# Patient Record
Sex: Female | Born: 1940 | Race: White | Hispanic: No | Smoking: Never smoker
Health system: Southern US, Community
[De-identification: ages and names within clinical notes are randomized; demographics above are authoritative.]

## PROBLEM LIST (undated history)

## (undated) DIAGNOSIS — F319 Bipolar disorder, unspecified: Secondary | ICD-10-CM

## (undated) DIAGNOSIS — K219 Gastro-esophageal reflux disease without esophagitis: Secondary | ICD-10-CM

## (undated) DIAGNOSIS — J40 Bronchitis, not specified as acute or chronic: Secondary | ICD-10-CM

## (undated) DIAGNOSIS — M549 Dorsalgia, unspecified: Secondary | ICD-10-CM

## (undated) DIAGNOSIS — M1990 Unspecified osteoarthritis, unspecified site: Secondary | ICD-10-CM

## (undated) DIAGNOSIS — J449 Chronic obstructive pulmonary disease, unspecified: Secondary | ICD-10-CM

## (undated) DIAGNOSIS — K589 Irritable bowel syndrome without diarrhea: Secondary | ICD-10-CM

## (undated) DIAGNOSIS — G8929 Other chronic pain: Secondary | ICD-10-CM

## (undated) DIAGNOSIS — Z9689 Presence of other specified functional implants: Secondary | ICD-10-CM

## (undated) DIAGNOSIS — I10 Essential (primary) hypertension: Secondary | ICD-10-CM

## (undated) DIAGNOSIS — K449 Diaphragmatic hernia without obstruction or gangrene: Secondary | ICD-10-CM

## (undated) DIAGNOSIS — F0390 Unspecified dementia without behavioral disturbance: Secondary | ICD-10-CM

## (undated) HISTORY — PX: OTHER SURGICAL HISTORY: SHX169

## (undated) HISTORY — PX: PARTIAL HYSTERECTOMY: SHX80

## (undated) HISTORY — PX: MANDIBLE SURGERY: SHX707

---

## 2009-05-13 ENCOUNTER — Emergency Department (HOSPITAL_BASED_OUTPATIENT_CLINIC_OR_DEPARTMENT_OTHER): Admission: EM | Admit: 2009-05-13 | Discharge: 2009-05-13 | Payer: Self-pay | Admitting: Emergency Medicine

## 2009-05-13 ENCOUNTER — Ambulatory Visit: Payer: Self-pay | Admitting: Diagnostic Radiology

## 2009-08-31 ENCOUNTER — Ambulatory Visit: Payer: Self-pay | Admitting: Diagnostic Radiology

## 2009-08-31 ENCOUNTER — Emergency Department (HOSPITAL_BASED_OUTPATIENT_CLINIC_OR_DEPARTMENT_OTHER): Admission: EM | Admit: 2009-08-31 | Discharge: 2009-08-31 | Payer: Self-pay | Admitting: Emergency Medicine

## 2009-10-19 ENCOUNTER — Emergency Department (HOSPITAL_BASED_OUTPATIENT_CLINIC_OR_DEPARTMENT_OTHER): Admission: EM | Admit: 2009-10-19 | Discharge: 2009-10-19 | Payer: Self-pay | Admitting: Emergency Medicine

## 2010-02-09 ENCOUNTER — Emergency Department (HOSPITAL_BASED_OUTPATIENT_CLINIC_OR_DEPARTMENT_OTHER): Admission: EM | Admit: 2010-02-09 | Discharge: 2010-02-10 | Payer: Self-pay | Admitting: Emergency Medicine

## 2010-02-09 ENCOUNTER — Ambulatory Visit: Payer: Self-pay | Admitting: Diagnostic Radiology

## 2010-07-21 ENCOUNTER — Emergency Department (HOSPITAL_BASED_OUTPATIENT_CLINIC_OR_DEPARTMENT_OTHER)
Admission: EM | Admit: 2010-07-21 | Discharge: 2010-07-21 | Disposition: A | Discharge status: Home / Self Care | Payer: Medicare Other | Attending: Emergency Medicine | Admitting: Emergency Medicine

## 2010-07-21 ENCOUNTER — Emergency Department (INDEPENDENT_AMBULATORY_CARE_PROVIDER_SITE_OTHER): Payer: Medicare Other

## 2010-07-21 DIAGNOSIS — R112 Nausea with vomiting, unspecified: Secondary | ICD-10-CM | POA: Insufficient documentation

## 2010-07-21 DIAGNOSIS — K589 Irritable bowel syndrome without diarrhea: Secondary | ICD-10-CM | POA: Insufficient documentation

## 2010-07-21 DIAGNOSIS — E860 Dehydration: Secondary | ICD-10-CM | POA: Insufficient documentation

## 2010-07-21 DIAGNOSIS — I10 Essential (primary) hypertension: Secondary | ICD-10-CM | POA: Insufficient documentation

## 2010-07-21 DIAGNOSIS — F319 Bipolar disorder, unspecified: Secondary | ICD-10-CM | POA: Insufficient documentation

## 2010-07-21 DIAGNOSIS — F0390 Unspecified dementia without behavioral disturbance: Secondary | ICD-10-CM | POA: Insufficient documentation

## 2010-07-21 DIAGNOSIS — K219 Gastro-esophageal reflux disease without esophagitis: Secondary | ICD-10-CM | POA: Insufficient documentation

## 2010-07-21 LAB — URINALYSIS, ROUTINE W REFLEX MICROSCOPIC
Ketones, ur: NEGATIVE mg/dL
Protein, ur: NEGATIVE mg/dL
Urine Glucose, Fasting: NEGATIVE mg/dL

## 2010-07-21 LAB — BASIC METABOLIC PANEL
CO2: 26 mEq/L (ref 19–32)
Chloride: 103 mEq/L (ref 96–112)
Creatinine, Ser: 0.7 mg/dL (ref 0.4–1.2)
Glucose, Bld: 155 mg/dL — ABNORMAL HIGH (ref 70–99)

## 2010-07-21 LAB — URINE MICROSCOPIC-ADD ON

## 2010-07-29 ENCOUNTER — Emergency Department (HOSPITAL_BASED_OUTPATIENT_CLINIC_OR_DEPARTMENT_OTHER)
Admission: EM | Admit: 2010-07-29 | Discharge: 2010-07-29 | Disposition: A | Discharge status: Home / Self Care | Payer: Medicare Other | Attending: Emergency Medicine | Admitting: Emergency Medicine

## 2010-07-29 DIAGNOSIS — Z79899 Other long term (current) drug therapy: Secondary | ICD-10-CM | POA: Insufficient documentation

## 2010-07-29 DIAGNOSIS — K219 Gastro-esophageal reflux disease without esophagitis: Secondary | ICD-10-CM | POA: Insufficient documentation

## 2010-07-29 DIAGNOSIS — F319 Bipolar disorder, unspecified: Secondary | ICD-10-CM | POA: Insufficient documentation

## 2010-07-29 DIAGNOSIS — I10 Essential (primary) hypertension: Secondary | ICD-10-CM | POA: Insufficient documentation

## 2010-07-29 DIAGNOSIS — K5900 Constipation, unspecified: Secondary | ICD-10-CM | POA: Insufficient documentation

## 2010-07-29 DIAGNOSIS — F0390 Unspecified dementia without behavioral disturbance: Secondary | ICD-10-CM | POA: Insufficient documentation

## 2010-07-30 ENCOUNTER — Emergency Department (HOSPITAL_BASED_OUTPATIENT_CLINIC_OR_DEPARTMENT_OTHER): Payer: Medicare Other

## 2010-07-30 ENCOUNTER — Emergency Department (INDEPENDENT_AMBULATORY_CARE_PROVIDER_SITE_OTHER): Payer: Medicare Other

## 2010-07-30 ENCOUNTER — Emergency Department (HOSPITAL_BASED_OUTPATIENT_CLINIC_OR_DEPARTMENT_OTHER)
Admission: EM | Admit: 2010-07-30 | Discharge: 2010-07-30 | Disposition: A | Discharge status: Home / Self Care | Payer: Medicare Other | Attending: Emergency Medicine | Admitting: Emergency Medicine

## 2010-07-30 DIAGNOSIS — R142 Eructation: Secondary | ICD-10-CM | POA: Insufficient documentation

## 2010-07-30 DIAGNOSIS — R911 Solitary pulmonary nodule: Secondary | ICD-10-CM | POA: Insufficient documentation

## 2010-07-30 DIAGNOSIS — R10819 Abdominal tenderness, unspecified site: Secondary | ICD-10-CM | POA: Insufficient documentation

## 2010-07-30 DIAGNOSIS — I498 Other specified cardiac arrhythmias: Secondary | ICD-10-CM | POA: Insufficient documentation

## 2010-07-30 DIAGNOSIS — Z79899 Other long term (current) drug therapy: Secondary | ICD-10-CM | POA: Insufficient documentation

## 2010-07-30 DIAGNOSIS — R109 Unspecified abdominal pain: Secondary | ICD-10-CM

## 2010-07-30 DIAGNOSIS — K5900 Constipation, unspecified: Secondary | ICD-10-CM | POA: Insufficient documentation

## 2010-07-30 DIAGNOSIS — K219 Gastro-esophageal reflux disease without esophagitis: Secondary | ICD-10-CM | POA: Insufficient documentation

## 2010-07-30 DIAGNOSIS — I10 Essential (primary) hypertension: Secondary | ICD-10-CM | POA: Insufficient documentation

## 2010-07-30 DIAGNOSIS — E785 Hyperlipidemia, unspecified: Secondary | ICD-10-CM | POA: Insufficient documentation

## 2010-07-30 DIAGNOSIS — E119 Type 2 diabetes mellitus without complications: Secondary | ICD-10-CM | POA: Insufficient documentation

## 2010-07-30 DIAGNOSIS — F0390 Unspecified dementia without behavioral disturbance: Secondary | ICD-10-CM | POA: Insufficient documentation

## 2010-07-30 DIAGNOSIS — M1990 Unspecified osteoarthritis, unspecified site: Secondary | ICD-10-CM | POA: Insufficient documentation

## 2010-07-30 DIAGNOSIS — F3130 Bipolar disorder, current episode depressed, mild or moderate severity, unspecified: Secondary | ICD-10-CM | POA: Insufficient documentation

## 2010-07-30 DIAGNOSIS — R141 Gas pain: Secondary | ICD-10-CM | POA: Insufficient documentation

## 2010-07-30 DIAGNOSIS — R110 Nausea: Secondary | ICD-10-CM | POA: Insufficient documentation

## 2010-07-30 LAB — DIFFERENTIAL
Basophils Absolute: 0 10*3/uL (ref 0.0–0.1)
Basophils Relative: 0 % (ref 0–1)
Eosinophils Relative: 1 % (ref 0–5)
Lymphocytes Relative: 21 % (ref 12–46)
Lymphs Abs: 2.2 10*3/uL (ref 0.7–4.0)

## 2010-07-30 LAB — BASIC METABOLIC PANEL
BUN: 15 mg/dL (ref 6–23)
CO2: 24 mEq/L (ref 19–32)
CO2: 28 mEq/L (ref 19–32)
Calcium: 9.2 mg/dL (ref 8.4–10.5)
Chloride: 102 mEq/L (ref 96–112)
Creatinine, Ser: 0.8 mg/dL (ref 0.4–1.2)
GFR calc Af Amer: >60 mL/min (ref >60)
GFR calc Af Amer: >60 mL/min (ref >60)
GFR calc non Af Amer: >60 mL/min (ref >60)
Glucose, Bld: 105 mg/dL — ABNORMAL HIGH (ref 70–99)
Potassium: 4.8 mEq/L (ref 3.5–5.1)
Sodium: 138 mEq/L (ref 135–145)

## 2010-07-30 LAB — CBC
HCT: 38.7 % (ref 36.0–46.0)
Platelets: 268 10*3/uL (ref 150–400)
WBC: 10.2 10*3/uL (ref 4.0–10.5)

## 2010-08-15 LAB — COMPREHENSIVE METABOLIC PANEL
AST: 34 U/L (ref 0–37)
Alkaline Phosphatase: 95 U/L (ref 39–117)
BUN: 12 mg/dL (ref 6–23)
CO2: 31 mEq/L (ref 19–32)
Calcium: 9.4 mg/dL (ref 8.4–10.5)
Creatinine, Ser: 0.7 mg/dL (ref 0.4–1.2)
GFR calc Af Amer: >60 mL/min (ref >60)
Glucose, Bld: 94 mg/dL (ref 70–99)
Total Protein: 7.6 g/dL (ref 6.0–8.3)

## 2010-08-15 LAB — DIFFERENTIAL
Basophils Absolute: 0 10*3/uL (ref 0.0–0.1)
Basophils Relative: 0 % (ref 0–1)
Monocytes Absolute: 0.6 10*3/uL (ref 0.1–1.0)
Monocytes Relative: 10 % (ref 3–12)
Neutro Abs: 3.8 10*3/uL (ref 1.7–7.7)

## 2010-08-15 LAB — URINALYSIS, ROUTINE W REFLEX MICROSCOPIC
Nitrite: NEGATIVE
Protein, ur: NEGATIVE mg/dL

## 2010-08-15 LAB — CBC
MCV: 90.3 fL (ref 78.0–100.0)
RDW: 13.4 % (ref 11.5–15.5)

## 2010-08-30 ENCOUNTER — Emergency Department (INDEPENDENT_AMBULATORY_CARE_PROVIDER_SITE_OTHER): Payer: Medicare Other

## 2010-08-30 ENCOUNTER — Emergency Department (HOSPITAL_BASED_OUTPATIENT_CLINIC_OR_DEPARTMENT_OTHER)
Admission: EM | Admit: 2010-08-30 | Discharge: 2010-08-30 | Disposition: A | Discharge status: Home / Self Care | Payer: Medicare Other | Attending: Emergency Medicine | Admitting: Emergency Medicine

## 2010-08-30 DIAGNOSIS — Z79899 Other long term (current) drug therapy: Secondary | ICD-10-CM | POA: Insufficient documentation

## 2010-08-30 DIAGNOSIS — I517 Cardiomegaly: Secondary | ICD-10-CM | POA: Insufficient documentation

## 2010-08-30 DIAGNOSIS — F411 Generalized anxiety disorder: Secondary | ICD-10-CM

## 2010-08-30 DIAGNOSIS — I10 Essential (primary) hypertension: Secondary | ICD-10-CM | POA: Insufficient documentation

## 2010-08-30 DIAGNOSIS — R0602 Shortness of breath: Secondary | ICD-10-CM | POA: Insufficient documentation

## 2010-08-30 DIAGNOSIS — K589 Irritable bowel syndrome without diarrhea: Secondary | ICD-10-CM | POA: Insufficient documentation

## 2010-08-30 DIAGNOSIS — E119 Type 2 diabetes mellitus without complications: Secondary | ICD-10-CM | POA: Insufficient documentation

## 2010-08-30 DIAGNOSIS — G8929 Other chronic pain: Secondary | ICD-10-CM | POA: Insufficient documentation

## 2010-08-30 DIAGNOSIS — F319 Bipolar disorder, unspecified: Secondary | ICD-10-CM | POA: Insufficient documentation

## 2010-08-30 LAB — DIFFERENTIAL
Eosinophils Absolute: 0.2 10*3/uL (ref 0.0–0.7)
Monocytes Absolute: 0.8 10*3/uL (ref 0.1–1.0)
Monocytes Relative: 11 % (ref 3–12)
Neutro Abs: 3 10*3/uL (ref 1.7–7.7)
Neutrophils Relative %: 43 % (ref 43–77)

## 2010-08-30 LAB — CBC
Hemoglobin: 12.4 g/dL (ref 12.0–15.0)
MCV: 88.1 fL (ref 78.0–100.0)
Platelets: 218 10*3/uL (ref 150–400)
RBC: 4.11 MIL/uL (ref 3.87–5.11)
RDW: 13.4 % (ref 11.5–15.5)
WBC: 6.8 10*3/uL (ref 4.0–10.5)

## 2010-08-30 LAB — BASIC METABOLIC PANEL
BUN: 12 mg/dL (ref 6–23)
Creatinine, Ser: 0.7 mg/dL (ref 0.4–1.2)
GFR calc Af Amer: >60 mL/min (ref >60)
Sodium: 142 mEq/L (ref 135–145)

## 2010-12-25 ENCOUNTER — Encounter: Payer: Self-pay | Admitting: *Deleted

## 2010-12-25 ENCOUNTER — Emergency Department (INDEPENDENT_AMBULATORY_CARE_PROVIDER_SITE_OTHER): Payer: Medicare Other

## 2010-12-25 ENCOUNTER — Emergency Department (HOSPITAL_BASED_OUTPATIENT_CLINIC_OR_DEPARTMENT_OTHER)
Admission: EM | Admit: 2010-12-25 | Discharge: 2010-12-25 | Disposition: A | Discharge status: Home / Self Care | Payer: Medicare Other | Attending: Emergency Medicine | Admitting: Emergency Medicine

## 2010-12-25 DIAGNOSIS — I10 Essential (primary) hypertension: Secondary | ICD-10-CM

## 2010-12-25 DIAGNOSIS — F0390 Unspecified dementia without behavioral disturbance: Secondary | ICD-10-CM | POA: Insufficient documentation

## 2010-12-25 DIAGNOSIS — K589 Irritable bowel syndrome without diarrhea: Secondary | ICD-10-CM | POA: Insufficient documentation

## 2010-12-25 DIAGNOSIS — F319 Bipolar disorder, unspecified: Secondary | ICD-10-CM | POA: Insufficient documentation

## 2010-12-25 DIAGNOSIS — J40 Bronchitis, not specified as acute or chronic: Secondary | ICD-10-CM | POA: Insufficient documentation

## 2010-12-25 DIAGNOSIS — R062 Wheezing: Secondary | ICD-10-CM

## 2010-12-25 DIAGNOSIS — E119 Type 2 diabetes mellitus without complications: Secondary | ICD-10-CM | POA: Insufficient documentation

## 2010-12-25 DIAGNOSIS — R05 Cough: Secondary | ICD-10-CM

## 2010-12-25 DIAGNOSIS — R0602 Shortness of breath: Secondary | ICD-10-CM

## 2010-12-25 DIAGNOSIS — K219 Gastro-esophageal reflux disease without esophagitis: Secondary | ICD-10-CM | POA: Insufficient documentation

## 2010-12-25 HISTORY — DX: Bipolar disorder, unspecified: F31.9

## 2010-12-25 HISTORY — DX: Irritable bowel syndrome, unspecified: K58.9

## 2010-12-25 HISTORY — DX: Essential (primary) hypertension: I10

## 2010-12-25 HISTORY — DX: Gastro-esophageal reflux disease without esophagitis: K21.9

## 2010-12-25 HISTORY — DX: Diaphragmatic hernia without obstruction or gangrene: K44.9

## 2010-12-25 HISTORY — DX: Unspecified osteoarthritis, unspecified site: M19.90

## 2010-12-25 HISTORY — DX: Unspecified dementia, unspecified severity, without behavioral disturbance, psychotic disturbance, mood disturbance, and anxiety: F03.90

## 2010-12-25 MED ORDER — METHYLPREDNISOLONE SODIUM SUCC 125 MG IJ SOLR: 125.0000 mg | Freq: Once | INTRAMUSCULAR | Status: DC

## 2010-12-25 MED ORDER — PREDNISONE 20 MG PO TABS: 60.0000 mg | ORAL_TABLET | Freq: Every day | ORAL | Status: AC

## 2010-12-25 MED ORDER — AZITHROMYCIN 250 MG PO TABS: 250.0000 mg | ORAL_TABLET | Freq: Every day | ORAL | Status: DC

## 2010-12-25 MED ORDER — IPRATROPIUM BROMIDE 0.02 % IN SOLN
0.5000 mg | RESPIRATORY_TRACT | Status: DC
  Administered 2010-12-25: 0.5 mg via RESPIRATORY_TRACT

## 2010-12-25 MED ORDER — AZITHROMYCIN 250 MG PO TABS
500.0000 mg | ORAL_TABLET | Freq: Once | ORAL | Status: AC
  Administered 2010-12-25: 500 mg via ORAL

## 2010-12-25 MED ORDER — ALBUTEROL SULFATE (5 MG/ML) 0.5% IN NEBU
INHALATION_SOLUTION | RESPIRATORY_TRACT | Status: AC
  Administered 2010-12-25: 5 mg via RESPIRATORY_TRACT
  Filled 2010-12-25: qty 1

## 2010-12-25 MED ORDER — HYDROCORTISONE 2.5 % RE CREA: TOPICAL_CREAM | RECTAL | Status: AC

## 2010-12-25 MED ORDER — PREDNISONE 20 MG PO TABS
60.0000 mg | ORAL_TABLET | Freq: Once | ORAL | Status: AC
  Administered 2010-12-25: 60 mg via ORAL

## 2010-12-25 MED ORDER — IPRATROPIUM BROMIDE 0.02 % IN SOLN
RESPIRATORY_TRACT | Status: AC
  Administered 2010-12-25: 0.5 mg via RESPIRATORY_TRACT
  Filled 2010-12-25: qty 2.5

## 2010-12-25 MED ORDER — ALBUTEROL SULFATE (5 MG/ML) 0.5% IN NEBU
5.0000 mg | INHALATION_SOLUTION | RESPIRATORY_TRACT | Status: DC
  Administered 2010-12-25: 5 mg via RESPIRATORY_TRACT

## 2010-12-25 NOTE — ED Provider Notes (Signed)
History     Chief Complaint  Patient presents with  . Shortness of Breath   The history is provided by the patient.   Pt with long history of breathing problems, reports 2-3 days of increasing SOB, cough and wheezing, not improved with inhalers at assisted living facility. Given albuterol neb enroute with some improvement. No fever, minimal sputum production.  Past Medical History  Diagnosis Date  . Hypertension   . GERD (gastroesophageal reflux disease)   . Dementia   . IBS (irritable bowel syndrome)   . Arthritis   . Constipation   . Hiatal hernia   . Bipolar 1 disorder   . Diabetes mellitus     History reviewed. No pertinent past surgical history.  No family history on file.  History  Substance Use Topics  . Smoking status: Never Smoker   . Smokeless tobacco: Not on file  . Alcohol Use: No    OB History    Grav Para Term Preterm Abortions TAB SAB Ect Mult Living                  Review of Systems  All other systems reviewed and are negative.    Physical Exam  BP 147/73  Pulse 60  Temp(Src) 98.6 F (37 C) (Oral)  SpO2 100%  Physical Exam  Nursing note and vitals reviewed. Constitutional: She is oriented to person, place, and time. She appears well-developed and well-nourished.  HENT:  Head: Normocephalic and atraumatic.  Eyes: EOM are normal. Pupils are equal, round, and reactive to light.  Neck: Normal range of motion. Neck supple.  Cardiovascular: Normal rate, normal heart sounds and intact distal pulses.   Pulmonary/Chest: Effort normal. No respiratory distress. She has wheezes. She has no rales.  Abdominal: Bowel sounds are normal. She exhibits no distension. There is no tenderness.  Musculoskeletal: Normal range of motion. She exhibits no edema and no tenderness.  Neurological: She is alert and oriented to person, place, and time. No cranial nerve deficit.  Skin: Skin is warm and dry. No rash noted.  Psychiatric: She has a normal mood and  affect.    ED Course  Procedures  MDM Pt feeling much better and ready to go home. Asking for refill on hemorrhoid cream.       Charles B. Bernette Mayers, MD 12/25/10 [id number]

## 2010-12-25 NOTE — ED Notes (Signed)
Per EMS pt from [facility]. Pt alert and oriented. Pt c/o shortness of breath x 3 days. Productive cough with yellow sputum. Expiratory wheezing on EMS arrival. Albuterol 5 given PTA. IV 20G to left arm infiltrated.

## 2010-12-25 NOTE — ED Notes (Signed)
Called ptar to transfer [facility]

## 2011-01-07 ENCOUNTER — Other Ambulatory Visit: Payer: Self-pay

## 2011-01-07 ENCOUNTER — Emergency Department (HOSPITAL_BASED_OUTPATIENT_CLINIC_OR_DEPARTMENT_OTHER)
Admission: EM | Admit: 2011-01-07 | Discharge: 2011-01-07 | Disposition: A | Discharge status: Home / Self Care | Payer: Medicare Other | Attending: Emergency Medicine | Admitting: Emergency Medicine

## 2011-01-07 ENCOUNTER — Emergency Department (INDEPENDENT_AMBULATORY_CARE_PROVIDER_SITE_OTHER): Payer: Medicare Other

## 2011-01-07 ENCOUNTER — Encounter (HOSPITAL_BASED_OUTPATIENT_CLINIC_OR_DEPARTMENT_OTHER): Payer: Self-pay | Admitting: Student

## 2011-01-07 DIAGNOSIS — R0602 Shortness of breath: Secondary | ICD-10-CM

## 2011-01-07 DIAGNOSIS — R609 Edema, unspecified: Secondary | ICD-10-CM

## 2011-01-07 DIAGNOSIS — R748 Abnormal levels of other serum enzymes: Secondary | ICD-10-CM

## 2011-01-07 DIAGNOSIS — J40 Bronchitis, not specified as acute or chronic: Secondary | ICD-10-CM

## 2011-01-07 DIAGNOSIS — IMO0001 Reserved for inherently not codable concepts without codable children: Secondary | ICD-10-CM

## 2011-01-07 DIAGNOSIS — R0989 Other specified symptoms and signs involving the circulatory and respiratory systems: Secondary | ICD-10-CM

## 2011-01-07 DIAGNOSIS — R05 Cough: Secondary | ICD-10-CM

## 2011-01-07 DIAGNOSIS — R079 Chest pain, unspecified: Secondary | ICD-10-CM

## 2011-01-07 LAB — BASIC METABOLIC PANEL
BUN: 12 mg/dL (ref 6–23)
CO2: 24 mEq/L (ref 19–32)
Chloride: 96 mEq/L (ref 96–112)
Glucose, Bld: 109 mg/dL — ABNORMAL HIGH (ref 70–99)
Potassium: 4.5 mEq/L (ref 3.5–5.1)

## 2011-01-07 LAB — D-DIMER, QUANTITATIVE: D-Dimer, Quant: 0.77 ug/mL-FEU — ABNORMAL HIGH (ref 0.00–0.48)

## 2011-01-07 LAB — CBC
MCH: 29.4 pg (ref 26.0–34.0)
Platelets: 239 10*3/uL (ref 150–400)
RBC: 3.98 MIL/uL (ref 3.87–5.11)
WBC: 7.7 10*3/uL (ref 4.0–10.5)

## 2011-01-07 LAB — CARDIAC PANEL(CRET KIN+CKTOT+MB+TROPI)
CK, MB: 3.7 ng/mL (ref 0.3–4.0)
Relative Index: INVALID (ref 0.0–2.5)
Troponin I: <0.3 ng/mL (ref <0.30)

## 2011-01-07 LAB — PRO B NATRIURETIC PEPTIDE: Pro B Natriuretic peptide (BNP): 59 pg/mL (ref 0–125)

## 2011-01-07 MED ORDER — METHYLPREDNISOLONE SODIUM SUCC 125 MG IJ SOLR
125.0000 mg | Freq: Once | INTRAMUSCULAR | Status: AC
  Administered 2011-01-07: 125 mg via INTRAVENOUS
  Filled 2011-01-07: qty 2

## 2011-01-07 MED ORDER — IPRATROPIUM BROMIDE 0.02 % IN SOLN
0.5000 mg | Freq: Once | RESPIRATORY_TRACT | Status: AC
  Administered 2011-01-07: 0.5 mg via RESPIRATORY_TRACT
  Filled 2011-01-07: qty 2.5

## 2011-01-07 MED ORDER — IOHEXOL 350 MG/ML SOLN
80.0000 mL | Freq: Once | INTRAVENOUS | Status: AC | PRN
  Administered 2011-01-07: 80 mL via INTRAVENOUS

## 2011-01-07 MED ORDER — FUROSEMIDE 40 MG PO TABS: 40.0000 mg | ORAL_TABLET | Freq: Every day | ORAL | Status: DC

## 2011-01-07 MED ORDER — ALBUTEROL SULFATE (5 MG/ML) 0.5% IN NEBU
2.5000 mg | INHALATION_SOLUTION | Freq: Once | RESPIRATORY_TRACT | Status: AC
  Administered 2011-01-07: 2.5 mg via RESPIRATORY_TRACT
  Filled 2011-01-07: qty 0.5

## 2011-01-07 MED ORDER — FUROSEMIDE 10 MG/ML IJ SOLN
40.0000 mg | Freq: Once | INTRAMUSCULAR | Status: AC
  Administered 2011-01-07: 40 mg via INTRAVENOUS
  Filled 2011-01-07: qty 4

## 2011-01-07 MED ORDER — DIPHENHYDRAMINE HCL 50 MG/ML IJ SOLN
50.0000 mg | Freq: Once | INTRAMUSCULAR | Status: AC
  Administered 2011-01-07: 50 mg via INTRAVENOUS
  Filled 2011-01-07: qty 1

## 2011-01-07 MED ORDER — ALBUTEROL SULFATE HFA 108 (90 BASE) MCG/ACT IN AERS: 2.0000 | INHALATION_SPRAY | RESPIRATORY_TRACT | Status: DC | PRN

## 2011-01-07 NOTE — ED Notes (Signed)
Pt sts she thinks she "may be allergic to iodine and contrast".

## 2011-01-07 NOTE — ED Notes (Signed)
CT called and pt will be taken for procedure at 1530.

## 2011-01-07 NOTE — ED Notes (Signed)
Pt speaking in full sentences, expiratory wheezing improved. nad noted. Pt on bedside monitor.

## 2011-01-07 NOTE — ED Notes (Signed)
PTAR called for transport to [facility] 

## 2011-01-07 NOTE — ED Notes (Signed)
Pt is from [facility], pt c/o "tightness in chest and difficulty breathing".  Pt has audible wheeze, able to speak in complete sentences. nad noted at this time.

## 2011-01-07 NOTE — ED Provider Notes (Signed)
History     CSN: [id number] Arrival date & time: 01/07/2011  9:21 AM  Chief Complaint  Patient presents with  . Shortness of Breath   Patient is a 70 y.o. female presenting with shortness of breath. The history is provided by the patient.  Shortness of Breath  Episode onset: Symptoms started three days ago. The onset was gradual. The problem occurs continuously. The problem has been gradually worsening. The problem is moderate. The symptoms are relieved by beta-agonist inhalers. The symptoms are aggravated by a supine position. Associated symptoms include chest pressure, orthopnea, rhinorrhea, cough and shortness of breath. Pertinent negatives include no fever. The cough is non-productive. The rhinorrhea has been occurring frequently. The nasal discharge has a clear appearance. Urine output has been normal.  She has had post-nasal drainage. This morning she noted increased dyspnea while trying to eat breakfast. She has gotten slight, transient relief from using inhalers.  Past Medical History  Diagnosis Date  . Hypertension   . GERD (gastroesophageal reflux disease)   . Dementia   . IBS (irritable bowel syndrome)   . Arthritis   . Constipation   . Hiatal hernia   . Bipolar 1 disorder   . Diabetes mellitus     History reviewed. No pertinent past surgical history.  History reviewed. No pertinent family history.  History  Substance Use Topics  . Smoking status: Never Smoker   . Smokeless tobacco: Not on file  . Alcohol Use: No    OB History    Grav Para Term Preterm Abortions TAB SAB Ect Mult Living                  Review of Systems  Constitutional: Negative for fever.  HENT: Positive for rhinorrhea.   Respiratory: Positive for cough and shortness of breath.   Cardiovascular: Positive for orthopnea.  All other systems reviewed and are negative.    Physical Exam  BP 147/78  Pulse 62  Temp(Src) 98 F (36.7 C) (Oral)  Resp 22  Ht 5' (1.524 m)  Wt 183 lb (83.008  kg)  BMI 35.74 kg/m2  SpO2 100%  Physical Exam  Nursing note and vitals reviewed. Constitutional: She is oriented to person, place, and time. She appears well-developed and well-nourished.       She is mildly dyspneic.  HENT:  Head: Normocephalic and atraumatic.  Right Ear: External ear normal.  Left Ear: External ear normal.  Nose: Nose normal.  Mouth/Throat: Oropharynx is clear and moist.  Eyes: Conjunctivae and EOM are normal. Pupils are equal, round, and reactive to light. No scleral icterus.  Neck: Normal range of motion. Neck supple. No JVD present.       Expiratory wheeze heard over the thyroid cartilage.  Cardiovascular: Normal rate, regular rhythm and normal heart sounds.   No murmur heard. Pulmonary/Chest: She has no rales.       Transmitted upper airway wheeze, no prolonged exhalation  Abdominal: Soft. Bowel sounds are normal. She exhibits no mass. There is no tenderness.  Musculoskeletal: Normal range of motion. She exhibits edema.       2 pretibial edema.  Lymphadenopathy:    She has no cervical adenopathy.  Neurological: She is alert and oriented to person, place, and time. No cranial nerve deficit. Coordination normal.  Skin: Skin is warm and dry. No rash noted.  Psychiatric: She has a normal mood and affect.    ED Course  Procedures ECG shows normal sinus bradycardia with a rate of 53, no  ectopy. Normal axis. Normal P wave. Normal QRS. Normal intervals. Normal ST and T waves. Impression: normal ECG except for sinus bradycardia. ECG is unchanged from tracing 452012.  MDM Dyspnea more likely from CHF than bronchospasm She feels a little better after Albuterol/Atrovent NMT. BNP is normal, but still might have a component of fluid overload making mild bronchospasm more symptomatic. Will send home on Lasix. She states she has psychiatric side effects to Prednisone, so will not give steroids.  Labs and x-rays reviewed, images viewed by me.  Results for orders placed  during the hospital encounter of 01/07/11  CBC      Component Value Range   WBC 7.7  4.0 - 10.5 (K/uL)   RBC 3.98  3.87 - 5.11 (MIL/uL)   Hemoglobin 11.7 (*) 12.0 - 15.0 (g/dL)   HCT 46.9 (*) 62.9 - 46.0 (%)   MCV 88.2  78.0 - 100.0 (fL)   MCH 29.4  26.0 - 34.0 (pg)   MCHC 33.3  30.0 - 36.0 (g/dL)   RDW 52.8  41.3 - 24.4 (%)   Platelets 239  150 - 400 (K/uL)  BASIC METABOLIC PANEL      Component Value Range   Sodium 132 (*) 135 - 145 (mEq/L)   Potassium 4.5  3.5 - 5.1 (mEq/L)   Chloride 96  96 - 112 (mEq/L)   CO2 24  19 - 32 (mEq/L)   Glucose, Bld 109 (*) 70 - 99 (mg/dL)   BUN 12  6 - 23 (mg/dL)   Creatinine, Ser 0.10  0.50 - 1.10 (mg/dL)   Calcium 9.5  8.4 - 27.2 (mg/dL)   GFR calc non Af Amer >60  >60 (mL/min)   GFR calc Af Amer >60  >60 (mL/min)  CARDIAC PANEL(CRET KIN+CKTOT+MB+TROPI)      Component Value Range   Total CK 81  7 - 177 (U/L)   CK, MB 3.7  0.3 - 4.0 (ng/mL)   Troponin I <0.30  <0.30 (ng/mL)   Relative Index RELATIVE INDEX IS INVALID  0.0 - 2.5   D-DIMER, QUANTITATIVE      Component Value Range   D-Dimer, Quant 0.77 (*) 0.00 - 0.48 (ug/mL-FEU)  PRO B NATRIURETIC PEPTIDE      Component Value Range   BNP, POC 59.0  0 - 125 (pg/mL)   Dg Chest 2 View  01/07/2011  *RADIOLOGY REPORT*  Clinical Data: Shortness of breath and cough.  Chest pain.  CHEST - 2 VIEW  Comparison: 12/25/2010 and 05/13/2009  Findings: The cardiomediastinal silhouette is unremarkable. Elevation of the right hemidiaphragm is unchanged. A new nodular opacity overlying the mid - lower right lung is noted. There is no evidence of airspace disease, pleural effusion or pneumothorax. A neurostimulator generator pack overlying the left chest is again noted.  IMPRESSION: Nodular opacity overlying the mid - lower right lung.  This may be bony, but as this is new, chest CT with contrast is recommended for further evaluation.  Original Report Authenticated By: Rosendo Gros, M.D.   Dg Chest 2  View  12/25/2010  *RADIOLOGY REPORT*  Clinical Data: Shortness of breath.  Productive coughing. Wheezing.  History of hypertension.  Hiatal hernia.  Diabetes.  CHEST - 2 VIEW  Comparison: 08/30/2010.  Findings: The left-sided spinal stimulator device is present anteriorly on the left. Its lead extends into the left lower neck. There is moderate enlargement of the cardiac silhouette. Ectasia and nonaneurysmal calcification of the thoracic aorta are seen. There is chronic elevation of  the anterior aspect of the right hemidiaphragm.  No pulmonary edema, pneumonia, or pleural effusion is seen.  On the lateral image there appears to be some central peribronchial thickening.  There is osteopenic appearance of the bones with degenerative spondylosis.  IMPRESSION: Moderate enlargement of the cardiac silhouette.  Chronic elevation of anterior aspect of right hemidiaphragm.  No peripheral pneumonia is evident.  Central peribronchial thickening.  This may be associated with bronchitis, asthma, and reactive airway disease.  Original Report Authenticated By: Crawford Givens, M.D.   Ct Angio Chest W/cm &/or Wo Cm  01/07/2011  *RADIOLOGY REPORT*  Clinical Data:  Dyspnea, chest pain, elevated D-dimer, evaluate for PE  CT ANGIOGRAPHY CHEST WITH CONTRAST  Technique:  Multidetector CT imaging of the chest was performed using the standard protocol during bolus administration of intravenous contrast.  Multiplanar CT image reconstructions including MIPs were obtained to evaluate the vascular anatomy.  Contrast:  80 ml Omnipaque-300 IV  Comparison:  Chest radiograph dated 01/07/2011  Findings:  No evidence of pulmonary embolism.  Mild dependent atelectasis in the lower lobes.  Opacity on the chest radiograph likely corresponds to subsegmental atelectasis in the right middle lobe (series 6/image 40).  No suspicious pulmonary nodules. No pleural effusion or pneumothorax.  Visualized thyroid is unremarkable.  Heart is within normal limits.   Mild atherosclerotic calcifications of the aortic arch.  No suspicious mediastinal, hilar, or axillary lymphadenopathy.  Visualized upper abdomen is notable for a small hiatal hernia, vascular calcifications, and multiple fluid density lesions in the liver which are likely benign cysts.  Mild degenerative changes of the visualized thoracolumbar spine. Battery pack in the left chest wall, possibly for a stimulator device.  Review of the MIP images confirms the above findings.  IMPRESSION: No evidence of pulmonary embolism.  Opacity on the chest radiograph likely corresponds to subsegmental atelectasis in the right middle lobe.  No suspicious pulmonary nodules.  Original Report Authenticated By: Charline Bills, M.D.      Dione Booze, MD 01/07/11 [phone]

## 2011-01-07 NOTE — ED Notes (Signed)
Pt in via [facility] from [facility] with c/o SOB x 3 days. Airway patent and intact, per EMS report pt sat at scene 99% on RA. No cyanosis noted. RN staff at SNF reports cough and congestion. Pt able to mobilize secretions and maintain patent airway.

## 2011-01-21 ENCOUNTER — Other Ambulatory Visit: Payer: Self-pay

## 2011-01-21 ENCOUNTER — Emergency Department (HOSPITAL_BASED_OUTPATIENT_CLINIC_OR_DEPARTMENT_OTHER)
Admission: EM | Admit: 2011-01-21 | Discharge: 2011-01-21 | Disposition: A | Discharge status: Home / Self Care | Payer: Medicare Other | Attending: Emergency Medicine | Admitting: Emergency Medicine

## 2011-01-21 ENCOUNTER — Encounter (HOSPITAL_BASED_OUTPATIENT_CLINIC_OR_DEPARTMENT_OTHER): Payer: Self-pay | Admitting: Emergency Medicine

## 2011-01-21 ENCOUNTER — Emergency Department (INDEPENDENT_AMBULATORY_CARE_PROVIDER_SITE_OTHER): Payer: Medicare Other

## 2011-01-21 DIAGNOSIS — R0989 Other specified symptoms and signs involving the circulatory and respiratory systems: Secondary | ICD-10-CM | POA: Insufficient documentation

## 2011-01-21 DIAGNOSIS — R0602 Shortness of breath: Secondary | ICD-10-CM

## 2011-01-21 DIAGNOSIS — IMO0001 Reserved for inherently not codable concepts without codable children: Secondary | ICD-10-CM

## 2011-01-21 DIAGNOSIS — R05 Cough: Secondary | ICD-10-CM

## 2011-01-21 DIAGNOSIS — R0609 Other forms of dyspnea: Secondary | ICD-10-CM | POA: Insufficient documentation

## 2011-01-21 MED ORDER — GI COCKTAIL ~~LOC~~
30.0000 mL | Freq: Once | ORAL | Status: AC
  Administered 2011-01-21: 30 mL via ORAL
  Filled 2011-01-21: qty 30

## 2011-01-21 MED ORDER — IPRATROPIUM BROMIDE 0.02 % IN SOLN
0.5000 mg | Freq: Once | RESPIRATORY_TRACT | Status: AC
  Administered 2011-01-21: 0.5 mg via RESPIRATORY_TRACT
  Filled 2011-01-21: qty 2.5

## 2011-01-21 MED ORDER — BENZONATATE 100 MG PO CAPS: 100.0000 mg | ORAL_CAPSULE | Freq: Three times a day (TID) | ORAL | Status: AC

## 2011-01-21 MED ORDER — GI COCKTAIL ~~LOC~~: 30.0000 mL | Freq: Once | ORAL | Status: DC

## 2011-01-21 MED ORDER — ALBUTEROL SULFATE (5 MG/ML) 0.5% IN NEBU
5.0000 mg | INHALATION_SOLUTION | Freq: Once | RESPIRATORY_TRACT | Status: AC
  Administered 2011-01-21: 5 mg via RESPIRATORY_TRACT
  Filled 2011-01-21: qty 1

## 2011-01-21 NOTE — ED Notes (Signed)
PTAR here for pt transport back to [facility].

## 2011-01-21 NOTE — ED Notes (Signed)
Pt returned from radiology, NAD noted. 

## 2011-01-21 NOTE — ED Notes (Signed)
Sent from [facility] for sob.   Cough ,wheezing

## 2011-01-21 NOTE — ED Notes (Signed)
Pt report called and given to Ms Manson Passey at [facility].

## 2011-01-21 NOTE — ED Provider Notes (Signed)
Scribed for [employer]. Bernette Mayers, MD, the patient was seen in room 8 This chart was scribed by Jannette Fogo and Hillery Hunter. This patient's care was started at 19:41.   CSN: [id number] Arrival date & time: 01/21/2011  7:09 PM  Chief Complaint  Patient presents with  . Shortness of Breath    1 albuterol given by EMS PTA   HPI Cindy Padilla is a 70 y.o. female brought in by ambulance from [facility], who presents to the Emergency Department complaining of ~3 days of cough and shortness of breath. Patient states she developed a cough with "pale yellow" sputum 2 days ago. She reports associated shortness of breath, chest congestion, and chest tightness. Symptoms are exacerbated by coughing and states she "feels a tearing sensation in the chest". Patient took Albuterol today at 16:30PM and reports mild improvement.  Additionally, patient was seen in the ED on 01/07/11 by Dr. Preston Fleeting for dyspnea more likely from CHF than bronchospasm. She was treated with a 15 day course of Lasix and has 3 days left. Patient reports persistent lower extremity swelling despite being on Lasix but she also reports drinking a lot of water. There are no other associated symptoms and no other alleviating or aggravating factors.    HPI ELEMENTS:  Location: chest  Onset: 2 days ago Duration: 3 days  Quality: "tearing" sensation   Modifying factors: exacerbated by coughing   Context: as above  Associated symptoms: as above    Past Medical History  Diagnosis Date  . Hypertension   . GERD (gastroesophageal reflux disease)   . Dementia   . IBS (irritable bowel syndrome)   . Arthritis   . Constipation   . Hiatal hernia   . Bipolar 1 disorder   . Diabetes mellitus      PAST SURGICAL HISTORY:  History reviewed. No pertinent past surgical history.   MEDICATIONS:  Previous Medications   ACETAMINOPHEN (TYLENOL) 325 MG TABLET    Take 650 mg by mouth every 6 (six) hours as needed. Pain or fever     ALBUTEROL (PROVENTIL HFA;VENTOLIN HFA) 108 (90 BASE) MCG/ACT INHALER    Inhale 2 puffs into the lungs every 4 (four) hours as needed for wheezing.   ALBUTEROL (PROVENTIL,VENTOLIN) 90 MCG/ACT INHALER    Inhale 2 puffs into the lungs every 4 (four) hours as needed. Wheezing and shortness of breath    AZITHROMYCIN (ZITHROMAX Z-PAK) 250 MG TABLET    Take 1 tablet (250 mg total) by mouth daily.   CARVEDILOL (COREG) 6.25 MG TABLET    Take 6.25 mg by mouth 2 (two) times daily with a meal.     CHOLECALCIFEROL (VITAMIN D) 1000 UNITS CAPSULE    Take 1,000 Units by mouth daily.     DICYCLOMINE (BENTYL) 10 MG CAPSULE    Take 10 mg by mouth 2 (two) times daily.     DOCUSATE SODIUM (COLACE) 100 MG CAPSULE    Take 100 mg by mouth daily.     DONEPEZIL (ARICEPT) 10 MG TABLET    Take 10 mg by mouth at bedtime.     FLUTICASONE (FLONASE) 50 MCG/ACT NASAL SPRAY    Place 2 sprays into the nose daily.     FLUTICASONE (FLOVENT HFA) 220 MCG/ACT INHALER    Inhale 2 puffs into the lungs at bedtime.     FUROSEMIDE (LASIX) 40 MG TABLET    Take 1 tablet (40 mg total) by mouth daily.   HYDROCORTISONE 2.5 % OINTMENT  Apply topically as needed. Affected areas     LAMOTRIGINE (LAMICTAL) 100 MG TABLET    Take 100 mg by mouth daily.     LAMOTRIGINE (LAMICTAL) 100 MG TABLET    Take 200 mg by mouth every evening.     LISINOPRIL (PRINIVIL,ZESTRIL) 40 MG TABLET    Take 40 mg by mouth daily.     LORATADINE (CLARITIN) 10 MG TABLET    Take 10 mg by mouth daily.     LORAZEPAM (ATIVAN) 0.5 MG TABLET    Take 1 mg by mouth 3 (three) times daily.     LORAZEPAM (ATIVAN) 1 MG TABLET    Take 1 mg by mouth every 6 (six) hours as needed. anxiety    PANTOPRAZOLE (PROTONIX) 40 MG TABLET    Take 40 mg by mouth 2 (two) times daily.     POLYETHYLENE GLYCOL (MIRALAX / GLYCOLAX) PACKET    Take 17 g by mouth daily.     PRAVASTATIN (PRAVACHOL) 40 MG TABLET    Take 40 mg by mouth daily.     QUETIAPINE (SEROQUEL XR) 50 MG TB24    Take 100 mg by mouth  daily.     QUETIAPINE (SEROQUEL) 50 MG TABLET    Take 50 mg by mouth every morning.     TRAMADOL (ULTRAM) 50 MG TABLET    Take 50 mg by mouth every 6 (six) hours as needed. Pain     VERAPAMIL (CALAN-SR) 180 MG CR TABLET    Take 180 mg by mouth daily.       ALLERGIES:  Allergies as of 01/21/2011 - Review Complete 01/21/2011  Allergen Reaction Noted  . Aspirin  01/07/2011  . Codeine  01/07/2011  . Ivp dye (iodinated diagnostic agents)  01/07/2011  . Morphine and related  12/25/2010  . Neomycin  01/07/2011  . Penicillins  12/25/2010  . Promethazine  12/25/2010  . Sulfa antibiotics  12/25/2010  . Tetracyclines & related  12/25/2010     FAMILY HISTORY:  No Pertinent Family History   SOCIAL HISTORY: Brought in by ambulance from [facility]  History  Substance Use Topics  . Smoking status: Never Smoker   . Smokeless tobacco: Not on file  . Alcohol Use: No     Review of Systems  Respiratory: Positive for cough, chest tightness and shortness of breath.   Cardiovascular: Positive for leg swelling.  All other systems reviewed and are negative.    Physical Exam  SpO2 96%  Physical Exam  Constitutional: She is oriented to person, place, and time. She appears well-developed and well-nourished.  HENT:  Head: Normocephalic and atraumatic.  Mouth/Throat: Oropharynx is clear and moist.  Eyes: Conjunctivae are normal. Pupils are equal, round, and reactive to light.  Neck: Normal range of motion. Neck supple. No thyromegaly present.  Cardiovascular: Normal rate, regular rhythm, normal heart sounds and intact distal pulses.   No murmur heard. Pulmonary/Chest: Effort normal and breath sounds normal. No stridor. She has no wheezes.       Upper airway noises   Abdominal: Soft. Bowel sounds are normal. She exhibits no distension. There is no tenderness.  Musculoskeletal: Normal range of motion. She exhibits edema (mild edema to bilateral shins ). She exhibits no tenderness.    Neurological: She is alert and oriented to person, place, and time. Coordination normal.  Skin: Skin is warm and dry. No rash noted.  Psychiatric: She has a normal mood and affect.    ED Course  Procedures  OTHER DATA REVIEWED: Nursing notes, vital signs, and past medical records reviewed.   DIAGNOSTIC STUDIES: Oxygen Saturation is 96% on room air, normal by my interpretation.    RADIOLOGY:  CXR: 2 View; Interpreted by Radiologist and reviewed by me:  Comparison: 01/07/2011 CT  IMPRESSION: No acute cardiopulmonary process.  Original Report Authenticated By: Waneta Martins, M.D.   ED COURSE / COORDINATION OF CARE: 19:45  - Patient evaluated by ED physician; CXR and Albuterol ordered.  21:50. Ordered GI cocktail  MDM: Pt with chronic SOB with multiple ED visits for same. Had extensive workup about 2 weeks ago including CTA chest. Given Lasix then, but no improvement. Her wheezing is volitional with normal breath sounds when distracted. She is complaining of a burning in her chest, likely GERD related. No concern for ACS. Given GI cocktail. Advised to continue inhalers at [facility] [place] for cough and PCP followup.    Date: 01/21/2011  Rate: 79  Rhythm: normal sinus rhythm  QRS Axis: normal  Intervals: normal  ST/T Wave abnormalities: normal  Conduction Disutrbances:none  Narrative Interpretation:   Old EKG Reviewed: no significant changes   SCRIBE ATTESTATION: I personally performed the services described in the documentation, which were scribed in my presence. The recorded information has been reviewed and considered.   SHELDON,CHARLES B.        Charles B. Bernette Mayers, MD 01/21/11 2200

## 2011-01-21 NOTE — ED Notes (Signed)
Pt ambulated to restroom with assistance. NAD noted.

## 2011-01-21 NOTE — ED Notes (Signed)
D/C IV per RN , Cath intact

## 2011-01-21 NOTE — ED Notes (Signed)
ECG done and given to EDP for confirmation  

## 2011-01-21 NOTE — ED Notes (Signed)
Call placed for pt transport back to [facility]. Pt made aware.

## 2011-01-21 NOTE — ED Notes (Signed)
Report rec'd from S. Elwyn Reach, RN, assumed pt care at this time. RT at bs, neb tx in progress.

## 2011-01-31 ENCOUNTER — Encounter (HOSPITAL_BASED_OUTPATIENT_CLINIC_OR_DEPARTMENT_OTHER): Payer: Self-pay | Admitting: *Deleted

## 2011-01-31 ENCOUNTER — Emergency Department (INDEPENDENT_AMBULATORY_CARE_PROVIDER_SITE_OTHER): Payer: Medicare Other

## 2011-01-31 ENCOUNTER — Emergency Department (HOSPITAL_BASED_OUTPATIENT_CLINIC_OR_DEPARTMENT_OTHER)
Admission: EM | Admit: 2011-01-31 | Discharge: 2011-01-31 | Disposition: A | Discharge status: Home / Self Care | Payer: Medicare Other | Attending: Emergency Medicine | Admitting: Emergency Medicine

## 2011-01-31 ENCOUNTER — Other Ambulatory Visit: Payer: Self-pay

## 2011-01-31 DIAGNOSIS — F319 Bipolar disorder, unspecified: Secondary | ICD-10-CM | POA: Insufficient documentation

## 2011-01-31 DIAGNOSIS — J4489 Other specified chronic obstructive pulmonary disease: Secondary | ICD-10-CM | POA: Insufficient documentation

## 2011-01-31 DIAGNOSIS — F0390 Unspecified dementia without behavioral disturbance: Secondary | ICD-10-CM | POA: Insufficient documentation

## 2011-01-31 DIAGNOSIS — K219 Gastro-esophageal reflux disease without esophagitis: Secondary | ICD-10-CM | POA: Insufficient documentation

## 2011-01-31 DIAGNOSIS — I509 Heart failure, unspecified: Secondary | ICD-10-CM

## 2011-01-31 DIAGNOSIS — K5900 Constipation, unspecified: Secondary | ICD-10-CM | POA: Insufficient documentation

## 2011-01-31 DIAGNOSIS — R0602 Shortness of breath: Secondary | ICD-10-CM | POA: Insufficient documentation

## 2011-01-31 DIAGNOSIS — E119 Type 2 diabetes mellitus without complications: Secondary | ICD-10-CM | POA: Insufficient documentation

## 2011-01-31 DIAGNOSIS — J449 Chronic obstructive pulmonary disease, unspecified: Secondary | ICD-10-CM | POA: Insufficient documentation

## 2011-01-31 DIAGNOSIS — I10 Essential (primary) hypertension: Secondary | ICD-10-CM | POA: Insufficient documentation

## 2011-01-31 DIAGNOSIS — I517 Cardiomegaly: Secondary | ICD-10-CM

## 2011-01-31 DIAGNOSIS — R05 Cough: Secondary | ICD-10-CM

## 2011-01-31 HISTORY — DX: Dorsalgia, unspecified: M54.9

## 2011-01-31 HISTORY — DX: Bronchitis, not specified as acute or chronic: J40

## 2011-01-31 HISTORY — DX: Other chronic pain: G89.29

## 2011-01-31 LAB — DIFFERENTIAL
Basophils Absolute: 0 10*3/uL (ref 0.0–0.1)
Basophils Relative: 0 % (ref 0–1)
Lymphocytes Relative: 38 % (ref 12–46)
Neutro Abs: 3.2 10*3/uL (ref 1.7–7.7)

## 2011-01-31 LAB — COMPREHENSIVE METABOLIC PANEL
ALT: 15 U/L (ref 0–35)
AST: 23 U/L (ref 0–37)
Albumin: 3.9 g/dL (ref 3.5–5.2)
CO2: 27 mEq/L (ref 19–32)
Calcium: 9.8 mg/dL (ref 8.4–10.5)
Chloride: 99 mEq/L (ref 96–112)
Creatinine, Ser: 0.9 mg/dL (ref 0.50–1.10)
GFR calc non Af Amer: >60 mL/min (ref >60)
Sodium: 137 mEq/L (ref 135–145)

## 2011-01-31 LAB — CBC
MCHC: 33.7 g/dL (ref 30.0–36.0)
Platelets: 254 10*3/uL (ref 150–400)
RDW: 13.8 % (ref 11.5–15.5)
WBC: 6.8 10*3/uL (ref 4.0–10.5)

## 2011-01-31 MED ORDER — FUROSEMIDE 40 MG PO TABS: 40.0000 mg | ORAL_TABLET | Freq: Every day | ORAL | Status: DC

## 2011-01-31 MED ORDER — ALBUTEROL SULFATE HFA 108 (90 BASE) MCG/ACT IN AERS: 2.0000 | INHALATION_SPRAY | RESPIRATORY_TRACT | Status: DC | PRN

## 2011-01-31 MED ORDER — IPRATROPIUM BROMIDE 0.02 % IN SOLN
0.5000 mg | Freq: Once | RESPIRATORY_TRACT | Status: AC
  Administered 2011-01-31: 0.5 mg via RESPIRATORY_TRACT
  Filled 2011-01-31: qty 2.5

## 2011-01-31 MED ORDER — IPRATROPIUM BROMIDE HFA 17 MCG/ACT IN AERS: 2.0000 | INHALATION_SPRAY | Freq: Four times a day (QID) | RESPIRATORY_TRACT | Status: DC

## 2011-01-31 MED ORDER — ALBUTEROL SULFATE (5 MG/ML) 0.5% IN NEBU
5.0000 mg | INHALATION_SOLUTION | Freq: Once | RESPIRATORY_TRACT | Status: AC
  Administered 2011-01-31: 5 mg via RESPIRATORY_TRACT
  Filled 2011-01-31: qty 1

## 2011-01-31 NOTE — ED Notes (Signed)
Attempted to call report to SNF x2 without answer.

## 2011-01-31 NOTE — ED Provider Notes (Addendum)
History     CSN: [id number] Arrival date & time: 01/31/2011  4:05 PM  Chief Complaint  Patient presents with  . Hypertension    non-productive cough and edema lower extremitie   HPI Comments: The patient has a persistent cough. She has been on Lasix, and recently was on Tessalon perlels. She complains of cough and wheezing. She has an albuterol inhaler. Is on a number of different medications for depression and anxiety and hypertension. She also saw a proton except for esophageal reflux and pravastatin for high cholesterol. She lives in a nursing home, [facility].   Patient is a 70 y.o. female presenting with wheezing.  Wheezing  The current episode started more than 2 weeks ago. The problem occurs frequently. The problem has been unchanged. The problem is moderate. The symptoms are relieved by beta-agonist inhalers. The symptoms are aggravated by allergens (She says that aerosols bother her breathing.). Associated symptoms include shortness of breath and wheezing. Steroid use: Steroids steroids cause abnormal behavior. She has been behaving normally. Urine output has been normal. The last void occurred less than 6 hours ago. There were no sick contacts. Recent Medical Care: She has had several visits [facility] her shortness of breath.    Past Medical History  Diagnosis Date  . Hypertension   . GERD (gastroesophageal reflux disease)   . Dementia   . IBS (irritable bowel syndrome)   . Arthritis   . Constipation   . Hiatal hernia   . Bipolar 1 disorder   . Diabetes mellitus   . Bronchitis   . Chronic back pain     History reviewed. No pertinent past surgical history.  No family history on file.  History  Substance Use Topics  . Smoking status: Never Smoker   . Smokeless tobacco: Not on file  . Alcohol Use: No    OB History    Grav Para Term Preterm Abortions TAB SAB Ect Mult Living                  Review of Systems  Constitutional: Negative.    HENT: Negative.   Eyes: Negative.   Respiratory: Positive for shortness of breath and wheezing.   Cardiovascular: Positive for leg swelling.  Gastrointestinal: Negative.   Genitourinary: Negative.   Musculoskeletal: Negative.   Neurological: Negative.   Psychiatric/Behavioral: Negative.     Physical Exam  BP 183/73  Pulse 57  Temp(Src) 99.2 F (37.3 C) (Oral)  Resp 20  SpO2 98%  Physical Exam  Nursing note and vitals reviewed. Constitutional: She is oriented to person, place, and time. She appears well-developed and well-nourished. No distress.  HENT:  Head: Normocephalic and atraumatic.  Right Ear: External ear normal.  Mouth/Throat: Oropharynx is clear and moist.  Eyes: Conjunctivae and EOM are normal. Pupils are equal, round, and reactive to light.  Neck: Normal range of motion.  Cardiovascular: Normal rate and regular rhythm.   Pulmonary/Chest: Effort normal. Stridor: she has wheezing heard over the larynx. She has wheezes.  Abdominal: Soft. Bowel sounds are normal.  Musculoskeletal: Normal range of motion.  Neurological: She is alert and oriented to person, place, and time.       No sensory or motor deficits.  Skin: Skin is warm.       She has 2+ ankle edema.  Psychiatric: She has a normal mood and affect. Her behavior is normal.    ED Course  Procedures    Date: 01/31/2011  Rate: 54 Rhythm: sinus bradycardia  QRS Axis: normal  Intervals: normal  ST/T Wave abnormalities: normal  Conduction Disutrbances:none  Narrative Interpretation:  Sinus bradycardia, otherwise normal  Old EKG Reviewed: unchanged      Carleene Cooper III, MD 01/31/11 1801  Course in the ED: Patient was seen and had physical exam, which showed wheezing. Has been evaluated for nebulizer treatment was ordered. Laboratory tests showed slightly elevated B. natruretic peptide. Chest x-ray was unchanged, and showed no pneumonia minimal signs of congestive. I advised her to continue Lasix 40  mg per day she should use the albuterol inhaler 2 puffs 4 times a day and Atrovent inhaler 4 times a day. She should followup with her doctor at [facility].  Carleene Cooper III, MD 02/01/11 1032

## 2011-01-31 NOTE — ED Notes (Signed)
Patient is an independent resident at [facility] and was assessed by nurse today and found to have B/P of 220/104.  Sent here for further evaluation.  Patient also have a cough and swelling in her lower extremities.

## 2011-01-31 NOTE — ED Notes (Signed)
Pt ambulated to bathroom with minimal assistance.

## 2011-06-01 ENCOUNTER — Encounter (HOSPITAL_BASED_OUTPATIENT_CLINIC_OR_DEPARTMENT_OTHER): Payer: Self-pay

## 2011-06-01 ENCOUNTER — Emergency Department (HOSPITAL_BASED_OUTPATIENT_CLINIC_OR_DEPARTMENT_OTHER)
Admission: EM | Admit: 2011-06-01 | Discharge: 2011-06-01 | Disposition: A | Discharge status: Home / Self Care | Payer: Medicare Other | Attending: Emergency Medicine | Admitting: Emergency Medicine

## 2011-06-01 DIAGNOSIS — F319 Bipolar disorder, unspecified: Secondary | ICD-10-CM | POA: Insufficient documentation

## 2011-06-01 DIAGNOSIS — F419 Anxiety disorder, unspecified: Secondary | ICD-10-CM

## 2011-06-01 DIAGNOSIS — F411 Generalized anxiety disorder: Secondary | ICD-10-CM | POA: Insufficient documentation

## 2011-06-01 LAB — BASIC METABOLIC PANEL
Calcium: 9.8 mg/dL (ref 8.4–10.5)
GFR calc Af Amer: 73 mL/min — ABNORMAL LOW (ref >90)
GFR calc non Af Amer: 63 mL/min — ABNORMAL LOW (ref >90)
Potassium: 3.4 mEq/L — ABNORMAL LOW (ref 3.5–5.1)
Sodium: 139 mEq/L (ref 135–145)

## 2011-06-01 LAB — CBC
Hemoglobin: 12.5 g/dL (ref 12.0–15.0)
MCH: 28.6 pg (ref 26.0–34.0)
MCHC: 33.2 g/dL (ref 30.0–36.0)
Platelets: 259 10*3/uL (ref 150–400)
RBC: 4.37 MIL/uL (ref 3.87–5.11)

## 2011-06-01 LAB — DIFFERENTIAL
Basophils Relative: 0 % (ref 0–1)
Eosinophils Absolute: 0.1 10*3/uL (ref 0.0–0.7)
Lymphs Abs: 2.3 10*3/uL (ref 0.7–4.0)
Monocytes Relative: 11 % (ref 3–12)
Neutro Abs: 4.1 10*3/uL (ref 1.7–7.7)
Neutrophils Relative %: 56 % (ref 43–77)

## 2011-06-01 MED ORDER — LORAZEPAM 1 MG PO TABS
2.0000 mg | ORAL_TABLET | Freq: Once | ORAL | Status: AC
  Administered 2011-06-01: 2 mg via ORAL
  Filled 2011-06-01: qty 2

## 2011-06-01 NOTE — ED Notes (Signed)
Pt has had a recent change in anxiety medication.  She reports initially she received ativan TID and for the past week she receives it qd. States she can't sleep.

## 2011-06-01 NOTE — ED Notes (Signed)
Pt informed of plan of care and communications called for transport.

## 2011-06-01 NOTE — ED Notes (Signed)
Pt assistec to [facility] and returned to ED stretcher.  Pt transferred to hallway.

## 2011-06-01 NOTE — ED Provider Notes (Signed)
History   This chart was scribed for Nelia Shi, MD by Sofie Rower. The patient was seen in room MH09/MH09 and the patient's care was started at 3:39PM.    CSN: [id number]  Arrival date & time 06/01/11  1531   First MD Initiated Contact with Patient 06/01/11 1531      Chief Complaint  Patient presents with  . Anxiety    (Consider location/radiation/quality/duration/timing/severity/associated sxs/prior treatment) HPI  Cindy Padilla is a 71 y.o. female who presents to the Emergency Department complaining of moderate, constant anxiety onset today with associated symptoms of loss of sleep, dizziness. Pt is currently taking ativan. Pt is currently living in an assisted living home. Pt also complains of a fungus between her toes claiming, "it itches". Pt has an appointment, Jan. 10th at [facility] with "Arline Asp".  Past Medical History  Diagnosis Date  . Hypertension   . GERD (gastroesophageal reflux disease)   . Dementia   . IBS (irritable bowel syndrome)   . Arthritis   . Constipation   . Hiatal hernia   . Bipolar 1 disorder   . Diabetes mellitus   . Bronchitis   . Chronic back pain     History reviewed. No pertinent past surgical history.  No family history on file.  History  Substance Use Topics  . Smoking status: Never Smoker   . Smokeless tobacco: Not on file  . Alcohol Use: No    OB History    Grav Para Term Preterm Abortions TAB SAB Ect Mult Living                  Review of Systems  10 Systems reviewed and are negative for acute change except as noted in the HPI.   Allergies  Aspirin; Codeine; Ivp dye; Morphine and related; Neomycin; Penicillins; Promethazine; Sulfa antibiotics; and Tetracyclines & related  Home Medications   Current Outpatient Rx  Name Route Sig Dispense Refill  . ACETAMINOPHEN 325 MG PO TABS Oral Take 650 mg by mouth every 6 (six) hours as needed. Pain or fever     . ALBUTEROL SULFATE HFA 108 (90 BASE) MCG/ACT IN AERS  Inhalation Inhale 2 puffs into the lungs every 4 (four) hours as needed for wheezing. 1 Inhaler 0  . ALBUTEROL SULFATE HFA 108 (90 BASE) MCG/ACT IN AERS Inhalation Inhale 2 puffs into the lungs every 4 (four) hours as needed for wheezing. 1 Inhaler 0  . ALBUTEROL 90 MCG/ACT IN AERS Inhalation Inhale 2 puffs into the lungs every 4 (four) hours as needed. Wheezing and shortness of breath     . AZITHROMYCIN 250 MG PO TABS Oral Take 1 tablet (250 mg total) by mouth daily. 4 tablet 0  . CARVEDILOL 6.25 MG PO TABS Oral Take 6.25 mg by mouth 2 (two) times daily with a meal.      . VITAMIN D 1000 UNITS PO CAPS Oral Take 1,000 Units by mouth daily.      Marland Kitchen DICYCLOMINE HCL 10 MG PO CAPS Oral Take 10 mg by mouth 2 (two) times daily.      Marland Kitchen DOCUSATE SODIUM 100 MG PO CAPS Oral Take 100 mg by mouth daily.      . DONEPEZIL HCL 10 MG PO TABS Oral Take 10 mg by mouth at bedtime.      Marland Kitchen FLUTICASONE PROPIONATE 50 MCG/ACT NA SUSP Nasal Place 2 sprays into the nose daily.     Marland Kitchen FLUTICASONE PROPIONATE  HFA 220 MCG/ACT IN AERO Inhalation  Inhale 2 puffs into the lungs at bedtime.      . FUROSEMIDE 40 MG PO TABS Oral Take 1 tablet (40 mg total) by mouth daily. 15 tablet 0  . FUROSEMIDE 40 MG PO TABS Oral Take 1 tablet (40 mg total) by mouth daily. 30 tablet 0  . HYDROCORTISONE 2.5 % EX OINT Topical Apply topically as needed. Affected areas      . IPRATROPIUM BROMIDE HFA 17 MCG/ACT IN AERS Inhalation Inhale 2 puffs into the lungs every 6 (six) hours. 1 Inhaler 12  . LAMOTRIGINE 100 MG PO TABS Oral Take 100 mg by mouth daily.      Marland Kitchen LAMOTRIGINE 100 MG PO TABS Oral Take 200 mg by mouth every evening.      Marland Kitchen LISINOPRIL 40 MG PO TABS Oral Take 40 mg by mouth daily.      Marland Kitchen LORATADINE 10 MG PO TABS Oral Take 10 mg by mouth daily.      Marland Kitchen LORAZEPAM 0.5 MG PO TABS Oral Take 1 mg by mouth 3 (three) times daily.      Marland Kitchen LORAZEPAM 1 MG PO TABS Oral Take 1 mg by mouth every 6 (six) hours as needed. anxiety     . PANTOPRAZOLE SODIUM 40  MG PO TBEC Oral Take 40 mg by mouth 2 (two) times daily.      Marland Kitchen POLYETHYLENE GLYCOL 3350 PO PACK Oral Take 17 g by mouth daily.      Marland Kitchen PRAVASTATIN SODIUM 40 MG PO TABS Oral Take 40 mg by mouth daily.      . QUETIAPINE FUMARATE ER 50 MG PO TB24 Oral Take 100 mg by mouth daily.      . QUETIAPINE FUMARATE 50 MG PO TABS Oral Take 50 mg by mouth every morning.      Marland Kitchen TRAMADOL HCL 50 MG PO TABS Oral Take 50 mg by mouth every 6 (six) hours as needed. Pain      . VERAPAMIL HCL ER 180 MG PO TBCR Oral Take 180 mg by mouth daily.        BP 181/80  Pulse 83  Temp(Src) 97.7 F (36.5 C) (Oral)  Resp 20  Ht 5' (1.524 m)  Wt 187 lb (84.823 kg)  BMI 36.52 kg/m2  SpO2 100%  Physical Exam  Nursing note and vitals reviewed. Constitutional: She is oriented to person, place, and time. She appears well-developed and well-nourished. No distress.  HENT:  Head: Normocephalic and atraumatic.  Right Ear: External ear normal.  Left Ear: External ear normal.  Nose: Nose normal.  Eyes: Conjunctivae and EOM are normal. Pupils are equal, round, and reactive to light. Right eye exhibits no discharge. Left eye exhibits no discharge.  Neck: Normal range of motion. Neck supple. No tracheal deviation present. No thyromegaly present.  Cardiovascular: Normal rate, regular rhythm and intact distal pulses.   Pulmonary/Chest: Effort normal and breath sounds normal. No respiratory distress.  Abdominal: Soft. Bowel sounds are normal. She exhibits no distension. There is no tenderness. There is no rebound.  Musculoskeletal: Normal range of motion. She exhibits no edema.  Neurological: She is alert and oriented to person, place, and time. No sensory deficit.  Skin: Skin is warm and dry.  Psychiatric: She has a normal mood and affect. Her behavior is normal.       Manic. Has flat ive ideas.    ED Course  Procedures (including critical care time)  DIAGNOSTIC STUDIES: Oxygen Saturation is 100% on room air, normal by my  interpretation.    COORDINATION OF CARE:    Results for orders placed during the hospital encounter of 06/01/11  BASIC METABOLIC PANEL      Component Value Range   Sodium 139  135 - 145 (mEq/L)   Potassium 3.4 (*) 3.5 - 5.1 (mEq/L)   Chloride 97  96 - 112 (mEq/L)   CO2 29  19 - 32 (mEq/L)   Glucose, Bld 112 (*) 70 - 99 (mg/dL)   BUN 16  6 - 23 (mg/dL)   Creatinine, Ser 4.09  0.50 - 1.10 (mg/dL)   Calcium 9.8  8.4 - 81.1 (mg/dL)   GFR calc non Af Amer 63 (*) >90 (mL/min)   GFR calc Af Amer 73 (*) >90 (mL/min)  CBC      Component Value Range   WBC 7.3  4.0 - 10.5 (K/uL)   RBC 4.37  3.87 - 5.11 (MIL/uL)   Hemoglobin 12.5  12.0 - 15.0 (g/dL)   HCT 91.4  78.2 - 95.6 (%)   MCV 86.0  78.0 - 100.0 (fL)   MCH 28.6  26.0 - 34.0 (pg)   MCHC 33.2  30.0 - 36.0 (g/dL)   RDW 21.3  08.6 - 57.8 (%)   Platelets 259  150 - 400 (K/uL)  DIFFERENTIAL      Component Value Range   Neutrophils Relative 56  43 - 77 (%)   Neutro Abs 4.1  1.7 - 7.7 (K/uL)   Lymphocytes Relative 31  12 - 46 (%)   Lymphs Abs 2.3  0.7 - 4.0 (K/uL)   Monocytes Relative 11  3 - 12 (%)   Monocytes Absolute 0.8  0.1 - 1.0 (K/uL)   Eosinophils Relative 1  0 - 5 (%)   Eosinophils Absolute 0.1  0.0 - 0.7 (K/uL)   Basophils Relative 0  0 - 1 (%)   Basophils Absolute 0.0  0.0 - 0.1 (K/uL)   No results found.  Diagnosis:  Anxiety   MDM  Medical screening examination/treatment/procedure(s) were performed by non-physician practitioner and as supervising physician I was immediately available for consultation/collaboration.        Nelia Shi, MD 06/02/11 1140

## 2011-06-18 ENCOUNTER — Emergency Department (HOSPITAL_BASED_OUTPATIENT_CLINIC_OR_DEPARTMENT_OTHER)
Admission: EM | Admit: 2011-06-18 | Discharge: 2011-06-19 | Disposition: A | Discharge status: Home / Self Care | Payer: Medicare Other | Attending: Emergency Medicine | Admitting: Emergency Medicine

## 2011-06-18 DIAGNOSIS — Z79899 Other long term (current) drug therapy: Secondary | ICD-10-CM | POA: Insufficient documentation

## 2011-06-18 DIAGNOSIS — G8929 Other chronic pain: Secondary | ICD-10-CM | POA: Insufficient documentation

## 2011-06-18 DIAGNOSIS — K219 Gastro-esophageal reflux disease without esophagitis: Secondary | ICD-10-CM | POA: Insufficient documentation

## 2011-06-18 DIAGNOSIS — K644 Residual hemorrhoidal skin tags: Secondary | ICD-10-CM

## 2011-06-18 DIAGNOSIS — K589 Irritable bowel syndrome without diarrhea: Secondary | ICD-10-CM | POA: Insufficient documentation

## 2011-06-18 DIAGNOSIS — F319 Bipolar disorder, unspecified: Secondary | ICD-10-CM | POA: Insufficient documentation

## 2011-06-18 DIAGNOSIS — I10 Essential (primary) hypertension: Secondary | ICD-10-CM | POA: Insufficient documentation

## 2011-06-19 ENCOUNTER — Encounter (HOSPITAL_BASED_OUTPATIENT_CLINIC_OR_DEPARTMENT_OTHER): Payer: Self-pay

## 2011-06-19 LAB — CBC
Hemoglobin: 11.2 g/dL — ABNORMAL LOW (ref 12.0–15.0)
MCH: 28.8 pg (ref 26.0–34.0)
Platelets: 241 10*3/uL (ref 150–400)
RBC: 3.89 MIL/uL (ref 3.87–5.11)
WBC: 8.1 10*3/uL (ref 4.0–10.5)

## 2011-06-19 NOTE — ED Notes (Signed)
Pt states having  Problems with bowel movement,having problems urinating

## 2011-06-19 NOTE — ED Provider Notes (Signed)
History     CSN: [id number]  Arrival date & time 06/18/11  2353   First MD Initiated Contact with Patient 06/19/11 0022      Chief Complaint  Patient presents with  . Constipation    (Consider location/radiation/quality/duration/timing/severity/associated sxs/prior treatment) HPI Comments: Patient presents today complaining of constipation.  She states her last bowel movement was yesterday.  She does note that MiraLAX works well for her and has been given to her at her nursing care facility.  Notably patient appears to have some psychiatric illness and has some flight of ideas during my discussion with her but she does remain calm.  Patient notes that her stools appear soft but complains that she does not really she's had interval dose of MiraLAX at the nursing care facility.  She has not noted any current bleeding but does note a history of hemorrhoids that have had some bleeding previously.  Patient denies any urinary symptoms to me.  She does note some small pustules on her right and left labia which intermittently appear to have drained.  She has no fevers or abdominal pain.  No nausea or vomiting.  Patient is a 71 y.o. female presenting with constipation. The history is provided by the patient. No language interpreter was used.  Constipation  The current episode started today. The problem occurs frequently. The patient is experiencing no pain. The stool is described as soft. Prior successful therapies include stool softeners. Associated symptoms include hemorrhoids. Pertinent negatives include no anorexia, no fever, no abdominal pain, no diarrhea, no hematemesis, no nausea, no rectal pain, no vomiting, no hematuria, no vaginal bleeding, no vaginal discharge, no chest pain, no headaches, no coughing, no difficulty breathing and no rash.    Past Medical History  Diagnosis Date  . Hypertension   . GERD (gastroesophageal reflux disease)   . Dementia   . IBS (irritable bowel syndrome)   .  Arthritis   . Constipation   . Hiatal hernia   . Bipolar 1 disorder   . Diabetes mellitus   . Bronchitis   . Chronic back pain     History reviewed. No pertinent past surgical history.  History reviewed. No pertinent family history.  History  Substance Use Topics  . Smoking status: Never Smoker   . Smokeless tobacco: Not on file  . Alcohol Use: No    OB History    Grav Para Term Preterm Abortions TAB SAB Ect Mult Living                  Review of Systems  Constitutional: Negative.  Negative for fever and chills.  HENT: Negative.   Eyes: Negative.  Negative for discharge and redness.  Respiratory: Negative.  Negative for cough and shortness of breath.   Cardiovascular: Negative.  Negative for chest pain.  Gastrointestinal: Positive for constipation and hemorrhoids. Negative for nausea, vomiting, abdominal pain, diarrhea, rectal pain, anorexia and hematemesis.  Genitourinary: Negative for dysuria, hematuria, vaginal bleeding and vaginal discharge.  Musculoskeletal: Negative.  Negative for back pain.  Skin: Negative.  Negative for color change and rash.  Neurological: Negative.  Negative for syncope and headaches.  Hematological: Negative.  Negative for adenopathy.  Psychiatric/Behavioral: Negative for confusion.  All other systems reviewed and are negative.    Allergies  Aspirin; Codeine; Ivp dye; Morphine and related; Neomycin; Penicillins; Promethazine; Sulfa antibiotics; and Tetracyclines & related  Home Medications   Current Outpatient Rx  Name Route Sig Dispense Refill  . ACETAMINOPHEN 325 MG PO  TABS Oral Take 650 mg by mouth every 6 (six) hours as needed. Pain or fever     . ALBUTEROL SULFATE HFA 108 (90 BASE) MCG/ACT IN AERS Inhalation Inhale 2 puffs into the lungs every 4 (four) hours as needed for wheezing. 1 Inhaler 0  . ALBUTEROL SULFATE HFA 108 (90 BASE) MCG/ACT IN AERS Inhalation Inhale 2 puffs into the lungs every 4 (four) hours as needed for wheezing.  1 Inhaler 0  . ALBUTEROL 90 MCG/ACT IN AERS Inhalation Inhale 2 puffs into the lungs every 4 (four) hours as needed. Wheezing and shortness of breath     . AZITHROMYCIN 250 MG PO TABS Oral Take 1 tablet (250 mg total) by mouth daily. 4 tablet 0  . CARVEDILOL 6.25 MG PO TABS Oral Take 6.25 mg by mouth 2 (two) times daily with a meal.      . VITAMIN D 1000 UNITS PO CAPS Oral Take 1,000 Units by mouth daily.      Marland Kitchen DICYCLOMINE HCL 10 MG PO CAPS Oral Take 10 mg by mouth 2 (two) times daily.      Marland Kitchen DOCUSATE SODIUM 100 MG PO CAPS Oral Take 100 mg by mouth daily.      . DONEPEZIL HCL 10 MG PO TABS Oral Take 10 mg by mouth at bedtime.      Marland Kitchen FLUTICASONE PROPIONATE 50 MCG/ACT NA SUSP Nasal Place 2 sprays into the nose daily.     Marland Kitchen FLUTICASONE PROPIONATE  HFA 220 MCG/ACT IN AERO Inhalation Inhale 2 puffs into the lungs at bedtime.      . FUROSEMIDE 40 MG PO TABS Oral Take 1 tablet (40 mg total) by mouth daily. 15 tablet 0  . FUROSEMIDE 40 MG PO TABS Oral Take 1 tablet (40 mg total) by mouth daily. 30 tablet 0  . HYDROCORTISONE 2.5 % EX OINT Topical Apply topically as needed. Affected areas      . IPRATROPIUM BROMIDE HFA 17 MCG/ACT IN AERS Inhalation Inhale 2 puffs into the lungs every 6 (six) hours. 1 Inhaler 12  . LAMOTRIGINE 100 MG PO TABS Oral Take 100 mg by mouth daily.      Marland Kitchen LAMOTRIGINE 100 MG PO TABS Oral Take 200 mg by mouth every evening.      Marland Kitchen LISINOPRIL 40 MG PO TABS Oral Take 40 mg by mouth daily.      Marland Kitchen LORATADINE 10 MG PO TABS Oral Take 10 mg by mouth daily.      Marland Kitchen LORAZEPAM 0.5 MG PO TABS Oral Take 1 mg by mouth 3 (three) times daily.      Marland Kitchen LORAZEPAM 1 MG PO TABS Oral Take 1 mg by mouth every 6 (six) hours as needed. anxiety     . PANTOPRAZOLE SODIUM 40 MG PO TBEC Oral Take 40 mg by mouth 2 (two) times daily.      Marland Kitchen POLYETHYLENE GLYCOL 3350 PO PACK Oral Take 17 g by mouth daily.      Marland Kitchen PRAVASTATIN SODIUM 40 MG PO TABS Oral Take 40 mg by mouth daily.      . QUETIAPINE FUMARATE ER 50 MG  PO TB24 Oral Take 100 mg by mouth daily.      . QUETIAPINE FUMARATE 50 MG PO TABS Oral Take 50 mg by mouth every morning.      Marland Kitchen TRAMADOL HCL 50 MG PO TABS Oral Take 50 mg by mouth every 6 (six) hours as needed. Pain      . VERAPAMIL HCL ER  180 MG PO TBCR Oral Take 180 mg by mouth daily.        BP 187/69  Pulse 69  Temp(Src) 98.4 F (36.9 C) (Oral)  Ht 5\' 1"  (1.549 m)  Wt 181 lb (82.101 kg)  BMI 34.20 kg/m2  SpO2 100%  Physical Exam  Nursing note and vitals reviewed. Constitutional: She is oriented to person, place, and time. She appears well-developed and well-nourished.  Non-toxic appearance. She does not have a sickly appearance.  HENT:  Head: Normocephalic and atraumatic.  Eyes: Conjunctivae, EOM and lids are normal. Pupils are equal, round, and reactive to light. No scleral icterus.  Neck: Trachea normal and normal range of motion. Neck supple.  Cardiovascular: Normal rate, regular rhythm and normal heart sounds.   Pulmonary/Chest: Effort normal and breath sounds normal.  Abdominal: Soft. Normal appearance. She exhibits no distension. There is no tenderness. There is no rebound, no guarding and no CVA tenderness.  Genitourinary: Guaiac negative stool.       Patient with no significant stool noted in the rectal vault.  No signs of bleeding or melena.  There is one external hemorrhoid present.  Examination of the patient's labia does demonstrate 3 very small pustules that are approximately 1-2 mm in size.  There is no palpable induration or fluid collection.    Musculoskeletal: Normal range of motion.  Neurological: She is alert and oriented to person, place, and time. She has normal strength.  Skin: Skin is warm, dry and intact. No rash noted.  Psychiatric:       Patient has remained calm here but does have flight of ideas during my conversation with her needs to be frequently redirected to attempt to answer the question I've asked her.    ED Course  Procedures (including  critical care time)  Results for orders placed during the hospital encounter of 06/18/11  CBC      Component Value Range   WBC 8.1  4.0 - 10.5 (K/uL)   RBC 3.89  3.87 - 5.11 (MIL/uL)   Hemoglobin 11.2 (*) 12.0 - 15.0 (g/dL)   HCT 09.8 (*) 11.9 - 46.0 (%)   MCV 86.6  78.0 - 100.0 (fL)   MCH 28.8  26.0 - 34.0 (pg)   MCHC 33.2  30.0 - 36.0 (g/dL)   RDW 14.7  82.9 - 56.2 (%)   Platelets 241  150 - 400 (K/uL)       MDM  Given the patient's difficulty in being able to give a history at the check hemoglobin to make sure the patient is not anemic.  Her hemoglobin is 11 and so does not require further intervention.  Patient is not acutely seem to have any signs of bleeding at this time.  She does not appear to have significant constipation based on her history or on my exam that she has any impaction.  I believe the patient is safe to go back to her nursing care facility where they can continue to manage her constipation there.        Nat Christen, MD 06/19/11 [phone]

## 2011-06-19 NOTE — ED Notes (Signed)
Pt lower ext swollen 3+ edema noted

## 2011-10-29 ENCOUNTER — Emergency Department (HOSPITAL_COMMUNITY)
Admission: EM | Admit: 2011-10-29 | Discharge: 2011-10-29 | Disposition: A | Discharge status: Home / Self Care | Payer: Medicare Other | Attending: Emergency Medicine | Admitting: Emergency Medicine

## 2011-10-29 ENCOUNTER — Encounter (HOSPITAL_COMMUNITY): Payer: Self-pay | Admitting: *Deleted

## 2011-10-29 DIAGNOSIS — K649 Unspecified hemorrhoids: Secondary | ICD-10-CM

## 2011-10-29 DIAGNOSIS — K644 Residual hemorrhoidal skin tags: Secondary | ICD-10-CM | POA: Insufficient documentation

## 2011-10-29 DIAGNOSIS — F0390 Unspecified dementia without behavioral disturbance: Secondary | ICD-10-CM | POA: Insufficient documentation

## 2011-10-29 DIAGNOSIS — K589 Irritable bowel syndrome without diarrhea: Secondary | ICD-10-CM | POA: Insufficient documentation

## 2011-10-29 DIAGNOSIS — K219 Gastro-esophageal reflux disease without esophagitis: Secondary | ICD-10-CM | POA: Insufficient documentation

## 2011-10-29 DIAGNOSIS — K6289 Other specified diseases of anus and rectum: Secondary | ICD-10-CM | POA: Insufficient documentation

## 2011-10-29 DIAGNOSIS — F319 Bipolar disorder, unspecified: Secondary | ICD-10-CM | POA: Insufficient documentation

## 2011-10-29 DIAGNOSIS — I10 Essential (primary) hypertension: Secondary | ICD-10-CM | POA: Insufficient documentation

## 2011-10-29 LAB — APTT: aPTT: 27 seconds (ref 24–37)

## 2011-10-29 LAB — CBC
HCT: 35.8 % — ABNORMAL LOW (ref 36.0–46.0)
Hemoglobin: 11.9 g/dL — ABNORMAL LOW (ref 12.0–15.0)
MCV: 88 fL (ref 78.0–100.0)
WBC: 6.9 10*3/uL (ref 4.0–10.5)

## 2011-10-29 LAB — BASIC METABOLIC PANEL
BUN: 25 mg/dL — ABNORMAL HIGH (ref 6–23)
CO2: 29 mEq/L (ref 19–32)
Chloride: 97 mEq/L (ref 96–112)
Creatinine, Ser: 1.13 mg/dL — ABNORMAL HIGH (ref 0.50–1.10)
Glucose, Bld: 102 mg/dL — ABNORMAL HIGH (ref 70–99)
Potassium: 4.2 mEq/L (ref 3.5–5.1)

## 2011-10-29 MED ORDER — PRAMOXINE HCL 1 % RE FOAM: RECTAL | Status: AC | PRN

## 2011-10-29 NOTE — ED Notes (Signed)
Pt. Up to bathroom.  No bleeding from rectum noted.

## 2011-10-29 NOTE — Discharge Instructions (Signed)

## 2011-10-29 NOTE — ED Notes (Signed)
Pt in by ems from [facility]. Bleeding since noon. Staff placed a pad, did not place any hemorrhoid cream due to it still bleeding. Pt c/o soreness.

## 2011-10-29 NOTE — ED Notes (Signed)
[facility] called to transport pt. To [facility].

## 2011-10-29 NOTE — ED Notes (Signed)
Patient attempted to void in cup and missed cup or forgot to go in cup.

## 2011-10-29 NOTE — ED Provider Notes (Signed)
History     CSN: [id number]  Arrival date & time 10/29/11  1726   First MD Initiated Contact with Patient 10/29/11 1927      Chief Complaint  Patient presents with  . Hemorrhoids    HPI Pt was straining to have a bowel movement this am.  After having her bowel movement she noticed some blood in the toilet.  Pt has history of hemorrhoids in the past. Patient has not had any abdominal pain or nausea or vomiting. She continued to have some blood in her stool today so they sent her in to the emergency room for evaluation. Patient does have some pain in her rectal area and palpation increases the discomfort Past Medical History  Diagnosis Date  . Hypertension   . GERD (gastroesophageal reflux disease)   . Dementia   . IBS (irritable bowel syndrome)   . Arthritis   . Constipation   . Hiatal hernia   . Bipolar 1 disorder   . Diabetes mellitus   . Bronchitis   . Chronic back pain     History reviewed. No pertinent past surgical history.  No family history on file.  History  Substance Use Topics  . Smoking status: Never Smoker   . Smokeless tobacco: Not on file  . Alcohol Use: No    OB History    Grav Para Term Preterm Abortions TAB SAB Ect Mult Living                  Review of Systems  All other systems reviewed and are negative.    Allergies  Aspirin; Codeine; Ivp dye; Morphine and related; Neomycin; Penicillins; Promethazine; Sulfa antibiotics; and Tetracyclines & related  Home Medications   Current Outpatient Rx  Name Route Sig Dispense Refill  . ACETAMINOPHEN 325 MG PO TABS Oral Take 650 mg by mouth every 6 (six) hours as needed. Pain or fever     . ALBUTEROL SULFATE HFA 108 (90 BASE) MCG/ACT IN AERS Inhalation Inhale 2 puffs into the lungs every 4 (four) hours as needed for wheezing. 1 Inhaler 0  . ALBUTEROL SULFATE HFA 108 (90 BASE) MCG/ACT IN AERS Inhalation Inhale 2 puffs into the lungs every 4 (four) hours as needed for wheezing. 1 Inhaler 0  . ALBUTEROL  90 MCG/ACT IN AERS Inhalation Inhale 2 puffs into the lungs every 4 (four) hours as needed. Wheezing and shortness of breath     . AZITHROMYCIN 250 MG PO TABS Oral Take 1 tablet (250 mg total) by mouth daily. 4 tablet 0  . CARVEDILOL 6.25 MG PO TABS Oral Take 6.25 mg by mouth 2 (two) times daily with a meal.      . VITAMIN D 1000 UNITS PO CAPS Oral Take 1,000 Units by mouth daily.      Marland Kitchen DICYCLOMINE HCL 10 MG PO CAPS Oral Take 10 mg by mouth 2 (two) times daily.      Marland Kitchen DOCUSATE SODIUM 100 MG PO CAPS Oral Take 100 mg by mouth daily.      . DONEPEZIL HCL 10 MG PO TABS Oral Take 10 mg by mouth at bedtime.      Marland Kitchen FLUTICASONE PROPIONATE 50 MCG/ACT NA SUSP Nasal Place 2 sprays into the nose daily.     Marland Kitchen FLUTICASONE PROPIONATE  HFA 220 MCG/ACT IN AERO Inhalation Inhale 2 puffs into the lungs at bedtime.      . FUROSEMIDE 40 MG PO TABS Oral Take 1 tablet (40 mg total) by mouth daily.  15 tablet 0  . FUROSEMIDE 40 MG PO TABS Oral Take 1 tablet (40 mg total) by mouth daily. 30 tablet 0  . HYDROCORTISONE 2.5 % EX OINT Topical Apply topically as needed. Affected areas      . IPRATROPIUM BROMIDE HFA 17 MCG/ACT IN AERS Inhalation Inhale 2 puffs into the lungs every 6 (six) hours. 1 Inhaler 12  . LAMOTRIGINE 100 MG PO TABS Oral Take 100 mg by mouth daily.      Marland Kitchen LAMOTRIGINE 100 MG PO TABS Oral Take 200 mg by mouth every evening.      Marland Kitchen LISINOPRIL 40 MG PO TABS Oral Take 40 mg by mouth daily.      Marland Kitchen LORATADINE 10 MG PO TABS Oral Take 10 mg by mouth daily.      Marland Kitchen LORAZEPAM 0.5 MG PO TABS Oral Take 1 mg by mouth 3 (three) times daily.      Marland Kitchen LORAZEPAM 1 MG PO TABS Oral Take 1 mg by mouth every 6 (six) hours as needed. anxiety     . PANTOPRAZOLE SODIUM 40 MG PO TBEC Oral Take 40 mg by mouth 2 (two) times daily.      Marland Kitchen POLYETHYLENE GLYCOL 3350 PO PACK Oral Take 17 g by mouth daily.      Marland Kitchen PRAVASTATIN SODIUM 40 MG PO TABS Oral Take 40 mg by mouth daily.      . QUETIAPINE FUMARATE ER 50 MG PO TB24 Oral Take 100 mg  by mouth daily.      . QUETIAPINE FUMARATE 50 MG PO TABS Oral Take 50 mg by mouth every morning.      Marland Kitchen TRAMADOL HCL 50 MG PO TABS Oral Take 50 mg by mouth every 6 (six) hours as needed. Pain      . VERAPAMIL HCL ER 180 MG PO TBCR Oral Take 180 mg by mouth daily.        BP 149/110  Pulse 68  Temp(Src) 99.4 F (37.4 C) (Oral)  Resp 20  SpO2 97%  Physical Exam  Nursing note and vitals reviewed. Constitutional: She appears well-developed and well-nourished. No distress.  HENT:  Head: Normocephalic and atraumatic.  Right Ear: External ear normal.  Left Ear: External ear normal.  Eyes: Conjunctivae are normal. Right eye exhibits no discharge. Left eye exhibits no discharge. No scleral icterus.  Neck: Neck supple. No tracheal deviation present.  Cardiovascular: Normal rate, regular rhythm and intact distal pulses.   Pulmonary/Chest: Effort normal and breath sounds normal. No stridor. No respiratory distress. She has no wheezes. She has no rales.  Abdominal: Soft. Bowel sounds are normal. She exhibits no distension. There is no tenderness. There is no rebound and no guarding.  Genitourinary: Rectal exam shows external hemorrhoid and tenderness.       Brown stool, is trace amount of blood noted on the hemorrhoid  Musculoskeletal: She exhibits no edema and no tenderness.  Neurological: She is alert. She has normal strength. No sensory deficit. Cranial nerve deficit:  no gross defecits noted. She exhibits normal muscle tone. She displays no seizure activity. Coordination normal.  Skin: Skin is warm and dry. No rash noted.  Psychiatric: She has a normal mood and affect.    ED Course  Procedures (including critical care time)  Labs Reviewed  CBC - Abnormal; Notable for the following:    Hemoglobin 11.9 (*)    HCT 35.8 (*)    All other components within normal limits  BASIC METABOLIC PANEL - Abnormal; Notable for  the following:    Glucose, Bld 102 (*)    BUN 25 (*)    Creatinine, Ser  1.13 (*)    GFR calc non Af Amer 48 (*)    GFR calc Af Amer 55 (*)    All other components within normal limits  PROTIME-INR  APTT   No results found.   1. Hemorrhoids       MDM  Patient's hemoglobin is stable. She has not had any further rectal bleeding here. I suspect that her bleeding was related to the hemorrhoid. Will treat that with Proctofoam cream and recommend she followup with her GI Dr. for continued outpatient evaluation.  At this time there does not appear to be any evidence of an acute emergency medical condition and the patient appears stable for discharge with appropriate outpatient follow up.         Celene Kras, MD 10/29/11 2137

## 2011-11-11 ENCOUNTER — Emergency Department (HOSPITAL_COMMUNITY)
Admission: EM | Admit: 2011-11-11 | Discharge: 2011-11-11 | Disposition: A | Discharge status: Skilled Nursing Facility | Payer: Medicare Other | Attending: Emergency Medicine | Admitting: Emergency Medicine

## 2011-11-11 ENCOUNTER — Emergency Department (HOSPITAL_COMMUNITY): Payer: Medicare Other

## 2011-11-11 ENCOUNTER — Encounter (HOSPITAL_COMMUNITY): Payer: Self-pay | Admitting: *Deleted

## 2011-11-11 DIAGNOSIS — F0390 Unspecified dementia without behavioral disturbance: Secondary | ICD-10-CM | POA: Insufficient documentation

## 2011-11-11 DIAGNOSIS — M545 Low back pain, unspecified: Secondary | ICD-10-CM | POA: Insufficient documentation

## 2011-11-11 DIAGNOSIS — K449 Diaphragmatic hernia without obstruction or gangrene: Secondary | ICD-10-CM | POA: Insufficient documentation

## 2011-11-11 DIAGNOSIS — F319 Bipolar disorder, unspecified: Secondary | ICD-10-CM | POA: Insufficient documentation

## 2011-11-11 DIAGNOSIS — Z885 Allergy status to narcotic agent status: Secondary | ICD-10-CM | POA: Insufficient documentation

## 2011-11-11 DIAGNOSIS — I10 Essential (primary) hypertension: Secondary | ICD-10-CM | POA: Insufficient documentation

## 2011-11-11 DIAGNOSIS — E119 Type 2 diabetes mellitus without complications: Secondary | ICD-10-CM | POA: Insufficient documentation

## 2011-11-11 DIAGNOSIS — Z881 Allergy status to other antibiotic agents status: Secondary | ICD-10-CM | POA: Insufficient documentation

## 2011-11-11 DIAGNOSIS — M549 Dorsalgia, unspecified: Secondary | ICD-10-CM

## 2011-11-11 DIAGNOSIS — M129 Arthropathy, unspecified: Secondary | ICD-10-CM | POA: Insufficient documentation

## 2011-11-11 DIAGNOSIS — Z886 Allergy status to analgesic agent status: Secondary | ICD-10-CM | POA: Insufficient documentation

## 2011-11-11 DIAGNOSIS — K589 Irritable bowel syndrome without diarrhea: Secondary | ICD-10-CM | POA: Insufficient documentation

## 2011-11-11 DIAGNOSIS — Z91041 Radiographic dye allergy status: Secondary | ICD-10-CM | POA: Insufficient documentation

## 2011-11-11 DIAGNOSIS — K219 Gastro-esophageal reflux disease without esophagitis: Secondary | ICD-10-CM | POA: Insufficient documentation

## 2011-11-11 DIAGNOSIS — Z880 Allergy status to penicillin: Secondary | ICD-10-CM | POA: Insufficient documentation

## 2011-11-11 MED ORDER — ACETAMINOPHEN 325 MG PO TABS
650.0000 mg | ORAL_TABLET | Freq: Once | ORAL | Status: AC
  Administered 2011-11-11: 650 mg via ORAL
  Filled 2011-11-11: qty 2

## 2011-11-11 MED ORDER — TRAMADOL HCL 50 MG PO TABS: 50.0000 mg | ORAL_TABLET | Freq: Four times a day (QID) | ORAL | Status: AC | PRN

## 2011-11-11 NOTE — ED Notes (Signed)
Per EMS pt from [facility], states she fell in the shower this morning when the aides were giving her a bath, the aides at the nursing home state she did not fall, pt complaining of lower back pain, pain in hips also. No deformity noted, no bruising noted on back.

## 2011-11-11 NOTE — ED Provider Notes (Signed)
History     CSN: [id number]  Arrival date & time 11/11/11  1051   First MD Initiated Contact with Patient 11/11/11 1134      Chief Complaint  Patient presents with  . Fall  . Back Pain    (Consider location/radiation/quality/duration/timing/severity/associated sxs/prior treatment) Patient is a 71 y.o. female presenting with fall and back pain. The history is provided by the patient and medical records.  Fall Pertinent negatives include no abdominal pain, no nausea, no vomiting, no hematuria and no headaches.  Back Pain  Pertinent negatives include no headaches, no abdominal pain, no dysuria and no weakness.   the patient is a 71 year old, female, who lives in an assisted living facility.  She was coming out of the shower, and slipped on something wet floor.  She landed on her bottom.  She did not strike her head.  She complains of buttock pain, and lower back pain.  She was able to get up and ambulate after her fall.  She is not taking any anticoagulants.  She denies weakness in her lower extremities.  Past Medical History  Diagnosis Date  . Hypertension   . GERD (gastroesophageal reflux disease)   . Dementia   . IBS (irritable bowel syndrome)   . Arthritis   . Constipation   . Hiatal hernia   . Bipolar 1 disorder   . Diabetes mellitus   . Bronchitis   . Chronic back pain     History reviewed. No pertinent past surgical history.  History reviewed. No pertinent family history.  History  Substance Use Topics  . Smoking status: Never Smoker   . Smokeless tobacco: Not on file  . Alcohol Use: No    OB History    Grav Para Term Preterm Abortions TAB SAB Ect Mult Living                  Review of Systems  Eyes: Negative for visual disturbance.  Cardiovascular: Positive for leg swelling.  Gastrointestinal: Negative for nausea, vomiting and abdominal pain.  Genitourinary: Negative for dysuria and hematuria.  Musculoskeletal: Positive for back pain.  Neurological:  Negative for weakness and headaches.  Hematological: Does not bruise/bleed easily.  Psychiatric/Behavioral: Negative for confusion.  All other systems reviewed and are negative.    Allergies  Aspirin; Codeine; Ivp dye; Morphine and related; Neomycin; Penicillins; Promethazine; Sulfa antibiotics; and Tetracyclines & related  Home Medications   Current Outpatient Rx  Name Route Sig Dispense Refill  . ACETAMINOPHEN 325 MG PO TABS Oral Take 325 mg by mouth every 6 (six) hours as needed. Pain or fever    . ALBUTEROL 90 MCG/ACT IN AERS Inhalation Inhale 2 puffs into the lungs every 4 (four) hours as needed. Wheezing and shortness of breath     . ATORVASTATIN CALCIUM 40 MG PO TABS Oral Take 40 mg by mouth daily.    . BUDESONIDE-FORMOTEROL FUMARATE 160-4.5 MCG/ACT IN AERO Inhalation Inhale 2 puffs into the lungs 2 (two) times daily.    Marland Kitchen CARVEDILOL 6.25 MG PO TABS Oral Take 6.25 mg by mouth 2 (two) times daily with a meal.      . VITAMIN D 1000 UNITS PO CAPS Oral Take 1,000 Units by mouth daily.      Marland Kitchen CLOPIDOGREL BISULFATE 75 MG PO TABS Oral Take 75 mg by mouth daily.    . DONEPEZIL HCL 10 MG PO TABS Oral Take 10 mg by mouth daily.     Marland Kitchen FLUTICASONE PROPIONATE 50 MCG/ACT NA SUSP  Nasal Place 2 sprays into the nose daily.     . FUROSEMIDE 40 MG PO TABS Oral Take 1 tablet (40 mg total) by mouth daily. 30 tablet 0  . LAMOTRIGINE 200 MG PO TABS Oral Take 200 mg by mouth 2 (two) times daily.    Marland Kitchen LISINOPRIL 20 MG PO TABS Oral Take 20 mg by mouth daily.    Marland Kitchen LORATADINE 10 MG PO TABS Oral Take 10 mg by mouth daily.      Marland Kitchen LORAZEPAM 2 MG PO TABS Oral Take 1 mg by mouth at bedtime.     Marland Kitchen PANTOPRAZOLE SODIUM 40 MG PO TBEC Oral Take 40 mg by mouth daily.     Marland Kitchen POLYETHYLENE GLYCOL 3350 PO PACK Oral Take 17 g by mouth daily.      . QUETIAPINE FUMARATE 100 MG PO TABS Oral Take 100 mg by mouth 2 (two) times daily.    . TRAMADOL HCL 50 MG PO TABS Oral Take 1 tablet (50 mg total) by mouth every 6 (six) hours  as needed for pain. 15 tablet 0    BP 137/50  Pulse 60  Temp 98.5 F (36.9 C) (Oral)  Resp 16  SpO2 94%  Physical Exam  Nursing note and vitals reviewed. Constitutional: She is oriented to person, place, and time. No distress.       Morbidly obese  HENT:  Head: Normocephalic and atraumatic.  Eyes: Conjunctivae are normal.  Neck: Normal range of motion.  Pulmonary/Chest: Effort normal.  Abdominal: She exhibits no distension. There is no tenderness.  Musculoskeletal: Normal range of motion.       Mild tenderness to the lumbar spine and sacrum.  Neurological: She is alert and oriented to person, place, and time.  Skin: Skin is warm and dry.  Psychiatric: She has a normal mood and affect. Thought content normal.    ED Course  Procedures (including critical care time)  Labs Reviewed - No data to display Dg Lumbar Spine Complete  11/11/2011  *RADIOLOGY REPORT*  Clinical Data: Fall.  Pain.  LUMBAR SPINE - COMPLETE 4+ VIEW  Comparison: CT abdomen pelvis 07/30/2010.  Findings: Vertebral body height and alignment are maintained. Facet degenerative disease lower lumbar spine is noted. Paraspinous structures demonstrate atherosclerosis of the abdominal aorta.  IMPRESSION: No acute finding.  Original Report Authenticated By: Bernadene Bell. D'ALESSIO, M.D.   Dg Pelvis 1-2 Views  11/11/2011  *RADIOLOGY REPORT*  Clinical Data: Fall, back pain  PELVIS - 1-2 VIEW  Comparison: None.  Findings: Single frontal view of the pelvis submitted.  No acute fracture or subluxation.  Mild degenerative changes bilateral hip joints.  IMPRESSION: No acute fracture or subluxation.  Mild degenerative changes bilateral hip joints.  Original Report Authenticated By: Natasha Mead, M.D.     1. Back pain       MDM  Fall with lower back pain.  No significant injuries.        Cheri Guppy, MD 11/11/11 [phone]

## 2011-11-11 NOTE — Discharge Instructions (Signed)
Your x-rays don't show any broken bones or dislocations.  Use Tylenol for pain.  Use tramadol for more severe pain.  Followup with your Dr. if your symptoms.  Last more than 3-4 days.  Return for worse or uncontrolled symptoms

## 2011-11-11 NOTE — ED Notes (Signed)
AVW:[id number]> Expected date:<BR> Expected time:10:50 AM<BR> Means of arrival:<BR> Comments:<BR> M61 - 70yoF ?Fall/psych

## 2011-12-23 ENCOUNTER — Emergency Department (HOSPITAL_COMMUNITY)
Admission: EM | Admit: 2011-12-23 | Discharge: 2011-12-24 | Disposition: A | Discharge status: Skilled Nursing Facility | Payer: Medicare Other | Attending: Emergency Medicine | Admitting: Emergency Medicine

## 2011-12-23 ENCOUNTER — Encounter (HOSPITAL_COMMUNITY): Payer: Self-pay

## 2011-12-23 DIAGNOSIS — F319 Bipolar disorder, unspecified: Secondary | ICD-10-CM | POA: Insufficient documentation

## 2011-12-23 DIAGNOSIS — G8929 Other chronic pain: Secondary | ICD-10-CM | POA: Insufficient documentation

## 2011-12-23 DIAGNOSIS — F0390 Unspecified dementia without behavioral disturbance: Secondary | ICD-10-CM | POA: Insufficient documentation

## 2011-12-23 DIAGNOSIS — M129 Arthropathy, unspecified: Secondary | ICD-10-CM | POA: Insufficient documentation

## 2011-12-23 DIAGNOSIS — K219 Gastro-esophageal reflux disease without esophagitis: Secondary | ICD-10-CM

## 2011-12-23 DIAGNOSIS — E119 Type 2 diabetes mellitus without complications: Secondary | ICD-10-CM | POA: Insufficient documentation

## 2011-12-23 DIAGNOSIS — I10 Essential (primary) hypertension: Secondary | ICD-10-CM | POA: Insufficient documentation

## 2011-12-23 MED ORDER — SUCRALFATE 1 G PO TABS
1.0000 g | ORAL_TABLET | Freq: Once | ORAL | Status: AC
  Administered 2011-12-23: 1 g via ORAL
  Filled 2011-12-23: qty 1

## 2011-12-23 MED ORDER — PANTOPRAZOLE SODIUM 40 MG PO TBEC
40.0000 mg | DELAYED_RELEASE_TABLET | Freq: Once | ORAL | Status: AC
  Administered 2011-12-23: 40 mg via ORAL
  Filled 2011-12-23: qty 1

## 2011-12-23 MED ORDER — GI COCKTAIL ~~LOC~~
30.0000 mL | Freq: Once | ORAL | Status: AC
  Administered 2011-12-23: 30 mL via ORAL
  Filled 2011-12-23: qty 30

## 2011-12-23 MED ORDER — PANTOPRAZOLE SODIUM 20 MG PO TBEC: 20.0000 mg | DELAYED_RELEASE_TABLET | Freq: Every day | ORAL | Status: DC

## 2011-12-23 NOTE — ED Notes (Signed)
Per EMS- patient is a resident of [facility]. Patient is c/o heartburn from the lunch that she had consisting of beef and gravy and recurrent hemorrhoids. Patient has a history of alzheimer's. Staff reported that this is a recurrent problem.

## 2011-12-23 NOTE — ED Notes (Signed)
Nursing report called to [facility], and [facility] called to transport pt back to the facility

## 2011-12-23 NOTE — ED Provider Notes (Signed)
History     CSN: [id number]  Arrival date & time 12/23/11  1858   First MD Initiated Contact with Patient 12/23/11 2121      Chief Complaint  Patient presents with  . Heartburn  . Hemorrhoids    (Consider location/radiation/quality/duration/timing/severity/associated sxs/prior treatment) Patient is a 71 y.o. female presenting with heartburn. The history is provided by the patient.  Heartburn   patient here with heartburn that started after she ate food this morning. Notes burning sensation. States that she has not had a PPI recently. No vomiting or black or bloody stools. Increased belching. Symptoms start at the top of her throat and go down to her mid epigastric area. History of similar symptoms in the past. No medications used prior to arrival  Past Medical History  Diagnosis Date  . Hypertension   . GERD (gastroesophageal reflux disease)   . Dementia   . IBS (irritable bowel syndrome)   . Arthritis   . Constipation   . Hiatal hernia   . Bipolar 1 disorder   . Diabetes mellitus   . Bronchitis   . Chronic back pain     History reviewed. No pertinent past surgical history.  History reviewed. No pertinent family history.  History  Substance Use Topics  . Smoking status: Never Smoker   . Smokeless tobacco: Not on file  . Alcohol Use: No    OB History    Grav Para Term Preterm Abortions TAB SAB Ect Mult Living                  Review of Systems  Gastrointestinal: Positive for heartburn.  All other systems reviewed and are negative.    Allergies  Aspirin; Codeine; Ivp dye; Morphine and related; Neomycin; Penicillins; Promethazine; Sulfa antibiotics; and Tetracyclines & related  Home Medications   Current Outpatient Rx  Name Route Sig Dispense Refill  . ACETAMINOPHEN 325 MG PO TABS Oral Take 325 mg by mouth every 6 (six) hours as needed. Pain or fever    . ALBUTEROL 90 MCG/ACT IN AERS Inhalation Inhale 2 puffs into the lungs every 4 (four) hours as  needed. Wheezing and shortness of breath     . ATORVASTATIN CALCIUM 40 MG PO TABS Oral Take 40 mg by mouth daily.    . BUDESONIDE-FORMOTEROL FUMARATE 160-4.5 MCG/ACT IN AERO Inhalation Inhale 2 puffs into the lungs 2 (two) times daily.    Marland Kitchen CARVEDILOL 6.25 MG PO TABS Oral Take 6.25 mg by mouth 2 (two) times daily with a meal.      . VITAMIN D 1000 UNITS PO CAPS Oral Take 1,000 Units by mouth daily.      Marland Kitchen CLOPIDOGREL BISULFATE 75 MG PO TABS Oral Take 75 mg by mouth daily.    Marland Kitchen DICYCLOMINE HCL 10 MG PO CAPS Oral Take 10 mg by mouth 2 (two) times daily.    Marland Kitchen DOCUSATE SODIUM 100 MG PO CAPS Oral Take 100 mg by mouth 2 (two) times daily.    . DONEPEZIL HCL 10 MG PO TABS Oral Take 10 mg by mouth daily.     Marland Kitchen FLUTICASONE PROPIONATE 50 MCG/ACT NA SUSP Nasal Place 2 sprays into the nose daily.     . FUROSEMIDE 40 MG PO TABS Oral Take 40 mg by mouth daily.    Marland Kitchen HYDROCORTISONE 2.5 % RE CREA Rectal Place 1 application rectally daily as needed. Hemorrhoids.    Marland Kitchen LAMOTRIGINE 200 MG PO TABS Oral Take 200 mg by mouth 2 (two) times  daily.    Marland Kitchen LISINOPRIL 20 MG PO TABS Oral Take 20 mg by mouth daily.    Marland Kitchen LORATADINE 10 MG PO TABS Oral Take 10 mg by mouth daily.      Marland Kitchen LORAZEPAM 1 MG PO TABS Oral Take 1 mg by mouth at bedtime.    Marland Kitchen PANTOPRAZOLE SODIUM 40 MG PO TBEC Oral Take 40 mg by mouth daily.     Marland Kitchen POLYETHYLENE GLYCOL 3350 PO PACK Oral Take 17 g by mouth daily.      Marland Kitchen PRAMOXINE HCL 1 % RE FOAM Rectal Place 1 application rectally every 2 (two) hours as needed. For hemorrhoids.    . QUETIAPINE FUMARATE 100 MG PO TABS Oral Take 100 mg by mouth 2 (two) times daily.    . TRAMADOL HCL 50 MG PO TABS Oral Take 50 mg by mouth every 6 (six) hours as needed. Pain.    Marland Kitchen VERAPAMIL HCL ER 120 MG PO TBCR Oral Take 120 mg by mouth every morning.      BP 141/79  Pulse 66  Temp 98.4 F (36.9 C) (Oral)  Resp 18  SpO2 96%  Physical Exam  Nursing note and vitals reviewed. Constitutional: She is oriented to person,  place, and time. She appears well-developed and well-nourished.  Non-toxic appearance. No distress.  HENT:  Head: Normocephalic and atraumatic.  Eyes: Conjunctivae, EOM and lids are normal. Pupils are equal, round, and reactive to light.  Neck: Normal range of motion. Neck supple. No tracheal deviation present. No mass present.  Cardiovascular: Normal rate, regular rhythm and normal heart sounds.  Exam reveals no gallop.   No murmur heard. Pulmonary/Chest: Effort normal and breath sounds normal. No stridor. No respiratory distress. She has no decreased breath sounds. She has no wheezes. She has no rhonchi. She has no rales.  Abdominal: Soft. Normal appearance and bowel sounds are normal. She exhibits no distension. There is no tenderness. There is no rigidity, no rebound, no guarding and no CVA tenderness.  Musculoskeletal: Normal range of motion. She exhibits no edema and no tenderness.  Neurological: She is alert and oriented to person, place, and time. She has normal strength. No cranial nerve deficit or sensory deficit. GCS eye subscore is 4. GCS verbal subscore is 5. GCS motor subscore is 6.  Skin: Skin is warm and dry. No abrasion and no rash noted.  Psychiatric: She has a normal mood and affect. Her speech is normal and behavior is normal.    ED Course  Procedures (including critical care time)  Labs Reviewed - No data to display No results found.   No diagnosis found.    MDM  Pt tx for gerd and she feels better        Toy Baker, MD 12/23/11 2248

## 2012-07-22 ENCOUNTER — Encounter (HOSPITAL_COMMUNITY): Payer: Self-pay | Admitting: Emergency Medicine

## 2012-07-22 ENCOUNTER — Emergency Department (HOSPITAL_COMMUNITY)
Admission: EM | Admit: 2012-07-22 | Discharge: 2012-07-22 | Disposition: A | Discharge status: Home / Self Care | Payer: Medicare Other | Attending: Emergency Medicine | Admitting: Emergency Medicine

## 2012-07-22 ENCOUNTER — Emergency Department (HOSPITAL_COMMUNITY): Payer: Medicare Other

## 2012-07-22 DIAGNOSIS — Z8739 Personal history of other diseases of the musculoskeletal system and connective tissue: Secondary | ICD-10-CM | POA: Insufficient documentation

## 2012-07-22 DIAGNOSIS — E119 Type 2 diabetes mellitus without complications: Secondary | ICD-10-CM | POA: Insufficient documentation

## 2012-07-22 DIAGNOSIS — Z79899 Other long term (current) drug therapy: Secondary | ICD-10-CM | POA: Insufficient documentation

## 2012-07-22 DIAGNOSIS — Z8659 Personal history of other mental and behavioral disorders: Secondary | ICD-10-CM | POA: Insufficient documentation

## 2012-07-22 DIAGNOSIS — F319 Bipolar disorder, unspecified: Secondary | ICD-10-CM | POA: Insufficient documentation

## 2012-07-22 DIAGNOSIS — J40 Bronchitis, not specified as acute or chronic: Secondary | ICD-10-CM | POA: Insufficient documentation

## 2012-07-22 DIAGNOSIS — K219 Gastro-esophageal reflux disease without esophagitis: Secondary | ICD-10-CM | POA: Insufficient documentation

## 2012-07-22 DIAGNOSIS — R259 Unspecified abnormal involuntary movements: Secondary | ICD-10-CM | POA: Insufficient documentation

## 2012-07-22 DIAGNOSIS — Z8709 Personal history of other diseases of the respiratory system: Secondary | ICD-10-CM | POA: Insufficient documentation

## 2012-07-22 DIAGNOSIS — T50905A Adverse effect of unspecified drugs, medicaments and biological substances, initial encounter: Secondary | ICD-10-CM

## 2012-07-22 DIAGNOSIS — Z8719 Personal history of other diseases of the digestive system: Secondary | ICD-10-CM | POA: Insufficient documentation

## 2012-07-22 DIAGNOSIS — I10 Essential (primary) hypertension: Secondary | ICD-10-CM | POA: Insufficient documentation

## 2012-07-22 DIAGNOSIS — T4275XA Adverse effect of unspecified antiepileptic and sedative-hypnotic drugs, initial encounter: Secondary | ICD-10-CM | POA: Insufficient documentation

## 2012-07-22 LAB — CBC WITH DIFFERENTIAL/PLATELET
Basophils Absolute: 0 10*3/uL (ref 0.0–0.1)
Basophils Relative: 0 % (ref 0–1)
HCT: 33.4 % — ABNORMAL LOW (ref 36.0–46.0)
Lymphocytes Relative: 36 % (ref 12–46)
MCHC: 33.5 g/dL (ref 30.0–36.0)
Neutro Abs: 3.7 10*3/uL (ref 1.7–7.7)
Neutrophils Relative %: 55 % (ref 43–77)
Platelets: 263 10*3/uL (ref 150–400)
RDW: 14.6 % (ref 11.5–15.5)
WBC: 6.6 10*3/uL (ref 4.0–10.5)

## 2012-07-22 LAB — BASIC METABOLIC PANEL
CO2: 28 mEq/L (ref 19–32)
Chloride: 95 mEq/L — ABNORMAL LOW (ref 96–112)
Creatinine, Ser: 0.83 mg/dL (ref 0.50–1.10)
GFR calc Af Amer: 80 mL/min — ABNORMAL LOW (ref >90)
Potassium: 3.9 mEq/L (ref 3.5–5.1)
Sodium: 133 mEq/L — ABNORMAL LOW (ref 135–145)

## 2012-07-22 LAB — HEPATIC FUNCTION PANEL
Albumin: 3.5 g/dL (ref 3.5–5.2)
Alkaline Phosphatase: 95 U/L (ref 39–117)
Total Bilirubin: 0.3 mg/dL (ref 0.3–1.2)
Total Protein: 7.3 g/dL (ref 6.0–8.3)

## 2012-07-22 LAB — AMMONIA: Ammonia: 27 umol/L (ref 11–60)

## 2012-07-22 LAB — VALPROIC ACID LEVEL: Valproic Acid Lvl: 40.6 ug/mL — ABNORMAL LOW (ref 50.0–100.0)

## 2012-07-22 MED ORDER — BENZTROPINE MESYLATE 1 MG/ML IJ SOLN
1.0000 mg | Freq: Once | INTRAMUSCULAR | Status: AC
  Administered 2012-07-22: 1 mg via INTRAVENOUS
  Filled 2012-07-22: qty 2

## 2012-07-22 MED ORDER — LORAZEPAM 2 MG/ML IJ SOLN
0.5000 mg | Freq: Once | INTRAMUSCULAR | Status: DC
  Filled 2012-07-22: qty 1

## 2012-07-22 MED ORDER — LORAZEPAM 1 MG PO TABS
1.0000 mg | ORAL_TABLET | Freq: Once | ORAL | Status: AC
  Administered 2012-07-22: 1 mg via ORAL
  Filled 2012-07-22: qty 1

## 2012-07-22 MED ORDER — LORAZEPAM 2 MG/ML IJ SOLN
0.5000 mg | Freq: Once | INTRAMUSCULAR | Status: AC
  Administered 2012-07-22: 0.5 mg via INTRAMUSCULAR

## 2012-07-22 NOTE — ED Provider Notes (Signed)
History     CSN: [id number]  Arrival date & time 07/22/12  4098   First MD Initiated Contact with Patient 07/22/12 [phone]      Chief Complaint  Patient presents with  . Medication Reaction    (Consider location/radiation/quality/duration/timing/severity/associated sxs/prior treatment) HPI Comments: This is a 72 year old female, past medical history remarkable for hypertension, diabetes, dementia, and depression, who presents emergency department with chief complaint of tremors. Patient states that the tremors began 4 days ago. She associates them with recently starting Depakote also 4 days ago. She is taking the Depakote for depression. Patient states that nothing makes her tremors better or worse. She received Benadryl en route to the emergency department, which did not help. She does not have any fevers, rashes, rigidity, clonus, or AMS.  The patient denies any pain at this time.  She states that the tremors bother her most while she is trying to eat.  They are worst in the right hand, but are found bilaterally, in upper and lower extremities and in the lips.  The history is provided by the patient. No language interpreter was used.    Past Medical History  Diagnosis Date  . Hypertension   . GERD (gastroesophageal reflux disease)   . Dementia   . IBS (irritable bowel syndrome)   . Arthritis   . Constipation   . Hiatal hernia   . Bipolar 1 disorder   . Diabetes mellitus   . Bronchitis   . Chronic back pain     History reviewed. No pertinent past surgical history.  No family history on file.  History  Substance Use Topics  . Smoking status: Never Smoker   . Smokeless tobacco: Not on file  . Alcohol Use: No    OB History   Grav Para Term Preterm Abortions TAB SAB Ect Mult Living                  Review of Systems  All other systems reviewed and are negative.    Allergies  Aspirin; Codeine; Ivp dye; Morphine and related; Neomycin; Penicillins; Promethazine; Sulfa  antibiotics; and Tetracyclines & related  Home Medications   Current Outpatient Rx  Name  Route  Sig  Dispense  Refill  . acetaminophen (TYLENOL) 325 MG tablet   Oral   Take 325 mg by mouth every 6 (six) hours as needed. Pain or fever         . albuterol (PROVENTIL,VENTOLIN) 90 MCG/ACT inhaler   Inhalation   Inhale 2 puffs into the lungs every 4 (four) hours as needed. Wheezing and shortness of breath          . atorvastatin (LIPITOR) 40 MG tablet   Oral   Take 40 mg by mouth daily.         . budesonide-formoterol (SYMBICORT) 160-4.5 MCG/ACT inhaler   Inhalation   Inhale 2 puffs into the lungs 2 (two) times daily.         . carvedilol (COREG) 6.25 MG tablet   Oral   Take 6.25 mg by mouth 2 (two) times daily with a meal.           . Cholecalciferol (VITAMIN D) 1000 UNITS capsule   Oral   Take 1,000 Units by mouth daily.           . clopidogrel (PLAVIX) 75 MG tablet   Oral   Take 75 mg by mouth daily.         Marland Kitchen dicyclomine (BENTYL) 10 MG capsule  Oral   Take 10 mg by mouth 2 (two) times daily.         Marland Kitchen docusate sodium (COLACE) 100 MG capsule   Oral   Take 100 mg by mouth 2 (two) times daily.         Marland Kitchen donepezil (ARICEPT) 10 MG tablet   Oral   Take 10 mg by mouth daily.          . fluticasone (FLONASE) 50 MCG/ACT nasal spray   Nasal   Place 2 sprays into the nose daily.          . furosemide (LASIX) 40 MG tablet   Oral   Take 40 mg by mouth daily.         . hydrocortisone (ANUSOL-HC) 2.5 % rectal cream   Rectal   Place 1 application rectally daily as needed. Hemorrhoids.         Marland Kitchen lamoTRIgine (LAMICTAL) 200 MG tablet   Oral   Take 200 mg by mouth 2 (two) times daily.         Marland Kitchen lisinopril (PRINIVIL,ZESTRIL) 20 MG tablet   Oral   Take 20 mg by mouth daily.         Marland Kitchen loratadine (CLARITIN) 10 MG tablet   Oral   Take 10 mg by mouth daily.           Marland Kitchen LORazepam (ATIVAN) 1 MG tablet   Oral   Take 1 mg by mouth at  bedtime.         . pantoprazole (PROTONIX) 20 MG tablet   Oral   Take 1 tablet (20 mg total) by mouth daily.   30 tablet   0   . pantoprazole (PROTONIX) 40 MG tablet   Oral   Take 40 mg by mouth daily.          . polyethylene glycol (MIRALAX / GLYCOLAX) packet   Oral   Take 17 g by mouth daily.           . pramoxine (PROCTOFOAM) 1 % foam   Rectal   Place 1 application rectally every 2 (two) hours as needed. For hemorrhoids.         Marland Kitchen QUEtiapine (SEROQUEL) 100 MG tablet   Oral   Take 100 mg by mouth 2 (two) times daily.         . traMADol (ULTRAM) 50 MG tablet   Oral   Take 50 mg by mouth every 6 (six) hours as needed. Pain.         . verapamil (CALAN-SR) 120 MG CR tablet   Oral   Take 120 mg by mouth every morning.           BP 159/88  Pulse 68  Temp(Src) 98.1 F (36.7 C) (Oral)  Resp 20  SpO2 95%  Physical Exam  Nursing note and vitals reviewed. Constitutional: She is oriented to person, place, and time. She appears well-developed and well-nourished.  HENT:  Head: Normocephalic and atraumatic.  Eyes: Conjunctivae and EOM are normal. Pupils are equal, round, and reactive to light.  Neck: Normal range of motion. Neck supple.  Cardiovascular: Normal rate and regular rhythm.  Exam reveals no gallop and no friction rub.   No murmur heard. Pulmonary/Chest: Effort normal and breath sounds normal. No respiratory distress. She has no wheezes. She has no rales. She exhibits no tenderness.  Abdominal: Soft. Bowel sounds are normal. She exhibits no distension and no mass. There is no tenderness. There is no  rebound and no guarding.  Musculoskeletal: Normal range of motion. She exhibits no edema and no tenderness.  Resting tremors in upper and lower extremities, no intention tremors, no cog-wheeling, no rigidity,   Neurological: She is alert and oriented to person, place, and time.  Sensation and strength intact bilaterally  Skin: Skin is warm and dry.   Psychiatric: She has a normal mood and affect. Her behavior is normal. Judgment and thought content normal.    ED Course  Procedures (including critical care time)  Results for orders placed during the hospital encounter of 07/22/12  CBC WITH DIFFERENTIAL      Result Value Range   WBC 6.6  4.0 - 10.5 K/uL   RBC 3.90  3.87 - 5.11 MIL/uL   Hemoglobin 11.2 (*) 12.0 - 15.0 g/dL   HCT 81.1 (*) 91.4 - 78.2 %   MCV 85.6  78.0 - 100.0 fL   MCH 28.7  26.0 - 34.0 pg   MCHC 33.5  30.0 - 36.0 g/dL   RDW 95.6  21.3 - 08.6 %   Platelets 263  150 - 400 K/uL   Neutrophils Relative 55  43 - 77 %   Neutro Abs 3.7  1.7 - 7.7 K/uL   Lymphocytes Relative 36  12 - 46 %   Lymphs Abs 2.4  0.7 - 4.0 K/uL   Monocytes Relative 8  3 - 12 %   Monocytes Absolute 0.5  0.1 - 1.0 K/uL   Eosinophils Relative 1  0 - 5 %   Eosinophils Absolute 0.1  0.0 - 0.7 K/uL   Basophils Relative 0  0 - 1 %   Basophils Absolute 0.0  0.0 - 0.1 K/uL  BASIC METABOLIC PANEL      Result Value Range   Sodium 133 (*) 135 - 145 mEq/L   Potassium 3.9  3.5 - 5.1 mEq/L   Chloride 95 (*) 96 - 112 mEq/L   CO2 28  19 - 32 mEq/L   Glucose, Bld 103 (*) 70 - 99 mg/dL   BUN 12  6 - 23 mg/dL   Creatinine, Ser 5.78  0.50 - 1.10 mg/dL   Calcium 8.9  8.4 - 46.9 mg/dL   GFR calc non Af Amer 69 (*) >90 mL/min   GFR calc Af Amer 80 (*) >90 mL/min  VALPROIC ACID LEVEL      Result Value Range   Valproic Acid Lvl 40.6 (*) 50.0 - 100.0 ug/mL  AMMONIA      Result Value Range   Ammonia 27  11 - 60 umol/L  CK      Result Value Range   Total CK 83  7 - 177 U/L  HEPATIC FUNCTION PANEL      Result Value Range   Total Protein 7.3  6.0 - 8.3 g/dL   Albumin 3.5  3.5 - 5.2 g/dL   AST 17  0 - 37 U/L   ALT 15  0 - 35 U/L   Alkaline Phosphatase 95  39 - 117 U/L   Total Bilirubin 0.3  0.3 - 1.2 mg/dL   Bilirubin, Direct <6.2  0.0 - 0.3 mg/dL   Indirect Bilirubin NOT CALCULATED  0.3 - 0.9 mg/dL   Ct Head Wo Contrast  07/22/2012  *RADIOLOGY  REPORT*  Clinical Data: Medication reaction to Depakote, shaking  CT HEAD WITHOUT CONTRAST  Technique:  Contiguous axial images were obtained from the base of the skull through the vertex without contrast.  Comparison: None.  Findings:  Mild diffuse cortical atrophy with symmetric mild prominence of the bifrontal extra-axial spaces.  Scattered very minimal periventricular hypodensity suggestive of microvascular ischemic disease.  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  Given background parenchymal abnormalities, there is no CT evidence of acute large territory infarct.  No intraparenchymal or extra-axial mass or hemorrhage.  Normal size and configuration of the ventricles and basilar cisterns.  No midline shift.  Limited visualization of the paranasal sinuses and mastoid air cells are normal.  Regional soft tissues are normal. No displaced calvarial fracture.  IMPRESSION: Atrophy and microvascular ischemic disease without acute intracranial process.   Original Report Authenticated By: Tacey Ruiz, MD       1. Medication reaction, initial encounter       MDM  72 year old female with tremors.  The tremors could be related to the patient's new Depakote, however, will evaluate for other more serious causes.  At this time doubt serotonin syndrome, as there is not AMS.  No cog-wheeling, doubt Parkinson's, but will order CT head.  Will check LFTs and ammonia to check for Depakote toxicity.  Will check Depakote level.  I have discussed this patient with Dr. Manus Gunning, who has also seen the patient personally.  Will order cogentin per Dr. Randel Books recommendation, and will see if the patient's symptoms improve.  12:36 PM Patient improved with Cogentin and Ativan. Discussed the patient with Dr. Leroy Kennedy, from neurology, who agrees that the patient's symptoms are likely due to the depakote.  Will hold depakote, and recommend PCP follow up.  Patient make take her ativan 3 x daily  PRN.  Patient understands and agrees with the plan.  She is stable and ready for discharge.        Roxy Horseman, PA-C 07/22/12 1238

## 2012-07-22 NOTE — Progress Notes (Signed)
pcp is [facility] per forms sent with pt in [facility] ED chart (Dr Nelson Chimes and Leonia Reader) EPIC updated

## 2012-07-22 NOTE — ED Provider Notes (Signed)
Medical screening examination/treatment/procedure(s) were conducted as a shared visit with non-physician practitioner(s) and myself.  I personally evaluated the patient during the encounter  4 days of tremors since starting depakote for depression. No history of seizures. No SOB, CP, fever, rash, AMS. Resting tremors of all extremities, worse in R hand. Tremor to lips and chin.  Doubt serotonin syndrome, NMS. Consider depakote toxicity, EPS. Depakote level normal. Symtoms improved with cogentin and ativan. depakote known to cause tremors as side effect. D/c depakote.    Glynn Octave, MD 07/22/12 [phone]

## 2012-07-22 NOTE — ED Notes (Signed)
Patient transported to CT 

## 2012-07-22 NOTE — ED Notes (Signed)
[id number]<JY> Expected date:<BR> Expected time:<BR> Means of arrival:<BR> Comments:<BR> Reaction to Depakote

## 2012-07-22 NOTE — ED Notes (Signed)
PER EMS- pt picked up from [facility] with c/o medication reaction to Depakote x4 days.  Reports that pt has been shaking. Given PO benadryl en route.  Denies SOB or any other complaints.  Pt alert and oriented.

## 2012-07-27 ENCOUNTER — Emergency Department (HOSPITAL_COMMUNITY)
Admission: EM | Admit: 2012-07-27 | Discharge: 2012-07-27 | Disposition: A | Discharge status: Home / Self Care | Payer: Medicare Other | Attending: Emergency Medicine | Admitting: Emergency Medicine

## 2012-07-27 ENCOUNTER — Encounter (HOSPITAL_COMMUNITY): Payer: Self-pay | Admitting: *Deleted

## 2012-07-27 ENCOUNTER — Emergency Department (HOSPITAL_COMMUNITY): Payer: Medicare Other

## 2012-07-27 DIAGNOSIS — M549 Dorsalgia, unspecified: Secondary | ICD-10-CM | POA: Insufficient documentation

## 2012-07-27 DIAGNOSIS — Y9389 Activity, other specified: Secondary | ICD-10-CM | POA: Insufficient documentation

## 2012-07-27 DIAGNOSIS — Z8709 Personal history of other diseases of the respiratory system: Secondary | ICD-10-CM | POA: Insufficient documentation

## 2012-07-27 DIAGNOSIS — F0390 Unspecified dementia without behavioral disturbance: Secondary | ICD-10-CM | POA: Insufficient documentation

## 2012-07-27 DIAGNOSIS — M542 Cervicalgia: Secondary | ICD-10-CM | POA: Insufficient documentation

## 2012-07-27 DIAGNOSIS — E119 Type 2 diabetes mellitus without complications: Secondary | ICD-10-CM | POA: Insufficient documentation

## 2012-07-27 DIAGNOSIS — IMO0002 Reserved for concepts with insufficient information to code with codable children: Secondary | ICD-10-CM | POA: Insufficient documentation

## 2012-07-27 DIAGNOSIS — M129 Arthropathy, unspecified: Secondary | ICD-10-CM | POA: Insufficient documentation

## 2012-07-27 DIAGNOSIS — S8000XA Contusion of unspecified knee, initial encounter: Secondary | ICD-10-CM | POA: Insufficient documentation

## 2012-07-27 DIAGNOSIS — K219 Gastro-esophageal reflux disease without esophagitis: Secondary | ICD-10-CM | POA: Insufficient documentation

## 2012-07-27 DIAGNOSIS — I10 Essential (primary) hypertension: Secondary | ICD-10-CM | POA: Insufficient documentation

## 2012-07-27 DIAGNOSIS — W010XXA Fall on same level from slipping, tripping and stumbling without subsequent striking against object, initial encounter: Secondary | ICD-10-CM | POA: Insufficient documentation

## 2012-07-27 DIAGNOSIS — Z79899 Other long term (current) drug therapy: Secondary | ICD-10-CM | POA: Insufficient documentation

## 2012-07-27 DIAGNOSIS — Y92009 Unspecified place in unspecified non-institutional (private) residence as the place of occurrence of the external cause: Secondary | ICD-10-CM | POA: Insufficient documentation

## 2012-07-27 DIAGNOSIS — G8929 Other chronic pain: Secondary | ICD-10-CM | POA: Insufficient documentation

## 2012-07-27 DIAGNOSIS — S20219A Contusion of unspecified front wall of thorax, initial encounter: Secondary | ICD-10-CM | POA: Insufficient documentation

## 2012-07-27 DIAGNOSIS — Z8719 Personal history of other diseases of the digestive system: Secondary | ICD-10-CM | POA: Insufficient documentation

## 2012-07-27 DIAGNOSIS — F319 Bipolar disorder, unspecified: Secondary | ICD-10-CM | POA: Insufficient documentation

## 2012-07-27 MED ORDER — HYDROCODONE-ACETAMINOPHEN 5-325 MG PO TABS: 1.0000 | ORAL_TABLET | ORAL | Status: DC | PRN

## 2012-07-27 MED ORDER — HYDROCODONE-ACETAMINOPHEN 5-325 MG PO TABS
1.0000 | ORAL_TABLET | Freq: Once | ORAL | Status: AC
  Administered 2012-07-27: 1 via ORAL
  Filled 2012-07-27: qty 1

## 2012-07-27 NOTE — ED Notes (Signed)
[facility] contacted for transport 

## 2012-07-27 NOTE — ED Provider Notes (Signed)
History     CSN: [id number]  Arrival date & time 07/27/12  1344   First MD Initiated Contact with Patient 07/27/12 1505      Chief Complaint  Patient presents with  . Fall     HPI Patient reports she was walking with her walker when she slipped and fell towards her left.  She reports pain in her left knee and left chest.  She denies weakness of her upper lower extremities.  She reports mild pain in her left lateral neck without midline discomfort.  No head injury.  She states that she struck a desk with her left side.  Her symptoms are mild in severity.  Pain is worse with range of motion of her left knee as well as palpation of her left chest.  No shortness of breath at this time.  She is on Plavix.  No headache or head injury.   Past Medical History  Diagnosis Date  . Hypertension   . GERD (gastroesophageal reflux disease)   . Dementia   . IBS (irritable bowel syndrome)   . Arthritis   . Constipation   . Hiatal hernia   . Bipolar 1 disorder   . Diabetes mellitus   . Bronchitis   . Chronic back pain     History reviewed. No pertinent past surgical history.  No family history on file.  History  Substance Use Topics  . Smoking status: Never Smoker   . Smokeless tobacco: Not on file  . Alcohol Use: No    OB History   Grav Para Term Preterm Abortions TAB SAB Ect Mult Living                  Review of Systems  All other systems reviewed and are negative.    Allergies  Aspirin; Codeine; Ivp dye; Morphine and related; Neomycin; Penicillins; Promethazine; Sulfa antibiotics; and Tetracyclines & related  Home Medications   Current Outpatient Rx  Name  Route  Sig  Dispense  Refill  . acetaminophen (TYLENOL) 325 MG tablet   Oral   Take 325 mg by mouth every 6 (six) hours as needed. Pain or fever         . albuterol (PROVENTIL,VENTOLIN) 90 MCG/ACT inhaler   Inhalation   Inhale 2 puffs into the lungs every 4 (four) hours as needed. Wheezing and shortness of  breath          . antiseptic oral rinse (BIOTENE) LIQD   Mouth Rinse   15 mLs by Mouth Rinse route at bedtime.         Marland Kitchen atorvastatin (LIPITOR) 40 MG tablet   Oral   Take 40 mg by mouth every morning.          . budesonide-formoterol (SYMBICORT) 160-4.5 MCG/ACT inhaler   Inhalation   Inhale 2 puffs into the lungs 2 (two) times daily.         . carvedilol (COREG) 6.25 MG tablet   Oral   Take 6.25 mg by mouth 2 (two) times daily with a meal.           . Cholecalciferol (VITAMIN D) 1000 UNITS capsule   Oral   Take 1,000 Units by mouth every morning.          . clopidogrel (PLAVIX) 75 MG tablet   Oral   Take 75 mg by mouth every morning.          . docusate sodium (COLACE) 100 MG capsule   Oral  Take 100 mg by mouth 2 (two) times daily.         Marland Kitchen donepezil (ARICEPT) 10 MG tablet   Oral   Take 10 mg by mouth every evening.          . fluticasone (FLONASE) 50 MCG/ACT nasal spray   Nasal   Place 2 sprays into the nose every morning.          . furosemide (LASIX) 40 MG tablet   Oral   Take 40 mg by mouth every morning.          . lamoTRIgine (LAMICTAL) 200 MG tablet   Oral   Take 200 mg by mouth 2 (two) times daily.         Marland Kitchen lisinopril (PRINIVIL,ZESTRIL) 40 MG tablet   Oral   Take 40 mg by mouth every morning.          . loratadine (CLARITIN) 10 MG tablet   Oral   Take 10 mg by mouth every morning.          Marland Kitchen LORazepam (ATIVAN) 1 MG tablet   Oral   Take 1 mg by mouth 2 (two) times daily.          . pantoprazole (PROTONIX) 40 MG tablet   Oral   Take 40 mg by mouth every morning.          . polyethylene glycol (MIRALAX / GLYCOLAX) packet   Oral   Take 17 g by mouth every morning.          Marland Kitchen QUEtiapine (SEROQUEL) 100 MG tablet   Oral   Take 100 mg by mouth 2 (two) times daily.         . verapamil (CALAN-SR) 120 MG CR tablet   Oral   Take 120 mg by mouth every morning.         . vitamin B-12 (CYANOCOBALAMIN) 1000  MCG tablet   Oral   Take 1,000 mcg by mouth every morning.          Marland Kitchen HYDROcodone-acetaminophen (NORCO/VICODIN) 5-325 MG per tablet   Oral   Take 1 tablet by mouth every 4 (four) hours as needed for pain.   15 tablet   0     BP 124/53  Pulse 65  Temp(Src) 97.4 F (36.3 C) (Oral)  Resp 16  SpO2 95%  Physical Exam  Nursing note and vitals reviewed. Constitutional: She is oriented to person, place, and time. She appears well-developed and well-nourished. No distress.  HENT:  Head: Normocephalic and atraumatic.  Eyes: EOM are normal.  Neck: Normal range of motion.  Cardiovascular: Normal rate, regular rhythm and normal heart sounds.   Pulmonary/Chest: Effort normal and breath sounds normal.  Mild tenderness of left lateral chest wall without crepitus or bruising noted.  Abdominal: Soft. She exhibits no distension. There is no tenderness.  Musculoskeletal: Normal range of motion.  Small bruise of anterior left knee without overt left knee effusion.  Normal pulses in left foot.  Full range of motion of left knee and left hip.  Range of motion of right ankle right knee and right hip.  Full range of motion bilateral upper extremity major joints.  Mild tenderness over left iliac crest.  Full range of motion of left hip  Neurological: She is alert and oriented to person, place, and time.  Skin: Skin is warm and dry.  Psychiatric: She has a normal mood and affect. Judgment normal.    ED Course  Procedures (including  critical care time)  Labs Reviewed - No data to display Dg Chest 2 View  07/27/2012  *RADIOLOGY REPORT*  Clinical Data: Recent fall  CHEST - 2 VIEW  Comparison: Chest x-ray of 01/31/2011  Findings: The lungs are not optimally aerated.  However no infiltrate or effusion is seen.  Cardiomegaly is stable.  A battery pack overlies the left chest with the lead extending cephalad.  IMPRESSION: Poor inspiration.  Stable cardiomegaly.  No active process.   Original Report  Authenticated By: Dwyane Dee, M.D.    Dg Pelvis 1-2 Views  07/27/2012  *RADIOLOGY REPORT*  Clinical Data: Recent fall with left hip and knee pain  PELVIS - 1-2 VIEW  Comparison: Pelvis film of 11/11/2011  Findings: Both hip joint spaces appear symmetrical and normal for age.  No significant degenerative change is seen.  The pelvic rami are intact.  The SI joints appear corticated. There is degenerative change noted in the lower lumbar spine.  IMPRESSION: No acute fracture.   Original Report Authenticated By: Dwyane Dee, M.D.    Dg Knee Complete 4 Views Left  07/27/2012  *RADIOLOGY REPORT*  Clinical Data: Fall, left knee pain  LEFT KNEE - COMPLETE 4+ VIEW  Comparison: None.  Findings: Normal alignment without fracture or effusion.  Preserved joint spaces.  No significant arthritis or degenerative process.  IMPRESSION: No acute osseous finding.   Original Report Authenticated By: Judie Petit. Miles Costain, M.D.    I personally reviewed the imaging tests through PACS system I reviewed available ER/hospitalization records through the EMR   1. Fall, initial encounter   2. Contusion of left knee, initial encounter   3. Contusion of chest wall, left, initial encounter       MDM  Plain films normal.  Discharge home in good condition.  Home with Vicodin and PCP followup.        Lyanne Co, MD 07/27/12 [phone]

## 2012-07-27 NOTE — ED Notes (Signed)
Pt states that she was at home ([facility]) this morning in her bathroom when she slipped/ lost her balance due to tremors and trying to avoid clutter in the floor. She denies any LOC, but does c/o pain to her left side, and her neck, there is a small bruise to her left elbow but no other visible physical harm.

## 2012-10-05 ENCOUNTER — Emergency Department (HOSPITAL_COMMUNITY)
Admission: EM | Admit: 2012-10-05 | Discharge: 2012-10-06 | Disposition: A | Discharge status: Skilled Nursing Facility | Payer: Medicare Other | Attending: Emergency Medicine | Admitting: Emergency Medicine

## 2012-10-05 ENCOUNTER — Encounter (HOSPITAL_COMMUNITY): Payer: Self-pay | Admitting: Emergency Medicine

## 2012-10-05 ENCOUNTER — Emergency Department (HOSPITAL_COMMUNITY): Payer: Medicare Other

## 2012-10-05 DIAGNOSIS — M129 Arthropathy, unspecified: Secondary | ICD-10-CM | POA: Insufficient documentation

## 2012-10-05 DIAGNOSIS — R059 Cough, unspecified: Secondary | ICD-10-CM | POA: Insufficient documentation

## 2012-10-05 DIAGNOSIS — K5900 Constipation, unspecified: Secondary | ICD-10-CM | POA: Insufficient documentation

## 2012-10-05 DIAGNOSIS — K219 Gastro-esophageal reflux disease without esophagitis: Secondary | ICD-10-CM | POA: Insufficient documentation

## 2012-10-05 DIAGNOSIS — I10 Essential (primary) hypertension: Secondary | ICD-10-CM | POA: Insufficient documentation

## 2012-10-05 DIAGNOSIS — J3489 Other specified disorders of nose and nasal sinuses: Secondary | ICD-10-CM | POA: Insufficient documentation

## 2012-10-05 DIAGNOSIS — Z8719 Personal history of other diseases of the digestive system: Secondary | ICD-10-CM | POA: Insufficient documentation

## 2012-10-05 DIAGNOSIS — Z7902 Long term (current) use of antithrombotics/antiplatelets: Secondary | ICD-10-CM | POA: Insufficient documentation

## 2012-10-05 DIAGNOSIS — R062 Wheezing: Secondary | ICD-10-CM | POA: Insufficient documentation

## 2012-10-05 DIAGNOSIS — J441 Chronic obstructive pulmonary disease with (acute) exacerbation: Secondary | ICD-10-CM | POA: Insufficient documentation

## 2012-10-05 DIAGNOSIS — F319 Bipolar disorder, unspecified: Secondary | ICD-10-CM | POA: Insufficient documentation

## 2012-10-05 DIAGNOSIS — R05 Cough: Secondary | ICD-10-CM | POA: Insufficient documentation

## 2012-10-05 DIAGNOSIS — M549 Dorsalgia, unspecified: Secondary | ICD-10-CM | POA: Insufficient documentation

## 2012-10-05 DIAGNOSIS — G479 Sleep disorder, unspecified: Secondary | ICD-10-CM | POA: Insufficient documentation

## 2012-10-05 DIAGNOSIS — Z8709 Personal history of other diseases of the respiratory system: Secondary | ICD-10-CM | POA: Insufficient documentation

## 2012-10-05 DIAGNOSIS — K589 Irritable bowel syndrome without diarrhea: Secondary | ICD-10-CM | POA: Insufficient documentation

## 2012-10-05 DIAGNOSIS — E119 Type 2 diabetes mellitus without complications: Secondary | ICD-10-CM | POA: Insufficient documentation

## 2012-10-05 DIAGNOSIS — Z880 Allergy status to penicillin: Secondary | ICD-10-CM | POA: Insufficient documentation

## 2012-10-05 DIAGNOSIS — IMO0002 Reserved for concepts with insufficient information to code with codable children: Secondary | ICD-10-CM | POA: Insufficient documentation

## 2012-10-05 DIAGNOSIS — Z79899 Other long term (current) drug therapy: Secondary | ICD-10-CM | POA: Insufficient documentation

## 2012-10-05 DIAGNOSIS — F0390 Unspecified dementia without behavioral disturbance: Secondary | ICD-10-CM | POA: Insufficient documentation

## 2012-10-05 DIAGNOSIS — R093 Abnormal sputum: Secondary | ICD-10-CM | POA: Insufficient documentation

## 2012-10-05 DIAGNOSIS — G8929 Other chronic pain: Secondary | ICD-10-CM | POA: Insufficient documentation

## 2012-10-05 LAB — CBC WITH DIFFERENTIAL/PLATELET
Basophils Absolute: 0 10*3/uL (ref 0.0–0.1)
HCT: 33 % — ABNORMAL LOW (ref 36.0–46.0)
Lymphocytes Relative: 21 % (ref 12–46)
Lymphs Abs: 1.9 10*3/uL (ref 0.7–4.0)
MCV: 84 fL (ref 78.0–100.0)
Neutro Abs: 5.9 10*3/uL (ref 1.7–7.7)
Platelets: 240 10*3/uL (ref 150–400)
RBC: 3.93 MIL/uL (ref 3.87–5.11)
RDW: 15 % (ref 11.5–15.5)
WBC: 8.9 10*3/uL (ref 4.0–10.5)

## 2012-10-05 LAB — PRO B NATRIURETIC PEPTIDE: Pro B Natriuretic peptide (BNP): 384.5 pg/mL — ABNORMAL HIGH (ref 0–125)

## 2012-10-05 LAB — BASIC METABOLIC PANEL
CO2: 27 mEq/L (ref 19–32)
Chloride: 95 mEq/L — ABNORMAL LOW (ref 96–112)
Glucose, Bld: 125 mg/dL — ABNORMAL HIGH (ref 70–99)
Sodium: 132 mEq/L — ABNORMAL LOW (ref 135–145)

## 2012-10-05 MED ORDER — IPRATROPIUM BROMIDE 0.02 % IN SOLN
0.5000 mg | Freq: Once | RESPIRATORY_TRACT | Status: AC
  Administered 2012-10-05: 0.5 mg via RESPIRATORY_TRACT
  Filled 2012-10-05: qty 2.5

## 2012-10-05 MED ORDER — ALBUTEROL SULFATE (5 MG/ML) 0.5% IN NEBU
5.0000 mg | INHALATION_SOLUTION | Freq: Once | RESPIRATORY_TRACT | Status: AC
  Administered 2012-10-05: 5 mg via RESPIRATORY_TRACT
  Filled 2012-10-05: qty 1

## 2012-10-05 NOTE — ED Notes (Signed)
Pt ambulated well in the hall with minimal assist (pt states she uses a walker at the assisted living home)-- pt's O2 sat remained 95% on room air during ambulation.

## 2012-10-05 NOTE — ED Provider Notes (Signed)
History     CSN: [id number]  Arrival date & time 10/05/12  2142   First MD Initiated Contact with Patient 10/05/12 2152      Chief Complaint  Patient presents with  . Shortness of Breath  . Cough   HPI  History provided by patient and nursing home report. Patient is a 72 year old female with history of hypertension, diabetes, bipolar disorder, COPD and mild dementia who presents from nursing home with increased shortness of breath and productive cough. Patient reports having increased rhinorrhea and slight nasal congestion and began one to 2 days ago. She also began developing slight increased cough which is occasionally productive of yellow or clear phlegm. Symptoms worsened today and did not seem improved with her own Symbicort or albuterol inhaler. Patient was having difficulty resting or trying to sleep and was sent for further evaluation. She denies any recent fever, chills or sweats. Denies any significant chest pain but does have occasional soreness with cough. No heart palpitations. Denies any increased swelling in extremities. Denies any increased weakness or fatigue. 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    Past Medical History  Diagnosis Date  . Hypertension   . GERD (gastroesophageal reflux disease)   . Dementia   . IBS (irritable bowel syndrome)   . Arthritis   . Constipation   . Hiatal hernia   . Bipolar 1 disorder   . Diabetes mellitus   . Bronchitis   . Chronic back pain     History reviewed. No pertinent past surgical history.  History reviewed. No pertinent family history.  History  Substance Use Topics  . Smoking status: Never Smoker   . Smokeless tobacco: Not on file  . Alcohol Use: No    OB History   Grav Para Term Preterm Abortions TAB SAB Ect Mult Living                  Review of Systems  Constitutional: Negative for fever, chills and diaphoresis.  HENT: Positive for rhinorrhea. Negative for ear pain and sore throat.    Respiratory: Positive for cough, shortness of breath and wheezing.   Cardiovascular: Negative for palpitations.  Gastrointestinal: Negative for nausea, vomiting, diarrhea and constipation.  All other systems reviewed and are negative.    Allergies  Aspirin; Codeine; Ivp dye; Morphine and related; Neomycin; Penicillins; Promethazine; Sulfa antibiotics; and Tetracyclines & related  Home Medications   Current Outpatient Rx  Name  Route  Sig  Dispense  Refill  . albuterol (PROVENTIL,VENTOLIN) 90 MCG/ACT inhaler   Inhalation   Inhale 2 puffs into the lungs every 4 (four) hours as needed. Wheezing and shortness of breath          . antiseptic oral rinse (BIOTENE) LIQD   Mouth Rinse   15 mLs by Mouth Rinse route at bedtime.         Marland Kitchen atorvastatin (LIPITOR) 40 MG tablet   Oral   Take 40 mg by mouth every morning.          . budesonide-formoterol (SYMBICORT) 160-4.5 MCG/ACT inhaler   Inhalation   Inhale 2 puffs into the lungs 2 (two) times daily.         . Cholecalciferol (VITAMIN D) 1000 UNITS capsule   Oral   Take 1,000 Units by mouth every morning.          . clopidogrel (PLAVIX) 75 MG tablet   Oral   Take 75 mg by mouth every morning.          Marland Kitchen  docusate sodium (COLACE) 100 MG capsule   Oral   Take 100 mg by mouth 2 (two) times daily.         Marland Kitchen donepezil (ARICEPT) 10 MG tablet   Oral   Take 10 mg by mouth every evening.          . fluticasone (FLONASE) 50 MCG/ACT nasal spray   Nasal   Place 2 sprays into the nose every morning.          . furosemide (LASIX) 40 MG tablet   Oral   Take 40 mg by mouth every morning.          Marland Kitchen HYDROcodone-acetaminophen (NORCO/VICODIN) 5-325 MG per tablet   Oral   Take 1 tablet by mouth every 4 (four) hours as needed for pain.   15 tablet   0   . lamoTRIgine (LAMICTAL) 200 MG tablet   Oral   Take 200 mg by mouth 2 (two) times daily.         Marland Kitchen lisinopril (PRINIVIL,ZESTRIL) 40 MG tablet   Oral   Take 40  mg by mouth every morning.          . loratadine (CLARITIN) 10 MG tablet   Oral   Take 10 mg by mouth every morning.          Marland Kitchen LORazepam (ATIVAN) 1 MG tablet   Oral   Take 1 mg by mouth 2 (two) times daily.          . pantoprazole (PROTONIX) 40 MG tablet   Oral   Take 40 mg by mouth every morning.          . polyethylene glycol (MIRALAX / GLYCOLAX) packet   Oral   Take 17 g by mouth every morning.          Marland Kitchen QUEtiapine (SEROQUEL) 100 MG tablet   Oral   Take 100 mg by mouth 2 (two) times daily.         . verapamil (CALAN-SR) 120 MG CR tablet   Oral   Take 120 mg by mouth every morning.         . vitamin B-12 (CYANOCOBALAMIN) 1000 MCG tablet   Oral   Take 1,000 mcg by mouth every morning.            BP 139/52  Temp(Src) 99 F (37.2 C) (Oral)  Resp 16  SpO2 100%  Physical Exam  Nursing note and vitals reviewed. Constitutional: She is oriented to person, place, and time. She appears well-developed and well-nourished. No distress.  HENT:  Head: Normocephalic.  Mouth/Throat: Oropharynx is clear and moist.  Neck: Normal range of motion. Neck supple. No JVD present.  Cardiovascular: Normal rate and regular rhythm.   Pulmonary/Chest: Effort normal. No respiratory distress. She has wheezes. She exhibits no tenderness.  Abdominal: Soft. There is no tenderness. There is no rebound and no guarding.  Musculoskeletal: Normal range of motion. She exhibits edema.  Bilateral lower extremity edema  Neurological: She is alert and oriented to person, place, and time.  Skin: Skin is warm and dry. No rash noted.  Psychiatric: She has a normal mood and affect. Her behavior is normal.    ED Course  Procedures   Results for orders placed during the hospital encounter of 10/05/12  CBC WITH DIFFERENTIAL      Result Value Range   WBC 8.9  4.0 - 10.5 K/uL   RBC 3.93  3.87 - 5.11 MIL/uL   Hemoglobin 10.6 (*) 12.0 -  15.0 g/dL   HCT 62.1 (*) 30.8 - 65.7 %   MCV 84.0   78.0 - 100.0 fL   MCH 27.0  26.0 - 34.0 pg   MCHC 32.1  30.0 - 36.0 g/dL   RDW 84.6  96.2 - 95.2 %   Platelets 240  150 - 400 K/uL   Neutrophils Relative 66  43 - 77 %   Neutro Abs 5.9  1.7 - 7.7 K/uL   Lymphocytes Relative 21  12 - 46 %   Lymphs Abs 1.9  0.7 - 4.0 K/uL   Monocytes Relative 11  3 - 12 %   Monocytes Absolute 0.9  0.1 - 1.0 K/uL   Eosinophils Relative 2  0 - 5 %   Eosinophils Absolute 0.2  0.0 - 0.7 K/uL   Basophils Relative 0  0 - 1 %   Basophils Absolute 0.0  0.0 - 0.1 K/uL  BASIC METABOLIC PANEL      Result Value Range   Sodium 132 (*) 135 - 145 mEq/L   Potassium 3.6  3.5 - 5.1 mEq/L   Chloride 95 (*) 96 - 112 mEq/L   CO2 27  19 - 32 mEq/L   Glucose, Bld 125 (*) 70 - 99 mg/dL   BUN 12  6 - 23 mg/dL   Creatinine, Ser 8.41  0.50 - 1.10 mg/dL   Calcium 9.1  8.4 - 32.4 mg/dL   GFR calc non Af Amer 72 (*) >90 mL/min   GFR calc Af Amer 83 (*) >90 mL/min  PRO B NATRIURETIC PEPTIDE      Result Value Range   Pro B Natriuretic peptide (BNP) 384.5 (*) 0 - 125 pg/mL      Dg Chest 2 View  10/05/2012  *RADIOLOGY REPORT*  Clinical Data: Shortness of breath, COPD.  CHEST - 2 VIEW  Comparison: 07/27/2012  Findings: Heart is borderline enlarged.  Lungs are clear.  No effusions or edema.  No acute bony abnormality.  Stable battery pack in the left chest wall.  IMPRESSION: No acute cardiopulmonary disease.   Original Report Authenticated By: Charlett Nose, M.D.      1. Wheezing       MDM  9:50 PM patient seen and evaluated. Patient currently with normal respirations does not appear in any significant respiratory distress. Does have diffuse wheezing on exam. Will obtain basic labs, chest x-ray and provide breathing treatments.  Patient appears well with normal respirations. She has been ambulated and has been good O2 sats. Slightly elevated BNP however patient on Lasix. No overt signs of CHF. This time patient able to return to nursing facility.      Angus Seller,  PA-C 10/06/12 [phone]

## 2012-10-05 NOTE — ED Notes (Signed)
Writer monitor the pt O2 sats while ambulating, Pt's O2 was at 95% RM, HR at 61.

## 2012-10-05 NOTE — ED Notes (Signed)
Brought in by EMS from [facility] with c/o shortness of breath and cough.  Per EMS, pt reported that she has been having productive cough since yesterday, today pt became short of breath and called EMS; pt was given Albuterol 5 mg neb tx en route to ED.

## 2012-10-05 NOTE — ED Notes (Signed)
[id number]<AV> Expected date:<BR> Expected time:<BR> Means of arrival:<BR> Comments:<BR> EMS 30F from [facility], [facility], Cough, Alb neb given

## 2012-10-06 NOTE — ED Provider Notes (Signed)
Medical screening examination/treatment/procedure(s) were conducted as a shared visit with non-physician practitioner(s) and myself.  I personally evaluated the patient during the encounter  Pt with mild cough.  On my exam, no wheezing at rest after treatment. No signs of chf.  Stable for return to the nursing home.  Celene Kras, MD 10/06/12 [phone]

## 2013-01-08 ENCOUNTER — Encounter (HOSPITAL_COMMUNITY): Payer: Self-pay | Admitting: Emergency Medicine

## 2013-01-08 ENCOUNTER — Emergency Department (HOSPITAL_COMMUNITY): Payer: Medicare Other

## 2013-01-08 ENCOUNTER — Emergency Department (HOSPITAL_COMMUNITY)
Admission: EM | Admit: 2013-01-08 | Discharge: 2013-01-08 | Disposition: A | Discharge status: Home / Self Care | Payer: Medicare Other | Attending: Emergency Medicine | Admitting: Emergency Medicine

## 2013-01-08 DIAGNOSIS — R059 Cough, unspecified: Secondary | ICD-10-CM | POA: Insufficient documentation

## 2013-01-08 DIAGNOSIS — R0609 Other forms of dyspnea: Secondary | ICD-10-CM | POA: Insufficient documentation

## 2013-01-08 DIAGNOSIS — R0602 Shortness of breath: Secondary | ICD-10-CM | POA: Insufficient documentation

## 2013-01-08 DIAGNOSIS — K219 Gastro-esophageal reflux disease without esophagitis: Secondary | ICD-10-CM | POA: Insufficient documentation

## 2013-01-08 DIAGNOSIS — G8929 Other chronic pain: Secondary | ICD-10-CM | POA: Insufficient documentation

## 2013-01-08 DIAGNOSIS — Z8719 Personal history of other diseases of the digestive system: Secondary | ICD-10-CM | POA: Insufficient documentation

## 2013-01-08 DIAGNOSIS — J441 Chronic obstructive pulmonary disease with (acute) exacerbation: Secondary | ICD-10-CM | POA: Insufficient documentation

## 2013-01-08 DIAGNOSIS — R638 Other symptoms and signs concerning food and fluid intake: Secondary | ICD-10-CM | POA: Insufficient documentation

## 2013-01-08 DIAGNOSIS — R05 Cough: Secondary | ICD-10-CM | POA: Insufficient documentation

## 2013-01-08 DIAGNOSIS — K5900 Constipation, unspecified: Secondary | ICD-10-CM | POA: Insufficient documentation

## 2013-01-08 DIAGNOSIS — J029 Acute pharyngitis, unspecified: Secondary | ICD-10-CM | POA: Insufficient documentation

## 2013-01-08 DIAGNOSIS — E119 Type 2 diabetes mellitus without complications: Secondary | ICD-10-CM | POA: Insufficient documentation

## 2013-01-08 DIAGNOSIS — J069 Acute upper respiratory infection, unspecified: Secondary | ICD-10-CM

## 2013-01-08 DIAGNOSIS — R0989 Other specified symptoms and signs involving the circulatory and respiratory systems: Secondary | ICD-10-CM | POA: Insufficient documentation

## 2013-01-08 DIAGNOSIS — I10 Essential (primary) hypertension: Secondary | ICD-10-CM | POA: Insufficient documentation

## 2013-01-08 DIAGNOSIS — R6883 Chills (without fever): Secondary | ICD-10-CM | POA: Insufficient documentation

## 2013-01-08 DIAGNOSIS — Z79899 Other long term (current) drug therapy: Secondary | ICD-10-CM | POA: Insufficient documentation

## 2013-01-08 DIAGNOSIS — R498 Other voice and resonance disorders: Secondary | ICD-10-CM | POA: Insufficient documentation

## 2013-01-08 DIAGNOSIS — F0390 Unspecified dementia without behavioral disturbance: Secondary | ICD-10-CM | POA: Insufficient documentation

## 2013-01-08 DIAGNOSIS — Z8709 Personal history of other diseases of the respiratory system: Secondary | ICD-10-CM | POA: Insufficient documentation

## 2013-01-08 DIAGNOSIS — R062 Wheezing: Secondary | ICD-10-CM | POA: Insufficient documentation

## 2013-01-08 DIAGNOSIS — F319 Bipolar disorder, unspecified: Secondary | ICD-10-CM | POA: Insufficient documentation

## 2013-01-08 DIAGNOSIS — IMO0002 Reserved for concepts with insufficient information to code with codable children: Secondary | ICD-10-CM | POA: Insufficient documentation

## 2013-01-08 DIAGNOSIS — Z880 Allergy status to penicillin: Secondary | ICD-10-CM | POA: Insufficient documentation

## 2013-01-08 DIAGNOSIS — M129 Arthropathy, unspecified: Secondary | ICD-10-CM | POA: Insufficient documentation

## 2013-01-08 LAB — CBC WITH DIFFERENTIAL/PLATELET
Basophils Absolute: 0 10*3/uL (ref 0.0–0.1)
Basophils Relative: 0 % (ref 0–1)
Eosinophils Absolute: 0.2 10*3/uL (ref 0.0–0.7)
Eosinophils Relative: 2 % (ref 0–5)
HCT: 36.8 % (ref 36.0–46.0)
Hemoglobin: 12.2 g/dL (ref 12.0–15.0)
Lymphocytes Relative: 25 % (ref 12–46)
Lymphs Abs: 2.4 10*3/uL (ref 0.7–4.0)
MCH: 28.2 pg (ref 26.0–34.0)
MCHC: 33.2 g/dL (ref 30.0–36.0)
MCV: 85 fL (ref 78.0–100.0)
Monocytes Absolute: 1 10*3/uL (ref 0.1–1.0)
Monocytes Relative: 10 % (ref 3–12)
Neutro Abs: 6.1 10*3/uL (ref 1.7–7.7)
Neutrophils Relative %: 64 % (ref 43–77)
Platelets: 269 10*3/uL (ref 150–400)
RBC: 4.33 MIL/uL (ref 3.87–5.11)
RDW: 15.4 % (ref 11.5–15.5)
WBC: 9.6 10*3/uL (ref 4.0–10.5)

## 2013-01-08 LAB — BASIC METABOLIC PANEL
BUN: 13 mg/dL (ref 6–23)
CO2: 28 mEq/L (ref 19–32)
Calcium: 9.3 mg/dL (ref 8.4–10.5)
Chloride: 97 mEq/L (ref 96–112)
Creatinine, Ser: 0.87 mg/dL (ref 0.50–1.10)
GFR calc Af Amer: 75 mL/min — ABNORMAL LOW (ref >90)
GFR calc non Af Amer: 65 mL/min — ABNORMAL LOW (ref >90)
Glucose, Bld: 119 mg/dL — ABNORMAL HIGH (ref 70–99)
Potassium: 3.6 mEq/L (ref 3.5–5.1)
Sodium: 137 mEq/L (ref 135–145)

## 2013-01-08 MED ORDER — ALBUTEROL SULFATE (5 MG/ML) 0.5% IN NEBU
5.0000 mg | INHALATION_SOLUTION | Freq: Once | RESPIRATORY_TRACT | Status: AC
  Administered 2013-01-08: 5 mg via RESPIRATORY_TRACT
  Filled 2013-01-08: qty 1

## 2013-01-08 MED ORDER — ALBUTEROL SULFATE (5 MG/ML) 0.5% IN NEBU: 2.5000 mg | INHALATION_SOLUTION | RESPIRATORY_TRACT | Status: DC | PRN

## 2013-01-08 MED ORDER — GUAIFENESIN 100 MG/5ML PO LIQD: 100.0000 mg | ORAL | Status: DC | PRN

## 2013-01-08 MED ORDER — PREDNISONE 20 MG PO TABS: 40.0000 mg | ORAL_TABLET | Freq: Every day | ORAL | Status: DC

## 2013-01-08 MED ORDER — IPRATROPIUM BROMIDE 0.02 % IN SOLN
0.5000 mg | Freq: Once | RESPIRATORY_TRACT | Status: AC
  Administered 2013-01-08: 0.5 mg via RESPIRATORY_TRACT
  Filled 2013-01-08: qty 2.5

## 2013-01-08 MED ORDER — PREDNISONE 20 MG PO TABS
60.0000 mg | ORAL_TABLET | Freq: Once | ORAL | Status: AC
  Administered 2013-01-08: 60 mg via ORAL
  Filled 2013-01-08: qty 3

## 2013-01-08 NOTE — ED Notes (Signed)
Pt here via ems from [facility] for c/o swallowing problems started 01/06/2013  Pt now c/o some breathing problems today.on arrival aox3 pox 95% hr 62 resp 18

## 2013-01-08 NOTE — ED Notes (Signed)
Pt waiting for PTAR 

## 2013-01-08 NOTE — ED Provider Notes (Signed)
CSN: [id number]     Arrival date & time 01/08/13  1743 History     First MD Initiated Contact with Patient 01/08/13 1746     Chief Complaint  Patient presents with  . Dysphagia  . Respiratory Distress   (Consider location/radiation/quality/duration/timing/severity/associated sxs/prior Treatment) HPI Pt is a 72yo female with hx of COPD c/o dysphagia associated with SOB and chest congestion.  Pt states symptoms started Wednesday, 8/15 and have progressively worsened with a productive cough. Also reports mild sore throat, worse with swallowing. States she has tried to use nebulizer tx at [facility] but has to ask multiple times before receiving tx.  Pt states it helped minimally.  When pt cough, states it feels like there is something stuck in her throat.  Denies chest pain, fever, n/v/d.    Past Medical History  Diagnosis Date  . Hypertension   . GERD (gastroesophageal reflux disease)   . Dementia   . IBS (irritable bowel syndrome)   . Arthritis   . Constipation   . Hiatal hernia   . Bipolar 1 disorder   . Diabetes mellitus   . Bronchitis   . Chronic back pain    History reviewed. No pertinent past surgical history. History reviewed. No pertinent family history. History  Substance Use Topics  . Smoking status: Never Smoker   . Smokeless tobacco: Not on file  . Alcohol Use: No   OB History   Grav Para Term Preterm Abortions TAB SAB Ect Mult Living                 Review of Systems  Constitutional: Positive for chills and appetite change ( decreased). Negative for fever and diaphoresis.  HENT: Positive for sore throat, trouble swallowing and voice change.   Respiratory: Positive for cough, shortness of breath and wheezing.   Cardiovascular: Negative for chest pain.  Gastrointestinal: Negative for nausea, vomiting, abdominal pain and diarrhea.  All other systems reviewed and are negative.    Allergies  Aspirin; Codeine; Ivp dye; Morphine and related; Neomycin;  Penicillins; Promethazine; Sulfa antibiotics; and Tetracyclines & related  Home Medications   Current Outpatient Rx  Name  Route  Sig  Dispense  Refill  . acetaminophen (TYLENOL) 500 MG tablet   Oral   Take 500 mg by mouth every 4 (four) hours as needed for pain or fever.         Marland Kitchen albuterol (PROVENTIL,VENTOLIN) 90 MCG/ACT inhaler   Inhalation   Inhale 2 puffs into the lungs every 4 (four) hours as needed. Wheezing and shortness of breath          . alum & mag hydroxide-simeth (MAALOX/MYLANTA) 200-200-20 MG/5ML suspension   Oral   Take 30 mLs by mouth every 6 (six) hours as needed for indigestion.         Marland Kitchen antiseptic oral rinse (BIOTENE) LIQD   Mouth Rinse   15 mLs by Mouth Rinse route at bedtime.         Marland Kitchen atorvastatin (LIPITOR) 40 MG tablet   Oral   Take 40 mg by mouth every morning.          . budesonide-formoterol (SYMBICORT) 160-4.5 MCG/ACT inhaler   Inhalation   Inhale 2 puffs into the lungs 2 (two) times daily.         . carvedilol (COREG) 12.5 MG tablet   Oral   Take 12.5 mg by mouth 2 (two) times daily.         . Cholecalciferol (VITAMIN  D) 1000 UNITS capsule   Oral   Take 1,000 Units by mouth every morning.          . clopidogrel (PLAVIX) 75 MG tablet   Oral   Take 75 mg by mouth every morning.          . docusate sodium (COLACE) 100 MG capsule   Oral   Take 100 mg by mouth 2 (two) times daily.         Marland Kitchen donepezil (ARICEPT) 10 MG tablet   Oral   Take 10 mg by mouth every evening.          . fluticasone (FLONASE) 50 MCG/ACT nasal spray   Nasal   Place 2 sprays into the nose every morning.          . furosemide (LASIX) 40 MG tablet   Oral   Take 40 mg by mouth every morning.          . lamoTRIgine (LAMICTAL) 200 MG tablet   Oral   Take 200 mg by mouth 2 (two) times daily.         Marland Kitchen lisinopril (PRINIVIL,ZESTRIL) 40 MG tablet   Oral   Take 40 mg by mouth every morning.          . loperamide (IMODIUM A-D) 2 MG  tablet   Oral   Take 2 mg by mouth as needed for diarrhea or loose stools.         Marland Kitchen loratadine (CLARITIN) 10 MG tablet   Oral   Take 10 mg by mouth every morning.          Marland Kitchen LORazepam (ATIVAN) 1 MG tablet   Oral   Take 1 mg by mouth 2 (two) times daily.          . Magnesium Hydroxide (MILK OF MAGNESIA PO)   Oral   Take 30 mLs by mouth at bedtime as needed (as needed for constipation).         . magnesium oxide (MAG-OX) 400 MG tablet   Oral   Take 400 mg by mouth daily.         . pantoprazole (PROTONIX) 40 MG tablet   Oral   Take 40 mg by mouth every morning.          . polyethylene glycol (MIRALAX / GLYCOLAX) packet   Oral   Take 17 g by mouth every morning.          Marland Kitchen QUEtiapine (SEROQUEL) 100 MG tablet   Oral   Take 100 mg by mouth at bedtime.          . verapamil (CALAN-SR) 120 MG CR tablet   Oral   Take 120 mg by mouth every morning.         . vitamin B-12 (CYANOCOBALAMIN) 1000 MCG tablet   Oral   Take 1,000 mcg by mouth every morning.          Marland Kitchen albuterol (PROVENTIL) (5 MG/ML) 0.5% nebulizer solution   Nebulization   Take 0.5 mL (2.5 mg total) by nebulization every 4 (four) hours as needed for wheezing or shortness of breath.   20 mL   12   . guaiFENesin (ROBITUSSIN) 100 MG/5ML liquid   Oral   Take 5-10 mL (100-200 mg total) by mouth every 4 (four) hours as needed for cough.   60 mL   0   . predniSONE (DELTASONE) 20 MG tablet   Oral   Take 2 tablets (40 mg total) by  mouth daily. Take 40 mg by mouth daily for 3 days, then 20mg  by mouth daily for 3 days, then 10mg  daily for 3 days   12 tablet   0    BP 167/50  Pulse 70  Temp(Src) 99.8 F (37.7 C)  Resp 18  SpO2 95% Physical Exam  Nursing note and vitals reviewed. Constitutional: She appears well-developed and well-nourished. No distress.  Pt sitting up in bed, intermittent wet cough. No respiratory distress.   HENT:  Head: Normocephalic and atraumatic.  Eyes: Conjunctivae  are normal. No scleral icterus.  Neck: Normal range of motion. Neck supple.  Cardiovascular: Normal rate, regular rhythm and normal heart sounds.   Pulmonary/Chest: Effort normal. No stridor. No respiratory distress. She has wheezes. She has rales. She exhibits no tenderness.  Decreased breath sounds throughout with diffuse mild wheeze, bibasilar rales.  No respiratory distress. Able to speak in full sentences.   Abdominal: Soft. Bowel sounds are normal. She exhibits no distension and no mass. There is no tenderness. There is no rebound and no guarding.  Musculoskeletal: Normal range of motion.  Neurological: She is alert.  Skin: Skin is warm and dry. She is not diaphoretic.    ED Course   Procedures (including critical care time)  Labs Reviewed  BASIC METABOLIC PANEL - Abnormal; Notable for the following:    Glucose, Bld 119 (*)    GFR calc non Af Amer 65 (*)    GFR calc Af Amer 75 (*)    All other components within normal limits  CBC WITH DIFFERENTIAL  URINALYSIS, ROUTINE W REFLEX MICROSCOPIC   Dg Chest Portable 1 View  01/08/2013   *RADIOLOGY REPORT*  Clinical Data: Dysphagia and respiratory distress  PORTABLE CHEST - 1 VIEW  Comparison: Oct 05, 2012  Findings: Lungs clear.  Heart is upper normal in size with normal pulmonary vascularity.  No adenopathy. No pneumothorax.  There is a stimulator with tips in left lower neck region.  IMPRESSION: No edema or consolidation.   Original Report Authenticated By: Bretta Bang, M.D.   1. URI, acute     MDM  DDx: COPD exacerbation, pneumonia, pharyngitis, sinusitis.  Will get CBC, BMP, UA, and CXR.  Tx: prednisone, albuterol, Atrovent neb    Junius Finner, PA-C 01/08/13 2008

## 2013-01-08 NOTE — ED Notes (Signed)
Bed: WA09 Expected date:  Expected time:  Means of arrival:  Comments: EMS-throat issues

## 2013-01-09 NOTE — ED Provider Notes (Signed)
Medical screening examination/treatment/procedure(s) were conducted as a shared visit with non-physician practitioner(s) and myself.  I personally evaluated the patient during the encounter   No acute distress. Mild bronchospasm on exam, CXR neg for PNA or fluid overload. Improved with albuterol. Will discharge with steroids and nebs and f/u with PCP  Audree Camel, MD 01/09/13 1154

## 2013-02-09 ENCOUNTER — Emergency Department (HOSPITAL_COMMUNITY): Payer: Medicare Other

## 2013-02-09 ENCOUNTER — Encounter (HOSPITAL_COMMUNITY): Payer: Self-pay

## 2013-02-09 ENCOUNTER — Inpatient Hospital Stay (HOSPITAL_COMMUNITY)
Admission: EM | Admit: 2013-02-09 | Discharge: 2013-02-11 | DRG: 641 | Disposition: A | Discharge status: Home Health | Payer: Medicare Other | Attending: Internal Medicine | Admitting: Internal Medicine

## 2013-02-09 DIAGNOSIS — R5381 Other malaise: Secondary | ICD-10-CM | POA: Diagnosis present

## 2013-02-09 DIAGNOSIS — E871 Hypo-osmolality and hyponatremia: Secondary | ICD-10-CM | POA: Diagnosis present

## 2013-02-09 DIAGNOSIS — K219 Gastro-esophageal reflux disease without esophagitis: Secondary | ICD-10-CM | POA: Diagnosis present

## 2013-02-09 DIAGNOSIS — I10 Essential (primary) hypertension: Secondary | ICD-10-CM | POA: Diagnosis present

## 2013-02-09 DIAGNOSIS — R0602 Shortness of breath: Secondary | ICD-10-CM | POA: Diagnosis present

## 2013-02-09 DIAGNOSIS — T502X5A Adverse effect of carbonic-anhydrase inhibitors, benzothiadiazides and other diuretics, initial encounter: Secondary | ICD-10-CM | POA: Diagnosis present

## 2013-02-09 DIAGNOSIS — F3289 Other specified depressive episodes: Secondary | ICD-10-CM | POA: Diagnosis present

## 2013-02-09 DIAGNOSIS — R531 Weakness: Secondary | ICD-10-CM | POA: Diagnosis present

## 2013-02-09 DIAGNOSIS — F329 Major depressive disorder, single episode, unspecified: Secondary | ICD-10-CM | POA: Diagnosis present

## 2013-02-09 DIAGNOSIS — J441 Chronic obstructive pulmonary disease with (acute) exacerbation: Secondary | ICD-10-CM

## 2013-02-09 DIAGNOSIS — E785 Hyperlipidemia, unspecified: Secondary | ICD-10-CM | POA: Diagnosis present

## 2013-02-09 DIAGNOSIS — J40 Bronchitis, not specified as acute or chronic: Secondary | ICD-10-CM

## 2013-02-09 DIAGNOSIS — F0390 Unspecified dementia without behavioral disturbance: Secondary | ICD-10-CM | POA: Diagnosis present

## 2013-02-09 DIAGNOSIS — F411 Generalized anxiety disorder: Secondary | ICD-10-CM | POA: Diagnosis present

## 2013-02-09 DIAGNOSIS — E876 Hypokalemia: Secondary | ICD-10-CM | POA: Diagnosis present

## 2013-02-09 LAB — BASIC METABOLIC PANEL
BUN: 26 mg/dL — ABNORMAL HIGH (ref 6–23)
CO2: 26 mEq/L (ref 19–32)
Calcium: 9.7 mg/dL (ref 8.4–10.5)
Chloride: 83 mEq/L — ABNORMAL LOW (ref 96–112)
Creatinine, Ser: 0.97 mg/dL (ref 0.50–1.10)
GFR calc Af Amer: 66 mL/min — ABNORMAL LOW (ref >90)

## 2013-02-09 LAB — URINE MICROSCOPIC-ADD ON

## 2013-02-09 LAB — URINALYSIS, ROUTINE W REFLEX MICROSCOPIC
Bilirubin Urine: NEGATIVE
Glucose, UA: NEGATIVE mg/dL
Ketones, ur: NEGATIVE mg/dL
Protein, ur: NEGATIVE mg/dL
pH: 6.5 (ref 5.0–8.0)

## 2013-02-09 LAB — CBC WITH DIFFERENTIAL/PLATELET
Basophils Relative: 0 % (ref 0–1)
Eosinophils Relative: 1 % (ref 0–5)
HCT: 38.4 % (ref 36.0–46.0)
Hemoglobin: 13.5 g/dL (ref 12.0–15.0)
MCHC: 35.2 g/dL (ref 30.0–36.0)
MCV: 81.2 fL (ref 78.0–100.0)
Monocytes Absolute: 1.4 10*3/uL — ABNORMAL HIGH (ref 0.1–1.0)
Monocytes Relative: 12 % (ref 3–12)
Neutro Abs: 6 10*3/uL (ref 1.7–7.7)
RDW: 14.9 % (ref 11.5–15.5)

## 2013-02-09 MED ORDER — ONDANSETRON HCL 4 MG/2ML IJ SOLN: 4.0000 mg | Freq: Three times a day (TID) | INTRAMUSCULAR | Status: DC | PRN

## 2013-02-09 MED ORDER — VITAMIN B-12 1000 MCG PO TABS
1000.0000 ug | ORAL_TABLET | Freq: Every morning | ORAL | Status: DC
  Administered 2013-02-09 – 2013-02-11 (×3): 1000 ug via ORAL
  Filled 2013-02-09 (×3): qty 1

## 2013-02-09 MED ORDER — CARVEDILOL 12.5 MG PO TABS
12.5000 mg | ORAL_TABLET | Freq: Two times a day (BID) | ORAL | Status: DC
  Administered 2013-02-09 – 2013-02-11 (×4): 12.5 mg via ORAL
  Filled 2013-02-09 (×6): qty 1

## 2013-02-09 MED ORDER — VERAPAMIL HCL ER 120 MG PO TBCR
120.0000 mg | EXTENDED_RELEASE_TABLET | Freq: Every morning | ORAL | Status: DC
  Administered 2013-02-09 – 2013-02-11 (×3): 120 mg via ORAL
  Filled 2013-02-09 (×3): qty 1

## 2013-02-09 MED ORDER — LORATADINE 10 MG PO TABS
10.0000 mg | ORAL_TABLET | Freq: Every morning | ORAL | Status: DC
  Administered 2013-02-09 – 2013-02-11 (×3): 10 mg via ORAL
  Filled 2013-02-09 (×3): qty 1

## 2013-02-09 MED ORDER — LAMOTRIGINE 200 MG PO TABS
200.0000 mg | ORAL_TABLET | Freq: Two times a day (BID) | ORAL | Status: DC
  Administered 2013-02-09 – 2013-02-11 (×4): 200 mg via ORAL
  Filled 2013-02-09 (×5): qty 1

## 2013-02-09 MED ORDER — VITAMIN D3 25 MCG (1000 UNIT) PO TABS
1000.0000 [IU] | ORAL_TABLET | Freq: Every day | ORAL | Status: DC
  Administered 2013-02-09 – 2013-02-11 (×3): 1000 [IU] via ORAL
  Filled 2013-02-09 (×3): qty 1

## 2013-02-09 MED ORDER — ALBUTEROL SULFATE (5 MG/ML) 0.5% IN NEBU
5.0000 mg | INHALATION_SOLUTION | Freq: Once | RESPIRATORY_TRACT | Status: AC
  Administered 2013-02-09: 5 mg via RESPIRATORY_TRACT

## 2013-02-09 MED ORDER — BUDESONIDE-FORMOTEROL FUMARATE 160-4.5 MCG/ACT IN AERO
2.0000 | INHALATION_SPRAY | Freq: Two times a day (BID) | RESPIRATORY_TRACT | Status: DC
  Administered 2013-02-09 – 2013-02-11 (×4): 2 via RESPIRATORY_TRACT
  Filled 2013-02-09: qty 6

## 2013-02-09 MED ORDER — GI COCKTAIL ~~LOC~~
30.0000 mL | Freq: Once | ORAL | Status: AC
  Administered 2013-02-09: 30 mL via ORAL
  Filled 2013-02-09: qty 30

## 2013-02-09 MED ORDER — ENOXAPARIN SODIUM 40 MG/0.4ML ~~LOC~~ SOLN
40.0000 mg | SUBCUTANEOUS | Status: DC
  Administered 2013-02-09 – 2013-02-10 (×2): 40 mg via SUBCUTANEOUS
  Filled 2013-02-09 (×3): qty 0.4

## 2013-02-09 MED ORDER — LORAZEPAM 2 MG/ML IJ SOLN
0.5000 mg | Freq: Once | INTRAMUSCULAR | Status: AC
  Administered 2013-02-09: 0.5 mg via INTRAVENOUS
  Filled 2013-02-09: qty 1

## 2013-02-09 MED ORDER — PANTOPRAZOLE SODIUM 40 MG PO TBEC
40.0000 mg | DELAYED_RELEASE_TABLET | Freq: Every morning | ORAL | Status: DC
  Administered 2013-02-10 – 2013-02-11 (×2): 40 mg via ORAL
  Filled 2013-02-09 (×3): qty 1

## 2013-02-09 MED ORDER — PREDNISONE 20 MG PO TABS
60.0000 mg | ORAL_TABLET | Freq: Once | ORAL | Status: AC
  Administered 2013-02-09: 60 mg via ORAL
  Filled 2013-02-09: qty 3

## 2013-02-09 MED ORDER — ATORVASTATIN CALCIUM 40 MG PO TABS
40.0000 mg | ORAL_TABLET | Freq: Every morning | ORAL | Status: DC
  Administered 2013-02-09 – 2013-02-11 (×3): 40 mg via ORAL
  Filled 2013-02-09 (×3): qty 1

## 2013-02-09 MED ORDER — ONDANSETRON HCL 4 MG PO TABS: 4.0000 mg | ORAL_TABLET | Freq: Four times a day (QID) | ORAL | Status: DC | PRN

## 2013-02-09 MED ORDER — SENNOSIDES-DOCUSATE SODIUM 8.6-50 MG PO TABS
1.0000 | ORAL_TABLET | Freq: Every evening | ORAL | Status: DC | PRN
  Filled 2013-02-09: qty 1

## 2013-02-09 MED ORDER — SODIUM CHLORIDE 0.9 % IV BOLUS (SEPSIS)
500.0000 mL | Freq: Once | INTRAVENOUS | Status: AC
  Administered 2013-02-09: 500 mL via INTRAVENOUS

## 2013-02-09 MED ORDER — IPRATROPIUM BROMIDE 0.02 % IN SOLN
0.5000 mg | Freq: Once | RESPIRATORY_TRACT | Status: AC
  Administered 2013-02-09: 0.5 mg via RESPIRATORY_TRACT
  Filled 2013-02-09: qty 2.5

## 2013-02-09 MED ORDER — ONDANSETRON HCL 4 MG/2ML IJ SOLN: 4.0000 mg | Freq: Four times a day (QID) | INTRAMUSCULAR | Status: DC | PRN

## 2013-02-09 MED ORDER — ALBUTEROL SULFATE HFA 108 (90 BASE) MCG/ACT IN AERS
2.0000 | INHALATION_SPRAY | RESPIRATORY_TRACT | Status: DC | PRN
  Filled 2013-02-09: qty 6.7

## 2013-02-09 MED ORDER — LORAZEPAM 1 MG PO TABS
1.0000 mg | ORAL_TABLET | Freq: Two times a day (BID) | ORAL | Status: DC
  Administered 2013-02-09 – 2013-02-11 (×4): 1 mg via ORAL
  Filled 2013-02-09 (×4): qty 1

## 2013-02-09 MED ORDER — ALBUTEROL SULFATE (5 MG/ML) 0.5% IN NEBU
2.5000 mg | INHALATION_SOLUTION | Freq: Once | RESPIRATORY_TRACT | Status: AC
  Administered 2013-02-09: 2.5 mg via RESPIRATORY_TRACT
  Filled 2013-02-09: qty 0.5

## 2013-02-09 MED ORDER — QUETIAPINE FUMARATE 100 MG PO TABS
100.0000 mg | ORAL_TABLET | Freq: Every day | ORAL | Status: DC
  Administered 2013-02-09 – 2013-02-10 (×2): 100 mg via ORAL
  Filled 2013-02-09 (×3): qty 1

## 2013-02-09 MED ORDER — ALBUTEROL 90 MCG/ACT IN AERS: 2.0000 | INHALATION_SPRAY | RESPIRATORY_TRACT | Status: DC | PRN

## 2013-02-09 MED ORDER — FLUTICASONE PROPIONATE 50 MCG/ACT NA SUSP
2.0000 | Freq: Every morning | NASAL | Status: DC
  Administered 2013-02-09 – 2013-02-11 (×3): 2 via NASAL
  Filled 2013-02-09: qty 16

## 2013-02-09 MED ORDER — DONEPEZIL HCL 10 MG PO TABS
10.0000 mg | ORAL_TABLET | Freq: Every day | ORAL | Status: DC
  Administered 2013-02-09 – 2013-02-10 (×2): 10 mg via ORAL
  Filled 2013-02-09 (×3): qty 1

## 2013-02-09 MED ORDER — CLOPIDOGREL BISULFATE 75 MG PO TABS
75.0000 mg | ORAL_TABLET | Freq: Every morning | ORAL | Status: DC
  Administered 2013-02-09 – 2013-02-11 (×3): 75 mg via ORAL
  Filled 2013-02-09 (×3): qty 1

## 2013-02-09 MED ORDER — ACETAMINOPHEN 650 MG RE SUPP: 650.0000 mg | Freq: Four times a day (QID) | RECTAL | Status: DC | PRN

## 2013-02-09 MED ORDER — SODIUM CHLORIDE 0.9 % IV SOLN: INTRAVENOUS | Status: DC

## 2013-02-09 MED ORDER — LISINOPRIL 40 MG PO TABS
40.0000 mg | ORAL_TABLET | Freq: Every morning | ORAL | Status: DC
  Administered 2013-02-09 – 2013-02-11 (×3): 40 mg via ORAL
  Filled 2013-02-09 (×3): qty 1

## 2013-02-09 MED ORDER — ACETAMINOPHEN 325 MG PO TABS: 650.0000 mg | ORAL_TABLET | Freq: Four times a day (QID) | ORAL | Status: DC | PRN

## 2013-02-09 MED ORDER — POLYETHYLENE GLYCOL 3350 17 G PO PACK
17.0000 g | PACK | Freq: Every morning | ORAL | Status: DC
  Administered 2013-02-10 – 2013-02-11 (×2): 17 g via ORAL
  Filled 2013-02-09 (×3): qty 1

## 2013-02-09 MED ORDER — SODIUM CHLORIDE 0.9 % IV SOLN
INTRAVENOUS | Status: DC
  Administered 2013-02-09 – 2013-02-11 (×4): via INTRAVENOUS

## 2013-02-09 MED ORDER — DOCUSATE SODIUM 100 MG PO CAPS
100.0000 mg | ORAL_CAPSULE | Freq: Two times a day (BID) | ORAL | Status: DC
  Administered 2013-02-09 – 2013-02-11 (×4): 100 mg via ORAL
  Filled 2013-02-09 (×5): qty 1

## 2013-02-09 MED ORDER — POTASSIUM CHLORIDE CRYS ER 20 MEQ PO TBCR
40.0000 meq | EXTENDED_RELEASE_TABLET | ORAL | Status: AC
  Administered 2013-02-09 (×2): 40 meq via ORAL
  Filled 2013-02-09 (×2): qty 2

## 2013-02-09 MED ORDER — ALUM & MAG HYDROXIDE-SIMETH 200-200-20 MG/5ML PO SUSP
30.0000 mL | Freq: Four times a day (QID) | ORAL | Status: DC | PRN
  Administered 2013-02-09: 30 mL via ORAL
  Filled 2013-02-09: qty 30

## 2013-02-09 NOTE — Progress Notes (Signed)
Patient declines MyChart activation-patient does not have a computer

## 2013-02-09 NOTE — Progress Notes (Signed)
PT Cancellation Note  Patient Details Name: Cindy Padilla MRN: [id number] DOB: 31-Dec-1940   Cancelled Treatment:    Reason Eval/Treat Not Completed: Other--pt states she feels dizzy from ativan and prefers to defer eval to am. Will check back tomorrow morning. Thanks.    Rebeca Alert, MPT Pager: [phone]

## 2013-02-09 NOTE — Care Management Note (Signed)
   CARE MANAGEMENT NOTE 02/09/2013  Patient:  Cindy Padilla, Cindy Padilla   Account Number:  [id number]  Date Initiated:  02/09/2013  Documentation initiated by:  Roman Dubuc  Subjective/Objective Assessment:   72 yo female admitted with weakness and fatigue. PTA pt resides at [facility]     Action/Plan:   Home when stable   Anticipated DC Date:     Anticipated DC Plan:  HOME W HOME HEALTH SERVICES  In-house referral  Clinical Social Worker      DC Planning Services  CM consult      Choice offered to / List presented to:  NA   DME arranged  NA      DME agency  NA     HH arranged  NA      HH agency  NA   Status of service:  In process, will continue to follow Medicare Important Message given?   (If response is "NO", the following Medicare IM given date fields will be blank) Date Medicare IM given:   Date Additional Medicare IM given:    Discharge Disposition:    Per UR Regulation:  Reviewed for med. necessity/level of care/duration of stay  If discussed at Long Length of Stay Meetings, dates discussed:    Comments:  02/09/13 1500 Cordell Coke,RN,MSN [phone] Chart reviewed for utilization of services. PCP: Eloisa Northern, MD. Pt admitted with faigue, may require PT eval prior to discharge to assess for [facility] services.

## 2013-02-09 NOTE — H&P (Signed)
[facility]          History and Physical    PCP:   Eloisa Northern, MD   Chief Complaint:  Generalized weakness, fatigue  HPI: Pleasant 72 y/o woman with multiple medical comorbities most significant for : HTN, HLD, COPD. Who was sent from her ALF with complaints of mild SOB and extreme fatigue/weakness. She states this started all of a sudden today. In the ED she was found to have a Na of 124, K of 3.1. CXR negative. We were asked to admit her for further evaluation and management.  Allergies:   Allergies  Allergen Reactions  . Aspirin     unknown  . Codeine     unknown  . Ivp Dye [Iodinated Diagnostic Agents]     unknown  . Morphine And Related     unknown  . Neomycin     unknown  . Penicillins     unknown  . Promethazine     unknown  . Sulfa Antibiotics     unknown  . Tetracyclines & Related     unknown      Past Medical History  Diagnosis Date  . Hypertension   . GERD (gastroesophageal reflux disease)   . Dementia   . IBS (irritable bowel syndrome)   . Arthritis   . Constipation   . Hiatal hernia   . Bipolar 1 disorder   . Diabetes mellitus   . Bronchitis   . Chronic back pain     History reviewed. No pertinent past surgical history.  Prior to Admission medications   Medication Sig Start Date End Date Taking? Authorizing Provider  acetaminophen (TYLENOL) 500 MG tablet Take 500 mg by mouth every 4 (four) hours as needed for pain or fever.   Yes Historical Provider, MD  albuterol (PROVENTIL,VENTOLIN) 90 MCG/ACT inhaler Inhale 2 puffs into the lungs every 4 (four) hours as needed. Wheezing and shortness of breath    Yes Historical Provider, MD  alum & mag hydroxide-simeth (MAALOX/MYLANTA) 200-200-20 MG/5ML suspension Take 30 mLs by mouth every 6 (six) hours as needed for indigestion.   Yes Historical Provider, MD  antiseptic oral rinse (BIOTENE) LIQD 15 mLs by Mouth Rinse route at bedtime.   Yes Historical Provider, MD  atorvastatin  (LIPITOR) 40 MG tablet Take 40 mg by mouth every morning.    Yes Historical Provider, MD  budesonide-formoterol (SYMBICORT) 160-4.5 MCG/ACT inhaler Inhale 2 puffs into the lungs 2 (two) times daily.   Yes Historical Provider, MD  carvedilol (COREG) 12.5 MG tablet Take 12.5 mg by mouth 2 (two) times daily.   Yes Historical Provider, MD  cholecalciferol (VITAMIN D) 1000 UNITS tablet Take 1,000 Units by mouth daily.   Yes Historical Provider, MD  clopidogrel (PLAVIX) 75 MG tablet Take 75 mg by mouth every morning.    Yes Historical Provider, MD  docusate sodium (COLACE) 100 MG capsule Take 100 mg by mouth 2 (two) times daily.   Yes Historical Provider, MD  donepezil (ARICEPT) 10 MG tablet Take 10 mg by mouth every evening.    Yes Historical Provider, MD  fluticasone (FLONASE) 50 MCG/ACT nasal spray Place 2 sprays into the nose every morning.    Yes Historical Provider, MD  furosemide (LASIX) 40 MG tablet Take 40 mg by mouth every morning.    Yes Historical Provider, MD  guaiFENesin (ROBITUSSIN) 100 MG/5ML liquid Take 5-10 mL (100-200 mg total) by mouth every 4 (four) hours as needed for cough. 01/08/13  Yes Junius Finner,  PA-C  ipratropium-albuterol (DUONEB) 0.5-2.5 (3) MG/3ML SOLN Take 3 mLs by nebulization See admin instructions. Take 1 neb four times daily for 5 days(stops 02/08/13) then take 1 neb twice daily   Yes Historical Provider, MD  lamoTRIgine (LAMICTAL) 200 MG tablet Take 200 mg by mouth 2 (two) times daily.   Yes Historical Provider, MD  lisinopril (PRINIVIL,ZESTRIL) 40 MG tablet Take 40 mg by mouth every morning.    Yes Historical Provider, MD  loperamide (IMODIUM A-D) 2 MG tablet Take 2 mg by mouth as needed for diarrhea or loose stools.   Yes Historical Provider, MD  loratadine (CLARITIN) 10 MG tablet Take 10 mg by mouth every morning.    Yes Historical Provider, MD  LORazepam (ATIVAN) 1 MG tablet Take 1 mg by mouth every 12 (twelve) hours.    Yes Historical Provider, MD  Magnesium  Hydroxide (MILK OF MAGNESIA PO) Take 30 mLs by mouth at bedtime as needed (as needed for constipation).   Yes Historical Provider, MD  magnesium oxide (MAG-OX) 400 MG tablet Take 400 mg by mouth daily.   Yes Historical Provider, MD  metolazone (ZAROXOLYN) 5 MG tablet Take 5 mg by mouth See admin instructions. 1 tablet every day for 5 days (give 30 min prior to lasix) started 02/05/13 ends 02/09/13 02/05/13 02/09/13 Yes Historical Provider, MD  pantoprazole (PROTONIX) 40 MG tablet Take 40 mg by mouth every morning.    Yes Historical Provider, MD  polyethylene glycol (MIRALAX / GLYCOLAX) packet Take 17 g by mouth every morning.    Yes Historical Provider, MD  predniSONE (DELTASONE) 20 MG tablet Take 20 mg by mouth See admin instructions. 1 tablet daily for 5 days 02/05/13 02/09/13 Yes Historical Provider, MD  QUEtiapine (SEROQUEL) 100 MG tablet Take 100 mg by mouth at bedtime.    Yes Historical Provider, MD  verapamil (CALAN-SR) 120 MG CR tablet Take 120 mg by mouth every morning.   Yes Historical Provider, MD  vitamin B-12 (CYANOCOBALAMIN) 1000 MCG tablet Take 1,000 mcg by mouth every morning.    Yes Historical Provider, MD    Social History:  reports that she has never smoked. She has never used smokeless tobacco. She reports that she does not drink alcohol or use illicit drugs.  History reviewed. No pertinent family history.  Review of Systems:  Constitutional: Denies fever, chills, diaphoresis, appetite change.  HEENT: Denies photophobia, eye pain, redness, hearing loss, ear pain, congestion, sore throat, rhinorrhea, sneezing, mouth sores, trouble swallowing, neck pain, neck stiffness and tinnitus.   Respiratory: Denies SOB, DOE, cough, chest tightness,  and wheezing.   Cardiovascular: Denies chest pain, palpitations and leg swelling.  Gastrointestinal: Denies nausea, vomiting, abdominal pain, diarrhea, constipation, blood in stool and abdominal distention.  Genitourinary: Denies dysuria, urgency,  frequency, hematuria, flank pain and difficulty urinating.  Endocrine: Denies: hot or cold intolerance, sweats, changes in hair or nails, polyuria, polydipsia. Musculoskeletal: Denies myalgias, back pain, joint swelling, arthralgias and gait problem.  Skin: Denies pallor, rash and wound.  Neurological: Denies dizziness, seizures, syncope, weakness, light-headedness, numbness and headaches.  Hematological: Denies adenopathy. Easy bruising, personal or family bleeding history  Psychiatric/Behavioral: Denies suicidal ideation, mood changes, confusion, nervousness, sleep disturbance and agitation   Physical Exam: Blood pressure 147/83, pulse 63, temperature 98.6 F (37 C), temperature source Oral, resp. rate 19, SpO2 94.00%. Gen: AA Ox3, NAD HEENT: [place]/AT/PERRL/EOMI Neck: supple, no JVD, no LAD, no bruits, no goiter. CV: RRR, no M/R/G Lungs: CTA B Abd: obese/S/NT/ND/+BS/no masses or organomegaly.  Ext: no C/C/E/+pedal pulses. Neuro: grossly intact, non-focal.  Labs on Admission:  Results for orders placed during the hospital encounter of 02/09/13 (from the past 48 hour(s))  CBC WITH DIFFERENTIAL     Status: Abnormal   Collection Time    02/09/13  9:50 AM      Result Value Range   WBC 11.3 (*) 4.0 - 10.5 K/uL   RBC 4.73  3.87 - 5.11 MIL/uL   Hemoglobin 13.5  12.0 - 15.0 g/dL   HCT 40.9  81.1 - 91.4 %   MCV 81.2  78.0 - 100.0 fL   MCH 28.5  26.0 - 34.0 pg   MCHC 35.2  30.0 - 36.0 g/dL   RDW 78.2  95.6 - 21.3 %   Platelets 306  150 - 400 K/uL   Neutrophils Relative % 54  43 - 77 %   Neutro Abs 6.0  1.7 - 7.7 K/uL   Lymphocytes Relative 33  12 - 46 %   Lymphs Abs 3.7  0.7 - 4.0 K/uL   Monocytes Relative 12  3 - 12 %   Monocytes Absolute 1.4 (*) 0.1 - 1.0 K/uL   Eosinophils Relative 1  0 - 5 %   Eosinophils Absolute 0.1  0.0 - 0.7 K/uL   Basophils Relative 0  0 - 1 %   Basophils Absolute 0.0  0.0 - 0.1 K/uL  BASIC METABOLIC PANEL     Status: Abnormal   Collection Time    02/09/13   9:50 AM      Result Value Range   Sodium 124 (*) 135 - 145 mEq/L   Potassium 3.1 (*) 3.5 - 5.1 mEq/L   Chloride 83 (*) 96 - 112 mEq/L   CO2 26  19 - 32 mEq/L   Glucose, Bld 115 (*) 70 - 99 mg/dL   BUN 26 (*) 6 - 23 mg/dL   Creatinine, Ser 0.86  0.50 - 1.10 mg/dL   Calcium 9.7  8.4 - 57.8 mg/dL   GFR calc non Af Amer 57 (*) >90 mL/min   GFR calc Af Amer 66 (*) >90 mL/min   Comment: (NOTE)     The eGFR has been calculated using the CKD EPI equation.     This calculation has not been validated in all clinical situations.     eGFR's persistently <90 mL/min signify possible Chronic Kidney     Disease.  URINALYSIS, ROUTINE W REFLEX MICROSCOPIC     Status: Abnormal   Collection Time    02/09/13 11:37 AM      Result Value Range   Color, Urine YELLOW  YELLOW   APPearance CLEAR  CLEAR   Specific Gravity, Urine 1.016  1.005 - 1.030   pH 6.5  5.0 - 8.0   Glucose, UA NEGATIVE  NEGATIVE mg/dL   Hgb urine dipstick MODERATE (*) NEGATIVE   Bilirubin Urine NEGATIVE  NEGATIVE   Ketones, ur NEGATIVE  NEGATIVE mg/dL   Protein, ur NEGATIVE  NEGATIVE mg/dL   Urobilinogen, UA 0.2  0.0 - 1.0 mg/dL   Nitrite NEGATIVE  NEGATIVE   Leukocytes, UA SMALL (*) NEGATIVE  URINE MICROSCOPIC-ADD ON     Status: Abnormal   Collection Time    02/09/13 11:37 AM      Result Value Range   Squamous Epithelial / LPF RARE  RARE   WBC, UA 3-6  <3 WBC/hpf   RBC / HPF 0-2  <3 RBC/hpf   Bacteria, UA RARE  RARE   Casts  HYALINE CASTS (*) NEGATIVE    Radiological Exams on Admission: Dg Chest 2 View  02/09/2013   CLINICAL DATA:  72 year old female shortness of breath and anxiety.  EXAM: CHEST  2 VIEW  COMPARISON:  01/08/2013 and earlier.  FINDINGS: Semi upright AP and lateral views of the chest. Mildly lower lung volumes. Similar elevation of the right hemidiaphragm. The lungs are clear. No pneumothorax. Stable cardiomegaly and mediastinal contours. Visualized tracheal air column is within normal limits. Left cervical  spine stimulator type device re- identified. Calcified atherosclerosis of the aorta. No acute osseous abnormality identified. Right upper quadrant surgical clips.  IMPRESSION: Lower lung volumes, otherwise no acute cardiopulmonary abnormality.   Electronically Signed   By: Augusto Gamble M.D.   On: 02/09/2013 10:33    Assessment/Plan Principal Problem:   Weakness Active Problems:   Hypokalemia   Hyponatremia   SOB (shortness of breath)   HTN (hypertension)   Other and unspecified hyperlipidemia   COPD exacerbation   Generalized Weakness -Presumed related to electrolyte abnormalities. -PT/OT evals.  Hypokalemia -Replete PO. -Check Mag  Hyponatremia -Suspect related to diuretic regimen (lasix and zaroxolyn). -Stop diuretics and provide gentle hydration. -Recheck Na levels in am (124 on admission).  SOB -no wheezing on exam. -CXR negative. -Was recently treated for acute bronchitis with ceftin and prednisone. -She has a h/o COPD, but do not believe she has an acute exacerbation at the moment, so will not treat as such. -She states her respiratory status is currently at her baseline.  HTN -Continue home meds.  DVT Prophylaxis -Lovenox.  Code Status -Full code.  Time Spent on Admission: 75 minutes  HERNANDEZ ACOSTA,ESTELA [facility] Pager: [phone] 02/09/2013, 1:40 PM

## 2013-02-09 NOTE — ED Notes (Signed)
Pt reports her roommate has been keeping her up and has not had much sleep. Pt reports appetite has been poor.

## 2013-02-09 NOTE — ED Notes (Signed)
Pt from assisted living [facility]. Pt dx with bronchitis not completed dose pak. Pt reports wheezing and some nausea. Pt states her ribs hurt when she coughs and has burning in her chest when she coughs.

## 2013-02-09 NOTE — ED Notes (Signed)
Bed: NW29 Expected date:  Expected time:  Means of arrival:  Comments: ems-weakness

## 2013-02-09 NOTE — ED Provider Notes (Signed)
CSN: [id number]     Arrival date & time 02/09/13  0857 History   First MD Initiated Contact with Patient 02/09/13 [phone]     Chief Complaint  Patient presents with  . Shortness of Breath    wheezing cough   (Consider location/radiation/quality/duration/timing/severity/associated sxs/prior Treatment) HPI Comments: Patient with history of COPD presents from her assisted living facility with multiple complaints. She complains of worsening shortness of breath over the past 3 days with associated wheezing. Patient was recently treated for bronchitis and states it feels like it's coming back. She has had some nausea but no persistent vomiting. She complains of chest pain but only when she is coughing. She also complains of reflux type burning in her throat. This is been keeping her awake at night. No abdominal pain, diarrhea, urinary symptoms. Onset of symptoms gradual, course constant, nothing makes symptoms better or worse.   Patient is a 72 y.o. female presenting with shortness of breath. The history is provided by the patient.  Shortness of Breath Associated symptoms: chest pain and wheezing   Associated symptoms: no abdominal pain, no cough, no fever, no headaches, no rash, no sore throat and no vomiting     Past Medical History  Diagnosis Date  . Hypertension   . GERD (gastroesophageal reflux disease)   . Dementia   . IBS (irritable bowel syndrome)   . Arthritis   . Constipation   . Hiatal hernia   . Bipolar 1 disorder   . Diabetes mellitus   . Bronchitis   . Chronic back pain    No past surgical history on file. No family history on file. History  Substance Use Topics  . Smoking status: Never Smoker   . Smokeless tobacco: Not on file  . Alcohol Use: No   OB History   Grav Para Term Preterm Abortions TAB SAB Ect Mult Living                 Review of Systems  Constitutional: Negative for fever.  HENT: Negative for sore throat and rhinorrhea.   Eyes: Negative for redness.   Respiratory: Positive for shortness of breath and wheezing. Negative for cough.   Cardiovascular: Positive for chest pain.  Gastrointestinal: Positive for nausea. Negative for vomiting, abdominal pain and diarrhea.  Genitourinary: Negative for dysuria.  Musculoskeletal: Negative for myalgias.  Skin: Negative for rash.  Neurological: Negative for headaches.    Allergies  Aspirin; Codeine; Ivp dye; Morphine and related; Neomycin; Penicillins; Promethazine; Sulfa antibiotics; and Tetracyclines & related  Home Medications   Current Outpatient Rx  Name  Route  Sig  Dispense  Refill  . acetaminophen (TYLENOL) 500 MG tablet   Oral   Take 500 mg by mouth every 4 (four) hours as needed for pain or fever.         Marland Kitchen albuterol (PROVENTIL) (5 MG/ML) 0.5% nebulizer solution   Nebulization   Take 0.5 mL (2.5 mg total) by nebulization every 4 (four) hours as needed for wheezing or shortness of breath.   20 mL   12   . albuterol (PROVENTIL,VENTOLIN) 90 MCG/ACT inhaler   Inhalation   Inhale 2 puffs into the lungs every 4 (four) hours as needed. Wheezing and shortness of breath          . alum & mag hydroxide-simeth (MAALOX/MYLANTA) 200-200-20 MG/5ML suspension   Oral   Take 30 mLs by mouth every 6 (six) hours as needed for indigestion.         Marland Kitchen  antiseptic oral rinse (BIOTENE) LIQD   Mouth Rinse   15 mLs by Mouth Rinse route at bedtime.         Marland Kitchen atorvastatin (LIPITOR) 40 MG tablet   Oral   Take 40 mg by mouth every morning.          . budesonide-formoterol (SYMBICORT) 160-4.5 MCG/ACT inhaler   Inhalation   Inhale 2 puffs into the lungs 2 (two) times daily.         . carvedilol (COREG) 12.5 MG tablet   Oral   Take 12.5 mg by mouth 2 (two) times daily.         . Cholecalciferol (VITAMIN D) 1000 UNITS capsule   Oral   Take 1,000 Units by mouth every morning.          . clopidogrel (PLAVIX) 75 MG tablet   Oral   Take 75 mg by mouth every morning.          .  docusate sodium (COLACE) 100 MG capsule   Oral   Take 100 mg by mouth 2 (two) times daily.         Marland Kitchen donepezil (ARICEPT) 10 MG tablet   Oral   Take 10 mg by mouth every evening.          . fluticasone (FLONASE) 50 MCG/ACT nasal spray   Nasal   Place 2 sprays into the nose every morning.          . furosemide (LASIX) 40 MG tablet   Oral   Take 40 mg by mouth every morning.          Marland Kitchen guaiFENesin (ROBITUSSIN) 100 MG/5ML liquid   Oral   Take 5-10 mL (100-200 mg total) by mouth every 4 (four) hours as needed for cough.   60 mL   0   . lamoTRIgine (LAMICTAL) 200 MG tablet   Oral   Take 200 mg by mouth 2 (two) times daily.         Marland Kitchen lisinopril (PRINIVIL,ZESTRIL) 40 MG tablet   Oral   Take 40 mg by mouth every morning.          . loperamide (IMODIUM A-D) 2 MG tablet   Oral   Take 2 mg by mouth as needed for diarrhea or loose stools.         Marland Kitchen loratadine (CLARITIN) 10 MG tablet   Oral   Take 10 mg by mouth every morning.          Marland Kitchen LORazepam (ATIVAN) 1 MG tablet   Oral   Take 1 mg by mouth 2 (two) times daily.          . Magnesium Hydroxide (MILK OF MAGNESIA PO)   Oral   Take 30 mLs by mouth at bedtime as needed (as needed for constipation).         . magnesium oxide (MAG-OX) 400 MG tablet   Oral   Take 400 mg by mouth daily.         . pantoprazole (PROTONIX) 40 MG tablet   Oral   Take 40 mg by mouth every morning.          . polyethylene glycol (MIRALAX / GLYCOLAX) packet   Oral   Take 17 g by mouth every morning.          . predniSONE (DELTASONE) 20 MG tablet   Oral   Take 2 tablets (40 mg total) by mouth daily. Take 40 mg by mouth daily for 3  days, then 20mg  by mouth daily for 3 days, then 10mg  daily for 3 days   12 tablet   0   . QUEtiapine (SEROQUEL) 100 MG tablet   Oral   Take 100 mg by mouth at bedtime.          . verapamil (CALAN-SR) 120 MG CR tablet   Oral   Take 120 mg by mouth every morning.         . vitamin  B-12 (CYANOCOBALAMIN) 1000 MCG tablet   Oral   Take 1,000 mcg by mouth every morning.           BP 144/89  Pulse 60  Temp(Src) 98.6 F (37 C) (Oral)  Resp 18  SpO2 98% Physical Exam  Nursing note and vitals reviewed. Constitutional: She appears well-developed and well-nourished.  HENT:  Head: Normocephalic and atraumatic.  Eyes: Conjunctivae are normal. Right eye exhibits no discharge. Left eye exhibits no discharge.  Neck: Normal range of motion. Neck supple.  Cardiovascular: Normal rate, regular rhythm and normal heart sounds.   No murmur heard. Pulmonary/Chest: Effort normal. She has wheezes (mild, scattered). She has no rales.  Abdominal: Soft. There is no tenderness.  Neurological: She is alert.  Skin: Skin is warm and dry.  Psychiatric:  tearful    ED Course  Procedures (including critical care time) Labs Review Labs Reviewed  CBC WITH DIFFERENTIAL - Abnormal; Notable for the following:    WBC 11.3 (*)    Monocytes Absolute 1.4 (*)    All other components within normal limits  BASIC METABOLIC PANEL - Abnormal; Notable for the following:    Sodium 124 (*)    Potassium 3.1 (*)    Chloride 83 (*)    Glucose, Bld 115 (*)    BUN 26 (*)    GFR calc non Af Amer 57 (*)    GFR calc Af Amer 66 (*)    All other components within normal limits  URINALYSIS, ROUTINE W REFLEX MICROSCOPIC   Imaging Review No results found.  9:24 AM Patient seen and examined. Work-up initiated. Medications ordered.   Vital signs reviewed and are as follows: Filed Vitals:   02/09/13 0908  BP: 144/89  Pulse: 60  Temp: 98.6 F (37 C)  Resp: 18    Date: 02/09/2013  Rate: 57  Rhythm: sinus bradycardia  QRS Axis: normal  Intervals: normal  ST/T Wave abnormalities: normal  Conduction Disutrbances:none  Narrative Interpretation:   Old EKG Reviewed: unchanged from 01/31/11  Patient discussed with and seen by Dr. Jeraldine Loots. Discussed giving fluids in ED, rechecking electrolytes and  discharge vs admission for correction of electrolytes.   Given patient's current living situation, feel she would be better suited for obs admission. I have spoken with Dr. Ardyth Harps of [facility] who agrees to see the patient.    MDM   1. Hyponatremia   2. Hypokalemia   3. Bronchitis    Electrolyte imbalance: new over past month, likely due to poor PO intake, increased free water intake with contribution from medications. Hydration needed.    Bronchitis: Neb and prednisone given in ED.      Renne Crigler, PA-C 02/09/13 1156

## 2013-02-09 NOTE — ED Notes (Signed)
Pt reports burning in her chest states her protonix is not working. Pt has been treated for Bronchitis since the past month. Pt has multiple complaints. Pt complains of acid reflux, weakness, blurry vision from insomnia. Nausea, vomiting one time yesterday. Pt complain of cough, wheezing, post nasal drip.

## 2013-02-10 DIAGNOSIS — J441 Chronic obstructive pulmonary disease with (acute) exacerbation: Secondary | ICD-10-CM

## 2013-02-10 LAB — CBC
MCH: 29.3 pg (ref 26.0–34.0)
MCHC: 36.1 g/dL — ABNORMAL HIGH (ref 30.0–36.0)
Platelets: 283 10*3/uL (ref 150–400)
RDW: 15 % (ref 11.5–15.5)

## 2013-02-10 LAB — BASIC METABOLIC PANEL
BUN: 19 mg/dL (ref 6–23)
Calcium: 9.5 mg/dL (ref 8.4–10.5)
GFR calc non Af Amer: 62 mL/min — ABNORMAL LOW (ref >90)
Glucose, Bld: 137 mg/dL — ABNORMAL HIGH (ref 70–99)
Sodium: 123 mEq/L — ABNORMAL LOW (ref 135–145)

## 2013-02-10 LAB — MRSA PCR SCREENING: MRSA by PCR: NEGATIVE

## 2013-02-10 MED ORDER — LORAZEPAM 2 MG/ML IJ SOLN
0.5000 mg | Freq: Once | INTRAMUSCULAR | Status: AC
  Administered 2013-02-10: 0.5 mg via INTRAVENOUS

## 2013-02-10 MED ORDER — LORAZEPAM 2 MG/ML IJ SOLN
INTRAMUSCULAR | Status: AC
  Filled 2013-02-10: qty 1

## 2013-02-10 NOTE — Progress Notes (Signed)
Clinical Social Work Department BRIEF PSYCHOSOCIAL ASSESSMENT 02/10/2013  Patient:  Cindy Padilla, Cindy Padilla     Account Number:  [id number]     Admit date:  02/09/2013  Clinical Social Worker:  Jacelyn Grip  Date/Time:  02/10/2013 10:15 AM  Referred by:  Physician  Date Referred:  02/10/2013 Referred for  ALF Placement   Other Referral:   Interview type:  Patient Other interview type:    PSYCHOSOCIAL DATA Living Status:  FACILITY Admitted from facility:  [facility] Level of care:  Assisted Living Primary support name:  Darl Pikes Maines/daughter/[phone] Primary support relationship to patient:  CHILD, ADULT Degree of support available:   adequate    CURRENT CONCERNS Current Concerns  Post-Acute Placement   Other Concerns:    SOCIAL WORK ASSESSMENT / PLAN CSW received referral that pt admitted from [facility].    CSW met with pt at bedside to discuss. Pt confirmed that she is a resident of [address] [facility]. Pt plans to return there when medically stable. Pt reports that she has been having difficulty with a roommate and stated that pt daughter planned to discuss with facility. Pt provided permission for CSW to contact pt daughter.    CSW contacted pt daughter and left voice message.    CSW contacted [facility] who confirmed pt could return when medically stable. Per faciility, if pt requires HH needs the facility will just need HH orders in order to incorporate [facility] PT/OT at ALF. Per facility, facility is aware of pt concerns about roommate and are having discussion with pt daughter in order to come to a resolution.    CSW to continue to follow and facilitate pt discharge needs when pt medically ready for discharge.   Assessment/plan status:  Psychosocial Support/Ongoing Assessment of Needs Other assessment/ plan:   discharge planning   Information/referral to community resources:   Referral back to [facility].     PATIENT'S/FAMILY'S RESPONSE TO PLAN OF CARE: Pt alert and oriented to person and place. Pt reports difficulty sleeping and CSW encouraged pt to discuss with MD. Pt agreeable to returning to [facility] as she has a resident there for a year.    Jacklynn Lewis, MSW, LCSWA  Clinical Social Work [phone]

## 2013-02-10 NOTE — Progress Notes (Addendum)
[facility] PROGRESS NOTE  Cindy Padilla WUJ:[id number] DOB: 05/19/1941 DOA: 02/09/2013 PCP: Eloisa Northern, MD  Brief narrative: 72 year old female with multiple medical comorbidities including but not limited to dyslipidemia, dementia, hypertension and COPD who presented from assisted living facility to  [facility] ED 02/09/2013 with worsening shortness of breath, weakness and fatigue. In ED patient was found to have a sodium level of 124, potassium 3.1. Her white blood cell count was 11.3. Chest x-ray did not reveal acute cardiopulmonary findings.  Assessment/Plan:  Principal Problem:   Weakness, fatigue - Possibly due to worsening dementia - Obtain physical therapy evaluation for safe discharge plan Active Problems:   Hyponatremia - Likely secondary to dehydration - Check TSH, urine sodium and urine osmolarity - Continue to monitor sodium level - Continue IV fluids   Hypokalemia - Repleted, followup potassium level in the morning   SOB (shortness of breath) - Likely related to history of COPD - No hypoxia but patient does have mild wheezing on physical exam - Continue Symbicort inhaler twice daily   HTN (hypertension) - Continue verapamil 120 mg daily, lisinopril 40 mg daily and Correctol 0.5 mg twice a day - Renal l function within normal limits   Hyperlipidemia - Continue statin therapy   Dementia - Stable, continue Aricept   Anxiety and depression - Continue Ativan 1 mg every 12 hours by mouth, Seroquel 100 mg at bedtime  Code Status: full code Family Communication: no family at the bedside Disposition Plan: home when stable   Manson Passey, MD  [facility] Pager [phone]  If 7PM-7AM, please contact night-coverage [email or web address] Password [email or web address] 02/10/2013, 11:46 AM   LOS: 1 day   Consultants:  Physical therapy   Procedures:  None   Antibiotics:  None   HPI/Subjective: No overnight events.  Objective: Filed Vitals:   02/09/13 2101 02/09/13  2139 02/10/13 0500 02/10/13 1058  BP:  165/74 151/78   Pulse:  60 61   Temp:  97.5 F (36.4 C) 98.4 F (36.9 C)   TempSrc:  Oral Oral   Resp:  18 16   Height:      Weight:      SpO2: 97% 95% 97% 98%    Intake/Output Summary (Last 24 hours) at 02/10/13 1146 Last data filed at 02/10/13 1129  Gross per 24 hour  Intake   1700 ml  Output   1950 ml  Net   -250 ml    Exam:   General:  Pt is alert, follows commands appropriately, not in acute distress  Cardiovascular: Regular rate and rhythm, S1/S2, no murmurs, no rubs, no gallops  Respiratory: Wheezing appreciated in upper lung lobes, no rhonchi or crackles  Abdomen: Soft, non tender, non distended, bowel sounds present, no guarding  Extremities: No edema, pulses DP and PT palpable bilaterally  Neuro: Grossly nonfocal  Data Reviewed: Basic Metabolic Panel:  Recent Labs Lab 02/09/13 0950 02/09/13 1354 02/10/13 0343  NA 124*  --  123*  K 3.1*  --  4.4  CL 83*  --  86*  CO2 26  --  25  GLUCOSE 115*  --  137*  BUN 26*  --  19  CREATININE 0.97  --  0.90  CALCIUM 9.7  --  9.5  MG  --  2.1  --    Liver Function Tests: No results found for this basename: AST, ALT, ALKPHOS, BILITOT, PROT, ALBUMIN,  in the last 168 hours No results found for this basename: LIPASE, AMYLASE,  in the last  168 hours No results found for this basename: AMMONIA,  in the last 168 hours CBC:  Recent Labs Lab 02/09/13 0950 02/10/13 0343  WBC 11.3* 10.0  NEUTROABS 6.0  --   HGB 13.5 12.1  HCT 38.4 33.5*  MCV 81.2 81.1  PLT 306 283   Cardiac Enzymes: No results found for this basename: CKTOTAL, CKMB, CKMBINDEX, TROPONINI,  in the last 168 hours BNP: No components found with this basename: POCBNP,  CBG: No results found for this basename: GLUCAP,  in the last 168 hours  MRSA PCR SCREENING     Status: None   Collection Time    02/09/13  9:32 PM      Result Value Range Status   MRSA by PCR NEGATIVE  NEGATIVE Final      Studies: Dg Chest 2 View 02/09/2013     IMPRESSION: Lower lung volumes, otherwise no acute cardiopulmonary abnormality.   Electronically Signed   By: Augusto Gamble M.D.   On: 02/09/2013 10:33    Scheduled Meds: . atorvastatin  40 mg Oral q morning - 10a  . budesonide-formoterol  2 puff Inhalation BID  . carvedilol  12.5 mg Oral BID WC  . clopidogrel  75 mg Oral q morning - 10a  . docusate sodium  100 mg Oral BID  . donepezil  10 mg Oral QHS  . enoxaparin (LOVENOX)   40 mg Subcutaneous Q24H  . fluticasone  2 spray Each Nare q morning - 10a  . lamoTRIgine  200 mg Oral BID  . lisinopril  40 mg Oral q morning - 10a  . loratadine  10 mg Oral q morning - 10a  . LORazepam  1 mg Oral Q12H  . pantoprazole  40 mg Oral q morning - 10a  . polyethylene glycol  17 g Oral q morning - 10a  . QUEtiapine  100 mg Oral QHS  . verapamil  120 mg Oral q morning - 10a  . vitamin B-12  1,000 mcg Oral q morning - 10a   Continuous Infusions: . sodium chloride 75 mL/hr at 02/10/13 [phone]

## 2013-02-10 NOTE — Evaluation (Signed)
Physical Therapy Evaluation Patient Details Name: ARBOR LEER MRN: [id number] DOB: [birth year] Today's Date: 02/10/2013 Time: 1030-1056 PT Time Calculation (min): 26 min  PT Assessment / Plan / Recommendation History of Present Illness  Pleasant 72 y/o woman with multiple medical comorbities most significant for : HTN, HLD, COPD. Who was sent from her ALF with complaints of mild SOB and extreme fatigue/weakness. She states this started all of a sudden today. In the ED she was found to have a Na of 124, K of 3.1. CXR negative  Clinical Impression  Pt appears to be deconditioned and has impulsiveness with decreased safety.    PT Assessment  Patient needs continued PT services    Follow Up Recommendations  Home health PT at ALF    Does the patient have the potential to tolerate intense rehabilitation      Barriers to Discharge        Equipment Recommendations       Recommendations for Other Services     Frequency Min 3X/week    Precautions / Restrictions Precautions Precautions: Fall Restrictions Weight Bearing Restrictions: No   Pertinent Vitals/Pain C/o "heartburn" pain,  RN aware      Mobility  Bed Mobility Bed Mobility: Supine to Sit;Sit to Supine Supine to Sit: With rails;HOB flat;5: Supervision Sit to Supine: 5: Supervision Details for Bed Mobility Assistance: pt tends to move impulsively Transfers Transfers: Sit to Stand;Stand to Sit Sit to Stand: From bed;With upper extremity assist;With armrests;5: Supervision;From chair/3-in-1 Stand to Sit: To chair/3-in-1;With upper extremity assist;With armrests;5: Supervision;To bed Ambulation/Gait Ambulation/Gait Assistance: 5: Supervision Ambulation Distance (Feet): 50 Feet Assistive device: Rolling walker Ambulation/Gait Assistance Details: pt is comfortable with RW and states that she has one at home just like it Gait velocity: impulsive General Gait Details: pt needs encouragement to walk- she states she  needs to sleep- but once she is up she able to do fairly well with RW. Stairs: No Wheelchair Mobility Wheelchair Mobility: No    Exercises     PT Diagnosis: Difficulty walking;Abnormality of gait;Generalized weakness  PT Problem List: Decreased safety awareness;Decreased mobility;Decreased activity tolerance PT Treatment Interventions: Gait training;Functional mobility training;Therapeutic activities;Therapeutic exercise;Balance training     PT Goals(Current goals can be found in the care plan section) Acute Rehab PT Goals Patient Stated Goal: to get some sleep Time For Goal Achievement: 02/24/13 Potential to Achieve Goals: Good  Visit Information  Last PT Received On: 02/10/13 Assistance Needed: +1 History of Present Illness: Pleasant 72 y/o woman with multiple medical comorbities most significant for : HTN, HLD, COPD. Who was sent from her ALF with complaints of mild SOB and extreme fatigue/weakness. She states this started all of a sudden today. In the ED she was found to have a Na of 124, K of 3.1. CXR negative       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, impulsive, cues to attend to task)    Extremity/Trunk Assessment Upper Extremity Assessment Upper Extremity Assessment: Generalized weakness Lower Extremity Assessment Lower Extremity Assessment: Generalized weakness Cervical / Trunk Assessment Cervical / Trunk Assessment: Other exceptions Cervical / Trunk  Exceptions: obese.  Pt c/o "heartburn" despite meds and needs to keep [facility] elevated   Balance Balance Balance Assessed: Yes Static Sitting Balance Static Sitting - Balance Support: No upper extremity supported;Feet supported Static Sitting - Level of Assistance: 7: Independent Static Standing Balance Static Standing - Balance Support: Bilateral upper extremity supported Static Standing - Level of Assistance: 5: Stand by assistance Static Standing - Comment/# of Minutes: using RW  End of Session PT - End of Session Activity Tolerance: Patient limited by fatigue Patient left: in bed Nurse Communication: Mobility status  GP    Rosey Bath K. Manson Passey, [place] [phone] 02/10/2013, 11:05 AM

## 2013-02-10 NOTE — ED Provider Notes (Signed)
Medical screening examination/treatment/procedure(s) were performed by non-physician practitioner and as supervising physician I was immediately available for consultation/collaboration.   Qusay Villada, MD 02/10/13 2245 

## 2013-02-10 NOTE — Evaluation (Signed)
Occupational Therapy Evaluation Patient Details Name: Cindy Padilla MRN: [id number] DOB: [birth year] Today's Date: 02/10/2013 Time: 6578-4696 OT Time Calculation (min): 24 min  OT Assessment / Plan / Recommendation History of present illness Pleasant 72 y/o woman with multiple medical comorbities most significant for : HTN, HLD, COPD. Who was sent from her ALF with complaints of mild SOB and extreme fatigue/weakness. She states this started all of a sudden today. In the ED she was found to have a Na of 124, K of 3.1. CXR negative. Pt also with a psych history.     Clinical Impression   Pt presents to OT with decreased I with ADL activity due to pain and generalized weakness. Pt will benefit from skilled OT to increase I with ADL activity to return to PLOF    OT Assessment  Patient needs continued OT Services    Follow Up Recommendations  Home health OT       Equipment Recommendations  None recommended by OT       Frequency  Min 2X/week    Precautions / Restrictions Precautions Precautions: Fall Restrictions Weight Bearing Restrictions: No       ADL  Grooming: Performed;Wash/dry face;Wash/dry hands;Set up Where Assessed - Grooming: Unsupported sitting Upper Body Dressing: Simulated;Minimal assistance Where Assessed - Upper Body Dressing: Unsupported sit to stand Lower Body Dressing: Performed;Moderate assistance Where Assessed - Lower Body Dressing: Unsupported sit to stand Toilet Transfer: Performed;Min guard Toilet Transfer Method: Sit to Barista: Comfort height toilet Toileting - Clothing Manipulation and Hygiene: Performed;Minimal assistance Where Assessed - Toileting Clothing Manipulation and Hygiene: Standing Transfers/Ambulation Related to ADLs: Pt has A at ALF with ADL activity.  Pt reports she is having a hard time sleeping at ALF due to roommate screaming all the time    OT Diagnosis: Generalized weakness;Acute pain  OT Problem  List: Decreased strength;Decreased safety awareness;Impaired balance (sitting and/or standing);Decreased activity tolerance;Pain OT Treatment Interventions: Self-care/ADL training;Patient/family education;DME and/or AE instruction   OT Goals(Current goals can be found in the care plan section) Acute Rehab OT Goals Patient Stated Goal: get a new roommate at [facility] so i can rest OT Goal Formulation: With patient Time For Goal Achievement: 02/24/13 Potential to Achieve Goals: Good  Visit Information  Last OT Received On: 02/10/13 Assistance Needed: +1 History of Present Illness: Pleasant 72 y/o woman with multiple medical comorbities most significant for : HTN, HLD, COPD. Who was sent from her ALF with complaints of mild SOB and extreme fatigue/weakness. She states this started all of a sudden today. In the ED she was found to have a Na of 124, K of 3.1. CXR negative       Prior Functioning     Home Living Family/patient expects to be discharged to:: Assisted living Home Equipment: Walker - 2 wheels Prior Function Level of Independence: Needs assistance Gait / Transfers Assistance Needed: modified ind with walker ADL's / Homemaking Assistance Needed: assist with showers Communication Communication: No difficulties         Vision/Perception Vision - History Visual History: Cataracts Patient Visual Report: No change from baseline Vision - Assessment Eye Alignment: Within Functional Limits   Cognition  Cognition Arousal/Alertness: Awake/alert Behavior During Therapy: WFL for tasks assessed/performed Overall Cognitive Status: History of cognitive impairments - at baseline  Pt very talkative and needs VC for redirection at times.   Extremity/Trunk Assessment Upper Extremity Assessment Upper Extremity Assessment: Generalized weakness     Mobility Bed Mobility Bed Mobility: Supine to Sit  Supine to Sit: 4: Min assist;With rails;HOB flat Transfers Transfers: Sit to  Stand;Stand to Sit Sit to Stand: 4: Min assist;From bed;With upper extremity assist;With armrests Stand to Sit: To chair/3-in-1;4: Min assist;With upper extremity assist;With armrests           End of Session OT - End of Session Activity Tolerance: Patient tolerated treatment well Patient left: in chair Nurse Communication: Mobility status  GO     [name] 02/10/2013, 9:00 AM

## 2013-02-11 LAB — BASIC METABOLIC PANEL
BUN: 21 mg/dL (ref 6–23)
CO2: 23 mEq/L (ref 19–32)
Calcium: 8.8 mg/dL (ref 8.4–10.5)
Creatinine, Ser: 0.82 mg/dL (ref 0.50–1.10)
Glucose, Bld: 117 mg/dL — ABNORMAL HIGH (ref 70–99)

## 2013-02-11 MED ORDER — QUETIAPINE FUMARATE 100 MG PO TABS
100.0000 mg | ORAL_TABLET | Freq: Every day | ORAL | Status: DC
  Administered 2013-02-11: 100 mg via ORAL
  Filled 2013-02-11: qty 1

## 2013-02-11 MED ORDER — QUETIAPINE FUMARATE 100 MG PO TABS: 100.0000 mg | ORAL_TABLET | Freq: Every day | ORAL | Status: DC

## 2013-02-11 MED ORDER — LORAZEPAM 1 MG PO TABS: 1.0000 mg | ORAL_TABLET | Freq: Two times a day (BID) | ORAL | Status: DC

## 2013-02-11 NOTE — Progress Notes (Signed)
Pt for discharge back to [facility] AL[facility].  CSW facilitated pt discharge needs including contacting facility, faxing pt discharge information via [facility], discussing with pt and pt daughter via telephone, providing RN phone number to call report, confirming portable O2 had been delivered to pt room and O2 concentrator was being delivered to pt facility, pt daughter plans to transport via private vehicle. Discharge packet provided at bedside for pt and pt daughter to provide to [facility] upon return.  No further social work needs identified at this time.  CSW signing off.   Jacklynn Lewis, MSW, LCSWA  Clinical Social Work [phone]

## 2013-02-11 NOTE — Progress Notes (Signed)
Pt O2 sat on RA while resting was 95%. While ambulating on RA pt's O2 sats dropped to 85%. While ambulating on 2 L O2, pt O2 sats were 95%.

## 2013-02-11 NOTE — Care Management Note (Signed)
MD order for HHRN/PT/OT/Aide.Cm notified [facility] liasion Venia Minks concerning referral. Pt requires home oxygen therapy. [facility] to provide home DME. Md orders, demographics, and oxygen saturation note faxed to [facility] at [phone].    Roxy Manns Talor Cheema,RN,MSN [phone]

## 2013-02-11 NOTE — Progress Notes (Signed)
Report given to Armed forces technical officer at [facility]. PIV removed, WNL. No change in pt condition since AM assessment. Pt d/c'd to ALF via pt's daughter. Eugene Garnet RN

## 2013-02-11 NOTE — Discharge Summary (Signed)
Physician Discharge Summary  Cindy Padilla ZDG:[id number] DOB: December 27, 1940 DOA: 02/09/2013  PCP: Eloisa Northern, MD  Admit date: 02/09/2013 Discharge date: 02/11/2013  Recommendations for Outpatient Follow-up:  1. Patient was hospitalized for hyponatremia, sodium level of 123. The repeat level was 126. This hyponatremia could be due to lasix (diuretic) so please stop Lasix for another 7 days or until sodium gets back to the normal level 135 to 145 range (whichever comes first). Recheck sodium at least every other day to make sure it is trending up. 2. Patient was on IV fluids which likely led to sodium improvement. IV fluids now discontinued as patient tolerates PO intake. 3.  Patient requires continuous oxygen via nasal cannula, 2 L to keep oxygen saturation above 90% 4. Patient is on Ativan 1 mg twice daily. This medication can cause confusion in elderly patients. If you notice change in mental status please consider stopping this medication. 5. Patient can continue taking donepezil 10 mg at bedtime 6. Continue Lipitor 40 mg at bedtime 7. Continue taking blood pressure medications as before, Coreg, Lisinopril. 8. Kidney function is within normal limits during this hospital stay  Discharge Diagnoses:  Principal Problem:   Weakness Active Problems:   Hypokalemia   Hyponatremia   SOB (shortness of breath)   HTN (hypertension)   Other and unspecified hyperlipidemia   COPD exacerbation    Discharge Condition: Patient is medically stable for discharge to assisted living facility. Home health physical therapy, occupational therapy and nurse order placed; order placed for home oxygen, 2 L via nasal cannula  Diet recommendation: Diet as tolerated.  History of present illness:  72 year old female with multiple medical comorbidities including but not limited to dyslipidemia, dementia, hypertension and COPD who presented from assisted living facility to [facility] ED 02/09/2013 with worsening  shortness of breath, weakness and fatigue. In ED patient was found to have a sodium level of 124, potassium 3.1. Her white blood cell count was 11.3. Chest x-ray did not reveal acute cardiopulmonary findings.   Assessment/Plan:   Principal Problem:  Weakness, fatigue  - Possibly due to worsening dementia  - HHPT/OT/RN  in place  Active Problems:  Hyponatremia  - Likely secondary to dehydration or diuretic lasix - hold lasix for another 7 days or until sodium within normal limit - sodium imp[roving; please obtain sodium level as mentioned above at least every other day to make sure it is trending up Hypokalemia  - Repleted, followup potassium WNL SOB (shortness of breath)  - due to h/o COPD; now stable - needs 2 L home oxygen - Continue Symbicort inhaler twice daily  HTN (hypertension)  - Continue verapamil 120 mg daily, lisinopril 40 mg daily - Renal l function within normal limits  Hyperlipidemia  - Continue statin therapy  Dementia  - Stable, continue Aricept  Anxiety and depression  - Continue Ativan 1 mg every 12 hours by mouth, Seroquel 100 mg at bedtime   Code Status: full code  Family Communication: no family at the bedside   Manson Passey, MD  [facility]  Pager [phone]   Consultants:  Physical therapy  Procedures:  None  Antibiotics:  None   Signed:  Manson Passey, MD  [facility] 02/11/2013, 12:08 PM  Pager #: [phone]   Discharge Exam: Filed Vitals:   02/11/13 0610  BP: 132/59  Pulse: 52  Temp: 97.5 F (36.4 C)  Resp: 15   Filed Vitals:   02/10/13 1322 02/10/13 1938 02/10/13 2049 02/11/13 0610  BP: 144/56  131/49 132/59  Pulse: 55  50 52  Temp: 98.1 F (36.7 C)  97.4 F (36.3 C) 97.5 F (36.4 C)  TempSrc: Oral  Oral Oral  Resp: 18  18 15   Height:      Weight:      SpO2: 100% 98% 96% 100%    General: Pt is alert,  not in acute distress Cardiovascular: Regular rate and rhythm, S1/S2 +, no murmurs, no rubs, no  gallops Respiratory: Clear to auscultation bilaterally, no wheezing, no crackles, no rhonchi Abdominal: Soft, non tender, non distended, bowel sounds +, no guarding Extremities: no edema, no cyanosis, pulses palpable bilaterally DP and PT Neuro: Grossly nonfocal  Discharge Instructions  Discharge Orders   Future Orders Complete By Expires   Call MD for:  difficulty breathing, headache or visual disturbances  As directed    Call MD for:  persistant dizziness or light-headedness  As directed    Call MD for:  persistant nausea and vomiting  As directed    Call MD for:  severe uncontrolled pain  As directed    Diet - low sodium heart healthy  As directed    Discharge instructions  As directed    Comments:     1. Patient was hospitalized for hyponatremia, sodium level of 123. The repeat level was 126. This hyponatremia could be due to lasix (diuretic) so please stop Lasix for another 7 days or until sodium gets back to the normal level 135 to 145 range (whichever comes first). Recheck sodium at least every other day to make sure it is trending up. 2. Patient was on IV fluids which likely led to sodium improvement. IV fluids now discontinued as patient tolerates PO intake. 3.  Patient requires continuous oxygen via nasal cannula, 2 L to keep oxygen saturation above 90% 4. Patient is on Ativan 1 mg twice daily. This medication can cause confusion in elderly patients. If you notice change in mental status please consider stopping this medication. 5. Patient can continue taking donepezil 10 mg at bedtime 6. Continue Lipitor 40 mg at bedtime 7. Continue taking blood pressure medications as before, Coreg, Lisinopril. 8. Kidney function is within normal limits during this hospital stay   Increase activity slowly  As directed        Medication List    STOP taking these medications       furosemide 40 MG tablet  Commonly known as:  LASIX     metolazone 5 MG tablet  Commonly known as:   ZAROXOLYN     predniSONE 20 MG tablet  Commonly known as:  DELTASONE      TAKE these medications       acetaminophen 500 MG tablet  Commonly known as:  TYLENOL  Take 500 mg by mouth every 4 (four) hours as needed for pain or fever.     albuterol 90 MCG/ACT inhaler  Commonly known as:  PROVENTIL,VENTOLIN  Inhale 2 puffs into the lungs every 4 (four) hours as needed. Wheezing and shortness of breath     alum & mag hydroxide-simeth 200-200-20 MG/5ML suspension  Commonly known as:  MAALOX/MYLANTA  Take 30 mLs by mouth every 6 (six) hours as needed for indigestion.     antiseptic oral rinse Liqd  15 mLs by Mouth Rinse route at bedtime.     atorvastatin 40 MG tablet  Commonly known as:  LIPITOR  Take 40 mg by mouth every morning.     budesonide-formoterol 160-4.5 MCG/ACT inhaler  Commonly known  as:  SYMBICORT  Inhale 2 puffs into the lungs 2 (two) times daily.     carvedilol 12.5 MG tablet  Commonly known as:  COREG  Take 12.5 mg by mouth 2 (two) times daily.     cholecalciferol 1000 UNITS tablet  Commonly known as:  VITAMIN D  Take 1,000 Units by mouth daily.     clopidogrel 75 MG tablet  Commonly known as:  PLAVIX  Take 75 mg by mouth every morning.     docusate sodium 100 MG capsule  Commonly known as:  COLACE  Take 100 mg by mouth 2 (two) times daily.     donepezil 10 MG tablet  Commonly known as:  ARICEPT  Take 10 mg by mouth every evening.     fluticasone 50 MCG/ACT nasal spray  Commonly known as:  FLONASE  Place 2 sprays into the nose every morning.     guaiFENesin 100 MG/5ML liquid  Commonly known as:  ROBITUSSIN  Take 5-10 mL (100-200 mg total) by mouth every 4 (four) hours as needed for cough.     ipratropium-albuterol 0.5-2.5 (3) MG/3ML Soln  Commonly known as:  DUONEB  Take 3 mLs by nebulization See admin instructions. Take 1 neb four times daily for 5 days(stops 02/08/13) then take 1 neb twice daily     lamoTRIgine 200 MG tablet  Commonly known  as:  LAMICTAL  Take 200 mg by mouth 2 (two) times daily.     lisinopril 40 MG tablet  Commonly known as:  PRINIVIL,ZESTRIL  Take 40 mg by mouth every morning.     loperamide 2 MG tablet  Commonly known as:  IMODIUM A-D  Take 2 mg by mouth as needed for diarrhea or loose stools.     loratadine 10 MG tablet  Commonly known as:  CLARITIN  Take 10 mg by mouth every morning.     LORazepam 1 MG tablet  Commonly known as:  ATIVAN  Take 1 tablet (1 mg total) by mouth every 12 (twelve) hours.     magnesium oxide 400 MG tablet  Commonly known as:  MAG-OX  Take 400 mg by mouth daily.     MILK OF MAGNESIA PO  Take 30 mLs by mouth at bedtime as needed (as needed for constipation).     pantoprazole 40 MG tablet  Commonly known as:  PROTONIX  Take 40 mg by mouth every morning.     polyethylene glycol packet  Commonly known as:  MIRALAX / GLYCOLAX  Take 17 g by mouth every morning.     QUEtiapine 100 MG tablet  Commonly known as:  SEROQUEL  Take 1 tablet (100 mg total) by mouth at bedtime.     verapamil 120 MG CR tablet  Commonly known as:  CALAN-SR  Take 120 mg by mouth every morning.     vitamin B-12 1000 MCG tablet  Commonly known as:  CYANOCOBALAMIN  Take 1,000 mcg by mouth every morning.           Follow-up Information   Follow up with AMIN, SAAD, MD. Schedule an appointment as soon as possible for a visit in 1 week.   Specialty:  Internal Medicine   Contact information:   [address] [place] [address] [phone]        The results of significant diagnostics from this hospitalization (including imaging, microbiology, ancillary and laboratory) are listed below for reference.    Significant Diagnostic Studies: Dg Chest 2 View  02/09/2013  CLINICAL DATA:  72 year old female shortness of breath and anxiety.  EXAM: CHEST  2 VIEW  COMPARISON:  01/08/2013 and earlier.  FINDINGS: Semi upright AP and lateral views of the chest. Mildly lower lung volumes.  Similar elevation of the right hemidiaphragm. The lungs are clear. No pneumothorax. Stable cardiomegaly and mediastinal contours. Visualized tracheal air column is within normal limits. Left cervical spine stimulator type device re- identified. Calcified atherosclerosis of the aorta. No acute osseous abnormality identified. Right upper quadrant surgical clips.  IMPRESSION: Lower lung volumes, otherwise no acute cardiopulmonary abnormality.   Electronically Signed   By: Augusto Gamble M.D.   On: 02/09/2013 10:33    Microbiology: Recent Results (from the past 240 hour(s))  MRSA PCR SCREENING     Status: None   Collection Time    02/09/13  9:32 PM      Result Value Range Status   MRSA by PCR NEGATIVE  NEGATIVE Final   Comment:            The GeneXpert MRSA Assay (FDA     approved for NASAL specimens     only), is one component of a     comprehensive MRSA colonization     surveillance program. It is not     intended to diagnose MRSA     infection nor to guide or     monitor treatment for     MRSA infections.     Labs: Basic Metabolic Panel:  Recent Labs Lab 02/09/13 0950 02/09/13 1354 02/10/13 0343 02/11/13 0344  NA 124*  --  123* 126*  K 3.1*  --  4.4 3.7  CL 83*  --  86* 92*  CO2 26  --  25 23  GLUCOSE 115*  --  137* 117*  BUN 26*  --  19 21  CREATININE 0.97  --  0.90 0.82  CALCIUM 9.7  --  9.5 8.8  MG  --  2.1  --   --    Liver Function Tests: No results found for this basename: AST, ALT, ALKPHOS, BILITOT, PROT, ALBUMIN,  in the last 168 hours No results found for this basename: LIPASE, AMYLASE,  in the last 168 hours No results found for this basename: AMMONIA,  in the last 168 hours CBC:  Recent Labs Lab 02/09/13 0950 02/10/13 0343  WBC 11.3* 10.0  NEUTROABS 6.0  --   HGB 13.5 12.1  HCT 38.4 33.5*  MCV 81.2 81.1  PLT 306 283   Cardiac Enzymes: No results found for this basename: CKTOTAL, CKMB, CKMBINDEX, TROPONINI,  in the last 168 hours BNP: BNP (last 3  results)  Recent Labs  10/05/12 2220  PROBNP 384.5*   CBG: No results found for this basename: GLUCAP,  in the last 168 hours  Time coordinating discharge: Over 30 minutes

## 2013-02-21 ENCOUNTER — Emergency Department (HOSPITAL_COMMUNITY): Payer: Medicare Other

## 2013-02-21 ENCOUNTER — Inpatient Hospital Stay (HOSPITAL_COMMUNITY)
Admission: EM | Admit: 2013-02-21 | Discharge: 2013-02-24 | DRG: 291 | Disposition: A | Discharge status: Skilled Nursing Facility | Payer: Medicare Other | Attending: Internal Medicine | Admitting: Internal Medicine

## 2013-02-21 ENCOUNTER — Encounter (HOSPITAL_COMMUNITY): Payer: Self-pay | Admitting: Emergency Medicine

## 2013-02-21 DIAGNOSIS — R062 Wheezing: Secondary | ICD-10-CM

## 2013-02-21 DIAGNOSIS — E876 Hypokalemia: Secondary | ICD-10-CM

## 2013-02-21 DIAGNOSIS — R5381 Other malaise: Secondary | ICD-10-CM | POA: Diagnosis present

## 2013-02-21 DIAGNOSIS — G8929 Other chronic pain: Secondary | ICD-10-CM | POA: Diagnosis present

## 2013-02-21 DIAGNOSIS — F0390 Unspecified dementia without behavioral disturbance: Secondary | ICD-10-CM | POA: Diagnosis present

## 2013-02-21 DIAGNOSIS — J441 Chronic obstructive pulmonary disease with (acute) exacerbation: Secondary | ICD-10-CM

## 2013-02-21 DIAGNOSIS — E871 Hypo-osmolality and hyponatremia: Secondary | ICD-10-CM

## 2013-02-21 DIAGNOSIS — Z66 Do not resuscitate: Secondary | ICD-10-CM | POA: Diagnosis present

## 2013-02-21 DIAGNOSIS — M7989 Other specified soft tissue disorders: Secondary | ICD-10-CM

## 2013-02-21 DIAGNOSIS — M129 Arthropathy, unspecified: Secondary | ICD-10-CM | POA: Diagnosis present

## 2013-02-21 DIAGNOSIS — F319 Bipolar disorder, unspecified: Secondary | ICD-10-CM | POA: Diagnosis present

## 2013-02-21 DIAGNOSIS — I10 Essential (primary) hypertension: Secondary | ICD-10-CM

## 2013-02-21 DIAGNOSIS — E785 Hyperlipidemia, unspecified: Secondary | ICD-10-CM

## 2013-02-21 DIAGNOSIS — K589 Irritable bowel syndrome without diarrhea: Secondary | ICD-10-CM | POA: Diagnosis present

## 2013-02-21 DIAGNOSIS — R0602 Shortness of breath: Secondary | ICD-10-CM

## 2013-02-21 DIAGNOSIS — Z9981 Dependence on supplemental oxygen: Secondary | ICD-10-CM

## 2013-02-21 DIAGNOSIS — R609 Edema, unspecified: Secondary | ICD-10-CM | POA: Diagnosis present

## 2013-02-21 DIAGNOSIS — J9600 Acute respiratory failure, unspecified whether with hypoxia or hypercapnia: Secondary | ICD-10-CM

## 2013-02-21 DIAGNOSIS — R0601 Orthopnea: Secondary | ICD-10-CM | POA: Diagnosis present

## 2013-02-21 DIAGNOSIS — I4891 Unspecified atrial fibrillation: Secondary | ICD-10-CM | POA: Diagnosis present

## 2013-02-21 DIAGNOSIS — I509 Heart failure, unspecified: Principal | ICD-10-CM

## 2013-02-21 DIAGNOSIS — K219 Gastro-esophageal reflux disease without esophagitis: Secondary | ICD-10-CM | POA: Diagnosis present

## 2013-02-21 DIAGNOSIS — E119 Type 2 diabetes mellitus without complications: Secondary | ICD-10-CM | POA: Diagnosis present

## 2013-02-21 DIAGNOSIS — R531 Weakness: Secondary | ICD-10-CM

## 2013-02-21 LAB — CBC
HCT: 30.5 % — ABNORMAL LOW (ref 36.0–46.0)
MCH: 28.6 pg (ref 26.0–34.0)
MCHC: 33.1 g/dL (ref 30.0–36.0)
MCV: 86.4 fL (ref 78.0–100.0)
Platelets: 208 10*3/uL (ref 150–400)
RDW: 16 % — ABNORMAL HIGH (ref 11.5–15.5)
WBC: 9.3 10*3/uL (ref 4.0–10.5)

## 2013-02-21 LAB — SODIUM, URINE, RANDOM: Sodium, Ur: 87 mEq/L

## 2013-02-21 LAB — URINALYSIS W MICROSCOPIC + REFLEX CULTURE
Ketones, ur: NEGATIVE mg/dL
Leukocytes, UA: NEGATIVE
Nitrite: NEGATIVE
Protein, ur: NEGATIVE mg/dL
Urobilinogen, UA: 0.2 mg/dL (ref 0.0–1.0)

## 2013-02-21 LAB — CREATININE, URINE, RANDOM: Creatinine, Urine: 12.57 mg/dL

## 2013-02-21 LAB — BASIC METABOLIC PANEL
BUN: 10 mg/dL (ref 6–23)
Calcium: 8.6 mg/dL (ref 8.4–10.5)
Creatinine, Ser: 0.85 mg/dL (ref 0.50–1.10)
GFR calc Af Amer: 77 mL/min — ABNORMAL LOW (ref >90)
GFR calc non Af Amer: 66 mL/min — ABNORMAL LOW (ref >90)

## 2013-02-21 LAB — OSMOLALITY: Osmolality: 296 mOsm/kg (ref 275–300)

## 2013-02-21 LAB — OSMOLALITY, URINE: Osmolality, Ur: 265 mOsm/kg — ABNORMAL LOW (ref 390–1090)

## 2013-02-21 LAB — PRO B NATRIURETIC PEPTIDE: Pro B Natriuretic peptide (BNP): 1225 pg/mL — ABNORMAL HIGH (ref 0–125)

## 2013-02-21 MED ORDER — MAGNESIUM OXIDE 400 (241.3 MG) MG PO TABS
400.0000 mg | ORAL_TABLET | Freq: Every day | ORAL | Status: DC
  Administered 2013-02-22 – 2013-02-24 (×3): 400 mg via ORAL
  Filled 2013-02-21 (×3): qty 1

## 2013-02-21 MED ORDER — PANTOPRAZOLE SODIUM 40 MG PO TBEC
40.0000 mg | DELAYED_RELEASE_TABLET | Freq: Every day | ORAL | Status: DC
  Administered 2013-02-22 – 2013-02-24 (×3): 40 mg via ORAL
  Filled 2013-02-21 (×3): qty 1

## 2013-02-21 MED ORDER — IPRATROPIUM BROMIDE 0.02 % IN SOLN
0.5000 mg | Freq: Once | RESPIRATORY_TRACT | Status: AC
  Administered 2013-02-21: 0.5 mg via RESPIRATORY_TRACT
  Filled 2013-02-21: qty 2.5

## 2013-02-21 MED ORDER — ALBUTEROL SULFATE (5 MG/ML) 0.5% IN NEBU
2.5000 mg | INHALATION_SOLUTION | RESPIRATORY_TRACT | Status: DC | PRN
  Filled 2013-02-21: qty 0.5

## 2013-02-21 MED ORDER — ACETAMINOPHEN 500 MG PO TABS
500.0000 mg | ORAL_TABLET | ORAL | Status: DC | PRN
  Administered 2013-02-24: 500 mg via ORAL
  Filled 2013-02-21: qty 1

## 2013-02-21 MED ORDER — BIOTENE DRY MOUTH MT LIQD
15.0000 mL | Freq: Every day | OROMUCOSAL | Status: DC
  Administered 2013-02-21 – 2013-02-23 (×2): 15 mL via OROMUCOSAL

## 2013-02-21 MED ORDER — NITROGLYCERIN 2 % TD OINT
0.5000 [in_us] | TOPICAL_OINTMENT | Freq: Four times a day (QID) | TRANSDERMAL | Status: DC
  Administered 2013-02-21 – 2013-02-23 (×7): 0.5 [in_us] via TOPICAL
  Filled 2013-02-21: qty 30

## 2013-02-21 MED ORDER — DONEPEZIL HCL 10 MG PO TABS
10.0000 mg | ORAL_TABLET | Freq: Every evening | ORAL | Status: DC
  Administered 2013-02-21 – 2013-02-23 (×3): 10 mg via ORAL
  Filled 2013-02-21 (×5): qty 1

## 2013-02-21 MED ORDER — ALUM & MAG HYDROXIDE-SIMETH 200-200-20 MG/5ML PO SUSP: 30.0000 mL | Freq: Four times a day (QID) | ORAL | Status: DC | PRN

## 2013-02-21 MED ORDER — LAMOTRIGINE 200 MG PO TABS
200.0000 mg | ORAL_TABLET | Freq: Two times a day (BID) | ORAL | Status: DC
  Administered 2013-02-21 – 2013-02-22 (×3): 200 mg via ORAL
  Filled 2013-02-21 (×5): qty 1

## 2013-02-21 MED ORDER — GUAIFENESIN-DM 100-10 MG/5ML PO SYRP: 5.0000 mL | ORAL_SOLUTION | ORAL | Status: DC | PRN

## 2013-02-21 MED ORDER — LISINOPRIL 40 MG PO TABS
40.0000 mg | ORAL_TABLET | Freq: Every day | ORAL | Status: DC
  Administered 2013-02-22 – 2013-02-24 (×3): 40 mg via ORAL
  Filled 2013-02-21 (×3): qty 1

## 2013-02-21 MED ORDER — ONDANSETRON HCL 4 MG/2ML IJ SOLN: 4.0000 mg | Freq: Four times a day (QID) | INTRAMUSCULAR | Status: DC | PRN

## 2013-02-21 MED ORDER — LOPERAMIDE HCL 2 MG PO CAPS: 2.0000 mg | ORAL_CAPSULE | ORAL | Status: DC | PRN

## 2013-02-21 MED ORDER — METHYLPREDNISOLONE SODIUM SUCC 125 MG IJ SOLR: 60.0000 mg | Freq: Once | INTRAMUSCULAR | Status: DC

## 2013-02-21 MED ORDER — LEVOFLOXACIN 500 MG PO TABS
500.0000 mg | ORAL_TABLET | Freq: Every day | ORAL | Status: DC
  Administered 2013-02-21 – 2013-02-24 (×4): 500 mg via ORAL
  Filled 2013-02-21 (×4): qty 1

## 2013-02-21 MED ORDER — QUETIAPINE FUMARATE 50 MG PO TABS
50.0000 mg | ORAL_TABLET | Freq: Every day | ORAL | Status: DC
  Administered 2013-02-22: 50 mg via ORAL
  Filled 2013-02-21 (×2): qty 1

## 2013-02-21 MED ORDER — BUDESONIDE-FORMOTEROL FUMARATE 160-4.5 MCG/ACT IN AERO
2.0000 | INHALATION_SPRAY | Freq: Two times a day (BID) | RESPIRATORY_TRACT | Status: DC
  Administered 2013-02-21 – 2013-02-24 (×6): 2 via RESPIRATORY_TRACT
  Filled 2013-02-21: qty 6

## 2013-02-21 MED ORDER — METHYLPREDNISOLONE SODIUM SUCC 125 MG IJ SOLR
60.0000 mg | Freq: Three times a day (TID) | INTRAMUSCULAR | Status: DC
  Administered 2013-02-21 – 2013-02-23 (×5): 60 mg via INTRAVENOUS
  Filled 2013-02-21 (×8): qty 0.96

## 2013-02-21 MED ORDER — FUROSEMIDE 10 MG/ML IJ SOLN
40.0000 mg | Freq: Once | INTRAMUSCULAR | Status: AC
  Administered 2013-02-21: 40 mg via INTRAVENOUS
  Filled 2013-02-21: qty 4

## 2013-02-21 MED ORDER — MAGNESIUM OXIDE 400 MG PO TABS: 400.0000 mg | ORAL_TABLET | Freq: Every day | ORAL | Status: DC

## 2013-02-21 MED ORDER — CLOPIDOGREL BISULFATE 75 MG PO TABS
75.0000 mg | ORAL_TABLET | Freq: Every day | ORAL | Status: DC
  Administered 2013-02-22 – 2013-02-24 (×3): 75 mg via ORAL
  Filled 2013-02-21 (×3): qty 1

## 2013-02-21 MED ORDER — VERAPAMIL HCL ER 120 MG PO TBCR
120.0000 mg | EXTENDED_RELEASE_TABLET | Freq: Every day | ORAL | Status: DC
  Administered 2013-02-22 – 2013-02-24 (×3): 120 mg via ORAL
  Filled 2013-02-21 (×3): qty 1

## 2013-02-21 MED ORDER — POLYETHYLENE GLYCOL 3350 17 G PO PACK
17.0000 g | PACK | Freq: Every day | ORAL | Status: DC
  Administered 2013-02-22 – 2013-02-23 (×2): 17 g via ORAL
  Filled 2013-02-21 (×3): qty 1

## 2013-02-21 MED ORDER — HEPARIN SODIUM (PORCINE) 5000 UNIT/ML IJ SOLN
5000.0000 [IU] | Freq: Three times a day (TID) | INTRAMUSCULAR | Status: DC
  Administered 2013-02-21 – 2013-02-24 (×8): 5000 [IU] via SUBCUTANEOUS
  Filled 2013-02-21 (×11): qty 1

## 2013-02-21 MED ORDER — DOCUSATE SODIUM 100 MG PO CAPS
100.0000 mg | ORAL_CAPSULE | Freq: Two times a day (BID) | ORAL | Status: DC
  Administered 2013-02-21 – 2013-02-23 (×5): 100 mg via ORAL
  Filled 2013-02-21 (×9): qty 1

## 2013-02-21 MED ORDER — INSULIN ASPART 100 UNIT/ML ~~LOC~~ SOLN
0.0000 [IU] | Freq: Three times a day (TID) | SUBCUTANEOUS | Status: DC
  Administered 2013-02-22 – 2013-02-23 (×3): 2 [IU] via SUBCUTANEOUS
  Administered 2013-02-23 – 2013-02-24 (×2): 1 [IU] via SUBCUTANEOUS

## 2013-02-21 MED ORDER — LORATADINE 10 MG PO TABS
10.0000 mg | ORAL_TABLET | Freq: Every day | ORAL | Status: DC
  Administered 2013-02-22 – 2013-02-24 (×3): 10 mg via ORAL
  Filled 2013-02-21 (×3): qty 1

## 2013-02-21 MED ORDER — METOLAZONE 2.5 MG PO TABS
2.5000 mg | ORAL_TABLET | Freq: Once | ORAL | Status: AC
  Administered 2013-02-21: 2.5 mg via ORAL
  Filled 2013-02-21: qty 1

## 2013-02-21 MED ORDER — ATORVASTATIN CALCIUM 40 MG PO TABS
40.0000 mg | ORAL_TABLET | Freq: Every day | ORAL | Status: DC
  Administered 2013-02-22 – 2013-02-24 (×3): 40 mg via ORAL
  Filled 2013-02-21 (×3): qty 1

## 2013-02-21 MED ORDER — VITAMIN D3 25 MCG (1000 UNIT) PO TABS
1000.0000 [IU] | ORAL_TABLET | Freq: Every day | ORAL | Status: DC
  Administered 2013-02-22 – 2013-02-24 (×3): 1000 [IU] via ORAL
  Filled 2013-02-21 (×3): qty 1

## 2013-02-21 MED ORDER — LORAZEPAM 1 MG PO TABS
1.0000 mg | ORAL_TABLET | Freq: Two times a day (BID) | ORAL | Status: DC
  Administered 2013-02-21 – 2013-02-24 (×6): 1 mg via ORAL
  Filled 2013-02-21 (×6): qty 1

## 2013-02-21 MED ORDER — CARVEDILOL 12.5 MG PO TABS
12.5000 mg | ORAL_TABLET | Freq: Two times a day (BID) | ORAL | Status: DC
  Administered 2013-02-21 – 2013-02-24 (×6): 12.5 mg via ORAL
  Filled 2013-02-21 (×7): qty 1

## 2013-02-21 MED ORDER — ALBUTEROL SULFATE (5 MG/ML) 0.5% IN NEBU: 2.5000 mg | INHALATION_SOLUTION | RESPIRATORY_TRACT | Status: DC | PRN

## 2013-02-21 MED ORDER — METHYLPREDNISOLONE SODIUM SUCC 125 MG IJ SOLR: 125.0000 mg | Freq: Once | INTRAMUSCULAR | Status: DC

## 2013-02-21 MED ORDER — ALBUTEROL SULFATE HFA 108 (90 BASE) MCG/ACT IN AERS
2.0000 | INHALATION_SPRAY | Freq: Four times a day (QID) | RESPIRATORY_TRACT | Status: DC
  Administered 2013-02-21: 2 via RESPIRATORY_TRACT
  Filled 2013-02-21: qty 6.7

## 2013-02-21 MED ORDER — VITAMIN B-12 1000 MCG PO TABS
1000.0000 ug | ORAL_TABLET | Freq: Every day | ORAL | Status: DC
  Administered 2013-02-22 – 2013-02-24 (×3): 1000 ug via ORAL
  Filled 2013-02-21 (×3): qty 1

## 2013-02-21 MED ORDER — GUAIFENESIN 100 MG/5ML PO SOLN: 10.0000 mL | Freq: Four times a day (QID) | ORAL | Status: DC | PRN

## 2013-02-21 MED ORDER — LOPERAMIDE HCL 2 MG PO TABS: 2.0000 mg | ORAL_TABLET | ORAL | Status: DC | PRN

## 2013-02-21 MED ORDER — ALBUTEROL 90 MCG/ACT IN AERS: 2.0000 | INHALATION_SPRAY | Freq: Four times a day (QID) | RESPIRATORY_TRACT | Status: DC

## 2013-02-21 MED ORDER — ONDANSETRON HCL 4 MG PO TABS: 4.0000 mg | ORAL_TABLET | Freq: Four times a day (QID) | ORAL | Status: DC | PRN

## 2013-02-21 MED ORDER — FUROSEMIDE 10 MG/ML IJ SOLN
40.0000 mg | Freq: Two times a day (BID) | INTRAMUSCULAR | Status: DC
  Administered 2013-02-21 – 2013-02-22 (×3): 40 mg via INTRAVENOUS
  Filled 2013-02-21 (×6): qty 4

## 2013-02-21 MED ORDER — MORPHINE SULFATE 2 MG/ML IJ SOLN: 2.0000 mg | INTRAMUSCULAR | Status: DC | PRN

## 2013-02-21 NOTE — H&P (Signed)
[facility]                                                                                    Patient Demographics  Cindy Padilla, is a 72 y.o. female  MRN: [id number]   DOB - May 12, 1941  Admit Date - 02/21/2013  Outpatient Primary MD for the patient is Eloisa Northern, MD   With History of -  Past Medical History  Diagnosis Date  . Hypertension   . GERD (gastroesophageal reflux disease)   . Dementia   . IBS (irritable bowel syndrome)   . Arthritis   . Constipation   . Hiatal hernia   . Bipolar 1 disorder   . Diabetes mellitus   . Bronchitis   . Chronic back pain       History reviewed. No pertinent past surgical history.  in for   Chief Complaint  Patient presents with  . Shortness of Breath     HPI  Cindy Padilla  is a 72 y.o. female, With history of COPD on 2 L home oxygen, dementia, hypertension, dyslipidemia, Fluid retention, hyponatremia, who is recently admitted to the hospital a few weeks ago for hyponatremia and at that time her high-dose diuretics were held and she was discharged back to assisted living facility, she now presents to the hospital with a one-day history of progressively worsening shortness of breath and orthopnea, she has also noticed massive edema in her legs, in the ER her workup was consistent with Acute respiratory failure caused by COPD exacerbation along with acute on chronic nonspecific CHF,Her initial blood work, EKG and chest x-ray were unremarkable except for elevated proBNP and I was called to admit the patient.    Review of Systems    In addition to the HPI above,  No Fever-chills, No Headache, No changes with Vision or hearing, No problems swallowing food or Liquids, No Chest pain, + dry Cough , ++  Shortness of Breath Wrong with orthopnea and leg edema, No Abdominal pain, No Nausea or Vommitting, Bowel movements are regular, No Blood in stool or Urine, No dysuria, No new skin rashes or bruises, No new joints  pains-aches,  No new weakness, tingling, numbness in any extremity, No recent weight loss, No polyuria, polydypsia or polyphagia, No significant Mental Stressors.  A full 10 point Review of Systems was done, except as stated above, all other Review of Systems were negative.   Social History History  Substance Use Topics  . Smoking status: Never Smoker   . Smokeless tobacco: Never Used  . Alcohol Use: No      Family History No history of CAD at young age  Prior to Admission medications   Medication Sig Start Date End Date Taking? Authorizing Provider  acetaminophen (TYLENOL) 500 MG tablet Take 500 mg by mouth every 4 (four) hours as needed for pain or fever.   Yes Historical Provider, MD  albuterol (PROVENTIL,VENTOLIN) 90 MCG/ACT inhaler Inhale 2 puffs into the lungs every 4 (four) hours as needed. Wheezing and shortness of breath    Yes Historical Provider, MD  alum & mag hydroxide-simeth (MAALOX/MYLANTA) 200-200-20 MG/5ML suspension Take 30 mLs by mouth every 6 (six)  hours as needed for indigestion.   Yes Historical Provider, MD  antiseptic oral rinse (BIOTENE) LIQD 15 mLs by Mouth Rinse route at bedtime.   Yes Historical Provider, MD  atorvastatin (LIPITOR) 40 MG tablet Take 40 mg by mouth daily.    Yes Historical Provider, MD  budesonide-formoterol (SYMBICORT) 160-4.5 MCG/ACT inhaler Inhale 2 puffs into the lungs 2 (two) times daily.   Yes Historical Provider, MD  carvedilol (COREG) 12.5 MG tablet Take 12.5 mg by mouth 2 (two) times daily.   Yes Historical Provider, MD  cholecalciferol (VITAMIN D) 1000 UNITS tablet Take 1,000 Units by mouth daily.   Yes Historical Provider, MD  clopidogrel (PLAVIX) 75 MG tablet Take 75 mg by mouth daily.    Yes Historical Provider, MD  docusate sodium (COLACE) 100 MG capsule Take 100 mg by mouth 2 (two) times daily.   Yes Historical Provider, MD  donepezil (ARICEPT) 10 MG tablet Take 10 mg by mouth every evening.    Yes Historical Provider, MD   fluticasone (FLONASE) 50 MCG/ACT nasal spray Place 2 sprays into the nose daily.    Yes Historical Provider, MD  guaiFENesin (ROBITUSSIN) 100 MG/5ML SOLN Take 10 mLs by mouth every 6 (six) hours as needed (cough).   Yes Historical Provider, MD  ipratropium-albuterol (DUONEB) 0.5-2.5 (3) MG/3ML SOLN Take 3 mLs by nebulization 2 (two) times daily.   Yes Historical Provider, MD  lamoTRIgine (LAMICTAL) 200 MG tablet Take 200 mg by mouth 2 (two) times daily.   Yes Historical Provider, MD  lisinopril (PRINIVIL,ZESTRIL) 40 MG tablet Take 40 mg by mouth daily.    Yes Historical Provider, MD  loperamide (IMODIUM A-D) 2 MG tablet Take 2 mg by mouth as needed for diarrhea or loose stools.   Yes Historical Provider, MD  loratadine (CLARITIN) 10 MG tablet Take 10 mg by mouth daily.    Yes Historical Provider, MD  LORazepam (ATIVAN) 1 MG tablet Take 1 mg by mouth every 12 (twelve) hours.   Yes Historical Provider, MD  Magnesium Hydroxide (MILK OF MAGNESIA PO) Take 30 mLs by mouth at bedtime as needed (as needed for constipation).   Yes Historical Provider, MD  magnesium oxide (MAG-OX) 400 MG tablet Take 400 mg by mouth daily.   Yes Historical Provider, MD  pantoprazole (PROTONIX) 40 MG tablet Take 40 mg by mouth daily.    Yes Historical Provider, MD  polyethylene glycol (MIRALAX / GLYCOLAX) packet Take 17 g by mouth daily.    Yes Historical Provider, MD  QUEtiapine (SEROQUEL) 50 MG tablet Take 50 mg by mouth daily.   Yes Historical Provider, MD  verapamil (CALAN-SR) 120 MG CR tablet Take 120 mg by mouth daily.    Yes Historical Provider, MD  vitamin B-12 (CYANOCOBALAMIN) 1000 MCG tablet Take 1,000 mcg by mouth daily.    Yes Historical Provider, MD    Allergies  Allergen Reactions  . Aspirin     unknown  . Codeine     unknown  . Ivp Dye [Iodinated Diagnostic Agents]     unknown  . Morphine And Related     unknown  . Neomycin     unknown  . Penicillins     unknown  . Promethazine     unknown  .  Sulfa Antibiotics     unknown  . Tetracyclines & Related     unknown    Physical Exam  Vitals  Blood pressure 176/109, pulse 64, temperature 100.8 F (38.2 C), temperature source Rectal, resp. rate  20, SpO2 94.00%.   1. General Elderly white female lying in bed in Mild shortness of breath with audible wheezes,  2. Normal affect and insight, Not Suicidal or Homicidal, Awake Alert, Oriented X 3.  3. No F.N deficits, ALL C.Nerves Intact, Strength 5/5 all 4 extremities, Sensation intact all 4 extremities, Plantars down going.  4. Ears and Eyes appear Normal, Conjunctivae clear, PERRLA. Moist Oral Mucosa.  5. Supple Neck, ++ JVD, No cervical lymphadenopathy appriciated, No Carotid Bruits.  6. Symmetrical Chest wall movement, Good air movement bilaterally, Coarse rales and wheezing  7. RRR, No Gallops, Rubs or Murmurs, No Parasternal Heave.  8. Positive Bowel Sounds, Abdomen Soft, Non tender, No organomegaly appriciated,No rebound -guarding or rigidity.  9.  No Cyanosis, Normal Skin Turgor, No Skin Rash or Bruise. 3+ edema  10. Good muscle tone,  joints appear normal , no effusions, Normal ROM.  11. No Palpable Lymph Nodes in Neck or Axillae     Data Review  CBC  Recent Labs Lab 02/21/13 1540  WBC 9.3  HGB 10.1*  HCT 30.5*  PLT 208  MCV 86.4  MCH 28.6  MCHC 33.1  RDW 16.0*   ------------------------------------------------------------------------------------------------------------------  Chemistries   Recent Labs Lab 02/21/13 1540  NA 134*  K 4.6  CL 100  CO2 23  GLUCOSE 118*  BUN 10  CREATININE 0.85  CALCIUM 8.6   ------------------------------------------------------------------------------------------------------------------ CrCl is unknown because both a height and weight (above a minimum accepted value) are required for this  calculation. ------------------------------------------------------------------------------------------------------------------ No results found for this basename: TSH, T4TOTAL, FREET3, T3FREE, THYROIDAB,  in the last 72 hours   Coagulation profile No results found for this basename: INR, PROTIME,  in the last 168 hours ------------------------------------------------------------------------------------------------------------------- No results found for this basename: DDIMER,  in the last 72 hours -------------------------------------------------------------------------------------------------------------------  Cardiac Enzymes No results found for this basename: CK, CKMB, TROPONINI, MYOGLOBIN,  in the last 168 hours ------------------------------------------------------------------------------------------------------------------ No components found with this basename: POCBNP,    ---------------------------------------------------------------------------------------------------------------  Urinalysis    Component Value Date/Time   COLORURINE YELLOW 02/09/2013 1137   APPEARANCEUR CLEAR 02/09/2013 1137   LABSPEC 1.016 02/09/2013 1137   PHURINE 6.5 02/09/2013 1137   GLUCOSEU NEGATIVE 02/09/2013 1137   HGBUR MODERATE* 02/09/2013 1137   BILIRUBINUR NEGATIVE 02/09/2013 1137   KETONESUR NEGATIVE 02/09/2013 1137   PROTEINUR NEGATIVE 02/09/2013 1137   UROBILINOGEN 0.2 02/09/2013 1137   NITRITE NEGATIVE 02/09/2013 1137   LEUKOCYTESUR SMALL* 02/09/2013 1137    ----------------------------------------------------------------------------------------------------------------  Imaging results:   Dg Chest 2 View  02/09/2013   CLINICAL DATA:  72 year old female shortness of breath and anxiety.  EXAM: CHEST  2 VIEW  COMPARISON:  01/08/2013 and earlier.  FINDINGS: Semi upright AP and lateral views of the chest. Mildly lower lung volumes. Similar elevation of the right hemidiaphragm. The lungs are clear.  No pneumothorax. Stable cardiomegaly and mediastinal contours. Visualized tracheal air column is within normal limits. Left cervical spine stimulator type device re- identified. Calcified atherosclerosis of the aorta. No acute osseous abnormality identified. Right upper quadrant surgical clips.  IMPRESSION: Lower lung volumes, otherwise no acute cardiopulmonary abnormality.   Electronically Signed   By: Augusto Gamble M.D.   On: 02/09/2013 10:33   Dg Chest Port 1 View  02/21/2013   CLINICAL DATA:  Shortness of breath, asthma, COPD, hypertension, diabetes, initial encounter  EXAM: PORTABLE CHEST - 1 VIEW  COMPARISON:  Portable exam 1527 hr compared to 02/09/2013  FINDINGS: Generator pack left chest wall again noted  with lead extending into the left cervical region.  Enlargement of cardiac silhouette.  Atherosclerotic calcification aortic arch.  Mediastinal contours and pulmonary vascularity normal.  Bronchitic changes with minimal right basilar atelectasis.  Remaining lungs clear.  No gross pleural effusion or pneumothorax.  Bones demineralized.  IMPRESSION: Enlargement of cardiac silhouette.  Bronchitic changes with minimal right basilar atelectasis.   Electronically Signed   By: Ulyses Southward M.D.   On: 02/21/2013 15:49    My personal review of EKG: Rhythm NSR,  no Acute ST changes    Assessment & Plan    1.Acute respiratory failure in a patient with history of advanced COPD on 2 L nasal cannula oxygen who was recently taken off of her high doses of diuretics.- Patient does not have a formal history of CHF or any echo gram that is available in the chart, however her presentation is consistent with massive fluid overload, her wheezing could be secondary to fluid overload however COPD exacerbation cannot be ruled out.    A. Acute on chronic nonspecific CHF - I will admit her on a telemetry bed, place her on fluid and salt restriction, elevate head of the bed, oxygen and nebulizer treatments as needed, we'll  put her on nitro paste along with IV Lasix, Foley catheter will be placed for strict I&O, Her home dose beta blocker will be continued. Will obtain echo gram .   B.COPD exacerbation. In addition to above, place her on Solu-Medrol along with Levaquin.    2. Massive edema. We'll place her on scheduled Lasix, Zaroxolyn 1 dose, TED stockings, salt and fluid restriction. Foley catheter for strict urine output monitoring. We will try and remove Foley catheter in 48 hours.    3. Dyslipidemia continue home dose statin    4. Hypertension patient is on Coreg, Cardizem along with lisinopril they will be continued. Question if she has any remote history of atrial fibrillation, however nothing was mentioned in the Old chart or in her problem list.We'll monitor on telemetry    5.Mention of type 2 diabetes mellitus on her problem list. She's not on any diabetic medications, we'll check A1c, since she'll be placed on high-dose Solu-Medrol will place her on sliding scale Q. A.c. For now.     DVT Prophylaxis Heparin    AM Labs Ordered, also please review Full Orders  Family Communication: Admission, patients condition and plan of care including tests being ordered have been discussed with the patient who indicates understanding and agree with the plan and Code Status.  Code Status DNR  Likely DC to  ALF  Condition GUARDED   Time spent in minutes : 35    Elspeth Blucher K M.D on 02/21/2013 at 5:15 PM  Between 7am to 7pm - Pager - [phone]  After 7pm go to [email or web address] - password [email or web address]  And look for the night coverage person covering me after hours  [facility] Office  [phone]

## 2013-02-21 NOTE — ED Notes (Signed)
Pt presented to ED with SOB and chest pressure.As per EMS pt ran out of Oxygen and fell SOB and chest pressure with SOB.On EMS arrival SAT 93 in RA. 10 albuterol and .5 atrovent nebs given by EMS Inj solumedrol 125mg  iv given by EMS.

## 2013-02-21 NOTE — ED Provider Notes (Signed)
CSN: [id number]     Arrival date & time 02/21/13  1445 History   First MD Initiated Contact with Patient 02/21/13 1500     Chief Complaint  Patient presents with  . Shortness of Breath   (Consider location/radiation/quality/duration/timing/severity/associated sxs/prior Treatment) Patient is a 72 y.o. female presenting with shortness of breath. The history is provided by the patient.  Shortness of Breath Severity:  Moderate Onset quality:  Sudden Timing:  Constant Progression:  Waxing and waning Chronicity:  Chronic Context: emotional upset (angry with her roommate)   Context: not fumes, not pollens, not smoke exposure, not URI and not weather changes   Context comment:  Unable to get her oxygen tank changed this morning Relieved by:  Nothing Worsened by:  Nothing tried Ineffective treatments:  None tried Associated symptoms: chest pain (chest tightness, unable to get her breath), cough (feels wet, unable to cough up phlegm) and vomiting (once a few days ago)   Associated symptoms: no abdominal pain, no fever, no hemoptysis, no neck pain and no rash     Past Medical History  Diagnosis Date  . Hypertension   . GERD (gastroesophageal reflux disease)   . Dementia   . IBS (irritable bowel syndrome)   . Arthritis   . Constipation   . Hiatal hernia   . Bipolar 1 disorder   . Diabetes mellitus   . Bronchitis   . Chronic back pain    History reviewed. No pertinent past surgical history. No family history on file. History  Substance Use Topics  . Smoking status: Never Smoker   . Smokeless tobacco: Never Used  . Alcohol Use: No   OB History   Grav Para Term Preterm Abortions TAB SAB Ect Mult Living                 Review of Systems  Constitutional: Negative for fever.  HENT: Negative for neck pain.   Respiratory: Positive for cough (feels wet, unable to cough up phlegm) and shortness of breath. Negative for hemoptysis.   Cardiovascular: Positive for chest pain (chest  tightness, unable to get her breath).  Gastrointestinal: Positive for vomiting (once a few days ago). Negative for abdominal pain.  Skin: Negative for rash.  All other systems reviewed and are negative.    Allergies  Aspirin; Codeine; Ivp dye; Morphine and related; Neomycin; Penicillins; Promethazine; Sulfa antibiotics; and Tetracyclines & related  Home Medications   Current Outpatient Rx  Name  Route  Sig  Dispense  Refill  . acetaminophen (TYLENOL) 500 MG tablet   Oral   Take 500 mg by mouth every 4 (four) hours as needed for pain or fever.         Marland Kitchen albuterol (PROVENTIL,VENTOLIN) 90 MCG/ACT inhaler   Inhalation   Inhale 2 puffs into the lungs every 4 (four) hours as needed. Wheezing and shortness of breath          . alum & mag hydroxide-simeth (MAALOX/MYLANTA) 200-200-20 MG/5ML suspension   Oral   Take 30 mLs by mouth every 6 (six) hours as needed for indigestion.         Marland Kitchen antiseptic oral rinse (BIOTENE) LIQD   Mouth Rinse   15 mLs by Mouth Rinse route at bedtime.         Marland Kitchen atorvastatin (LIPITOR) 40 MG tablet   Oral   Take 40 mg by mouth daily.          . budesonide-formoterol (SYMBICORT) 160-4.5 MCG/ACT inhaler   Inhalation  Inhale 2 puffs into the lungs 2 (two) times daily.         . carvedilol (COREG) 12.5 MG tablet   Oral   Take 12.5 mg by mouth 2 (two) times daily.         . cholecalciferol (VITAMIN D) 1000 UNITS tablet   Oral   Take 1,000 Units by mouth daily.         . clopidogrel (PLAVIX) 75 MG tablet   Oral   Take 75 mg by mouth daily.          Marland Kitchen docusate sodium (COLACE) 100 MG capsule   Oral   Take 100 mg by mouth 2 (two) times daily.         Marland Kitchen donepezil (ARICEPT) 10 MG tablet   Oral   Take 10 mg by mouth every evening.          . fluticasone (FLONASE) 50 MCG/ACT nasal spray   Nasal   Place 2 sprays into the nose daily.          Marland Kitchen guaiFENesin (ROBITUSSIN) 100 MG/5ML SOLN   Oral   Take 10 mLs by mouth every 6 (six)  hours as needed (cough).         Marland Kitchen ipratropium-albuterol (DUONEB) 0.5-2.5 (3) MG/3ML SOLN   Nebulization   Take 3 mLs by nebulization 2 (two) times daily.         Marland Kitchen lamoTRIgine (LAMICTAL) 200 MG tablet   Oral   Take 200 mg by mouth 2 (two) times daily.         Marland Kitchen lisinopril (PRINIVIL,ZESTRIL) 40 MG tablet   Oral   Take 40 mg by mouth daily.          Marland Kitchen loperamide (IMODIUM A-D) 2 MG tablet   Oral   Take 2 mg by mouth as needed for diarrhea or loose stools.         Marland Kitchen loratadine (CLARITIN) 10 MG tablet   Oral   Take 10 mg by mouth daily.          Marland Kitchen LORazepam (ATIVAN) 1 MG tablet   Oral   Take 1 mg by mouth every 12 (twelve) hours.         . Magnesium Hydroxide (MILK OF MAGNESIA PO)   Oral   Take 30 mLs by mouth at bedtime as needed (as needed for constipation).         . magnesium oxide (MAG-OX) 400 MG tablet   Oral   Take 400 mg by mouth daily.         . pantoprazole (PROTONIX) 40 MG tablet   Oral   Take 40 mg by mouth daily.          . polyethylene glycol (MIRALAX / GLYCOLAX) packet   Oral   Take 17 g by mouth daily.          . QUEtiapine (SEROQUEL) 50 MG tablet   Oral   Take 50 mg by mouth daily.         . verapamil (CALAN-SR) 120 MG CR tablet   Oral   Take 120 mg by mouth daily.          . vitamin B-12 (CYANOCOBALAMIN) 1000 MCG tablet   Oral   Take 1,000 mcg by mouth daily.           BP 164/62  Pulse 64  Temp(Src) 100.1 F (37.8 C) (Oral)  Resp 24  SpO2 98% Physical Exam  Nursing note and vitals  reviewed. Constitutional: She is oriented to person, place, and time. She appears well-developed and well-nourished. No distress.  HENT:  Head: Normocephalic and atraumatic.  Right Ear: External ear normal.  Left Ear: External ear normal.  Eyes: EOM are normal. Pupils are equal, round, and reactive to light.  Neck: Normal range of motion. Neck supple.  Cardiovascular: Normal rate and regular rhythm.  Exam reveals no friction rub.    No murmur heard. Pulmonary/Chest: She is in respiratory distress (mild, talking in choppy sentences). She has wheezes (diffuse, scattered). She has no rales.  Abdominal: Soft. She exhibits no distension. There is no tenderness. There is no rebound.  Musculoskeletal: Normal range of motion. She exhibits edema (2+, nonpitting edema in bilateral lower extremities).  Neurological: She is alert and oriented to person, place, and time. She exhibits normal muscle tone.  Skin: She is not diaphoretic.    ED Course  Procedures (including critical care time) Labs Review Labs Reviewed  CBC  BASIC METABOLIC PANEL  PRO B NATRIURETIC PEPTIDE   Imaging Review Dg Chest Port 1 View  02/21/2013   CLINICAL DATA:  Shortness of breath, asthma, COPD, hypertension, diabetes, initial encounter  EXAM: PORTABLE CHEST - 1 VIEW  COMPARISON:  Portable exam 1527 hr compared to 02/09/2013  FINDINGS: Generator pack left chest wall again noted with lead extending into the left cervical region.  Enlargement of cardiac silhouette.  Atherosclerotic calcification aortic arch.  Mediastinal contours and pulmonary vascularity normal.  Bronchitic changes with minimal right basilar atelectasis.  Remaining lungs clear.  No gross pleural effusion or pneumothorax.  Bones demineralized.  IMPRESSION: Enlargement of cardiac silhouette.  Bronchitic changes with minimal right basilar atelectasis.   Electronically Signed   By: Ulyses Southward M.D.   On: 02/21/2013 15:49     Date: 02/21/2013  Rate: 65  Rhythm: normal sinus rhythm  QRS Axis: normal  Intervals: normal  ST/T Wave abnormalities: normal  Conduction Disutrbances:none  Narrative Interpretation:   Old EKG Reviewed: unchanged    MDM   1. Acute exacerbation of CHF (congestive heart failure)   2. COPD exacerbation   3. Wheezing   4. Leg swelling   5. Acute respiratory failure   6. HTN (hypertension)   7. Hypokalemia   8. Hyponatremia    And a 72 year old female with  history of hypertension, hyperlipidemia, dementia, COPD and emphysema presents with shortness of breath. She states multiple reasons for shortness of breath. She is extremely upset and aggravated her room in that the staff of her nursing facility. She states she could get her oxygen tank refill today. She states she's on 2 L of oxygen at baseline. She states emphysema on her medical history, however is not listed in the computer. Here she is on a nebulizer on my initial exam, talking in short choppy sentences. She has diffuse wheezing on exam that is mild. She was recently admitted for hyponatremia generalized weakness. At that time she received nebulizers and steroids in the ED. She also states recent course of Ceftin for bronchitis. Patient here with normal sinus rhythm on the monitor, 98% on 2 L nasal cannula with good waveform. Chest x-ray, cardiac enzymes, basic labs. Will also give her nebulizers. She received steroids with EMS. CXR without fluid overload. BNP elevated. On re-exam, moving much better air, more relaxed with her breathing after nebulizers. Patient's chart shows Coreg and was previously on Lasix - unclear if she had been diagnosed with CHF previously. Not currently on Lasix per nursing home  staff when I called - was supposed to be taking it per the D/C summary.  Admitted to medicine for CHF, COPD. I have reviewed all labs and imaging and considered them in my medical decision making.     Dagmar Hait, MD 02/21/13 [phone]

## 2013-02-22 LAB — CBC
HCT: 31.5 % — ABNORMAL LOW (ref 36.0–46.0)
Hemoglobin: 10.9 g/dL — ABNORMAL LOW (ref 12.0–15.0)
MCHC: 34.6 g/dL (ref 30.0–36.0)
MCV: 83.6 fL (ref 78.0–100.0)
RBC: 3.77 MIL/uL — ABNORMAL LOW (ref 3.87–5.11)
RDW: 15.4 % (ref 11.5–15.5)
WBC: 7.7 10*3/uL (ref 4.0–10.5)

## 2013-02-22 LAB — BASIC METABOLIC PANEL
BUN: 18 mg/dL (ref 6–23)
CO2: 27 mEq/L (ref 19–32)
Chloride: 92 mEq/L — ABNORMAL LOW (ref 96–112)
Creatinine, Ser: 0.85 mg/dL (ref 0.50–1.10)
GFR calc Af Amer: 77 mL/min — ABNORMAL LOW (ref >90)
Potassium: 3.2 mEq/L — ABNORMAL LOW (ref 3.5–5.1)

## 2013-02-22 LAB — GLUCOSE, CAPILLARY
Glucose-Capillary: 200 mg/dL — ABNORMAL HIGH (ref 70–99)
Glucose-Capillary: 207 mg/dL — ABNORMAL HIGH (ref 70–99)

## 2013-02-22 LAB — HEMOGLOBIN A1C
Hgb A1c MFr Bld: 6.6 % — ABNORMAL HIGH (ref <5.7)
Mean Plasma Glucose: 143 mg/dL — ABNORMAL HIGH (ref <117)

## 2013-02-22 LAB — TSH: TSH: 1.326 u[IU]/mL (ref 0.350–4.500)

## 2013-02-22 MED ORDER — ALBUTEROL SULFATE HFA 108 (90 BASE) MCG/ACT IN AERS
2.0000 | INHALATION_SPRAY | Freq: Three times a day (TID) | RESPIRATORY_TRACT | Status: DC
  Administered 2013-02-22 – 2013-02-24 (×6): 2 via RESPIRATORY_TRACT

## 2013-02-22 NOTE — Progress Notes (Signed)
[facility] PROGRESS NOTE  Cindy Padilla MVH:[id number] DOB: 1940/08/02 DOA: 02/21/2013 PCP: Eloisa Northern, MD  Assessment/Plan: 1. Likely acute decompensated congestive heart failure. Patient presenting with clinical signs and symptoms of acute CHF, initial lab work showing an elevated BNP of 1225.chest x-ray did not reveal obvious infiltrate. Since admission she has diuresis of approximately 3.6 L of urine. A transthoracic echocardiogram has been ordered and is pending at the time of this dictation. Clinically she is improving, reports breathing easier this morning. Will continue IV diuresis for one more day, continue monitoring ins and outs. 2. COPD exacerbation. Patient with history of COPD, having expiratory wheezes on lung exam. I suspect acute decompensated congestive heart failure may have precipitated a COPD exacerbation. Will continue IV steroids, empiric antibiotic therapy with Levaquin, duo nebs and supplemental oxygen. 3. Acute hypoxemic respiratory failure. Patient presenting with labored breathing, respiratory rate 24, require supplemental oxygen. Likely secondary to combined COPD exacerbation as well as acute decompensated congestive heart failure. She was started on IV Lasix, IV steroids, DuoNeb, supplemental oxygen, and empiric antimicrobial therapy. 4. Hypertension. We'll continue Coreg, Cardizem and lisinopril therapy. 5. Bipolar disorder. Stable, continue Seroquel 6. Dyslipidemia. Continue statin therapy 7. DVT prophylaxis:   Code Status: Full code Family Communication: Plan discussed with patient Disposition Plan: Will continue IV diuresis, monitor ins and outs, supplemental oxygen, steroids   Antibiotics:  Levaquin 02/21/13  HPI/Subjective: Patient is a pleasant 72 year old female with history of congestive heart failure, presenting with increasing shortness of breath who was admitted overnight for probable acute decompensated congestive heart failure. She was  started on Lasix 40 mg IV twice a day, putting out a total of 3.6 L of urine since presentation. This morning she reports feeling better, is up, tolerating by mouth intake.  Objective: Filed Vitals:   02/22/13 0936  BP:   Pulse: 71  Temp:   Resp: 16    Intake/Output Summary (Last 24 hours) at 02/22/13 1117 Last data filed at 02/22/13 1021  Gross per 24 hour  Intake    240 ml  Output   5700 ml  Net  -5460 ml   Filed Weights   02/21/13 1752 02/22/13 0341  Weight: 86.909 kg (191 lb 9.6 oz) 82.781 kg (182 lb 8 oz)    Exam:   General:  Patient is in no acute distress, reports feeling better this morning  Cardiovascular: Regular rate and rhythm normal S1-S2, 2 out of 6 systolic ejection murmur  Respiratory: Diminished breath sounds bilaterally, persistent by lateral wheezes, bibasilar crackles also noted.  Abdomen: Soft nontender nondistended positive bowels  Musculoskeletal: Present range of motion to all external  Extremities: 2-3+ bilateral extremity pitting edema noted, skin changes likely due to 2 venous stasis noted   Data Reviewed: Basic Metabolic Panel:  Recent Labs Lab 02/21/13 1540 02/22/13 0445  NA 134* 135  K 4.6 3.2*  CL 100 92*  CO2 23 27  GLUCOSE 118* 156*  BUN 10 18  CREATININE 0.85 0.85  CALCIUM 8.6 9.3   Liver Function Tests: No results found for this basename: AST, ALT, ALKPHOS, BILITOT, PROT, ALBUMIN,  in the last 168 hours No results found for this basename: LIPASE, AMYLASE,  in the last 168 hours No results found for this basename: AMMONIA,  in the last 168 hours CBC:  Recent Labs Lab 02/21/13 1540 02/22/13 0445  WBC 9.3 7.7  HGB 10.1* 10.9*  HCT 30.5* 31.5*  MCV 86.4 83.6  PLT 208 251   Cardiac Enzymes:  No results found for this basename: CKTOTAL, CKMB, CKMBINDEX, TROPONINI,  in the last 168 hours BNP (last 3 results)  Recent Labs  10/05/12 2220 02/21/13 1541  PROBNP 384.5* 1225.0*   CBG: No results found for this  basename: GLUCAP,  in the last 168 hours  No results found for this or any previous visit (from the past 240 hour(s)).   Studies: Dg Chest Port 1 View  02/21/2013   CLINICAL DATA:  Shortness of breath, asthma, COPD, hypertension, diabetes, initial encounter  EXAM: PORTABLE CHEST - 1 VIEW  COMPARISON:  Portable exam 1527 hr compared to 02/09/2013  FINDINGS: Generator pack left chest wall again noted with lead extending into the left cervical region.  Enlargement of cardiac silhouette.  Atherosclerotic calcification aortic arch.  Mediastinal contours and pulmonary vascularity normal.  Bronchitic changes with minimal right basilar atelectasis.  Remaining lungs clear.  No gross pleural effusion or pneumothorax.  Bones demineralized.  IMPRESSION: Enlargement of cardiac silhouette.  Bronchitic changes with minimal right basilar atelectasis.   Electronically Signed   By: Ulyses Southward M.D.   On: 02/21/2013 15:49    Scheduled Meds: . albuterol  2 puff Inhalation TID  . antiseptic oral rinse  15 mL Mouth Rinse QHS  . atorvastatin  40 mg Oral Daily  . budesonide-formoterol  2 puff Inhalation BID  . carvedilol  12.5 mg Oral BID  . cholecalciferol  1,000 Units Oral Daily  . clopidogrel  75 mg Oral Daily  . docusate sodium  100 mg Oral BID  . donepezil  10 mg Oral QPM  . furosemide  40 mg Intravenous BID  . heparin  5,000 Units Subcutaneous Q8H  . insulin aspart  0-9 Units Subcutaneous TID WC  . lamoTRIgine  200 mg Oral BID  . levofloxacin  500 mg Oral Daily  . lisinopril  40 mg Oral Daily  . loratadine  10 mg Oral Daily  . LORazepam  1 mg Oral Q12H  . magnesium oxide  400 mg Oral Daily  . methylPREDNISolone (SOLU-MEDROL) injection  60 mg Intravenous Q8H  . methylPREDNISolone (SOLU-MEDROL) injection  60 mg Intravenous Once  . nitroGLYCERIN  0.5 inch Topical Q6H  . pantoprazole  40 mg Oral Daily  . polyethylene glycol  17 g Oral Daily  . QUEtiapine  50 mg Oral Daily  . verapamil  120 mg Oral Daily   . vitamin B-12  1,000 mcg Oral Daily   Continuous Infusions:   Principal Problem:   Acute respiratory failure Active Problems:   Hyponatremia   SOB (shortness of breath)   HTN (hypertension)   COPD exacerbation    Time spent: 35 minutes    Jeralyn Bennett  [facility] Pager [phone]. If 7PM-7AM, please contact night-coverage at [email or web address], password [facility] 02/22/2013, 11:17 AM  LOS: 1 day

## 2013-02-22 NOTE — Evaluation (Signed)
Physical Therapy Evaluation Patient Details Name: MATTIE NORDELL MRN: [id number] DOB: Jan 09, 1941 Today's Date: 02/22/2013 Time: 1478-2956 PT Time Calculation (min): 27 min  PT Assessment / Plan / Recommendation History of Present Illness  pt is a 25 w.o. female who presents with SOB and chest tightness in the possible setting of COPD exacerbation and acute decompensated CHF.  Clinical Impression  Patient demonstrated deficits in functional mobility as indicated below. Patient will benefit from skilled PT to address deficits and maximize function. Rec return to ALF with HHPT upon discharge. Will continue to see as indicated.    PT Assessment  Patient needs continued PT services    Follow Up Recommendations  Home health PT    Does the patient have the potential to tolerate intense rehabilitation      Barriers to Discharge        Equipment Recommendations  None recommended by PT    Recommendations for Other Services     Frequency Min 3X/week    Precautions / Restrictions Precautions Precautions: Fall Restrictions Weight Bearing Restrictions: No   Pertinent Vitals/Pain Patient reports no pain, SpO2 on rm air with ambulation >92% at all times      Mobility  Bed Mobility Bed Mobility: Supine to Sit;Sit to Supine Supine to Sit: 5: Supervision;With rails;HOB flat Sit to Supine: 5: Supervision Details for Bed Mobility Assistance: Slightly impulsive Transfers Transfers: Sit to Stand;Stand to Sit Sit to Stand: 5: Supervision;With upper extremity assist;With armrests;From bed;From chair/3-in-1 Stand to Sit: 5: Supervision;With upper extremity assist;With armrests;To bed;To chair/3-in-1 Details for Transfer Assistance: No physical assist needed Ambulation/Gait Ambulation/Gait Assistance: 5: Supervision Ambulation Distance (Feet): 160 Feet Assistive device: Rolling walker Ambulation/Gait Assistance Details: no physical assist, patient steady with ambulation Gait  Pattern: Step-through pattern;Decreased stride length;Trunk flexed;Narrow base of support Gait velocity: varying General Gait Details: pt needs encouragement to walk- she states she needs to sleep- but once she is up she able to do fairly well with RW. Stairs: No Wheelchair Mobility Wheelchair Mobility: No        PT Diagnosis: Difficulty walking;Abnormality of gait;Generalized weakness  PT Problem List: Decreased safety awareness;Decreased mobility;Decreased activity tolerance PT Treatment Interventions: Gait training;Functional mobility training;Therapeutic activities;Therapeutic exercise;Balance training     PT Goals(Current goals can be found in the care plan section) Acute Rehab PT Goals Patient Stated Goal: to go back to ALF PT Goal Formulation: With patient Time For Goal Achievement: 03/08/13 Potential to Achieve Goals: Good  Visit Information  Last PT Received On: 02/22/13 Assistance Needed: +1 History of Present Illness: pt is a 46 w.o. female who presents with SOB and chest tightness in the possible setting of COPD exacerbation and acute decompensated CHF.       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, impulsive, cues to attend to task)    Extremity/Trunk Assessment Upper Extremity Assessment Upper Extremity Assessment: Defer to OT evaluation Lower Extremity Assessment Lower Extremity Assessment: Generalized weakness   Balance Balance Balance Assessed:  Yes Static Sitting Balance Static Sitting - Balance Support: No upper extremity supported;Feet supported Static Sitting - Level of Assistance: 7: Independent Static  Standing Balance Static Standing - Balance Support: Bilateral upper extremity supported Static Standing - Level of Assistance: 5: Stand by assistance Dynamic Standing Balance Dynamic Standing - Balance Support: During functional activity Dynamic Standing - Level of Assistance: 5: Stand by assistance Dynamic Standing - Balance Activities: Forward lean/weight shifting;Reaching across midline Dynamic Standing - Comments: performing hygiene  End of Session PT - End of Session Equipment Utilized During Treatment: Gait belt Activity Tolerance: Patient limited by fatigue Patient left: in bed;with call bell/phone within reach;with bed alarm set Nurse Communication: Mobility status  GP     Fabio Asa 02/22/2013, 2:10 PM Charlotte Crumb, PT DPT  [phone]

## 2013-02-22 NOTE — Progress Notes (Signed)
CSW attempted to speak to patient about dc planning. Patient working with PT, CSW will try again later.  Maree Krabbe, MSW, Theresia Majors [phone]

## 2013-02-22 NOTE — Clinical Documentation Improvement (Signed)
THIS DOCUMENT IS NOT A PERMANENT PART OF THE MEDICAL RECORD  Please update your documentation with the medical record to reflect your response to this query. If you need help knowing how to do this please call [phone].  02/22/13  Dear Dr. [facility],  In a better effort to capture your patient's severity of illness, reflect appropriate length of stay and utilization of resources, a review of the patient medical record has revealed the following indicators the diagnosis of Heart Failure.    Based on your clinical judgment, please clarify and document in a progress note and/or discharge summary the clinical condition associated with the following supporting information:   Possible Clinical Conditions?  Acute Systolic Congestive Heart Failure  Acute Diastolic Congestive Heart Failure  Acute Systolic & Diastolic Congestive Heart Failure  Other Condition  Cannot Clinically Determine     Probable acute decompensated CHF--IV Lasix twice daily, noted per 9/29 progress notes.    Reviewed: Additional documentation provided per 10/01 progress notes.                           Thank You,  Marciano Sequin,  Clinical Documentation Specialist: [phone] Health Information Management [place]

## 2013-02-23 ENCOUNTER — Encounter (HOSPITAL_COMMUNITY): Payer: Self-pay

## 2013-02-23 DIAGNOSIS — I517 Cardiomegaly: Secondary | ICD-10-CM

## 2013-02-23 LAB — BASIC METABOLIC PANEL
CO2: 31 mEq/L (ref 19–32)
Chloride: 90 mEq/L — ABNORMAL LOW (ref 96–112)
Glucose, Bld: 188 mg/dL — ABNORMAL HIGH (ref 70–99)
Potassium: 3.3 mEq/L — ABNORMAL LOW (ref 3.5–5.1)
Sodium: 137 mEq/L (ref 135–145)

## 2013-02-23 LAB — CBC
HCT: 34.2 % — ABNORMAL LOW (ref 36.0–46.0)
Hemoglobin: 12 g/dL (ref 12.0–15.0)
MCH: 29.1 pg (ref 26.0–34.0)
MCV: 82.8 fL (ref 78.0–100.0)
Platelets: 303 10*3/uL (ref 150–400)
RBC: 4.13 MIL/uL (ref 3.87–5.11)
WBC: 15.1 10*3/uL — ABNORMAL HIGH (ref 4.0–10.5)

## 2013-02-23 MED ORDER — PREDNISONE 50 MG PO TABS
60.0000 mg | ORAL_TABLET | Freq: Every day | ORAL | Status: DC
  Administered 2013-02-24: 60 mg via ORAL
  Filled 2013-02-23 (×2): qty 1

## 2013-02-23 MED ORDER — LAMOTRIGINE 200 MG PO TABS
200.0000 mg | ORAL_TABLET | Freq: Two times a day (BID) | ORAL | Status: DC
  Administered 2013-02-23 – 2013-02-24 (×3): 200 mg via ORAL
  Filled 2013-02-23 (×5): qty 1

## 2013-02-23 MED ORDER — LAMOTRIGINE 200 MG PO TABS
200.0000 mg | ORAL_TABLET | Freq: Two times a day (BID) | ORAL | Status: DC
  Filled 2013-02-23 (×2): qty 1

## 2013-02-23 MED ORDER — POTASSIUM CHLORIDE CRYS ER 20 MEQ PO TBCR
40.0000 meq | EXTENDED_RELEASE_TABLET | Freq: Once | ORAL | Status: AC
  Administered 2013-02-23: 40 meq via ORAL
  Filled 2013-02-23: qty 2

## 2013-02-23 MED ORDER — QUETIAPINE FUMARATE 50 MG PO TABS
50.0000 mg | ORAL_TABLET | ORAL | Status: DC
  Filled 2013-02-23: qty 1

## 2013-02-23 MED ORDER — QUETIAPINE FUMARATE 50 MG PO TABS
50.0000 mg | ORAL_TABLET | ORAL | Status: DC
  Administered 2013-02-23 – 2013-02-24 (×2): 50 mg via ORAL
  Filled 2013-02-23 (×2): qty 1

## 2013-02-23 MED ORDER — FUROSEMIDE 40 MG PO TABS
40.0000 mg | ORAL_TABLET | Freq: Two times a day (BID) | ORAL | Status: DC
  Administered 2013-02-23 – 2013-02-24 (×2): 40 mg via ORAL
  Filled 2013-02-23 (×4): qty 1

## 2013-02-23 NOTE — Care Management Note (Signed)
    Page 1 of 2   02/24/2013     12:01:02 PM   CARE MANAGEMENT NOTE 02/24/2013  Patient:  Cindy Padilla, Cindy Padilla   Account Number:  [id number]  Date Initiated:  02/23/2013  Documentation initiated by:  Sylar Voong  Subjective/Objective Assessment:   PT ADM ON 02/21/13 WITH COPD AND CHF EXACERBATION.  PTA, PT RESIDES AT ASSISTED LIVING FACILITY.     Action/Plan:   PT IS ACTIVE WITH [facility] FOR [facility] NEEDS.  WILL NEED RESUMPTION OF HH ORDERS PRIOR TO DC (P.T. AND O.T.) . CSW TO FACILITATE RETURN TO ALF.   Anticipated DC Date:  02/24/2013   Anticipated DC Plan:  ASSISTED LIVING / REST HOME  In-house referral  Clinical Social Worker      DC Planning Services  CM consult      [facility] Choice  Resumption Of Svcs/PTA Provider   Choice offered to / List presented to:  C-1 Patient        HH arranged  HH-1 RN  HH-2 PT  HH-3 OT      [facility] agency  [facility]   Status of service:  Completed, signed off Medicare Important Message given?   (If response is "NO", the following Medicare IM given date fields will be blank) Date Medicare IM given:   Date Additional Medicare IM given:    Discharge Disposition:  ASSISTED LIVING  Per UR Regulation:  Reviewed for med. necessity/level of care/duration of stay  If discussed at Long Length of Stay Meetings, dates discussed:    Comments:  02/24/13 Yekaterina Escutia,RN,BSN [phone] PT FOR DC BACK TO [facility] TODAY.  WILL NEED RESUMPTION OF HH SERVICES AS ORDERED; NOTIFIED [facility] OF DC DATE.  START OF CARE 24-48H POST DC DATE.

## 2013-02-23 NOTE — Progress Notes (Signed)
Clinical Social Work Department BRIEF PSYCHOSOCIAL ASSESSMENT 02/23/2013  Patient:  Cindy Padilla, Cindy Padilla     Account Number:  [id number]     Admit date:  02/21/2013  Clinical Social Worker:  Carren Rang  Date/Time:  02/23/2013 02:12 PM  Referred by:  Care Management  Date Referred:  02/22/2013 Referred for  ALF Placement   Other Referral:   Interview type:  Patient Other interview type:    PSYCHOSOCIAL DATA Living Status:  FACILITY Admitted from facility:  [facility] Level of care:  Assisted Living Primary support name:  Leonides Sake [phone] Primary support relationship to patient:  CHILD, ADULT Degree of support available:   Okay, patient states that she sees daughter a few times a month.    CURRENT CONCERNS Current Concerns  Post-Acute Placement   Other Concerns:   [facility]  [address]  [place], [place] [address]  [phone]    SOCIAL WORK ASSESSMENT / PLAN CSW received referral for ALF placement back to facility, [name]). CSW went into room and introduced self to patient. Patient confirmed patient is from [facility]. Patient stated that she wants to try and speak with her daughter and for CSW to follow up with daughter.   Assessment/plan status:  Psychosocial Support/Ongoing Assessment of Needs Other assessment/ plan:   Information/referral to community resources:   ALF    PATIENT'S/FAMILY'S RESPONSE TO PLAN OF CARE: Patient states she wants to return to ALF-       Maree Krabbe, MSW, [employer] [phone]

## 2013-02-23 NOTE — Progress Notes (Signed)
  Echocardiogram 2D Echocardiogram has been performed.  Arvil Chaco 02/23/2013, 2:56 PM

## 2013-02-23 NOTE — Progress Notes (Signed)
[facility] PROGRESS NOTE  Cindy Padilla ZOX:[id number] DOB: 04-27-1941 DOA: 02/21/2013 PCP: Eloisa Northern, MD  Assessment/Plan: 1. Likely acute decompensated congestive heart failure. Patient presenting with clinical signs and symptoms of acute CHF, initial lab work showing an elevated BNP of 1225. Chest x-ray did not reveal obvious infiltrate.  A transthoracic echocardiogram has been ordered and is pending at the time of this dictation. Clinically she appears improved, will transition her to oral Lasix at 40 mg by mouth twice a day. 2. COPD exacerbation. Likely precipitated by acute CHF. Will transition to oral prednisone 60 mg by mouth daily, continue nebs and Levaquin. 3. Acute hypoxemic respiratory failure. Patient presenting with labored breathing, respiratory rate 24, require supplemental oxygen. Likely secondary to combined COPD exacerbation as well as acute decompensated congestive heart failure. Improving 4. Hypertension. We'll continue Coreg, Cardizem and lisinopril therapy. 5. Bipolar disorder. Stable, continue Seroquel 6. Dyslipidemia. Continue statin therapy 7. DVT prophylaxis:   Code Status: Full code Family Communication: Plan discussed with patient Disposition Plan: Patient showing clinical improvement today, transition to oral steroids and oral diuretics. I anticipate discharge in the next 24 hours   Antibiotics:  Levaquin 02/21/13  HPI/Subjective: Patient states breathing easier today, also noting decrease in bilateral extremity edema. She continues to have some cough, exertional shortness of breath and wheezes though not as severe as on the day of presentation. She is tolerating by mouth intake, remains hemodynamically stable.  Objective: Filed Vitals:   02/23/13 1112  BP: 155/70  Pulse: 66  Temp:   Resp:     Intake/Output Summary (Last 24 hours) at 02/23/13 1249 Last data filed at 02/22/13 2212  Gross per 24 hour  Intake    480 ml  Output   1651 ml   Net  -1171 ml   Filed Weights   02/21/13 1752 02/22/13 0341 02/23/13 0541  Weight: 86.909 kg (191 lb 9.6 oz) 82.781 kg (182 lb 8 oz) 83.326 kg (183 lb 11.2 oz)    Exam:   General:  Patient is in no acute distress, reports feeling better this morning  Cardiovascular: Regular rate and rhythm normal S1-S2, 2 out of 6 systolic ejection murmur  Respiratory: improving lung exam, with the presence of diminished breath sounds bilaterally, by basilar crackles not as pronounced today as on yesterday's examination. Continues to have a few expiratory wheezes  Abdomen: Soft nontender nondistended positive bowels  Musculoskeletal: Present range of motion to all external  Extremities: Improved extremity edema  Data Reviewed: Basic Metabolic Panel:  Recent Labs Lab 02/21/13 1540 02/22/13 0445 02/23/13 0545  NA 134* 135 137  K 4.6 3.2* 3.3*  CL 100 92* 90*  CO2 23 27 31   GLUCOSE 118* 156* 188*  BUN 10 18 23   CREATININE 0.85 0.85 0.90  CALCIUM 8.6 9.3 9.7   Liver Function Tests: No results found for this basename: AST, ALT, ALKPHOS, BILITOT, PROT, ALBUMIN,  in the last 168 hours No results found for this basename: LIPASE, AMYLASE,  in the last 168 hours No results found for this basename: AMMONIA,  in the last 168 hours CBC:  Recent Labs Lab 02/21/13 1540 02/22/13 0445 02/23/13 0545  WBC 9.3 7.7 15.1*  HGB 10.1* 10.9* 12.0  HCT 30.5* 31.5* 34.2*  MCV 86.4 83.6 82.8  PLT 208 251 303   Cardiac Enzymes: No results found for this basename: CKTOTAL, CKMB, CKMBINDEX, TROPONINI,  in the last 168 hours BNP (last 3 results)  Recent Labs  10/05/12 2220 02/21/13 1541  PROBNP 384.5* 1225.0*   CBG:  Recent Labs Lab 02/22/13 1612 02/22/13 2131 02/23/13 0611 02/23/13 1120  GLUCAP 200* 207* 179* 155*    No results found for this or any previous visit (from the past 240 hour(s)).   Studies: Dg Chest Port 1 View  02/21/2013   CLINICAL DATA:  Shortness of breath, asthma,  COPD, hypertension, diabetes, initial encounter  EXAM: PORTABLE CHEST - 1 VIEW  COMPARISON:  Portable exam 1527 hr compared to 02/09/2013  FINDINGS: Generator pack left chest wall again noted with lead extending into the left cervical region.  Enlargement of cardiac silhouette.  Atherosclerotic calcification aortic arch.  Mediastinal contours and pulmonary vascularity normal.  Bronchitic changes with minimal right basilar atelectasis.  Remaining lungs clear.  No gross pleural effusion or pneumothorax.  Bones demineralized.  IMPRESSION: Enlargement of cardiac silhouette.  Bronchitic changes with minimal right basilar atelectasis.   Electronically Signed   By: Ulyses Southward M.D.   On: 02/21/2013 15:49    Scheduled Meds: . albuterol  2 puff Inhalation TID  . antiseptic oral rinse  15 mL Mouth Rinse QHS  . atorvastatin  40 mg Oral Daily  . budesonide-formoterol  2 puff Inhalation BID  . carvedilol  12.5 mg Oral BID  . cholecalciferol  1,000 Units Oral Daily  . clopidogrel  75 mg Oral Daily  . docusate sodium  100 mg Oral BID  . donepezil  10 mg Oral QPM  . furosemide  40 mg Oral BID  . heparin  5,000 Units Subcutaneous Q8H  . insulin aspart  0-9 Units Subcutaneous TID WC  . lamoTRIgine  200 mg Oral BID  . levofloxacin  500 mg Oral Daily  . lisinopril  40 mg Oral Daily  . loratadine  10 mg Oral Daily  . LORazepam  1 mg Oral Q12H  . magnesium oxide  400 mg Oral Daily  . nitroGLYCERIN  0.5 inch Topical Q6H  . pantoprazole  40 mg Oral Daily  . polyethylene glycol  17 g Oral Daily  . potassium chloride  40 mEq Oral Once  . [START ON 02/24/2013] predniSONE  60 mg Oral Q breakfast  . QUEtiapine  50 mg Oral Custom  . verapamil  120 mg Oral Daily  . vitamin B-12  1,000 mcg Oral Daily   Continuous Infusions:   Principal Problem:   Acute respiratory failure Active Problems:   Hyponatremia   SOB (shortness of breath)   HTN (hypertension)   COPD exacerbation    Time spent: 35  minutes    Jeralyn Bennett  [facility] Pager [phone]. If 7PM-7AM, please contact night-coverage at [email or web address], password [facility] 02/23/2013, 12:49 PM  LOS: 2 days

## 2013-02-23 NOTE — Progress Notes (Signed)
Physical Therapy Treatment Patient Details Name: Cindy Padilla MRN: [id number] DOB: 03/22/1941 Today's Date: 02/23/2013 Time: 4132-4401 PT Time Calculation (min): 27 min  PT Assessment / Plan / Recommendation  History of Present Illness pt is a 84 w.o. female who presents with SOB and chest tightness in the possible setting of COPD exacerbation and acute decompensated CHF.   PT Comments   Pt with increased activity and ambulation distance today. Pt able to perform pericare without assist and ambulated throughout on RA with sats 88-95% as pt unable to recall how long she has been wearing O2. Pt unable to verbalize any energy conservation techniques and educated for such as well as pursed lip breathing. Pt reports several falls this year and will benefit from further therapy to address balance deficits.   Follow Up Recommendations        Does the patient have the potential to tolerate intense rehabilitation     Barriers to Discharge        Equipment Recommendations       Recommendations for Other Services    Frequency     Progress towards PT Goals Progress towards PT goals: Progressing toward goals  Plan Current plan remains appropriate    Precautions / Restrictions Precautions Precautions: Fall Precaution Comments: pt reports at least 3 falls this year Restrictions Weight Bearing Restrictions: No   Pertinent Vitals/Pain No pain HR 68-88   Mobility  Bed Mobility Supine to Sit: 6: Modified independent (Device/Increase time);HOB flat;With rails Transfers Sit to Stand: From bed;From chair/3-in-1;6: Modified independent (Device/Increase time) Stand to Sit: To chair/3-in-1;6: Modified independent (Device/Increase time) Details for Transfer Assistance: su Ambulation/Gait Ambulation/Gait Assistance: 5: Supervision Ambulation Distance (Feet): 400 Feet Assistive device: Rolling walker Ambulation/Gait Assistance Details: cueing for directional cues only Gait Pattern:  Step-through pattern;Decreased stride length Gait velocity: slow and controlled Stairs: No    Exercises     PT Diagnosis:    PT Problem List:   PT Treatment Interventions:     PT Goals (current goals can now be found in the care plan section)    Visit Information  Last PT Received On: 02/23/13 Assistance Needed: +1 History of Present Illness: pt is a 76 w.o. female who presents with SOB and chest tightness in the possible setting of COPD exacerbation and acute decompensated CHF.    Subjective Data      Cognition  Cognition Arousal/Alertness: Awake/alert Behavior During Therapy: WFL for tasks assessed/performed Overall Cognitive Status: History of cognitive impairments - at baseline    Balance     End of Session PT - End of Session Equipment Utilized During Treatment: Gait belt Activity Tolerance: Patient tolerated treatment well Patient left: in chair;with call bell/phone within reach Nurse Communication: Mobility status   GP     Delorse Lek 02/23/2013, 1:40 PM Delaney Meigs, PT [phone]

## 2013-02-23 NOTE — Progress Notes (Signed)
CSW spoke with facility who stated that patient is able to return back to facility when medically ready.  Maree Krabbe, MSW, [facility] [phone]

## 2013-02-24 LAB — CBC
HCT: 34 % — ABNORMAL LOW (ref 36.0–46.0)
Hemoglobin: 11.9 g/dL — ABNORMAL LOW (ref 12.0–15.0)
MCH: 29 pg (ref 26.0–34.0)
MCV: 82.9 fL (ref 78.0–100.0)
RBC: 4.1 MIL/uL (ref 3.87–5.11)
WBC: 11.6 10*3/uL — ABNORMAL HIGH (ref 4.0–10.5)

## 2013-02-24 LAB — GLUCOSE, CAPILLARY

## 2013-02-24 LAB — BASIC METABOLIC PANEL
BUN: 27 mg/dL — ABNORMAL HIGH (ref 6–23)
CO2: 32 mEq/L (ref 19–32)
Calcium: 9.7 mg/dL (ref 8.4–10.5)
Glucose, Bld: 126 mg/dL — ABNORMAL HIGH (ref 70–99)
Potassium: 3.4 mEq/L — ABNORMAL LOW (ref 3.5–5.1)
Sodium: 133 mEq/L — ABNORMAL LOW (ref 135–145)

## 2013-02-24 MED ORDER — FUROSEMIDE 40 MG PO TABS: 40.0000 mg | ORAL_TABLET | Freq: Every day | ORAL | Status: DC

## 2013-02-24 MED ORDER — POTASSIUM CHLORIDE CRYS ER 20 MEQ PO TBCR: 40.0000 meq | EXTENDED_RELEASE_TABLET | Freq: Once | ORAL | Status: DC

## 2013-02-24 MED ORDER — LEVOFLOXACIN 500 MG PO TABS: 500.0000 mg | ORAL_TABLET | Freq: Every day | ORAL | Status: DC

## 2013-02-24 MED ORDER — PREDNISONE 10 MG PO TABS: ORAL_TABLET | ORAL | Status: DC

## 2013-02-24 MED ORDER — ALBUTEROL SULFATE (5 MG/ML) 0.5% IN NEBU: 2.5000 mg | INHALATION_SOLUTION | RESPIRATORY_TRACT | Status: DC | PRN

## 2013-02-24 MED ORDER — POTASSIUM CHLORIDE CRYS ER 20 MEQ PO TBCR
40.0000 meq | EXTENDED_RELEASE_TABLET | Freq: Once | ORAL | Status: AC
  Administered 2013-02-24: 40 meq via ORAL
  Filled 2013-02-24: qty 2

## 2013-02-24 NOTE — Discharge Summary (Signed)
Physician Discharge Summary  Cindy Padilla ZOX:[id number] DOB: [birth year] DOA: 02/21/2013  PCP: Eloisa Northern, MD  Admit date: 02/21/2013 Discharge date: 02/24/2013  Time spent: 35 minutes  Recommendations for Outpatient Follow-up:  Home health PT/OT/RN Bmp within 1 week re: Cr and K and Na Fluid restrict to 1L  Discharge Diagnoses:  Principal Problem:   Acute respiratory failure Active Problems:   Hyponatremia   SOB (shortness of breath)   HTN (hypertension)   COPD exacerbation   Discharge Condition: improved  Diet recommendation: cardiac/diabetic  Filed Weights   02/22/13 0341 02/23/13 0541 02/24/13 0512  Weight: 82.781 kg (182 lb 8 oz) 83.326 kg (183 lb 11.2 oz) 82.691 kg (182 lb 4.8 oz)    History of present illness:  Cindy Padilla is a 72 y.o. female, With history of COPD on 2 L home oxygen, dementia, hypertension, dyslipidemia, Fluid retention, hyponatremia, who is recently admitted to the hospital a few weeks ago for hyponatremia and at that time her high-dose diuretics were held and she was discharged back to assisted living facility, she now presents to the hospital with a one-day history of progressively worsening shortness of breath and orthopnea, she has also noticed massive edema in her legs, in the ER her workup was consistent with Acute respiratory failure caused by COPD exacerbation along with acute on chronic nonspecific CHF,Her initial blood work, EKG and chest x-ray were unremarkable except for elevated proBNP and I was called to admit the patient.   Hospital Course:  Likely acute decompensated congestive heart failure. Patient presenting with clinical signs and symptoms of acute CHF, initial lab work showing an elevated BNP of 1225. Chest x-ray did not reveal obvious infiltrate. Improved on oral lasix   COPD exacerbation. Likely precipitated by acute CHF. Wean off steroids, continue nebs, inhaler   Acute hypoxemic respiratory failure. On 2L O2 at  ALF   Improving Hypertension. continue Coreg, Cardizem and lisinopril therapy- TITRATE as necessary   Bipolar disorder. Stable, continue Seroquel Dyslipidemia. Continue statin therapy   Procedures: Echo Study Conclusions  - Left ventricle: The cavity size was normal. There was moderate concentric hypertrophy. Systolic function was normal. The estimated ejection fraction was in the range of 55% to 60%. Wall motion was normal; there were no regional wall motion abnormalities. Doppler parameters are consistent with abnormal left ventricular relaxation (grade 1 diastolic dysfunction). The E/e' ratio is >10, suggesting elevated LV Filling pressure. - Aortic valve: Trivial regurgitation. - Mitral valve: Mildly thickened leaflets . Trivial regurgitation. - Left atrium: Moderately dilated (34 ml/m2). - Inferior vena cava: The vessel was normal in size; the respirophasic diameter changes were in the normal range (= 50%); findings are consistent with normal central venous pressure. - Pericardium, extracardiac: There was no pericardial effusion.   Consultations:  none  Discharge Exam: Filed Vitals:   02/24/13 0512  BP: 156/67  Pulse: 58  Temp: 98.1 F (36.7 C)  Resp: 20    General: NAD, no increased work of breathing Cardiovascular: rrr Respiratory: clear, no wheezing  Discharge Instructions      Discharge Orders   Future Orders Complete By Expires   (HEART FAILURE PATIENTS) Call MD:  Anytime you have any of the following symptoms: 1) 3 pound weight gain in 24 hours or 5 pounds in 1 week 2) shortness of breath, with or without a dry hacking cough 3) swelling in the hands, feet or stomach 4) if you have to sleep on extra pillows at night in order to breathe.  As directed    Diet - low sodium heart healthy  As directed    Diet Carb Modified  As directed    Discharge instructions  As directed    Comments:     BMP within 1 week re Cr and K Home health PT/OT/RN Resume 2L  of O2   Increase activity slowly  As directed        Medication List    STOP taking these medications       fluticasone 50 MCG/ACT nasal spray  Commonly known as:  FLONASE     MILK OF MAGNESIA PO      TAKE these medications       acetaminophen 500 MG tablet  Commonly known as:  TYLENOL  Take 500 mg by mouth every 4 (four) hours as needed for pain or fever.     albuterol (5 MG/ML) 0.5% nebulizer solution  Commonly known as:  PROVENTIL  Take 0.5 mLs (2.5 mg total) by nebulization every 2 (two) hours as needed for wheezing.     albuterol 90 MCG/ACT inhaler  Commonly known as:  PROVENTIL,VENTOLIN  Inhale 2 puffs into the lungs every 4 (four) hours as needed. Wheezing and shortness of breath     alum & mag hydroxide-simeth 200-200-20 MG/5ML suspension  Commonly known as:  MAALOX/MYLANTA  Take 30 mLs by mouth every 6 (six) hours as needed for indigestion.     antiseptic oral rinse Liqd  15 mLs by Mouth Rinse route at bedtime.     atorvastatin 40 MG tablet  Commonly known as:  LIPITOR  Take 40 mg by mouth daily.     budesonide-formoterol 160-4.5 MCG/ACT inhaler  Commonly known as:  SYMBICORT  Inhale 2 puffs into the lungs 2 (two) times daily.     carvedilol 12.5 MG tablet  Commonly known as:  COREG  Take 12.5 mg by mouth 2 (two) times daily.     cholecalciferol 1000 UNITS tablet  Commonly known as:  VITAMIN D  Take 1,000 Units by mouth daily.     clopidogrel 75 MG tablet  Commonly known as:  PLAVIX  Take 75 mg by mouth daily.     docusate sodium 100 MG capsule  Commonly known as:  COLACE  Take 100 mg by mouth 2 (two) times daily.     donepezil 10 MG tablet  Commonly known as:  ARICEPT  Take 10 mg by mouth every evening.     furosemide 40 MG tablet  Commonly known as:  LASIX  Take 1 tablet (40 mg total) by mouth daily.     guaiFENesin 100 MG/5ML Soln  Commonly known as:  ROBITUSSIN  Take 10 mLs by mouth every 6 (six) hours as needed (cough).      ipratropium-albuterol 0.5-2.5 (3) MG/3ML Soln  Commonly known as:  DUONEB  Take 3 mLs by nebulization 2 (two) times daily.     lamoTRIgine 200 MG tablet  Commonly known as:  LAMICTAL  Take 200 mg by mouth 2 (two) times daily.     levofloxacin 500 MG tablet  Commonly known as:  LEVAQUIN  Take 1 tablet (500 mg total) by mouth daily.     lisinopril 40 MG tablet  Commonly known as:  PRINIVIL,ZESTRIL  Take 40 mg by mouth daily.     loperamide 2 MG tablet  Commonly known as:  IMODIUM A-D  Take 2 mg by mouth as needed for diarrhea or loose stools.     loratadine 10 MG tablet  Commonly known as:  CLARITIN  Take 10 mg by mouth daily.     LORazepam 1 MG tablet  Commonly known as:  ATIVAN  Take 1 mg by mouth every 12 (twelve) hours.     magnesium oxide 400 MG tablet  Commonly known as:  MAG-OX  Take 400 mg by mouth daily.     pantoprazole 40 MG tablet  Commonly known as:  PROTONIX  Take 40 mg by mouth daily.     polyethylene glycol packet  Commonly known as:  MIRALAX / GLYCOLAX  Take 17 g by mouth daily.     potassium chloride SA 20 MEQ tablet  Commonly known as:  K-DUR,KLOR-CON  Take 2 tablets (40 mEq total) by mouth once.     predniSONE 10 MG tablet  Commonly known as:  DELTASONE  50 mg daily x 2 days, 40 mg x 2 days, 30 mg x 2 days, 20 mg x 2 days, 10 mg x 2 days then d/c     QUEtiapine 50 MG tablet  Commonly known as:  SEROQUEL  Take 50 mg by mouth daily.     verapamil 120 MG CR tablet  Commonly known as:  CALAN-SR  Take 120 mg by mouth daily.     vitamin B-12 1000 MCG tablet  Commonly known as:  CYANOCOBALAMIN  Take 1,000 mcg by mouth daily.       Allergies  Allergen Reactions  . Aspirin     unknown  . Codeine     unknown  . Ivp Dye [Iodinated Diagnostic Agents]     unknown  . Morphine And Related     unknown  . Neomycin     unknown  . Penicillins     unknown  . Promethazine     unknown  . Sulfa Antibiotics     unknown  . Tetracyclines &  Related     unknown   Follow-up Information   Follow up with AMIN, SAAD, MD In 1 week.   Specialty:  Internal Medicine   Contact information:   [address] [place] [address] [phone]        The results of significant diagnostics from this hospitalization (including imaging, microbiology, ancillary and laboratory) are listed below for reference.    Significant Diagnostic Studies: Dg Chest 2 View  02/09/2013   CLINICAL DATA:  72 year old female shortness of breath and anxiety.  EXAM: CHEST  2 VIEW  COMPARISON:  01/08/2013 and earlier.  FINDINGS: Semi upright AP and lateral views of the chest. Mildly lower lung volumes. Similar elevation of the right hemidiaphragm. The lungs are clear. No pneumothorax. Stable cardiomegaly and mediastinal contours. Visualized tracheal air column is within normal limits. Left cervical spine stimulator type device re- identified. Calcified atherosclerosis of the aorta. No acute osseous abnormality identified. Right upper quadrant surgical clips.  IMPRESSION: Lower lung volumes, otherwise no acute cardiopulmonary abnormality.   Electronically Signed   By: Augusto Gamble M.D.   On: 02/09/2013 10:33   Dg Chest Port 1 View  02/21/2013   CLINICAL DATA:  Shortness of breath, asthma, COPD, hypertension, diabetes, initial encounter  EXAM: PORTABLE CHEST - 1 VIEW  COMPARISON:  Portable exam 1527 hr compared to 02/09/2013  FINDINGS: Generator pack left chest wall again noted with lead extending into the left cervical region.  Enlargement of cardiac silhouette.  Atherosclerotic calcification aortic arch.  Mediastinal contours and pulmonary vascularity normal.  Bronchitic changes with minimal right basilar atelectasis.  Remaining lungs clear.  No gross pleural effusion or pneumothorax.  Bones demineralized.  IMPRESSION: Enlargement of cardiac silhouette.  Bronchitic changes with minimal right basilar atelectasis.   Electronically Signed   By: Ulyses Southward M.D.   On:  02/21/2013 15:49    Microbiology: No results found for this or any previous visit (from the past 240 hour(s)).   Labs: Basic Metabolic Panel:  Recent Labs Lab 02/21/13 1540 02/22/13 0445 02/23/13 0545 02/24/13 0604  NA 134* 135 137 133*  K 4.6 3.2* 3.3* 3.4*  CL 100 92* 90* 90*  CO2 23 27 31  32  GLUCOSE 118* 156* 188* 126*  BUN 10 18 23  27*  CREATININE 0.85 0.85 0.90 0.96  CALCIUM 8.6 9.3 9.7 9.7   Liver Function Tests: No results found for this basename: AST, ALT, ALKPHOS, BILITOT, PROT, ALBUMIN,  in the last 168 hours No results found for this basename: LIPASE, AMYLASE,  in the last 168 hours No results found for this basename: AMMONIA,  in the last 168 hours CBC:  Recent Labs Lab 02/21/13 1540 02/22/13 0445 02/23/13 0545 02/24/13 0604  WBC 9.3 7.7 15.1* 11.6*  HGB 10.1* 10.9* 12.0 11.9*  HCT 30.5* 31.5* 34.2* 34.0*  MCV 86.4 83.6 82.8 82.9  PLT 208 251 303 295   Cardiac Enzymes: No results found for this basename: CKTOTAL, CKMB, CKMBINDEX, TROPONINI,  in the last 168 hours BNP: BNP (last 3 results)  Recent Labs  10/05/12 2220 02/21/13 1541  PROBNP 384.5* 1225.0*   CBG:  Recent Labs Lab 02/23/13 0611 02/23/13 1120 02/23/13 1611 02/23/13 1931 02/24/13 0623  GLUCAP 179* 155* 132* 168* 122*       Signed:  Anzlee Hinesley  [facility] 02/24/2013, 9:09 AM

## 2013-02-24 NOTE — Progress Notes (Signed)
PT HAS HEMORROIDS CLEANED AND APPLIED VASELINE PER PT REQUEST.

## 2013-02-24 NOTE — Progress Notes (Signed)
PT DISCHARGED VIA EMS. NO S/S OF DISTRESS NO VOICED COMPLAINTS. DISCHARGED INSTRUCTIONS IN ALF ENVELOPE, GIVEN TO EMS.

## 2013-02-24 NOTE — Progress Notes (Addendum)
Clinical Social Worker facilitated patient discharge by contacting the patient, daughter( via phone) and facility [place]. Patient agreeable to this plan and arranging transport via EMS. CSW arranged transportation for 12:30 pm. CSW will sign off, as social work intervention is no longer needed.  Maree Krabbe, MSW, [facility] [phone]

## 2013-03-13 ENCOUNTER — Encounter (HOSPITAL_COMMUNITY): Payer: Self-pay | Admitting: Emergency Medicine

## 2013-03-13 ENCOUNTER — Emergency Department (HOSPITAL_COMMUNITY): Payer: Medicare Other

## 2013-03-13 ENCOUNTER — Emergency Department (HOSPITAL_COMMUNITY)
Admission: EM | Admit: 2013-03-13 | Discharge: 2013-03-14 | Disposition: A | Discharge status: Home / Self Care | Payer: Medicare Other | Attending: Emergency Medicine | Admitting: Emergency Medicine

## 2013-03-13 DIAGNOSIS — G8929 Other chronic pain: Secondary | ICD-10-CM | POA: Insufficient documentation

## 2013-03-13 DIAGNOSIS — M129 Arthropathy, unspecified: Secondary | ICD-10-CM | POA: Insufficient documentation

## 2013-03-13 DIAGNOSIS — K219 Gastro-esophageal reflux disease without esophagitis: Secondary | ICD-10-CM | POA: Insufficient documentation

## 2013-03-13 DIAGNOSIS — I10 Essential (primary) hypertension: Secondary | ICD-10-CM | POA: Insufficient documentation

## 2013-03-13 DIAGNOSIS — E119 Type 2 diabetes mellitus without complications: Secondary | ICD-10-CM | POA: Insufficient documentation

## 2013-03-13 DIAGNOSIS — Z79899 Other long term (current) drug therapy: Secondary | ICD-10-CM | POA: Insufficient documentation

## 2013-03-13 DIAGNOSIS — F0390 Unspecified dementia without behavioral disturbance: Secondary | ICD-10-CM | POA: Insufficient documentation

## 2013-03-13 DIAGNOSIS — M549 Dorsalgia, unspecified: Secondary | ICD-10-CM | POA: Insufficient documentation

## 2013-03-13 DIAGNOSIS — R531 Weakness: Secondary | ICD-10-CM

## 2013-03-13 DIAGNOSIS — R259 Unspecified abnormal involuntary movements: Secondary | ICD-10-CM | POA: Insufficient documentation

## 2013-03-13 DIAGNOSIS — R5381 Other malaise: Secondary | ICD-10-CM | POA: Insufficient documentation

## 2013-03-13 DIAGNOSIS — Z880 Allergy status to penicillin: Secondary | ICD-10-CM | POA: Insufficient documentation

## 2013-03-13 DIAGNOSIS — F319 Bipolar disorder, unspecified: Secondary | ICD-10-CM | POA: Insufficient documentation

## 2013-03-13 DIAGNOSIS — K589 Irritable bowel syndrome without diarrhea: Secondary | ICD-10-CM | POA: Insufficient documentation

## 2013-03-13 DIAGNOSIS — H538 Other visual disturbances: Secondary | ICD-10-CM | POA: Insufficient documentation

## 2013-03-13 LAB — CBC WITH DIFFERENTIAL/PLATELET
Basophils Absolute: 0 10*3/uL (ref 0.0–0.1)
Basophils Relative: 0 % (ref 0–1)
Eosinophils Absolute: 0.1 10*3/uL (ref 0.0–0.7)
Eosinophils Relative: 2 % (ref 0–5)
Hemoglobin: 10.8 g/dL — ABNORMAL LOW (ref 12.0–15.0)
Lymphs Abs: 2.5 10*3/uL (ref 0.7–4.0)
MCH: 28.7 pg (ref 26.0–34.0)
MCHC: 33.6 g/dL (ref 30.0–36.0)
MCV: 85.4 fL (ref 78.0–100.0)
Neutrophils Relative %: 50 % (ref 43–77)
Platelets: 266 10*3/uL (ref 150–400)
RBC: 3.76 MIL/uL — ABNORMAL LOW (ref 3.87–5.11)
RDW: 16.6 % — ABNORMAL HIGH (ref 11.5–15.5)

## 2013-03-13 LAB — COMPREHENSIVE METABOLIC PANEL
ALT: 19 U/L (ref 0–35)
AST: 19 U/L (ref 0–37)
Albumin: 3.3 g/dL — ABNORMAL LOW (ref 3.5–5.2)
Alkaline Phosphatase: 81 U/L (ref 39–117)
Calcium: 9.3 mg/dL (ref 8.4–10.5)
GFR calc Af Amer: >90 mL/min (ref >90)
Glucose, Bld: 102 mg/dL — ABNORMAL HIGH (ref 70–99)
Potassium: 4 mEq/L (ref 3.5–5.1)
Sodium: 134 mEq/L — ABNORMAL LOW (ref 135–145)
Total Bilirubin: 0.3 mg/dL (ref 0.3–1.2)
Total Protein: 6.6 g/dL (ref 6.0–8.3)

## 2013-03-13 LAB — URINE MICROSCOPIC-ADD ON

## 2013-03-13 LAB — URINALYSIS, ROUTINE W REFLEX MICROSCOPIC
Bilirubin Urine: NEGATIVE
Glucose, UA: NEGATIVE mg/dL
Nitrite: NEGATIVE
Specific Gravity, Urine: 1.017 (ref 1.005–1.030)
pH: 6 (ref 5.0–8.0)

## 2013-03-13 LAB — POCT I-STAT TROPONIN I

## 2013-03-13 MED ORDER — QUETIAPINE FUMARATE 50 MG PO TABS
50.0000 mg | ORAL_TABLET | Freq: Every day | ORAL | Status: DC
  Administered 2013-03-13: 50 mg via ORAL
  Filled 2013-03-13: qty 1

## 2013-03-13 MED ORDER — DONEPEZIL HCL 10 MG PO TABS
10.0000 mg | ORAL_TABLET | Freq: Every evening | ORAL | Status: DC
  Administered 2013-03-13: 10 mg via ORAL
  Filled 2013-03-13: qty 1

## 2013-03-13 MED ORDER — BUDESONIDE-FORMOTEROL FUMARATE 160-4.5 MCG/ACT IN AERO
2.0000 | INHALATION_SPRAY | Freq: Two times a day (BID) | RESPIRATORY_TRACT | Status: DC
  Administered 2013-03-13: 2 via RESPIRATORY_TRACT
  Filled 2013-03-13: qty 6

## 2013-03-13 MED ORDER — DOCUSATE SODIUM 100 MG PO CAPS
100.0000 mg | ORAL_CAPSULE | Freq: Two times a day (BID) | ORAL | Status: DC
  Administered 2013-03-13: 100 mg via ORAL
  Filled 2013-03-13: qty 1

## 2013-03-13 MED ORDER — CARVEDILOL 12.5 MG PO TABS
12.5000 mg | ORAL_TABLET | Freq: Two times a day (BID) | ORAL | Status: DC
  Administered 2013-03-13: 12.5 mg via ORAL
  Filled 2013-03-13 (×2): qty 1

## 2013-03-13 MED ORDER — LAMOTRIGINE 200 MG PO TABS
200.0000 mg | ORAL_TABLET | Freq: Two times a day (BID) | ORAL | Status: DC
  Administered 2013-03-13: 200 mg via ORAL
  Filled 2013-03-13: qty 1

## 2013-03-13 MED ORDER — LORAZEPAM 1 MG PO TABS
1.0000 mg | ORAL_TABLET | Freq: Two times a day (BID) | ORAL | Status: DC
  Administered 2013-03-13: 1 mg via ORAL
  Filled 2013-03-13: qty 1

## 2013-03-13 MED ORDER — BIOTENE DRY MOUTH MT LIQD
15.0000 mL | Freq: Every day | OROMUCOSAL | Status: DC
  Administered 2013-03-13: 15 mL via OROMUCOSAL
  Filled 2013-03-13: qty 15

## 2013-03-13 NOTE — ED Notes (Signed)
Patient reports she is not having any pain, but she feels bad from not getting any rest at night in her nursing facility.  Her roommate is allowed to keep the TV on all night.  Patient has complained but staff tells her the roommate has that right.

## 2013-03-13 NOTE — ED Notes (Signed)
Bed: WU98 Expected date: 03/13/13 Expected time: 3:57 PM Means of arrival: Ambulance Comments: Not feeling right

## 2013-03-13 NOTE — ED Notes (Signed)
Patient transported to X-ray 

## 2013-03-13 NOTE — ED Provider Notes (Signed)
Medical screening examination/treatment/procedure(s) were performed by non-physician practitioner and as supervising physician I was immediately available for consultation/collaboration.   Shawna Wearing, MD 03/13/13 2359 

## 2013-03-13 NOTE — ED Notes (Addendum)
Patient has noted bruises to abdomen, different stages of healing. Patient expresses the dislike in the facility she is living in . Staff mean to her. Pt is alert and oriented at this time. Follows commands - appropriate in conversation. Request night time medications.

## 2013-03-13 NOTE — ED Provider Notes (Signed)
CSN: [id number]     Arrival date & time 03/13/13  1603 History   First MD Initiated Contact with Patient 03/13/13 1612     No chief complaint on file.  (Consider location/radiation/quality/duration/timing/severity/associated sxs/prior Treatment) HPI Comments: Patient here with complaints of generalized weakness and "not feeling right".  She states that over the past week she has been complaining to the nursing home staff that she does not feel right, that she has weakness in her arms and legs while walking.  She reports no weakness to one side of her body.  She thinks this may be related to the stressors she is feeling with her room-mate who keeps her TV on all night.  She has complained to the staff but they have done nothing.  She states that she has also noted a tremor (unintentional) to her left hand and foot.  Though she is unsure how long this has been occuring.  She denies headache but reports blurring to her vision, also denies chest pain or shortness of breath, nausea, vomiting, abdominal pain, diarrhea.  She does report bleeding from her hemorrhoids over the past week as well.  Denies any anemia history.    Patient is a 72 y.o. female presenting with extremity weakness. The history is provided by the patient. No language interpreter was used.  Extremity Weakness This is a recurrent problem. The current episode started in the past 7 days. The problem occurs constantly. The problem has been unchanged. Associated symptoms include fatigue, a visual change and weakness. Pertinent negatives include no abdominal pain, anorexia, arthralgias, chest pain, congestion, coughing, diaphoresis, fever, headaches, myalgias, nausea, neck pain, rash, urinary symptoms, vertigo or vomiting. Nothing aggravates the symptoms. She has tried nothing for the symptoms. The treatment provided no relief.    Past Medical History  Diagnosis Date  . Hypertension   . GERD (gastroesophageal reflux disease)   . Dementia    . IBS (irritable bowel syndrome)   . Arthritis   . Constipation   . Hiatal hernia   . Bipolar 1 disorder   . Diabetes mellitus   . Bronchitis   . Chronic back pain    History reviewed. No pertinent past surgical history. No family history on file. History  Substance Use Topics  . Smoking status: Never Smoker   . Smokeless tobacco: Never Used  . Alcohol Use: No   OB History   Grav Para Term Preterm Abortions TAB SAB Ect Mult Living                 Review of Systems  Constitutional: Positive for fatigue. Negative for fever and diaphoresis.  HENT: Negative for congestion.   Respiratory: Negative for cough.   Cardiovascular: Negative for chest pain.  Gastrointestinal: Negative for nausea, vomiting, abdominal pain and anorexia.  Musculoskeletal: Positive for extremity weakness. Negative for arthralgias, myalgias and neck pain.  Skin: Negative for rash.  Neurological: Positive for weakness. Negative for vertigo and headaches.  All other systems reviewed and are negative.    Allergies  Aspirin; Codeine; Ivp dye; Morphine and related; Neomycin; Penicillins; Promethazine; Sulfa antibiotics; and Tetracyclines & related  Home Medications   Current Outpatient Rx  Name  Route  Sig  Dispense  Refill  . acetaminophen (TYLENOL) 500 MG tablet   Oral   Take 500 mg by mouth every 4 (four) hours as needed for pain or fever.         Marland Kitchen alum & mag hydroxide-simeth (MAALOX/MYLANTA) 200-200-20 MG/5ML suspension   Oral  Take 30 mLs by mouth every 6 (six) hours as needed for indigestion.         Marland Kitchen antiseptic oral rinse (BIOTENE) LIQD   Mouth Rinse   15 mLs by Mouth Rinse route at bedtime.         Marland Kitchen atorvastatin (LIPITOR) 40 MG tablet   Oral   Take 40 mg by mouth daily.          . carvedilol (COREG) 12.5 MG tablet   Oral   Take 12.5 mg by mouth 2 (two) times daily.         . cholecalciferol (VITAMIN D) 1000 UNITS tablet   Oral   Take 1,000 Units by mouth daily.          . clopidogrel (PLAVIX) 75 MG tablet   Oral   Take 75 mg by mouth daily.          Marland Kitchen docusate sodium (COLACE) 100 MG capsule   Oral   Take 100 mg by mouth 2 (two) times daily.         Marland Kitchen donepezil (ARICEPT) 10 MG tablet   Oral   Take 10 mg by mouth every evening.          . lamoTRIgine (LAMICTAL) 200 MG tablet   Oral   Take 200 mg by mouth 2 (two) times daily.         Marland Kitchen lisinopril (PRINIVIL,ZESTRIL) 40 MG tablet   Oral   Take 40 mg by mouth daily.          Marland Kitchen loratadine (CLARITIN) 10 MG tablet   Oral   Take 10 mg by mouth daily.          Marland Kitchen LORazepam (ATIVAN) 1 MG tablet   Oral   Take 1 mg by mouth every 12 (twelve) hours.         Marland Kitchen lurasidone (LATUDA) 40 MG TABS tablet   Oral   Take 40 mg by mouth daily with breakfast.         . magnesium oxide (MAG-OX) 400 MG tablet   Oral   Take 400 mg by mouth daily.         Marland Kitchen omeprazole (PRILOSEC) 20 MG capsule   Oral   Take 20 mg by mouth 2 (two) times daily.         . pantoprazole (PROTONIX) 40 MG tablet   Oral   Take 40 mg by mouth daily.          . polyethylene glycol (MIRALAX / GLYCOLAX) packet   Oral   Take 17 g by mouth daily.          . potassium chloride SA (K-DUR,KLOR-CON) 20 MEQ tablet   Oral   Take 2 tablets (40 mEq total) by mouth once.   60 tablet   0   . QUEtiapine (SEROQUEL) 50 MG tablet   Oral   Take 50 mg by mouth daily.         . verapamil (CALAN-SR) 120 MG CR tablet   Oral   Take 120 mg by mouth daily.          . vitamin B-12 (CYANOCOBALAMIN) 1000 MCG tablet   Oral   Take 1,000 mcg by mouth daily.          Marland Kitchen albuterol (PROVENTIL) (5 MG/ML) 0.5% nebulizer solution   Nebulization   Take 0.5 mLs (2.5 mg total) by nebulization every 2 (two) hours as needed for wheezing.   20 mL  12   . albuterol (PROVENTIL,VENTOLIN) 90 MCG/ACT inhaler   Inhalation   Inhale 2 puffs into the lungs every 4 (four) hours as needed. Wheezing and shortness of breath          .  budesonide-formoterol (SYMBICORT) 160-4.5 MCG/ACT inhaler   Inhalation   Inhale 2 puffs into the lungs 2 (two) times daily.         Marland Kitchen loperamide (IMODIUM A-D) 2 MG tablet   Oral   Take 2 mg by mouth as needed for diarrhea or loose stools.          BP 181/82  Pulse 58  Temp(Src) 98.6 F (37 C) (Oral)  Resp 17  SpO2 95% Physical Exam  Nursing note and vitals reviewed. Constitutional: She is oriented to person, place, and time. She appears well-developed and well-nourished. No distress.  HENT:  Head: Normocephalic and atraumatic.  Right Ear: External ear normal.  Left Ear: External ear normal.  Nose: Nose normal.  Mouth/Throat: Oropharynx is clear and moist. No oropharyngeal exudate.  Eyes: Conjunctivae are normal. Pupils are equal, round, and reactive to light. No scleral icterus.  Neck: Normal range of motion. Neck supple.  Cardiovascular: Normal rate, regular rhythm and normal heart sounds.  Exam reveals no gallop and no friction rub.   No murmur heard. Pulmonary/Chest: Effort normal and breath sounds normal. No respiratory distress. She has no wheezes. She has no rales. She exhibits no tenderness.  Abdominal: Soft. Bowel sounds are normal. She exhibits no distension and no mass. There is no tenderness. There is no rebound and no guarding.  Musculoskeletal: Normal range of motion. She exhibits no edema and no tenderness.  Lymphadenopathy:    She has no cervical adenopathy.  Neurological: She is alert and oriented to person, place, and time. She exhibits normal muscle tone. Coordination normal.  Unintentional tremor to left hand and foot  Skin: Skin is warm and dry. No rash noted. No erythema. No pallor.  Psychiatric: She has a normal mood and affect. Her behavior is normal. Judgment and thought content normal.    ED Course  Procedures (including critical care time) Labs Review Labs Reviewed  CBC WITH DIFFERENTIAL - Abnormal; Notable for the following:    RBC 3.76 (*)     Hemoglobin 10.8 (*)    HCT 32.1 (*)    RDW 16.6 (*)    Monocytes Relative 13 (*)    All other components within normal limits  URINALYSIS, ROUTINE W REFLEX MICROSCOPIC - Abnormal; Notable for the following:    Hgb urine dipstick MODERATE (*)    Leukocytes, UA SMALL (*)    All other components within normal limits  URINE MICROSCOPIC-ADD ON  COMPREHENSIVE METABOLIC PANEL  POCT I-STAT TROPONIN I   Imaging Review Dg Chest 2 View  03/13/2013   CLINICAL DATA:  Cough  EXAM: CHEST  2 VIEW  COMPARISON:  02/21/2013  FINDINGS: Low lung volumes. Mild cardiomegaly. No consolidation or mass. No pneumothorax. Stable left generator pack and lead extending into the left neck.  IMPRESSION: Cardiomegaly without decompensation.   Electronically Signed   By: Maryclare Bean M.D.   On: 03/13/2013 18:08    EKG Interpretation   None      Results for orders placed during the hospital encounter of 03/13/13  CBC WITH DIFFERENTIAL      Result Value Range   WBC 7.1  4.0 - 10.5 K/uL   RBC 3.76 (*) 3.87 - 5.11 MIL/uL   Hemoglobin 10.8 (*) 12.0 -  15.0 g/dL   HCT 16.1 (*) 09.6 - 04.5 %   MCV 85.4  78.0 - 100.0 fL   MCH 28.7  26.0 - 34.0 pg   MCHC 33.6  30.0 - 36.0 g/dL   RDW 40.9 (*) 81.1 - 91.4 %   Platelets 266  150 - 400 K/uL   Neutrophils Relative % 50  43 - 77 %   Neutro Abs 3.6  1.7 - 7.7 K/uL   Lymphocytes Relative 35  12 - 46 %   Lymphs Abs 2.5  0.7 - 4.0 K/uL   Monocytes Relative 13 (*) 3 - 12 %   Monocytes Absolute 0.9  0.1 - 1.0 K/uL   Eosinophils Relative 2  0 - 5 %   Eosinophils Absolute 0.1  0.0 - 0.7 K/uL   Basophils Relative 0  0 - 1 %   Basophils Absolute 0.0  0.0 - 0.1 K/uL  COMPREHENSIVE METABOLIC PANEL      Result Value Range   Sodium 134 (*) 135 - 145 mEq/L   Potassium 4.0  3.5 - 5.1 mEq/L   Chloride 99  96 - 112 mEq/L   CO2 26  19 - 32 mEq/L   Glucose, Bld 102 (*) 70 - 99 mg/dL   BUN 10  6 - 23 mg/dL   Creatinine, Ser 7.82  0.50 - 1.10 mg/dL   Calcium 9.3  8.4 - 95.6 mg/dL    Total Protein 6.6  6.0 - 8.3 g/dL   Albumin 3.3 (*) 3.5 - 5.2 g/dL   AST 19  0 - 37 U/L   ALT 19  0 - 35 U/L   Alkaline Phosphatase 81  39 - 117 U/L   Total Bilirubin 0.3  0.3 - 1.2 mg/dL   GFR calc non Af Amer 84 (*) >90 mL/min   GFR calc Af Amer >90  >90 mL/min  URINALYSIS, ROUTINE W REFLEX MICROSCOPIC      Result Value Range   Color, Urine YELLOW  YELLOW   APPearance CLEAR  CLEAR   Specific Gravity, Urine 1.017  1.005 - 1.030   pH 6.0  5.0 - 8.0   Glucose, UA NEGATIVE  NEGATIVE mg/dL   Hgb urine dipstick MODERATE (*) NEGATIVE   Bilirubin Urine NEGATIVE  NEGATIVE   Ketones, ur NEGATIVE  NEGATIVE mg/dL   Protein, ur NEGATIVE  NEGATIVE mg/dL   Urobilinogen, UA 0.2  0.0 - 1.0 mg/dL   Nitrite NEGATIVE  NEGATIVE   Leukocytes, UA SMALL (*) NEGATIVE  URINE MICROSCOPIC-ADD ON      Result Value Range   Squamous Epithelial / LPF RARE  RARE   WBC, UA 0-2  <3 WBC/hpf   RBC / HPF 3-6  <3 RBC/hpf   Bacteria, UA RARE  RARE   Urine-Other FEW YEAST    POCT I-STAT TROPONIN I      Result Value Range   Troponin i, poc 0.01  0.00 - 0.08 ng/mL   Comment 3            Dg Chest 2 View  03/13/2013   CLINICAL DATA:  Cough  EXAM: CHEST  2 VIEW  COMPARISON:  02/21/2013  FINDINGS: Low lung volumes. Mild cardiomegaly. No consolidation or mass. No pneumothorax. Stable left generator pack and lead extending into the left neck.  IMPRESSION: Cardiomegaly without decompensation.   Electronically Signed   By: Maryclare Bean M.D.   On: 03/13/2013 18:08   Dg Chest Port 1 View  02/21/2013   CLINICAL DATA:  Shortness of breath, asthma, COPD, hypertension, diabetes, initial encounter  EXAM: PORTABLE CHEST - 1 VIEW  COMPARISON:  Portable exam 1527 hr compared to 02/09/2013  FINDINGS: Generator pack left chest wall again noted with lead extending into the left cervical region.  Enlargement of cardiac silhouette.  Atherosclerotic calcification aortic arch.  Mediastinal contours and pulmonary vascularity normal.  Bronchitic  changes with minimal right basilar atelectasis.  Remaining lungs clear.  No gross pleural effusion or pneumothorax.  Bones demineralized.  IMPRESSION: Enlargement of cardiac silhouette.  Bronchitic changes with minimal right basilar atelectasis.   Electronically Signed   By: Ulyses Southward M.D.   On: 02/21/2013 15:49     MDM  Generalized weakness  Patient here with generalized weakness without focal findings.  Labs without concerns and I believe this to be more of a social outlet for the patient rather than emergent findings.  Patient is unhappy with her current living situation.  Night time medications given and patient to be discharged home.  It is my understanding that the staff and the patient's daughter are discussing other options.   Izola Price Marisue Humble, PA-C 03/13/13 2228

## 2013-03-13 NOTE — ED Notes (Addendum)
Patient from [facility] memory care unit, c/o balance and visual changes over the last two days.  Patient has history of dementia and bipolar.  Patient had asked staff to call EMS numerous times, then she called herself.  Patient is cooperative, alert and oriented.  EMS reported staff at nursing facility very rude to patient.  Patient hates being there.  Patient is baseline per staff. [id number]

## 2013-03-19 ENCOUNTER — Encounter (HOSPITAL_COMMUNITY): Payer: Self-pay | Admitting: Emergency Medicine

## 2013-03-19 ENCOUNTER — Emergency Department (HOSPITAL_COMMUNITY): Payer: Medicare Other

## 2013-03-19 ENCOUNTER — Emergency Department (HOSPITAL_COMMUNITY)
Admission: EM | Admit: 2013-03-19 | Discharge: 2013-03-19 | Disposition: A | Discharge status: Home / Self Care | Payer: Medicare Other | Attending: Emergency Medicine | Admitting: Emergency Medicine

## 2013-03-19 DIAGNOSIS — J40 Bronchitis, not specified as acute or chronic: Secondary | ICD-10-CM | POA: Insufficient documentation

## 2013-03-19 DIAGNOSIS — E119 Type 2 diabetes mellitus without complications: Secondary | ICD-10-CM | POA: Insufficient documentation

## 2013-03-19 DIAGNOSIS — M545 Low back pain, unspecified: Secondary | ICD-10-CM | POA: Insufficient documentation

## 2013-03-19 DIAGNOSIS — Z79899 Other long term (current) drug therapy: Secondary | ICD-10-CM | POA: Insufficient documentation

## 2013-03-19 DIAGNOSIS — Z8739 Personal history of other diseases of the musculoskeletal system and connective tissue: Secondary | ICD-10-CM | POA: Insufficient documentation

## 2013-03-19 DIAGNOSIS — F319 Bipolar disorder, unspecified: Secondary | ICD-10-CM | POA: Insufficient documentation

## 2013-03-19 DIAGNOSIS — R209 Unspecified disturbances of skin sensation: Secondary | ICD-10-CM | POA: Insufficient documentation

## 2013-03-19 DIAGNOSIS — M79605 Pain in left leg: Secondary | ICD-10-CM

## 2013-03-19 DIAGNOSIS — K5900 Constipation, unspecified: Secondary | ICD-10-CM | POA: Insufficient documentation

## 2013-03-19 DIAGNOSIS — F0390 Unspecified dementia without behavioral disturbance: Secondary | ICD-10-CM | POA: Insufficient documentation

## 2013-03-19 DIAGNOSIS — K219 Gastro-esophageal reflux disease without esophagitis: Secondary | ICD-10-CM | POA: Insufficient documentation

## 2013-03-19 DIAGNOSIS — IMO0001 Reserved for inherently not codable concepts without codable children: Secondary | ICD-10-CM | POA: Insufficient documentation

## 2013-03-19 DIAGNOSIS — IMO0002 Reserved for concepts with insufficient information to code with codable children: Secondary | ICD-10-CM | POA: Insufficient documentation

## 2013-03-19 DIAGNOSIS — G8929 Other chronic pain: Secondary | ICD-10-CM | POA: Insufficient documentation

## 2013-03-19 DIAGNOSIS — M79609 Pain in unspecified limb: Secondary | ICD-10-CM

## 2013-03-19 DIAGNOSIS — R531 Weakness: Secondary | ICD-10-CM

## 2013-03-19 DIAGNOSIS — R269 Unspecified abnormalities of gait and mobility: Secondary | ICD-10-CM | POA: Insufficient documentation

## 2013-03-19 DIAGNOSIS — I509 Heart failure, unspecified: Secondary | ICD-10-CM | POA: Insufficient documentation

## 2013-03-19 DIAGNOSIS — M7989 Other specified soft tissue disorders: Secondary | ICD-10-CM | POA: Insufficient documentation

## 2013-03-19 DIAGNOSIS — R0602 Shortness of breath: Secondary | ICD-10-CM

## 2013-03-19 DIAGNOSIS — Z7902 Long term (current) use of antithrombotics/antiplatelets: Secondary | ICD-10-CM | POA: Insufficient documentation

## 2013-03-19 DIAGNOSIS — I10 Essential (primary) hypertension: Secondary | ICD-10-CM | POA: Insufficient documentation

## 2013-03-19 MED ORDER — HYDROCODONE-ACETAMINOPHEN 5-325 MG PO TABS
1.0000 | ORAL_TABLET | Freq: Once | ORAL | Status: AC
  Administered 2013-03-19: 1 via ORAL
  Filled 2013-03-19: qty 1

## 2013-03-19 MED ORDER — HYDROCODONE-ACETAMINOPHEN 5-325 MG PO TABS: 1.0000 | ORAL_TABLET | Freq: Four times a day (QID) | ORAL | Status: DC | PRN

## 2013-03-19 NOTE — ED Notes (Signed)
Patient transported to CT 

## 2013-03-19 NOTE — ED Notes (Signed)
Pt transported back to facility with [facility].

## 2013-03-19 NOTE — ED Notes (Signed)
Received pt from [facility] with c/o gait problems x couple weeks, since getting out of the hospital. Pt c/o left leg numbness, weakness and pain. Pt also reports back pain and was told she had sciatica. Pt currently going to physical therapy at the facility. EMS called out today because pt was unable to ambulate with her walker to get to breakfast. Pt unsure if pain radiates up to back to down leg.

## 2013-03-19 NOTE — ED Notes (Signed)
Report given to [facility] and [facility] called to transport pt.

## 2013-03-19 NOTE — Progress Notes (Signed)
VASCULAR LAB PRELIMINARY  PRELIMINARY  PRELIMINARY  PRELIMINARY  Left lower extremity venous duplex completed.    Preliminary report:  Left:  No evidence of DVT, superficial thrombosis, or Baker's cyst.  Cindy Padilla, RVS 03/19/2013, 11:39 AM

## 2013-03-19 NOTE — ED Provider Notes (Signed)
Medical screening examination/treatment/procedure(s) were conducted as a shared visit with non-physician practitioner(s) and myself.  I personally evaluated the patient during the encounter.  EKG Interpretation   None       Patient presents with LLE pain and numbness.  Patient reports acute on chronic pain. Patient reports radicular symptoms. Patient has full strength and ROM of the LLE.  No evidence of unilateral swelling or cellulitis.  Neurologic exam reassuring.  CT lumbar spine neg for acute fracture and [place] neg for DVT.  Patient ambulatory.    After history, exam, and medical workup I feel the patient has been appropriately medically screened and is safe for discharge home. Pertinent diagnoses were discussed with the patient. Patient was given return precautions.  Shon Baton, MD 03/19/13 1900

## 2013-03-19 NOTE — ED Notes (Signed)
Patient transported to radiology

## 2013-03-19 NOTE — ED Provider Notes (Signed)
CSN: [id number]     Arrival date & time 03/19/13  0905 History   First MD Initiated Contact with Patient 03/19/13 [phone]     Chief Complaint  Patient presents with  . Leg Pain    Left leg   (Consider location/radiation/quality/duration/timing/severity/associated sxs/prior Treatment) HPI Cindy Padilla is a 72 y.o. female who presents to emergency department with complaint of lower back pain and left leg pain and numbness. Patient's coming from [facility] assisted care memory unit, most of the history provided by her and EMS. Patient states pain and numbness started 4 days ago. States today she was unable to get up or walk. States pain is along the whole leg. States she has been getting progressively weaker since her discharge from the hospital a month ago where she was admitted for CHF. Patient denies any recent falls or injuries. She denies any bowel or bladder control problems. Patient denies any fevers. According to EMS patient has chronic back pain and leg pain and is currently undergoing physical therapy at the facility. Patient states "I did not go to eat breakfast because it was not hungry, I was trying to get attention."  Past Medical History  Diagnosis Date  . Hypertension   . GERD (gastroesophageal reflux disease)   . Dementia   . IBS (irritable bowel syndrome)   . Arthritis   . Constipation   . Hiatal hernia   . Bipolar 1 disorder   . Diabetes mellitus   . Bronchitis   . Chronic back pain    History reviewed. No pertinent past surgical history. No family history on file. History  Substance Use Topics  . Smoking status: Never Smoker   . Smokeless tobacco: Never Used  . Alcohol Use: No   OB History   Grav Para Term Preterm Abortions TAB SAB Ect Mult Living                 Review of Systems  Constitutional: Negative for fever and chills.  Respiratory: Negative for cough, chest tightness and shortness of breath.   Cardiovascular: Positive for leg swelling.  Negative for chest pain and palpitations.  Gastrointestinal: Negative for nausea, vomiting, abdominal pain and diarrhea.  Genitourinary: Negative for dysuria, flank pain, vaginal bleeding, vaginal discharge, vaginal pain and pelvic pain.  Musculoskeletal: Positive for arthralgias, back pain, gait problem and myalgias. Negative for neck pain and neck stiffness.  Skin: Negative for rash.  Neurological: Positive for numbness. Negative for dizziness, weakness and headaches.  All other systems reviewed and are negative.    Allergies  Aspirin; Codeine; Ivp dye; Morphine and related; Neomycin; Penicillins; Promethazine; Sulfa antibiotics; and Tetracyclines & related  Home Medications   Current Outpatient Rx  Name  Route  Sig  Dispense  Refill  . acetaminophen (TYLENOL) 500 MG tablet   Oral   Take 500 mg by mouth every 4 (four) hours as needed for pain or fever.         Marland Kitchen albuterol (PROVENTIL) (5 MG/ML) 0.5% nebulizer solution   Nebulization   Take 0.5 mLs (2.5 mg total) by nebulization every 2 (two) hours as needed for wheezing.   20 mL   12   . albuterol (PROVENTIL,VENTOLIN) 90 MCG/ACT inhaler   Inhalation   Inhale 2 puffs into the lungs every 4 (four) hours as needed. Wheezing and shortness of breath          . alum & mag hydroxide-simeth (MAALOX/MYLANTA) 200-200-20 MG/5ML suspension   Oral   Take 30  mLs by mouth every 6 (six) hours as needed for indigestion.         Marland Kitchen antiseptic oral rinse (BIOTENE) LIQD   Mouth Rinse   15 mLs by Mouth Rinse route at bedtime.         Marland Kitchen atorvastatin (LIPITOR) 40 MG tablet   Oral   Take 40 mg by mouth daily.          . budesonide-formoterol (SYMBICORT) 160-4.5 MCG/ACT inhaler   Inhalation   Inhale 2 puffs into the lungs 2 (two) times daily.         . carvedilol (COREG) 12.5 MG tablet   Oral   Take 12.5 mg by mouth 2 (two) times daily.         . cholecalciferol (VITAMIN D) 1000 UNITS tablet   Oral   Take 1,000 Units by  mouth daily.         . clopidogrel (PLAVIX) 75 MG tablet   Oral   Take 75 mg by mouth daily.          Marland Kitchen docusate sodium (COLACE) 100 MG capsule   Oral   Take 100 mg by mouth 2 (two) times daily.         Marland Kitchen donepezil (ARICEPT) 10 MG tablet   Oral   Take 10 mg by mouth every evening.          . fluticasone (FLONASE) 50 MCG/ACT nasal spray   Nasal   Place 2 sprays into the nose daily.         . furosemide (LASIX) 20 MG tablet   Oral   Take 20 mg by mouth.         . lamoTRIgine (LAMICTAL) 200 MG tablet   Oral   Take 200 mg by mouth 2 (two) times daily.         Marland Kitchen lisinopril (PRINIVIL,ZESTRIL) 40 MG tablet   Oral   Take 40 mg by mouth daily.          Marland Kitchen loperamide (IMODIUM A-D) 2 MG tablet   Oral   Take 2 mg by mouth as needed for diarrhea or loose stools.         Marland Kitchen loratadine (CLARITIN) 10 MG tablet   Oral   Take 10 mg by mouth daily.          Marland Kitchen LORazepam (ATIVAN) 1 MG tablet   Oral   Take 1 mg by mouth every 12 (twelve) hours.         Marland Kitchen lurasidone (LATUDA) 40 MG TABS tablet   Oral   Take 40 mg by mouth daily with breakfast.         . Magnesium Hydroxide (MILK OF MAGNESIA PO)   Oral   Take 30 mLs by mouth at bedtime as needed (for constipation).         . magnesium oxide (MAG-OX) 400 MG tablet   Oral   Take 400 mg by mouth daily.         Marland Kitchen omeprazole (PRILOSEC) 20 MG capsule   Oral   Take 20 mg by mouth 2 (two) times daily.         . pantoprazole (PROTONIX) 40 MG tablet   Oral   Take 40 mg by mouth daily.          . polyethylene glycol (MIRALAX / GLYCOLAX) packet   Oral   Take 17 g by mouth daily.          . potassium chloride SA (  K-DUR,KLOR-CON) 20 MEQ tablet   Oral   Take 2 tablets (40 mEq total) by mouth once.   60 tablet   0   . QUEtiapine (SEROQUEL) 100 MG tablet   Oral   Take 100 mg by mouth at bedtime.         . verapamil (CALAN-SR) 120 MG CR tablet   Oral   Take 120 mg by mouth daily.          .  vitamin B-12 (CYANOCOBALAMIN) 1000 MCG tablet   Oral   Take 1,000 mcg by mouth daily.           BP 184/65  Pulse 62  Temp(Src) 98.3 F (36.8 C) (Oral)  Resp 16  Ht 5' (1.524 m)  Wt 187 lb (84.823 kg)  BMI 36.52 kg/m2  SpO2 97% Physical Exam  Nursing note and vitals reviewed. Constitutional: She is oriented to person, place, and time. She appears well-developed and well-nourished. No distress.  HENT:  Head: Normocephalic.  Eyes: Conjunctivae are normal.  Neck: Neck supple.  Cardiovascular: Normal rate, regular rhythm and normal heart sounds.   Pulmonary/Chest: Effort normal and breath sounds normal. No respiratory distress. She has no wheezes. She has no rales.  Abdominal: Soft. Bowel sounds are normal. She exhibits no distension. There is no tenderness. There is no rebound.  Musculoskeletal:  1+ edema bilaterally. Left leg is tender over entire foot, entire shin and calf, entire thigh. Full range of motion at hip, knee, ankle joints. Dorsal pedal and posterior tibial pulses are intact. Foot and lower legs are normal in color bilaterally. Warm. Midline lumbar spine tenderness. Pain with left straight leg raise.  Neurological: She is alert and oriented to person, place, and time.  5 out of 5 and equal bilateral lower extremity strength with hip flexion, knee extension, foot plantar and dorsiflexion. Decreased sensation over the left dorsal foot  Skin: Skin is warm and dry.  Psychiatric: She has a normal mood and affect. Her behavior is normal.    ED Course  Procedures (including critical care time) Labs Review Labs Reviewed - No data to display   Author: Kerrin Champagne Service: Vascular Lab Author Type: Cardiovascular Sonographer   Filed: 03/19/2013 11:39 AM Note Time: 03/19/2013 11:39 AM         VASCULAR LAB  PRELIMINARY PRELIMINARY PRELIMINARY PRELIMINARY  Left lower extremity venous duplex completed.  Preliminary report: Left: No evidence of DVT, superficial  thrombosis, or Baker's cyst.  SLAUGHTER, [place], RVS  03/19/2013, 11:39 AM    Imaging Review Ct Lumbar Spine Wo Contrast  03/19/2013   CLINICAL DATA:  Left leg numbness, weakness and pain. Gait abnormality.  EXAM: CT LUMBAR SPINE WITHOUT CONTRAST  TECHNIQUE: Multidetector CT imaging of the lumbar spine was performed without intravenous contrast administration. Multiplanar CT image reconstructions were also generated.  COMPARISON:  None.  FINDINGS: There is some exaggeration of the normal lumbar lordosis. Vertebral body height and alignment are normal. Imaged intra-abdominal contents demonstrate cholecystectomy clips. Scattered atherosclerotic vascular disease is also noted.  T12-L1:  Negative.  L2-3:  Negative.  L3-4: Mild facet degenerative disease. No disc bulge or protrusion. The central canal and foramina are open.  L4-5: Bilateral facet arthropathy is present. The patient has a shallow disc bulge. The central canal appears widely patent. Foramina are moderately narrowed.  L5-S1: Facet degenerative disease. Otherwise negative.  IMPRESSION: Negative for fracture or other acute abnormality.  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 Disc  and facet arthropathy cause moderate bilateral foraminal narrowing L4-5.   Electronically Signed   By: Drusilla Kanner M.D.   On: 03/19/2013 12:26    EKG Interpretation   None       MDM   1. SOB (shortness of breath)   2. Weakness   3. Chronic back pain   4. Left leg pain      Patient with chronic back pain and leg pain. Has diagnosis of sciatica. She is currently undergoing physical therapy according to the EMS report for the same pain. Patient has no concerning symptoms suggesting of cauda equina. CT lumbar spine obtained and shows degenerative disease. She is neurovascularly intact. Venous Doppler of left leg obtained and is negative. At this time we'll discharge home for outpatient followup and treatment. Norco  for feedings for pain.    Filed Vitals:   03/19/13 1015 03/19/13 1153 03/19/13 1215 03/19/13 1300  BP: 176/60 164/65 179/65 152/95  Pulse: 60 89 57 51  Temp:      TempSrc:      Resp: 14 18    Height:      Weight:      SpO2: 96% 99% 97% 95%     Josslyn Ciolek A Silas Sedam, PA-C 03/19/13 1626

## 2013-03-19 NOTE — ED Notes (Signed)
Pt still in vascular.

## 2013-04-11 ENCOUNTER — Emergency Department (HOSPITAL_COMMUNITY): Payer: Medicare Other

## 2013-04-11 ENCOUNTER — Encounter (HOSPITAL_COMMUNITY): Payer: Self-pay | Admitting: Emergency Medicine

## 2013-04-11 ENCOUNTER — Emergency Department (HOSPITAL_COMMUNITY)
Admission: EM | Admit: 2013-04-11 | Discharge: 2013-04-11 | Disposition: A | Discharge status: Skilled Nursing Facility | Payer: Medicare Other | Attending: Emergency Medicine | Admitting: Emergency Medicine

## 2013-04-11 DIAGNOSIS — F319 Bipolar disorder, unspecified: Secondary | ICD-10-CM | POA: Insufficient documentation

## 2013-04-11 DIAGNOSIS — F0390 Unspecified dementia without behavioral disturbance: Secondary | ICD-10-CM | POA: Insufficient documentation

## 2013-04-11 DIAGNOSIS — R1084 Generalized abdominal pain: Secondary | ICD-10-CM | POA: Insufficient documentation

## 2013-04-11 DIAGNOSIS — K219 Gastro-esophageal reflux disease without esophagitis: Secondary | ICD-10-CM | POA: Insufficient documentation

## 2013-04-11 DIAGNOSIS — Z7902 Long term (current) use of antithrombotics/antiplatelets: Secondary | ICD-10-CM | POA: Insufficient documentation

## 2013-04-11 DIAGNOSIS — Z880 Allergy status to penicillin: Secondary | ICD-10-CM | POA: Insufficient documentation

## 2013-04-11 DIAGNOSIS — M129 Arthropathy, unspecified: Secondary | ICD-10-CM | POA: Insufficient documentation

## 2013-04-11 DIAGNOSIS — G8929 Other chronic pain: Secondary | ICD-10-CM | POA: Insufficient documentation

## 2013-04-11 DIAGNOSIS — Z8709 Personal history of other diseases of the respiratory system: Secondary | ICD-10-CM | POA: Insufficient documentation

## 2013-04-11 DIAGNOSIS — R5381 Other malaise: Secondary | ICD-10-CM | POA: Insufficient documentation

## 2013-04-11 DIAGNOSIS — R531 Weakness: Secondary | ICD-10-CM

## 2013-04-11 DIAGNOSIS — K089 Disorder of teeth and supporting structures, unspecified: Secondary | ICD-10-CM | POA: Insufficient documentation

## 2013-04-11 DIAGNOSIS — F411 Generalized anxiety disorder: Secondary | ICD-10-CM | POA: Insufficient documentation

## 2013-04-11 DIAGNOSIS — E119 Type 2 diabetes mellitus without complications: Secondary | ICD-10-CM | POA: Insufficient documentation

## 2013-04-11 DIAGNOSIS — R3919 Other difficulties with micturition: Secondary | ICD-10-CM | POA: Insufficient documentation

## 2013-04-11 DIAGNOSIS — I10 Essential (primary) hypertension: Secondary | ICD-10-CM | POA: Insufficient documentation

## 2013-04-11 DIAGNOSIS — Z79899 Other long term (current) drug therapy: Secondary | ICD-10-CM | POA: Insufficient documentation

## 2013-04-11 LAB — URINALYSIS, ROUTINE W REFLEX MICROSCOPIC
Leukocytes, UA: NEGATIVE
Nitrite: NEGATIVE
Protein, ur: NEGATIVE mg/dL
Urobilinogen, UA: 0.2 mg/dL (ref 0.0–1.0)
pH: 7 (ref 5.0–8.0)

## 2013-04-11 LAB — COMPREHENSIVE METABOLIC PANEL
BUN: 13 mg/dL (ref 6–23)
CO2: 30 mEq/L (ref 19–32)
Calcium: 9.6 mg/dL (ref 8.4–10.5)
Chloride: 99 mEq/L (ref 96–112)
Creatinine, Ser: 1.03 mg/dL (ref 0.50–1.10)
GFR calc Af Amer: 61 mL/min — ABNORMAL LOW (ref >90)
GFR calc non Af Amer: 53 mL/min — ABNORMAL LOW (ref >90)
Total Bilirubin: 0.2 mg/dL — ABNORMAL LOW (ref 0.3–1.2)

## 2013-04-11 LAB — CBC
HCT: 36.4 % (ref 36.0–46.0)
MCH: 29 pg (ref 26.0–34.0)
MCV: 86.5 fL (ref 78.0–100.0)
RBC: 4.21 MIL/uL (ref 3.87–5.11)
RDW: 15.8 % — ABNORMAL HIGH (ref 11.5–15.5)
WBC: 7.9 10*3/uL (ref 4.0–10.5)

## 2013-04-11 MED ORDER — DONEPEZIL HCL 10 MG PO TABS
10.0000 mg | ORAL_TABLET | Freq: Every evening | ORAL | Status: DC
  Administered 2013-04-11: 10 mg via ORAL
  Filled 2013-04-11: qty 1

## 2013-04-11 MED ORDER — SODIUM CHLORIDE 0.9 % IV BOLUS (SEPSIS)
1000.0000 mL | Freq: Once | INTRAVENOUS | Status: AC
  Administered 2013-04-11: 1000 mL via INTRAVENOUS

## 2013-04-11 MED ORDER — LORAZEPAM 1 MG PO TABS
1.0000 mg | ORAL_TABLET | Freq: Two times a day (BID) | ORAL | Status: DC
  Administered 2013-04-11: 1 mg via ORAL
  Filled 2013-04-11: qty 1

## 2013-04-11 MED ORDER — ACETAMINOPHEN 325 MG PO TABS
650.0000 mg | ORAL_TABLET | Freq: Once | ORAL | Status: AC
  Administered 2013-04-11: 650 mg via ORAL
  Filled 2013-04-11: qty 2

## 2013-04-11 MED ORDER — BUDESONIDE-FORMOTEROL FUMARATE 160-4.5 MCG/ACT IN AERO
2.0000 | INHALATION_SPRAY | Freq: Two times a day (BID) | RESPIRATORY_TRACT | Status: DC
  Administered 2013-04-11: 2 via RESPIRATORY_TRACT
  Filled 2013-04-11: qty 6

## 2013-04-11 MED ORDER — LAMOTRIGINE 200 MG PO TABS
200.0000 mg | ORAL_TABLET | Freq: Two times a day (BID) | ORAL | Status: DC
  Administered 2013-04-11: 200 mg via ORAL
  Filled 2013-04-11: qty 1

## 2013-04-11 MED ORDER — POTASSIUM CHLORIDE CRYS ER 20 MEQ PO TBCR
40.0000 meq | EXTENDED_RELEASE_TABLET | Freq: Once | ORAL | Status: AC
  Administered 2013-04-11: 40 meq via ORAL
  Filled 2013-04-11: qty 2

## 2013-04-11 MED ORDER — QUETIAPINE FUMARATE 100 MG PO TABS
100.0000 mg | ORAL_TABLET | Freq: Every day | ORAL | Status: DC
  Administered 2013-04-11: 100 mg via ORAL
  Filled 2013-04-11: qty 1

## 2013-04-11 NOTE — ED Notes (Signed)
Bed: WA09 Expected date:  Expected time:  Means of arrival:  Comments: ems- 72 yo

## 2013-04-11 NOTE — ED Provider Notes (Signed)
CSN: [id number]     Arrival date & time 04/11/13  1825 History   First MD Initiated Contact with Patient 04/11/13 1833     Chief Complaint  Patient presents with  . Weakness   (Consider location/radiation/quality/duration/timing/severity/associated sxs/prior Treatment) HPI Comments: Patient is a 72 year old female with a past medical history of hypertension, GERD, dementia, IBS, bipolar 1 disorder, diabetes and chronic back pain who presents to the emergency department from [facility] complaining of generalized weakness x1 day. Patient states over the past few days she has not been eating or sleeping as well as she would like and does not feel her living facility cares about this. States she does not want to eat because she needs to see her dentist regarding her dentures coming loose. Admits to odor to her urine over the past few days. Denies dysuria, hematuria, increased frequency or urgency. She is also complaining of tremors to her left hand and foot which she was previously seen for in the ED, no change. States her living facility "is not ideal" but is her home and she has to deal. She is not happy there, gets anxiety, but "can deal because it is her home". She "constantly has to pick up her roommate from the floor". She feels safe there. Denies fever, chills, chest pain, sob, n/v/d.  Patient is a 72 y.o. female presenting with weakness. The history is provided by the patient.  Weakness Associated symptoms include weakness.    Past Medical History  Diagnosis Date  . Hypertension   . GERD (gastroesophageal reflux disease)   . Dementia   . IBS (irritable bowel syndrome)   . Arthritis   . Constipation   . Hiatal hernia   . Bipolar 1 disorder   . Diabetes mellitus   . Bronchitis   . Chronic back pain    History reviewed. No pertinent past surgical history. History reviewed. No pertinent family history. History  Substance Use Topics  . Smoking status: Never Smoker   . Smokeless  tobacco: Never Used  . Alcohol Use: No   OB History   Grav Para Term Preterm Abortions TAB SAB Ect Mult Living                 Review of Systems  HENT: Positive for dental problem.   Genitourinary:       Positive for odor to urine.  Neurological: Positive for weakness.  Psychiatric/Behavioral: The patient is nervous/anxious.   All other systems reviewed and are negative.    Allergies  Aspirin; Codeine; Ivp dye; Morphine and related; Neomycin; Penicillins; Promethazine; Sulfa antibiotics; and Tetracyclines & related  Home Medications   Current Outpatient Rx  Name  Route  Sig  Dispense  Refill  . acetaminophen (TYLENOL) 500 MG tablet   Oral   Take 500 mg by mouth every 4 (four) hours as needed for pain or fever.         Marland Kitchen albuterol (PROVENTIL) (5 MG/ML) 0.5% nebulizer solution   Nebulization   Take 0.5 mLs (2.5 mg total) by nebulization every 2 (two) hours as needed for wheezing.   20 mL   12   . albuterol (PROVENTIL,VENTOLIN) 90 MCG/ACT inhaler   Inhalation   Inhale 2 puffs into the lungs every 4 (four) hours as needed. Wheezing and shortness of breath          . alum & mag hydroxide-simeth (MAALOX/MYLANTA) 200-200-20 MG/5ML suspension   Oral   Take 30 mLs by mouth every 6 (six) hours  as needed for indigestion.         Marland Kitchen antiseptic oral rinse (BIOTENE) LIQD   Mouth Rinse   15 mLs by Mouth Rinse route at bedtime.         Marland Kitchen atorvastatin (LIPITOR) 40 MG tablet   Oral   Take 40 mg by mouth daily.          . budesonide-formoterol (SYMBICORT) 160-4.5 MCG/ACT inhaler   Inhalation   Inhale 2 puffs into the lungs 2 (two) times daily.         . carvedilol (COREG) 12.5 MG tablet   Oral   Take 12.5 mg by mouth 2 (two) times daily.         . cholecalciferol (VITAMIN D) 1000 UNITS tablet   Oral   Take 1,000 Units by mouth daily.         . clopidogrel (PLAVIX) 75 MG tablet   Oral   Take 75 mg by mouth daily.          Marland Kitchen docusate sodium (COLACE) 100  MG capsule   Oral   Take 100 mg by mouth 2 (two) times daily.         Marland Kitchen donepezil (ARICEPT) 10 MG tablet   Oral   Take 10 mg by mouth every evening.          . fluticasone (FLONASE) 50 MCG/ACT nasal spray   Nasal   Place 2 sprays into the nose daily.         . furosemide (LASIX) 20 MG tablet   Oral   Take 20 mg by mouth.         Marland Kitchen HYDROcodone-acetaminophen (NORCO) 5-325 MG per tablet   Oral   Take 1 tablet by mouth every 6 (six) hours as needed for pain.   15 tablet   0   . lamoTRIgine (LAMICTAL) 200 MG tablet   Oral   Take 200 mg by mouth 2 (two) times daily.         Marland Kitchen lisinopril (PRINIVIL,ZESTRIL) 40 MG tablet   Oral   Take 40 mg by mouth daily.          Marland Kitchen loperamide (IMODIUM A-D) 2 MG tablet   Oral   Take 2 mg by mouth as needed for diarrhea or loose stools.         Marland Kitchen loratadine (CLARITIN) 10 MG tablet   Oral   Take 10 mg by mouth daily.          Marland Kitchen LORazepam (ATIVAN) 1 MG tablet   Oral   Take 1 mg by mouth every 12 (twelve) hours.         Marland Kitchen lurasidone (LATUDA) 40 MG TABS tablet   Oral   Take 40 mg by mouth daily with breakfast.         . Magnesium Hydroxide (MILK OF MAGNESIA PO)   Oral   Take 30 mLs by mouth at bedtime as needed (for constipation).         . magnesium oxide (MAG-OX) 400 MG tablet   Oral   Take 400 mg by mouth daily.         Marland Kitchen omeprazole (PRILOSEC) 20 MG capsule   Oral   Take 20 mg by mouth 2 (two) times daily.         . pantoprazole (PROTONIX) 40 MG tablet   Oral   Take 40 mg by mouth daily.          . polyethylene glycol (MIRALAX /  GLYCOLAX) packet   Oral   Take 17 g by mouth daily.          . potassium chloride SA (K-DUR,KLOR-CON) 20 MEQ tablet   Oral   Take 2 tablets (40 mEq total) by mouth once.   60 tablet   0   . QUEtiapine (SEROQUEL) 100 MG tablet   Oral   Take 100 mg by mouth at bedtime.         . verapamil (CALAN-SR) 120 MG CR tablet   Oral   Take 120 mg by mouth daily.           . vitamin B-12 (CYANOCOBALAMIN) 1000 MCG tablet   Oral   Take 1,000 mcg by mouth daily.           BP 161/43  Pulse 54  Temp(Src) 98.4 F (36.9 C) (Oral)  Resp 20  SpO2 93% Physical Exam  Nursing note and vitals reviewed. Constitutional: She is oriented to person, place, and time. She appears well-developed and well-nourished. No distress.  HENT:  Head: Normocephalic and atraumatic.  Mouth/Throat: Oropharynx is clear and moist.  Dentures in place, no sores underneath.  Eyes: Conjunctivae and EOM are normal. Pupils are equal, round, and reactive to light.  Neck: Normal range of motion. Neck supple.  Cardiovascular: Normal rate, regular rhythm, normal heart sounds and intact distal pulses.   Pulmonary/Chest: Effort normal and breath sounds normal.  Abdominal: Soft. Bowel sounds are normal. There is generalized tenderness (mild). There is no rigidity, no rebound and no guarding.  Musculoskeletal: Normal range of motion. She exhibits no edema.  Neurological: She is alert and oriented to person, place, and time. She has normal strength. No cranial nerve deficit or sensory deficit. Coordination normal. GCS eye subscore is 4. GCS verbal subscore is 5. GCS motor subscore is 6.  Unintentional tremor to left hand and foot.  Skin: Skin is warm and dry. She is not diaphoretic.  Psychiatric: Her behavior is normal.  Flat affect.    ED Course  Procedures (including critical care time) Labs Review Labs Reviewed  GLUCOSE, CAPILLARY - Abnormal; Notable for the following:    Glucose-Capillary 135 (*)    All other components within normal limits  CBC - Abnormal; Notable for the following:    RDW 15.8 (*)    All other components within normal limits  COMPREHENSIVE METABOLIC PANEL - Abnormal; Notable for the following:    Potassium 3.1 (*)    Glucose, Bld 127 (*)    Total Bilirubin 0.2 (*)    GFR calc non Af Amer 53 (*)    GFR calc Af Amer 61 (*)    All other components within normal  limits  URINALYSIS, ROUTINE W REFLEX MICROSCOPIC   Imaging Review Dg Chest 2 View  04/11/2013   CLINICAL DATA:  Weakness, syncopal episodes  EXAM: CHEST  2 VIEW  COMPARISON:  03/03/2013; 02/21/2013  FINDINGS: Grossly unchanged enlarged cardiac silhouette. Normal mediastinal contours. Atherosclerotic plaque within the thoracic aorta. Stable positioning of left anterior chest wall neural stimulator. Unchanged mild elevation of the right hemidiaphragm. No focal airspace opacities. No pleural effusion or pneumothorax. No evidence of edema. No acute osseus abnormalities.  IMPRESSION: Cardiomegaly without cardiopulmonary disease.   Electronically Signed   By: Simonne Come M.D.   On: 04/11/2013 20:30    EKG Interpretation   None       MDM   1. Weakness    Patient with weakness, seen in ED on 10/18 for  same. Not happy at nursing home. She does feel safe. She is well appearing and in NAD, VSS. Cbc, cmp, cbg, ua without acute findings, potassium replaced PO. Tremor is nothing new, her PCP is aware according to patient. Ambulates without difficulty. Reassurance given, patient feels comfortable returning to nursing home. She has dentist appt and PCP appt Wednesday. Return precautions given. Patient states understanding of treatment care plan and is agreeable. Case discussed with attending Dr. Loretha Stapler who also evaluated patient and agrees with plan of care.   Trevor Mace, PA-C 04/11/13 2245

## 2013-04-11 NOTE — ED Notes (Signed)
Per ems pt is from [facility] c/o generalized weakness x1 days, reports over past few days she has not been eating or sleeping as well as she use to.  Pt has many complaints about the facility. Denies pain

## 2013-04-11 NOTE — ED Notes (Signed)
Pt is unable to urinate at this time. 

## 2013-04-11 NOTE — ED Provider Notes (Signed)
Medical screening examination/treatment/procedure(s) were conducted as a shared visit with non-physician practitioner(s) and myself.  I personally evaluated the patient during the encounter.  EKG Interpretation   None       72 yo female presenting with complaint of generalized weakness and dysuria. Well appearing, no distress, normal heart sounds, normal respiratory effort, lungs CTAB, abdomen soft and nontender, generalized weakness without focality.  Bloodwork shows mild hypokalemia, which will be treated with PO K.  Possible cause of her weakness.  Plan UA and ambulate.  Anticipate DC.    Clinical Impression: 1. Weakness       Candyce Churn, MD 04/12/13 [phone]

## 2013-04-12 NOTE — ED Provider Notes (Signed)
Medical screening examination/treatment/procedure(s) were conducted as a shared visit with non-physician practitioner(s) and myself.  I personally evaluated the patient during the encounter.  EKG Interpretation   None         Candyce Churn, MD 04/12/13 [phone]

## 2013-04-15 ENCOUNTER — Institutional Professional Consult (permissible substitution): Payer: Medicare Other | Admitting: Internal Medicine

## 2013-04-16 ENCOUNTER — Institutional Professional Consult (permissible substitution): Payer: Medicare Other | Admitting: Internal Medicine

## 2013-04-16 ENCOUNTER — Encounter: Payer: Self-pay | Admitting: Internal Medicine

## 2013-04-16 ENCOUNTER — Ambulatory Visit (INDEPENDENT_AMBULATORY_CARE_PROVIDER_SITE_OTHER): Payer: Medicare Other | Admitting: Internal Medicine

## 2013-04-16 VITALS — BP 140/84 | HR 66 | Temp 98.4°F | Ht 60.0 in | Wt 175.0 lb

## 2013-04-16 DIAGNOSIS — R0602 Shortness of breath: Secondary | ICD-10-CM

## 2013-04-16 DIAGNOSIS — I10 Essential (primary) hypertension: Secondary | ICD-10-CM

## 2013-04-16 MED ORDER — VALSARTAN 320 MG PO TABS: 320.0000 mg | ORAL_TABLET | Freq: Every day | ORAL | Status: DC

## 2013-04-16 NOTE — Patient Instructions (Signed)
Stop lisinopril Start Diovan 320 mg one daily in its place Continue the symbicort 2 every 12 hours for now   If breathing problems continue stop coreg and use bisoprolol 5 mg in its place  Pulmonary f/u only needs to be arranged if these changes don't correct the problem with your breathing.

## 2013-04-16 NOTE — Progress Notes (Signed)
  Subjective:    Patient ID: Cindy Padilla, female    DOB: 1941/03/06   MRN: [id number]  HPI  63 yowf smoked only as teenager but carries dx asthma/ copd referred 04/16/2013 to pulmonary clinic for eval of sob by EDP   04/16/2013 1st [facility] office visit/ Pakou Rainbow cc daily symbicort and acei and prn duoneb for at least a few years with doe x walking the halls at [facility]. Constant sense of  nose drainage and mostly dry cough. Much more sob with exp to cleaning solutions   No obvious day to day or daytime variabilty or  cp or chest tightness, subjective wheeze overt sinus or hb symptoms. No unusual exp hx or h/o childhood pna/ asthma or knowledge of premature birth.  Sleeping ok without nocturnal  or early am exacerbation  of respiratory  c/o's or need for noct saba. Also denies any obvious fluctuation of symptoms with weather or environmental changes or other aggravating or alleviating factors except as outlined above   Current Medications, Allergies, Complete Past Medical History, Past Surgical History, Family History, and Social History were reviewed in [employer] record.      Review of Systems  Constitutional: Negative for fever and unexpected weight change.  HENT: Positive for congestion. Negative for dental problem, ear pain, nosebleeds, postnasal drip, rhinorrhea, sinus pressure, sneezing, sore throat and trouble swallowing.   Eyes: Negative for redness and itching.  Respiratory: Positive for cough and shortness of breath. Negative for chest tightness and wheezing.   Cardiovascular: Positive for chest pain. Negative for palpitations and leg swelling.  Gastrointestinal: Negative for nausea and vomiting.  Genitourinary: Negative for dysuria.  Musculoskeletal: Positive for joint swelling.  Skin: Negative for rash.  Neurological: Positive for headaches.  Hematological: Does not bruise/bleed easily.  Psychiatric/Behavioral: Negative for dysphoric mood.  The patient is nervous/anxious.        Objective:   Physical Exam  amb elderly wf with resting tremor and abn affect/ pseudowheeze only   Wt Readings from Last 3 Encounters:  04/16/13 175 lb (79.379 kg)  03/19/13 187 lb (84.823 kg)  02/24/13 182 lb 4.8 oz (82.691 kg)      HEENT: nl dentition, turbinates, and orophanx. Nl external ear canals without cough reflex   NECK :  without JVD/Nodes/TM/ nl carotid upstrokes bilaterally   LUNGS: no acc muscle use, clear to A and P bilaterally without cough on insp or exp maneuvers   CV:  RRR  no s3 or murmur or increase in P2, no edema   ABD:  soft and nontender with nl excursion in the supine position. No bruits or organomegaly, bowel sounds nl  MS:  warm without deformities, calf tenderness, cyanosis or clubbing  SKIN: warm and dry without lesions    NEURO:  alert, approp, no deficits    cxr 04/11/13 Cardiomegaly without cardiopulmonary disease.  Spirometry 04/16/2013 shows nl f/v loop     Assessment & Plan:

## 2013-04-17 NOTE — Assessment & Plan Note (Signed)
ACE inhibitors are problematic in  pts with airway complaints because  even experienced pulmonologists can't always distinguish ace effects from copd/asthma.  By themselves they don't actually cause a problem, much like oxygen can't by itself start a fire, but they certainly serve as a powerful catalyst or enhancer for any "fire"  or inflammatory process in the upper airway, be it caused by an ET  tube or more commonly reflux (especially in the obese or pts with known GERD or who are on biphoshonates).    In the era of ARB near equivalency until we have a better handle on the reversibility of the airway problem, it just makes sense to avoid ACEI  entirely in the short run and then decide later, having established a level of airway control using a reasonable limited regimen, whether to add back ace but even then being very careful to observe the pt for worsening airway control and number of meds used/ needed to control symptoms.    For now try diovan 160 mg one daily > this is the only way to tease out what's acei effect vs true asthma

## 2013-04-17 NOTE — Assessment & Plan Note (Signed)
Symptoms are markedly disproportionate to objective findings and not clear this is a lung problem (given nl f/v loop with active "wheeze")  but pt does appear to have difficult airway management issues.   DDX of  difficult airways managment all start with A and  include Adherence, Ace Inhibitors, Acid Reflux, Active Sinus Disease, Alpha 1 Antitripsin deficiency, Anxiety masquerading as Airways dz,  ABPA,  allergy(esp in young), Aspiration (esp in elderly), Adverse effects of DPI,  Active smokers, plus two Bs  = Bronchiectasis and Beta blocker use..and one C= CHF  ACEi leading suspect here > see hbp  ? Acid (or non-acid) GERD > always difficult to exclude as up to 75% of pts in some series report no assoc GI/ Heartburn symptoms> rec max (24h)  acid suppression and diet restrictions/ reviewed and instructions given in writting   ? Beta blocker effect > in absence of clear evidence of true wheeze probably ok to use but note how "B agonist dep" she is  ? CHF > see echo 02/23/13 suggesting at least diast chf supported by cxr's

## 2013-04-18 ENCOUNTER — Emergency Department (HOSPITAL_COMMUNITY): Payer: Medicare Other

## 2013-04-18 ENCOUNTER — Emergency Department (HOSPITAL_COMMUNITY)
Admission: EM | Admit: 2013-04-18 | Discharge: 2013-04-19 | Disposition: A | Discharge status: Skilled Nursing Facility | Payer: Medicare Other | Attending: Emergency Medicine | Admitting: Emergency Medicine

## 2013-04-18 ENCOUNTER — Encounter (HOSPITAL_COMMUNITY): Payer: Self-pay | Admitting: Emergency Medicine

## 2013-04-18 DIAGNOSIS — R45851 Suicidal ideations: Secondary | ICD-10-CM | POA: Insufficient documentation

## 2013-04-18 DIAGNOSIS — F0391 Unspecified dementia with behavioral disturbance: Secondary | ICD-10-CM | POA: Insufficient documentation

## 2013-04-18 DIAGNOSIS — K589 Irritable bowel syndrome without diarrhea: Secondary | ICD-10-CM | POA: Insufficient documentation

## 2013-04-18 DIAGNOSIS — K5900 Constipation, unspecified: Secondary | ICD-10-CM | POA: Insufficient documentation

## 2013-04-18 DIAGNOSIS — Z79899 Other long term (current) drug therapy: Secondary | ICD-10-CM | POA: Insufficient documentation

## 2013-04-18 DIAGNOSIS — R109 Unspecified abdominal pain: Secondary | ICD-10-CM | POA: Insufficient documentation

## 2013-04-18 DIAGNOSIS — Z880 Allergy status to penicillin: Secondary | ICD-10-CM | POA: Insufficient documentation

## 2013-04-18 DIAGNOSIS — M129 Arthropathy, unspecified: Secondary | ICD-10-CM | POA: Insufficient documentation

## 2013-04-18 DIAGNOSIS — Z87891 Personal history of nicotine dependence: Secondary | ICD-10-CM | POA: Insufficient documentation

## 2013-04-18 DIAGNOSIS — I10 Essential (primary) hypertension: Secondary | ICD-10-CM | POA: Insufficient documentation

## 2013-04-18 DIAGNOSIS — R609 Edema, unspecified: Secondary | ICD-10-CM | POA: Insufficient documentation

## 2013-04-18 DIAGNOSIS — F319 Bipolar disorder, unspecified: Secondary | ICD-10-CM

## 2013-04-18 DIAGNOSIS — Z8709 Personal history of other diseases of the respiratory system: Secondary | ICD-10-CM | POA: Insufficient documentation

## 2013-04-18 DIAGNOSIS — K219 Gastro-esophageal reflux disease without esophagitis: Secondary | ICD-10-CM | POA: Insufficient documentation

## 2013-04-18 DIAGNOSIS — G479 Sleep disorder, unspecified: Secondary | ICD-10-CM | POA: Insufficient documentation

## 2013-04-18 DIAGNOSIS — IMO0002 Reserved for concepts with insufficient information to code with codable children: Secondary | ICD-10-CM | POA: Insufficient documentation

## 2013-04-18 DIAGNOSIS — R5381 Other malaise: Secondary | ICD-10-CM | POA: Insufficient documentation

## 2013-04-18 DIAGNOSIS — Z7902 Long term (current) use of antithrombotics/antiplatelets: Secondary | ICD-10-CM | POA: Insufficient documentation

## 2013-04-18 DIAGNOSIS — F314 Bipolar disorder, current episode depressed, severe, without psychotic features: Secondary | ICD-10-CM | POA: Insufficient documentation

## 2013-04-18 DIAGNOSIS — F329 Major depressive disorder, single episode, unspecified: Secondary | ICD-10-CM

## 2013-04-18 DIAGNOSIS — Z90710 Acquired absence of both cervix and uterus: Secondary | ICD-10-CM | POA: Insufficient documentation

## 2013-04-18 DIAGNOSIS — M549 Dorsalgia, unspecified: Secondary | ICD-10-CM | POA: Insufficient documentation

## 2013-04-18 DIAGNOSIS — G8929 Other chronic pain: Secondary | ICD-10-CM | POA: Insufficient documentation

## 2013-04-18 DIAGNOSIS — F322 Major depressive disorder, single episode, severe without psychotic features: Secondary | ICD-10-CM

## 2013-04-18 DIAGNOSIS — E119 Type 2 diabetes mellitus without complications: Secondary | ICD-10-CM | POA: Insufficient documentation

## 2013-04-18 DIAGNOSIS — F03918 Unspecified dementia, unspecified severity, with other behavioral disturbance: Secondary | ICD-10-CM | POA: Insufficient documentation

## 2013-04-18 DIAGNOSIS — Z9889 Other specified postprocedural states: Secondary | ICD-10-CM | POA: Insufficient documentation

## 2013-04-18 LAB — CBC WITH DIFFERENTIAL/PLATELET
Basophils Absolute: 0 10*3/uL (ref 0.0–0.1)
Basophils Relative: 0 % (ref 0–1)
Eosinophils Absolute: 0.3 10*3/uL (ref 0.0–0.7)
Eosinophils Relative: 4 % (ref 0–5)
Hemoglobin: 12.8 g/dL (ref 12.0–15.0)
MCH: 29.1 pg (ref 26.0–34.0)
MCHC: 33.6 g/dL (ref 30.0–36.0)
MCV: 86.6 fL (ref 78.0–100.0)
Monocytes Relative: 12 % (ref 3–12)
Neutrophils Relative %: 43 % (ref 43–77)
Platelets: 277 10*3/uL (ref 150–400)

## 2013-04-18 LAB — COMPREHENSIVE METABOLIC PANEL
ALT: 22 U/L (ref 0–35)
Albumin: 3.9 g/dL (ref 3.5–5.2)
Alkaline Phosphatase: 103 U/L (ref 39–117)
BUN: 12 mg/dL (ref 6–23)
Calcium: 10 mg/dL (ref 8.4–10.5)
Creatinine, Ser: 0.9 mg/dL (ref 0.50–1.10)
GFR calc Af Amer: 72 mL/min — ABNORMAL LOW (ref >90)
GFR calc non Af Amer: 62 mL/min — ABNORMAL LOW (ref >90)
Potassium: 3.1 mEq/L — ABNORMAL LOW (ref 3.5–5.1)
Total Protein: 7.4 g/dL (ref 6.0–8.3)

## 2013-04-18 LAB — RAPID URINE DRUG SCREEN, HOSP PERFORMED
Amphetamines: NOT DETECTED
Barbiturates: NOT DETECTED

## 2013-04-18 LAB — URINALYSIS, ROUTINE W REFLEX MICROSCOPIC
Bilirubin Urine: NEGATIVE
Glucose, UA: NEGATIVE mg/dL
Hgb urine dipstick: NEGATIVE
Ketones, ur: NEGATIVE mg/dL
Nitrite: NEGATIVE
Protein, ur: NEGATIVE mg/dL
Specific Gravity, Urine: 1.027 (ref 1.005–1.030)

## 2013-04-18 LAB — SALICYLATE LEVEL: Salicylate Lvl: <2 mg/dL — ABNORMAL LOW (ref 2.8–20.0)

## 2013-04-18 LAB — URINE MICROSCOPIC-ADD ON

## 2013-04-18 LAB — TROPONIN I
Troponin I: <0.3 ng/mL (ref <0.30)
Troponin I: <0.3 ng/mL (ref <0.30)

## 2013-04-18 LAB — LIPASE, BLOOD: Lipase: 85 U/L — ABNORMAL HIGH (ref 11–59)

## 2013-04-18 LAB — CG4 I-STAT (LACTIC ACID): Lactic Acid, Venous: 1.84 mmol/L (ref 0.5–2.2)

## 2013-04-18 LAB — ACETAMINOPHEN LEVEL: Acetaminophen (Tylenol), Serum: <15 ug/mL (ref 10–30)

## 2013-04-18 MED ORDER — LISINOPRIL 20 MG PO TABS
40.0000 mg | ORAL_TABLET | Freq: Every morning | ORAL | Status: DC
  Administered 2013-04-19: 40 mg via ORAL
  Filled 2013-04-18: qty 2

## 2013-04-18 MED ORDER — FLUTICASONE PROPIONATE 50 MCG/ACT NA SUSP
2.0000 | Freq: Every morning | NASAL | Status: DC
  Administered 2013-04-19: 2 via NASAL
  Filled 2013-04-18: qty 16

## 2013-04-18 MED ORDER — VITAMIN B-12 1000 MCG PO TABS
1000.0000 ug | ORAL_TABLET | Freq: Every morning | ORAL | Status: DC
  Administered 2013-04-19: 1000 ug via ORAL
  Filled 2013-04-18: qty 1

## 2013-04-18 MED ORDER — CARVEDILOL 12.5 MG PO TABS
12.5000 mg | ORAL_TABLET | Freq: Two times a day (BID) | ORAL | Status: DC
  Administered 2013-04-18 – 2013-04-19 (×2): 12.5 mg via ORAL
  Filled 2013-04-18 (×4): qty 1

## 2013-04-18 MED ORDER — VERAPAMIL HCL ER 120 MG PO TBCR
120.0000 mg | EXTENDED_RELEASE_TABLET | Freq: Every morning | ORAL | Status: DC
  Administered 2013-04-19: 120 mg via ORAL
  Filled 2013-04-18: qty 1

## 2013-04-18 MED ORDER — BIOTENE DRY MOUTH MT LIQD
15.0000 mL | Freq: Every day | OROMUCOSAL | Status: DC
  Administered 2013-04-18: 15 mL via OROMUCOSAL

## 2013-04-18 MED ORDER — ALBUTEROL SULFATE HFA 108 (90 BASE) MCG/ACT IN AERS
2.0000 | INHALATION_SPRAY | RESPIRATORY_TRACT | Status: DC | PRN
  Filled 2013-04-18: qty 6.7

## 2013-04-18 MED ORDER — CLOPIDOGREL BISULFATE 75 MG PO TABS
75.0000 mg | ORAL_TABLET | Freq: Every day | ORAL | Status: DC
Start: 2013-04-19 — End: 2013-04-19
  Administered 2013-04-19: 75 mg via ORAL
  Filled 2013-04-18 (×2): qty 1

## 2013-04-18 MED ORDER — POTASSIUM CHLORIDE CRYS ER 20 MEQ PO TBCR
40.0000 meq | EXTENDED_RELEASE_TABLET | Freq: Every morning | ORAL | Status: DC
Start: 2013-04-19 — End: 2013-04-19
  Administered 2013-04-19: 40 meq via ORAL
  Filled 2013-04-18: qty 2

## 2013-04-18 MED ORDER — IRBESARTAN 300 MG PO TABS
300.0000 mg | ORAL_TABLET | Freq: Every day | ORAL | Status: DC
  Administered 2013-04-19: 300 mg via ORAL
  Filled 2013-04-18: qty 1

## 2013-04-18 MED ORDER — LAMOTRIGINE 200 MG PO TABS
200.0000 mg | ORAL_TABLET | Freq: Two times a day (BID) | ORAL | Status: DC
  Administered 2013-04-18 – 2013-04-19 (×2): 200 mg via ORAL
  Filled 2013-04-18 (×3): qty 1

## 2013-04-18 MED ORDER — LORAZEPAM 1 MG PO TABS
1.0000 mg | ORAL_TABLET | Freq: Two times a day (BID) | ORAL | Status: DC
  Administered 2013-04-18 – 2013-04-19 (×2): 1 mg via ORAL
  Filled 2013-04-18 (×2): qty 1

## 2013-04-18 MED ORDER — FUROSEMIDE 20 MG PO TABS
20.0000 mg | ORAL_TABLET | Freq: Every morning | ORAL | Status: DC
  Administered 2013-04-19: 20 mg via ORAL
  Filled 2013-04-18: qty 1

## 2013-04-18 MED ORDER — NITROFURANTOIN MONOHYD MACRO 100 MG PO CAPS
100.0000 mg | ORAL_CAPSULE | Freq: Once | ORAL | Status: AC
  Administered 2013-04-18: 100 mg via ORAL
  Filled 2013-04-18: qty 1

## 2013-04-18 MED ORDER — SENNOSIDES-DOCUSATE SODIUM 8.6-50 MG PO TABS
1.0000 | ORAL_TABLET | Freq: Two times a day (BID) | ORAL | Status: DC
  Administered 2013-04-18 – 2013-04-19 (×2): 1 via ORAL
  Filled 2013-04-18 (×2): qty 1

## 2013-04-18 MED ORDER — POTASSIUM CHLORIDE CRYS ER 20 MEQ PO TBCR
40.0000 meq | EXTENDED_RELEASE_TABLET | Freq: Once | ORAL | Status: AC
  Administered 2013-04-18: 40 meq via ORAL
  Filled 2013-04-18: qty 2

## 2013-04-18 MED ORDER — VITAMIN D3 25 MCG (1000 UNIT) PO TABS
1000.0000 [IU] | ORAL_TABLET | Freq: Every morning | ORAL | Status: DC
Start: 2013-04-19 — End: 2013-04-19
  Administered 2013-04-19: 1000 [IU] via ORAL
  Filled 2013-04-18: qty 1

## 2013-04-18 MED ORDER — PANTOPRAZOLE SODIUM 40 MG PO TBEC
40.0000 mg | DELAYED_RELEASE_TABLET | Freq: Every morning | ORAL | Status: DC
  Administered 2013-04-19: 40 mg via ORAL
  Filled 2013-04-18: qty 1

## 2013-04-18 MED ORDER — DONEPEZIL HCL 10 MG PO TABS
10.0000 mg | ORAL_TABLET | Freq: Every day | ORAL | Status: DC
  Administered 2013-04-18: 10 mg via ORAL
  Filled 2013-04-18 (×2): qty 1

## 2013-04-18 MED ORDER — QUETIAPINE FUMARATE 100 MG PO TABS
100.0000 mg | ORAL_TABLET | Freq: Every day | ORAL | Status: DC
  Administered 2013-04-18: 100 mg via ORAL
  Filled 2013-04-18: qty 1

## 2013-04-18 MED ORDER — MAGNESIUM OXIDE 400 (241.3 MG) MG PO TABS
400.0000 mg | ORAL_TABLET | Freq: Every morning | ORAL | Status: DC
  Administered 2013-04-19: 400 mg via ORAL
  Filled 2013-04-18: qty 1

## 2013-04-18 MED ORDER — MAGNESIUM HYDROXIDE 400 MG/5ML PO SUSP: 30.0000 mL | Freq: Every evening | ORAL | Status: DC | PRN

## 2013-04-18 MED ORDER — BUDESONIDE-FORMOTEROL FUMARATE 160-4.5 MCG/ACT IN AERO
2.0000 | INHALATION_SPRAY | Freq: Two times a day (BID) | RESPIRATORY_TRACT | Status: DC
  Administered 2013-04-18 – 2013-04-19 (×2): 2 via RESPIRATORY_TRACT
  Filled 2013-04-18: qty 6

## 2013-04-18 MED ORDER — ATORVASTATIN CALCIUM 40 MG PO TABS
40.0000 mg | ORAL_TABLET | Freq: Every morning | ORAL | Status: DC
  Administered 2013-04-19: 40 mg via ORAL
  Filled 2013-04-18: qty 1

## 2013-04-18 MED ORDER — ALUM & MAG HYDROXIDE-SIMETH 200-200-20 MG/5ML PO SUSP
30.0000 mL | Freq: Four times a day (QID) | ORAL | Status: DC | PRN
  Filled 2013-04-18: qty 30

## 2013-04-18 MED ORDER — LORATADINE 10 MG PO TABS
10.0000 mg | ORAL_TABLET | Freq: Every morning | ORAL | Status: DC
  Administered 2013-04-19: 10 mg via ORAL
  Filled 2013-04-18: qty 1

## 2013-04-18 NOTE — ED Notes (Signed)
Pt states that she is aggrivated, and has had a decreased appetite and that she is Bi-polar and depressed. Pt states that she 'just doesn't want to be here anymore' and if she needs to leave the world then so be it.
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
## 2013-04-19 DIAGNOSIS — F322 Major depressive disorder, single episode, severe without psychotic features: Secondary | ICD-10-CM | POA: Diagnosis not present

## 2013-04-19 DIAGNOSIS — F319 Bipolar disorder, unspecified: Secondary | ICD-10-CM

## 2013-04-19 NOTE — ED Notes (Signed)
Psychiatrist came in to speak with pt and stated they would come back later since pt was waiting for her ativan.

## 2013-04-19 NOTE — ED Notes (Addendum)
Pt ambulated to room with one assist. No complaints at present.

## 2013-04-19 NOTE — ED Provider Notes (Signed)
Medical screening examination/treatment/procedure(s) were conducted as a shared visit with non-physician practitioner(s) and myself.  I personally evaluated the patient during the encounter.  EKG Interpretation    Date/Time:  Sunday April 18 2013 19:46:24 EST Ventricular Rate:  55 PR Interval:  167 QRS Duration: 95 QT Interval:  474 QTC Calculation: 453 R Axis:   5 Text Interpretation:  Sinus rhythm No significant change since last tracing Confirmed by [facility]  MD, Hesper Venturella ([id number]) on 04/18/2013 8:00:53 PM           Pt chronically angry at ALF staff, no SI plan but chronic vague SI. Denies HI/hallucinations.  Hurman Horn, MD 04/21/13 [phone]

## 2013-04-19 NOTE — ED Notes (Signed)
Per CSW pt can return to ALF. Called report to [facility].

## 2013-04-19 NOTE — BH Assessment (Signed)
Assessment complete. Consulted with Alberteen Sam, NP who agrees Pt could benefit from inpatient treatment at a geriatric psych unit. Spoke with Antony Madura, PA-C who agrees with disposition. TTS will contact [facility] and other facilties for placement.  Harlin Rain Ria Comment, [facility] Triage Specialist

## 2013-04-19 NOTE — ED Provider Notes (Signed)
Medical screening examination/treatment/procedure(s) were performed by non-physician practitioner and as supervising physician I was immediately available for consultation/collaboration.  EKG Interpretation    Date/Time:  Sunday April 18 2013 19:46:24 EST Ventricular Rate:  55 PR Interval:  167 QRS Duration: 95 QT Interval:  474 QTC Calculation: 453 R Axis:   5 Text Interpretation:  Sinus rhythm No significant change since last tracing Confirmed by [facility]  MD, Jonny Ruiz [phone]) on 04/18/2013 8:00:53 PM              Celene Kras, MD 04/19/13 [phone]

## 2013-04-19 NOTE — Progress Notes (Signed)
Per discussion with psychiatrist and np, patient psychiatrically stable to return to [facility] ALF. CSW spoke with Crystal at [facility] who agreed patient can reutrn to ALF. RN to call report to [phone] ans ask for Crystal. Patient will be followed by Psych on Site.   Catha Gosselin, LCSW [phone]  ED CSW .04/19/2013 1224pm

## 2013-04-19 NOTE — BH Assessment (Signed)
Received call for tele-assessment. Spoke with Antony Madura, PA-C who said Pt has been unhappy in her assisted living and more depressed and agitated. Tele-assessment will be initiated.  Harlin Rain Ria Comment, [facility] Triage Specialist

## 2013-04-19 NOTE — Consult Note (Signed)
Pt was interviewed with NP Josephine. Chart reviewed. Agree with assessment and plan. Pt denies SI/HI/AVH. Pt reports feeling better today, since she slept well last night. Will d/c today to ALF.
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
## 2013-04-19 NOTE — Progress Notes (Signed)
   CARE MANAGEMENT ED NOTE 04/19/2013  Patient:  Cindy Padilla, Cindy Padilla   Account Number:  [id number]  Date Initiated:  04/19/2013  Documentation initiated by:  Edd Arbour  Subjective/Objective Assessment:   72 yr old female medicare with elveated temp pf 100.1 orally on 04/18/13 temp precheck per CM request K 3.1 Lipase 85 bp 80/48 with increase to 115/75 pcp Amin     Subjective/Objective Assessment Detail:   RN report BP noted only to be down when taken on one arm versus the other arm BP medications-coreg- held for low blood pressures and     Action/Plan:   CM spoke with RN to ask about VS updates, pt behavior and BP decreases Cm spoike with ED SW about pt   Action/Plan Detail:   Anticipated DC Date:       Status Recommendation to Physician:   Result of Recommendation:    Other ED Services  Consult Working Plan    DC [employer]  Other    Choice offered to / List presented to:            Status of service:  Completed, signed off  ED Comments:   ED Comments Detail:
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
## 2013-04-19 NOTE — ED Provider Notes (Signed)
Psychiatrist has evaluated pt and deems her stable for d/c back to ALF. ALF agrees pt can return. It was requested EDP d/c pt.   Laray Anger, DO 04/19/13 1234

## 2013-04-19 NOTE — ED Provider Notes (Signed)
Patient care assumed from [facility], PA-C at shift change. 2nd troponin pending. Plan to consult TTS for medical clearance if negative.  Serial troponin negative. Patient cleared for TTS eval. Consult placed.  Ford from [facility] and psychiatric nurse practitioner have assessed patient and believe she would benefit from geriatric psychiatric unit. ACT team to call for placement which is pending at this time.  Antony Madura, PA-C 04/19/13 [phone]

## 2013-04-19 NOTE — Progress Notes (Signed)
CSW spoke with Nicholos Johns at [facility], referral received and pending review.  CSW spoke with Shanda Bumps at [facility], referral received and pending review.  CSW spoke with Annice Pih at [facility],  No beds available or anticipated today.  CSW referred to [facility], pending review.   Catha Gosselin, LCSW [phone]  ED CSW .04/19/2013 1005am

## 2013-04-19 NOTE — Progress Notes (Signed)
Underwriter initiated bed placement for inpatient psychiatric treatment on behalf of pt.  The following hospitals were faxed referrals based on bed availability: 1)[facility] 2)[facility] 3)[facility] 4)[facility] 5)[facility]

## 2013-04-19 NOTE — ED Notes (Addendum)
Pt noted to be hypotensive. Pt awakens readily to verbal stimuli, answers questions appropriately, denies pain. Repeat BP after pt awake 106/50

## 2013-07-01 ENCOUNTER — Encounter: Payer: Self-pay | Admitting: Neurology

## 2013-07-01 ENCOUNTER — Ambulatory Visit (INDEPENDENT_AMBULATORY_CARE_PROVIDER_SITE_OTHER): Payer: Medicare Other | Admitting: Neurology

## 2013-07-01 VITALS — BP 155/69 | HR 57 | Ht 60.0 in | Wt 176.0 lb

## 2013-07-01 DIAGNOSIS — R531 Weakness: Secondary | ICD-10-CM

## 2013-07-01 DIAGNOSIS — R5383 Other fatigue: Secondary | ICD-10-CM

## 2013-07-01 DIAGNOSIS — R5381 Other malaise: Secondary | ICD-10-CM

## 2013-07-01 DIAGNOSIS — I10 Essential (primary) hypertension: Secondary | ICD-10-CM

## 2013-07-01 DIAGNOSIS — J441 Chronic obstructive pulmonary disease with (acute) exacerbation: Secondary | ICD-10-CM

## 2013-07-01 NOTE — Progress Notes (Signed)
PATIENT: Cindy Padilla DOB: 1941-05-20  HISTORICAL  Cindy Padilla is  a 73 years old right-handed Caucasian female, referred by PA Isla Pence at her assisted living for evaluation of tremor, she is accompanied by staff, who has known her for 2 years  She has long standing history of bipolar disorder, was treated with different medications in the past, but she could not recall the name of the medications, for a while, she was receiving shot treatment [facility], she was also treated with ECT for severe depression in the past  Few months ago, she was put it on Latuda, which is an antipsychotic agent that acts as an antagonist at dopamine type-2 (D2) and 5-hydroxytryptamine (5-HT)2A receptors. It also has moderate antagonistic activity at alpha-2C and alpha-2A adrenergic receptors and is a partial agonist at 5-HT1A receptors  She reported significant side effect, "makes me like a zombie", she has increased her bilateral hands tremor, abnormal mouth movement, to the point that she could not holding her folks to feed herself,  It was stopped few months ago, she is now much improved, at baseline, she has mild gait difficulty, using walkers, because of low back pain, bilateral feet pain, her hand tremor has much improved, she has no problem eating now  She denied loss sense of smell, her mood is overall stable on the current polypharmacy including benztropine 1 mg twice a day, Anafranil 10 mg a day, lamotrigine 200 mg twice a day, lorazepam 1 mg every 12 hours, quetiapine 100 mg twice a day,     Review OF SYSTEMS: Full 14 system review of systems performed and notable only for  gait difficulty   ALLERGIES: Allergies  Allergen Reactions  . Aspirin     unknown  . Codeine     unknown  . Ivp Dye [Iodinated Diagnostic Agents]     unknown  . Morphine And Related     unknown  . Neomycin     unknown  . Penicillins     unknown  . Promethazine     unknown  . Sulfa  Antibiotics     unknown  . Tetracyclines & Related     unknown    HOME MEDICATIONS: Outpatient Prescriptions Prior to Visit  Medication Sig Dispense Refill  . acetaminophen (TYLENOL) 500 MG tablet Take 500 mg by mouth every 4 (four) hours as needed for pain or fever.      Marland Kitchen albuterol (PROVENTIL HFA;VENTOLIN HFA) 108 (90 BASE) MCG/ACT inhaler Inhale 2 puffs into the lungs every 4 (four) hours as needed for wheezing or shortness of breath.      Marland Kitchen albuterol (PROVENTIL) (2.5 MG/3ML) 0.083% nebulizer solution Take 2.5 mg by nebulization every 2 (two) hours as needed for wheezing or shortness of breath.      Marland Kitchen alum & mag hydroxide-simeth (GERI-LANTA) 200-200-20 MG/5ML suspension Take 30 mLs by mouth every 6 (six) hours as needed for indigestion or heartburn.      Marland Kitchen antiseptic oral rinse (BIOTENE) LIQD 15 mLs by Mouth Rinse route at bedtime.      Marland Kitchen atorvastatin (LIPITOR) 40 MG tablet Take 40 mg by mouth every morning.       . budesonide-formoterol (SYMBICORT) 160-4.5 MCG/ACT inhaler Inhale 2 puffs into the lungs 2 (two) times daily.      . carvedilol (COREG) 12.5 MG tablet Take 12.5 mg by mouth 2 (two) times daily.      . cholecalciferol (VITAMIN D) 1000 UNITS tablet Take 1,000 Units by  mouth every morning.       . clopidogrel (PLAVIX) 75 MG tablet Take 75 mg by mouth every morning.       . donepezil (ARICEPT) 10 MG tablet Take 10 mg by mouth at bedtime.       . fluticasone (FLONASE) 50 MCG/ACT nasal spray Place 2 sprays into the nose every morning.       . furosemide (LASIX) 20 MG tablet Take 20 mg by mouth every morning.       Marland Kitchen. guaifenesin (ROBITUSSIN) 100 MG/5ML syrup Take 200 mg by mouth 3 (three) times daily as needed for cough.      Marland Kitchen. HYDROcodone-acetaminophen (NORCO/VICODIN) 5-325 MG per tablet Take 1 tablet by mouth every 6 (six) hours as needed for moderate pain.      Marland Kitchen. lamoTRIgine (LAMICTAL) 200 MG tablet Take 200 mg by mouth 2 (two) times daily.      Marland Kitchen. lisinopril (PRINIVIL,ZESTRIL) 40  MG tablet Take 40 mg by mouth every morning.      . loperamide (IMODIUM) 2 MG capsule Take 2 mg by mouth as needed for diarrhea or loose stools.      Marland Kitchen. loratadine (CLARITIN) 10 MG tablet Take 10 mg by mouth every morning.       Marland Kitchen. LORazepam (ATIVAN) 1 MG tablet Take 1 mg by mouth every 12 (twelve) hours.      . magnesium hydroxide (MILK OF MAGNESIA) 400 MG/5ML suspension Take 30 mLs by mouth at bedtime as needed for mild constipation.      . magnesium oxide (MAG-OX) 400 MG tablet Take 400 mg by mouth every morning.       . pantoprazole (PROTONIX) 40 MG tablet Take 40 mg by mouth every morning.       . potassium chloride SA (K-DUR,KLOR-CON) 20 MEQ tablet Take 40 mEq by mouth every morning.      Marland Kitchen. QUEtiapine (SEROQUEL) 100 MG tablet Take 100 mg by mouth at bedtime.      . senna-docusate (SENOKOT-S) 8.6-50 MG per tablet Take 1 tablet by mouth 2 (two) times daily.      . valsartan (DIOVAN) 320 MG tablet Take 1 tablet (320 mg total) by mouth daily.  30 tablet  11  . verapamil (CALAN-SR) 120 MG CR tablet Take 120 mg by mouth every morning.       . vitamin B-12 (CYANOCOBALAMIN) 1000 MCG tablet Take 1,000 mcg by mouth every morning.        No facility-administered medications prior to visit.    PAST MEDICAL HISTORY: Past Medical History  Diagnosis Date  . Hypertension   . GERD (gastroesophageal reflux disease)   . Dementia   . IBS (irritable bowel syndrome)   . Arthritis   . Constipation   . Hiatal hernia   . Bipolar 1 disorder   . Bronchitis   . Chronic back pain     PAST SURGICAL HISTORY: Past Surgical History  Procedure Laterality Date  . Gallbladder    . Partial hysterectomy    . Mandible surgery      FAMILY HISTORY: Family History  Problem Relation Age of Onset  . Emphysema Mother   . Cancer Other     SOCIAL HISTORY:  History   Social History  . Marital Status: Divorced    Spouse Name: N/A    Number of Children: 2  . Years of Education: 7 th   Occupational History     retired   Social History Main Topics  . Smoking  status: Former Smoker -- 0.50 packs/day for 2 years    Types: Cigarettes    Quit date: 05/28/1959  . Smokeless tobacco: Former Neurosurgeon    Types: Snuff    Quit date: 05/28/1959     Comment: Pt used snuff as a teenager  . Alcohol Use: No  . Drug Use: No  . Sexual Activity: No   Other Topics Concern  . Not on file   Social History Narrative   Patient lives [facility].   Retired   Education 7 th grade   Right handed.   Caffeine one cup of coffee daily     PHYSICAL EXAM   Filed Vitals:   07/01/13 0939  BP: 155/69  Pulse: 57  Height: 5' (1.524 m)  Weight: 176 lb (79.833 kg)    Body mass index is 34.37 kg/(m^2).   Generalized: In no acute distress  Neck: Supple, no carotid bruits   Cardiac: Regular rate rhythm  Pulmonary: Clear to auscultation bilaterally  Musculoskeletal: No deformity  Neurological examination  Mentation: Alert oriented to time, place, history taking, and causual conversation she has had frequent mouse chewing movement, Mini-Mental Status Examination is 28 out of 30, she missed 1 out of 3 recalls, has difficulty write a complete sentence,     Cranial nerve II-XII: Pupils were equal round reactive to light extraocular movements were full, Visual field were full on confrontational test. Bilateral fundi were sharp.  Facial sensation and strength were normal. Hearing was intact to finger rubbing bilaterally. Uvula tongue midline.  head turning and shoulder shrug and were normal and symmetric.Tongue protrusion into cheek strength was normal.  Motor: normal tone, bulk and strength, Mild bilateral hands resting tremor, there was no significant rigidity, or bradykinesia    Sensory: Intact to fine touch, pinprick, preserved vibratory sensation, and proprioception at toes.  Coordination: Normal finger to nose, heel-to-shin bilaterally there was no truncal ataxia  Gait: Rising up from seated  position  pushing on her wheelchair, cautious, right base, mildly unsteady gait     Deep tendon reflexes: Brachioradialis 2/2, biceps 2/2, triceps 2/2, patellar 2/2, Achilles trace , plantar responses were flexor bilaterally.   DIAGNOSTIC DATA (LABS, IMAGING, TESTING) - I reviewed patient records, labs, notes, testing and imaging myself where available.  Lab Results  Component Value Date   WBC 7.0 04/18/2013   HGB 12.8 04/18/2013   HCT 38.1 04/18/2013   MCV 86.6 04/18/2013   PLT 277 04/18/2013      Component Value Date/Time   NA 141 04/18/2013 1948   K 3.1* 04/18/2013 1948   CL 100 04/18/2013 1948   CO2 29 04/18/2013 1948   GLUCOSE 118* 04/18/2013 1948   BUN 12 04/18/2013 1948   CREATININE 0.90 04/18/2013 1948   CALCIUM 10.0 04/18/2013 1948   PROT 7.4 04/18/2013 1948   ALBUMIN 3.9 04/18/2013 1948   AST 24 04/18/2013 1948   ALT 22 04/18/2013 1948   ALKPHOS 103 04/18/2013 1948   BILITOT 0.3 04/18/2013 1948   GFRNONAA 62* 04/18/2013 1948   GFRAA 72* 04/18/2013 1948   No results found for this basename: CHOL, HDL, LDLCALC, LDLDIRECT, TRIG, CHOLHDL   Lab Results  Component Value Date   HGBA1C 6.6* 02/21/2013   No results found for this basename: [id number]   Lab Results  Component Value Date   TSH 1.326 02/21/2013      ASSESSMENT AND PLAN    73 years old Caucasian female, with past medical history of bipolar disorder, was  on polypharmacy treatment, previously was also treated with Latuda, D 2 antagonists, developed worsening bilateral hands tremor,abnormal mouth movement, most suggestive medicine side effect, tardive dyskinesia, which has much improved with stopping Latuda.    to evaluate laboratory including thyroid function, I will call her laboratory results, continue physical therapy, return to clinic in 6 months with Gerlene Fee, M.D. Ph.D.  [facility] [address] [place], [place] [address] [phone]

## 2013-07-02 LAB — COMPREHENSIVE METABOLIC PANEL
ALT: 13 IU/L (ref 0–32)
AST: 16 IU/L (ref 0–40)
Albumin/Globulin Ratio: 1.7 (ref 1.1–2.5)
Albumin: 4.2 g/dL (ref 3.5–4.8)
Alkaline Phosphatase: 109 IU/L (ref 39–117)
BUN/Creatinine Ratio: 15 (ref 11–26)
BUN: 12 mg/dL (ref 8–27)
CALCIUM: 9.6 mg/dL (ref 8.7–10.3)
CO2: 25 mmol/L (ref 18–29)
Chloride: 95 mmol/L — ABNORMAL LOW (ref 97–108)
Creatinine, Ser: 0.81 mg/dL (ref 0.57–1.00)
GFR calc Af Amer: 83 mL/min/{1.73_m2} (ref >59)
GFR calc non Af Amer: 72 mL/min/{1.73_m2} (ref >59)
GLUCOSE: 90 mg/dL (ref 65–99)
Globulin, Total: 2.5 g/dL (ref 1.5–4.5)
POTASSIUM: 5 mmol/L (ref 3.5–5.2)
SODIUM: 136 mmol/L (ref 134–144)
Total Bilirubin: 0.2 mg/dL (ref 0.0–1.2)
Total Protein: 6.7 g/dL (ref 6.0–8.5)

## 2013-07-02 LAB — CBC WITH DIFFERENTIAL
Basophils Absolute: 0 10*3/uL (ref 0.0–0.2)
Basos: 0 %
EOS ABS: 0.1 10*3/uL (ref 0.0–0.4)
EOS: 2 %
HCT: 35.1 % (ref 34.0–46.6)
HEMOGLOBIN: 11.7 g/dL (ref 11.1–15.9)
IMMATURE GRANS (ABS): 0 10*3/uL (ref 0.0–0.1)
Immature Granulocytes: 0 %
Lymphocytes Absolute: 2.1 10*3/uL (ref 0.7–3.1)
Lymphs: 31 %
MCH: 28.4 pg (ref 26.6–33.0)
MCHC: 33.3 g/dL (ref 31.5–35.7)
MCV: 85 fL (ref 79–97)
Monocytes Absolute: 0.7 10*3/uL (ref 0.1–0.9)
Monocytes: 10 %
NEUTROS PCT: 57 %
Neutrophils Absolute: 3.7 10*3/uL (ref 1.4–7.0)
Platelets: 271 10*3/uL (ref 150–379)
RBC: 4.12 x10E6/uL (ref 3.77–5.28)
RDW: 15.2 % (ref 12.3–15.4)
WBC: 6.6 10*3/uL (ref 3.4–10.8)

## 2013-07-02 LAB — THYROID PANEL WITH TSH
FREE THYROXINE INDEX: 1.6 (ref 1.2–4.9)
T3 UPTAKE RATIO: 29 % (ref 24–39)
T4, Total: 5.6 ug/dL (ref 4.5–12.0)
TSH: 2.65 u[IU]/mL (ref 0.450–4.500)

## 2013-07-02 LAB — VITAMIN B12: Vitamin B-12: >1999 pg/mL — ABNORMAL HIGH (ref 211–946)

## 2013-07-24 ENCOUNTER — Emergency Department (HOSPITAL_COMMUNITY): Payer: Medicare Other

## 2013-07-24 ENCOUNTER — Encounter (HOSPITAL_COMMUNITY): Payer: Self-pay | Admitting: Emergency Medicine

## 2013-07-24 ENCOUNTER — Inpatient Hospital Stay (HOSPITAL_COMMUNITY)
Admission: EM | Admit: 2013-07-24 | Discharge: 2013-07-27 | DRG: 871 | Disposition: A | Discharge status: Home / Self Care | Payer: Medicare Other | Attending: Internal Medicine | Admitting: Internal Medicine

## 2013-07-24 DIAGNOSIS — E119 Type 2 diabetes mellitus without complications: Secondary | ICD-10-CM | POA: Diagnosis present

## 2013-07-24 DIAGNOSIS — F329 Major depressive disorder, single episode, unspecified: Secondary | ICD-10-CM | POA: Diagnosis present

## 2013-07-24 DIAGNOSIS — Z9981 Dependence on supplemental oxygen: Secondary | ICD-10-CM | POA: Diagnosis present

## 2013-07-24 DIAGNOSIS — R6520 Severe sepsis without septic shock: Secondary | ICD-10-CM

## 2013-07-24 DIAGNOSIS — K219 Gastro-esophageal reflux disease without esophagitis: Secondary | ICD-10-CM | POA: Diagnosis present

## 2013-07-24 DIAGNOSIS — K589 Irritable bowel syndrome without diarrhea: Secondary | ICD-10-CM | POA: Diagnosis present

## 2013-07-24 DIAGNOSIS — I5032 Chronic diastolic (congestive) heart failure: Secondary | ICD-10-CM | POA: Diagnosis present

## 2013-07-24 DIAGNOSIS — I10 Essential (primary) hypertension: Secondary | ICD-10-CM | POA: Diagnosis present

## 2013-07-24 DIAGNOSIS — M549 Dorsalgia, unspecified: Secondary | ICD-10-CM | POA: Diagnosis present

## 2013-07-24 DIAGNOSIS — Z7902 Long term (current) use of antithrombotics/antiplatelets: Secondary | ICD-10-CM

## 2013-07-24 DIAGNOSIS — J441 Chronic obstructive pulmonary disease with (acute) exacerbation: Secondary | ICD-10-CM

## 2013-07-24 DIAGNOSIS — F3289 Other specified depressive episodes: Secondary | ICD-10-CM | POA: Diagnosis present

## 2013-07-24 DIAGNOSIS — I5030 Unspecified diastolic (congestive) heart failure: Secondary | ICD-10-CM | POA: Diagnosis present

## 2013-07-24 DIAGNOSIS — J9600 Acute respiratory failure, unspecified whether with hypoxia or hypercapnia: Secondary | ICD-10-CM | POA: Diagnosis present

## 2013-07-24 DIAGNOSIS — D649 Anemia, unspecified: Secondary | ICD-10-CM | POA: Diagnosis present

## 2013-07-24 DIAGNOSIS — F03918 Unspecified dementia, unspecified severity, with other behavioral disturbance: Secondary | ICD-10-CM | POA: Diagnosis present

## 2013-07-24 DIAGNOSIS — K449 Diaphragmatic hernia without obstruction or gangrene: Secondary | ICD-10-CM | POA: Diagnosis present

## 2013-07-24 DIAGNOSIS — R5381 Other malaise: Secondary | ICD-10-CM | POA: Diagnosis present

## 2013-07-24 DIAGNOSIS — G8929 Other chronic pain: Secondary | ICD-10-CM | POA: Diagnosis present

## 2013-07-24 DIAGNOSIS — J189 Pneumonia, unspecified organism: Secondary | ICD-10-CM | POA: Insufficient documentation

## 2013-07-24 DIAGNOSIS — J45901 Unspecified asthma with (acute) exacerbation: Secondary | ICD-10-CM

## 2013-07-24 DIAGNOSIS — F0391 Unspecified dementia with behavioral disturbance: Secondary | ICD-10-CM | POA: Diagnosis present

## 2013-07-24 DIAGNOSIS — F319 Bipolar disorder, unspecified: Secondary | ICD-10-CM | POA: Diagnosis present

## 2013-07-24 DIAGNOSIS — E785 Hyperlipidemia, unspecified: Secondary | ICD-10-CM | POA: Diagnosis present

## 2013-07-24 DIAGNOSIS — Z87891 Personal history of nicotine dependence: Secondary | ICD-10-CM

## 2013-07-24 DIAGNOSIS — E871 Hypo-osmolality and hyponatremia: Secondary | ICD-10-CM | POA: Diagnosis present

## 2013-07-24 DIAGNOSIS — Z79899 Other long term (current) drug therapy: Secondary | ICD-10-CM

## 2013-07-24 DIAGNOSIS — A419 Sepsis, unspecified organism: Principal | ICD-10-CM | POA: Diagnosis present

## 2013-07-24 LAB — COMPREHENSIVE METABOLIC PANEL
ALK PHOS: 105 U/L (ref 39–117)
ALT: 15 U/L (ref 0–35)
AST: 30 U/L (ref 0–37)
Albumin: 3.8 g/dL (ref 3.5–5.2)
BILIRUBIN TOTAL: 0.4 mg/dL (ref 0.3–1.2)
BUN: 15 mg/dL (ref 6–23)
CALCIUM: 9.2 mg/dL (ref 8.4–10.5)
CHLORIDE: 91 meq/L — AB (ref 96–112)
CO2: 25 mEq/L (ref 19–32)
Creatinine, Ser: 0.88 mg/dL (ref 0.50–1.10)
GFR calc Af Amer: 74 mL/min — ABNORMAL LOW (ref >90)
GFR, EST NON AFRICAN AMERICAN: 64 mL/min — AB (ref >90)
GLUCOSE: 120 mg/dL — AB (ref 70–99)
POTASSIUM: 4.9 meq/L (ref 3.7–5.3)
SODIUM: 132 meq/L — AB (ref 137–147)
Total Protein: 7.7 g/dL (ref 6.0–8.3)

## 2013-07-24 LAB — URINALYSIS, ROUTINE W REFLEX MICROSCOPIC
BILIRUBIN URINE: NEGATIVE
GLUCOSE, UA: NEGATIVE mg/dL
Ketones, ur: NEGATIVE mg/dL
Leukocytes, UA: NEGATIVE
Nitrite: NEGATIVE
PROTEIN: NEGATIVE mg/dL
Specific Gravity, Urine: 1.012 (ref 1.005–1.030)
UROBILINOGEN UA: 0.2 mg/dL (ref 0.0–1.0)
pH: 7 (ref 5.0–8.0)

## 2013-07-24 LAB — URINE MICROSCOPIC-ADD ON

## 2013-07-24 LAB — CBC
HCT: 34.7 % — ABNORMAL LOW (ref 36.0–46.0)
Hemoglobin: 11.9 g/dL — ABNORMAL LOW (ref 12.0–15.0)
MCH: 28.9 pg (ref 26.0–34.0)
MCHC: 34.3 g/dL (ref 30.0–36.0)
MCV: 84.2 fL (ref 78.0–100.0)
Platelets: 228 10*3/uL (ref 150–400)
RBC: 4.12 MIL/uL (ref 3.87–5.11)
RDW: 15.5 % (ref 11.5–15.5)
WBC: 9.1 10*3/uL (ref 4.0–10.5)

## 2013-07-24 LAB — POC OCCULT BLOOD, ED: Fecal Occult Bld: POSITIVE — AB

## 2013-07-24 LAB — TYPE AND SCREEN
ABO/RH(D): A POS
ANTIBODY SCREEN: NEGATIVE

## 2013-07-24 LAB — TROPONIN I

## 2013-07-24 LAB — PROTIME-INR
INR: 1.08 (ref 0.00–1.49)
Prothrombin Time: 13.8 seconds (ref 11.6–15.2)

## 2013-07-24 LAB — I-STAT ARTERIAL BLOOD GAS, ED
ACID-BASE EXCESS: 4 mmol/L — AB (ref 0.0–2.0)
Bicarbonate: 27.1 mEq/L — ABNORMAL HIGH (ref 20.0–24.0)
O2 SAT: 100 %
PO2 ART: 185 mmHg — AB (ref 80.0–100.0)
TCO2: 28 mmol/L (ref 0–100)
pCO2 arterial: 35.7 mmHg (ref 35.0–45.0)
pH, Arterial: 7.488 — ABNORMAL HIGH (ref 7.350–7.450)

## 2013-07-24 LAB — MAGNESIUM: Magnesium: 1.9 mg/dL (ref 1.5–2.5)

## 2013-07-24 LAB — RAPID URINE DRUG SCREEN, HOSP PERFORMED
AMPHETAMINES: NOT DETECTED
BARBITURATES: NOT DETECTED
Benzodiazepines: NOT DETECTED
Cocaine: NOT DETECTED
Opiates: NOT DETECTED
Tetrahydrocannabinol: NOT DETECTED

## 2013-07-24 LAB — APTT: APTT: 33 s (ref 24–37)

## 2013-07-24 LAB — PRO B NATRIURETIC PEPTIDE: PRO B NATRI PEPTIDE: 1247 pg/mL — AB (ref 0–125)

## 2013-07-24 LAB — ABO/RH: ABO/RH(D): A POS

## 2013-07-24 LAB — GLUCOSE, CAPILLARY: Glucose-Capillary: 251 mg/dL — ABNORMAL HIGH (ref 70–99)

## 2013-07-24 MED ORDER — VANCOMYCIN HCL IN DEXTROSE 1-5 GM/200ML-% IV SOLN
1000.0000 mg | Freq: Once | INTRAVENOUS | Status: AC
  Administered 2013-07-24: 1000 mg via INTRAVENOUS
  Filled 2013-07-24 (×2): qty 200

## 2013-07-24 MED ORDER — HEPARIN SODIUM (PORCINE) 5000 UNIT/ML IJ SOLN
5000.0000 [IU] | Freq: Three times a day (TID) | INTRAMUSCULAR | Status: DC
  Administered 2013-07-24 – 2013-07-25 (×3): 5000 [IU] via SUBCUTANEOUS
  Filled 2013-07-24 (×5): qty 1

## 2013-07-24 MED ORDER — SODIUM CHLORIDE 0.9 % IJ SOLN
3.0000 mL | Freq: Two times a day (BID) | INTRAMUSCULAR | Status: DC
  Administered 2013-07-24 – 2013-07-27 (×5): 3 mL via INTRAVENOUS

## 2013-07-24 MED ORDER — ONDANSETRON HCL 4 MG PO TABS: 4.0000 mg | ORAL_TABLET | Freq: Four times a day (QID) | ORAL | Status: DC | PRN

## 2013-07-24 MED ORDER — VANCOMYCIN HCL IN DEXTROSE 750-5 MG/150ML-% IV SOLN
750.0000 mg | Freq: Two times a day (BID) | INTRAVENOUS | Status: DC
  Administered 2013-07-25 – 2013-07-26 (×3): 750 mg via INTRAVENOUS
  Filled 2013-07-24 (×5): qty 150

## 2013-07-24 MED ORDER — IPRATROPIUM BROMIDE 0.02 % IN SOLN
0.5000 mg | Freq: Once | RESPIRATORY_TRACT | Status: AC
  Administered 2013-07-24: 0.5 mg via RESPIRATORY_TRACT
  Filled 2013-07-24: qty 2.5

## 2013-07-24 MED ORDER — LEVOFLOXACIN IN D5W 750 MG/150ML IV SOLN
750.0000 mg | Freq: Once | INTRAVENOUS | Status: AC
  Administered 2013-07-24: 750 mg via INTRAVENOUS
  Filled 2013-07-24: qty 150

## 2013-07-24 MED ORDER — ONDANSETRON HCL 4 MG/2ML IJ SOLN: 4.0000 mg | Freq: Four times a day (QID) | INTRAMUSCULAR | Status: DC | PRN

## 2013-07-24 MED ORDER — MORPHINE SULFATE 2 MG/ML IJ SOLN: 1.0000 mg | INTRAMUSCULAR | Status: DC | PRN

## 2013-07-24 MED ORDER — METHYLPREDNISOLONE SODIUM SUCC 125 MG IJ SOLR
125.0000 mg | Freq: Once | INTRAMUSCULAR | Status: AC
  Administered 2013-07-24: 125 mg via INTRAVENOUS
  Filled 2013-07-24: qty 2

## 2013-07-24 MED ORDER — ALBUTEROL SULFATE (2.5 MG/3ML) 0.083% IN NEBU
5.0000 mg | INHALATION_SOLUTION | Freq: Once | RESPIRATORY_TRACT | Status: AC
  Administered 2013-07-24: 5 mg via RESPIRATORY_TRACT
  Filled 2013-07-24: qty 6

## 2013-07-24 MED ORDER — FUROSEMIDE 10 MG/ML IJ SOLN
40.0000 mg | Freq: Once | INTRAMUSCULAR | Status: AC
  Administered 2013-07-24: 40 mg via INTRAVENOUS
  Filled 2013-07-24: qty 4

## 2013-07-24 MED ORDER — DEXTROSE 5 % IV SOLN
1.0000 g | Freq: Three times a day (TID) | INTRAVENOUS | Status: DC
  Administered 2013-07-24 – 2013-07-26 (×5): 1 g via INTRAVENOUS
  Filled 2013-07-24 (×9): qty 1

## 2013-07-24 MED ORDER — IPRATROPIUM-ALBUTEROL 0.5-2.5 (3) MG/3ML IN SOLN
3.0000 mL | RESPIRATORY_TRACT | Status: DC
  Administered 2013-07-24 – 2013-07-25 (×4): 3 mL via RESPIRATORY_TRACT
  Filled 2013-07-24 (×4): qty 3

## 2013-07-24 NOTE — H&P (Signed)
Date: 07/24/2013               Patient Name:  Cindy Padilla MRN: [id number]  DOB: 06-Aug-1940 Age / Sex: 73 y.o., female   PCP: Garwin Brothers, MD           Medical Service: Internal Medicine Teaching Service         Attending Physician: Dr. Karren Cobble, MD    First Contact: Dr. Ivin Poot, MD Pager: [phone] (7AM-5PM Mon-Fri)  Second Contact: Dr. Clinton Gallant, MD Pager: [phone]       After Hours (After 5p/  First Contact Pager: [phone]  weekends / holidays): Second Contact Pager: [phone]    Most Recent Discharge Date:  04/19/13  Chief Complaint:  Chief Complaint  Patient presents with  . Shortness of Breath       History of present illness: Pt is a 73 y.o. female with a PMH of ?asthma/COPD (04/16/13 saw Dr. Lanae Boast on 2L O2 @ALF ) HTN, GERD/hiatal hernia, dementia, IBS, arthritis, bipolar 1 disorder; DMII, and chronic back pain.  Pt presents to the ED from [facility] with SOB, cough, and fatigue for the past few days.  The history was mainly obtained from chart review due to patient being on BiPAP and slightly confused in the ED.  She has been SOB for the past several days with significant cough and wheezing.  She also had post-tussive emesis.  According to EMS she was in significant distress with tachypnea (RR~50; initial O2 sats 90%RA), wheezing, and coarse rales and was placed on CPAP prior to arrival.  In the ED she was placed on BiPAP and given 1 dose 130m of solumedrol with duonebs and improvement in her breathing and RR decreased to ~30.  She was hypertensive at 180/94 with HR~70.  She denied any CP or abdominal pain.    Of note, 02/24/2013 was discharged for acute respiratory failure thought to be due to acute dCHF/ACOPDE.  CXR at that time did not reveal obvious infiltrate and improved on oral lasix.  She was also placed on prednisone taper, duonebs, symbicort, and lasix at discharge.     Meds: Current Facility-Administered Medications    Medication Dose Route Frequency Provider Last Rate Last Dose  . aztreonam (AZACTAM) 1 g in dextrose 5 % 50 mL IVPB  1 g Intravenous Q8H RRande LawmanRumbarger, R[facility]     . heparin injection 5,000 Units  5,000 Units Subcutaneous 3 times per day NClinton Gallant MD      . ipratropium-albuterol (DUONEB) 0.5-2.5 (3) MG/3ML nebulizer solution 3 mL  3 mL Nebulization Q4H NClinton Gallant MD   3 mL at 07/24/13 2102  . ondansetron (ZOFRAN) tablet 4 mg  4 mg Oral Q6H PRN NClinton Gallant MD       Or  . ondansetron ([facility] injection 4 mg  4 mg Intravenous Q6H PRN NClinton Gallant MD      . sodium chloride 0.9 % injection 3 mL  3 mL Intravenous Q12H NClinton Gallant MD      . [Derrill MemoON 07/25/2013] vancomycin (VANCOCIN) IVPB 750 mg/150 ml premix  750 mg Intravenous Q12H RRande LawmanRumbarger, R[facility]       Prescriptions prior to admission  Medication Sig Dispense Refill  . albuterol (PROVENTIL HFA;VENTOLIN HFA) 108 (90 BASE) MCG/ACT inhaler Inhale 2 puffs into the lungs every 4 (four) hours as needed for wheezing or shortness of breath.      .Marland Kitchenalbuterol (PROVENTIL) (2.5 MG/3ML) 0.083% nebulizer  solution Take 2.5 mg by nebulization every 2 (two) hours as needed for wheezing or shortness of breath.      Marland Kitchen alum & mag hydroxide-simeth (GERI-LANTA) 200-200-20 MG/5ML suspension Take 30 mLs by mouth See admin instructions. Give 1 hour after meals and at bedtime as needed for indigestion      . antiseptic oral rinse (BIOTENE) LIQD 15 mLs by Mouth Rinse route at bedtime.      Marland Kitchen atorvastatin (LIPITOR) 40 MG tablet Take 40 mg by mouth daily.       . benztropine (COGENTIN) 1 MG tablet Take 1 mg by mouth 2 (two) times daily.      . budesonide-formoterol (SYMBICORT) 160-4.5 MCG/ACT inhaler Inhale 2 puffs into the lungs 2 (two) times daily. Rinse mouth after use      . carvedilol (COREG) 12.5 MG tablet Take 12.5 mg by mouth 2 (two) times daily.      . cholecalciferol (VITAMIN D) 1000 UNITS tablet Take 1,000 Units by mouth daily.       . clopidogrel  (PLAVIX) 75 MG tablet Take 75 mg by mouth daily.       Marland Kitchen donepezil (ARICEPT) 10 MG tablet Take 10 mg by mouth at bedtime.       . fluticasone (FLONASE) 50 MCG/ACT nasal spray Place 2 sprays into both nostrils daily.       Marland Kitchen lamoTRIgine (LAMICTAL) 200 MG tablet Take 200 mg by mouth 2 (two) times daily.      Marland Kitchen loratadine (CLARITIN) 10 MG tablet Take 10 mg by mouth daily.       Marland Kitchen LORazepam (ATIVAN) 1 MG tablet Take 1 mg by mouth every 12 (twelve) hours.      . magnesium oxide (MAG-OX) 400 MG tablet Take 400 mg by mouth daily.       . pantoprazole (PROTONIX) 40 MG tablet Take 40 mg by mouth daily before breakfast.       . polyethylene glycol (MIRALAX / GLYCOLAX) packet Take 17 g by mouth every other day. Dissolve in 6 oz of fluid and drink      . QUEtiapine (SEROQUEL) 100 MG tablet Take 100 mg by mouth 2 (two) times daily. Hold for sedation      . senna-docusate (SENOKOT-S) 8.6-50 MG per tablet Take 1 tablet by mouth 2 (two) times daily.      . valsartan (DIOVAN) 320 MG tablet Take 1 tablet (320 mg total) by mouth daily.  30 tablet  11  . verapamil (CALAN-SR) 120 MG CR tablet Take 120 mg by mouth daily.         Allergies: Allergies as of 07/24/2013 - Review Complete 07/24/2013  Allergen Reaction Noted  . Aspirin  01/07/2011  . Codeine  01/07/2011  . Ivp dye [iodinated diagnostic agents]  01/07/2011  . Morphine and related  12/25/2010  . Neomycin  01/07/2011  . Penicillins  12/25/2010  . Promethazine  12/25/2010  . Sulfa antibiotics  12/25/2010  . Tetracyclines & related  12/25/2010    PMH: Past Medical History  Diagnosis Date  . Hypertension   . GERD (gastroesophageal reflux disease)   . Dementia   . IBS (irritable bowel syndrome)   . Arthritis   . Constipation   . Hiatal hernia   . Bipolar 1 disorder   . Diabetes mellitus   . Bronchitis   . Chronic back pain     PSH: Past Surgical History  Procedure Laterality Date  . Gallbladder    . Partial hysterectomy    .  Mandible  surgery      FH: Family History  Problem Relation Age of Onset  . Emphysema Mother   . Cancer Other     SH: History  Substance Use Topics  . Smoking status: Former Smoker -- 0.50 packs/day for 2 years    Types: Cigarettes    Quit date: 05/28/1959  . Smokeless tobacco: Former Systems developer    Types: Snuff    Quit date: 05/28/1959     Comment: Pt used snuff as a teenager  . Alcohol Use: No    Review of Systems: Pertinent items are noted in HPI.  Physical Exam: Blood pressure 143/64, pulse 58, temperature 99.1 F (37.3 C), temperature source Axillary, resp. rate 19, height 5' (1.524 m), weight 176 lb 5.9 oz (80 kg), SpO2 94.00%.  Physical Exam Constitutional: Vital signs reviewed.  Patient is wearing BiPAP when I saw her and therefore communication was limited.  She appeared her stated age and was well-nourished. Head: Normocephalic and atraumatic Eyes: PERRL, EOMI, conjunctivae normal, no scleral icterus.  Neck: Supple, Trachea midline .  Cardiovascular: RRR, no MRG, pulses symmetric and intact bilaterally, b/l LE edema Pulmonary/Chest: increased work of breathing, with minimal wheezing but course crackles  Abdominal: Obese; non-tender, mildly distended; +bs,   Neurological: A&O x1, cranial nerve II-XII are grossly intact Skin: Warm, dry and intact.   Lab results:  Basic Metabolic Panel:  Recent Labs  07/24/13 1550 07/24/13 2059  NA 132*  --   K 4.9  --   CL 91*  --   CO2 25  --   GLUCOSE 120*  --   BUN 15  --   CREATININE 0.88  --   CALCIUM 9.2  --   MG  --  1.9   Anion Gap: 16  Liver Function Tests:  Recent Labs  07/24/13 1550  AST 30  ALT 15  ALKPHOS 105  BILITOT 0.4  PROT 7.7  ALBUMIN 3.8   No results found for this basename: LIPASE, AMYLASE,  in the last 72 hours No results found for this basename: AMMONIA,  in the last 72 hours  CBC:    Component Value Date/Time   WBC 9.1 07/24/2013 1550   WBC 6.6 07/01/2013 1056   RBC 4.12 07/24/2013 1550    RBC 4.12 07/01/2013 1056   HGB 11.9* 07/24/2013 1550   HCT 34.7* 07/24/2013 1550   PLT 228 07/24/2013 1550   MCV 84.2 07/24/2013 1550   MCH 28.9 07/24/2013 1550   MCH 28.4 07/01/2013 1056   MCHC 34.3 07/24/2013 1550   MCHC 33.3 07/01/2013 1056   RDW 15.5 07/24/2013 1550   RDW 15.2 07/01/2013 1056   LYMPHSABS 2.1 07/01/2013 1056   LYMPHSABS 2.8 04/18/2013 1948   MONOABS 0.8 04/18/2013 1948   EOSABS 0.1 07/01/2013 1056   EOSABS 0.3 04/18/2013 1948   BASOSABS 0.0 07/01/2013 1056   BASOSABS 0.0 04/18/2013 1948    Lipase: Lab Results  Component Value Date   LIPASE 85* 04/18/2013    Lactic Acid: No components found with this basename: lacticacidven    Cardiac Enzymes: No results found for this basename: TROPIPOC,  in the last 72 hours Lab Results  Component Value Date   CKTOTAL 83 07/22/2012   CKMB 3.7 01/07/2011   TROPONINI <0.30 07/24/2013    BNP:  Recent Labs  07/24/13 1550  PROBNP 1247.0*   ABG    Component Value Date/Time   PHART 7.488* 07/24/2013 1639   PCO2ART 35.7 07/24/2013 1639  PO2ART 185.0* 07/24/2013 1639   HCO3 27.1* 07/24/2013 1639   TCO2 28 07/24/2013 1639   O2SAT 100.0 07/24/2013 1639    D-Dimer: No results found for this basename: DDIMER,  in the last 72 hours  Urine Drug Screen: Drugs of Abuse:     Component Value Date/Time   LABOPIA NONE DETECTED 07/24/2013 2038   [place] DETECTED 07/24/2013 2038   LABBENZ NONE DETECTED 07/24/2013 2038   AMPHETMU NONE DETECTED 07/24/2013 2038   THCU NONE DETECTED 07/24/2013 2038   LABBARB NONE DETECTED 07/24/2013 2038    Alcohol Level: No results found for this basename: ETH,  in the last 72 hours  Urinalysis:   Recent Labs  07/24/13 2038  COLORURINE YELLOW  LABSPEC 1.012  PHURINE 7.0  GLUCOSEU NEGATIVE  HGBUR TRACE*  BILIRUBINUR NEGATIVE  KETONESUR NEGATIVE  PROTEINUR NEGATIVE  UROBILINOGEN 0.2  NITRITE NEGATIVE  LEUKOCYTESUR NEGATIVE     Imaging results:  Dg Chest Port 1 View  07/24/2013    CLINICAL DATA:  Shortness of breath, weakness  EXAM: PORTABLE CHEST - 1 VIEW  COMPARISON:  04/18/2013  FINDINGS: Cardiomegaly is noted. Elevation of the right hemidiaphragm again noted. Right basilar atelectasis or early infiltrate. No pulmonary edema.  IMPRESSION: Cardiomegaly. Mild elevation of the right hemidiaphragm again noted. Linear atelectasis or infiltrate right base. No pulmonary edema.   Electronically Signed   By: Lahoma Crocker M.D.   On: 07/24/2013 16:23    EKG: EKG Interpretation  Date/Time:  07/24/13   Ventricular Rate:   68 PR Interval:   111ms QRS Duration:  4ms QT Interval:   430ms QTC Calculation:  428ms Axis:    Normal Text Interpretation:  NSR, PAC, low voltage in precordial leads, no EKG changes from prior    Antibiotics: Antibiotics Given (last 72 hours)   Date/Time Action Medication Dose Rate   07/24/13 2043 Given   vancomycin (VANCOCIN) IVPB 1000 mg/200 mL premix 1,000 mg 200 mL/hr      Anti-infectives   Start     Dose/Rate Route Frequency Ordered Stop   07/25/13 0900  vancomycin (VANCOCIN) IVPB 750 mg/150 ml premix     750 mg 150 mL/hr over 60 Minutes Intravenous Every 12 hours 07/24/13 2000     07/24/13 2100  aztreonam (AZACTAM) 1 g in dextrose 5 % 50 mL IVPB     1 g 100 mL/hr over 30 Minutes Intravenous Every 8 hours 07/24/13 2000     07/24/13 1915  vancomycin (VANCOCIN) IVPB 1000 mg/200 mL premix     1,000 mg 200 mL/hr over 60 Minutes Intravenous  Once 07/24/13 1904 07/24/13 2143   07/24/13 1730  levofloxacin (LEVAQUIN) IVPB 750 mg     750 mg 100 mL/hr over 90 Minutes Intravenous  Once 07/24/13 1728 07/24/13 2010      SIRS/Sepsis/Septic Shock criteria met: Yes   Consults:    Assessment & Plan by Problem: Principal Problem:   Sepsis Active Problems:   HTN (hypertension)   Acute respiratory failure   HCAP (healthcare-associated pneumonia)  Pt is a 73 y.o. female with a PMH of ?asthma/COPD (04/16/13 saw Dr. Lanae Boast on 2L O2 $Rem'@ALF'Anqs$ )  HTN, GERD/hiatal hernia, dementia, IBS, arthritis, bipolar disorder/depression, DMII, and chronic back pain who presents to the ED from [facility] with SOB, cough, and fatigue for the past few days.   #Shortness of breath - Pt presents from [facility] with worsening cough, SOB, and fatigue.  She is febrile at 103 and tachypneic, however, without  leukocytosis.  1 view CXR reveals cardiomegaly, with mild elevation of right hemidiaphragm, with linear atelectasis vs infiltrate in R base without pulmonary edema.  02/23/13 TTE reveals an LVEF of 55-60% with grade 1 diastolic dysfunction.  DDx includes HCAP (febrile, CXR reveals possible infiltrate), acute dCHF (CXR without pulmonary edema, but does reveal cardiomegaly-and previously discharged on lasix but not on home med list), aspiration pneumonia (h/o dementia), acute PE (Wells score difficult to calculate in setting of limited history), or ACOPDE/asthma (pt carries this diagnosis and is on nebulizer, inhaler, and symbicort for home meds.)  -admit to SDU -BCx -Troponins x3 -repeat CXR with 2V -influenza panel, strep pneumo/legionella antigen -duonebs q6h   -vancomycin/aztreonam per pharmacy for HCAP coverage -O2 [place] to keep O2sat>92% -continuous pulse oximetry -BiPAP PRN -NPO while on BiPAP -UDS  #Sepsis - -plan per above  -UA  #Acute hypoxemic respiratory failure - Pt initial O2 sat was 90% RA upon ED arrival.  -plan per above -would d/c ativan  #?COPD/asthma -  Pt is on home O2@2L . Unable to locate PFTs, however, pt is without CO2 retention and metabolic alkalosis.  Pt last exacerbation requiring hospitalization was 02/24/13.  Unknown if patient has ever been intubated.  She is followed by Dr. Melvyn Novas. -plan per above  #Grade 1 diastolic dysfunction  -  Pt appears mildly hypervolemic on exam with LE edema.  CXR is without effusions or vascular congestion.  BNP is 1247, which is slightly elevated from 01/2813 of 1225.  Previous  TTE reveals an LVEF of 55-60% with grade 1 diastolic dysfunction.   -gave 1 dose of lasix 37m IV -consider additional lasix if not improving -avoid NSAIDS -continue home meds -daily weights -I/O  #Hypertension -  Stable.  -continue home meds   #/Controlled Diabetes Mellitus II -   Pt not on home meds; last HA1c 02/21/13 6.6. -would not use SSI at this time Lab Results  Component Value Date   HGBA1C 6.6* 02/21/2013    #GERD -  -continue home meds  #Dyslipidemia -  -continue statin therapy  #Anemia -  H/H stable; baseline hgb 12.  Pt has no symptoms/signs of bleeding.  FOBT in ED was positive. 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  Anticipated discharge in approximately 1-2 day(s).    Emergency Contact: Contact Information   Name Relation Home Work MHarrisonDaughter [phone]   Apparently daughter SManuela Schwartzis her POA.    The patient does have a current PCP (Garwin Brothers MD) and [facility].  Signed: JMichail Jewels MD PGY-1, Internal Medicine  07/24/2013, 10:09 PM

## 2013-07-24 NOTE — ED Notes (Addendum)
ems called out for sob that has been going on for days. Pt unable to keep posture. Pt reports cough to the point of vomiting. Lung sounds wheezing and course rales throughout. ems administered 5mg  albuterol 0.5 atrovent. ems placed pt on bipap. resp decreased from 50 to 30. Initial o2 stats 90% RA. Pt has DNR. Hx copd. 12 lead shows a-fib. Pt htn- 180/94 hr-70

## 2013-07-24 NOTE — ED Provider Notes (Addendum)
CSN: [id number]     Arrival date & time 07/24/13  1526 History   First MD Initiated Contact with Patient 07/24/13 1549     Chief Complaint  Patient presents with  . Shortness of Breath    HPI Patient presents to the emergency room with complaints of shortness of breath. Symptoms started several days ago. Patient has been having trouble with coughing and wheezing. She's also reported posttussive emesis. EMS found the patient to be in significant distress with wheezing and coarse rales. She was given 5 mg of albuterol and placed on BiPAP. Patient's respiratory rate improved. Patient does have history of COPD.  Patient denies any abdominal pain or chest pain. She has not noticed any leg swelling.  Past Medical History  Diagnosis Date  . Hypertension   . GERD (gastroesophageal reflux disease)   . Dementia   . IBS (irritable bowel syndrome)   . Arthritis   . Constipation   . Hiatal hernia   . Bipolar 1 disorder   . Diabetes mellitus   . Bronchitis   . Chronic back pain    Past Surgical History  Procedure Laterality Date  . Gallbladder    . Partial hysterectomy    . Mandible surgery     Family History  Problem Relation Age of Onset  . Emphysema Mother   . Cancer Other    History  Substance Use Topics  . Smoking status: Former Smoker -- 0.50 packs/day for 2 years    Types: Cigarettes    Quit date: 05/28/1959  . Smokeless tobacco: Former Neurosurgeon    Types: Snuff    Quit date: 05/28/1959     Comment: Pt used snuff as a teenager  . Alcohol Use: No   OB History   Grav Para Term Preterm Abortions TAB SAB Ect Mult Living                 Review of Systems  All other systems reviewed and are negative.      Allergies  Aspirin; Codeine; Ivp dye; Morphine and related; Neomycin; Penicillins; Promethazine; Sulfa antibiotics; and Tetracyclines & related  Home Medications   Current Outpatient Rx  Name  Route  Sig  Dispense  Refill  . acetaminophen (TYLENOL) 500 MG tablet    Oral   Take 500 mg by mouth every 4 (four) hours as needed for pain or fever.         Marland Kitchen albuterol (PROVENTIL HFA;VENTOLIN HFA) 108 (90 BASE) MCG/ACT inhaler   Inhalation   Inhale 2 puffs into the lungs every 4 (four) hours as needed for wheezing or shortness of breath.         Marland Kitchen albuterol (PROVENTIL) (2.5 MG/3ML) 0.083% nebulizer solution   Nebulization   Take 2.5 mg by nebulization every 2 (two) hours as needed for wheezing or shortness of breath.         Marland Kitchen alum & mag hydroxide-simeth (GERI-LANTA) 200-200-20 MG/5ML suspension   Oral   Take 30 mLs by mouth every 6 (six) hours as needed for indigestion or heartburn.         Marland Kitchen antiseptic oral rinse (BIOTENE) LIQD   Mouth Rinse   15 mLs by Mouth Rinse route at bedtime.         Marland Kitchen atorvastatin (LIPITOR) 40 MG tablet   Oral   Take 40 mg by mouth every morning.          . budesonide-formoterol (SYMBICORT) 160-4.5 MCG/ACT inhaler   Inhalation   Inhale  2 puffs into the lungs 2 (two) times daily.         . carvedilol (COREG) 12.5 MG tablet   Oral   Take 12.5 mg by mouth 2 (two) times daily.         . cholecalciferol (VITAMIN D) 1000 UNITS tablet   Oral   Take 1,000 Units by mouth every morning.          . clopidogrel (PLAVIX) 75 MG tablet   Oral   Take 75 mg by mouth every morning.          . donepezil (ARICEPT) 10 MG tablet   Oral   Take 10 mg by mouth at bedtime.          . fluticasone (FLONASE) 50 MCG/ACT nasal spray   Nasal   Place 2 sprays into the nose every morning.          . furosemide (LASIX) 20 MG tablet   Oral   Take 20 mg by mouth every morning.          Marland Kitchen. guaifenesin (ROBITUSSIN) 100 MG/5ML syrup   Oral   Take 200 mg by mouth 3 (three) times daily as needed for cough.         Marland Kitchen. HYDROcodone-acetaminophen (NORCO/VICODIN) 5-325 MG per tablet   Oral   Take 1 tablet by mouth every 6 (six) hours as needed for moderate pain.         Marland Kitchen. lamoTRIgine (LAMICTAL) 200 MG tablet   Oral    Take 200 mg by mouth 2 (two) times daily.         Marland Kitchen. lisinopril (PRINIVIL,ZESTRIL) 40 MG tablet   Oral   Take 40 mg by mouth every morning.         . loperamide (IMODIUM) 2 MG capsule   Oral   Take 2 mg by mouth as needed for diarrhea or loose stools.         Marland Kitchen. loratadine (CLARITIN) 10 MG tablet   Oral   Take 10 mg by mouth every morning.          Marland Kitchen. LORazepam (ATIVAN) 1 MG tablet   Oral   Take 1 mg by mouth every 12 (twelve) hours.         . magnesium hydroxide (MILK OF MAGNESIA) 400 MG/5ML suspension   Oral   Take 30 mLs by mouth at bedtime as needed for mild constipation.         . magnesium oxide (MAG-OX) 400 MG tablet   Oral   Take 400 mg by mouth every morning.          . pantoprazole (PROTONIX) 40 MG tablet   Oral   Take 40 mg by mouth every morning.          . potassium chloride SA (K-DUR,KLOR-CON) 20 MEQ tablet   Oral   Take 40 mEq by mouth every morning.         Marland Kitchen. QUEtiapine (SEROQUEL) 100 MG tablet   Oral   Take 100 mg by mouth at bedtime.         . senna-docusate (SENOKOT-S) 8.6-50 MG per tablet   Oral   Take 1 tablet by mouth 2 (two) times daily.         . valsartan (DIOVAN) 320 MG tablet   Oral   Take 1 tablet (320 mg total) by mouth daily.   30 tablet   11   . verapamil (CALAN-SR) 120 MG CR tablet  Oral   Take 120 mg by mouth every morning.          . vitamin B-12 (CYANOCOBALAMIN) 1000 MCG tablet   Oral   Take 1,000 mcg by mouth every morning.           BP 191/80  Pulse 70  Temp(Src) 103.4 F (39.7 C) (Rectal)  Resp 28  Ht 5' (1.524 m)  Wt 176 lb (79.833 kg)  BMI 34.37 kg/m2  SpO2 99% Physical Exam  Nursing note and vitals reviewed. Constitutional: She appears lethargic. She appears distressed.  HENT:  Head: Normocephalic and atraumatic.  Right Ear: External ear normal.  Left Ear: External ear normal.  Eyes: Conjunctivae are normal. Right eye exhibits no discharge. Left eye exhibits no discharge. No  scleral icterus.  Neck: Neck supple. No tracheal deviation present.  Cardiovascular: Normal rate, regular rhythm and intact distal pulses.   Pulmonary/Chest: Effort normal. No stridor. No respiratory distress. She has wheezes. She has rales.  Currently on BiPAP  Abdominal: Soft. Bowel sounds are normal. She exhibits no distension. There is no tenderness. There is no rebound and no guarding.  Genitourinary:  During nursing exam rectal temp performed, blood noted in the stool  Musculoskeletal: She exhibits no edema and no tenderness.  Neurological: She appears lethargic. No cranial nerve deficit (no facial droop, extraocular movements intact, no slurred speech) or sensory deficit. She exhibits normal muscle tone. She displays no seizure activity. Coordination normal.  Pt moves all extremities, answers questions appropriately, generalized weakness  Skin: Skin is warm and dry. No rash noted. She is not diaphoretic.  Psychiatric: She has a normal mood and affect.    ED Course  Procedures (including critical care time) Labs Review Labs Reviewed  CBC - Abnormal; Notable for the following:    Hemoglobin 11.9 (*)    HCT 34.7 (*)    All other components within normal limits  PRO B NATRIURETIC PEPTIDE - Abnormal; Notable for the following:    Pro B Natriuretic peptide (BNP) 1247.0 (*)    All other components within normal limits  COMPREHENSIVE METABOLIC PANEL - Abnormal; Notable for the following:    Sodium 132 (*)    Chloride 91 (*)    Glucose, Bld 120 (*)    GFR calc non Af Amer 64 (*)    GFR calc Af Amer 74 (*)    All other components within normal limits  I-STAT ARTERIAL BLOOD GAS, ED - Abnormal; Notable for the following:    pH, Arterial 7.488 (*)    pO2, Arterial 185.0 (*)    Bicarbonate 27.1 (*)    Acid-Base Excess 4.0 (*)    All other components within normal limits  POC OCCULT BLOOD, ED - Abnormal; Notable for the following:    Fecal Occult Bld POSITIVE (*)    All other  components within normal limits  PROTIME-INR  APTT  I-STAT TROPOININ, ED  TYPE AND SCREEN   Imaging Review Dg Chest Port 1 View  07/24/2013   CLINICAL DATA:  Shortness of breath, weakness  EXAM: PORTABLE CHEST - 1 VIEW  COMPARISON:  04/18/2013  FINDINGS: Cardiomegaly is noted. Elevation of the right hemidiaphragm again noted. Right basilar atelectasis or early infiltrate. No pulmonary edema.  IMPRESSION: Cardiomegaly. Mild elevation of the right hemidiaphragm again noted. Linear atelectasis or infiltrate right base. No pulmonary edema.   Electronically Signed   By: Natasha Mead M.D.   On: 07/24/2013 16:23     EKG Interpretation None  EKG Normal sinus rhythm, rate 68  premature atrial complex Low voltage in the precordial leads Normal ST T waves No prior EKG for comparison  Medications  levofloxacin (LEVAQUIN) IVPB 750 mg (not administered)  methylPREDNISolone sodium succinate (SOLU-MEDROL) 125 mg/2 mL injection 125 mg (125 mg Intravenous Given 07/24/13 1611)  albuterol (PROVENTIL) (2.5 MG/3ML) 0.083% nebulizer solution 5 mg (5 mg Nebulization Given 07/24/13 1622)  ipratropium (ATROVENT) nebulizer solution 0.5 mg (0.5 mg Nebulization Given 07/24/13 1622)    MDM   Final diagnoses:  COPD with acute exacerbation  CAP (community acquired pneumonia)   Patient presents with shortness of breath. She is responded to BiPAP. Her ABG does not show evidence of a respiratory acidosis. Her oxygen saturation is elevated in response to oxygen supplementation.  Patient does have a positive fecal occult however hair hemoglobin is stable.  This does have an elevated BNP however there's no significant pulmonary edema on her chest x-ray.  I suspect her symptoms are related to a COPD exacerbation. I will consult the medical service for admission and further treatment.  May have CAP, abx started.  Last hospitalization greater than 90 days ago   Celene Kras, MD 07/24/13 1722  Celene Kras,  MD 07/24/13 [phone]

## 2013-07-24 NOTE — ED Notes (Signed)
Pt. Arrived on cpap, placed on servoi niv-pc mffm.

## 2013-07-24 NOTE — Progress Notes (Signed)
ANTIBIOTIC CONSULT NOTE - INITIAL  Pharmacy Consult for vancomycin Indication: rule out pneumonia  Allergies  Allergen Reactions  . Aspirin     unknown  . Codeine     unknown  . Ivp Dye [Iodinated Diagnostic Agents]     unknown  . Morphine And Related     unknown  . Neomycin     unknown  . Penicillins     unknown  . Promethazine     unknown  . Sulfa Antibiotics     unknown  . Tetracyclines & Related     unknown    Patient Measurements: Height: 5' (152.4 cm) Weight: 176 lb (79.833 kg) IBW/kg (Calculated) : 45.5 Adjusted Body Weight:   Vital Signs: Temp: 103.4 F (39.7 C) (02/28 1549) Temp src: Rectal (02/28 1549) BP: 171/75 mmHg (02/28 1945) Pulse Rate: 76 (02/28 1945) Intake/Output from previous day:   Intake/Output from this shift:    Labs:  Recent Labs  07/24/13 1550  WBC 9.1  HGB 11.9*  PLT 228  CREATININE 0.88   Estimated Creatinine Clearance: 53.2 ml/min (by C-G formula based on Cr of 0.88). No results found for this basename: VANCOTROUGH, VANCOPEAK, VANCORANDOM, GENTTROUGH, GENTPEAK, GENTRANDOM, TOBRATROUGH, TOBRAPEAK, TOBRARND, AMIKACINPEAK, AMIKACINTROU, AMIKACIN,  in the last 72 hours   Microbiology: No results found for this or any previous visit (from the past 720 hour(s)).  Medical History: Past Medical History  Diagnosis Date  . Hypertension   . GERD (gastroesophageal reflux disease)   . Dementia   . IBS (irritable bowel syndrome)   . Arthritis   . Constipation   . Hiatal hernia   . Bipolar 1 disorder   . Diabetes mellitus   . Bronchitis   . Chronic back pain     Medications:  Anti-infectives   Start     Dose/Rate Route Frequency Ordered Stop   07/24/13 1915  vancomycin (VANCOCIN) IVPB 1000 mg/200 mL premix     1,000 mg 200 mL/hr over 60 Minutes Intravenous  Once 07/24/13 1904     07/24/13 1730  levofloxacin (LEVAQUIN) IVPB 750 mg     750 mg 100 mL/hr over 90 Minutes Intravenous  Once 07/24/13 1728        Assessment: 72 yof presented to the ED with SOB. To start empiric antibiotics for possible HCAP. Pt with Tmax of 103.4 and WBC is WNL. Pt has adequate renal fxn with Scr of 0.88. She has received a 1x dose of levaquin so far in the ED.  Vanc 2/28>> Aztreonam 2/28>> Levaquin x 1 2/28  Goal of Therapy:  Vancomycin trough level 15-20 mcg/ml  Plan:  1. Vancomycin 1gm IV x 1 then 750mg  IV Q12H 2. Aztreonam 1gm IV Q8H 3. F/u renal fxn, C&S, clinical [facility]  Meagen Limones, Drake Leachachel Lynn 07/24/2013,7:55 PM

## 2013-07-25 ENCOUNTER — Inpatient Hospital Stay (HOSPITAL_COMMUNITY): Payer: Medicare Other

## 2013-07-25 DIAGNOSIS — J9600 Acute respiratory failure, unspecified whether with hypoxia or hypercapnia: Secondary | ICD-10-CM

## 2013-07-25 DIAGNOSIS — I10 Essential (primary) hypertension: Secondary | ICD-10-CM

## 2013-07-25 DIAGNOSIS — F319 Bipolar disorder, unspecified: Secondary | ICD-10-CM

## 2013-07-25 DIAGNOSIS — J189 Pneumonia, unspecified organism: Secondary | ICD-10-CM

## 2013-07-25 DIAGNOSIS — I519 Heart disease, unspecified: Secondary | ICD-10-CM

## 2013-07-25 DIAGNOSIS — F0390 Unspecified dementia without behavioral disturbance: Secondary | ICD-10-CM

## 2013-07-25 DIAGNOSIS — A419 Sepsis, unspecified organism: Secondary | ICD-10-CM

## 2013-07-25 LAB — GLUCOSE, CAPILLARY
Glucose-Capillary: 162 mg/dL — ABNORMAL HIGH (ref 70–99)
Glucose-Capillary: 181 mg/dL — ABNORMAL HIGH (ref 70–99)
Glucose-Capillary: 137 mg/dL — ABNORMAL HIGH (ref 70–99)
Glucose-Capillary: 140 mg/dL — ABNORMAL HIGH (ref 70–99)

## 2013-07-25 LAB — COMPREHENSIVE METABOLIC PANEL
ALT: 12 U/L (ref 0–35)
AST: 22 U/L (ref 0–37)
Albumin: 3.1 g/dL — ABNORMAL LOW (ref 3.5–5.2)
Alkaline Phosphatase: 87 U/L (ref 39–117)
BILIRUBIN TOTAL: 0.3 mg/dL (ref 0.3–1.2)
BUN: 18 mg/dL (ref 6–23)
CALCIUM: 8.8 mg/dL (ref 8.4–10.5)
CO2: 24 meq/L (ref 19–32)
CREATININE: 0.94 mg/dL (ref 0.50–1.10)
Chloride: 97 mEq/L (ref 96–112)
GFR, EST AFRICAN AMERICAN: 68 mL/min — AB (ref >90)
GFR, EST NON AFRICAN AMERICAN: 59 mL/min — AB (ref >90)
GLUCOSE: 154 mg/dL — AB (ref 70–99)
Potassium: 3.8 mEq/L (ref 3.7–5.3)
Sodium: 136 mEq/L — ABNORMAL LOW (ref 137–147)
Total Protein: 6.9 g/dL (ref 6.0–8.3)

## 2013-07-25 LAB — INFLUENZA PANEL BY PCR (TYPE A & B)
H1N1 flu by pcr: NOT DETECTED
INFLAPCR: NEGATIVE
INFLBPCR: NEGATIVE

## 2013-07-25 LAB — CBC
HCT: 32.9 % — ABNORMAL LOW (ref 36.0–46.0)
HEMOGLOBIN: 11.5 g/dL — AB (ref 12.0–15.0)
MCH: 29.3 pg (ref 26.0–34.0)
MCHC: 35 g/dL (ref 30.0–36.0)
MCV: 83.7 fL (ref 78.0–100.0)
PLATELETS: 196 10*3/uL (ref 150–400)
RBC: 3.93 MIL/uL (ref 3.87–5.11)
RDW: 15.6 % — ABNORMAL HIGH (ref 11.5–15.5)
WBC: 9.3 10*3/uL (ref 4.0–10.5)

## 2013-07-25 LAB — TROPONIN I: Troponin I: <0.3 ng/mL (ref <0.30)

## 2013-07-25 LAB — MRSA PCR SCREENING: MRSA by PCR: NEGATIVE

## 2013-07-25 MED ORDER — LAMOTRIGINE 200 MG PO TABS
200.0000 mg | ORAL_TABLET | Freq: Two times a day (BID) | ORAL | Status: DC
  Administered 2013-07-25 – 2013-07-27 (×6): 200 mg via ORAL
  Filled 2013-07-25 (×7): qty 1

## 2013-07-25 MED ORDER — CARVEDILOL 12.5 MG PO TABS
12.5000 mg | ORAL_TABLET | Freq: Two times a day (BID) | ORAL | Status: DC
  Administered 2013-07-25 – 2013-07-27 (×6): 12.5 mg via ORAL
  Filled 2013-07-25 (×6): qty 1

## 2013-07-25 MED ORDER — ALUM & MAG HYDROXIDE-SIMETH 200-200-20 MG/5ML PO SUSP: 30.0000 mL | Freq: Four times a day (QID) | ORAL | Status: DC | PRN

## 2013-07-25 MED ORDER — PANTOPRAZOLE SODIUM 40 MG PO TBEC
40.0000 mg | DELAYED_RELEASE_TABLET | Freq: Every day | ORAL | Status: DC
  Administered 2013-07-25 – 2013-07-27 (×3): 40 mg via ORAL
  Filled 2013-07-25 (×3): qty 1

## 2013-07-25 MED ORDER — IRBESARTAN 300 MG PO TABS
300.0000 mg | ORAL_TABLET | Freq: Every day | ORAL | Status: DC
  Administered 2013-07-25 – 2013-07-27 (×3): 300 mg via ORAL
  Filled 2013-07-25 (×3): qty 1

## 2013-07-25 MED ORDER — DONEPEZIL HCL 10 MG PO TABS
10.0000 mg | ORAL_TABLET | Freq: Every day | ORAL | Status: DC
  Administered 2013-07-25 – 2013-07-27 (×3): 10 mg via ORAL
  Filled 2013-07-25 (×3): qty 1

## 2013-07-25 MED ORDER — POLYETHYLENE GLYCOL 3350 17 G PO PACK
17.0000 g | PACK | ORAL | Status: DC
  Administered 2013-07-25: 17 g via ORAL
  Filled 2013-07-25 (×2): qty 1

## 2013-07-25 MED ORDER — BENZTROPINE MESYLATE 1 MG PO TABS
1.0000 mg | ORAL_TABLET | Freq: Two times a day (BID) | ORAL | Status: DC
  Administered 2013-07-25 – 2013-07-27 (×6): 1 mg via ORAL
  Filled 2013-07-25 (×6): qty 1

## 2013-07-25 MED ORDER — IPRATROPIUM-ALBUTEROL 0.5-2.5 (3) MG/3ML IN SOLN
3.0000 mL | Freq: Four times a day (QID) | RESPIRATORY_TRACT | Status: DC
  Administered 2013-07-25 – 2013-07-27 (×8): 3 mL via RESPIRATORY_TRACT
  Filled 2013-07-25 (×10): qty 3

## 2013-07-25 MED ORDER — ATORVASTATIN CALCIUM 40 MG PO TABS
40.0000 mg | ORAL_TABLET | Freq: Every day | ORAL | Status: DC
  Administered 2013-07-25 – 2013-07-27 (×3): 40 mg via ORAL
  Filled 2013-07-25 (×3): qty 1

## 2013-07-25 MED ORDER — CLOPIDOGREL BISULFATE 75 MG PO TABS
75.0000 mg | ORAL_TABLET | Freq: Every day | ORAL | Status: DC
  Administered 2013-07-25 – 2013-07-27 (×3): 75 mg via ORAL
  Filled 2013-07-25 (×3): qty 1

## 2013-07-25 MED ORDER — SENNOSIDES-DOCUSATE SODIUM 8.6-50 MG PO TABS
1.0000 | ORAL_TABLET | Freq: Two times a day (BID) | ORAL | Status: DC
  Administered 2013-07-25 – 2013-07-26 (×4): 1 via ORAL
  Filled 2013-07-25 (×4): qty 1

## 2013-07-25 MED ORDER — BUDESONIDE-FORMOTEROL FUMARATE 160-4.5 MCG/ACT IN AERO
2.0000 | INHALATION_SPRAY | Freq: Two times a day (BID) | RESPIRATORY_TRACT | Status: DC
  Administered 2013-07-25 – 2013-07-27 (×4): 2 via RESPIRATORY_TRACT
  Filled 2013-07-25: qty 6

## 2013-07-25 MED ORDER — LORAZEPAM 1 MG PO TABS
1.0000 mg | ORAL_TABLET | Freq: Two times a day (BID) | ORAL | Status: DC | PRN
  Administered 2013-07-25 – 2013-07-27 (×4): 1 mg via ORAL
  Filled 2013-07-25 (×4): qty 1

## 2013-07-25 MED ORDER — QUETIAPINE FUMARATE 100 MG PO TABS
100.0000 mg | ORAL_TABLET | Freq: Two times a day (BID) | ORAL | Status: DC
  Administered 2013-07-25 – 2013-07-27 (×6): 100 mg via ORAL
  Filled 2013-07-25 (×7): qty 1

## 2013-07-25 MED ORDER — LORATADINE 10 MG PO TABS
10.0000 mg | ORAL_TABLET | Freq: Every day | ORAL | Status: DC
  Administered 2013-07-25 – 2013-07-27 (×3): 10 mg via ORAL
  Filled 2013-07-25 (×3): qty 1

## 2013-07-25 MED ORDER — INSULIN ASPART 100 UNIT/ML ~~LOC~~ SOLN
0.0000 [IU] | Freq: Three times a day (TID) | SUBCUTANEOUS | Status: DC
  Administered 2013-07-25 – 2013-07-27 (×3): 1 [IU] via SUBCUTANEOUS

## 2013-07-25 NOTE — H&P (Signed)
Internal Medicine Attending Admission Note Date: 07/25/2013  Patient name: Cindy Padilla Medical record number: [id number] Date of birth: 1940/11/23 Age: 73 y.o. Gender: female  I saw and evaluated the patient. I reviewed the resident's note and I agree with the resident's findings and plan as documented in the resident's note.  Cindy Padilla is a 73 year old woman with a history of dementia, grade 1 diastolic dysfunction, diabetes, bipolar disorder, and gastroesophageal reflux disease who presents to the emergency department complaining of shortness of breath and cough for the last several days. The patient, upon presentation, was somewhat confused and unable to provide a history. As best as can be piece together it appears she's been short of breath for several days with cough, post-tussive emesis, and wheezing. EMS reported that she was in significant respiratory distress when [facility] with respiratory rates approximately 50 and O2 sats approximately 90%. When she arrived to the emergency department she was placed on BiPAP and given Solu-Medrol and bronchodilators with improvement in her tachypnea. In the emergency department she was noted to have a fever of 103. She denies any chest pain or abdominal pain. She was admitted to the internal medicine teaching service for further evaluation and care.  Portable chest x-ray on admission revealed a poor inspiratory result making interpretation difficult. She has some bibasilar atelectasis, right greater than left, but igiven the poor inspiratory result there may be an underlying infiltrate present as well which is not yet appreciated. She rested well on the BiPAP therapy with the bronchodilators and antibiotics. Examination revealed bibasilar inspiratory rhonchi with decent air movement and minimal wheezing. She was talkative and alert and should be able to tolerate discontinuance of the BiPAP this  morning. If she tolerates the discontinuance of the BiPAP we will send her down for a PA and lateral chest x-ray to better define what may be occurring in her lungs. For the meantime we will continue empiric therapy for a healthcare associated pneumonia given her fever, productive cough, and shortness of breath. We will also watch her ins and outs closely and consider an additional dose of Lasix should we feel she is volume overloaded. Fortunately, she put out 1-1/2 L with the last dose of Lasix, so we know she is responsive to loop diuretic therapy. If she does well this morning I feel she is a candidate to be transferred to the general medical ward off of telemetry to continue therapy as outlined above.

## 2013-07-25 NOTE — Progress Notes (Addendum)
Subjective: Cindy Padilla is doing much better this morning.  Oriented x 3.  States she still feels congested but asking to eat.  Respiratory to come by and take patient off BiPAP.  Will transfer and order diet, home meds after.  Objective: Vital signs in last 24 hours: Filed Vitals:   07/25/13 0024 07/25/13 0339 07/25/13 0800 07/25/13 0810  BP: 108/45 129/52    Pulse:  50    Temp:  98.5 F (36.9 C) 98.4 F (36.9 C)   TempSrc:  Axillary Axillary   Resp:  13    Height:      Weight:      SpO2:  97%  98%   Weight change:   Intake/Output Summary (Last 24 hours) at 07/25/13 1057 Last data filed at 07/25/13 1026  Gross per 24 hour  Intake    243 ml  Output   1700 ml  Net  -1457 ml   PEX Exam limited as patient on BiPAP. General: alert, cooperative, and in no apparent distress HEENT: NCAT, vision grossly intact, oropharynx clear and non-erythematous  Neck: supple, no lymphadenopathy Lungs: bibasilar inspiratory rhonchi, diffuse expiratory wheezing, normal work of respiration, no rales Heart: regular rate and rhythm, no murmurs, gallops, or rubs Abdomen: soft, non-tender, non-distended, normal bowel sounds Extremities: 1+ BLE edema, 2+ DP/PT pulses bilaterally, no cyanosis, clubbing Neurologic: alert & oriented X3, cranial nerves II-XII intact, strength grossly intact, sensation intact to light touch  Lab Results: Basic Metabolic Panel:  Recent Labs Lab 07/24/13 1550 07/24/13 2059 07/25/13 0325  NA 132*  --  136*  K 4.9  --  3.8  CL 91*  --  97  CO2 25  --  24  GLUCOSE 120*  --  154*  BUN 15  --  18  CREATININE 0.88  --  0.94  CALCIUM 9.2  --  8.8  MG  --  1.9  --    Liver Function Tests:  Recent Labs Lab 07/24/13 1550 07/25/13 0325  AST 30 22  ALT 15 12  ALKPHOS 105 87  BILITOT 0.4 0.3  PROT 7.7 6.9  ALBUMIN 3.8 3.1*   CBC:  Recent Labs Lab 07/24/13 1550 07/25/13 0325  WBC 9.1 9.3  HGB 11.9* 11.5*  HCT 34.7* 32.9*  MCV 84.2 83.7  PLT 228  196   Cardiac Enzymes:  Recent Labs Lab 07/24/13 2059 07/25/13 0325 07/25/13 0815  TROPONINI <0.30 <0.30 <0.30   BNP:  Recent Labs Lab 07/24/13 1550  PROBNP 1247.0*   CBG:  Recent Labs Lab 07/24/13 2141 07/25/13 0755  GLUCAP 251* 162*   Coagulation:  Recent Labs Lab 07/24/13 2059  LABPROT 13.8  INR 1.08   Urine Drug Screen: Drugs of Abuse     Component Value Date/Time   LABOPIA NONE DETECTED 07/24/2013 2038   COCAINSCRNUR NONE DETECTED 07/24/2013 2038   LABBENZ NONE DETECTED 07/24/2013 2038   AMPHETMU NONE DETECTED 07/24/2013 2038   THCU NONE DETECTED 07/24/2013 2038   LABBARB NONE DETECTED 07/24/2013 2038   Urinalysis:  Recent Labs Lab 07/24/13 2038  COLORURINE YELLOW  LABSPEC 1.012  PHURINE 7.0  GLUCOSEU NEGATIVE  HGBUR TRACE*  BILIRUBINUR NEGATIVE  KETONESUR NEGATIVE  PROTEINUR NEGATIVE  UROBILINOGEN 0.2  NITRITE NEGATIVE  LEUKOCYTESUR NEGATIVE    Micro Results: Recent Results (from the past 240 hour(s))  MRSA PCR SCREENING     Status: None   Collection Time    07/25/13 12:07 AM      Result Value Ref Range  Status   MRSA by PCR NEGATIVE  NEGATIVE Final   Comment:            The GeneXpert MRSA Assay (FDA     approved for NASAL specimens     only), is one component of a     comprehensive MRSA colonization     surveillance program. It is not     intended to diagnose MRSA     infection nor to guide or     monitor treatment for     MRSA infections.   Studies/Results: Dg Chest Port 1 View  07/24/2013   CLINICAL DATA:  Shortness of breath, weakness  EXAM: PORTABLE CHEST - 1 VIEW  COMPARISON:  04/18/2013  FINDINGS: Cardiomegaly is noted. Elevation of the right hemidiaphragm again noted. Right basilar atelectasis or early infiltrate. No pulmonary edema.  IMPRESSION: Cardiomegaly. Mild elevation of the right hemidiaphragm again noted. Linear atelectasis or infiltrate right base. No pulmonary edema.   Electronically Signed   By: Lahoma Crocker M.D.    On: 07/24/2013 16:23   Medications: I have reviewed the patient's current medications. Scheduled Meds: . aztreonam  1 g Intravenous Q8H  . heparin  5,000 Units Subcutaneous 3 times per day  . ipratropium-albuterol  3 mL Nebulization Q4H  . pantoprazole  40 mg Oral Daily  . sodium chloride  3 mL Intravenous Q12H  . vancomycin  750 mg Intravenous Q12H   Continuous Infusions:  PRN Meds:.ondansetron (ZOFRAN) IV, ondansetron Assessment/Plan: #Sepsis likely due to HCAP- Patient [facility] with worsening cough, SOB, and fatigue.  She met 2/4 SIRS criteria on admission as she was febrile to 103 and tachypneic to 50s; no leukocytosis or tachycardia.  CXR showed linear atelectasis vs infiltrate in right base thus pulmonary source, most likely HCAP vs. Aspiration PNA (especially given dementia).   Patient does use 2L O2 at home.  pBNP stable.  Troponins x 3 negative.  Influenza panel negative.  UA negative.   -respiratory therapy to discontinue BiPAP then will order diet and home meds, place transfer orders -O2 by [place] to keep O2sat >92% -CXR 2 view once off BiPAP -continue vacomycin and Aztreonam per pharmacy given allergies -strep pneumo/legionella antigens pending -blood cultures x 2 pending -duonebs q6h  -BMP, CBC in AM   #?COPD/asthma- Patient is on 2L home O2.  Unable to locate PFTs.  Last hospitalization for COPD exacerbation 02/24/13. Unknown if patient has ever been intubated. She is followed by Dr. Melvyn Novas.  -plan per above   #Grade 1 diastolic dysfunction- TTE in 01/2013 showed an EF 55-60% with grade 1 diastolic dysfunction.  Patient appeared mildly hypervolemic on exam with BLE edema on presentation.  CXR is without effusions or vascular congestion.  BNP 1247, stable.  Received 1 dose of lasix IV 40 mg on 2/28.  -continue home meds  -will give additional Lasix if concern for volume overload -I/Os  -daily weights   #Hypertension- Stable -continue home meds    #Dementia -continue home meds   #Bipolar disorder -continue lamictal   Dispo: Disposition is deferred at this time, awaiting improvement of current medical problems.  Anticipated discharge in approximately 2-3 day(s).   The patient does have a current PCP Garwin Brothers, MD) and does need an [facility] hospital follow-up appointment after discharge.   .Services Needed at time of discharge: Y = Yes, Blank = No PT:   OT:   RN:   Equipment:   Other:     LOS: 1 day  Ivin Poot, MD 07/25/2013, 10:57 AM

## 2013-07-25 NOTE — Progress Notes (Signed)
Pt taken off Bipap and placed on 3L [place] at this time, Pt in no distress Sat 95%. RT will continue to monitor pt.

## 2013-07-25 NOTE — Progress Notes (Signed)
[facility] being [facility] per MD order. Report called to Randa EvensJoanne, Charity fundraiserN. Patient will transfer in wheel chair with O2, Alert and oriented and VSS.

## 2013-07-26 DIAGNOSIS — J441 Chronic obstructive pulmonary disease with (acute) exacerbation: Secondary | ICD-10-CM

## 2013-07-26 LAB — CBC
HEMATOCRIT: 28.5 % — AB (ref 36.0–46.0)
Hemoglobin: 9.9 g/dL — ABNORMAL LOW (ref 12.0–15.0)
MCH: 29.4 pg (ref 26.0–34.0)
MCHC: 34.7 g/dL (ref 30.0–36.0)
MCV: 84.6 fL (ref 78.0–100.0)
Platelets: 173 10*3/uL (ref 150–400)
RBC: 3.37 MIL/uL — ABNORMAL LOW (ref 3.87–5.11)
RDW: 15.7 % — AB (ref 11.5–15.5)
WBC: 5.8 10*3/uL (ref 4.0–10.5)

## 2013-07-26 LAB — URINE CULTURE
Colony Count: 45000
Special Requests: NORMAL

## 2013-07-26 LAB — GLUCOSE, CAPILLARY
GLUCOSE-CAPILLARY: 96 mg/dL (ref 70–99)
Glucose-Capillary: 117 mg/dL — ABNORMAL HIGH (ref 70–99)
Glucose-Capillary: 119 mg/dL — ABNORMAL HIGH (ref 70–99)
Glucose-Capillary: 89 mg/dL (ref 70–99)

## 2013-07-26 LAB — BASIC METABOLIC PANEL
BUN: 17 mg/dL (ref 6–23)
CALCIUM: 8.9 mg/dL (ref 8.4–10.5)
CO2: 24 mEq/L (ref 19–32)
CREATININE: 0.73 mg/dL (ref 0.50–1.10)
Chloride: 96 mEq/L (ref 96–112)
GFR calc non Af Amer: 83 mL/min — ABNORMAL LOW (ref >90)
Glucose, Bld: 122 mg/dL — ABNORMAL HIGH (ref 70–99)
Potassium: 3.5 mEq/L — ABNORMAL LOW (ref 3.7–5.3)
Sodium: 133 mEq/L — ABNORMAL LOW (ref 137–147)

## 2013-07-26 MED ORDER — LEVOFLOXACIN IN D5W 750 MG/150ML IV SOLN
750.0000 mg | INTRAVENOUS | Status: DC
  Administered 2013-07-26 – 2013-07-27 (×2): 750 mg via INTRAVENOUS
  Filled 2013-07-26 (×2): qty 150

## 2013-07-26 MED ORDER — GUAIFENESIN ER 600 MG PO TB12
600.0000 mg | ORAL_TABLET | Freq: Two times a day (BID) | ORAL | Status: DC
  Administered 2013-07-26 – 2013-07-27 (×4): 600 mg via ORAL
  Filled 2013-07-26 (×4): qty 1

## 2013-07-26 MED ORDER — PREDNISONE 20 MG PO TABS
40.0000 mg | ORAL_TABLET | Freq: Every day | ORAL | Status: DC
  Administered 2013-07-27: 40 mg via ORAL
  Filled 2013-07-26: qty 2

## 2013-07-26 MED ORDER — POTASSIUM CHLORIDE CRYS ER 20 MEQ PO TBCR
40.0000 meq | EXTENDED_RELEASE_TABLET | Freq: Once | ORAL | Status: AC
  Administered 2013-07-26: 40 meq via ORAL
  Filled 2013-07-26: qty 2

## 2013-07-26 MED ORDER — ALBUTEROL SULFATE (2.5 MG/3ML) 0.083% IN NEBU: 2.5000 mg | INHALATION_SOLUTION | RESPIRATORY_TRACT | Status: DC | PRN

## 2013-07-26 NOTE — Clinical Social Work Note (Signed)
CSW has touched base with admissions [facility]. CS[facility] has [facility] and informed admissions that pt could possibly be discharged on 07/27/2013. CSW to continue to follow for discharge planning needs.  Darlyn ChamberEmily [place], LCSWA Clinical Social Worker [phone]

## 2013-07-26 NOTE — Progress Notes (Signed)
Internal Medicine Attending  Date: 07/26/2013  Patient name: Cindy Padilla Medical record number: [id number] Date of birth: [birth year] Age: 73 y.o. Gender: female  I saw and evaluated the patient, and discussed her care on A.M rounds with housestaff.  I reviewed the resident's note by Dr. Aundria Rudogers and I agree with the resident's findings and plans as documented in her note.

## 2013-07-26 NOTE — Progress Notes (Signed)
Subjective: Cindy Padilla is doing better this morning.  Oriented x 3.  States her chest still feels "tight" but she is eating and drinking normally, willing to walk with nursing today.   Objective: Vital signs in last 24 hours: Filed Vitals:   07/25/13 1432 07/25/13 1448 07/25/13 2219 07/26/13 0528  BP: 148/66  152/54 140/47  Pulse: 66  61 50  Temp: 98.5 F (36.9 C)  98.2 F (36.8 C) 98 F (36.7 C)  TempSrc: Oral  Oral Oral  Resp: 18  18 18   Height:      Weight:      SpO2: 100% 97% 96% 96%   Weight change:   Intake/Output Summary (Last 24 hours) at 07/26/13 0809 Last data filed at 07/26/13 0400  Gross per 24 hour  Intake    723 ml  Output   1300 ml  Net   -577 ml   PEX General: alert, cooperative, and in no apparent distress HEENT: NCAT, vision grossly intact, oropharynx clear and non-erythematous  Neck: supple, no lymphadenopathy Lungs: diffuse expiratory wheezing R>L, normal work of respiration, no rales Heart: bradycardic, regular rate and rhythm, no murmurs, gallops, or rubs Abdomen: soft, non-tender, non-distended, normal bowel sounds Extremities: 1+ BLE edema, 2+ DP/PT pulses bilaterally, no cyanosis, clubbing Neurologic: alert & oriented X3, cranial nerves II-XII intact, strength grossly intact, sensation intact to light touch  Lab Results: Basic Metabolic Panel:  Recent Labs Lab 07/24/13 2059 07/25/13 0325 07/26/13 0545  NA  --  136* 133*  K  --  3.8 3.5*  CL  --  97 96  CO2  --  24 24  GLUCOSE  --  154* 122*  BUN  --  18 17  CREATININE  --  0.94 0.73  CALCIUM  --  8.8 8.9  MG 1.9  --   --    Liver Function Tests:  Recent Labs Lab 07/24/13 1550 07/25/13 0325  AST 30 22  ALT 15 12  ALKPHOS 105 87  BILITOT 0.4 0.3  PROT 7.7 6.9  ALBUMIN 3.8 3.1*   CBC:  Recent Labs Lab 07/25/13 0325 07/26/13 0545  WBC 9.3 5.8  HGB 11.5* 9.9*  HCT 32.9* 28.5*  MCV 83.7 84.6  PLT 196 173   Cardiac Enzymes:  Recent Labs Lab 07/24/13 2059  07/25/13 0325 07/25/13 0815  TROPONINI <0.30 <0.30 <0.30   BNP:  Recent Labs Lab 07/24/13 1550  PROBNP 1247.0*   CBG:  Recent Labs Lab 07/24/13 2141 07/25/13 0755 07/25/13 1130 07/25/13 1653 07/25/13 2223 07/26/13 0735  GLUCAP 251* 162* 181* 137* 140* 117*   Coagulation:  Recent Labs Lab 07/24/13 2059  LABPROT 13.8  INR 1.08   Urine Drug Screen: Drugs of Abuse     Component Value Date/Time   LABOPIA NONE DETECTED 07/24/2013 2038   COCAINSCRNUR NONE DETECTED 07/24/2013 2038   LABBENZ NONE DETECTED 07/24/2013 2038   AMPHETMU NONE DETECTED 07/24/2013 2038   THCU NONE DETECTED 07/24/2013 2038   LABBARB NONE DETECTED 07/24/2013 2038   Urinalysis:  Recent Labs Lab 07/24/13 2038  COLORURINE YELLOW  LABSPEC 1.012  PHURINE 7.0  GLUCOSEU NEGATIVE  HGBUR TRACE*  BILIRUBINUR NEGATIVE  KETONESUR NEGATIVE  PROTEINUR NEGATIVE  UROBILINOGEN 0.2  NITRITE NEGATIVE  LEUKOCYTESUR NEGATIVE    Micro Results: Recent Results (from the past 240 hour(s))  MRSA PCR SCREENING     Status: None   Collection Time    07/25/13 12:07 AM      Result Value Ref Range  Status   MRSA by PCR NEGATIVE  NEGATIVE Final   Comment:            The GeneXpert MRSA Assay (FDA     approved for NASAL specimens     only), is one component of a     comprehensive MRSA colonization     surveillance program. It is not     intended to diagnose MRSA     infection nor to guide or     monitor treatment for     MRSA infections.   Studies/Results: Dg Chest 2 View  07/25/2013   CLINICAL DATA:  Shortness of breath. Health care acquired pneumonia.  EXAM: CHEST  2 VIEW  COMPARISON:  DG CHEST 1V PORT dated 07/24/2013  FINDINGS: There is no airspace disease identified. The cardiopericardial silhouette is within normal limits for projection. Aortic arch atherosclerosis. Vagal nerve stimulator power pack present over the left chest with lead extending into the neck. Monitoring leads project over the chest.  Resolution of atelectasis at the right lung base.  IMPRESSION: No active cardiopulmonary disease.   Electronically Signed   By: Dereck Ligas M.D.   On: 07/25/2013 19:42   Dg Chest Port 1 View  07/24/2013   CLINICAL DATA:  Shortness of breath, weakness  EXAM: PORTABLE CHEST - 1 VIEW  COMPARISON:  04/18/2013  FINDINGS: Cardiomegaly is noted. Elevation of the right hemidiaphragm again noted. Right basilar atelectasis or early infiltrate. No pulmonary edema.  IMPRESSION: Cardiomegaly. Mild elevation of the right hemidiaphragm again noted. Linear atelectasis or infiltrate right base. No pulmonary edema.   Electronically Signed   By: Lahoma Crocker M.D.   On: 07/24/2013 16:23   Medications: I have reviewed the patient's current medications. Scheduled Meds: . atorvastatin  40 mg Oral Daily  . aztreonam  1 g Intravenous Q8H  . benztropine  1 mg Oral BID  . budesonide-formoterol  2 puff Inhalation BID  . carvedilol  12.5 mg Oral BID  . clopidogrel  75 mg Oral Daily  . donepezil  10 mg Oral QHS  . insulin aspart  0-9 Units Subcutaneous TID WC  . ipratropium-albuterol  3 mL Nebulization Q6H  . irbesartan  300 mg Oral Daily  . lamoTRIgine  200 mg Oral BID  . loratadine  10 mg Oral Daily  . pantoprazole  40 mg Oral Daily  . polyethylene glycol  17 g Oral QODAY  . QUEtiapine  100 mg Oral BID  . senna-docusate  1 tablet Oral BID  . sodium chloride  3 mL Intravenous Q12H  . vancomycin  750 mg Intravenous Q12H   Continuous Infusions:  PRN Meds:.alum & mag hydroxide-simeth, LORazepam, ondansetron (ZOFRAN) IV, ondansetron Assessment/Plan: #COPD exacerbation- Patient presented from [facility] with worsening cough, SOB, and fatigue.  Initially labeled as sepsis 2/2 HCAP PNA as she met 2/4 SIRS criteria on admission (febrile to 103 and tachypneic to 50s but no leukocytosis or tachycardia), and portable CXR showed linear atelectasis vs infiltrate in right base thus pulmonary source, most likely HCAP vs.  aspiration PNA.  However, patient has remained afebrile, and repeat 2 view chest xray negative.  Patient does use 2L O2 at home; no PFTs on record.  She has been satting in high 90s on 3L [place] since coming off BiPAP morning of 3/1.  Last hospitalization for COPD exacerbation on 02/24/2013, she follows with Dr. Melvyn Novas.  Other workup: pBNP stable.  Troponins x 3 negative.  Influenza panel negative.  UA negative.  Blood  cultures x 2 NGTD.  -levofloxacin 750 mg IV daily through discharge then will convert to PO -prednisone 40 mg daily, will taper at discharge -duonebs q6h, Symbicort -RN to ambulate with pulse ox -O2 by [place] to keep O2sat >92% -BMP, CBC in AM   #Grade 1 diastolic dysfunction- TTE in 01/2013 showed EF 55-60% with grade 1 diastolic dysfunction.  Patient appeared mildly hypervolemic on admission with BLE edema but appears euvolemic today.  Initial CXR without effusions or vascular congestion, repeat CXR negative as well.  BNP 1247, stable.  Received 1 dose of lasix IV 40 mg on 2/28, diuresed >1L.  -continue home meds (Coreg, irbesartan) -I/Os  -daily weights   #Hypertension- Stable -continue home meds, see above  #Dementia -continue donepezil  #Bipolar disorder -continue lamictal, Seroquel  Dispo: Disposition is deferred at this time, awaiting improvement of current medical problems.  Anticipated discharge tomorrow.   The patient does have a current PCP Garwin Brothers, MD) and does need an [facility] hospital follow-up appointment after discharge.   .Services Needed at time of discharge: Y = Yes, Blank = No PT:   OT:   RN:   Equipment:   Other:     LOS: 2 days   Ivin Poot, MD 07/26/2013, 8:09 AM

## 2013-07-26 NOTE — Clinical Social Work Psychosocial (Signed)
Clinical Social Work Department BRIEF PSYCHOSOCIAL ASSESSMENT 07/26/2013  Patient:  Cindy Padilla, Cindy Padilla     Account Number:  [id number]     Admit date:  07/24/2013  Clinical Social Worker:  Donna Christen  Date/Time:  07/26/2013 02:09 PM  Referred by:  Physician  Date Referred:  07/26/2013 Referred for  ALF Placement   Other Referral:   none.   Interview type:  Patient Other interview type:   none.    PSYCHOSOCIAL DATA Living Status:  FACILITY Admitted from facility:   Level of care:  Assisted Living Primary support name:  Manuela Schwartz Primary support relationship to patient:  CHILD, ADULT Degree of support available:   Adequate support system. Per pt, pt's daughter lives in [place] and does not visit often.    CURRENT CONCERNS Current Concerns  Post-Acute Placement   Other Concerns:   none.    SOCIAL WORK ASSESSMENT / PLAN CSW met with pt at bedside to discuss discharge planning needs. Pt was receptive to speaking with CSW. Per pt, she has been living at [facility] for two years. Pt informed CSW that she is "bi-polar" and does not particularly care to interact with others. Pt stated that she is still "getting used" to ALF, but is content with returning there at time of discharge. CSW to continue to follow and assist with discharge planning needs.   Assessment/plan status:  Psychosocial Support/Ongoing Assessment of Needs Other assessment/ plan:   none.   Information/referral to community resources:   Pt returning to ALF, [place].    PATIENTS/FAMILYS RESPONSE TO PLAN OF CARE: Pt is understanding and agreeable to CSW plan of care.       Pati Gallo, [place] Social Worker [phone]

## 2013-07-26 NOTE — Progress Notes (Signed)
Nutrition Brief Note  Patient identified on the Malnutrition Screening Tool (MST) Report  Wt Readings from Last 15 Encounters:  07/24/13 176 lb 5.9 oz (80 kg)  07/01/13 176 lb (79.833 kg)  04/16/13 175 lb (79.379 kg)  03/19/13 187 lb (84.823 kg)  02/24/13 182 lb 4.8 oz (82.691 kg)  02/09/13 183 lb 9.6 oz (83.28 kg)  06/18/11 181 lb (82.101 kg)  06/01/11 187 lb (84.823 kg)  01/07/11 183 lb (83.008 kg)    Body mass index is 34.44 kg/(m^2). Patient meets criteria for Obesity based on current BMI. Per weight history pt's weight has been stable for the past 3 months.   Current diet order is Carb Modified, patient is consuming approximately 100% of meals at this time. Pt states that due to being Bipolar she sometimes go through phases of not eating. Pt reports having a good appetite since admission.  Labs and medications reviewed.   No nutrition interventions warranted at this time. If nutrition issues arise, please consult RD.   Ian Malkineanne Barnett RD, LDN Inpatient Clinical Dietitian Pager: [phone] After Hours Pager: [phone]

## 2013-07-27 DIAGNOSIS — F0391 Unspecified dementia with behavioral disturbance: Secondary | ICD-10-CM | POA: Diagnosis present

## 2013-07-27 DIAGNOSIS — I5032 Chronic diastolic (congestive) heart failure: Secondary | ICD-10-CM | POA: Diagnosis present

## 2013-07-27 DIAGNOSIS — F03918 Unspecified dementia, unspecified severity, with other behavioral disturbance: Secondary | ICD-10-CM | POA: Diagnosis present

## 2013-07-27 LAB — GLUCOSE, CAPILLARY
GLUCOSE-CAPILLARY: 89 mg/dL (ref 70–99)
GLUCOSE-CAPILLARY: 142 mg/dL — AB (ref 70–99)
Glucose-Capillary: 127 mg/dL — ABNORMAL HIGH (ref 70–99)

## 2013-07-27 MED ORDER — PREDNISONE 10 MG PO TABS: 40.0000 mg | ORAL_TABLET | Freq: Every day | ORAL | Status: DC

## 2013-07-27 MED ORDER — IPRATROPIUM-ALBUTEROL 0.5-2.5 (3) MG/3ML IN SOLN: 3.0000 mL | Freq: Four times a day (QID) | RESPIRATORY_TRACT | Status: DC | PRN

## 2013-07-27 MED ORDER — FAMOTIDINE 10 MG PO TABS: 10.0000 mg | ORAL_TABLET | Freq: Every day | ORAL | Status: DC

## 2013-07-27 MED ORDER — LEVOFLOXACIN 750 MG PO TABS: 750.0000 mg | ORAL_TABLET | Freq: Every day | ORAL | Status: DC

## 2013-07-27 MED ORDER — BISOPROLOL FUMARATE 5 MG PO TABS: 2.5000 mg | ORAL_TABLET | Freq: Every day | ORAL | Status: DC

## 2013-07-27 NOTE — Clinical Social Work Note (Addendum)
CSW [facility]. CSW has [facility] message with [facility] stated that pt is ready to discharge on 07/27/2013. CSW awaiting call back for confirmation that pt is able to return.   CSW has completed discharge packet and ambulance form and placed on pt's shadow chart. CSW has spoken to RN. RN to call pt's daughter and inform her of pt's discharge. RN to also call MD regarding FL2 signature and hardscripts needed.  CSW has updated FL2 with discharge medications and [facility]. CSW has called for [facility] (ambulance). RN aware of information above.  RN to call [facility] at [phone]  Darlyn ChamberEmily [place], [facility] Clinical Social Worker [phone]

## 2013-07-27 NOTE — Discharge Summary (Signed)
Name: Cindy Padilla MRN: [id number] DOB: 09-Jan-1941 73 y.o. PCP: Garwin Brothers, MD  Date of Admission: 07/24/2013  3:26 PM Date of Discharge: 07/27/2013 Attending Physician: Axel Filler, MD  Discharge Diagnosis: Active Problems:   HTN (hypertension)   Acute respiratory failure   COPD with acute exacerbation   Diastolic heart failure   Dementia with behavioral disturbance  Discharge Medications:   Medication List    STOP taking these medications       carvedilol 12.5 MG tablet  Commonly known as:  COREG      TAKE these medications       albuterol (2.5 MG/3ML) 0.083% nebulizer solution  Commonly known as:  PROVENTIL  Take 2.5 mg by nebulization every 2 (two) hours as needed for wheezing or shortness of breath.     albuterol 108 (90 BASE) MCG/ACT inhaler  Commonly known as:  PROVENTIL HFA;VENTOLIN HFA  Inhale 2 puffs into the lungs every 4 (four) hours as needed for wheezing or shortness of breath.     antiseptic oral rinse Liqd  15 mLs by Mouth Rinse route at bedtime.     atorvastatin 40 MG tablet  Commonly known as:  LIPITOR  Take 40 mg by mouth daily.     benztropine 1 MG tablet  Commonly known as:  COGENTIN  Take 1 mg by mouth 2 (two) times daily.     budesonide-formoterol 160-4.5 MCG/ACT inhaler  Commonly known as:  SYMBICORT  Inhale 2 puffs into the lungs 2 (two) times daily. Rinse mouth after use     cholecalciferol 1000 UNITS tablet  Commonly known as:  VITAMIN D  Take 1,000 Units by mouth daily.     clopidogrel 75 MG tablet  Commonly known as:  PLAVIX  Take 75 mg by mouth daily.     donepezil 10 MG tablet  Commonly known as:  ARICEPT  Take 10 mg by mouth at bedtime.     famotidine 10 MG tablet  Commonly known as:  PEPCID  Take 1 tablet (10 mg total) by mouth at bedtime.     fluticasone 50 MCG/ACT nasal spray  Commonly known as:  FLONASE  Place 2 sprays into both nostrils daily.     GERI-LANTA 200-200-20 MG/5ML suspension  Generic  drug:  alum & mag hydroxide-simeth  Take 30 mLs by mouth See admin instructions. Give 1 hour after meals and at bedtime as needed for indigestion     lamoTRIgine 200 MG tablet  Commonly known as:  LAMICTAL  Take 200 mg by mouth 2 (two) times daily.     levofloxacin 750 MG tablet  Commonly known as:  LEVAQUIN  Take 1 tablet (750 mg total) by mouth daily.     loratadine 10 MG tablet  Commonly known as:  CLARITIN  Take 10 mg by mouth daily.     LORazepam 1 MG tablet  Commonly known as:  ATIVAN  Take 1 mg by mouth every 12 (twelve) hours.     magnesium oxide 400 MG tablet  Commonly known as:  MAG-OX  Take 400 mg by mouth daily.     pantoprazole 40 MG tablet  Commonly known as:  PROTONIX  Take 40 mg by mouth daily before breakfast.     polyethylene glycol packet  Commonly known as:  MIRALAX / GLYCOLAX  Take 17 g by mouth every other day. Dissolve in 6 oz of fluid and drink     predniSONE 10 MG tablet  Commonly known as:  DELTASONE  Take 4 tablets (40 mg total) by mouth daily with breakfast.     QUEtiapine 100 MG tablet  Commonly known as:  SEROQUEL  Take 100 mg by mouth 2 (two) times daily. Hold for sedation     senna-docusate 8.6-50 MG per tablet  Commonly known as:  Senokot-S  Take 1 tablet by mouth 2 (two) times daily.     valsartan 320 MG tablet  Commonly known as:  DIOVAN  Take 1 tablet (320 mg total) by mouth daily.     verapamil 120 MG CR tablet  Commonly known as:  CALAN-SR  Take 120 mg by mouth daily.        Disposition and follow-up:   Ms.Cindy Padilla was discharged from [facility] in Stable condition.  At the hospital follow up visit please address:  1.  Shortness of breath?, upper airway wheezing?  2.  Completion of antibiotic course and prednisone taper  3.  Labs / imaging needed at time of follow-up: none  4.  Pending labs/ test needing follow-up: none  Follow-up Appointments: Follow-up Information   Follow up with  Christinia Gully, MD On 08/02/2013. (10:30a)    Specialty:  Pulmonary Disease   Contact information:   [address] [place] [address] [phone]       Discharge Instructions:  Future Appointments Provider Department Dept Phone   08/02/2013 10:30 AM Tanda Rockers, MD [facility] [phone]   09/30/2013 10:30 AM Marcial Pacas, MD [facility] [phone]      Consultations:  none  Procedures Performed:  Dg Chest 2 View  07/25/2013   CLINICAL DATA:  Shortness of breath. Health care acquired pneumonia.  EXAM: CHEST  2 VIEW  COMPARISON:  DG CHEST 1V PORT dated 07/24/2013  FINDINGS: There is no airspace disease identified. The cardiopericardial silhouette is within normal limits for projection. Aortic arch atherosclerosis. Vagal nerve stimulator power pack present over the left chest with lead extending into the neck. Monitoring leads project over the chest. Resolution of atelectasis at the right lung base.  IMPRESSION: No active cardiopulmonary disease.   Electronically Signed   By: Dereck Ligas M.D.   On: 07/25/2013 19:42   Dg Chest Port 1 View  07/24/2013   CLINICAL DATA:  Shortness of breath, weakness  EXAM: PORTABLE CHEST - 1 VIEW  COMPARISON:  04/18/2013  FINDINGS: Cardiomegaly is noted. Elevation of the right hemidiaphragm again noted. Right basilar atelectasis or early infiltrate. No pulmonary edema.  IMPRESSION: Cardiomegaly. Mild elevation of the right hemidiaphragm again noted. Linear atelectasis or infiltrate right base. No pulmonary edema.   Electronically Signed   By: Lahoma Crocker M.D.   On: 07/24/2013 16:23    Admission HPI:  Pt is a 73 y.o. female with a PMH of ?asthma/COPD (04/16/13 saw Dr. Lanae Boast on 2L O2 _0 ) HTN, GERD/hiatal hernia, dementia, IBS, arthritis, bipolar 1 disorder; DMII, and chronic back pain. Pt presents to the ED from [facility] with SOB, cough, and fatigue for the past few days. The history was mainly obtained  from chart review due to patient being on BiPAP and slightly confused in the ED. She has been SOB for the past several days with significant cough and wheezing. She also had post-tussive emesis. According to EMS she was in significant distress with tachypnea (RR~50; initial O2 sats 90%RA), wheezing, and coarse rales and was placed on CPAP prior to arrival. In the ED she was placed on BiPAP and  given 1 dose 182m of solumedrol with duonebs and improvement in her breathing and RR decreased to ~30. She was hypertensive at 180/94 with HR~70. She denied any CP or abdominal pain.  Of note, 02/24/2013 was discharged for acute respiratory failure thought to be due to acute dCHF/ACOPDE. CXR at that time did not reveal obvious infiltrate and improved on oral lasix. She was also placed on prednisone taper, duonebs, symbicort, and lasix at discharge.    Hospital Course by problem list: 1. [facility]- [facility].  Initially labeled as sepsis 2/2 HCAP as she met 2/4 SIRS criteria on admission (febrile to 103 and tachypneic to 50s but no leukocytosis or tachycardia), and portable CXR showed linear atelectasis vs infiltrate in right base thus pulmonary source, most likely HCAP vs. aspiration PNA.  Patient was started on vancomycin and Aztreonam given medication allergies, and these were continued through 3/3.  However, patient remained afebrile after isolated febrile temperature in ED, and repeat 2 view chest xray on 3/2 negative thus COPD exacerbation seemed to be more likely diagnosis.  She was thus converted to levofloxacin on 3/3 and discharged with additional 3 days of PO levofloxacin; she was also started on oral prednisone taper on 3/3 (40, 40, 20, 20, 10, 10).  She was given duonebs q6h and continued home Symbicort as well.  Patient uses 2L O2 at home. She was initially placed on BiPAP in ED given her significant tachypnea but satting in high 90s  on 2-3L [place] since coming off BiPAP morning of 3/1 (in fact, she was satting in low 90s on RA even with ambulation but more comfortable with supplemental O2 so left 2L in place).  Continued [facility]L O2 [place] [facility].  Last hospitalization for COPD exacerbation on 02/24/2013; patient follows with Dr. WMelvyn Novas  Spoke to Dr. WMelvyn Novason phone, and he had several recommendations especially for patient's prominent upper airway wheezing.  He recommended ordering flutter valve (which was delivered prior to discharge), replacing current Coreg 12.5 mg BID with bisoprolol 2.5 mg daily (equivalent dose), adding Pepcid in PM to treat reflux (patient already on Protonix in AM).  These medication changes were made at discharge and close follow-up with Dr. WMelvyn Novasarranged.  Other workup: pBNP stable. Troponins x 3 negative. Influenza panel negative. UA negative. Blood cultures x 2 NGTD.   2. Grade 1 diastolic dysfunction- TTE in 01/2013 showed EF 55-60% with grade 1 diastolic dysfunction.  Patient appeared mildly hypervolemic on admission with BLE edema; she was given Lasix IV 40 mg at that time and diuresed >1L.  However, she appeared euvolemic after that, no diuretics above home dose given.  Of note, initial CXR without effusions or vascular congestion, repeat CXR negative as well. BNP 1247, stable.  Continued home meds Coreg, irbesartan while inpatient though changed Coreg to bisoprolol 2.5 mg daily at discharge per Dr. WGustavus Bryantrecommendation (see above).   3. Hypertension- Stable.  Continued home meds while inpatient and at discharge, see above.  4. Dementia with bipolar disorder- Continued home meds donepezil, lamictal, Seroquel while inpatient and at discharge.    Discharge Vitals:   BP 151/74  Pulse 54  Temp(Src) 98.2 F (36.8 C) (Oral)  Resp 16  Ht 5' (1.524 m)  Wt 176 lb 5.9 oz (80 kg)  BMI 34.44 kg/m2  SpO2 97%  Discharge Labs:  Results for orders placed during the hospital  encounter of 07/24/13 (from  the past 24 hour(s))  GLUCOSE, CAPILLARY     Status: None   Collection Time    07/26/13  4:57 PM      Result Value Ref Range   Glucose-Capillary 89  70 - 99 mg/dL  GLUCOSE, CAPILLARY     Status: None   Collection Time    07/26/13  9:25 PM      Result Value Ref Range   Glucose-Capillary 96  70 - 99 mg/dL    Signed: Ivin Poot, MD 07/27/2013, 11:52 AM   Time Spent on Discharge: 35 minutes Services Ordered on Discharge: SNF Equipment Ordered on Discharge: flutter valve

## 2013-07-27 NOTE — Progress Notes (Signed)
Subjective: [name] is doing better again this morning.  Still feels mildly SOB though she is eating and drinking normally, walked with nursing yesterday without difficulty, willing to work with PT this morning in preparation for discharge.   RN reported that patient's O2 sats 88-93% on RA with ambulation yesterday but do not see documentation of this in chart so have asked to repeat with documentation today.   Objective: Vital signs in last 24 hours: Filed Vitals:   07/26/13 0934 07/26/13 1354 07/26/13 2126 07/27/13 0532  BP:  140/66 139/47 151/74  Pulse:  88 68 54  Temp:  98.6 F (37 C) 98.4 F (36.9 C) 98.2 F (36.8 C)  TempSrc:  Oral Oral Oral  Resp:  _0 Height:      Weight:      SpO2: 97% 94% 99% 100%   Weight change:   Intake/Output Summary (Last 24 hours) at 07/27/13 0732 Last data filed at 07/27/13 0536  Gross per 24 hour  Intake    840 ml  Output    550 ml  Net    290 ml   PEX General: alert, cooperative, and in no apparent distress HEENT: NCAT, vision grossly intact, oropharynx clear and non-erythematous  Neck: supple, no lymphadenopathy Lungs: minimal expiratory wheezing (much improved from yesterday) though still with audible upper airway "wheeze", normal work of respiration, no rhonchi or rales Heart: bradycardic, regular rate and rhythm, no murmurs, gallops, or rubs Abdomen: soft, non-tender, non-distended, normal bowel sounds Extremities: 1+ BLE edema, 2+ DP/PT pulses bilaterally, no cyanosis, clubbing Neurologic: alert & oriented X3, cranial nerves II-XII intact, strength grossly intact, sensation intact to light touch  Lab Results: Basic Metabolic Panel:  Recent Labs Lab 07/24/13 2059 07/25/13 0325 07/26/13 0545  NA  --  136* 133*  K  --  3.8 3.5*  CL  --  97 96  CO2  --  24 24  GLUCOSE  --  154* 122*  BUN  --  18 17  CREATININE  --  0.94 0.73  CALCIUM  --  8.8 8.9  MG 1.9  --   --    Liver Function Tests:  Recent Labs Lab  07/24/13 1550 07/25/13 0325  AST 30 22  ALT 15 12  ALKPHOS 105 87  BILITOT 0.4 0.3  PROT 7.7 6.9  ALBUMIN 3.8 3.1*   CBC:  Recent Labs Lab 07/25/13 0325 07/26/13 0545  WBC 9.3 5.8  HGB 11.5* 9.9*  HCT 32.9* 28.5*  MCV 83.7 84.6  PLT 196 173   Cardiac Enzymes:  Recent Labs Lab 07/24/13 2059 07/25/13 0325 07/25/13 0815  TROPONINI <0.30 <0.30 <0.30   BNP:  Recent Labs Lab 07/24/13 1550  PROBNP 1247.0*   CBG:  Recent Labs Lab 07/25/13 1653 07/25/13 2223 07/26/13 0735 07/26/13 1138 07/26/13 1657 07/26/13 2125  GLUCAP 137* 140* 117* 119* 89 96   Coagulation:  Recent Labs Lab 07/24/13 2059  LABPROT 13.8  INR 1.08   Urine Drug Screen: Drugs of Abuse     Component Value Date/Time   LABOPIA NONE DETECTED 07/24/2013 2038   COCAINSCRNUR NONE DETECTED 07/24/2013 2038   LABBENZ NONE DETECTED 07/24/2013 2038   AMPHETMU NONE DETECTED 07/24/2013 2038   THCU NONE DETECTED 07/24/2013 2038   LABBARB NONE DETECTED 07/24/2013 2038   Urinalysis:  Recent Labs Lab 07/24/13 2038  COLORURINE YELLOW  LABSPEC 1.012  PHURINE 7.0  GLUCOSEU NEGATIVE  HGBUR TRACE*  BILIRUBINUR NEGATIVE  KETONESUR NEGATIVE  PROTEINUR  NEGATIVE  UROBILINOGEN 0.2  NITRITE NEGATIVE  LEUKOCYTESUR NEGATIVE    Micro Results: Recent Results (from the past 240 hour(s))  CULTURE, BLOOD (ROUTINE X 2)     Status: None   Collection Time    07/24/13  4:11 PM      Result Value Ref Range Status   Specimen Description BLOOD RIGHT WRIST   Final   Special Requests BOTTLES DRAWN AEROBIC AND ANAEROBIC 5CC   Final   Culture  Setup Time     Final   Value: 07/25/2013 02:57     Performed at [facility]   Culture     Final   Value:        BLOOD CULTURE RECEIVED NO GROWTH TO DATE CULTURE WILL BE HELD FOR 5 DAYS BEFORE ISSUING A FINAL NEGATIVE REPORT     Performed at [facility]   Report Status PENDING   Incomplete  CULTURE, BLOOD (ROUTINE X 2)     Status: None   Collection Time      07/24/13  6:00 PM      Result Value Ref Range Status   Specimen Description BLOOD LEFT HAND   Final   Special Requests BOTTLES DRAWN AEROBIC AND ANAEROBIC 5CC   Final   Culture  Setup Time     Final   Value: 07/25/2013 02:57     Performed at [facility]   Culture     Final   Value:        BLOOD CULTURE RECEIVED NO GROWTH TO DATE CULTURE WILL BE HELD FOR 5 DAYS BEFORE ISSUING A FINAL NEGATIVE REPORT     Performed at [facility]   Report Status PENDING   Incomplete  URINE CULTURE     Status: None   Collection Time    07/24/13  8:38 PM      Result Value Ref Range Status   Specimen Description URINE, RANDOM   Final   Special Requests Normal   Final   Culture  Setup Time     Final   Value: 07/25/2013 03:13     Performed at [facility] Count     Final   Value: 45,000 COLONIES/ML     Performed at [facility]   Culture     Final   Value: Multiple bacterial morphotypes present, none predominant. Suggest appropriate recollection if clinically indicated.     Performed at [facility]   Report Status 07/26/2013 FINAL   Final  MRSA PCR SCREENING     Status: None   Collection Time    07/25/13 12:07 AM      Result Value Ref Range Status   MRSA by PCR NEGATIVE  NEGATIVE Final   Comment:            The GeneXpert MRSA Assay (FDA     approved for NASAL specimens     only), is one component of a     comprehensive MRSA colonization     surveillance program. It is not     intended to diagnose MRSA     infection nor to guide or     monitor treatment for     MRSA infections.   Studies/Results: Dg Chest 2 View  07/25/2013   CLINICAL DATA:  Shortness of breath. Health care acquired pneumonia.  EXAM: CHEST  2 VIEW  COMPARISON:  DG CHEST 1V PORT dated 07/24/2013  FINDINGS: There is no airspace disease identified. The cardiopericardial  silhouette is within normal limits for projection. Aortic arch atherosclerosis. Vagal nerve stimulator power  pack present over the left chest with lead extending into the neck. Monitoring leads project over the chest. Resolution of atelectasis at the right lung base.  IMPRESSION: No active cardiopulmonary disease.   Electronically Signed   By: Dereck Ligas M.D.   On: 07/25/2013 19:42   Medications: I have reviewed the patient's current medications. Scheduled Meds: . atorvastatin  40 mg Oral Daily  . benztropine  1 mg Oral BID  . budesonide-formoterol  2 puff Inhalation BID  . carvedilol  12.5 mg Oral BID  . clopidogrel  75 mg Oral Daily  . donepezil  10 mg Oral QHS  . guaiFENesin  600 mg Oral BID  . insulin aspart  0-9 Units Subcutaneous TID WC  . ipratropium-albuterol  3 mL Nebulization Q6H  . irbesartan  300 mg Oral Daily  . lamoTRIgine  200 mg Oral BID  . levofloxacin (LEVAQUIN) IV  750 mg Intravenous Q24H  . loratadine  10 mg Oral Daily  . pantoprazole  40 mg Oral Daily  . polyethylene glycol  17 g Oral QODAY  . predniSONE  40 mg Oral Q breakfast  . QUEtiapine  100 mg Oral BID  . senna-docusate  1 tablet Oral BID  . sodium chloride  3 mL Intravenous Q12H   Continuous Infusions:  PRN Meds:.albuterol, alum & mag hydroxide-simeth, LORazepam, ondansetron (ZOFRAN) IV, ondansetron Assessment/Plan: #COPD exacerbation- Patient presented from [facility] with worsening cough, SOB, and fatigue.  Initially labeled as sepsis 2/2 HCAP PNA as she met 2/4 SIRS criteria on admission (febrile to 103 and tachypneic to 50s but no leukocytosis or tachycardia), and portable CXR showed linear atelectasis vs infiltrate in right base thus pulmonary source, most likely HCAP vs. aspiration PNA.  However, patient has remained afebrile, and repeat 2 view chest xray on 3/2 negative.  Patient does use 2L O2 at home.  She has been satting in high 90s on 2-3L [place] since coming off BiPAP morning of 3/1; satting in low 90s on RA but more comfortable with supplemental O2.  Have asked PT to perform pulse ox with ambulation  but will likely discharge patient on 2L O2 as she was using prior to admission and let SNF titrate as necessary.   Last hospitalization for COPD exacerbation on 02/24/2013, and she follows with Dr. Melvyn Novas.  Spoke to Dr. Melvyn Novas on phone this morning who had several recommendations especially for patient's prominent upper airway wheezing.  He recommended ordering flutter valve, replacing current Coreg 12.5 mg BID with bisoprolol 2.5 mg daily (equivalent dose), adding omeprazole in AM and Pepcid in PM to treat reflux; follow-up in 1 week with Dr. Melvyn Novas scheduled.  Will make these med changes at discharge. Other workup: pBNP stable.  Troponins x 3 negative.  Influenza panel negative.  UA negative.  Blood cultures x 2 NGTD.  -levofloxacin 750 mg IV daily through discharge then will convert to PO for additional 3 days  -prednisone 40 mg daily, will taper at discharge (40 mg tomorrow then 20, 20, 10, 10, stop) -duonebs q6h prn through discharge, Symbicort -flutter valve -PT to ambulate with pulse ox -O2 by [place] to keep O2sat >92%  #Grade 1 diastolic dysfunction- TTE in 01/2013 showed EF 55-60% with grade 1 diastolic dysfunction.  Patient appeared mildly hypervolemic on admission with BLE edema but appears euvolemic again today.  Initial CXR without effusions or vascular congestion, repeat CXR negative as well.  BNP 1247, stable.  Received 1 dose of lasix IV 40 mg on 2/28, diuresed >1L.  -continue home meds (Coreg, irbesartan); changing from Coreg to bisoprolol at discharge (see above) -I/Os  -daily weights   #Hypertension- Stable -continue home meds, see above  #Dementia -continue donepezil  #Bipolar disorder -continue lamictal, Seroquel  Dispo: Anticipated discharge today.   The patient does have a current PCP Garwin Brothers, MD) and does need an [facility] hospital follow-up appointment after discharge.  Patient now managed by doctor at her facility, also arranged follow-up appointment with Dr. Melvyn Novas (pulmonology)  on 3/9 at 10:30a.   .Services Needed at time of discharge: Y = Yes, Blank = No PT:   OT:   RN:   Equipment:   Other:     LOS: 3 days   Ivin Poot, MD 07/27/2013, 7:32 AM

## 2013-07-27 NOTE — Progress Notes (Signed)
Patient's O2 was removed for 15 minutes before ambulating in the hallway. Upon return to her room, her oxygen level was 91% on RA, Respirations 24 and HR 78. 2L O2 was placed back on the patient to help her calm her breathing. Will continue to monitor.

## 2013-07-27 NOTE — Progress Notes (Signed)
Internal Medicine Attending  Date: 07/27/2013  Patient name: Cindy Padilla Medical record number: [id number] Date of birth: November 27, 1940 Age: 73 y.o. Gender: female  I saw and evaluated the patient, and discussed her care on A.M rounds with housestaff.  I reviewed the resident's note by Dr. Aundria Rudogers and I agree with the resident's findings and plans as documented in her note.

## 2013-07-27 NOTE — Discharge Instructions (Signed)
Please complete your course of antibiotics and prednisone taper.   Chronic Obstructive Pulmonary Disease Chronic obstructive pulmonary disease (COPD) is a common lung problem. In COPD, the flow of air from the lungs is limited. The way your lungs work will probably never return to normal, but there are things you can do to improve you lungs and make yourself feel better. HOME CARE  Take all medicines as told by your doctor.  Only take over-the-counter or prescription medicines as told by your doctor.  Avoid medicines or cough syrups that dry up your airway (such as antihistamines) and do not allow you to get rid of thick spit. You do not need to avoid them if told differently by your doctor.  If you smoke, stop. Smoking makes the problem worse.  Avoid being around things that make your breathing worse (like smoke, chemicals, and fumes).  Use oxygen therapy and therapy to help improve your lungs (pulmonary rehabilitation) if told by your doctor. If you need home oxygen therapy, ask your doctor if you should buy a tool to measure your oxygen level (oximeter).  Avoid people who have a sickness you can catch (contagious).  Avoid going outside when it is very hot, cold, or humid.  Eat healthy foods. Eat smaller meals more often. Rest before meals.  Stay active, but remember to also rest.  Make sure to get all the shots (vaccines) your doctor recommends. Ask your doctor if you need a pneumonia shot.  Learn and use tips on how to relax.  Learn and use tips on how to control your breathing as told by your doctor. Try:  Breathing in (inhaling) through your nose for 1 second. Then, pucker your lips and breath out (exhale) through your lips for 2 seconds.  Putting one hand on your belly (abdomen). Breathe in slowly through your nose for 1 second. Your hand on your belly should move out. Pucker your lips and breathe out slowly through your lips. Your hand on your belly should move in as you  breathe out.  Learn and use controlled coughing to clear thick spit from your lungs. 1. Lean your head a little forward. 2. Breathe in deeply. 3. Try to hold your breath for 3 seconds. 4. Keep your mouth slightly open while coughing 2 times. 5. Spit any thick spit out into a tissue. 6. Rest and do the steps again 1 or 2 times as needed. GET HELP IF:  You cough up more thick spit than usual.  There is a change in the color or thickness of the spit.  It is harder to breathe than usual.  Your breathing is faster than usual. GET HELP RIGHT AWAY IF:   You have shortness of breath while resting.  You have shortness of breath that stops you from:  Being able to talk.  Doing normal activities.  You chest hurts for longer than 5 minutes.  Your skin color is more blue than usual.  Your pulse oximeter shows that you have low oxygen for longer than 5 minutes. MAKE SURE YOU:   Understand these instructions.  Will watch your condition.  Will get help right away if you are not doing well or get worse. Document Released: 10/30/2007 Document Revised: 03/03/2013 Document Reviewed: 01/07/2013 [facility] Patient Information 2014 [employer] [place].

## 2013-07-27 NOTE — Progress Notes (Signed)
Patient discharged back to facility via EMS. O2 on. Report called earlier. Update given to transporter re: HS meds given per patient's request.

## 2013-07-27 NOTE — Progress Notes (Signed)
RN spoke with the daughter and made her aware of the discharge back to the facility today. Dr. Aundria Rudogers was contacted and made aware of the signature need for FL2. She stated that the patient will be returning with oxygen. [facility] and a brief report.

## 2013-07-27 NOTE — Progress Notes (Signed)
OT Cancellation Note  Patient Details Name: Cindy Padilla MRN: [id number] DOB: 31-Dec-1940   Cancelled Treatment:     Reason OT eval not completed: Orders received, chart review complete. Discussion w/ PT today revealed that pt is most likely at baseline level for ADL's and self care tasks. PT reports Mod I toilet transfers and pt has bathing/dressing assist at ALF. Pt plans to d/c back to same ALF. Will sign off acute OT at this time.  Roselie AwkwardBarnhill, Amy Beth Dixon 07/27/2013, 11:59 AM

## 2013-07-27 NOTE — Evaluation (Signed)
Physical Therapy Evaluation Patient Details Name: Cindy Padilla MRN: [id number] DOB: July 24, 1940 Today's Date: 07/27/2013 Time: [id number] PT Time Calculation (min): 22 min  PT Assessment / Plan / Recommendation History of Present Illness  pt rpesents with COPD exacerbation.    Clinical Impression  Pt appears to be near baseline level of A and was able to tell this PT about the A she receives with bathing and dressing at ALF.  At this point pt is able to return to ALF, but would benefit from HHPT to A with balance and activity tolerance.      PT Assessment  Patient needs continued PT services    Follow Up Recommendations  Home health PT;Supervision - Intermittent    Does the patient have the potential to tolerate intense rehabilitation      Barriers to Discharge        Equipment Recommendations  None recommended by PT    Recommendations for Other Services     Frequency Min 3X/week    Precautions / Restrictions Precautions Precautions: Fall Restrictions Weight Bearing Restrictions: No   Pertinent Vitals/Pain Denied pain.        Mobility  Bed Mobility Overal bed mobility: Modified Independent Transfers Overall transfer level: Modified independent Equipment used: Rolling walker (2 wheeled) Ambulation/Gait Ambulation/Gait assistance: Supervision Ambulation Distance (Feet): 120 Feet Assistive device: Rolling walker (2 wheeled) Gait Pattern/deviations: Step-through pattern;Decreased stride length Gait velocity interpretation: Below normal speed for age/gender General Gait Details: pt becomes SOB and fatigued from ambulation while on 2L O2.      Exercises     PT Diagnosis: Difficulty walking  PT Problem List: Decreased activity tolerance;Decreased balance;Decreased mobility;Decreased knowledge of use of DME;Decreased safety awareness;Decreased cognition;Cardiopulmonary status limiting activity PT Treatment Interventions: DME instruction;Gait training;Functional  mobility training;Therapeutic activities;Therapeutic exercise;Balance training;Patient/family education     PT Goals(Current goals can be found in the care plan section) Acute Rehab PT Goals Patient Stated Goal: None stated PT Goal Formulation: With patient Time For Goal Achievement: 08/10/13 Potential to Achieve Goals: Fair  Visit Information  Last PT Received On: 07/27/13 Assistance Needed: +1 History of Present Illness: pt rpesents with COPD exacerbation.         Prior Functioning  Home Living Family/patient expects to be discharged to:: Assisted living Home Equipment: Walker - 2 wheels Prior Function Level of Independence: Needs assistance Gait / Transfers Assistance Needed: modified ind with walker ADL's / Homemaking Assistance Needed: assist with showers Communication Communication: No difficulties    Cognition  Cognition Arousal/Alertness: Awake/alert Behavior During Therapy: WFL for tasks assessed/performed Overall Cognitive Status: History of cognitive impairments - at baseline    Extremity/Trunk Assessment Upper Extremity Assessment Upper Extremity Assessment: Overall WFL for tasks assessed Lower Extremity Assessment Lower Extremity Assessment: Overall WFL for tasks assessed   Balance    End of Session PT - End of Session Equipment Utilized During Treatment: Gait belt Activity Tolerance: Patient limited by fatigue Patient left: in bed;with call bell/phone within reach;with bed alarm set Nurse Communication: Mobility status  GP     Sunny SchleinRitenour, Leshawn Houseworth F, [place] CarolinaPT [phone] 07/27/2013, 12:06 PM

## 2013-07-31 LAB — CULTURE, BLOOD (ROUTINE X 2)
Culture: NO GROWTH
Culture: NO GROWTH

## 2013-08-02 ENCOUNTER — Inpatient Hospital Stay: Payer: Self-pay | Admitting: Internal Medicine

## 2013-08-05 ENCOUNTER — Encounter: Payer: Self-pay | Admitting: Internal Medicine

## 2013-08-05 ENCOUNTER — Ambulatory Visit (INDEPENDENT_AMBULATORY_CARE_PROVIDER_SITE_OTHER): Payer: Medicare Other | Admitting: Internal Medicine

## 2013-08-05 VITALS — BP 132/80 | HR 53 | Temp 98.2°F | Ht 60.0 in | Wt 173.0 lb

## 2013-08-05 DIAGNOSIS — J45909 Unspecified asthma, uncomplicated: Secondary | ICD-10-CM

## 2013-08-05 DIAGNOSIS — J9600 Acute respiratory failure, unspecified whether with hypoxia or hypercapnia: Secondary | ICD-10-CM

## 2013-08-05 NOTE — Patient Instructions (Addendum)
No change in your medications - be sure you don't take corevidol (coreg) which interferes with your symbicort  Pulmonary follow up is as needed

## 2013-08-05 NOTE — Assessment & Plan Note (Signed)
This problem has resolved as she is fine on RA - ok to wean off 02 completely for sats > 90% daytime - would do ono RA before d/c noct 02

## 2013-08-05 NOTE — Assessment & Plan Note (Signed)
She does not have copd based on nl f/v loop 03/2013 but could have mild asthma complicated by use of coreg or cardiac asthma from diastolic chf  The proper method of use, as well as anticipated side effects, of a metered-dose inhaler are discussed and demonstrated to the patient. Improved effectiveness after extensive coaching during this visit to a level of approximately  50% so ok to continue symbicort for now but may be able to wean off now and see if flares.  Pulmonary f/u is prn as she is being seen weekly by health care providers in [place]

## 2013-08-05 NOTE — Progress Notes (Signed)
Subjective:    Patient ID: Cindy Padilla, female    DOB: Apr 27, 1941   MRN: [id number]    Brief patient profile:  [age] yowf smoked only as teenager but carries dx asthma/ copd referred 04/16/2013 to pulmonary clinic for eval of sob by EDP but proved to have nl f/v loop 04/16/13   History of Present Illness  04/16/2013 1st [facility] office visit/ Wert cc daily symbicort and acei and prn duoneb for at least a few years [facility] Constant sense of  nose drainage and mostly dry cough. Much more sob with exp to cleaning solutions  rec Stop lisinopril Start Diovan 320 mg one daily in its place Continue the symbicort 2 every 12 hours for now  If breathing problems continue stop coreg and use bisoprolol 5 mg in its place  Date of Admission: 07/24/2013 3:26 PM  Date of Discharge: 07/27/2013  Attending Physician: Farley LyJerry Dale Joines, MD  Discharge Diagnosis:  Active Problems:  HTN (hypertension) > stopped coreg/ replaced with bisoprolol  Acute respiratory failure  COPD with acute exacerbation  Diastolic heart failure  Dementia with behavioral disturbance    08/05/2013 post hosp f/u/Wert re: ? asthma Chief Complaint  Patient presents with  . HFU    Pt states that her wheezing has resolved. Her breathing is improving, but not quite back at baseline.      Was actually doing better p last ov until acutely deteriorating > admit as above.  Back near baseline now off coreg with minimal need for saba despite poor hfa technique/symbicort use  No obvious day to day or daytime variabilty or assoc chronic cough or cp or chest tightness, subjective wheeze overt sinus or hb symptoms. No unusual exp hx or h/o childhood pna/ asthma or knowledge of premature birth.  Sleeping ok without nocturnal  or early am exacerbation  of respiratory  c/o's or need for noct saba. Also denies any obvious fluctuation of symptoms with weather or environmental changes or other aggravating  or alleviating factors except as outlined above   Current Medications, Allergies, Complete Past Medical History, Past Surgical History, Family History, and Social History were [facility].  ROS  The following are not active complaints unless bolded sore throat, dysphagia, dental problems, itching, sneezing,  nasal congestion or excess/ purulent secretions, ear ache,   fever, chills, sweats, unintended wt loss, pleuritic or exertional cp, hemoptysis,  orthopnea pnd or leg swelling, presyncope, palpitations, heartburn, abdominal pain, anorexia, nausea, vomiting, diarrhea  or change in bowel or urinary habits, change in stools or urine, dysuria,hematuria,  rash, arthralgias, visual complaints, headache, numbness weakness or ataxia or problems with walking or coordination,  change in mood/affect or memory.                  Objective:   Physical Exam  amb elderly wf with resting tremor and abn affect / sats ok at rest RA  08/05/2013        173  Wt Readings from Last 3 Encounters:  04/16/13 175 lb (79.379 kg)  03/19/13 187 lb (84.823 kg)  02/24/13 182 lb 4.8 oz (82.691 kg)      HEENT: nl dentition, turbinates, and orophanx. Nl external ear canals without cough reflex   NECK :  without JVD/Nodes/TM/ nl carotid upstrokes bilaterally   LUNGS: no acc muscle use, clear to A and P bilaterally without cough on insp or exp maneuvers   CV:  RRR  no s3 or murmur or increase in P2, no edema   ABD:  soft and nontender with nl excursion in the supine position. No bruits or organomegaly, bowel sounds nl  MS:  warm without deformities, calf tenderness, cyanosis or clubbing  SKIN: warm and dry without lesions         cxr 04/11/13 Cardiomegaly without cardiopulmonary disease.  Spirometry 04/16/2013 shows nl f/v loop   cxr 07/25/13  No active cardiopulmonary disease.     Assessment & Plan:

## 2013-09-30 ENCOUNTER — Ambulatory Visit (INDEPENDENT_AMBULATORY_CARE_PROVIDER_SITE_OTHER): Payer: Medicare Other | Admitting: Neurology

## 2013-09-30 ENCOUNTER — Encounter (INDEPENDENT_AMBULATORY_CARE_PROVIDER_SITE_OTHER): Payer: Self-pay

## 2013-09-30 ENCOUNTER — Encounter: Payer: Self-pay | Admitting: Neurology

## 2013-09-30 VITALS — BP 165/70 | HR 79 | Ht 60.0 in | Wt 173.5 lb

## 2013-09-30 DIAGNOSIS — F0391 Unspecified dementia with behavioral disturbance: Secondary | ICD-10-CM

## 2013-09-30 DIAGNOSIS — G2401 Drug induced subacute dyskinesia: Secondary | ICD-10-CM

## 2013-09-30 DIAGNOSIS — R279 Unspecified lack of coordination: Secondary | ICD-10-CM

## 2013-09-30 DIAGNOSIS — R269 Unspecified abnormalities of gait and mobility: Secondary | ICD-10-CM

## 2013-09-30 DIAGNOSIS — F03918 Unspecified dementia, unspecified severity, with other behavioral disturbance: Secondary | ICD-10-CM

## 2013-09-30 NOTE — Progress Notes (Signed)
PATIENT: Cindy Padilla DOB: 08-10-1940  HISTORICAL (Initial visit Feb 2015)  Cindy Padilla is  a 73 years old right-handed Caucasian female, referred by PA Isla PenceSherri Osborne at her assisted living for evaluation of tremor, she is accompanied by staff, who has known her for 2 years  She has long standing history of bipolar disorder, was treated with different medications in the past, but she could not recall the name of the medications, for a while, she was receiving [facility]of unknown name) [facility], she was also treated with ECT for severe depression in the past.  Few months ago, she was put it on Latuda, which is an antipsychotic agent that acts as an antagonist at dopamine type-2 (D2) and 5-hydroxytryptamine (5-HT)2A receptors. It also has moderate antagonistic activity at alpha-2C and alpha-2A adrenergic receptors and is a partial agonist at 5-HT1A receptors  She reported significant side effect, "makes me like a zombie", she has increased her bilateral hands tremor, abnormal mouth movement, to the point that she could not holding her folks to feed herself,  It was stopped few months ago, she is now much improved, at baseline, she has mild gait difficulty, using walkers, because of low back pain, bilateral feet pain, her hand tremor has much improved, she has no problem eating now  She denied loss sense of smell, her mood is overall stable on the current polypharmacy including benztropine 1 mg twice a day, Anafranil 10 mg a day, lamotrigine 200 mg twice a day, lorazepam 1 mg every 12 hours, quetiapine 100 mg twice a day.  UPDATE May 7th 2015:    She is doing well, her tremor has much after stopping latuda.  She also had vagal nerve stimulator many years ago, she is feeling well now  Laboratory evaluation showed a normal CMP, CBC, TSH, B12  Review OF SYSTEMS: Full 14 system review of systems performed and notable only for  gait difficulty    ALLERGIES: Allergies  Allergen Reactions  . Aspirin     unknown  . Codeine     unknown  . Ivp Dye [Iodinated Diagnostic Agents]     unknown  . Morphine And Related     unknown  . Neomycin     unknown  . Penicillins     unknown  . Promethazine     unknown  . Sulfa Antibiotics     unknown  . Tetracyclines & Related     unknown    HOME MEDICATIONS: Outpatient Prescriptions Prior to Visit  Medication Sig Dispense Refill  . acetaminophen (TYLENOL) 500 MG tablet Take 500 mg by mouth every 4 (four) hours as needed for pain or fever.      Marland Kitchen. albuterol (PROVENTIL HFA;VENTOLIN HFA) 108 (90 BASE) MCG/ACT inhaler Inhale 2 puffs into the lungs every 4 (four) hours as needed for wheezing or shortness of breath.      Marland Kitchen. albuterol (PROVENTIL) (2.5 MG/3ML) 0.083% nebulizer solution Take 2.5 mg by nebulization every 2 (two) hours as needed for wheezing or shortness of breath.      Marland Kitchen. alum & mag hydroxide-simeth (GERI-LANTA) 200-200-20 MG/5ML suspension Take 30 mLs by mouth every 6 (six) hours as needed for indigestion or heartburn.      Marland Kitchen. antiseptic oral rinse (BIOTENE) LIQD 15 mLs by Mouth Rinse route at bedtime.      Marland Kitchen. atorvastatin (LIPITOR) 40 MG tablet Take 40 mg by mouth every morning.       . budesonide-formoterol (SYMBICORT)  160-4.5 MCG/ACT inhaler Inhale 2 puffs into the lungs 2 (two) times daily.      . carvedilol (COREG) 12.5 MG tablet Take 12.5 mg by mouth 2 (two) times daily.      . cholecalciferol (VITAMIN D) 1000 UNITS tablet Take 1,000 Units by mouth every morning.       . clopidogrel (PLAVIX) 75 MG tablet Take 75 mg by mouth every morning.       . donepezil (ARICEPT) 10 MG tablet Take 10 mg by mouth at bedtime.       . fluticasone (FLONASE) 50 MCG/ACT nasal spray Place 2 sprays into the nose every morning.       . furosemide (LASIX) 20 MG tablet Take 20 mg by mouth every morning.       Marland Kitchen guaifenesin (ROBITUSSIN) 100 MG/5ML syrup Take 200 mg by mouth 3 (three) times daily as  needed for cough.      Marland Kitchen HYDROcodone-acetaminophen (NORCO/VICODIN) 5-325 MG per tablet Take 1 tablet by mouth every 6 (six) hours as needed for moderate pain.      Marland Kitchen lamoTRIgine (LAMICTAL) 200 MG tablet Take 200 mg by mouth 2 (two) times daily.      Marland Kitchen lisinopril (PRINIVIL,ZESTRIL) 40 MG tablet Take 40 mg by mouth every morning.      . loperamide (IMODIUM) 2 MG capsule Take 2 mg by mouth as needed for diarrhea or loose stools.      Marland Kitchen loratadine (CLARITIN) 10 MG tablet Take 10 mg by mouth every morning.       Marland Kitchen LORazepam (ATIVAN) 1 MG tablet Take 1 mg by mouth every 12 (twelve) hours.      . magnesium hydroxide (MILK OF MAGNESIA) 400 MG/5ML suspension Take 30 mLs by mouth at bedtime as needed for mild constipation.      . magnesium oxide (MAG-OX) 400 MG tablet Take 400 mg by mouth every morning.       . pantoprazole (PROTONIX) 40 MG tablet Take 40 mg by mouth every morning.       . potassium chloride SA (K-DUR,KLOR-CON) 20 MEQ tablet Take 40 mEq by mouth every morning.      Marland Kitchen QUEtiapine (SEROQUEL) 100 MG tablet Take 100 mg by mouth at bedtime.      . senna-docusate (SENOKOT-S) 8.6-50 MG per tablet Take 1 tablet by mouth 2 (two) times daily.      . valsartan (DIOVAN) 320 MG tablet Take 1 tablet (320 mg total) by mouth daily.  30 tablet  11  . verapamil (CALAN-SR) 120 MG CR tablet Take 120 mg by mouth every morning.       . vitamin B-12 (CYANOCOBALAMIN) 1000 MCG tablet Take 1,000 mcg by mouth every morning.        No facility-administered medications prior to visit.    PAST MEDICAL HISTORY: Past Medical History  Diagnosis Date  . Hypertension   . GERD (gastroesophageal reflux disease)   . Dementia   . IBS (irritable bowel syndrome)   . Arthritis   . Constipation   . Hiatal hernia   . Bipolar 1 disorder   . Bronchitis   . Chronic back pain     PAST SURGICAL HISTORY: Past Surgical History  Procedure Laterality Date  . Gallbladder    . Partial hysterectomy    . Mandible surgery       FAMILY HISTORY: Family History  Problem Relation Age of Onset  . Emphysema Mother   . Cancer Other     SOCIAL  HISTORY:  History   Social History  . Marital Status: Divorced    Spouse Name: N/A    Number of Children: 2  . Years of Education: 7 th   Occupational History    retired   Social History Main Topics  . Smoking status: Former Smoker -- 0.50 packs/day for 2 years    Types: Cigarettes    Quit date: 05/28/1959  . Smokeless tobacco: Former Neurosurgeon    Types: Snuff    Quit date: 05/28/1959     Comment: Pt used snuff as a teenager  . Alcohol Use: No  . Drug Use: No  . Sexual Activity: No   Other Topics Concern  . Not on file   Social History Narrative   Patient lives [facility].   Retired   Education 7 th grade   Right handed.   Caffeine one cup of coffee daily     PHYSICAL EXAM   Filed Vitals:   09/30/13 1102  BP: 165/70  Pulse: 79  Height: 5' (1.524 m)  Weight: 173 lb 8 oz (78.699 kg)    Body mass index is 33.88 kg/(m^2).   Generalized: In no acute distress  Neck: Supple, no carotid bruits   Cardiac: Regular rate rhythm  Pulmonary: Clear to auscultation bilaterally  Musculoskeletal: No deformity  Neurological examination  Mentation: Alert oriented to time, place, history taking, and causual conversation she has had frequent mouse chewing movement, Mini-Mental Status Examination is 25 out of 30, she missed 1 out of 3 recalls, has difficulty write a complete sentence,   not oriented to date, and day  Cranial nerve II-XII: Pupils were equal round reactive to light extraocular movements were full, Visual field were full on confrontational test. Bilateral fundi were sharp.  Facial sensation and strength were normal. Hearing was intact to finger rubbing bilaterally. Uvula tongue midline.  head turning and shoulder shrug and were normal and symmetric.Tongue protrusion into cheek strength was normal.  Motor: normal tone, bulk and  strength, Mild bilateral hands resting tremor, there was no significant rigidity, or bradykinesia    Sensory: Intact to fine touch, pinprick, preserved vibratory sensation, and proprioception at toes.  Coordination: Normal finger to nose, heel-to-shin bilaterally there was no truncal ataxia  Gait: Rising up from seated position pushing on a chair arm, cautious, wide base, mildly unsteady gait, small stride     Deep tendon reflexes: Brachioradialis 2/2, biceps 2/2, triceps 2/2, patellar 2/2, Achilles trace , plantar responses were flexor bilaterally.   DIAGNOSTIC DATA (LABS, IMAGING, TESTING) - I reviewed patient records, labs, notes, testing and imaging myself where available.  Lab Results  Component Value Date   WBC 5.8 07/26/2013   HGB 9.9* 07/26/2013   HCT 28.5* 07/26/2013   MCV 84.6 07/26/2013   PLT 173 07/26/2013      Component Value Date/Time   NA 133* 07/26/2013 0545   NA 136 07/01/2013 1056   K 3.5* 07/26/2013 0545   CL 96 07/26/2013 0545   CO2 24 07/26/2013 0545   GLUCOSE 122* 07/26/2013 0545   GLUCOSE 90 07/01/2013 1056   BUN 17 07/26/2013 0545   BUN 12 07/01/2013 1056   CREATININE 0.73 07/26/2013 0545   CALCIUM 8.9 07/26/2013 0545   PROT 6.9 07/25/2013 0325   PROT 6.7 07/01/2013 1056   ALBUMIN 3.1* 07/25/2013 0325   AST 22 07/25/2013 0325   ALT 12 07/25/2013 0325   ALKPHOS 87 07/25/2013 0325   BILITOT 0.3 07/25/2013 0325   GFRNONAA  83* 07/26/2013 0545   GFRAA >90 07/26/2013 0545   No results found for this basename: CHOL,  HDL,  LDLCALC,  LDLDIRECT,  TRIG,  CHOLHDL   Lab Results  Component Value Date   HGBA1C 6.6* 02/21/2013   Lab Results  Component Value Date   VITAMINB12 >1999* 07/01/2013   Lab Results  Component Value Date   TSH 2.650 07/01/2013      ASSESSMENT AND PLAN    73 years old Caucasian female, with past medical history of bipolar disorder, was on polypharmacy treatment, previously was also treated with Latuda, D 2 antagonists, developed worsening bilateral hands tremor,abnormal mouth  movement, most suggestive medicine side effect, tardive dyskinesia, which has much improved with stopping Latuda. She continued to have mild frequent mild mouth perking movement, that is noticed at today's visit,  Medicine side effect due to long-term depression, antipsychotic medication use, only return to clinic if new issue arise,   Levert FeinsteinYijun Errik Mitchelle, M.D. Ph.D.  [facility] [address], [address] [place], [place] [address] [phone]

## 2014-01-24 ENCOUNTER — Emergency Department (HOSPITAL_COMMUNITY)
Admission: EM | Admit: 2014-01-24 | Discharge: 2014-01-24 | Disposition: A | Discharge status: Home / Self Care | Payer: Medicare Other | Attending: Emergency Medicine | Admitting: Emergency Medicine

## 2014-01-24 ENCOUNTER — Encounter (HOSPITAL_COMMUNITY): Payer: Self-pay | Admitting: Emergency Medicine

## 2014-01-24 ENCOUNTER — Emergency Department (HOSPITAL_COMMUNITY): Payer: Medicare Other

## 2014-01-24 DIAGNOSIS — Z880 Allergy status to penicillin: Secondary | ICD-10-CM | POA: Diagnosis not present

## 2014-01-24 DIAGNOSIS — IMO0002 Reserved for concepts with insufficient information to code with codable children: Secondary | ICD-10-CM | POA: Insufficient documentation

## 2014-01-24 DIAGNOSIS — K5900 Constipation, unspecified: Secondary | ICD-10-CM | POA: Insufficient documentation

## 2014-01-24 DIAGNOSIS — Z79899 Other long term (current) drug therapy: Secondary | ICD-10-CM | POA: Insufficient documentation

## 2014-01-24 DIAGNOSIS — G8929 Other chronic pain: Secondary | ICD-10-CM | POA: Insufficient documentation

## 2014-01-24 DIAGNOSIS — R059 Cough, unspecified: Secondary | ICD-10-CM | POA: Insufficient documentation

## 2014-01-24 DIAGNOSIS — E119 Type 2 diabetes mellitus without complications: Secondary | ICD-10-CM | POA: Diagnosis not present

## 2014-01-24 DIAGNOSIS — R151 Fecal smearing: Secondary | ICD-10-CM | POA: Diagnosis present

## 2014-01-24 DIAGNOSIS — Z87891 Personal history of nicotine dependence: Secondary | ICD-10-CM | POA: Diagnosis not present

## 2014-01-24 DIAGNOSIS — R0602 Shortness of breath: Secondary | ICD-10-CM | POA: Diagnosis not present

## 2014-01-24 DIAGNOSIS — J3489 Other specified disorders of nose and nasal sinuses: Secondary | ICD-10-CM | POA: Diagnosis not present

## 2014-01-24 DIAGNOSIS — F319 Bipolar disorder, unspecified: Secondary | ICD-10-CM | POA: Insufficient documentation

## 2014-01-24 DIAGNOSIS — R062 Wheezing: Secondary | ICD-10-CM | POA: Insufficient documentation

## 2014-01-24 DIAGNOSIS — M129 Arthropathy, unspecified: Secondary | ICD-10-CM | POA: Insufficient documentation

## 2014-01-24 DIAGNOSIS — R05 Cough: Secondary | ICD-10-CM | POA: Diagnosis not present

## 2014-01-24 DIAGNOSIS — R058 Other specified cough: Secondary | ICD-10-CM

## 2014-01-24 LAB — I-STAT CHEM 8, ED
BUN: 18 mg/dL (ref 6–23)
CALCIUM ION: 1.13 mmol/L (ref 1.13–1.30)
Chloride: 101 mEq/L (ref 96–112)
Creatinine, Ser: 1.1 mg/dL (ref 0.50–1.10)
Glucose, Bld: 108 mg/dL — ABNORMAL HIGH (ref 70–99)
HCT: 34 % — ABNORMAL LOW (ref 36.0–46.0)
HEMOGLOBIN: 11.6 g/dL — AB (ref 12.0–15.0)
Potassium: 4.2 mEq/L (ref 3.7–5.3)
SODIUM: 134 meq/L — AB (ref 137–147)
TCO2: 24 mmol/L (ref 0–100)

## 2014-01-24 LAB — CBC WITH DIFFERENTIAL/PLATELET
BASOS ABS: 0 10*3/uL (ref 0.0–0.1)
Basophils Relative: 0 % (ref 0–1)
EOS PCT: 1 % (ref 0–5)
Eosinophils Absolute: 0.1 10*3/uL (ref 0.0–0.7)
HCT: 34 % — ABNORMAL LOW (ref 36.0–46.0)
Hemoglobin: 10.9 g/dL — ABNORMAL LOW (ref 12.0–15.0)
LYMPHS PCT: 31 % (ref 12–46)
Lymphs Abs: 2.5 10*3/uL (ref 0.7–4.0)
MCH: 27 pg (ref 26.0–34.0)
MCHC: 32.1 g/dL (ref 30.0–36.0)
MCV: 84.2 fL (ref 78.0–100.0)
MONO ABS: 0.9 10*3/uL (ref 0.1–1.0)
Monocytes Relative: 11 % (ref 3–12)
Neutro Abs: 4.5 10*3/uL (ref 1.7–7.7)
Neutrophils Relative %: 57 % (ref 43–77)
Platelets: 243 10*3/uL (ref 150–400)
RBC: 4.04 MIL/uL (ref 3.87–5.11)
RDW: 16 % — AB (ref 11.5–15.5)
WBC: 8 10*3/uL (ref 4.0–10.5)

## 2014-01-24 LAB — I-STAT TROPONIN, ED: Troponin i, poc: 0.01 ng/mL (ref 0.00–0.08)

## 2014-01-24 LAB — PRO B NATRIURETIC PEPTIDE: PRO B NATRI PEPTIDE: 130 pg/mL — AB (ref 0–125)

## 2014-01-24 MED ORDER — PREDNISONE 20 MG PO TABS: ORAL_TABLET | ORAL | Status: DC

## 2014-01-24 MED ORDER — BENZONATATE 100 MG PO CAPS
100.0000 mg | ORAL_CAPSULE | Freq: Once | ORAL | Status: AC
  Administered 2014-01-24: 100 mg via ORAL
  Filled 2014-01-24: qty 1

## 2014-01-24 MED ORDER — GUAIFENESIN 100 MG/5ML PO LIQD: 200.0000 mg | Freq: Three times a day (TID) | ORAL | Status: DC | PRN

## 2014-01-24 NOTE — ED Notes (Signed)
PTAR called  

## 2014-01-24 NOTE — ED Notes (Signed)
Bed: WA20 Expected date:  Expected time:  Means of arrival:  Comments: EMS 

## 2014-01-24 NOTE — ED Notes (Signed)
Pt from  [facility] via [facility] c/o cough and congestion. She reports that she was DX with pneumonia and has been taking Levaquin for the past 9 days with our relief. Also c/o  chest soreness from coughing and feeling as if the phlegm in stuck in her throat. Pt VSS and lungs clear upon assessment.

## 2014-01-24 NOTE — ED Notes (Signed)
Awaiting PTAR's arrival

## 2014-01-24 NOTE — ED Notes (Signed)
PTAR at bedside 

## 2014-01-24 NOTE — Discharge Instructions (Signed)
For your cough, congestion and wheezes please take steroid course as prescribe.  Follow up with your doctor for further care.    Cough, Adult  A cough is a reflex that helps clear your throat and airways. It can help heal the body or may be a reaction to an irritated airway. A cough may only last 2 or 3 weeks (acute) or may last more than 8 weeks (chronic).  CAUSES Acute cough:  Viral or bacterial infections. Chronic cough:  Infections.  Allergies.  Asthma.  Post-nasal drip.  Smoking.  Heartburn or acid reflux.  Some medicines.  Chronic lung problems (COPD).  Cancer. SYMPTOMS   Cough.  Fever.  Chest pain.  Increased breathing rate.  High-pitched whistling sound when breathing (wheezing).  Colored mucus that you cough up (sputum). TREATMENT   A bacterial cough may be treated with antibiotic medicine.  A viral cough must run its course and will not respond to antibiotics.  Your caregiver may recommend other treatments if you have a chronic cough. HOME CARE INSTRUCTIONS   Only take over-the-counter or prescription medicines for pain, discomfort, or fever as directed by your caregiver. Use cough suppressants only as directed by your caregiver.  Use a cold steam vaporizer or humidifier in your bedroom or home to help loosen secretions.  Sleep in a semi-upright position if your cough is worse at night.  Rest as needed.  Stop smoking if you smoke. SEEK IMMEDIATE MEDICAL CARE IF:   You have pus in your sputum.  Your cough starts to worsen.  You cannot control your cough with suppressants and are losing sleep.  You begin coughing up blood.  You have difficulty breathing.  You develop pain which is getting worse or is uncontrolled with medicine.  You have a fever. MAKE SURE YOU:   Understand these instructions.  Will watch your condition.  Will get help right away if you are not doing well or get worse. Document Released: 11/09/2010 Document  Revised: 08/05/2011 Document Reviewed: 11/09/2010 [facility] Patient Information 2015 [place], [place]. This information is not intended to replace advice given to you by your health care provider. Make sure you discuss any questions you have with your health care provider.

## 2014-01-24 NOTE — ED Notes (Signed)
Attempted to call report to [facility]

## 2014-01-24 NOTE — ED Notes (Signed)
Pt transported to XRAY and will attempt labs upon her return.

## 2014-01-24 NOTE — ED Provider Notes (Signed)
  Face-to-face evaluation   History: She complains of persistent cough despite being treated for pneumonia. She denies fever, chills, weakness, or dizziness. She has post nasal drip   Physical exam:  Alert elderly, female in minimal discomfort, . Lungs have good air movement, scattered expiratory wheezing. No rhonchi, or rales.   Medical screening examination/treatment/procedure(s) were conducted as a shared visit with non-physician practitioner(s) and myself.  I personally evaluated the patient during the encounter  Flint Melter, MD 01/25/14 [phone]

## 2014-01-24 NOTE — ED Notes (Signed)
Pt ambulated to restroom with RN 

## 2014-01-24 NOTE — ED Provider Notes (Signed)
CSN: [id number]     Arrival date & time 01/24/14  1530 History   First MD Initiated Contact with Patient 01/24/14 1543     Chief Complaint  Patient presents with  . Cough  . Nasal Congestion     (Consider location/radiation/quality/duration/timing/severity/associated sxs/prior Treatment) HPI  73 year old female with history of chronic back pain, bipolar, diabetes, dementia was brought here via EMS from [facility] for evaluation of cough and congestion. Patient states she developed sinus congestions, Productive cough, and postnasal drips around August 18. Her Dr. perform a chest x-ray and diagnosed her with pneumonia. She has been taken there were frequent for the past 9 days. Patient states that about it has helped and she felt better however she was recently switched from a warm room to a cold room and she felt that the change in the area has worsened the symptoms. She continued to endorse congestion and now pleuritic chest pain from persistent coughing. She also endorsed occasional wheezing which she uses her nebs as needed. Nebulizer has helped. She did received 2 nebs treatment prior to arrival. Pt sts since she mentioned about her pleuritic chest pain and cough with shortness of breath, her provider sent her to ER for further care.  Patient states she has no significant cardiac history. She is a former smoker. She denies any diaphoresis, nausea, lightheadedness, dizziness, or radicular chest pain or exertional chest pain.   Past Medical History  Diagnosis Date  . Hypertension   . GERD (gastroesophageal reflux disease)   . Dementia   . IBS (irritable bowel syndrome)   . Arthritis   . Constipation   . Hiatal hernia   . Bipolar 1 disorder   . Diabetes mellitus   . Bronchitis   . Chronic back pain    Past Surgical History  Procedure Laterality Date  . Gallbladder    . Partial hysterectomy    . Mandible surgery     Family History  Problem Relation Age of Onset  . Emphysema  Mother   . Cancer Other    History  Substance Use Topics  . Smoking status: Former Smoker -- 0.50 packs/day for 2 years    Types: Cigarettes    Quit date: 05/28/1959  . Smokeless tobacco: Former Neurosurgeon    Types: Snuff    Quit date: 05/28/1959     Comment: Pt used snuff as a teenager  . Alcohol Use: No   OB History   Grav Para Term Preterm Abortions TAB SAB Ect Mult Living                 Review of Systems  All other systems reviewed and are negative.  although patient has history of dementia, she appears to be a reliable historian    Allergies  Aspirin; Codeine; Ivp dye; Morphine and related; Neomycin; Penicillins; Promethazine; Sulfa antibiotics; and Tetracyclines & related  Home Medications   Prior to Admission medications   Medication Sig Start Date End Date Taking? Authorizing Provider  acetaminophen (TYLENOL) 500 MG tablet Take 500 mg by mouth every 6 (six) hours as needed.    Historical Provider, MD  albuterol (PROVENTIL HFA;VENTOLIN HFA) 108 (90 BASE) MCG/ACT inhaler Inhale 2 puffs into the lungs every 4 (four) hours as needed for wheezing or shortness of breath.    Historical Provider, MD  albuterol (PROVENTIL) (2.5 MG/3ML) 0.083% nebulizer solution Take 2.5 mg by nebulization every 2 (two) hours as needed for wheezing or shortness of breath.    Historical Provider,  MD  alum & mag hydroxide-simeth (GERI-LANTA) 200-200-20 MG/5ML suspension Take 30 mLs by mouth See admin instructions. Give 1 hour after meals and at bedtime as needed for indigestion    Historical Provider, MD  antiseptic oral rinse (BIOTENE) LIQD 15 mLs by Mouth Rinse route at bedtime.    Historical Provider, MD  atorvastatin (LIPITOR) 40 MG tablet Take 40 mg by mouth daily.     Historical Provider, MD  benztropine (COGENTIN) 1 MG tablet Take 1 mg by mouth 2 (two) times daily.    Historical Provider, MD  bisoprolol (ZEBETA) 5 MG tablet Take 0.5 tablets (2.5 mg total) by mouth daily. 07/27/13   Rocco Serene,  MD  budesonide-formoterol [facility]) 160-4.5 MCG/ACT inhaler Inhale 2 puffs into the lungs 2 (two) times daily. Rinse mouth after use    Historical Provider, MD  cholecalciferol (VITAMIN D) 1000 UNITS tablet Take 1,000 Units by mouth daily.     Historical Provider, MD  clopidogrel (PLAVIX) 75 MG tablet Take 75 mg by mouth daily.     Historical Provider, MD  donepezil (ARICEPT) 10 MG tablet Take 10 mg by mouth at bedtime.     Historical Provider, MD  famotidine (PEPCID) 10 MG tablet Take 1 tablet (10 mg total) by mouth at bedtime. 07/27/13   Rocco Serene, MD  fluticasone (FLONASE) 50 MCG/ACT nasal spray Place 2 sprays into both nostrils daily.     Historical Provider, MD  guaiFENesin (ROBITUSSIN) 100 MG/5ML liquid Take 200 mg by mouth 3 (three) times daily as needed for cough.    Historical Provider, MD  lamoTRIgine (LAMICTAL) 200 MG tablet Take 200 mg by mouth 2 (two) times daily.    Historical Provider, MD  loperamide (IMODIUM) 2 MG capsule Take by mouth as needed for diarrhea or loose stools.    Historical Provider, MD  loratadine (CLARITIN) 10 MG tablet Take 10 mg by mouth daily.     Historical Provider, MD  LORazepam (ATIVAN) 1 MG tablet Take 1 mg by mouth every 12 (twelve) hours.    Historical Provider, MD  magnesium oxide (MAG-OX) 400 MG tablet Take 400 mg by mouth daily.     Historical Provider, MD  pantoprazole (PROTONIX) 40 MG tablet Take 40 mg by mouth daily before breakfast.     Historical Provider, MD  polyethylene glycol (MIRALAX / GLYCOLAX) packet Take 17 g by mouth every other day. Dissolve in 6 oz of fluid and drink    Historical Provider, MD  QUEtiapine (SEROQUEL) 100 MG tablet Take 100 mg by mouth 2 (two) times daily. Hold for sedation    Historical Provider, MD  senna-docusate (SENOKOT-S) 8.6-50 MG per tablet Take 1 tablet by mouth 2 (two) times daily.    Historical Provider, MD  valsartan (DIOVAN) 320 MG tablet Take 1 tablet (320 mg total) by mouth daily. 04/16/13   Nyoka Cowden, MD  verapamil (CALAN-SR) 120 MG CR tablet Take 120 mg by mouth daily.     Historical Provider, MD   BP 136/61  Pulse 64  Temp(Src) 98.2 F (36.8 C) (Oral)  Resp 18  SpO2 100% Physical Exam  Nursing note and vitals reviewed. Constitutional: She is oriented to person, place, and time. She appears well-developed and well-nourished. No distress.  HENT:  Head: Atraumatic.  Right Ear: External ear normal.  Left Ear: External ear normal.  Mouth/Throat: Oropharynx is clear and moist.  Eyes: Conjunctivae are normal.  Neck: Neck supple. No JVD present.  Cardiovascular: Normal rate and regular rhythm.  Pulmonary/Chest: Effort normal. She has rales (faint rales heard at base of lungs, no obvious wheezes or rhonchi).  Abdominal: Soft. There is no tenderness.  Musculoskeletal: She exhibits edema (Trace edema noted to bilateral lower extremities with palpable distal pulses).  Neurological: She is alert and oriented to person, place, and time. GCS eye subscore is 4. GCS verbal subscore is 5. GCS motor subscore is 6.  Skin: No rash noted.  Psychiatric: She has a normal mood and affect.    ED Course  Procedures (including critical care time)  Patient with pleuritic chest pain, persistent cough, and a recent diagnosis and treatment of pneumonia. At this time patient appears to be in no acute respiratory distress. She is afebrile with stable normal vital signs. On exam patient does have some mild rales heard at the base of the lung however she does not appears to be floridly fluid overload. Given her age and her primary complaint, workup initiated, cough medication provided, chest x-ray ordered.  5:59 PM Chest x-ray without any evidence of acute infiltrate concerning for pneumonia. Her labs otherwise reassuring. ProBNP unremarkable, EKG and troponin all within normal limits. No active wheezing at this time. Plan to provide symptomatically treatment including a short course of steroid, and cough  medication. Otherwise patient stable for discharge. Care discussed with Dr. Effie Shy.  Labs Review Labs Reviewed  CBC WITH DIFFERENTIAL - Abnormal; Notable for the following:    Hemoglobin 10.9 (*)    HCT 34.0 (*)    RDW 16.0 (*)    All other components within normal limits  PRO B NATRIURETIC PEPTIDE - Abnormal; Notable for the following:    Pro B Natriuretic peptide (BNP) 130.0 (*)    All other components within normal limits  I-STAT CHEM 8, ED - Abnormal; Notable for the following:    Sodium 134 (*)    Glucose, Bld 108 (*)    Hemoglobin 11.6 (*)    HCT 34.0 (*)    All other components within normal limits  I-STAT TROPOININ, ED    Imaging Review Dg Chest 2 View  01/24/2014   CLINICAL DATA:  Recent pneumonia now with cough ; history of diabetes and hypertension  EXAM: CHEST  2 VIEW  COMPARISON:  PA and lateral chest x-ray of July 25, 2013.  FINDINGS: There is chronic elevation of the right hemidiaphragm. The left lung is adequately inflated and clear. There is no pleural effusion. The cardiac silhouette is mildly enlarged but stable. The pulmonary vascularity is not engorged. There is a neurostimulator generator projecting over the left mid lung with electrodes extending into the cervical region. The bony thorax is unremarkable where visualized.  IMPRESSION: There is chronic elevation of the right hemidiaphragm. There is no acute cardiopulmonary abnormality.   Electronically Signed   By: David  [place]   On: 01/24/2014 16:22     EKG Interpretation None      Date: 01/24/2014  Rate: 65  Rhythm: normal sinus rhythm  QRS Axis: normal  Intervals: normal  ST/T Wave abnormalities: normal  Conduction Disutrbances: none  Narrative Interpretation:   Old EKG Reviewed: No significant changes noted     MDM   Final diagnoses:  Respiratory tract congestion with cough    BP 152/54  Pulse 64  Temp(Src) 98.2 F (36.8 C) (Oral)  Resp 18  SpO2 95%  I have reviewed nursing notes and  vital signs. I personally reviewed the imaging tests through PACS system  I reviewed available ER/hospitalization records thought the EMR  Fayrene Helper, PA-C 01/24/14 [phone]

## 2014-03-20 ENCOUNTER — Encounter (HOSPITAL_COMMUNITY): Payer: Self-pay | Admitting: Emergency Medicine

## 2014-03-20 ENCOUNTER — Emergency Department (HOSPITAL_COMMUNITY)
Admission: EM | Admit: 2014-03-20 | Discharge: 2014-03-21 | Disposition: A | Discharge status: Home / Self Care | Payer: Medicare Other | Attending: Emergency Medicine | Admitting: Emergency Medicine

## 2014-03-20 DIAGNOSIS — Z7902 Long term (current) use of antithrombotics/antiplatelets: Secondary | ICD-10-CM | POA: Diagnosis not present

## 2014-03-20 DIAGNOSIS — G8929 Other chronic pain: Secondary | ICD-10-CM | POA: Insufficient documentation

## 2014-03-20 DIAGNOSIS — Z87891 Personal history of nicotine dependence: Secondary | ICD-10-CM | POA: Insufficient documentation

## 2014-03-20 DIAGNOSIS — I10 Essential (primary) hypertension: Secondary | ICD-10-CM | POA: Insufficient documentation

## 2014-03-20 DIAGNOSIS — Z880 Allergy status to penicillin: Secondary | ICD-10-CM | POA: Insufficient documentation

## 2014-03-20 DIAGNOSIS — M62831 Muscle spasm of calf: Secondary | ICD-10-CM | POA: Diagnosis not present

## 2014-03-20 DIAGNOSIS — K219 Gastro-esophageal reflux disease without esophagitis: Secondary | ICD-10-CM | POA: Diagnosis not present

## 2014-03-20 DIAGNOSIS — Z7951 Long term (current) use of inhaled steroids: Secondary | ICD-10-CM | POA: Insufficient documentation

## 2014-03-20 DIAGNOSIS — M1990 Unspecified osteoarthritis, unspecified site: Secondary | ICD-10-CM | POA: Insufficient documentation

## 2014-03-20 DIAGNOSIS — M79662 Pain in left lower leg: Secondary | ICD-10-CM | POA: Insufficient documentation

## 2014-03-20 DIAGNOSIS — Z79899 Other long term (current) drug therapy: Secondary | ICD-10-CM | POA: Insufficient documentation

## 2014-03-20 DIAGNOSIS — M79661 Pain in right lower leg: Secondary | ICD-10-CM | POA: Diagnosis not present

## 2014-03-20 DIAGNOSIS — F0390 Unspecified dementia without behavioral disturbance: Secondary | ICD-10-CM | POA: Insufficient documentation

## 2014-03-20 DIAGNOSIS — M62838 Other muscle spasm: Secondary | ICD-10-CM

## 2014-03-20 DIAGNOSIS — E119 Type 2 diabetes mellitus without complications: Secondary | ICD-10-CM | POA: Insufficient documentation

## 2014-03-20 DIAGNOSIS — R600 Localized edema: Secondary | ICD-10-CM | POA: Insufficient documentation

## 2014-03-20 DIAGNOSIS — R609 Edema, unspecified: Secondary | ICD-10-CM

## 2014-03-20 MED ORDER — ONDANSETRON HCL 4 MG/2ML IJ SOLN
4.0000 mg | Freq: Once | INTRAMUSCULAR | Status: AC
  Administered 2014-03-21: 4 mg via INTRAVENOUS
  Filled 2014-03-20: qty 2

## 2014-03-20 MED ORDER — FENTANYL CITRATE 0.05 MG/ML IJ SOLN
100.0000 ug | Freq: Once | INTRAMUSCULAR | Status: AC
  Administered 2014-03-21: 100 ug via INTRAVENOUS
  Filled 2014-03-20: qty 2

## 2014-03-20 NOTE — ED Notes (Signed)
Bed: RU04WA05 Expected date:  Expected time:  Means of arrival:  Comments: EMS 73yo F, leg pain and muscle cramps

## 2014-03-20 NOTE — ED Notes (Signed)
[facility], [facility] via [facility],  She did a lot of walking today and her legs began spasms around 8 pm  She recently stopped her potassium tablets.  Pt is moaning in pain

## 2014-03-21 DIAGNOSIS — M79662 Pain in left lower leg: Secondary | ICD-10-CM | POA: Diagnosis not present

## 2014-03-21 LAB — COMPREHENSIVE METABOLIC PANEL
ALBUMIN: 3.9 g/dL (ref 3.5–5.2)
ALT: 17 U/L (ref 0–35)
AST: 21 U/L (ref 0–37)
Alkaline Phosphatase: 103 U/L (ref 39–117)
Anion gap: 16 — ABNORMAL HIGH (ref 5–15)
BUN: 19 mg/dL (ref 6–23)
CALCIUM: 9.3 mg/dL (ref 8.4–10.5)
CO2: 24 mEq/L (ref 19–32)
Chloride: 92 mEq/L — ABNORMAL LOW (ref 96–112)
Creatinine, Ser: 1.01 mg/dL (ref 0.50–1.10)
GFR calc Af Amer: 62 mL/min — ABNORMAL LOW (ref >90)
GFR calc non Af Amer: 53 mL/min — ABNORMAL LOW (ref >90)
Glucose, Bld: 104 mg/dL — ABNORMAL HIGH (ref 70–99)
Potassium: 4.6 mEq/L (ref 3.7–5.3)
SODIUM: 132 meq/L — AB (ref 137–147)
Total Bilirubin: 0.3 mg/dL (ref 0.3–1.2)
Total Protein: 7.6 g/dL (ref 6.0–8.3)

## 2014-03-21 LAB — PRO B NATRIURETIC PEPTIDE: PRO B NATRI PEPTIDE: 189.9 pg/mL — AB (ref 0–125)

## 2014-03-21 LAB — CBC WITH DIFFERENTIAL/PLATELET
BASOS ABS: 0 10*3/uL (ref 0.0–0.1)
BASOS PCT: 0 % (ref 0–1)
EOS PCT: 2 % (ref 0–5)
Eosinophils Absolute: 0.2 10*3/uL (ref 0.0–0.7)
HEMATOCRIT: 34.3 % — AB (ref 36.0–46.0)
Hemoglobin: 11.2 g/dL — ABNORMAL LOW (ref 12.0–15.0)
Lymphocytes Relative: 35 % (ref 12–46)
Lymphs Abs: 2.9 10*3/uL (ref 0.7–4.0)
MCH: 28.1 pg (ref 26.0–34.0)
MCHC: 32.7 g/dL (ref 30.0–36.0)
MCV: 86.2 fL (ref 78.0–100.0)
Monocytes Absolute: 1.1 10*3/uL — ABNORMAL HIGH (ref 0.1–1.0)
Monocytes Relative: 13 % — ABNORMAL HIGH (ref 3–12)
NEUTROS ABS: 4.1 10*3/uL (ref 1.7–7.7)
Neutrophils Relative %: 50 % (ref 43–77)
PLATELETS: 257 10*3/uL (ref 150–400)
RBC: 3.98 MIL/uL (ref 3.87–5.11)
RDW: 15.7 % — AB (ref 11.5–15.5)
WBC: 8.3 10*3/uL (ref 4.0–10.5)

## 2014-03-21 LAB — CK: CK TOTAL: 242 U/L — AB (ref 7–177)

## 2014-03-21 MED ORDER — FUROSEMIDE 10 MG/ML IJ SOLN
40.0000 mg | Freq: Once | INTRAMUSCULAR | Status: AC
  Administered 2014-03-21: 40 mg via INTRAVENOUS
  Filled 2014-03-21: qty 4

## 2014-03-21 MED ORDER — HYDROCODONE-ACETAMINOPHEN 5-325 MG PO TABS: 1.0000 | ORAL_TABLET | Freq: Four times a day (QID) | ORAL | Status: DC | PRN

## 2014-03-21 MED ORDER — FUROSEMIDE 40 MG PO TABS: 40.0000 mg | ORAL_TABLET | Freq: Every day | ORAL | Status: DC

## 2014-03-21 MED ORDER — DIAZEPAM 5 MG PO TABS
5.0000 mg | ORAL_TABLET | Freq: Once | ORAL | Status: AC
  Administered 2014-03-21: 5 mg via ORAL
  Filled 2014-03-21: qty 1

## 2014-03-21 NOTE — Discharge Instructions (Signed)
Please stop taking your Lipitor until otherwise directed. You need to follow up with your PCP in the next 2 days. Please return to the Emergency Room with new or worsening symptoms.  Edema Edema is an abnormal buildup of fluids. It is more common in your legs and thighs. Painless swelling of the feet and ankles is more likely as a person ages. It also is common in looser skin, like around your eyes. HOME CARE   Keep the affected body part above the level of the heart while lying down.  Do not sit still or stand for a long time.  Do not put anything right under your knees when you lie down.  Do not wear tight clothes on your upper legs.  Exercise your legs to help the puffiness (swelling) go down.  Wear elastic bandages or support stockings as told by your doctor.  A low-salt diet may help lessen the puffiness.  Only take medicine as told by your doctor. GET HELP IF:  Treatment is not working.  You have heart, liver, or kidney disease and notice that your skin looks puffy or shiny.  You have puffiness in your legs that does not get better when you raise your legs.  You have sudden weight gain for no reason. GET HELP RIGHT AWAY IF:   You have shortness of breath or chest pain.  You cannot breathe when you lie down.  You have pain, redness, or warmth in the areas that are puffy.  You have heart, liver, or kidney disease and get edema all of a sudden.  You have a fever and your symptoms get worse all of a sudden. MAKE SURE YOU:   Understand these instructions.  Will watch your condition.  Will get help right away if you are not doing well or get worse. Document Released: 10/30/2007 Document Revised: 05/18/2013 Document Reviewed: 03/05/2013 [facility] Patient Information 2015 [place], [place]. This information is not intended to replace advice given to you by your health care provider. Make sure you discuss any questions you have with your health care provider.  Muscle  Cramps and Spasms Muscle cramps and spasms occur when a muscle or muscles tighten and you have no control over this tightening (involuntary muscle contraction). They are a common problem and can develop in any muscle. The most common place is in the calf muscles of the leg. Both muscle cramps and muscle spasms are involuntary muscle contractions, but they also have differences:   Muscle cramps are sporadic and painful. They may last a few seconds to a quarter of an hour. Muscle cramps are often more forceful and last longer than muscle spasms.  Muscle spasms may or may not be painful. They may also last just a few seconds or much longer. CAUSES  It is uncommon for cramps or spasms to be due to a serious underlying problem. In many cases, the cause of cramps or spasms is unknown. Some common causes are:   Overexertion.   Overuse from repetitive motions (doing the same thing over and over).   Remaining in a certain position for a long period of time.   Improper preparation, form, or technique while performing a sport or activity.   Dehydration.   Injury.   Side effects of some medicines.   Abnormally low levels of the salts and ions in your blood (electrolytes), especially potassium and calcium. This could happen if you are taking water pills (diuretics) or you are pregnant.  Some underlying medical problems can make  it more likely to develop cramps or spasms. These include, but are not limited to:   Diabetes.   Parkinson disease.   Hormone disorders, such as thyroid problems.   Alcohol abuse.   Diseases specific to muscles, joints, and bones.   Blood vessel disease where not enough blood is getting to the muscles.  HOME CARE INSTRUCTIONS   Stay well hydrated. Drink enough water and fluids to keep your urine clear or pale yellow.  It may be helpful to massage, stretch, and relax the affected muscle.  For tight or tense muscles, use a warm towel, heating pad, or  hot shower water directed to the affected area.  If you are sore or have pain after a cramp or spasm, applying ice to the affected area may relieve discomfort.  Put ice in a plastic bag.  Place a towel between your skin and the bag.  Leave the ice on for 15-20 minutes, 03-04 times a day.  Medicines used to treat a known cause of cramps or spasms may help reduce their frequency or severity. Only take over-the-counter or prescription medicines as directed by your caregiver. SEEK MEDICAL CARE IF:  Your cramps or spasms get more severe, more frequent, or do not improve over time.  MAKE SURE YOU:   Understand these instructions.  Will watch your condition.  Will get help right away if you are not doing well or get worse. Document Released: 11/02/2001 Document Revised: 09/07/2012 Document Reviewed: 04/29/2012 [facility] Patient Information 2015 [place], [place]. This information is not intended to replace advice given to you by your health care provider. Make sure you discuss any questions you have with your health care provider.

## 2014-03-21 NOTE — ED Notes (Signed)
[facility] called  ,  [facility]

## 2014-03-21 NOTE — ED Provider Notes (Signed)
CSN: [id number]     Arrival date & time 03/20/14  2216 History   First MD Initiated Contact with Patient 03/20/14 2244     Chief Complaint  Patient presents with  . Leg Pain     (Consider location/radiation/quality/duration/timing/severity/associated sxs/prior Treatment) HPI Comments: Patient is a 73 year old female with history of hypertension, GERD, dementia, IBS, bipolar 1 disorder, diabetes who presents to the emergency department today for bilateral leg pain and spasming. She reports that this evening after she ate dinner at [employer] she developed this cramping. Her legs have been swollen bilaterally. She reports that one month ago she stopped taking her potassium pill and wonders if this could be contributing to her pain. She denies any chest pain, shortness of breath.   Patient is a 73 y.o. female presenting with leg pain. The history is provided by the patient. No language interpreter was used.  Leg Pain Associated symptoms: no fever     Past Medical History  Diagnosis Date  . Hypertension   . GERD (gastroesophageal reflux disease)   . Dementia   . IBS (irritable bowel syndrome)   . Arthritis   . Constipation   . Hiatal hernia   . Bipolar 1 disorder   . Diabetes mellitus   . Bronchitis   . Chronic back pain    Past Surgical History  Procedure Laterality Date  . Gallbladder    . Partial hysterectomy    . Mandible surgery     Family History  Problem Relation Age of Onset  . Emphysema Mother   . Cancer Other    History  Substance Use Topics  . Smoking status: Former Smoker -- 0.50 packs/day for 2 years    Types: Cigarettes    Quit date: 05/28/1959  . Smokeless tobacco: Former Neurosurgeon    Types: Snuff    Quit date: 05/28/1959     Comment: Pt used snuff as a teenager  . Alcohol Use: No   OB History   Grav Para Term Preterm Abortions TAB SAB Ect Mult Living                 Review of Systems  Constitutional: Negative for fever and chills.   Respiratory: Negative for shortness of breath.   Cardiovascular: Positive for leg swelling. Negative for chest pain.  Gastrointestinal: Negative for nausea, vomiting and abdominal pain.  All other systems reviewed and are negative.     Allergies  Aspirin; Codeine; Ivp dye; Morphine and related; Neomycin; Penicillins; Promethazine; Sulfa antibiotics; and Tetracyclines & related  Home Medications   Prior to Admission medications   Medication Sig Start Date End Date Taking? Authorizing Provider  acetaminophen (TYLENOL) 500 MG tablet Take 500 mg by mouth every 8 (eight) hours as needed for moderate pain.    Yes Historical Provider, MD  albuterol (PROVENTIL) (2.5 MG/3ML) 0.083% nebulizer solution Take 2.5 mg by nebulization every 2 (two) hours as needed for wheezing or shortness of breath.   Yes Historical Provider, MD  alum & mag hydroxide-simeth (GERI-LANTA) 200-200-20 MG/5ML suspension Take 30 mLs by mouth See admin instructions. Give 1 hour after meals and at bedtime as needed for indigestion   Yes Historical Provider, MD  amLODipine (NORVASC) 5 MG tablet Take 5 mg by mouth daily.   Yes Historical Provider, MD  antiseptic oral rinse (BIOTENE) LIQD 15 mLs by Mouth Rinse route at bedtime.   Yes Historical Provider, MD  atorvastatin (LIPITOR) 40 MG tablet Take 40 mg by mouth daily.  Yes Historical Provider, MD  budesonide-formoterol (SYMBICORT) 160-4.5 MCG/ACT inhaler Inhale 2 puffs into the lungs 2 (two) times daily. Rinse mouth after use   Yes Historical Provider, MD  cholecalciferol (VITAMIN D) 1000 UNITS tablet Take 1,000 Units by mouth daily.    Yes Historical Provider, MD  clopidogrel (PLAVIX) 75 MG tablet Take 75 mg by mouth daily.    Yes Historical Provider, MD  donepezil (ARICEPT) 10 MG tablet Take 10 mg by mouth at bedtime.    Yes Historical Provider, MD  fluticasone (FLONASE) 50 MCG/ACT nasal spray Place 2 sprays into both nostrils daily.    Yes Historical Provider, MD   lamoTRIgine (LAMICTAL) 200 MG tablet Take 200 mg by mouth 2 (two) times daily.   Yes Historical Provider, MD  loratadine (CLARITIN) 10 MG tablet Take 10 mg by mouth daily.    Yes Historical Provider, MD  LORazepam (ATIVAN) 1 MG tablet Take 1 mg by mouth every 12 (twelve) hours.   Yes Historical Provider, MD  LORazepam (ATIVAN) 1 MG tablet Take 1 mg by mouth daily as needed for anxiety.   Yes Historical Provider, MD  magnesium oxide (MAG-OX) 400 MG tablet Take 400 mg by mouth daily.    Yes Historical Provider, MD  pantoprazole (PROTONIX) 40 MG tablet Take 40 mg by mouth daily before breakfast.    Yes Historical Provider, MD  polyethylene glycol (MIRALAX / GLYCOLAX) packet Take 17 g by mouth every other day. Dissolve in 6 oz of fluid and drink   Yes Historical Provider, MD  QUEtiapine (SEROQUEL) 100 MG tablet Take 100 mg by mouth 2 (two) times daily. Hold for sedation   Yes Historical Provider, MD  ranitidine (ZANTAC) 150 MG tablet Take 150 mg by mouth 2 (two) times daily.   Yes Historical Provider, MD  senna-docusate (SENOKOT-S) 8.6-50 MG per tablet Take 1 tablet by mouth 2 (two) times daily.   Yes Historical Provider, MD  valsartan (DIOVAN) 320 MG tablet Take 1 tablet (320 mg total) by mouth daily. 04/16/13  Yes Nyoka CowdenMichael B Wert, MD  verapamil (CALAN-SR) 120 MG CR tablet Take 120 mg by mouth daily.    Yes Historical Provider, MD  albuterol (PROVENTIL HFA;VENTOLIN HFA) 108 (90 BASE) MCG/ACT inhaler Inhale 2 puffs into the lungs every 4 (four) hours as needed for wheezing or shortness of breath.    Historical Provider, MD  guaiFENesin (ROBITUSSIN) 100 MG/5ML liquid Take 10 mLs (200 mg total) by mouth 3 (three) times daily as needed for cough. 01/24/14   Fayrene HelperBowie Tran, PA-C  loperamide (IMODIUM) 2 MG capsule Take 2 mg by mouth as needed for diarrhea or loose stools.     Historical Provider, MD  magnesium hydroxide (MILK OF MAGNESIA) 400 MG/5ML suspension Take 30 mLs by mouth at bedtime as needed for mild  constipation.    Historical Provider, MD  Jeanann LewandowskyWitch Hazel (PREPARATION H EX) Apply 1 application topically 4 (four) times daily as needed (Hemorrhoidal pain.).    Historical Provider, MD   BP 117/57  Pulse 74  Temp(Src) 98.7 F (37.1 C) (Oral)  Resp 18  SpO2 96% Physical Exam  Nursing note and vitals reviewed. Constitutional: She is oriented to person, place, and time. She appears well-developed and well-nourished. No distress.  HENT:  Head: Normocephalic and atraumatic.  Right Ear: External ear normal.  Left Ear: External ear normal.  Nose: Nose normal.  Mouth/Throat: Oropharynx is clear and moist.  Eyes: Conjunctivae are normal.  Neck: Normal range of motion.  Cardiovascular: Normal  rate, regular rhythm, normal heart sounds, intact distal pulses and normal pulses.   Pulses:      Dorsalis pedis pulses are 2+ on the right side, and 2+ on the left side.  Bilateral edema to lower extremities Minimal erythema to bilateral lower extremities Tender to palpation diffusely over legs bilaterally  Pulmonary/Chest: Effort normal and breath sounds normal. No stridor. No respiratory distress. She has no wheezes. She has no rales.  Abdominal: Soft. She exhibits no distension.  Musculoskeletal: Normal range of motion.  Neurological: She is alert and oriented to person, place, and time. She has normal strength.  Skin: Skin is warm and dry. She is not diaphoretic. No erythema.  Psychiatric: She has a normal mood and affect. Her behavior is normal.    ED Course  Procedures (including critical care time) Labs Review Labs Reviewed  CBC WITH DIFFERENTIAL - Abnormal; Notable for the following:    Hemoglobin 11.2 (*)    HCT 34.3 (*)    RDW 15.7 (*)    Monocytes Relative 13 (*)    Monocytes Absolute 1.1 (*)    All other components within normal limits  COMPREHENSIVE METABOLIC PANEL - Abnormal; Notable for the following:    Sodium 132 (*)    Chloride 92 (*)    Glucose, Bld 104 (*)    GFR calc  non Af Amer 53 (*)    GFR calc Af Amer 62 (*)    Anion gap 16 (*)    All other components within normal limits  CK - Abnormal; Notable for the following:    Total CK 242 (*)    All other components within normal limits  PRO B NATRIURETIC PEPTIDE - Abnormal; Notable for the following:    Pro B Natriuretic peptide (BNP) 189.9 (*)    All other components within normal limits    Imaging Review No results found.   EKG Interpretation   Date/Time:  Sunday March 20 2014 22:26:52 EDT Ventricular Rate:  71 PR Interval:  183 QRS Duration: 96 QT Interval:  420 QTC Calculation: 456 R Axis:   29 Text Interpretation:  Sinus rhythm Low voltage, precordial leads When  compared with ECG of 01/24/2014, No significant change was found Confirmed  by [facility]  MD, DAVID ([id number]) on 03/21/2014 12:11:54 AM      MDM   Final diagnoses:  Peripheral edema  Muscle spasms of both lower extremities    Patient presents emergency room for evaluation of muscle spasms and cramps in lower extremities bilaterally. Patient has bilateral pitting edema to lower extremities. Patient feels improved after doses of fentanyl in the emergency department. Area does not appear infectious. No concern for DVT. Electrolytes are unremarkable. Lab show minimally elevated CK at 242. Patient was encouraged to stop her statin. She will follow up with her primary care provider within 2 days. Will give dose of Lasix in emergency department and sent home with Lasix. No chest pain or shortness of breath. Vital signs stable for discharge. Discussed reasons to return to emergency department immediately. Dr. Preston FleetingGlick evaluated patient and agrees with plan. Patient / Family / Caregiver informed of clinical course, understand medical decision-making process, and agree with plan.     Mora BellmanHannah S Eulalie Speights, PA-C 03/21/14 [phone]

## 2014-03-21 NOTE — ED Provider Notes (Signed)
73 year old female noted onset today of swelling in her legs as well as some muscle cramping or cramping is all lab to her hips and is severe. On exam, her lungs are clear. There is no presacral edema. There is 2+ pitting edema of her legs with mild erythema. 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 Laboratory workup is significant for mildly elevated BNP. It is noted that she is on a statin. She is advised to stop taking the statin and she is given a prescription for furosemide. She is to follow-up with her PCP to decide whether she needs ongoing treatment with furosemide and whether to reinstate statin treatment.  Medical screening examination/treatment/procedure(s) were conducted as a shared visit with non-physician practitioner(s) and myself.  I personally evaluated the patient during the encounter.   EKG Interpretation   Date/Time:  Sunday March 20 2014 22:26:52 EDT Ventricular Rate:  71 PR Interval:  183 QRS Duration: 96 QT Interval:  420 QTC Calculation: 456 R Axis:   29 Text Interpretation:  Sinus rhythm Low voltage, precordial leads When  compared with ECG of 01/24/2014, No significant change was found Confirmed  by [facility]  MD, Zeena Starkel ([id number]) on 03/21/2014 12:11:54 AM        Dione Boozeavid Lelar Farewell, MD 03/21/14 [id number]

## 2014-04-08 ENCOUNTER — Emergency Department (HOSPITAL_COMMUNITY): Payer: Medicare Other

## 2014-04-08 ENCOUNTER — Emergency Department (HOSPITAL_COMMUNITY)
Admission: EM | Admit: 2014-04-08 | Discharge: 2014-04-08 | Disposition: A | Discharge status: Home / Self Care | Payer: Medicare Other | Attending: Emergency Medicine | Admitting: Emergency Medicine

## 2014-04-08 DIAGNOSIS — E119 Type 2 diabetes mellitus without complications: Secondary | ICD-10-CM | POA: Insufficient documentation

## 2014-04-08 DIAGNOSIS — I10 Essential (primary) hypertension: Secondary | ICD-10-CM | POA: Diagnosis not present

## 2014-04-08 DIAGNOSIS — Z79899 Other long term (current) drug therapy: Secondary | ICD-10-CM | POA: Diagnosis not present

## 2014-04-08 DIAGNOSIS — K2970 Gastritis, unspecified, without bleeding: Secondary | ICD-10-CM

## 2014-04-08 DIAGNOSIS — K219 Gastro-esophageal reflux disease without esophagitis: Secondary | ICD-10-CM | POA: Diagnosis not present

## 2014-04-08 DIAGNOSIS — M1990 Unspecified osteoarthritis, unspecified site: Secondary | ICD-10-CM | POA: Diagnosis not present

## 2014-04-08 DIAGNOSIS — F0390 Unspecified dementia without behavioral disturbance: Secondary | ICD-10-CM | POA: Diagnosis not present

## 2014-04-08 DIAGNOSIS — G8929 Other chronic pain: Secondary | ICD-10-CM | POA: Diagnosis not present

## 2014-04-08 DIAGNOSIS — R109 Unspecified abdominal pain: Secondary | ICD-10-CM | POA: Diagnosis present

## 2014-04-08 DIAGNOSIS — Z7951 Long term (current) use of inhaled steroids: Secondary | ICD-10-CM | POA: Insufficient documentation

## 2014-04-08 DIAGNOSIS — Z720 Tobacco use: Secondary | ICD-10-CM | POA: Diagnosis not present

## 2014-04-08 DIAGNOSIS — F319 Bipolar disorder, unspecified: Secondary | ICD-10-CM | POA: Insufficient documentation

## 2014-04-08 DIAGNOSIS — Z880 Allergy status to penicillin: Secondary | ICD-10-CM | POA: Diagnosis not present

## 2014-04-08 DIAGNOSIS — Z7902 Long term (current) use of antithrombotics/antiplatelets: Secondary | ICD-10-CM | POA: Insufficient documentation

## 2014-04-08 DIAGNOSIS — R0789 Other chest pain: Secondary | ICD-10-CM

## 2014-04-08 LAB — COMPREHENSIVE METABOLIC PANEL
ALT: 19 U/L (ref 0–35)
AST: 20 U/L (ref 0–37)
Albumin: 3.9 g/dL (ref 3.5–5.2)
Alkaline Phosphatase: 108 U/L (ref 39–117)
Anion gap: 14 (ref 5–15)
BUN: 13 mg/dL (ref 6–23)
CO2: 26 mEq/L (ref 19–32)
CREATININE: 0.84 mg/dL (ref 0.50–1.10)
Calcium: 9.7 mg/dL (ref 8.4–10.5)
Chloride: 99 mEq/L (ref 96–112)
GFR calc Af Amer: 77 mL/min — ABNORMAL LOW (ref >90)
GFR, EST NON AFRICAN AMERICAN: 67 mL/min — AB (ref >90)
Glucose, Bld: 100 mg/dL — ABNORMAL HIGH (ref 70–99)
Potassium: 4.4 mEq/L (ref 3.7–5.3)
SODIUM: 139 meq/L (ref 137–147)
TOTAL PROTEIN: 8.1 g/dL (ref 6.0–8.3)
Total Bilirubin: 0.2 mg/dL — ABNORMAL LOW (ref 0.3–1.2)

## 2014-04-08 LAB — CBC
HCT: 36 % (ref 36.0–46.0)
Hemoglobin: 11.9 g/dL — ABNORMAL LOW (ref 12.0–15.0)
MCH: 28.8 pg (ref 26.0–34.0)
MCHC: 33.1 g/dL (ref 30.0–36.0)
MCV: 87.2 fL (ref 78.0–100.0)
PLATELETS: 264 10*3/uL (ref 150–400)
RBC: 4.13 MIL/uL (ref 3.87–5.11)
RDW: 15.3 % (ref 11.5–15.5)
WBC: 6.4 10*3/uL (ref 4.0–10.5)

## 2014-04-08 LAB — LIPASE, BLOOD: LIPASE: 75 U/L — AB (ref 11–59)

## 2014-04-08 LAB — I-STAT TROPONIN, ED: TROPONIN I, POC: 0 ng/mL (ref 0.00–0.08)

## 2014-04-08 MED ORDER — GI COCKTAIL ~~LOC~~
30.0000 mL | Freq: Once | ORAL | Status: AC
  Administered 2014-04-08: 30 mL via ORAL
  Filled 2014-04-08: qty 30

## 2014-04-08 NOTE — ED Provider Notes (Signed)
CSN: [id number]     Arrival date & time 04/08/14  0909 History   First MD Initiated Contact with Patient 04/08/14 [phone]     Chief Complaint  Patient presents with  . Chest Pain  . Abdominal Pain  . Cough     (Consider location/radiation/quality/duration/timing/severity/associated sxs/prior Treatment) HPI   Cindy Padilla is a 73 y.o. female Presents for evaluation of upper abdominal cramping that was noticed as she sat down to breakfast this morning.  She has had similar problems in the past and when she was evaluated here, on 03/21/2014 she was having that discomfort.  She also has intermittent leg cramping, and peripheral edema.  Her doctor continued her Lasix that was initially prescribed 03/21/2014.  She states that she has postnasal drip and has to clear her throat occasionally but does not have cough or sputum production.  She denies fever, chills, nausea, vomiting, weakness, or dizziness.  She did not have any of her medications this morning.  There are no other known modifying factors.  Past Medical History  Diagnosis Date  . Hypertension   . GERD (gastroesophageal reflux disease)   . Dementia   . IBS (irritable bowel syndrome)   . Arthritis   . Constipation   . Hiatal hernia   . Bipolar 1 disorder   . Diabetes mellitus   . Bronchitis   . Chronic back pain    Past Surgical History  Procedure Laterality Date  . Gallbladder    . Partial hysterectomy    . Mandible surgery     Family History  Problem Relation Age of Onset  . Emphysema Mother   . Cancer Other    History  Substance Use Topics  . Smoking status: Former Smoker -- 0.50 packs/day for 2 years    Types: Cigarettes    Quit date: 05/28/1959  . Smokeless tobacco: Former NeurosurgeonUser    Types: Snuff    Quit date: 05/28/1959     Comment: Pt used snuff as a teenager  . Alcohol Use: No   OB History    No data available     Review of Systems  All other systems reviewed and are negative.     Allergies   Aspirin; Codeine; Ivp dye; Morphine and related; Neomycin; Penicillins; Promethazine; Sulfa antibiotics; and Tetracyclines & related  Home Medications   Prior to Admission medications   Medication Sig Start Date End Date Taking? Authorizing Provider  acetaminophen (TYLENOL) 500 MG tablet Take 500 mg by mouth every 8 (eight) hours as needed for moderate pain.     Historical Provider, MD  albuterol (PROVENTIL HFA;VENTOLIN HFA) 108 (90 BASE) MCG/ACT inhaler Inhale 2 puffs into the lungs every 4 (four) hours as needed for wheezing or shortness of breath.    Historical Provider, MD  albuterol (PROVENTIL) (2.5 MG/3ML) 0.083% nebulizer solution Take 2.5 mg by nebulization every 2 (two) hours as needed for wheezing or shortness of breath.    Historical Provider, MD  alum & mag hydroxide-simeth (GERI-LANTA) 200-200-20 MG/5ML suspension Take 30 mLs by mouth See admin instructions. Give 1 hour after meals and at bedtime as needed for indigestion    Historical Provider, MD  amLODipine (NORVASC) 5 MG tablet Take 5 mg by mouth daily.    Historical Provider, MD  antiseptic oral rinse (BIOTENE) LIQD 15 mLs by Mouth Rinse route at bedtime.    Historical Provider, MD  atorvastatin (LIPITOR) 40 MG tablet Take 40 mg by mouth daily.     Historical Provider,  MD  budesonide-formoterol (SYMBICORT) 160-4.5 MCG/ACT inhaler Inhale 2 puffs into the lungs 2 (two) times daily. Rinse mouth after use    Historical Provider, MD  cholecalciferol (VITAMIN D) 1000 UNITS tablet Take 1,000 Units by mouth daily.     Historical Provider, MD  clopidogrel (PLAVIX) 75 MG tablet Take 75 mg by mouth daily.     Historical Provider, MD  donepezil (ARICEPT) 10 MG tablet Take 10 mg by mouth at bedtime.     Historical Provider, MD  fluticasone (FLONASE) 50 MCG/ACT nasal spray Place 2 sprays into both nostrils daily.     Historical Provider, MD  furosemide (LASIX) 40 MG tablet Take 1 tablet (40 mg total) by mouth daily. 03/21/14   Mora BellmanHannah S  Merrell, PA-C  guaiFENesin (ROBITUSSIN) 100 MG/5ML liquid Take 10 mLs (200 mg total) by mouth 3 (three) times daily as needed for cough. 01/24/14   Fayrene HelperBowie Tran, PA-C  HYDROcodone-acetaminophen (NORCO/VICODIN) 5-325 MG per tablet Take 1 tablet by mouth every 6 (six) hours as needed for moderate pain or severe pain. 03/21/14   Mora BellmanHannah S Merrell, PA-C  lamoTRIgine (LAMICTAL) 200 MG tablet Take 200 mg by mouth 2 (two) times daily.    Historical Provider, MD  loperamide (IMODIUM) 2 MG capsule Take 2 mg by mouth as needed for diarrhea or loose stools.     Historical Provider, MD  loratadine (CLARITIN) 10 MG tablet Take 10 mg by mouth daily.     Historical Provider, MD  LORazepam (ATIVAN) 1 MG tablet Take 1 mg by mouth every 12 (twelve) hours.    Historical Provider, MD  LORazepam (ATIVAN) 1 MG tablet Take 1 mg by mouth daily as needed for anxiety.    Historical Provider, MD  magnesium hydroxide (MILK OF MAGNESIA) 400 MG/5ML suspension Take 30 mLs by mouth at bedtime as needed for mild constipation.    Historical Provider, MD  magnesium oxide (MAG-OX) 400 MG tablet Take 400 mg by mouth daily.     Historical Provider, MD  pantoprazole (PROTONIX) 40 MG tablet Take 40 mg by mouth daily before breakfast.     Historical Provider, MD  polyethylene glycol (MIRALAX / GLYCOLAX) packet Take 17 g by mouth every other day. Dissolve in 6 oz of fluid and drink    Historical Provider, MD  QUEtiapine (SEROQUEL) 100 MG tablet Take 100 mg by mouth 2 (two) times daily. Hold for sedation    Historical Provider, MD  ranitidine (ZANTAC) 150 MG tablet Take 150 mg by mouth 2 (two) times daily.    Historical Provider, MD  senna-docusate (SENOKOT-S) 8.6-50 MG per tablet Take 1 tablet by mouth 2 (two) times daily.    Historical Provider, MD  valsartan (DIOVAN) 320 MG tablet Take 1 tablet (320 mg total) by mouth daily. 04/16/13   Nyoka CowdenMichael B Wert, MD  verapamil (CALAN-SR) 120 MG CR tablet Take 120 mg by mouth daily.     Historical  Provider, MD  Jeanann LewandowskyWitch Hazel (PREPARATION H EX) Apply 1 application topically 4 (four) times daily as needed (Hemorrhoidal pain.).    Historical Provider, MD   BP 155/74 mmHg  Pulse 79  Temp(Src) 98.4 F (36.9 C) (Oral)  Resp 15  SpO2 98% Physical Exam  Constitutional: She is oriented to person, place, and time. She appears well-developed and well-nourished.  HENT:  Head: Normocephalic and atraumatic.  Right Ear: External ear normal.  Left Ear: External ear normal.  Eyes: Conjunctivae and EOM are normal. Pupils are equal, round, and reactive to light.  Neck: Normal range of motion and phonation normal. Neck supple.  Cardiovascular: Normal rate, regular rhythm and normal heart sounds.   Pulmonary/Chest: Effort normal and breath sounds normal. She exhibits no bony tenderness.  Abdominal: Soft. She exhibits no distension and no mass. There is no tenderness. There is no rebound and no guarding.  She has indistinct reducible midline hernia in the upper abdomen.  Musculoskeletal: Normal range of motion. She exhibits edema (2+ lower extremity, bilateral).  Neurological: She is alert and oriented to person, place, and time. No cranial nerve deficit or sensory deficit. She exhibits normal muscle tone. Coordination normal.  Skin: Skin is warm, dry and intact.  Psychiatric: Her behavior is normal. Judgment and thought content normal.  She is anxious  Nursing note and vitals reviewed.   ED Course  Procedures (including critical care time) Medications  gi cocktail (Maalox,Lidocaine,Donnatal) (30 mLs Oral Given 04/08/14 1040)    Patient Vitals for the past 24 hrs:  BP Temp Temp src Pulse Resp SpO2  04/08/14 0918 155/74 mmHg 98.4 F (36.9 C) Oral 79 15 98 %  04/08/14 0915 - - - - - 96 %    11:28 AM Reevaluation with update and discussion. After initial assessment and treatment, an updated evaluation reveals she remains comfortable.  There are no additional complaints.  Findings discussed with  patient, all questions answered.Flint Melter   Labs Review Labs Reviewed  CBC  COMPREHENSIVE METABOLIC PANEL  LIPASE, BLOOD  I-STAT TROPOININ, ED    Imaging Review Dg Chest Port 1 View  04/08/2014   CLINICAL DATA:  One day history of chest pain and occasional cough  EXAM: PORTABLE CHEST - 1 VIEW  COMPARISON:  January 24, 2014  FINDINGS: Mild elevation of the right hemidiaphragm is stable. There is no edema or consolidation. Heart size and pulmonary vascularity are normal. No adenopathy. Stimulator is seen on the left with lead extending into the neck region. No bone lesions.  IMPRESSION: No edema or consolidation.   Electronically Signed   By: Bretta Bang M.D.   On: 04/08/2014 09:48     EKG Interpretation   Date/Time:  Friday April 08 2014 09:20:31 EST Ventricular Rate:  68 PR Interval:  184 QRS Duration: 87 QT Interval:  418 QTC Calculation: 444 R Axis:   36 Text Interpretation:  Sinus rhythm Low voltage, precordial leads Confirmed  by Therma Lasure  MD, Monserath Neff ([id number]) on 04/08/2014 9:42:55 AM      MDM   Final diagnoses:  Gastritis    Nonspecific upper abdominal cramping, without evidence for acute metabolic or toxic process.  Doubt ACS, PE, or pneumonia.  Nursing Notes Reviewed/ Care Coordinated Applicable Imaging Reviewed Interpretation of Laboratory Data incorporated into ED treatment  The patient appears reasonably screened and/or stabilized for discharge and I doubt any other medical condition or other [facility] requiring further screening, evaluation, or treatment in the ED at this time prior to discharge.  Plan: Home Medications- usual; Home Treatments- rest; return here if the recommended treatment, does not improve the symptoms; Recommended follow up- PCP prn    Flint Melter, MD 04/08/14 1129

## 2014-04-08 NOTE — Discharge Instructions (Signed)
°  Continue using your Mylanta before meals and at bedtime. Resume your usual medications, when you get back to your facility.   Gastritis, Adult Gastritis is soreness and swelling (inflammation) of the lining of the stomach. Gastritis can develop as a sudden onset (acute) or long-term (chronic) condition. If gastritis is not treated, it can lead to stomach bleeding and ulcers. CAUSES  Gastritis occurs when the stomach lining is weak or damaged. Digestive juices from the stomach then inflame the weakened stomach lining. The stomach lining may be weak or damaged due to viral or bacterial infections. One common bacterial infection is the Helicobacter pylori infection. Gastritis can also result from excessive alcohol consumption, taking certain medicines, or having too much acid in the stomach.  SYMPTOMS  In some cases, there are no symptoms. When symptoms are present, they may include:  Pain or a burning sensation in the upper abdomen.  Nausea.  Vomiting.  An uncomfortable feeling of fullness after eating. DIAGNOSIS  Your caregiver may suspect you have gastritis based on your symptoms and a physical exam. To determine the cause of your gastritis, your caregiver may perform the following:  Blood or stool tests to check for the H pylori bacterium.  Gastroscopy. A thin, flexible tube (endoscope) is passed down the esophagus and into the stomach. The endoscope has a light and camera on the end. Your caregiver uses the endoscope to view the inside of the stomach.  Taking a tissue sample (biopsy) from the stomach to examine under a microscope. TREATMENT  Depending on the cause of your gastritis, medicines may be prescribed. If you have a bacterial infection, such as an H pylori infection, antibiotics may be given. If your gastritis is caused by too much acid in the stomach, H2 blockers or antacids may be given. Your caregiver may recommend that you stop taking aspirin, ibuprofen, or other  nonsteroidal anti-inflammatory drugs (NSAIDs). HOME CARE INSTRUCTIONS  Only take over-the-counter or prescription medicines as directed by your caregiver.  If you were given antibiotic medicines, take them as directed. Finish them even if you start to feel better.  Drink enough fluids to keep your urine clear or pale yellow.  Avoid foods and drinks that make your symptoms worse, such as:  Caffeine or alcoholic drinks.  Chocolate.  Peppermint or mint flavorings.  Garlic and onions.  Spicy foods.  Citrus fruits, such as oranges, lemons, or limes.  Tomato-based foods such as sauce, chili, salsa, and pizza.  Fried and fatty foods.  Eat small, frequent meals instead of large meals. SEEK IMMEDIATE MEDICAL CARE IF:   You have black or dark red stools.  You vomit blood or material that looks like coffee grounds.  You are unable to keep fluids down.  Your abdominal pain gets worse.  You have a fever.  You do not feel better after 1 week.  You have any other questions or concerns. MAKE SURE YOU:  Understand these instructions.  Will watch your condition.  Will get help right away if you are not doing well or get worse. Document Released: 05/07/2001 Document Revised: 11/12/2011 Document Reviewed: 06/26/2011 [facility] Patient Information 2015 [employer] [place]. This information is not intended to replace advice given to you by your health care provider. Make sure you discuss any questions you have with your health care provider.

## 2014-04-08 NOTE — ED Notes (Signed)
Bed: WA11 Expected date:  Expected time:  Means of arrival:  Comments: EMS 

## 2014-04-08 NOTE — ED Notes (Signed)
Patient transported to X-ray 

## 2014-04-08 NOTE — ED Notes (Signed)
[facility]  Pt presents in NAD. Pt c/o of upper stomach pain that readiates into chest and back- pt describes as "gripping" that increases with coughing and with deep breaths. Denies cold or fever. Denies N/V/D.  Pt states started "in the middle to last night" "pain is constant with increased episodes lasting undetermined amount of time".

## 2014-04-22 ENCOUNTER — Encounter (HOSPITAL_COMMUNITY): Payer: Self-pay | Admitting: *Deleted

## 2014-04-22 ENCOUNTER — Emergency Department (HOSPITAL_COMMUNITY): Payer: Medicare Other

## 2014-04-22 ENCOUNTER — Observation Stay (HOSPITAL_COMMUNITY)
Admission: EM | Admit: 2014-04-22 | Discharge: 2014-04-24 | Disposition: A | Discharge status: Home / Self Care | Payer: Medicare Other | Attending: Internal Medicine | Admitting: Internal Medicine

## 2014-04-22 DIAGNOSIS — K5909 Other constipation: Secondary | ICD-10-CM

## 2014-04-22 DIAGNOSIS — K56600 Partial intestinal obstruction, unspecified as to cause: Secondary | ICD-10-CM | POA: Diagnosis present

## 2014-04-22 DIAGNOSIS — I5030 Unspecified diastolic (congestive) heart failure: Secondary | ICD-10-CM | POA: Insufficient documentation

## 2014-04-22 DIAGNOSIS — K439 Ventral hernia without obstruction or gangrene: Secondary | ICD-10-CM | POA: Diagnosis present

## 2014-04-22 DIAGNOSIS — J45909 Unspecified asthma, uncomplicated: Secondary | ICD-10-CM | POA: Diagnosis present

## 2014-04-22 DIAGNOSIS — J449 Chronic obstructive pulmonary disease, unspecified: Secondary | ICD-10-CM | POA: Diagnosis not present

## 2014-04-22 DIAGNOSIS — K5669 Other intestinal obstruction: Secondary | ICD-10-CM

## 2014-04-22 DIAGNOSIS — F319 Bipolar disorder, unspecified: Secondary | ICD-10-CM | POA: Insufficient documentation

## 2014-04-22 DIAGNOSIS — R109 Unspecified abdominal pain: Secondary | ICD-10-CM

## 2014-04-22 DIAGNOSIS — R52 Pain, unspecified: Secondary | ICD-10-CM

## 2014-04-22 DIAGNOSIS — M5136 Other intervertebral disc degeneration, lumbar region: Secondary | ICD-10-CM | POA: Insufficient documentation

## 2014-04-22 DIAGNOSIS — K5900 Constipation, unspecified: Secondary | ICD-10-CM | POA: Diagnosis present

## 2014-04-22 DIAGNOSIS — K649 Unspecified hemorrhoids: Secondary | ICD-10-CM | POA: Diagnosis not present

## 2014-04-22 DIAGNOSIS — Z66 Do not resuscitate: Secondary | ICD-10-CM | POA: Diagnosis not present

## 2014-04-22 DIAGNOSIS — K219 Gastro-esophageal reflux disease without esophagitis: Secondary | ICD-10-CM | POA: Insufficient documentation

## 2014-04-22 DIAGNOSIS — F0391 Unspecified dementia with behavioral disturbance: Secondary | ICD-10-CM | POA: Insufficient documentation

## 2014-04-22 DIAGNOSIS — I10 Essential (primary) hypertension: Secondary | ICD-10-CM | POA: Diagnosis not present

## 2014-04-22 DIAGNOSIS — I5032 Chronic diastolic (congestive) heart failure: Secondary | ICD-10-CM | POA: Diagnosis present

## 2014-04-22 DIAGNOSIS — K625 Hemorrhage of anus and rectum: Secondary | ICD-10-CM | POA: Insufficient documentation

## 2014-04-22 DIAGNOSIS — K589 Irritable bowel syndrome without diarrhea: Secondary | ICD-10-CM | POA: Diagnosis not present

## 2014-04-22 DIAGNOSIS — E119 Type 2 diabetes mellitus without complications: Secondary | ICD-10-CM | POA: Diagnosis not present

## 2014-04-22 DIAGNOSIS — R1013 Epigastric pain: Secondary | ICD-10-CM | POA: Diagnosis present

## 2014-04-22 DIAGNOSIS — F03918 Unspecified dementia, unspecified severity, with other behavioral disturbance: Secondary | ICD-10-CM | POA: Diagnosis present

## 2014-04-22 LAB — COMPREHENSIVE METABOLIC PANEL
ALT: 17 U/L (ref 0–35)
AST: 20 U/L (ref 0–37)
Albumin: 3.6 g/dL (ref 3.5–5.2)
Alkaline Phosphatase: 99 U/L (ref 39–117)
Anion gap: 14 (ref 5–15)
BUN: 16 mg/dL (ref 6–23)
CALCIUM: 9.2 mg/dL (ref 8.4–10.5)
CO2: 28 meq/L (ref 19–32)
Chloride: 99 mEq/L (ref 96–112)
Creatinine, Ser: 0.81 mg/dL (ref 0.50–1.10)
GFR calc Af Amer: 81 mL/min — ABNORMAL LOW (ref >90)
GFR, EST NON AFRICAN AMERICAN: 70 mL/min — AB (ref >90)
GLUCOSE: 105 mg/dL — AB (ref 70–99)
Potassium: 3.9 mEq/L (ref 3.7–5.3)
SODIUM: 141 meq/L (ref 137–147)
TOTAL PROTEIN: 7.6 g/dL (ref 6.0–8.3)
Total Bilirubin: 0.2 mg/dL — ABNORMAL LOW (ref 0.3–1.2)

## 2014-04-22 LAB — LIPASE, BLOOD: LIPASE: 69 U/L — AB (ref 11–59)

## 2014-04-22 LAB — URINALYSIS, ROUTINE W REFLEX MICROSCOPIC
Bilirubin Urine: NEGATIVE
Glucose, UA: NEGATIVE mg/dL
KETONES UR: NEGATIVE mg/dL
Nitrite: NEGATIVE
PROTEIN: NEGATIVE mg/dL
Specific Gravity, Urine: 1.008 (ref 1.005–1.030)
Urobilinogen, UA: 0.2 mg/dL (ref 0.0–1.0)
pH: 6.5 (ref 5.0–8.0)

## 2014-04-22 LAB — CBC WITH DIFFERENTIAL/PLATELET
BASOS PCT: 0 % (ref 0–1)
Basophils Absolute: 0 10*3/uL (ref 0.0–0.1)
EOS ABS: 0.1 10*3/uL (ref 0.0–0.7)
Eosinophils Relative: 1 % (ref 0–5)
HCT: 34.6 % — ABNORMAL LOW (ref 36.0–46.0)
HEMOGLOBIN: 11.2 g/dL — AB (ref 12.0–15.0)
LYMPHS ABS: 2 10*3/uL (ref 0.7–4.0)
Lymphocytes Relative: 32 % (ref 12–46)
MCH: 28.7 pg (ref 26.0–34.0)
MCHC: 32.4 g/dL (ref 30.0–36.0)
MCV: 88.7 fL (ref 78.0–100.0)
MONO ABS: 0.5 10*3/uL (ref 0.1–1.0)
MONOS PCT: 8 % (ref 3–12)
Neutro Abs: 3.6 10*3/uL (ref 1.7–7.7)
Neutrophils Relative %: 59 % (ref 43–77)
Platelets: 257 10*3/uL (ref 150–400)
RBC: 3.9 MIL/uL (ref 3.87–5.11)
RDW: 15.4 % (ref 11.5–15.5)
WBC: 6.2 10*3/uL (ref 4.0–10.5)

## 2014-04-22 LAB — URINE MICROSCOPIC-ADD ON

## 2014-04-22 LAB — GLUCOSE, CAPILLARY: Glucose-Capillary: 108 mg/dL — ABNORMAL HIGH (ref 70–99)

## 2014-04-22 MED ORDER — FUROSEMIDE 40 MG PO TABS
40.0000 mg | ORAL_TABLET | Freq: Every day | ORAL | Status: DC
  Administered 2014-04-22 – 2014-04-24 (×3): 40 mg via ORAL
  Filled 2014-04-22 (×3): qty 1

## 2014-04-22 MED ORDER — LORATADINE 10 MG PO TABS
10.0000 mg | ORAL_TABLET | Freq: Every day | ORAL | Status: DC
  Administered 2014-04-23 – 2014-04-24 (×2): 10 mg via ORAL
  Filled 2014-04-22 (×2): qty 1

## 2014-04-22 MED ORDER — ENOXAPARIN SODIUM 40 MG/0.4ML ~~LOC~~ SOLN
40.0000 mg | SUBCUTANEOUS | Status: DC
  Administered 2014-04-22 – 2014-04-23 (×2): 40 mg via SUBCUTANEOUS
  Filled 2014-04-22 (×3): qty 0.4

## 2014-04-22 MED ORDER — ONDANSETRON HCL 4 MG PO TABS: 4.0000 mg | ORAL_TABLET | Freq: Four times a day (QID) | ORAL | Status: DC | PRN

## 2014-04-22 MED ORDER — PREPARATION H 50 % EX PADS: MEDICATED_PAD | Freq: Four times a day (QID) | CUTANEOUS | Status: DC | PRN

## 2014-04-22 MED ORDER — ALUM & MAG HYDROXIDE-SIMETH 200-200-20 MG/5ML PO SUSP
30.0000 mL | ORAL | Status: DC
  Administered 2014-04-23: 30 mL via ORAL
  Filled 2014-04-22: qty 30

## 2014-04-22 MED ORDER — VITAMIN D3 25 MCG (1000 UNIT) PO TABS
1000.0000 [IU] | ORAL_TABLET | Freq: Every day | ORAL | Status: DC
  Administered 2014-04-23 – 2014-04-24 (×2): 1000 [IU] via ORAL
  Filled 2014-04-22 (×2): qty 1

## 2014-04-22 MED ORDER — HYDROCORTISONE 2.5 % RE CREA
TOPICAL_CREAM | Freq: Two times a day (BID) | RECTAL | Status: DC
  Administered 2014-04-22 – 2014-04-23 (×2): via RECTAL
  Administered 2014-04-24: 1 via RECTAL
  Filled 2014-04-22: qty 28.35

## 2014-04-22 MED ORDER — FAMOTIDINE IN NACL 20-0.9 MG/50ML-% IV SOLN
20.0000 mg | Freq: Two times a day (BID) | INTRAVENOUS | Status: DC
  Administered 2014-04-22 – 2014-04-24 (×4): 20 mg via INTRAVENOUS
  Filled 2014-04-22 (×6): qty 50

## 2014-04-22 MED ORDER — QUETIAPINE FUMARATE 100 MG PO TABS
100.0000 mg | ORAL_TABLET | Freq: Two times a day (BID) | ORAL | Status: DC
  Administered 2014-04-22 – 2014-04-24 (×4): 100 mg via ORAL
  Filled 2014-04-22 (×5): qty 1

## 2014-04-22 MED ORDER — GUAIFENESIN 100 MG/5ML PO SOLN
200.0000 mg | Freq: Three times a day (TID) | ORAL | Status: DC | PRN
  Administered 2014-04-23: 200 mg via ORAL
  Filled 2014-04-22 (×2): qty 10

## 2014-04-22 MED ORDER — BUDESONIDE-FORMOTEROL FUMARATE 160-4.5 MCG/ACT IN AERO
2.0000 | INHALATION_SPRAY | Freq: Two times a day (BID) | RESPIRATORY_TRACT | Status: DC
  Administered 2014-04-23 – 2014-04-24 (×4): 2 via RESPIRATORY_TRACT
  Filled 2014-04-22: qty 6

## 2014-04-22 MED ORDER — IRBESARTAN 300 MG PO TABS
300.0000 mg | ORAL_TABLET | Freq: Every day | ORAL | Status: DC
  Administered 2014-04-23 – 2014-04-24 (×2): 300 mg via ORAL
  Filled 2014-04-22 (×2): qty 1

## 2014-04-22 MED ORDER — WITCH HAZEL-GLYCERIN EX PADS
MEDICATED_PAD | Freq: Four times a day (QID) | CUTANEOUS | Status: DC | PRN
  Filled 2014-04-22: qty 100

## 2014-04-22 MED ORDER — FLUTICASONE PROPIONATE 50 MCG/ACT NA SUSP
2.0000 | Freq: Every day | NASAL | Status: DC
  Administered 2014-04-23 – 2014-04-24 (×2): 2 via NASAL
  Filled 2014-04-22: qty 16

## 2014-04-22 MED ORDER — BIOTENE DRY MOUTH MT LIQD: 15.0000 mL | OROMUCOSAL | Status: DC | PRN

## 2014-04-22 MED ORDER — ALBUTEROL SULFATE (2.5 MG/3ML) 0.083% IN NEBU: 2.5000 mg | INHALATION_SOLUTION | RESPIRATORY_TRACT | Status: DC | PRN

## 2014-04-22 MED ORDER — LAMOTRIGINE 200 MG PO TABS
200.0000 mg | ORAL_TABLET | Freq: Two times a day (BID) | ORAL | Status: DC
  Administered 2014-04-22 – 2014-04-24 (×4): 200 mg via ORAL
  Filled 2014-04-22 (×6): qty 1

## 2014-04-22 MED ORDER — MAGNESIUM OXIDE 400 MG PO TABS
400.0000 mg | ORAL_TABLET | Freq: Every day | ORAL | Status: DC
  Administered 2014-04-23 – 2014-04-24 (×2): 400 mg via ORAL
  Filled 2014-04-22 (×2): qty 1

## 2014-04-22 MED ORDER — VERAPAMIL HCL ER 120 MG PO TBCR
120.0000 mg | EXTENDED_RELEASE_TABLET | Freq: Every day | ORAL | Status: DC
  Administered 2014-04-23 – 2014-04-24 (×2): 120 mg via ORAL
  Filled 2014-04-22 (×2): qty 1

## 2014-04-22 MED ORDER — CLOPIDOGREL BISULFATE 75 MG PO TABS
75.0000 mg | ORAL_TABLET | Freq: Every day | ORAL | Status: DC
  Administered 2014-04-23 – 2014-04-24 (×2): 75 mg via ORAL
  Filled 2014-04-22 (×2): qty 1

## 2014-04-22 MED ORDER — POLYETHYLENE GLYCOL 3350 17 G PO PACK
17.0000 g | PACK | Freq: Every day | ORAL | Status: DC
  Administered 2014-04-22 – 2014-04-24 (×3): 17 g via ORAL
  Filled 2014-04-22 (×3): qty 1

## 2014-04-22 MED ORDER — SODIUM CHLORIDE 0.9 % IV SOLN
INTRAVENOUS | Status: DC
  Administered 2014-04-22: 21:00:00 via INTRAVENOUS

## 2014-04-22 MED ORDER — PANTOPRAZOLE SODIUM 40 MG IV SOLR
40.0000 mg | Freq: Two times a day (BID) | INTRAVENOUS | Status: DC
  Administered 2014-04-22 – 2014-04-24 (×4): 40 mg via INTRAVENOUS
  Filled 2014-04-22 (×5): qty 40

## 2014-04-22 MED ORDER — AMLODIPINE BESYLATE 5 MG PO TABS
5.0000 mg | ORAL_TABLET | Freq: Every day | ORAL | Status: DC
  Administered 2014-04-23 – 2014-04-24 (×2): 5 mg via ORAL
  Filled 2014-04-22 (×2): qty 1

## 2014-04-22 MED ORDER — LORAZEPAM 1 MG PO TABS
1.0000 mg | ORAL_TABLET | Freq: Two times a day (BID) | ORAL | Status: DC
Start: 2014-04-22 — End: 2014-04-24
  Administered 2014-04-22 – 2014-04-24 (×4): 1 mg via ORAL
  Filled 2014-04-22 (×4): qty 1

## 2014-04-22 MED ORDER — ONDANSETRON HCL 4 MG/2ML IJ SOLN
4.0000 mg | Freq: Once | INTRAMUSCULAR | Status: AC
  Administered 2014-04-22: 4 mg via INTRAVENOUS
  Filled 2014-04-22: qty 2

## 2014-04-22 MED ORDER — BISACODYL 10 MG RE SUPP
10.0000 mg | Freq: Once | RECTAL | Status: AC
  Administered 2014-04-22: 10 mg via RECTAL
  Filled 2014-04-22: qty 1

## 2014-04-22 MED ORDER — SUCRALFATE 1 GM/10ML PO SUSP
1.0000 g | Freq: Two times a day (BID) | ORAL | Status: DC
  Administered 2014-04-22 – 2014-04-24 (×4): 1 g via ORAL
  Filled 2014-04-22 (×5): qty 10

## 2014-04-22 MED ORDER — HYDROMORPHONE HCL 1 MG/ML IJ SOLN: 1.0000 mg | INTRAMUSCULAR | Status: DC | PRN

## 2014-04-22 MED ORDER — DONEPEZIL HCL 10 MG PO TABS
10.0000 mg | ORAL_TABLET | Freq: Every day | ORAL | Status: DC
  Administered 2014-04-22 – 2014-04-23 (×2): 10 mg via ORAL
  Filled 2014-04-22 (×3): qty 1

## 2014-04-22 MED ORDER — ATORVASTATIN CALCIUM 40 MG PO TABS
40.0000 mg | ORAL_TABLET | Freq: Every day | ORAL | Status: DC
  Administered 2014-04-23 – 2014-04-24 (×2): 40 mg via ORAL
  Filled 2014-04-22 (×2): qty 1

## 2014-04-22 MED ORDER — SENNOSIDES-DOCUSATE SODIUM 8.6-50 MG PO TABS
1.0000 | ORAL_TABLET | Freq: Two times a day (BID) | ORAL | Status: DC
  Administered 2014-04-22 – 2014-04-24 (×4): 1 via ORAL
  Filled 2014-04-22 (×4): qty 1

## 2014-04-22 MED ORDER — ONDANSETRON HCL 4 MG/2ML IJ SOLN: 4.0000 mg | Freq: Four times a day (QID) | INTRAMUSCULAR | Status: DC | PRN

## 2014-04-22 NOTE — ED Notes (Signed)
Phlebotomy at the bedside  

## 2014-04-22 NOTE — ED Notes (Signed)
MD at the bedside  

## 2014-04-22 NOTE — H&P (Signed)
[facility] History and Physical  Cindy DawnChristine B Capers ZOX:[id number] DOB: 1941-02-29 DOA: 04/22/2014  Referring physician: Dr. Rennis ChrisJacobowitz PCP: Eloisa NorthernAMIN, SAAD, MD   Chief complaint  Abdominal pain since one day  HPI:  73 year old female with history of diastolic dysfunction, mild  dementia, irritable bowel syndrome, history of hemorrhoids, hiatal hernia, bipolar 1 disorder, diabetes mellitus not on any medications, COPD, chronic constipation, GERD who presented to the ED with abdominal distention since this morning it patient reports having epigastric pain with abdominal distention with inability to pass gas. She reports being constipated for past 3 days. She had persistent epigastric discomfort and also regurgitated gastric content in her mouth on 2 occasions today. She reports taking stool softeners and Maalox without much relief.  She reports having similar symptoms 2 weeks back for which she was seen in the ED. Patient denies headache, dizziness, fever, chills, chest pain, palpitations, shortness of breath, urinary symptoms. Denies any sick contact or eating anything outside.  Course in the ED Patient's vitals were stable. Blood work showed wishes 6.2, hemoglobin of 11.2 which was at baseline, normal chemistry with anion gap of 14. Lipase was mildly elevated at 69. LFTs were normal Patient had a CT scan of the abdomen and pelvis with oral contrast (allergies to IV contrast) that showed high stool burden, moderate size ventral hernia in the epigastric region containing a portion of distal stomach which may result into a partial obstruction. After returning from CT patient had a moderate amount blood per rectum without any stool as bleeding physician. Hospitalists admission under observation requested. Surgery consult was called by ED physician who reviewed CT scan and suggested this is more likely secondary to constipation or gaseous distention of the then partial small bowel obstruction  as patient had a reducible ventral hernia. Recommended monitoring clinically and consult surgery if symptoms unimproved.   Review of Systems:  Constitutional: Denies fever, chills, diaphoresis, appetite change and fatigue.  HEENT: Denies visual or hearing symptoms, congestion, difficulty swallowing, sore throat or neck pain  Respiratory: Denies SOB, DOE, cough, chest tightness,  and wheezing.   Cardiovascular: Denies chest pain, palpitations and leg swelling.  Gastrointestinal: Nausea, abdominal pain and distention, regurgitation of gastric contents, constipation, rectal bleed.   Genitourinary: Denies dysuria, hematuria, flank pain and difficulty urinating.  Endocrine: Denies  polyuria, polydipsia. Musculoskeletal: Denies myalgias, back pain, joint pain Skin: Denies  rash and wound.  Neurological: Denies dizziness, weakness,and headaches.  Psychiatric/Behavior: denies confusion   Past Medical History  Diagnosis Date  . Hypertension   . GERD (gastroesophageal reflux disease)   . Dementia   . IBS (irritable bowel syndrome)   . Arthritis   . Constipation   . Hiatal hernia   . Bipolar 1 disorder   . Diabetes mellitus   . Bronchitis   . Chronic back pain    Past Surgical History  Procedure Laterality Date  . Gallbladder    . Partial hysterectomy    . Mandible surgery     Social History:  reports that she quit smoking about 54 years ago. Her smoking use included Cigarettes. She has a 1 pack-year smoking history. She quit smokeless tobacco use about 54 years ago. Her smokeless tobacco use included Snuff. She reports that she does not drink alcohol or use illicit drugs.  Allergies  Allergen Reactions  . Aspirin Other (See Comments)    unknown  . Codeine Other (See Comments)    unknown  . Ivp Dye [Iodinated Diagnostic Agents] Other (See Comments)  unknown  . Morphine And Related Other (See Comments)    unknown  . Neomycin Other (See Comments)    unknown  . Penicillins Other  (See Comments)    unknown  . Promethazine Other (See Comments)    unknown  . Sulfa Antibiotics Other (See Comments)    unknown  . Tetracyclines & Related Other (See Comments)    unknown    Family History  Problem Relation Age of Onset  . Emphysema Mother   . Cancer Other     Prior to Admission medications   Medication Sig Start Date End Date Taking? Authorizing Provider  acetaminophen (TYLENOL) 500 MG tablet Take 500 mg by mouth 3 (three) times daily.    Yes Historical Provider, MD  albuterol (PROVENTIL HFA;VENTOLIN HFA) 108 (90 BASE) MCG/ACT inhaler Inhale 2 puffs into the lungs every 4 (four) hours as needed for wheezing or shortness of breath.   Yes Historical Provider, MD  albuterol (PROVENTIL) (2.5 MG/3ML) 0.083% nebulizer solution Take 2.5 mg by nebulization every 2 (two) hours as needed for wheezing or shortness of breath.   Yes Historical Provider, MD  alum & mag hydroxide-simeth (GERI-LANTA) 200-200-20 MG/5ML suspension Take 30 mLs by mouth See admin instructions. Give 1 hour after meals and at bedtime as needed for indigestion   Yes Historical Provider, MD  amLODipine (NORVASC) 5 MG tablet Take 5 mg by mouth daily.   Yes Historical Provider, MD  antiseptic oral rinse (BIOTENE) LIQD 15 mLs by Mouth Rinse route as needed for dry mouth.   Yes Historical Provider, MD  atorvastatin (LIPITOR) 40 MG tablet Take 40 mg by mouth daily.    Yes Historical Provider, MD  budesonide-formoterol (SYMBICORT) 160-4.5 MCG/ACT inhaler Inhale 2 puffs into the lungs 2 (two) times daily. Rinse mouth after use   Yes Historical Provider, MD  cholecalciferol (VITAMIN D) 1000 UNITS tablet Take 1,000 Units by mouth daily.    Yes Historical Provider, MD  clopidogrel (PLAVIX) 75 MG tablet Take 75 mg by mouth daily.    Yes Historical Provider, MD  donepezil (ARICEPT) 10 MG tablet Take 10 mg by mouth at bedtime.    Yes Historical Provider, MD  fluticasone (FLONASE) 50 MCG/ACT nasal spray Place 2 sprays into  both nostrils daily.    Yes Historical Provider, MD  furosemide (LASIX) 20 MG tablet Take 20 mg by mouth daily with breakfast.   Yes Historical Provider, MD  lamoTRIgine (LAMICTAL) 200 MG tablet Take 200 mg by mouth 2 (two) times daily.   Yes Historical Provider, MD  loratadine (CLARITIN) 10 MG tablet Take 10 mg by mouth daily.    Yes Historical Provider, MD  LORazepam (ATIVAN) 1 MG tablet Take 1 mg by mouth every 12 (twelve) hours.   Yes Historical Provider, MD  magnesium oxide (MAG-OX) 400 MG tablet Take 400 mg by mouth daily.    Yes Historical Provider, MD  pantoprazole (PROTONIX) 20 MG tablet Take 20 mg by mouth daily.   Yes Historical Provider, MD  polyethylene glycol (MIRALAX / GLYCOLAX) packet Take 17 g by mouth every other day. Dissolve in 6 oz of fluid and drink   Yes Historical Provider, MD  QUEtiapine (SEROQUEL) 100 MG tablet Take 100 mg by mouth 2 (two) times daily. Hold for sedation   Yes Historical Provider, MD  ranitidine (ZANTAC) 150 MG tablet Take 150 mg by mouth 2 (two) times daily.   Yes Historical Provider, MD  senna-docusate (SENOKOT-S) 8.6-50 MG per tablet Take 1 tablet by mouth  2 (two) times daily.   Yes Historical Provider, MD  sucralfate (CARAFATE) 1 GM/10ML suspension Take 1 g by mouth 2 (two) times daily.   Yes Historical Provider, MD  valsartan (DIOVAN) 320 MG tablet Take 1 tablet (320 mg total) by mouth daily. 04/16/13  Yes Nyoka Cowden, MD  verapamil (CALAN-SR) 120 MG CR tablet Take 120 mg by mouth daily.    Yes Historical Provider, MD  Witch Hazel (PREPARATION H EX) Apply 1 application topically 4 (four) times daily as needed (Hemorrhoidal pain.).   Yes Historical Provider, MD  furosemide (LASIX) 40 MG tablet Take 1 tablet (40 mg total) by mouth daily. 03/21/14   Mora Bellman, PA-C  guaiFENesin (ROBITUSSIN) 100 MG/5ML liquid Take 10 mLs (200 mg total) by mouth 3 (three) times daily as needed for cough. 01/24/14   Fayrene Helper, PA-C     Physical Exam:  Filed  Vitals:   04/22/14 1530 04/22/14 1645 04/22/14 1730 04/22/14 1746  BP: 161/64   143/55  Pulse:  82 65 67  Temp:      TempSrc:      Resp: 15 19 14 18   Height:      Weight:      SpO2:  96% 96% 96%    Constitutional: Vital signs reviewed.   elderly female in no acute distress HEENT: No pallor, no icterus, moist oral mucosa CVS: RRR, S1 normal, S2 normal, no MRG Chest: CTAB, no wheezes, rales, or rhonchi Abdominal: Distended, epigastric tenderness to palpation, bowel sounds present, reducible anterior ventral hernia Extremities: Warm, trace edema bilaterally CNS: Alert and oriented     Labs on Admission:  Basic Metabolic Panel:  Recent Labs Lab 04/22/14 1520  NA 141  K 3.9  CL 99  CO2 28  GLUCOSE 105*  BUN 16  CREATININE 0.81  CALCIUM 9.2   Liver Function Tests:  Recent Labs Lab 04/22/14 1520  AST 20  ALT 17  ALKPHOS 99  BILITOT 0.2*  PROT 7.6  ALBUMIN 3.6    Recent Labs Lab 04/22/14 1520  LIPASE 69*   No results for input(s): AMMONIA in the last 168 hours. CBC:  Recent Labs Lab 04/22/14 1520  WBC 6.2  NEUTROABS 3.6  HGB 11.2*  HCT 34.6*  MCV 88.7  PLT 257   Cardiac Enzymes: No results for input(s): CKTOTAL, CKMB, CKMBINDEX, TROPONINI in the last 168 hours. BNP: Invalid input(s): POCBNP CBG: No results for input(s): GLUCAP in the last 168 hours.  Radiological Exams on Admission: Ct Abdomen Pelvis Wo Contrast  04/22/2014   CLINICAL DATA:  Generalized abdominal pain.  EXAM: CT ABDOMEN AND PELVIS WITHOUT CONTRAST  TECHNIQUE: Multidetector CT imaging of the abdomen and pelvis was performed following the standard protocol without IV contrast.  COMPARISON:  CT scan of July 30, 2010.  FINDINGS: Mild multilevel degenerative disc disease is noted in the lower lumbar spine. Visualized lung bases appear normal.  Stable cysts are noted in the hepatic parenchyma. The spleen and pancreas appear normal. Status post cholecystectomy. Adrenal glands and kidneys  appear normal. No hydronephrosis or renal obstruction is noted. No renal or ureteral calculi are noted. Stool is noted throughout the colon. Moderate size ventral hernia is noted and epigastric region which contains a portion of the stomach and may be resulting in partial obstruction. Urinary bladder appears normal. Status post hysterectomy. Ovaries appear normal. No significant adenopathy is noted. Atherosclerotic calcifications of abdominal aorta are noted without aneurysm formation.  IMPRESSION: Stool is noted throughout the  colon which may represent constipation.  Moderate size ventral hernia is noted and epigastric region, which contains a portion of the distal stomach and may be resulting and partial obstruction.   Electronically Signed   By: Roque LiasJames  Green M.D.   On: 04/22/2014 17:15      Assessment/Plan Principal Problem:   Abdominal pain with? Partial small bowel obstruction Admit under observation. -CT scan was reviewed by ED physician with surgery Dr. Derrell Lollingamirez and recommended unlikely to have partial small bowel obstruction and to monitor clinically -Symptoms could be related to ongoing constipation with abdominal distention -We'll resume home bowel regimen medications. Ordered Dulcolax suppository. -Supportive care with gentle IV hydration, antiemetics, IV PPI, IV Pepcid and when necessary Dilaudid for pain -Serial abdominal exam. Repeat x-ray in the morning -Check lactic acid level. -Surgery consult if symptoms unimproved or getting worse.   Active Problems:   HTN (hypertension) Blood pressure stable. Continue home blood pressure medications    Diastolic heart failure Has trace bilateral pedal edema. Continue Lasix and losartan. On gentle hydration. Monitor for any signs of fluid overload  Dementia  likely at baseline.   Rectal bleed Possibly in the setting of hemorrhoids. Will monitor clinically. H&H stable at baseline Ordered anusol cream and Preparation H.    asthma/COPD Stable. Continue home inhalers and nebs    type 2 diabetes mellitus Not on any medications. place sliding scale insulin.    DieClear liquids  DVT prophylaxis: sq lovenox   Code Status: Full code Family Communication: None at bedside Disposition : admit under observation  Eddie NorthDHUNGEL, Anaira Seay [facility] Pager [phone]  time spent on admission :50 minutes  If 7PM-7AM, please contact night-coverage [email or web address] Password [email or web address] 04/22/2014, 6:10 PM

## 2014-04-22 NOTE — ED Notes (Signed)
MD Jacubowitz at the bedside   

## 2014-04-22 NOTE — ED Notes (Signed)
Internal Medicine at the bedside. 

## 2014-04-22 NOTE — ED Notes (Signed)
Patient transported to CT 

## 2014-04-22 NOTE — ED Provider Notes (Signed)
CSN: [id number]     Arrival date & time 04/22/14  1328 History   First MD Initiated Contact with Patient 04/22/14 1340     Chief Complaint  Patient presents with  . Abdominal Pain     (Consider location/radiation/quality/duration/timing/severity/associated sxs/prior Treatment) HPI Complains of abdominal bloating and has trouble passing gas for the past 2 weeks. She states "I vomited into my mouth couple of times today" she is not nauseated at present she denies any chest pain denies fever. She is been treated with Prilosec, without relief. She did have a slight bowel movement this morning. He has not passed gas since this morning. No other associated symptoms. Nothing makes symptoms better or worse. She denies shortness of breath denies cough Past Medical History  Diagnosis Date  . Hypertension   . GERD (gastroesophageal reflux disease)   . Dementia   . IBS (irritable bowel syndrome)   . Arthritis   . Constipation   . Hiatal hernia   . Bipolar 1 disorder   . Diabetes mellitus   . Bronchitis   . Chronic back pain    Past Surgical History  Procedure Laterality Date  . Gallbladder    . Partial hysterectomy    . Mandible surgery     Family History  Problem Relation Age of Onset  . Emphysema Mother   . Cancer Other    History  Substance Use Topics  . Smoking status: Former Smoker -- 0.50 packs/day for 2 years    Types: Cigarettes    Quit date: 05/28/1959  . Smokeless tobacco: Former Neurosurgeon    Types: Snuff    Quit date: 05/28/1959     Comment: Pt used snuff as a teenager  . Alcohol Use: No   OB History    No data available     Review of Systems  Constitutional: Negative.   HENT: Negative.   Respiratory: Negative.   Cardiovascular: Negative.   Gastrointestinal: Positive for abdominal pain, constipation, blood in stool, abdominal distention, anal bleeding and rectal pain.       Pain at hemorrhoids, with some bleeding from hemorrhoids  Musculoskeletal: Negative.    Skin: Negative.   Neurological: Negative.   Hematological: Does not bruise/bleed easily.  Psychiatric/Behavioral: Negative.   All other systems reviewed and are negative.     Allergies  Aspirin; Codeine; Ivp dye; Morphine and related; Neomycin; Penicillins; Promethazine; Sulfa antibiotics; and Tetracyclines & related  Home Medications   Prior to Admission medications   Medication Sig Start Date End Date Taking? Authorizing Provider  acetaminophen (TYLENOL) 500 MG tablet Take 500 mg by mouth every 8 (eight) hours as needed for moderate pain.     Historical Provider, MD  albuterol (PROVENTIL HFA;VENTOLIN HFA) 108 (90 BASE) MCG/ACT inhaler Inhale 2 puffs into the lungs every 4 (four) hours as needed for wheezing or shortness of breath.    Historical Provider, MD  albuterol (PROVENTIL) (2.5 MG/3ML) 0.083% nebulizer solution Take 2.5 mg by nebulization every 2 (two) hours as needed for wheezing or shortness of breath.    Historical Provider, MD  alum & mag hydroxide-simeth (GERI-LANTA) 200-200-20 MG/5ML suspension Take 30 mLs by mouth See admin instructions. Give 1 hour after meals and at bedtime as needed for indigestion    Historical Provider, MD  amLODipine (NORVASC) 5 MG tablet Take 5 mg by mouth daily.    Historical Provider, MD  antiseptic oral rinse (BIOTENE) LIQD 15 mLs by Mouth Rinse route at bedtime.    Historical Provider, MD  atorvastatin (LIPITOR) 40 MG tablet Take 40 mg by mouth daily.     Historical Provider, MD  budesonide-formoterol (SYMBICORT) 160-4.5 MCG/ACT inhaler Inhale 2 puffs into the lungs 2 (two) times daily. Rinse mouth after use    Historical Provider, MD  cholecalciferol (VITAMIN D) 1000 UNITS tablet Take 1,000 Units by mouth daily.     Historical Provider, MD  clopidogrel (PLAVIX) 75 MG tablet Take 75 mg by mouth daily.     Historical Provider, MD  donepezil (ARICEPT) 10 MG tablet Take 10 mg by mouth at bedtime.     Historical Provider, MD  fluticasone (FLONASE)  50 MCG/ACT nasal spray Place 2 sprays into both nostrils daily.     Historical Provider, MD  furosemide (LASIX) 20 MG tablet Take 20 mg by mouth daily with breakfast.    Historical Provider, MD  furosemide (LASIX) 40 MG tablet Take 1 tablet (40 mg total) by mouth daily. 03/21/14   Mora BellmanHannah S Merrell, PA-C  guaiFENesin (ROBITUSSIN) 100 MG/5ML liquid Take 10 mLs (200 mg total) by mouth 3 (three) times daily as needed for cough. 01/24/14   Fayrene HelperBowie Tran, PA-C  lamoTRIgine (LAMICTAL) 200 MG tablet Take 200 mg by mouth 2 (two) times daily.    Historical Provider, MD  loperamide (IMODIUM) 2 MG capsule Take 2 mg by mouth as needed for diarrhea or loose stools.     Historical Provider, MD  loratadine (CLARITIN) 10 MG tablet Take 10 mg by mouth daily.     Historical Provider, MD  LORazepam (ATIVAN) 1 MG tablet Take 1 mg by mouth every 12 (twelve) hours.    Historical Provider, MD  LORazepam (ATIVAN) 1 MG tablet Take 1 mg by mouth daily as needed for anxiety.    Historical Provider, MD  magnesium hydroxide (MILK OF MAGNESIA) 400 MG/5ML suspension Take 30 mLs by mouth at bedtime as needed for mild constipation.    Historical Provider, MD  magnesium oxide (MAG-OX) 400 MG tablet Take 400 mg by mouth daily.     Historical Provider, MD  pantoprazole (PROTONIX) 20 MG tablet Take 20 mg by mouth daily.    Historical Provider, MD  polyethylene glycol (MIRALAX / GLYCOLAX) packet Take 17 g by mouth every other day. Dissolve in 6 oz of fluid and drink    Historical Provider, MD  QUEtiapine (SEROQUEL) 100 MG tablet Take 100 mg by mouth 2 (two) times daily. Hold for sedation    Historical Provider, MD  ranitidine (ZANTAC) 150 MG tablet Take 150 mg by mouth 2 (two) times daily.    Historical Provider, MD  senna-docusate (SENOKOT-S) 8.6-50 MG per tablet Take 1 tablet by mouth 2 (two) times daily.    Historical Provider, MD  valsartan (DIOVAN) 320 MG tablet Take 1 tablet (320 mg total) by mouth daily. 04/16/13   Nyoka CowdenMichael B Wert, MD   verapamil (CALAN-SR) 120 MG CR tablet Take 120 mg by mouth daily.     Historical Provider, MD  Jeanann LewandowskyWitch Hazel (PREPARATION H EX) Apply 1 application topically 4 (four) times daily as needed (Hemorrhoidal pain.).    Historical Provider, MD   BP 127/65 mmHg  Pulse 77  Temp(Src) 98.9 F (37.2 C) (Oral)  Resp 14  Ht 5' (1.524 m)  Wt 178 lb (80.74 kg)  BMI 34.76 kg/m2  SpO2 94% Physical Exam  Constitutional: She appears well-developed and well-nourished.  HENT:  Head: Normocephalic and atraumatic.  Eyes: Conjunctivae are normal. Pupils are equal, round, and reactive to light.  Neck: Neck supple.  No tracheal deviation present. No thyromegaly present.  Cardiovascular: Normal rate and regular rhythm.   No murmur heard. Pulmonary/Chest: Effort normal and breath sounds normal.  Abdominal: Soft. Bowel sounds are normal. She exhibits no distension. There is tenderness.  Minimal diffuse tenderness  Genitourinary: Guaiac positive stool.  External hemorrhoids, nonthrombosed, normal tone and brown stool trace Hemoccult positive  Musculoskeletal: Normal range of motion. She exhibits no edema or tenderness.  Neurological: She is alert. Coordination normal.  Skin: Skin is warm and dry. No rash noted.  Psychiatric: She has a normal mood and affect.  Nursing note and vitals reviewed.   ED Course  Procedures (including critical care time) Labs Review Labs Reviewed - No data to display  Imaging Review No results found.   EKG Interpretation None     Declines pain medicine. Requesting antiemetic medicine while drinking oral contrast  6:45 PM patient continues to complain of nausea after treatment with Zofran. She continues to decline pain medicine. Patient had bowel movement in bedside commode with some red blood in it during ED stay Results for orders placed or performed during the hospital encounter of 04/22/14  Comprehensive metabolic panel  Result Value Ref Range   Sodium 141 137 - 147  mEq/L   Potassium 3.9 3.7 - 5.3 mEq/L   Chloride 99 96 - 112 mEq/L   CO2 28 19 - 32 mEq/L   Glucose, Bld 105 (H) 70 - 99 mg/dL   BUN 16 6 - 23 mg/dL   Creatinine, Ser 1.61 0.50 - 1.10 mg/dL   Calcium 9.2 8.4 - 09.6 mg/dL   Total Protein 7.6 6.0 - 8.3 g/dL   Albumin 3.6 3.5 - 5.2 g/dL   AST 20 0 - 37 U/L   ALT 17 0 - 35 U/L   Alkaline Phosphatase 99 39 - 117 U/L   Total Bilirubin 0.2 (L) 0.3 - 1.2 mg/dL   GFR calc non Af Amer 70 (L) >90 mL/min   GFR calc Af Amer 81 (L) >90 mL/min   Anion gap 14 5 - 15  CBC with Differential  Result Value Ref Range   WBC 6.2 4.0 - 10.5 K/uL   RBC 3.90 3.87 - 5.11 MIL/uL   Hemoglobin 11.2 (L) 12.0 - 15.0 g/dL   HCT 04.5 (L) 40.9 - 81.1 %   MCV 88.7 78.0 - 100.0 fL   MCH 28.7 26.0 - 34.0 pg   MCHC 32.4 30.0 - 36.0 g/dL   RDW 91.4 78.2 - 95.6 %   Platelets 257 150 - 400 K/uL   Neutrophils Relative % 59 43 - 77 %   Neutro Abs 3.6 1.7 - 7.7 K/uL   Lymphocytes Relative 32 12 - 46 %   Lymphs Abs 2.0 0.7 - 4.0 K/uL   Monocytes Relative 8 3 - 12 %   Monocytes Absolute 0.5 0.1 - 1.0 K/uL   Eosinophils Relative 1 0 - 5 %   Eosinophils Absolute 0.1 0.0 - 0.7 K/uL   Basophils Relative 0 0 - 1 %   Basophils Absolute 0.0 0.0 - 0.1 K/uL  Urinalysis, Routine w reflex microscopic  Result Value Ref Range   Color, Urine YELLOW YELLOW   APPearance CLEAR CLEAR   Specific Gravity, Urine 1.008 1.005 - 1.030   pH 6.5 5.0 - 8.0   Glucose, UA NEGATIVE NEGATIVE mg/dL   Hgb urine dipstick TRACE (A) NEGATIVE   Bilirubin Urine NEGATIVE NEGATIVE   Ketones, ur NEGATIVE NEGATIVE mg/dL   Protein, ur NEGATIVE NEGATIVE  mg/dL   Urobilinogen, UA 0.2 0.0 - 1.0 mg/dL   Nitrite NEGATIVE NEGATIVE   Leukocytes, UA TRACE (A) NEGATIVE  Lipase, blood  Result Value Ref Range   Lipase 69 (H) 11 - 59 U/L  Urine microscopic-add on  Result Value Ref Range   Squamous Epithelial / LPF RARE RARE   WBC, UA 0-2 <3 WBC/hpf   RBC / HPF 0-2 <3 RBC/hpf   Bacteria, UA RARE RARE   Ct  Abdomen Pelvis Wo Contrast  04/22/2014   CLINICAL DATA:  Generalized abdominal pain.  EXAM: CT ABDOMEN AND PELVIS WITHOUT CONTRAST  TECHNIQUE: Multidetector CT imaging of the abdomen and pelvis was performed following the standard protocol without IV contrast.  COMPARISON:  CT scan of July 30, 2010.  FINDINGS: Mild multilevel degenerative disc disease is noted in the lower lumbar spine. Visualized lung bases appear normal.  Stable cysts are noted in the hepatic parenchyma. The spleen and pancreas appear normal. Status post cholecystectomy. Adrenal glands and kidneys appear normal. No hydronephrosis or renal obstruction is noted. No renal or ureteral calculi are noted. Stool is noted throughout the colon. Moderate size ventral hernia is noted and epigastric region which contains a portion of the stomach and may be resulting in partial obstruction. Urinary bladder appears normal. Status post hysterectomy. Ovaries appear normal. No significant adenopathy is noted. Atherosclerotic calcifications of abdominal aorta are noted without aneurysm formation.  IMPRESSION: Stool is noted throughout the colon which may represent constipation.  Moderate size ventral hernia is noted and epigastric region, which contains a portion of the distal stomach and may be resulting and partial obstruction.   Electronically Signed   By: Roque LiasJames  Green M.D.   On: 04/22/2014 17:15   Dg Chest 2 View  04/08/2014   CLINICAL DATA:  Mid chest pain since last night worse when coughing or breathing deep Lee  EXAM: CHEST  2 VIEW  COMPARISON:  AP portable chest x-ray of November 13th twenty-fifth at 9:29 a.m. Also PA and lateral chest x-ray of January 24, 2014.  FINDINGS: There is chronic elevation of the right hemidiaphragm. The lungs are adequately inflated. There is no focal infiltrate. There is no pleural effusion or pneumothorax. No abnormal pleural thickening is demonstrated. The heart and pulmonary vascularity are within the limits of normal.  A neurostimulator generator projects over the left mid thorax with the electrode terminating adjacent to C7 on the left. The observed bony thorax is unremarkable.  IMPRESSION: There is chronic elevation of the right hemidiaphragm. There is no acute cardiopulmonary abnormality.   Electronically Signed   By: David  SwazilandJordan   On: 04/08/2014 10:41   Dg Chest Port 1 View  04/08/2014   CLINICAL DATA:  One day history of chest pain and occasional cough  EXAM: PORTABLE CHEST - 1 VIEW  COMPARISON:  January 24, 2014  FINDINGS: Mild elevation of the right hemidiaphragm is stable. There is no edema or consolidation. Heart size and pulmonary vascularity are normal. No adenopathy. Stimulator is seen on the left with lead extending into the neck region. No bone lesions.  IMPRESSION: No edema or consolidation.   Electronically Signed   By: Bretta BangWilliam  Woodruff M.D.   On: 04/08/2014 09:48  6 pm on re-examining pts abdomen, she does have ventral hernia which is soft and easily reducible  MDM  I spoke with Dr.Dhungel who will arrange for inpatient stay. He is requesting surgical consultation.pts anemia is stable I spoke with Dr. Derrell Lollingamirez from general surgery who went  ovoer ct scan . And does not feel hernia is cause of pts sx. He suggests consultation after bowel rest and /or enemas or bowel irrigation. Dx#1 abdominal pain #2 nausea and vomitting #3 rectal bleeding #4 anemia  Final diagnoses:  None        Doug Sou, MD 04/22/14 1811

## 2014-04-22 NOTE — ED Notes (Signed)
CT Tech at the bedside giving contrast.

## 2014-04-22 NOTE — ED Notes (Signed)
Pt c/o generalized abominal pain with nausea with bloating Pain 5/10, c/o being gassy and c/o hemorrhoids.

## 2014-04-22 NOTE — ED Notes (Signed)
Patient encouraged to drink second contrast. Patient remains on the monitor. Patient made aware of the plan of care.

## 2014-04-23 ENCOUNTER — Observation Stay (HOSPITAL_COMMUNITY): Payer: Medicare Other

## 2014-04-23 DIAGNOSIS — K2970 Gastritis, unspecified, without bleeding: Secondary | ICD-10-CM

## 2014-04-23 DIAGNOSIS — K5900 Constipation, unspecified: Secondary | ICD-10-CM

## 2014-04-23 DIAGNOSIS — K5660 Unspecified intestinal obstruction: Secondary | ICD-10-CM | POA: Diagnosis not present

## 2014-04-23 LAB — CBC
HEMATOCRIT: 31.3 % — AB (ref 36.0–46.0)
Hemoglobin: 10.2 g/dL — ABNORMAL LOW (ref 12.0–15.0)
MCH: 28.3 pg (ref 26.0–34.0)
MCHC: 32.6 g/dL (ref 30.0–36.0)
MCV: 86.7 fL (ref 78.0–100.0)
Platelets: 225 10*3/uL (ref 150–400)
RBC: 3.61 MIL/uL — AB (ref 3.87–5.11)
RDW: 15.4 % (ref 11.5–15.5)
WBC: 5.7 10*3/uL (ref 4.0–10.5)

## 2014-04-23 LAB — BASIC METABOLIC PANEL
Anion gap: 12 (ref 5–15)
BUN: 14 mg/dL (ref 6–23)
CHLORIDE: 98 meq/L (ref 96–112)
CO2: 26 meq/L (ref 19–32)
Calcium: 8.8 mg/dL (ref 8.4–10.5)
Creatinine, Ser: 0.92 mg/dL (ref 0.50–1.10)
GFR calc Af Amer: 69 mL/min — ABNORMAL LOW (ref >90)
GFR calc non Af Amer: 60 mL/min — ABNORMAL LOW (ref >90)
GLUCOSE: 101 mg/dL — AB (ref 70–99)
POTASSIUM: 4.2 meq/L (ref 3.7–5.3)
Sodium: 136 mEq/L — ABNORMAL LOW (ref 137–147)

## 2014-04-23 LAB — GLUCOSE, CAPILLARY
GLUCOSE-CAPILLARY: 104 mg/dL — AB (ref 70–99)
Glucose-Capillary: 117 mg/dL — ABNORMAL HIGH (ref 70–99)
Glucose-Capillary: 132 mg/dL — ABNORMAL HIGH (ref 70–99)
Glucose-Capillary: 121 mg/dL — ABNORMAL HIGH (ref 70–99)

## 2014-04-23 LAB — OCCULT BLOOD, POC DEVICE: FECAL OCCULT BLD: POSITIVE — AB

## 2014-04-23 MED ORDER — ALUM & MAG HYDROXIDE-SIMETH 200-200-20 MG/5ML PO SUSP: 30.0000 mL | ORAL | Status: DC | PRN

## 2014-04-23 NOTE — Plan of Care (Signed)
Problem: Phase I Progression Outcomes Goal: Voiding-avoid urinary catheter unless indicated Outcome: Completed/Met Date Met:  04/23/14     

## 2014-04-23 NOTE — Progress Notes (Signed)
UR completed 

## 2014-04-23 NOTE — Plan of Care (Signed)
Problem: Phase II Progression Outcomes Goal: Progress activity as tolerated unless otherwise ordered Outcome: Progressing Goal: Discharge plan established Outcome: Progressing Goal: IV changed to normal saline lock Outcome: Not Applicable Date Met:  97/74/14 Goal: Obtain order to discontinue catheter if appropriate Outcome: Not Applicable Date Met:  23/95/32

## 2014-04-23 NOTE — Progress Notes (Signed)
[facility] PROGRESS NOTE  Cindy Padilla NGE:[id number] DOB: 1940-09-15 DOA: 04/22/2014 PCP: Eloisa NorthernAMIN, SAAD, MD  Assessment/Plan Principal Problem:  Abdominal pain with? Partial small bowel obstruction -CT scan was reviewed by ED physician with surgery Dr. Derrell Lollingamirez and recommended unlikely to have partial small bowel obstruction and to monitor clinically. -Symptoms likely due to constipation and gastritis / hiatal hernia -aggressive bowel regimen. IV PPI BID, IV Pepcid bid, prn maalox. Prn antiemetics. Prn dilaudid for pain -Serial abdominal exam. Repeat x-ray in the morning shows non obstructive bowel gas pattern -Surgery consult if symptoms unimproved or getting worse.   Active Problems:  HTN (hypertension) Blood pressure stable. Continue home blood pressure medications   Diastolic heart failure Has trace bilateral pedal edema. Continue Lasix and losartan. D/c fluids  Dementia likely at baseline.   Rectal bleed Possibly in the setting of hemorrhoids.  H&H stable at baseline Ordered anusol cream and Preparation H. No further symptoms  asthma/COPD Stable. Continue home inhalers and nebs   type 2 diabetes mellitus Not on any medications. Monitor sliding scale insulin.    Diet: full liquids  DVT prophylaxis: sq lovenox   Code Status: Full code Family Communication: None at bedside Disposition : d/c home tomorrow if improved  HPI/Subjective: Still has gaseous distention, but feels better, reports epigastric pain. Had BM today. No further rectal bleed  Objective: Filed Vitals:   04/23/14 0633  BP: 116/51  Pulse: 63  Temp: 97.9 F (36.6 C)  Resp: 20    Intake/Output Summary (Last 24 hours) at 04/23/14 0926 Last data filed at 04/23/14 [id number]  Gross per 24 hour  Intake      0 ml  Output      5 ml  Net     -5 ml   Filed Weights   04/22/14 1339  Weight: 80.74 kg (178 lb)    Exam:  gen: elderly female in no acute distress HEENT: moist oral  mucosa CVS: RRR, S1 normal, S2 normal, no MRG Chest: CTAB, no wheezes, rales, or rhonchi Abd: mildly distended, epigastric tenderness( over the area of ventral hernia which is reducible) , bowel sounds present,  Extremities: Warm, trace edema bilaterally CNS: Alert and oriented   Data Reviewed: Basic Metabolic Panel:  Recent Labs Lab 04/22/14 1520 04/23/14 0447  NA 141 136*  K 3.9 4.2  CL 99 98  CO2 28 26  GLUCOSE 105* 101*  BUN 16 14  CREATININE 0.81 0.92  CALCIUM 9.2 8.8   Liver Function Tests:  Recent Labs Lab 04/22/14 1520  AST 20  ALT 17  ALKPHOS 99  BILITOT 0.2*  PROT 7.6  ALBUMIN 3.6    Recent Labs Lab 04/22/14 1520  LIPASE 69*   No results for input(s): AMMONIA in the last 168 hours. CBC:  Recent Labs Lab 04/22/14 1520 04/23/14 0447  WBC 6.2 5.7  NEUTROABS 3.6  --   HGB 11.2* 10.2*  HCT 34.6* 31.3*  MCV 88.7 86.7  PLT 257 225   Cardiac Enzymes: No results for input(s): CKTOTAL, CKMB, CKMBINDEX, TROPONINI in the last 168 hours. BNP (last 3 results)  Recent Labs  07/24/13 1550 01/24/14 1626 03/20/14 2353  PROBNP 1247.0* 130.0* 189.9*   CBG:  Recent Labs Lab 04/22/14 2134 04/23/14 0842  GLUCAP 108* 104*    No results found for this or any previous visit (from the past 240 hour(s)).   Studies: Ct Abdomen Pelvis Wo Contrast  04/22/2014   CLINICAL DATA:  Generalized abdominal pain.  EXAM: CT  ABDOMEN AND PELVIS WITHOUT CONTRAST  TECHNIQUE: Multidetector CT imaging of the abdomen and pelvis was performed following the standard protocol without IV contrast.  COMPARISON:  CT scan of July 30, 2010.  FINDINGS: Mild multilevel degenerative disc disease is noted in the lower lumbar spine. Visualized lung bases appear normal.  Stable cysts are noted in the hepatic parenchyma. The spleen and pancreas appear normal. Status post cholecystectomy. Adrenal glands and kidneys appear normal. No hydronephrosis or renal obstruction is noted. No renal or  ureteral calculi are noted. Stool is noted throughout the colon. Moderate size ventral hernia is noted and epigastric region which contains a portion of the stomach and may be resulting in partial obstruction. Urinary bladder appears normal. Status post hysterectomy. Ovaries appear normal. No significant adenopathy is noted. Atherosclerotic calcifications of abdominal aorta are noted without aneurysm formation.  IMPRESSION: Stool is noted throughout the colon which may represent constipation.  Moderate size ventral hernia is noted and epigastric region, which contains a portion of the distal stomach and may be resulting and partial obstruction.   Electronically Signed   By: Roque LiasJames  Green M.D.   On: 04/22/2014 17:15   Dg Abd Portable 1v  04/23/2014   CLINICAL DATA:  Subsequent evaluation of bowel obstruction, intermittent pain  EXAM: PORTABLE ABDOMEN - 1 VIEW  COMPARISON:  CT scan performed 04/22/2014  FINDINGS: No abnormally dilated loops of bowel to suggest obstruction. Oral contrast and air throughout the colon and into the rectum.  IMPRESSION: Nonobstructive gas pattern.   Electronically Signed   By: Esperanza Heiraymond  Rubner M.D.   On: 04/23/2014 08:32    Scheduled Meds: . amLODipine  5 mg Oral Daily  . atorvastatin  40 mg Oral Daily  . budesonide-formoterol  2 puff Inhalation BID  . cholecalciferol  1,000 Units Oral Daily  . clopidogrel  75 mg Oral Daily  . donepezil  10 mg Oral QHS  . enoxaparin (LOVENOX) injection  40 mg Subcutaneous Q24H  . famotidine (PEPCID) IV  20 mg Intravenous Q12H  . fluticasone  2 spray Each Nare Daily  . furosemide  40 mg Oral Daily  . hydrocortisone   Rectal BID  . irbesartan  300 mg Oral Daily  . lamoTRIgine  200 mg Oral BID  . loratadine  10 mg Oral Daily  . LORazepam  1 mg Oral Q12H  . magnesium oxide  400 mg Oral Daily  . pantoprazole (PROTONIX) IV  40 mg Intravenous Q12H  . polyethylene glycol  17 g Oral Daily  . QUEtiapine  100 mg Oral BID  . senna-docusate  1  tablet Oral BID  . sucralfate  1 g Oral BID  . verapamil  120 mg Oral Daily   Continuous Infusions: . sodium chloride 50 mL/hr at 04/22/14 2116      Time spent: 25 minutes    Eddie NorthDHUNGEL, Garrison Michie  [facility] Pager [phone] If 7PM-7AM, please contact night-coverage at [email or web address], password [email or web address] 04/23/2014, 9:26 AM  LOS: 1 day

## 2014-04-23 NOTE — Plan of Care (Signed)
Problem: Phase I Progression Outcomes Goal: Pain controlled with appropriate interventions Outcome: Completed/Met Date Met:  04/23/14     

## 2014-04-23 NOTE — Plan of Care (Signed)
Problem: Consults Goal: Skin Care Protocol Initiated - if Braden Score 18 or less If consults are not indicated, leave blank or document N/A  Outcome: Not Applicable Date Met:  04/23/14     

## 2014-04-24 DIAGNOSIS — R1013 Epigastric pain: Secondary | ICD-10-CM

## 2014-04-24 DIAGNOSIS — K5660 Unspecified intestinal obstruction: Secondary | ICD-10-CM | POA: Diagnosis not present

## 2014-04-24 DIAGNOSIS — K439 Ventral hernia without obstruction or gangrene: Secondary | ICD-10-CM | POA: Diagnosis present

## 2014-04-24 DIAGNOSIS — K5900 Constipation, unspecified: Secondary | ICD-10-CM | POA: Diagnosis present

## 2014-04-24 LAB — GLUCOSE, CAPILLARY
GLUCOSE-CAPILLARY: 108 mg/dL — AB (ref 70–99)
Glucose-Capillary: 114 mg/dL — ABNORMAL HIGH (ref 70–99)

## 2014-04-24 MED ORDER — HYDROCORTISONE 2.5 % RE CREA: TOPICAL_CREAM | Freq: Two times a day (BID) | RECTAL | Status: DC

## 2014-04-24 MED ORDER — POLYETHYLENE GLYCOL 3350 17 G PO PACK: 17.0000 g | PACK | Freq: Every day | ORAL | Status: DC

## 2014-04-24 MED ORDER — PANTOPRAZOLE SODIUM 40 MG PO TBEC: 40.0000 mg | DELAYED_RELEASE_TABLET | Freq: Two times a day (BID) | ORAL | Status: DC

## 2014-04-24 NOTE — Discharge Instructions (Signed)
Ventral Hernia A ventral hernia (also called an incisional hernia) is a hernia that occurs at the site of a previous surgical cut (incision) in the abdomen. The abdominal wall spans from your lower chest down to your pelvis. If the abdominal wall is weakened from a surgical incision, a hernia can occur. A hernia is a bulge of bowel or muscle tissue pushing out on the weakened part of the abdominal wall. Ventral hernias can get bigger from straining or lifting. Obese and older people are at higher risk for a ventral hernia. People who develop infections after surgery or require repeat incisions at the same site on the abdomen are also at increased risk. CAUSES  A ventral hernia occurs because of weakness in the abdominal wall at an incision site.  SYMPTOMS  Common symptoms include:  A visible bulge or lump on the abdominal wall.  Pain or tenderness around the lump.  Increased discomfort if you cough or make a sudden movement. If the hernia has blocked part of the intestine, a serious complication can occur (incarcerated or strangulated hernia). This can become a problem that requires emergency surgery because the blood flow to the blocked intestine may be cut off. Symptoms may include:  Feeling sick to your stomach (nauseous).  Throwing up (vomiting).  Stomach swelling (distention) or bloating.  Fever.  Rapid heartbeat. DIAGNOSIS  Your health care provider will take a medical history and perform a physical exam. Various tests may be ordered, such as:  Blood tests.  Urine tests.  Ultrasonography.  X-rays.  Computed tomography (CT). TREATMENT  Watchful waiting may be all that is needed for a smaller hernia that does not cause symptoms. Your health care provider may recommend the use of a supportive belt (truss) that helps to keep the abdominal wall intact. For larger hernias or those that cause pain, surgery to repair the hernia is usually recommended. If a hernia becomes  strangulated, emergency surgery needs to be done right away. HOME CARE INSTRUCTIONS  Avoid putting pressure or strain on the abdominal area.  Avoid heavy lifting.  Use good body positioning for physical tasks. Ask your health care provider about proper body positioning.  Use a supportive belt as directed by your health care provider.  Maintain a healthy weight.  Eat foods that are high in fiber, such as whole grains, fruits, and vegetables. Fiber helps prevent difficult bowel movements (constipation).  Drink enough fluids to keep your urine clear or pale yellow.  Follow up with your health care provider as directed. SEEK MEDICAL CARE IF:   Your hernia seems to be getting larger or more painful. SEEK IMMEDIATE MEDICAL CARE IF:   You have abdominal pain that is sudden and sharp.  Your pain becomes severe.  You have repeated vomiting.  You are sweating a lot.  You notice a rapid heartbeat.  You develop a fever. MAKE SURE YOU:   Understand these instructions.  Will watch your condition.  Will get help right away if you are not doing well or get worse. Document Released: 04/29/2012 Document Revised: 09/27/2013 Document Reviewed: 04/29/2012 Exit[employer] Patient Information 2015 [employer]. This information is not intended to replace advice given to you by your health care provider. Make sure you discuss any questions you have with your health care provider.  

## 2014-04-24 NOTE — Clinical Social Work Note (Signed)
CSW made aware patient ready for d/c to [facility]. CSW contacted facility and spoke with Joellen Jersey who confirmed bed availability for patient. CSW met with patient who was alert and oriented. Patient is agreeable to return to facility. Patient states daughter is out of town, and she will make her aware that she has returned to facility on Monday, 11/30. Patient faxed d/c summary to facility and prepared d/c packet. CSW placed d/c packet in patient's shadow chart. CSW provided RN Joellen Jersey) with number for report. CSW to arrange transportation via [place]. No further needs. CSW signing off.   [place], Ethete Weekend Clinical Social Worker [phone]

## 2014-04-24 NOTE — Discharge Summary (Addendum)
Physician Discharge Summary  Cindy Padilla ZOX:[id number] DOB: 11/27/1940 DOA: 04/22/2014  PCP: Eloisa Northern, MD   Primary gastroenterologist: Dr. Carla Drape in [place]  Admit date: 04/22/2014 Discharge date: 04/24/2014  Time spent: 25 minutes  Recommendations for Outpatient Follow-up:  1. Discharge home with outpatient PCP and GI follow-up   Discharge Diagnoses:   Principal Problem:   epigastric pain with ? Partial small bowel obstruction  Active Problems:   HTN (hypertension)   Diastolic heart failure   Dementia with behavioral disturbance   Intrinsic asthma   Hemorrhoids   Ventral hernia   Constipation   Abdominal pain, epigastric   Discharge Condition: Fair  Diet recommendation: Heart healthy with high fiber  CODE STATUS: DO NOT RESUSCITATE  Filed Weights   04/22/14 1339  Weight: 80.74 kg (178 lb)    History of present illness:  Please refer to admission H&P for details, but in brief,  73 year old female with history of diastolic dysfunction, mild dementia, irritable bowel syndrome, history of hemorrhoids, hiatal hernia, bipolar 1 disorder, diabetes mellitus not on any medications, COPD, chronic constipation, GERD who presented to the ED with abdominal distention since the morning of admission. patient reports having epigastric pain with abdominal distention with inability to pass gas. She reports being constipated for past 3 days. She had persistent epigastric discomfort and also regurgitated gastric content in her mouth on 2 occasions today. She reports taking stool softeners and Maalox without much relief.  She reports having similar symptoms 2 weeks back for which she was seen in the ED. Patient denies headache, dizziness, fever, chills, chest pain, palpitations, shortness of breath, urinary symptoms. Denies any sick contact or eating anything outside.  Course in the ED Patient's vitals were stable. Blood work showed wishes 6.2, hemoglobin of  11.2 which was at baseline, normal chemistry with anion gap of 14. Lipase was mildly elevated at 69. LFTs were normal Patient had a CT scan of the abdomen and pelvis with oral contrast (allergies to IV contrast) that showed high stool burden, moderate size ventral hernia in the epigastric region containing a portion of distal stomach which may result into a partial obstruction. After returning from CT patient had a moderate amount blood per rectum. Hospitalists admission under observation requested. Surgery consult was called by ED physician who reviewed CT scan and suggested this is more likely secondary to constipation or gaseous distention of the then partial small bowel obstruction as patient had a reducible ventral hernia. Recommended monitoring clinically and consult surgery if symptoms unimproved.  Hospital Course:  Principal Problem:  Abdominal pain with? Partial small bowel obstruction -CT scan was reviewed by ED physician with surgery Dr. Derrell Lolling and recommended unlikely to have partial small bowel obstruction and to monitor clinically. -Symptoms likely due to constipation and gastritis / ventral hernia -aggressive bowel regimen. Placed on IV PPI BID, IV Pepcid bid, prn maalox. Prn antiemetics. Prn dilaudid for pain -Serial abdominal exam. Repeat x-ray shows non obstructive bowel gas pattern -Patient having bowel movements and abdominal pain and distention has now resolved. Diet advanced to regular.  -I will increase her protonix dose to 40 mg twice  a day for 4 weeks ( or until seen by her GI), continue twice a day ranitidine and Carafate. Continue with Maalox when necessary. Advised on a high-fiber diet. Continue senna-Colace. Given chronic constipation will add daily MiraLAX. -Patient advised to follow-up with her gastroenterologist Dr. Opal Sidles in about 2 weeks.   Active Problems:  HTN (hypertension) Blood pressure  stable. Continue home blood pressure medications   Diastolic  heart failure Currently euvolemic. Continue Lasix and losartan.   Dementia likely at baseline.   Rectal bleed Possibly in the setting of hemorrhoids. Had only one episode in the ED. H&H stable at baseline Ordered anusol cream and Preparation H. No further symptoms.  asthma/COPD Stable. Continue home inhalers and nebs   type 2 diabetes mellitus Not on any medications. FSG stable         Code Status: Full code Family Communication: None at bedside Disposition : d/c back to assist living      Discharge Exam: Filed Vitals:   04/24/14 0453  BP: 126/59  Pulse: 60  Temp: 98.3 F (36.8 C)  Resp: 18    gen: elderly female in no acute distress HEENT: moist oral mucosa CVS: RRR, S1 normal, S2 normal, no MRG Chest: CTAB, no wheezes, rales, or rhonchi Abd: Nondistended, reducible ventral hernia, nontender, bowel sounds present Extremities: Warm, no edema CNS: Alert and oriented   Discharge Instructions You were cared for by a hospitalist during your hospital stay. If you have any questions about your discharge medications or the care you received while you were in the hospital after you are discharged, you can call the unit and asked to speak with the hospitalist on call if the hospitalist that took care of you is not available. Once you are discharged, your primary care physician will handle any further medical issues. Please note that NO REFILLS for any discharge medications will be authorized once you are discharged, as it is imperative that you return to your primary care physician (or establish a relationship with a primary care physician if you do not have one) for your aftercare needs so that they can reassess your need for medications and monitor your lab values.   Current Discharge Medication List    New medication Hydrocortisone ( Anusol-HC) 2.5% cream apply rectally 2 times a day.   CONTINUE these medications which have CHANGED   Details  pantoprazole  (PROTONIX) 40 MG tablet Take 1 tablet (40 mg total) by mouth 2 (two) times daily. Qty: 30 tablet, Refills: 0    polyethylene glycol (MIRALAX / GLYCOLAX) packet Take 17 g by mouth daily. Dissolve in 6 oz of fluid and drink Qty: 14 each, Refills: 0      CONTINUE these medications which have NOT CHANGED   Details  acetaminophen (TYLENOL) 500 MG tablet Take 500 mg by mouth 3 (three) times daily.     albuterol (PROVENTIL HFA;VENTOLIN HFA) 108 (90 BASE) MCG/ACT inhaler Inhale 2 puffs into the lungs every 4 (four) hours as needed for wheezing or shortness of breath.    albuterol (PROVENTIL) (2.5 MG/3ML) 0.083% nebulizer solution Take 2.5 mg by nebulization every 2 (two) hours as needed for wheezing or shortness of breath.    alum & mag hydroxide-simeth (GERI-LANTA) 200-200-20 MG/5ML suspension Take 30 mLs by mouth See admin instructions. Give 1 hour after meals and at bedtime as needed for indigestion    amLODipine (NORVASC) 5 MG tablet Take 5 mg by mouth daily.    antiseptic oral rinse (BIOTENE) LIQD 15 mLs by Mouth Rinse route as needed for dry mouth.    atorvastatin (LIPITOR) 40 MG tablet Take 40 mg by mouth daily.     budesonide-formoterol (SYMBICORT) 160-4.5 MCG/ACT inhaler Inhale 2 puffs into the lungs 2 (two) times daily. Rinse mouth after use    cholecalciferol (VITAMIN D) 1000 UNITS tablet Take 1,000 Units by mouth daily.  clopidogrel (PLAVIX) 75 MG tablet Take 75 mg by mouth daily.     donepezil (ARICEPT) 10 MG tablet Take 10 mg by mouth at bedtime.     fluticasone (FLONASE) 50 MCG/ACT nasal spray Place 2 sprays into both nostrils daily.     furosemide (LASIX) 20 MG tablet Take 20 mg by mouth daily with breakfast.    lamoTRIgine (LAMICTAL) 200 MG tablet Take 200 mg by mouth 2 (two) times daily.    loratadine (CLARITIN) 10 MG tablet Take 10 mg by mouth daily.     LORazepam (ATIVAN) 1 MG tablet Take 1 mg by mouth every 12 (twelve) hours.    magnesium oxide (MAG-OX) 400 MG  tablet Take 400 mg by mouth daily.     QUEtiapine (SEROQUEL) 100 MG tablet Take 100 mg by mouth 2 (two) times daily. Hold for sedation    ranitidine (ZANTAC) 150 MG tablet Take 150 mg by mouth 2 (two) times daily.    senna-docusate (SENOKOT-S) 8.6-50 MG per tablet Take 1 tablet by mouth 2 (two) times daily.    sucralfate (CARAFATE) 1 GM/10ML suspension Take 1 g by mouth 2 (two) times daily.    valsartan (DIOVAN) 320 MG tablet Take 1 tablet (320 mg total) by mouth daily. Qty: 30 tablet, Refills: 11    verapamil (CALAN-SR) 120 MG CR tablet Take 120 mg by mouth daily.     Witch Hazel (PREPARATION H EX) Apply 1 application topically 4 (four) times daily as needed (Hemorrhoidal pain.).    guaiFENesin (ROBITUSSIN) 100 MG/5ML liquid Take 10 mLs (200 mg total) by mouth 3 (three) times daily as needed for cough. Qty: 120 mL, Refills: 0       Allergies  Allergen Reactions  . Aspirin Other (See Comments)    unknown  . Codeine Other (See Comments)    unknown  . Ivp Dye [Iodinated Diagnostic Agents] Other (See Comments)    unknown  . Morphine And Related Other (See Comments)    unknown  . Neomycin Other (See Comments)    unknown  . Penicillins Other (See Comments)    unknown  . Promethazine Other (See Comments)    unknown  . Sulfa Antibiotics Other (See Comments)    unknown  . Tetracyclines & Related Other (See Comments)    unknown   Follow-up Information    Follow up with AMIN, SAAD, MD In 1 week.   Specialty:  Internal Medicine   Contact information:   [address] [place] [address] [phone]       Follow up with [facility]. Schedule an appointment as soon as possible for a visit in 2 weeks.   Contact information:   [address] [place] [address] [phone]        The results of significant diagnostics from this hospitalization (including imaging, microbiology, ancillary and laboratory) are listed below for reference.     Significant Diagnostic Studies: Ct Abdomen Pelvis Wo Contrast  04/22/2014   CLINICAL DATA:  Generalized abdominal pain.  EXAM: CT ABDOMEN AND PELVIS WITHOUT CONTRAST  TECHNIQUE: Multidetector CT imaging of the abdomen and pelvis was performed following the standard protocol without IV contrast.  COMPARISON:  CT scan of July 30, 2010.  FINDINGS: Mild multilevel degenerative disc disease is noted in the lower lumbar spine. Visualized lung bases appear normal.  Stable cysts are noted in the hepatic parenchyma. The spleen and pancreas appear normal. Status post cholecystectomy. Adrenal glands and kidneys appear normal. No  hydronephrosis or renal obstruction is noted. No renal or ureteral calculi are noted. Stool is noted throughout the colon. Moderate size ventral hernia is noted and epigastric region which contains a portion of the stomach and may be resulting in partial obstruction. Urinary bladder appears normal. Status post hysterectomy. Ovaries appear normal. No significant adenopathy is noted. Atherosclerotic calcifications of abdominal aorta are noted without aneurysm formation.  IMPRESSION: Stool is noted throughout the colon which may represent constipation.  Moderate size ventral hernia is noted and epigastric region, which contains a portion of the distal stomach and may be resulting and partial obstruction.   Electronically Signed   By: Roque Lias M.D.   On: 04/22/2014 17:15   Dg Chest 2 View  04/08/2014   CLINICAL DATA:  Mid chest pain since last night worse when coughing or breathing deep Lee  EXAM: CHEST  2 VIEW  COMPARISON:  AP portable chest x-ray of November 13th twenty-fifth at 9:29 a.m. Also PA and lateral chest x-ray of January 24, 2014.  FINDINGS: There is chronic elevation of the right hemidiaphragm. The lungs are adequately inflated. There is no focal infiltrate. There is no pleural effusion or pneumothorax. No abnormal pleural thickening is demonstrated. The heart and pulmonary  vascularity are within the limits of normal. A neurostimulator generator projects over the left mid thorax with the electrode terminating adjacent to C7 on the left. The observed bony thorax is unremarkable.  IMPRESSION: There is chronic elevation of the right hemidiaphragm. There is no acute cardiopulmonary abnormality.   Electronically Signed   By: David  [place]   On: 04/08/2014 10:41   Dg Chest Port 1 View  04/08/2014   CLINICAL DATA:  One day history of chest pain and occasional cough  EXAM: PORTABLE CHEST - 1 VIEW  COMPARISON:  January 24, 2014  FINDINGS: Mild elevation of the right hemidiaphragm is stable. There is no edema or consolidation. Heart size and pulmonary vascularity are normal. No adenopathy. Stimulator is seen on the left with lead extending into the neck region. No bone lesions.  IMPRESSION: No edema or consolidation.   Electronically Signed   By: Bretta Bang M.D.   On: 04/08/2014 09:48   Dg Abd Portable 1v  04/23/2014   CLINICAL DATA:  Subsequent evaluation of bowel obstruction, intermittent pain  EXAM: PORTABLE ABDOMEN - 1 VIEW  COMPARISON:  CT scan performed 04/22/2014  FINDINGS: No abnormally dilated loops of bowel to suggest obstruction. Oral contrast and air throughout the colon and into the rectum.  IMPRESSION: Nonobstructive gas pattern.   Electronically Signed   By: Esperanza Heir M.D.   On: 04/23/2014 08:32    Microbiology: No results found for this or any previous visit (from the past 240 hour(s)).   Labs: Basic Metabolic Panel:  Recent Labs Lab 04/22/14 1520 04/23/14 0447  NA 141 136*  K 3.9 4.2  CL 99 98  CO2 28 26  GLUCOSE 105* 101*  BUN 16 14  CREATININE 0.81 0.92  CALCIUM 9.2 8.8   Liver Function Tests:  Recent Labs Lab 04/22/14 1520  AST 20  ALT 17  ALKPHOS 99  BILITOT 0.2*  PROT 7.6  ALBUMIN 3.6    Recent Labs Lab 04/22/14 1520  LIPASE 69*   No results for input(s): AMMONIA in the last 168 hours. CBC:  Recent Labs Lab  04/22/14 1520 04/23/14 0447  WBC 6.2 5.7  NEUTROABS 3.6  --   HGB 11.2* 10.2*  HCT 34.6* 31.3*  MCV 88.7  86.7  PLT 257 225   Cardiac Enzymes: No results for input(s): CKTOTAL, CKMB, CKMBINDEX, TROPONINI in the last 168 hours. BNP: BNP (last 3 results)  Recent Labs  07/24/13 1550 01/24/14 1626 03/20/14 2353  PROBNP 1247.0* 130.0* 189.9*   CBG:  Recent Labs Lab 04/22/14 2134 04/23/14 0842 04/23/14 1207 04/23/14 1921 04/23/14 2138  GLUCAP 108* 104* 117* 132* 121*       Signed:  Joshua Soulier  [facility] 04/24/2014, 8:01 AM

## 2014-04-24 NOTE — Progress Notes (Signed)
UR completed 

## 2014-04-24 NOTE — Progress Notes (Signed)
Nsg Discharge Note  Admit Date:  04/22/2014 Discharge date: 04/24/2014   Cindy Padilla to be D/C'd Home per MD order.  AVS completed.  Copy for chart, and copy for patient signed, and dated. Patient/caregiver able to verbalize understanding.  Discharge Medication:   Medication List    TAKE these medications        acetaminophen 500 MG tablet  Commonly known as:  TYLENOL  Take 500 mg by mouth 3 (three) times daily.     albuterol (2.5 MG/3ML) 0.083% nebulizer solution  Commonly known as:  PROVENTIL  Take 2.5 mg by nebulization every 2 (two) hours as needed for wheezing or shortness of breath.     albuterol 108 (90 BASE) MCG/ACT inhaler  Commonly known as:  PROVENTIL HFA;VENTOLIN HFA  Inhale 2 puffs into the lungs every 4 (four) hours as needed for wheezing or shortness of breath.     amLODipine 5 MG tablet  Commonly known as:  NORVASC  Take 5 mg by mouth daily.     antiseptic oral rinse Liqd  15 mLs by Mouth Rinse route as needed for dry mouth.     atorvastatin 40 MG tablet  Commonly known as:  LIPITOR  Take 40 mg by mouth daily.     budesonide-formoterol 160-4.5 MCG/ACT inhaler  Commonly known as:  SYMBICORT  Inhale 2 puffs into the lungs 2 (two) times daily. Rinse mouth after use     cholecalciferol 1000 UNITS tablet  Commonly known as:  VITAMIN D  Take 1,000 Units by mouth daily.     clopidogrel 75 MG tablet  Commonly known as:  PLAVIX  Take 75 mg by mouth daily.     donepezil 10 MG tablet  Commonly known as:  ARICEPT  Take 10 mg by mouth at bedtime.     fluticasone 50 MCG/ACT nasal spray  Commonly known as:  FLONASE  Place 2 sprays into both nostrils daily.     furosemide 20 MG tablet  Commonly known as:  LASIX  Take 20 mg by mouth daily with breakfast.     GERI-LANTA 200-200-20 MG/5ML suspension  Generic drug:  alum & mag hydroxide-simeth  Take 30 mLs by mouth See admin instructions. Give 1 hour after meals and at bedtime as needed for  indigestion     guaiFENesin 100 MG/5ML liquid  Commonly known as:  ROBITUSSIN  Take 10 mLs (200 mg total) by mouth 3 (three) times daily as needed for cough.     hydrocortisone 2.5 % rectal cream  Commonly known as:  ANUSOL-HC  Place rectally 2 (two) times daily.     lamoTRIgine 200 MG tablet  Commonly known as:  LAMICTAL  Take 200 mg by mouth 2 (two) times daily.     loratadine 10 MG tablet  Commonly known as:  CLARITIN  Take 10 mg by mouth daily.     LORazepam 1 MG tablet  Commonly known as:  ATIVAN  Take 1 mg by mouth every 12 (twelve) hours.     magnesium oxide 400 MG tablet  Commonly known as:  MAG-OX  Take 400 mg by mouth daily.     pantoprazole 40 MG tablet  Commonly known as:  PROTONIX  Take 1 tablet (40 mg total) by mouth 2 (two) times daily.     polyethylene glycol packet  Commonly known as:  MIRALAX / GLYCOLAX  Take 17 g by mouth daily. Dissolve in 6 oz of fluid and drink     PREPARATION H  EX  Apply 1 application topically 4 (four) times daily as needed (Hemorrhoidal pain.).     QUEtiapine 100 MG tablet  Commonly known as:  SEROQUEL  Take 100 mg by mouth 2 (two) times daily. Hold for sedation     ranitidine 150 MG tablet  Commonly known as:  ZANTAC  Take 150 mg by mouth 2 (two) times daily.     senna-docusate 8.6-50 MG per tablet  Commonly known as:  Senokot-S  Take 1 tablet by mouth 2 (two) times daily.     sucralfate 1 GM/10ML suspension  Commonly known as:  CARAFATE  Take 1 g by mouth 2 (two) times daily.     valsartan 320 MG tablet  Commonly known as:  DIOVAN  Take 1 tablet (320 mg total) by mouth daily.     verapamil 120 MG CR tablet  Commonly known as:  CALAN-SR  Take 120 mg by mouth daily.        Discharge Assessment: Filed Vitals:   04/24/14 1006  BP: 147/112  Pulse:   Temp:   Resp:    Skin clean, dry and intact without evidence of skin break down, no evidence of skin tears noted. IV catheter discontinued intact. Site without  signs and symptoms of complications - no redness or edema noted at insertion site, patient denies c/o pain - only slight tenderness at site.  Dressing with slight pressure applied.  D/c Instructions-Education: Discharge instructions given to patient/family with verbalized understanding. D/c education completed with patient/family including follow up instructions, medication list, d/c activities limitations if indicated, with other d/c instructions as indicated by MD - patient able to verbalize understanding, all questions fully answered. Patient instructed to return to ED, call 911, or call MD for any changes in condition.  Patient escorted via WC, and D/C home via private auto.  Kern Reap, RN 04/24/2014 12:10 PM  Nsg Discharge Note  Admit Date:  04/22/2014 Discharge date: 04/24/2014   Cindy Padilla to be D/C'd Home per MD order.  AVS completed.  Copy for chart, and copy for patient signed, and dated. Patient/caregiver able to verbalize understanding.  Discharge Medication:   Medication List    TAKE these medications        acetaminophen 500 MG tablet  Commonly known as:  TYLENOL  Take 500 mg by mouth 3 (three) times daily.     albuterol (2.5 MG/3ML) 0.083% nebulizer solution  Commonly known as:  PROVENTIL  Take 2.5 mg by nebulization every 2 (two) hours as needed for wheezing or shortness of breath.     albuterol 108 (90 BASE) MCG/ACT inhaler  Commonly known as:  PROVENTIL HFA;VENTOLIN HFA  Inhale 2 puffs into the lungs every 4 (four) hours as needed for wheezing or shortness of breath.     amLODipine 5 MG tablet  Commonly known as:  NORVASC  Take 5 mg by mouth daily.     antiseptic oral rinse Liqd  15 mLs by Mouth Rinse route as needed for dry mouth.     atorvastatin 40 MG tablet  Commonly known as:  LIPITOR  Take 40 mg by mouth daily.     budesonide-formoterol 160-4.5 MCG/ACT inhaler  Commonly known as:  SYMBICORT  Inhale 2 puffs into the lungs 2  (two) times daily. Rinse mouth after use     cholecalciferol 1000 UNITS tablet  Commonly known as:  VITAMIN D  Take 1,000 Units by mouth daily.     clopidogrel 75 MG tablet  Commonly known as:  PLAVIX  Take 75 mg by mouth daily.     donepezil 10 MG tablet  Commonly known as:  ARICEPT  Take 10 mg by mouth at bedtime.     fluticasone 50 MCG/ACT nasal spray  Commonly known as:  FLONASE  Place 2 sprays into both nostrils daily.     furosemide 20 MG tablet  Commonly known as:  LASIX  Take 20 mg by mouth daily with breakfast.     GERI-LANTA 200-200-20 MG/5ML suspension  Generic drug:  alum & mag hydroxide-simeth  Take 30 mLs by mouth See admin instructions. Give 1 hour after meals and at bedtime as needed for indigestion     guaiFENesin 100 MG/5ML liquid  Commonly known as:  ROBITUSSIN  Take 10 mLs (200 mg total) by mouth 3 (three) times daily as needed for cough.     hydrocortisone 2.5 % rectal cream  Commonly known as:  ANUSOL-HC  Place rectally 2 (two) times daily.     lamoTRIgine 200 MG tablet  Commonly known as:  LAMICTAL  Take 200 mg by mouth 2 (two) times daily.     loratadine 10 MG tablet  Commonly known as:  CLARITIN  Take 10 mg by mouth daily.     LORazepam 1 MG tablet  Commonly known as:  ATIVAN  Take 1 mg by mouth every 12 (twelve) hours.     magnesium oxide 400 MG tablet  Commonly known as:  MAG-OX  Take 400 mg by mouth daily.     pantoprazole 40 MG tablet  Commonly known as:  PROTONIX  Take 1 tablet (40 mg total) by mouth 2 (two) times daily.     polyethylene glycol packet  Commonly known as:  MIRALAX / GLYCOLAX  Take 17 g by mouth daily. Dissolve in 6 oz of fluid and drink     PREPARATION H EX  Apply 1 application topically 4 (four) times daily as needed (Hemorrhoidal pain.).     QUEtiapine 100 MG tablet  Commonly known as:  SEROQUEL  Take 100 mg by mouth 2 (two) times daily. Hold for sedation     ranitidine 150 MG tablet  Commonly known as:   ZANTAC  Take 150 mg by mouth 2 (two) times daily.     senna-docusate 8.6-50 MG per tablet  Commonly known as:  Senokot-S  Take 1 tablet by mouth 2 (two) times daily.     sucralfate 1 GM/10ML suspension  Commonly known as:  CARAFATE  Take 1 g by mouth 2 (two) times daily.     valsartan 320 MG tablet  Commonly known as:  DIOVAN  Take 1 tablet (320 mg total) by mouth daily.     verapamil 120 MG CR tablet  Commonly known as:  CALAN-SR  Take 120 mg by mouth daily.        Discharge Assessment: Filed Vitals:   04/24/14 1006  BP: 147/112  Pulse:   Temp:   Resp:    Skin clean, dry and intact without evidence of skin break down, no evidence of skin tears noted. IV catheter discontinued intact. Site without signs and symptoms of complications - no redness or edema noted at insertion site, patient denies c/o pain - only slight tenderness at site.  Dressing with slight pressure applied.  D/c Instructions-Education: Discharge instructions given to patient/family with verbalized understanding. D/c education completed with patient/family including follow up instructions, medication list, d/c activities limitations if indicated, with other d/c instructions as indicated  by MD - patient able to verbalize understanding, all questions fully answered. Patient instructed to return to ED, call 911, or call MD for any changes in condition.  Patient escorted via WC, and D/C home via private auto.  Kern ReapBrumagin, Deontra Pereyra L, RN 04/24/2014 12:10 PM  Nsg Discharge Note  Admit Date:  04/22/2014 Discharge date: 04/24/2014   Cindy Padilla to be D/C'd Nursing Home per MD order.  AVS completed.  Copy for chart, and copy for patient signed, and dated. Patient/caregiver able to verbalize understanding.  Discharge Medication:   Medication List    TAKE these medications        acetaminophen 500 MG tablet  Commonly known as:  TYLENOL  Take 500 mg by mouth 3 (three) times daily.     albuterol (2.5  MG/3ML) 0.083% nebulizer solution  Commonly known as:  PROVENTIL  Take 2.5 mg by nebulization every 2 (two) hours as needed for wheezing or shortness of breath.     albuterol 108 (90 BASE) MCG/ACT inhaler  Commonly known as:  PROVENTIL HFA;VENTOLIN HFA  Inhale 2 puffs into the lungs every 4 (four) hours as needed for wheezing or shortness of breath.     amLODipine 5 MG tablet  Commonly known as:  NORVASC  Take 5 mg by mouth daily.     antiseptic oral rinse Liqd  15 mLs by Mouth Rinse route as needed for dry mouth.     atorvastatin 40 MG tablet  Commonly known as:  LIPITOR  Take 40 mg by mouth daily.     budesonide-formoterol 160-4.5 MCG/ACT inhaler  Commonly known as:  SYMBICORT  Inhale 2 puffs into the lungs 2 (two) times daily. Rinse mouth after use     cholecalciferol 1000 UNITS tablet  Commonly known as:  VITAMIN D  Take 1,000 Units by mouth daily.     clopidogrel 75 MG tablet  Commonly known as:  PLAVIX  Take 75 mg by mouth daily.     donepezil 10 MG tablet  Commonly known as:  ARICEPT  Take 10 mg by mouth at bedtime.     fluticasone 50 MCG/ACT nasal spray  Commonly known as:  FLONASE  Place 2 sprays into both nostrils daily.     furosemide 20 MG tablet  Commonly known as:  LASIX  Take 20 mg by mouth daily with breakfast.     GERI-LANTA 200-200-20 MG/5ML suspension  Generic drug:  alum & mag hydroxide-simeth  Take 30 mLs by mouth See admin instructions. Give 1 hour after meals and at bedtime as needed for indigestion     guaiFENesin 100 MG/5ML liquid  Commonly known as:  ROBITUSSIN  Take 10 mLs (200 mg total) by mouth 3 (three) times daily as needed for cough.     hydrocortisone 2.5 % rectal cream  Commonly known as:  ANUSOL-HC  Place rectally 2 (two) times daily.     lamoTRIgine 200 MG tablet  Commonly known as:  LAMICTAL  Take 200 mg by mouth 2 (two) times daily.     loratadine 10 MG tablet  Commonly known as:  CLARITIN  Take 10 mg by mouth daily.      LORazepam 1 MG tablet  Commonly known as:  ATIVAN  Take 1 mg by mouth every 12 (twelve) hours.     magnesium oxide 400 MG tablet  Commonly known as:  MAG-OX  Take 400 mg by mouth daily.     pantoprazole 40 MG tablet  Commonly known as:  PROTONIX  Take 1 tablet (40 mg total) by mouth 2 (two) times daily.     polyethylene glycol packet  Commonly known as:  MIRALAX / GLYCOLAX  Take 17 g by mouth daily. Dissolve in 6 oz of fluid and drink     PREPARATION H EX  Apply 1 application topically 4 (four) times daily as needed (Hemorrhoidal pain.).     QUEtiapine 100 MG tablet  Commonly known as:  SEROQUEL  Take 100 mg by mouth 2 (two) times daily. Hold for sedation     ranitidine 150 MG tablet  Commonly known as:  ZANTAC  Take 150 mg by mouth 2 (two) times daily.     senna-docusate 8.6-50 MG per tablet  Commonly known as:  Senokot-S  Take 1 tablet by mouth 2 (two) times daily.     sucralfate 1 GM/10ML suspension  Commonly known as:  CARAFATE  Take 1 g by mouth 2 (two) times daily.     valsartan 320 MG tablet  Commonly known as:  DIOVAN  Take 1 tablet (320 mg total) by mouth daily.     verapamil 120 MG CR tablet  Commonly known as:  CALAN-SR  Take 120 mg by mouth daily.        Discharge Assessment: Filed Vitals:   04/24/14 1006  BP: 147/112  Pulse:   Temp:   Resp:    Skin clean, dry and intact without evidence of skin break down, no evidence of skin tears noted. IV catheter discontinued intact. Site without signs and symptoms of complications - no redness or edema noted at insertion site, patient denies c/o pain - only slight tenderness at site.  Dressing with slight pressure applied.  D/c Instructions-Education: Discharge instructions given to patient/family with verbalized understanding. D/c education completed with patient/family including follow up instructions, medication list, d/c activities limitations if indicated, with other d/c instructions as indicated  by MD - patient able to verbalize understanding, all questions fully answered. Patient instructed to return to ED, call 911, or call MD for any changes in condition.  Patient escorted via EMS, report called into [facility].   Kern Reap, RN 04/24/2014 12:10 PM

## 2014-05-06 ENCOUNTER — Emergency Department (HOSPITAL_COMMUNITY)
Admission: EM | Admit: 2014-05-06 | Discharge: 2014-05-06 | Disposition: A | Discharge status: Home / Self Care | Payer: Medicare Other | Attending: Emergency Medicine | Admitting: Emergency Medicine

## 2014-05-06 ENCOUNTER — Encounter (HOSPITAL_COMMUNITY): Payer: Self-pay | Admitting: Emergency Medicine

## 2014-05-06 DIAGNOSIS — K219 Gastro-esophageal reflux disease without esophagitis: Secondary | ICD-10-CM | POA: Diagnosis not present

## 2014-05-06 DIAGNOSIS — F319 Bipolar disorder, unspecified: Secondary | ICD-10-CM | POA: Insufficient documentation

## 2014-05-06 DIAGNOSIS — I10 Essential (primary) hypertension: Secondary | ICD-10-CM | POA: Diagnosis not present

## 2014-05-06 DIAGNOSIS — K625 Hemorrhage of anus and rectum: Secondary | ICD-10-CM | POA: Diagnosis present

## 2014-05-06 DIAGNOSIS — Z8709 Personal history of other diseases of the respiratory system: Secondary | ICD-10-CM | POA: Diagnosis not present

## 2014-05-06 DIAGNOSIS — K644 Residual hemorrhoidal skin tags: Secondary | ICD-10-CM | POA: Insufficient documentation

## 2014-05-06 DIAGNOSIS — M1990 Unspecified osteoarthritis, unspecified site: Secondary | ICD-10-CM | POA: Diagnosis not present

## 2014-05-06 DIAGNOSIS — E119 Type 2 diabetes mellitus without complications: Secondary | ICD-10-CM | POA: Diagnosis not present

## 2014-05-06 DIAGNOSIS — K649 Unspecified hemorrhoids: Secondary | ICD-10-CM

## 2014-05-06 DIAGNOSIS — F0390 Unspecified dementia without behavioral disturbance: Secondary | ICD-10-CM | POA: Diagnosis not present

## 2014-05-06 DIAGNOSIS — Z79899 Other long term (current) drug therapy: Secondary | ICD-10-CM | POA: Insufficient documentation

## 2014-05-06 DIAGNOSIS — Z7951 Long term (current) use of inhaled steroids: Secondary | ICD-10-CM | POA: Insufficient documentation

## 2014-05-06 DIAGNOSIS — G8929 Other chronic pain: Secondary | ICD-10-CM | POA: Diagnosis not present

## 2014-05-06 DIAGNOSIS — K5900 Constipation, unspecified: Secondary | ICD-10-CM | POA: Insufficient documentation

## 2014-05-06 DIAGNOSIS — Z87891 Personal history of nicotine dependence: Secondary | ICD-10-CM | POA: Insufficient documentation

## 2014-05-06 DIAGNOSIS — R5383 Other fatigue: Secondary | ICD-10-CM | POA: Diagnosis not present

## 2014-05-06 DIAGNOSIS — Z7902 Long term (current) use of antithrombotics/antiplatelets: Secondary | ICD-10-CM | POA: Insufficient documentation

## 2014-05-06 DIAGNOSIS — Z880 Allergy status to penicillin: Secondary | ICD-10-CM | POA: Insufficient documentation

## 2014-05-06 LAB — CBC WITH DIFFERENTIAL/PLATELET
Basophils Absolute: 0 10*3/uL (ref 0.0–0.1)
Basophils Absolute: 0 10*3/uL (ref 0.0–0.1)
Basophils Relative: 1 % (ref 0–1)
Basophils Relative: 0 % (ref 0–1)
EOS PCT: 2 % (ref 0–5)
Eosinophils Absolute: 0.1 10*3/uL (ref 0.0–0.7)
Eosinophils Absolute: 0.1 10*3/uL (ref 0.0–0.7)
Eosinophils Relative: 1 % (ref 0–5)
HCT: 33.1 % — ABNORMAL LOW (ref 36.0–46.0)
HEMATOCRIT: 31.2 % — AB (ref 36.0–46.0)
Hemoglobin: 10.5 g/dL — ABNORMAL LOW (ref 12.0–15.0)
Hemoglobin: 10.1 g/dL — ABNORMAL LOW (ref 12.0–15.0)
LYMPHS ABS: 1.6 10*3/uL (ref 0.7–4.0)
LYMPHS PCT: 30 % (ref 12–46)
Lymphocytes Relative: 34 % (ref 12–46)
Lymphs Abs: 1.8 10*3/uL (ref 0.7–4.0)
MCH: 27.4 pg (ref 26.0–34.0)
MCH: 27.7 pg (ref 26.0–34.0)
MCHC: 31.7 g/dL (ref 30.0–36.0)
MCHC: 32.4 g/dL (ref 30.0–36.0)
MCV: 86.4 fL (ref 78.0–100.0)
MCV: 85.5 fL (ref 78.0–100.0)
Monocytes Absolute: 0.6 10*3/uL (ref 0.1–1.0)
Monocytes Absolute: 0.7 10*3/uL (ref 0.1–1.0)
Monocytes Relative: 10 % (ref 3–12)
Monocytes Relative: 12 % (ref 3–12)
NEUTROS ABS: 3.2 10*3/uL (ref 1.7–7.7)
NEUTROS PCT: 58 % (ref 43–77)
Neutro Abs: 2.8 10*3/uL (ref 1.7–7.7)
Neutrophils Relative %: 52 % (ref 43–77)
PLATELETS: 239 10*3/uL (ref 150–400)
PLATELETS: 223 10*3/uL (ref 150–400)
RBC: 3.83 MIL/uL — AB (ref 3.87–5.11)
RBC: 3.65 MIL/uL — AB (ref 3.87–5.11)
RDW: 15 % (ref 11.5–15.5)
RDW: 15 % (ref 11.5–15.5)
WBC: 5.4 10*3/uL (ref 4.0–10.5)
WBC: 5.4 10*3/uL (ref 4.0–10.5)

## 2014-05-06 LAB — BASIC METABOLIC PANEL
Anion gap: 15 (ref 5–15)
Anion gap: 14 (ref 5–15)
BUN: 19 mg/dL (ref 6–23)
BUN: 18 mg/dL (ref 6–23)
CO2: 23 mEq/L (ref 19–32)
CO2: 25 meq/L (ref 19–32)
Calcium: 9.2 mg/dL (ref 8.4–10.5)
Calcium: 9.1 mg/dL (ref 8.4–10.5)
Chloride: 98 mEq/L (ref 96–112)
Chloride: 101 mEq/L (ref 96–112)
Creatinine, Ser: 0.87 mg/dL (ref 0.50–1.10)
Creatinine, Ser: 0.91 mg/dL (ref 0.50–1.10)
GFR calc Af Amer: 70 mL/min — ABNORMAL LOW (ref >90)
GFR calc non Af Amer: 64 mL/min — ABNORMAL LOW (ref >90)
GFR, EST AFRICAN AMERICAN: 74 mL/min — AB (ref >90)
GFR, EST NON AFRICAN AMERICAN: 61 mL/min — AB (ref >90)
GLUCOSE: 97 mg/dL (ref 70–99)
Glucose, Bld: 141 mg/dL — ABNORMAL HIGH (ref 70–99)
POTASSIUM: 5.5 meq/L — AB (ref 3.7–5.3)
POTASSIUM: 4.1 meq/L (ref 3.7–5.3)
SODIUM: 136 meq/L — AB (ref 137–147)
SODIUM: 140 meq/L (ref 137–147)

## 2014-05-06 NOTE — ED Provider Notes (Signed)
CSN: [id number]     Arrival date & time 05/06/14  1058 History   First MD Initiated Contact with Patient 05/06/14 1102     Chief Complaint  Patient presents with  . Rectal Bleeding     Patient is a 73 y.o. female presenting with hematochezia. The history is provided by the patient.  Rectal Bleeding Quality:  Bright red Amount:  Moderate Duration:  3 days Timing:  Intermittent Progression:  Worsening Chronicity:  Recurrent Similar prior episodes: yes   Relieved by:  None tried Worsened by:  Nothing tried Associated symptoms: abdominal pain and light-headedness   Associated symptoms: no fever, no hematemesis, no loss of consciousness and no vomiting   Risk factors comment:  Plavix Patient presents from nursing facility for rectal bleeding Pt reports she has had this previously but worse in the past 3 days She reports mild abdominal cramping She also reports feeling lightheaded    Past Medical History  Diagnosis Date  . Hypertension   . GERD (gastroesophageal reflux disease)   . Dementia   . IBS (irritable bowel syndrome)   . Arthritis   . Constipation   . Hiatal hernia   . Bipolar 1 disorder   . Diabetes mellitus   . Bronchitis   . Chronic back pain    Past Surgical History  Procedure Laterality Date  . Gallbladder    . Partial hysterectomy    . Mandible surgery     Family History  Problem Relation Age of Onset  . Emphysema Mother   . Cancer Other    History  Substance Use Topics  . Smoking status: Former Smoker -- 0.50 packs/day for 2 years    Types: Cigarettes    Quit date: 05/28/1959  . Smokeless tobacco: Former NeurosurgeonUser    Types: Snuff    Quit date: 05/28/1959     Comment: Pt used snuff as a teenager  . Alcohol Use: No   OB History    No data available     Review of Systems  Constitutional: Positive for fatigue. Negative for fever.  Cardiovascular: Negative for chest pain.  Gastrointestinal: Positive for abdominal pain, blood in stool and  hematochezia. Negative for vomiting and hematemesis.  Genitourinary: Negative for hematuria and vaginal bleeding.  Neurological: Positive for light-headedness. Negative for loss of consciousness.  All other systems reviewed and are negative.     Allergies  Aspirin; Codeine; Ivp dye; Morphine and related; Neomycin; Penicillins; Promethazine; Sulfa antibiotics; and Tetracyclines & related  Home Medications   Prior to Admission medications   Medication Sig Start Date End Date Taking? Authorizing Provider  acetaminophen (TYLENOL) 500 MG tablet Take 500 mg by mouth 3 (three) times daily.     Historical Provider, MD  albuterol (PROVENTIL HFA;VENTOLIN HFA) 108 (90 BASE) MCG/ACT inhaler Inhale 2 puffs into the lungs every 4 (four) hours as needed for wheezing or shortness of breath.    Historical Provider, MD  albuterol (PROVENTIL) (2.5 MG/3ML) 0.083% nebulizer solution Take 2.5 mg by nebulization every 2 (two) hours as needed for wheezing or shortness of breath.    Historical Provider, MD  alum & mag hydroxide-simeth (GERI-LANTA) 200-200-20 MG/5ML suspension Take 30 mLs by mouth See admin instructions. Give 1 hour after meals and at bedtime as needed for indigestion    Historical Provider, MD  amLODipine (NORVASC) 5 MG tablet Take 5 mg by mouth daily.    Historical Provider, MD  antiseptic oral rinse (BIOTENE) LIQD 15 mLs by Mouth Rinse route as  needed for dry mouth.    Historical Provider, MD  atorvastatin (LIPITOR) 40 MG tablet Take 40 mg by mouth daily.     Historical Provider, MD  budesonide-formoterol (SYMBICORT) 160-4.5 MCG/ACT inhaler Inhale 2 puffs into the lungs 2 (two) times daily. Rinse mouth after use    Historical Provider, MD  cholecalciferol (VITAMIN D) 1000 UNITS tablet Take 1,000 Units by mouth daily.     Historical Provider, MD  clopidogrel (PLAVIX) 75 MG tablet Take 75 mg by mouth daily.     Historical Provider, MD  donepezil (ARICEPT) 10 MG tablet Take 10 mg by mouth at bedtime.      Historical Provider, MD  fluticasone (FLONASE) 50 MCG/ACT nasal spray Place 2 sprays into both nostrils daily.     Historical Provider, MD  furosemide (LASIX) 20 MG tablet Take 20 mg by mouth daily with breakfast.    Historical Provider, MD  guaiFENesin (ROBITUSSIN) 100 MG/5ML liquid Take 10 mLs (200 mg total) by mouth 3 (three) times daily as needed for cough. 01/24/14   Fayrene Helper, PA-C  hydrocortisone (ANUSOL-HC) 2.5 % rectal cream Place rectally 2 (two) times daily. 04/24/14   Nishant Dhungel, MD  lamoTRIgine (LAMICTAL) 200 MG tablet Take 200 mg by mouth 2 (two) times daily.    Historical Provider, MD  loratadine (CLARITIN) 10 MG tablet Take 10 mg by mouth daily.     Historical Provider, MD  LORazepam (ATIVAN) 1 MG tablet Take 1 mg by mouth every 12 (twelve) hours.    Historical Provider, MD  magnesium oxide (MAG-OX) 400 MG tablet Take 400 mg by mouth daily.     Historical Provider, MD  pantoprazole (PROTONIX) 40 MG tablet Take 1 tablet (40 mg total) by mouth 2 (two) times daily. 04/24/14   Nishant Dhungel, MD  polyethylene glycol (MIRALAX / GLYCOLAX) packet Take 17 g by mouth daily. Dissolve in 6 oz of fluid and drink 04/24/14   Nishant Dhungel, MD  QUEtiapine (SEROQUEL) 100 MG tablet Take 100 mg by mouth 2 (two) times daily. Hold for sedation    Historical Provider, MD  ranitidine (ZANTAC) 150 MG tablet Take 150 mg by mouth 2 (two) times daily.    Historical Provider, MD  senna-docusate (SENOKOT-S) 8.6-50 MG per tablet Take 1 tablet by mouth 2 (two) times daily.    Historical Provider, MD  sucralfate (CARAFATE) 1 GM/10ML suspension Take 1 g by mouth 2 (two) times daily.    Historical Provider, MD  valsartan (DIOVAN) 320 MG tablet Take 1 tablet (320 mg total) by mouth daily. 04/16/13   Nyoka Cowden, MD  verapamil (CALAN-SR) 120 MG CR tablet Take 120 mg by mouth daily.     Historical Provider, MD  Jeanann Lewandowsky (PREPARATION H EX) Apply 1 application topically 4 (four) times daily as needed  (Hemorrhoidal pain.).    Historical Provider, MD   BP 139/60 mmHg  Pulse 71  Temp(Src) 98.4 F (36.9 C) (Oral)  Resp 14  Ht 5' (1.524 m)  Wt 178 lb (80.74 kg)  BMI 34.76 kg/m2  SpO2 98% Physical Exam CONSTITUTIONAL: Well developed/well nourished HEAD: Normocephalic/atraumatic EYES: EOMI/PERRL ENMT: Mucous membranes moist NECK: supple no meningeal signs SPINE/BACK:entire spine nontender CV: S1/S2 noted, no murmurs/rubs/gallops noted LUNGS: Lungs are clear to auscultation bilaterally, no apparent distress ABDOMEN: soft, nontender, no rebound or guarding, bowel sounds noted throughout abdomen GU:no cva tenderness RECTAL: - pt has external hemorrhoid that has evidence of bleeding.  No melena noted.  Stool color is brown.  No evidence of trauma.  Female chaperone present NEURO: Pt is awake/alert/appropriate, moves all extremitiesx4.  No facial droop.   EXTREMITIES: pulses normal/equal, full ROM SKIN: warm, color normal PSYCH: no abnormalities of mood noted, alert and oriented to situation  ED Course  Procedures   11:32 AM Pt with hemorrhoidal bleeding She is well appearing Labs pending at this time 3:46 PM No drop in HGB while in ED Pt stable in the ED Appropriate for discharge BP 119/52 mmHg  Pulse 76  Temp(Src) 98.4 F (36.9 C) (Oral)  Resp 13  Ht 5' (1.524 m)  Wt 178 lb (80.74 kg)  BMI 34.76 kg/m2  SpO2 94%  Labs Review Labs Reviewed  BASIC METABOLIC PANEL - Abnormal; Notable for the following:    Sodium 136 (*)    Potassium 5.5 (*)    Glucose, Bld 141 (*)    GFR calc non Af Amer 64 (*)    GFR calc Af Amer 74 (*)    All other components within normal limits  CBC WITH DIFFERENTIAL - Abnormal; Notable for the following:    RBC 3.83 (*)    Hemoglobin 10.5 (*)    HCT 33.1 (*)    All other components within normal limits  BASIC METABOLIC PANEL - Abnormal; Notable for the following:    GFR calc non Af Amer 61 (*)    GFR calc Af Amer 70 (*)    All other  components within normal limits  CBC WITH DIFFERENTIAL - Abnormal; Notable for the following:    RBC 3.65 (*)    Hemoglobin 10.1 (*)    HCT 31.2 (*)    All other components within normal limits  CBC WITH DIFFERENTIAL    MDM   Final diagnoses:  Rectal bleeding  Hemorrhoids, unspecified hemorrhoid type    Nursing notes including past medical history and social history reviewed and considered in documentation Labs/vital reviewed myself and considered during evaluation Previous records reviewed and considered     Joya Gaskinsonald W Ladaija Dimino, MD 05/06/14 1547

## 2014-05-06 NOTE — ED Notes (Signed)
[facility].  Staff states a "trickling of blood" and patient states "gushing" blood for greater than 1 month.  Denies pain.

## 2014-09-19 ENCOUNTER — Emergency Department (HOSPITAL_COMMUNITY): Payer: Medicare Other

## 2014-09-19 ENCOUNTER — Emergency Department (HOSPITAL_COMMUNITY)
Admission: EM | Admit: 2014-09-19 | Discharge: 2014-09-20 | Disposition: A | Discharge status: Home / Self Care | Payer: Medicare Other | Attending: Emergency Medicine | Admitting: Emergency Medicine

## 2014-09-19 ENCOUNTER — Encounter (HOSPITAL_COMMUNITY): Payer: Self-pay | Admitting: Emergency Medicine

## 2014-09-19 DIAGNOSIS — I10 Essential (primary) hypertension: Secondary | ICD-10-CM | POA: Diagnosis not present

## 2014-09-19 DIAGNOSIS — Z880 Allergy status to penicillin: Secondary | ICD-10-CM | POA: Diagnosis not present

## 2014-09-19 DIAGNOSIS — Z8709 Personal history of other diseases of the respiratory system: Secondary | ICD-10-CM | POA: Insufficient documentation

## 2014-09-19 DIAGNOSIS — K219 Gastro-esophageal reflux disease without esophagitis: Secondary | ICD-10-CM | POA: Diagnosis not present

## 2014-09-19 DIAGNOSIS — Z79899 Other long term (current) drug therapy: Secondary | ICD-10-CM | POA: Diagnosis not present

## 2014-09-19 DIAGNOSIS — E119 Type 2 diabetes mellitus without complications: Secondary | ICD-10-CM | POA: Insufficient documentation

## 2014-09-19 DIAGNOSIS — R600 Localized edema: Secondary | ICD-10-CM | POA: Insufficient documentation

## 2014-09-19 DIAGNOSIS — M1990 Unspecified osteoarthritis, unspecified site: Secondary | ICD-10-CM | POA: Diagnosis not present

## 2014-09-19 DIAGNOSIS — R1084 Generalized abdominal pain: Secondary | ICD-10-CM | POA: Diagnosis present

## 2014-09-19 DIAGNOSIS — Z7952 Long term (current) use of systemic steroids: Secondary | ICD-10-CM | POA: Diagnosis not present

## 2014-09-19 DIAGNOSIS — K439 Ventral hernia without obstruction or gangrene: Secondary | ICD-10-CM | POA: Diagnosis not present

## 2014-09-19 DIAGNOSIS — Z87891 Personal history of nicotine dependence: Secondary | ICD-10-CM | POA: Diagnosis not present

## 2014-09-19 DIAGNOSIS — G8929 Other chronic pain: Secondary | ICD-10-CM | POA: Diagnosis not present

## 2014-09-19 DIAGNOSIS — K5900 Constipation, unspecified: Secondary | ICD-10-CM | POA: Diagnosis not present

## 2014-09-19 DIAGNOSIS — F0390 Unspecified dementia without behavioral disturbance: Secondary | ICD-10-CM | POA: Diagnosis not present

## 2014-09-19 DIAGNOSIS — F319 Bipolar disorder, unspecified: Secondary | ICD-10-CM | POA: Insufficient documentation

## 2014-09-19 DIAGNOSIS — Z7951 Long term (current) use of inhaled steroids: Secondary | ICD-10-CM | POA: Diagnosis not present

## 2014-09-19 DIAGNOSIS — Z7902 Long term (current) use of antithrombotics/antiplatelets: Secondary | ICD-10-CM | POA: Insufficient documentation

## 2014-09-19 LAB — URINALYSIS, ROUTINE W REFLEX MICROSCOPIC
Bilirubin Urine: NEGATIVE
GLUCOSE, UA: NEGATIVE mg/dL
Hgb urine dipstick: NEGATIVE
KETONES UR: NEGATIVE mg/dL
LEUKOCYTES UA: NEGATIVE
Nitrite: NEGATIVE
PROTEIN: NEGATIVE mg/dL
Specific Gravity, Urine: 1.008 (ref 1.005–1.030)
Urobilinogen, UA: 0.2 mg/dL (ref 0.0–1.0)
pH: 7 (ref 5.0–8.0)

## 2014-09-19 LAB — COMPREHENSIVE METABOLIC PANEL
ALK PHOS: 94 U/L (ref 39–117)
ALT: 20 U/L (ref 0–35)
ANION GAP: 10 (ref 5–15)
AST: 27 U/L (ref 0–37)
Albumin: 4.1 g/dL (ref 3.5–5.2)
BUN: 16 mg/dL (ref 6–23)
CALCIUM: 9.1 mg/dL (ref 8.4–10.5)
CHLORIDE: 99 mmol/L (ref 96–112)
CO2: 26 mmol/L (ref 19–32)
CREATININE: 0.89 mg/dL (ref 0.50–1.10)
GFR calc Af Amer: 72 mL/min — ABNORMAL LOW (ref >90)
GFR, EST NON AFRICAN AMERICAN: 62 mL/min — AB (ref >90)
GLUCOSE: 146 mg/dL — AB (ref 70–99)
POTASSIUM: 3.8 mmol/L (ref 3.5–5.1)
Sodium: 135 mmol/L (ref 135–145)
TOTAL PROTEIN: 7.2 g/dL (ref 6.0–8.3)
Total Bilirubin: 0.4 mg/dL (ref 0.3–1.2)

## 2014-09-19 LAB — CBC WITH DIFFERENTIAL/PLATELET
BASOS ABS: 0 10*3/uL (ref 0.0–0.1)
Basophils Relative: 0 % (ref 0–1)
Eosinophils Absolute: 0.1 10*3/uL (ref 0.0–0.7)
Eosinophils Relative: 1 % (ref 0–5)
HEMATOCRIT: 35.1 % — AB (ref 36.0–46.0)
Hemoglobin: 11.4 g/dL — ABNORMAL LOW (ref 12.0–15.0)
LYMPHS PCT: 22 % (ref 12–46)
Lymphs Abs: 1.5 10*3/uL (ref 0.7–4.0)
MCH: 28.8 pg (ref 26.0–34.0)
MCHC: 32.5 g/dL (ref 30.0–36.0)
MCV: 88.6 fL (ref 78.0–100.0)
Monocytes Absolute: 0.6 10*3/uL (ref 0.1–1.0)
Monocytes Relative: 9 % (ref 3–12)
NEUTROS ABS: 4.4 10*3/uL (ref 1.7–7.7)
NEUTROS PCT: 68 % (ref 43–77)
PLATELETS: 223 10*3/uL (ref 150–400)
RBC: 3.96 MIL/uL (ref 3.87–5.11)
RDW: 14.8 % (ref 11.5–15.5)
WBC: 6.6 10*3/uL (ref 4.0–10.5)

## 2014-09-19 LAB — LIPASE, BLOOD: Lipase: 41 U/L (ref 11–59)

## 2014-09-19 MED ORDER — LAMOTRIGINE 200 MG PO TABS
200.0000 mg | ORAL_TABLET | Freq: Two times a day (BID) | ORAL | Status: DC
  Administered 2014-09-19: 200 mg via ORAL
  Filled 2014-09-19 (×3): qty 1

## 2014-09-19 MED ORDER — SUCRALFATE 1 G PO TABS
1.0000 g | ORAL_TABLET | Freq: Once | ORAL | Status: AC
  Administered 2014-09-19: 1 g via ORAL
  Filled 2014-09-19: qty 1

## 2014-09-19 MED ORDER — QUETIAPINE FUMARATE 100 MG PO TABS
100.0000 mg | ORAL_TABLET | Freq: Every day | ORAL | Status: DC
  Administered 2014-09-19: 100 mg via ORAL
  Filled 2014-09-19: qty 1

## 2014-09-19 NOTE — Discharge Instructions (Signed)
Hernia  A hernia occurs when an internal organ pushes out through a weak spot in the abdominal wall. Hernias most commonly occur in the groin and around the navel. Hernias often can be pushed back into place (reduced). Most hernias tend to get worse over time. Some abdominal hernias can get stuck in the opening (irreducible or incarcerated hernia) and cannot be reduced. An irreducible abdominal hernia which is tightly squeezed into the opening is at risk for impaired blood supply (strangulated hernia). A strangulated hernia is a medical emergency. Because of the risk for an irreducible or strangulated hernia, surgery may be recommended to repair a hernia.  CAUSES    Heavy lifting.   Prolonged coughing.   Straining to have a bowel movement.   A cut (incision) made during an abdominal surgery.  HOME CARE INSTRUCTIONS    Bed rest is not required. You may continue your normal activities.   Avoid lifting more than 10 pounds (4.5 kg) or straining.   Cough gently. If you are a smoker it is best to stop. Even the best hernia repair can break down with the continual strain of coughing. Even if you do not have your hernia repaired, a cough will continue to aggravate the problem.   Do not wear anything tight over your hernia. Do not try to keep it in with an outside bandage or truss. These can damage abdominal contents if they are trapped within the hernia sac.   Eat a normal diet.   Avoid constipation. Straining over long periods of time will increase hernia size and encourage breakdown of repairs. If you cannot do this with diet alone, stool softeners may be used.  SEEK IMMEDIATE MEDICAL CARE IF:    You have a fever.   You develop increasing abdominal pain.   You feel nauseous or vomit.   Your hernia is stuck outside the abdomen, looks discolored, feels hard, or is tender.   You have any changes in your bowel habits or in the hernia that are unusual for you.   You have increased pain or swelling around the  hernia.   You cannot push the hernia back in place by applying gentle pressure while lying down.  MAKE SURE YOU:    Understand these instructions.   Will watch your condition.   Will get help right away if you are not doing well or get worse.  Document Released: 05/13/2005 Document Revised: 08/05/2011 Document Reviewed: 12/31/2007  Exit[employer] Patient Information 2015 [employer]. This information is not intended to replace advice given to you by your health care provider. Make sure you discuss any questions you have with your health care provider.  Abdominal Pain  Many things can cause abdominal pain. Usually, abdominal pain is not caused by a disease and will improve without treatment. It can often be observed and treated at home. Your health care provider will do a physical exam and possibly order blood tests and X-rays to help determine the seriousness of your pain. However, in many cases, more time must pass before a clear cause of the pain can be found. Before that point, your health care provider may not know if you need more testing or further treatment.  HOME CARE INSTRUCTIONS   Monitor your abdominal pain for any changes. The following actions may help to alleviate any discomfort you are experiencing:   Only take over-the-counter or prescription medicines as directed by your health care provider.   Do not take laxatives unless directed to do so   by your health care provider.   Try a clear liquid diet (broth, tea, or water) as directed by your health care provider. Slowly move to a bland diet as tolerated.  SEEK MEDICAL CARE IF:   You have unexplained abdominal pain.   You have abdominal pain associated with nausea or diarrhea.   You have pain when you urinate or have a bowel movement.   You experience abdominal pain that wakes you in the night.   You have abdominal pain that is worsened or improved by eating food.   You have abdominal pain that is worsened with eating fatty foods.   You have  a fever.  SEEK IMMEDIATE MEDICAL CARE IF:    Your pain does not go away within 2 hours.   You keep throwing up (vomiting).   Your pain is felt only in portions of the abdomen, such as the right side or the left lower portion of the abdomen.   You pass bloody or black tarry stools.  MAKE SURE YOU:   Understand these instructions.    Will watch your condition.    Will get help right away if you are not doing well or get worse.   Document Released: 02/20/2005 Document Revised: 05/18/2013 Document Reviewed: 01/20/2013  [employer] Patient Information 2015 [employer]. This information is not intended to replace advice given to you by your health care provider. Make sure you discuss any questions you have with your health care provider.

## 2014-09-19 NOTE — ED Notes (Signed)
Awake. Verbally responsive. Resp even and unlabored. No audible adventitious breath sounds noted. ABC's intact. Abd soft/nondistended but tender to palpate. BS (+) and active x4 quadrants. No N/V/D reported. 

## 2014-09-19 NOTE — ED Notes (Signed)
Pt alert and oriented x4. Respirations even and unlabored, bilateral symmetrical rise and fall of chest. Skin warm and dry. In no acute distress. Denies needs.   

## 2014-09-19 NOTE — ED Provider Notes (Signed)
CSN: [id number]     Arrival date & time 09/19/14  1457 History   First MD Initiated Contact with Patient 09/19/14 1516     Chief Complaint  Patient presents with  . Abdominal Pain     (Consider location/radiation/quality/duration/timing/severity/associated sxs/prior Treatment) HPI After breakfast abdomen became firm and bloated with severe cramps. Had taken Miralax for three days about 5u days ago. Began having frequent loose stool so took several days of immodium. Today took all am meds and miralaax again b/c bowels had slowed down, shortly thereafter severe bloating and pain. No fever or vomiting. Past Medical History  Diagnosis Date  . Hypertension   . GERD (gastroesophageal reflux disease)   . Dementia   . IBS (irritable bowel syndrome)   . Arthritis   . Constipation   . Hiatal hernia   . Bipolar 1 disorder   . Diabetes mellitus   . Bronchitis   . Chronic back pain    Past Surgical History  Procedure Laterality Date  . Gallbladder    . Partial hysterectomy    . Mandible surgery     Family History  Problem Relation Age of Onset  . Emphysema Mother   . Cancer Other    History  Substance Use Topics  . Smoking status: Former Smoker -- 0.50 packs/day for 2 years    Types: Cigarettes    Quit date: 05/28/1959  . Smokeless tobacco: Former Neurosurgeon    Types: Snuff    Quit date: 05/28/1959     Comment: Pt used snuff as a teenager  . Alcohol Use: No   OB History    No data available     Review of Systems 10 Systems reviewed and are negative for acute change except as noted in the HPI.   Allergies  Aminoglycosides; Phenothiazines; Aspirin; Codeine; Ivp dye; Morphine and related; Neomycin; Penicillins; Promethazine; Sulfa antibiotics; and Tetracyclines & related  Home Medications   Prior to Admission medications   Medication Sig Start Date End Date Taking? Authorizing Provider  acetaminophen (TYLENOL) 500 MG tablet Take 500 mg by mouth 3 (three) times daily.    Yes  Historical Provider, MD  albuterol (PROVENTIL HFA;VENTOLIN HFA) 108 (90 BASE) MCG/ACT inhaler Inhale 2 puffs into the lungs every 4 (four) hours as needed for wheezing or shortness of breath.   Yes Historical Provider, MD  albuterol (PROVENTIL) (2.5 MG/3ML) 0.083% nebulizer solution Take 2.5 mg by nebulization every 2 (two) hours as needed for wheezing or shortness of breath.   Yes Historical Provider, MD  alum & mag hydroxide-simeth (GERI-LANTA) 200-200-20 MG/5ML suspension Take 30 mLs by mouth See admin instructions. Give 1 hour after meals and at bedtime as needed for indigestion   Yes Historical Provider, MD  amLODipine (NORVASC) 5 MG tablet Take 5 mg by mouth daily.   Yes Historical Provider, MD  antiseptic oral rinse (BIOTENE) LIQD 15 mLs by Mouth Rinse route at bedtime. Swish and spit   Yes Historical Provider, MD  atorvastatin (LIPITOR) 40 MG tablet Take 40 mg by mouth daily.    Yes Historical Provider, MD  budesonide-formoterol (SYMBICORT) 160-4.5 MCG/ACT inhaler Inhale 2 puffs into the lungs 2 (two) times daily. Rinse mouth after use   Yes Historical Provider, MD  cholecalciferol (VITAMIN D) 1000 UNITS tablet Take 1,000 Units by mouth daily.    Yes Historical Provider, MD  clopidogrel (PLAVIX) 75 MG tablet Take 75 mg by mouth daily.    Yes Historical Provider, MD  donepezil (ARICEPT) 10 MG tablet  Take 10 mg by mouth at bedtime.    Yes Historical Provider, MD  fexofenadine (ALLEGRA) 180 MG tablet Take 180 mg by mouth daily.   Yes Historical Provider, MD  fluticasone (FLONASE) 50 MCG/ACT nasal spray Place 2 sprays into both nostrils daily.    Yes Historical Provider, MD  furosemide (LASIX) 20 MG tablet Take 20 mg by mouth daily with breakfast.   Yes Historical Provider, MD  guaiFENesin (ROBITUSSIN) 100 MG/5ML liquid Take 10 mLs (200 mg total) by mouth 3 (three) times daily as needed for cough. 01/24/14  Yes Fayrene Helper, PA-C  hydrocortisone (ANUSOL-HC) 2.5 % rectal cream Place rectally 2 (two)  times daily. Patient taking differently: Place 1 application rectally 2 (two) times daily.  04/24/14  Yes Nishant Dhungel, MD  lamoTRIgine (LAMICTAL) 200 MG tablet Take 200 mg by mouth 2 (two) times daily.   Yes Historical Provider, MD  loperamide (IMODIUM) 2 MG capsule Take 2 mg by mouth as needed for diarrhea or loose stools. Do not exceed 8 doses in 24 hours   Yes Historical Provider, MD  LORazepam (ATIVAN) 1 MG tablet Take 1 mg by mouth every 12 (twelve) hours.   Yes Historical Provider, MD  magnesium hydroxide (MILK OF MAGNESIA) 400 MG/5ML suspension Take 30 mLs by mouth at bedtime as needed for mild constipation.   Yes Historical Provider, MD  magnesium oxide (MAG-OX) 400 MG tablet Take 400 mg by mouth daily.    Yes Historical Provider, MD  pantoprazole (PROTONIX) 40 MG tablet Take 1 tablet (40 mg total) by mouth 2 (two) times daily. 04/24/14  Yes Nishant Dhungel, MD  polyethylene glycol (MIRALAX / GLYCOLAX) packet Take 17 g by mouth daily. Dissolve in 6 oz of fluid and drink 04/24/14  Yes Nishant Dhungel, MD  QUEtiapine (SEROQUEL) 100 MG tablet Take 100 mg by mouth 2 (two) times daily. Hold for sedation   Yes Historical Provider, MD  ranitidine (ZANTAC) 150 MG tablet Take 150 mg by mouth 2 (two) times daily.   Yes Historical Provider, MD  senna-docusate (SENOKOT-S) 8.6-50 MG per tablet Take 1 tablet by mouth 2 (two) times daily.   Yes Historical Provider, MD  Skin Protectants, Misc. (MINERIN) CREA Apply 1 application topically daily. Apply to entire body   Yes Historical Provider, MD  sucralfate (CARAFATE) 1 GM/10ML suspension Take 1 g by mouth 2 (two) times daily.   Yes Historical Provider, MD  valsartan (DIOVAN) 320 MG tablet Take 1 tablet (320 mg total) by mouth daily. 04/16/13  Yes Nyoka Cowden, MD  verapamil (CALAN-SR) 120 MG CR tablet Take 120 mg by mouth daily.    Yes Historical Provider, MD  Vitamins A & D (VITAMIN A & D) ointment Apply 1 application topically 2 (two) times daily.    Yes Historical Provider, MD   BP 155/61 mmHg  Pulse 67  Temp(Src) 99.4 F (37.4 C) (Oral)  Resp 17  SpO2 99% Physical Exam  Constitutional: She is oriented to person, place, and time. She appears well-developed and well-nourished.  HENT:  Head: Normocephalic and atraumatic.  Eyes: EOM are normal. Pupils are equal, round, and reactive to light.  Neck: Neck supple.  Cardiovascular: Normal rate, regular rhythm, normal heart sounds and intact distal pulses.   Pulmonary/Chest: Effort normal and breath sounds normal.  Abdominal: She exhibits distension. There is tenderness.  Abdomen very distended, hyperactive BS, diffuse tender, no peritoneal signs.  Musculoskeletal: Normal range of motion. She exhibits edema. She exhibits no tenderness.  B/l 2+  edema, mild erythema.  Neurological: She is alert and oriented to person, place, and time. She has normal strength. Coordination normal. GCS eye subscore is 4. GCS verbal subscore is 5. GCS motor subscore is 6.  Skin: Skin is warm, dry and intact.  Psychiatric: She has a normal mood and affect.    ED Course  Procedures (including critical care time) Labs Review Labs Reviewed  CBC WITH DIFFERENTIAL/PLATELET - Abnormal; Notable for the following:    Hemoglobin 11.4 (*)    HCT 35.1 (*)    All other components within normal limits  COMPREHENSIVE METABOLIC PANEL - Abnormal; Notable for the following:    Glucose, Bld 146 (*)    GFR calc non Af Amer 62 (*)    GFR calc Af Amer 72 (*)    All other components within normal limits  LIPASE, BLOOD  URINALYSIS, ROUTINE W REFLEX MICROSCOPIC    Imaging Review Ct Abdomen Pelvis Wo Contrast  09/19/2014   CLINICAL DATA:  [facility] since Saturday and abd pain started this morning at 0930 after taking Miralax. Abd soft/very distended and tender to palpate. BS (+) and active x4 quadrants. No N/V/D reported. Pt reported having small BM today. Reported having indigestion.  ConstipationHx of IBS, GERD, and HTN  EXAM: CT ABDOMEN AND PELVIS WITHOUT CONTRAST  TECHNIQUE: Multidetector CT imaging of the abdomen and pelvis was performed following the standard protocol without IV contrast.  COMPARISON:  04/22/2014  FINDINGS: There is an anterior abdominal wall hernia containing the duodenal bulb and most of the pylorus. The herniated portion of bowel appears thick walled and there is mild adjacent inflammation. This hernia was present previously, but the apparent wall thickening and inflammation is new. This hernia may be incarcerated.  There is no evidence of bowel obstruction.  Lung bases essentially clear.  Heart is normal in size.  Multiple low-density liver lesions are noted consistent with cysts, stable.  Gallbladder surgically absent. There is chronic dilation of common bile duct, stable.  Spleen, pancreas and adrenal glands are unremarkable.  Kidneys, ureters and bladder are unremarkable.  Uterus surgically absent.  No pelvic masses.  Colon and small bowel are unremarkable. There is stool throughout the colon and in the rectum, this does not appear to be particularly increased. Normal appendix visualized.  No osteoblastic or osteolytic lesions.  IMPRESSION: 1. Right upper anterior abdominal wall hernia containing the duodenal bulb and a portion of the pylorus. Wall of the herniated bowel appears thickened and there mild adjacent inflammatory changes. This is concerning for incarceration and possibly strangulation. There is no evidence of obstruction. 2. No other evidence of an acute abnormality.   Electronically Signed   By: Amie Portlandavid  Ormond M.D.   On: 09/19/2014 20:33     EKG Interpretation None       Consult(20:50): Gen. surgery Dr. Johna SheriffHoxworth was consults it to evaluate the patient for CT findings of possible incarcerated abdominal wall hernia.  Dr. Johna SheriffHoxworth has evaluated the patient in the emergency department and finds the hernia easily reducible. At this time he advises  that she does not require any imminent surgical intervention and she can follow-up as an outpatient for elective repair. MDM   Final diagnoses:  Generalized abdominal pain  Hernia of anterior abdominal wall    The patient presents as outlined above with abdominal discomfort and history of alternating constipation and loose stool. She felt that her pain had worsened significantly this morning and was evaluated  by CT scan. There was no evidence of obstruction or other intra-abdominal pathology. A hernia of the abdominal wall was identified. Gen. surgery was consults it and as stated the patient was found to have a non-surgical hernia which can continue to be evaluated for outpatient management.   Arby Barrette, MD 09/19/14 2115

## 2014-09-19 NOTE — ED Notes (Signed)
Pt to ct scan.

## 2014-09-19 NOTE — ED Notes (Signed)
[facility], [facility] informed of transport.

## 2014-09-19 NOTE — ED Notes (Signed)
Bed: [facility] Expected date:  Expected time:  Means of arrival:  Comments: EMS/constipation 

## 2014-09-19 NOTE — ED Notes (Signed)
[facility] since Saturday and abd pain started this morning at 0930 after taking Miralax. Abd soft/very distended and tender to palpate. BS (+) and active x4 quadrants. No N/V/D reported. Pt reported having small BM today. Reported having indigestion.

## 2014-09-19 NOTE — Consult Note (Signed)
Reason for Consult:Abdominal pain, ventral hernia  Referring Physician: [name] is an 74 y.o. female.   HPI: I was asked to see this patient who was brought to the emergency department due to abdominal pain and constipation. She is not a very good historian. She has chronic constipation and has apparently been taking parallax for this for several days without much result. She states her abdomen has felt generally very gaseous and bloated and this got much worse this morning with generalized gas and pressure discomfort but also some pain which she localizes in her right upper quadrant just to the right of the epigastrium. She was brought to the emergency department for evaluation. As below her CT scan shows a ventral abdominal wall hernia in the right upper quadrant containing the distal stomach and proximal duodenum with some concern on imaging of incarceration and therefore I was asked to see her. She is in no distress. She denies any nausea or vomiting.  Past Medical History  Diagnosis Date  . Hypertension   . GERD (gastroesophageal reflux disease)   . Dementia   . IBS (irritable bowel syndrome)   . Arthritis   . Constipation   . Hiatal hernia   . Bipolar 1 disorder   . Diabetes mellitus   . Bronchitis   . Chronic back pain     Past Surgical History  Procedure Laterality Date  . Gallbladder    . Partial hysterectomy    . Mandible surgery      Family History  Problem Relation Age of Onset  . Emphysema Mother   . Cancer Other     Social History:  reports that she quit smoking about 55 years ago. Her smoking use included Cigarettes. She has a 1 pack-year smoking history. She quit smokeless tobacco use about 55 years ago. Her smokeless tobacco use included Snuff. She reports that she does not drink alcohol or use illicit drugs.  Allergies:  Allergies  Allergen Reactions  . Aminoglycosides   . Phenothiazines   . Aspirin Other (See Comments)    unknown   . Codeine Other (See Comments)    unknown  . Ivp Dye [Iodinated Diagnostic Agents] Other (See Comments)    unknown  . Morphine And Related Other (See Comments)    unknown  . Neomycin Other (See Comments)    unknown  . Penicillins Rash  . Promethazine Other (See Comments)    unknown  . Sulfa Antibiotics Other (See Comments)    unknown  . Tetracyclines & Related Other (See Comments)    unknown    Medications: I have reviewed the patient's current medications.  Results for orders placed or performed during the hospital encounter of 09/19/14 (from the past 48 hour(s))  CBC with Differential     Status: Abnormal   Collection Time: 09/19/14  3:37 PM  Result Value Ref Range   WBC 6.6 4.0 - 10.5 K/uL   RBC 3.96 3.87 - 5.11 MIL/uL   Hemoglobin 11.4 (L) 12.0 - 15.0 g/dL   HCT 35.1 (L) 36.0 - 46.0 %   MCV 88.6 78.0 - 100.0 fL   MCH 28.8 26.0 - 34.0 pg   MCHC 32.5 30.0 - 36.0 g/dL   RDW 14.8 11.5 - 15.5 %   Platelets 223 150 - 400 K/uL   Neutrophils Relative % 68 43 - 77 %   Neutro Abs 4.4 1.7 - 7.7 K/uL   Lymphocytes Relative 22 12 - 46 %   Lymphs Abs  1.5 0.7 - 4.0 K/uL   Monocytes Relative 9 3 - 12 %   Monocytes Absolute 0.6 0.1 - 1.0 K/uL   Eosinophils Relative 1 0 - 5 %   Eosinophils Absolute 0.1 0.0 - 0.7 K/uL   Basophils Relative 0 0 - 1 %   Basophils Absolute 0.0 0.0 - 0.1 K/uL  Comprehensive metabolic panel     Status: Abnormal   Collection Time: 09/19/14  3:37 PM  Result Value Ref Range   Sodium 135 135 - 145 mmol/L   Potassium 3.8 3.5 - 5.1 mmol/L   Chloride 99 96 - 112 mmol/L   CO2 26 19 - 32 mmol/L   Glucose, Bld 146 (H) 70 - 99 mg/dL   BUN 16 6 - 23 mg/dL   Creatinine, Ser 0.89 0.50 - 1.10 mg/dL   Calcium 9.1 8.4 - 10.5 mg/dL   Total Protein 7.2 6.0 - 8.3 g/dL   Albumin 4.1 3.5 - 5.2 g/dL   AST 27 0 - 37 U/L   ALT 20 0 - 35 U/L   Alkaline Phosphatase 94 39 - 117 U/L   Total Bilirubin 0.4 0.3 - 1.2 mg/dL   GFR calc non Af Amer 62 (L) >90 mL/min   GFR calc  Af Amer 72 (L) >90 mL/min    Comment: (NOTE) The eGFR has been calculated using the CKD EPI equation. This calculation has not been validated in all clinical situations. eGFR's persistently <90 mL/min signify possible Chronic Kidney Disease.    Anion gap 10 5 - 15  Lipase, blood     Status: None   Collection Time: 09/19/14  3:37 PM  Result Value Ref Range   Lipase 41 11 - 59 U/L  Urinalysis, Routine w reflex microscopic     Status: None   Collection Time: 09/19/14  4:33 PM  Result Value Ref Range   Color, Urine YELLOW YELLOW   APPearance CLEAR CLEAR   Specific Gravity, Urine 1.008 1.005 - 1.030   pH 7.0 5.0 - 8.0   Glucose, UA NEGATIVE NEGATIVE mg/dL   Hgb urine dipstick NEGATIVE NEGATIVE   Bilirubin Urine NEGATIVE NEGATIVE   Ketones, ur NEGATIVE NEGATIVE mg/dL   Protein, ur NEGATIVE NEGATIVE mg/dL   Urobilinogen, UA 0.2 0.0 - 1.0 mg/dL   Nitrite NEGATIVE NEGATIVE   Leukocytes, UA NEGATIVE NEGATIVE    Comment: MICROSCOPIC NOT DONE ON URINES WITH NEGATIVE PROTEIN, BLOOD, LEUKOCYTES, NITRITE, OR GLUCOSE <1000 mg/dL.    Ct Abdomen Pelvis Wo Contrast  09/19/2014   CLINICAL DATA:  EMS from [facility] with report of no BM since Saturday and abd pain started this morning at 0930 after taking Miralax. Abd soft/very distended and tender to palpate. BS (+) and active x4 quadrants. No N/V/D reported. Pt reported having small BM today. Reported having indigestion. ConstipationHx of IBS, GERD, and HTN  EXAM: CT ABDOMEN AND PELVIS WITHOUT CONTRAST  TECHNIQUE: Multidetector CT imaging of the abdomen and pelvis was performed following the standard protocol without IV contrast.  COMPARISON:  04/22/2014  FINDINGS: There is an anterior abdominal wall hernia containing the duodenal bulb and most of the pylorus. The herniated portion of bowel appears thick walled and there is mild adjacent inflammation. This hernia was present previously, but the apparent wall thickening and inflammation is new. This  hernia may be incarcerated.  There is no evidence of bowel obstruction.  Lung bases essentially clear.  Heart is normal in size.  Multiple low-density liver lesions are noted consistent with cysts,  stable.  Gallbladder surgically absent. There is chronic dilation of common bile duct, stable.  Spleen, pancreas and adrenal glands are unremarkable.  Kidneys, ureters and bladder are unremarkable.  Uterus surgically absent.  No pelvic masses.  Colon and small bowel are unremarkable. There is stool throughout the colon and in the rectum, this does not appear to be particularly increased. Normal appendix visualized.  No osteoblastic or osteolytic lesions.  IMPRESSION: 1. Right upper anterior abdominal wall hernia containing the duodenal bulb and a portion of the pylorus. Wall of the herniated bowel appears thickened and there mild adjacent inflammatory changes. This is concerning for incarceration and possibly strangulation. There is no evidence of obstruction. 2. No other evidence of an acute abnormality.   Electronically Signed   By: Lajean Manes M.D.   On: 09/19/2014 20:33    Review of Systems  Constitutional: Negative for fever and chills.  Respiratory: Negative for shortness of breath.   Cardiovascular: Negative for chest pain.  Gastrointestinal: Positive for abdominal pain and constipation. Negative for nausea and vomiting.   Blood pressure 155/61, pulse 67, temperature 99.4 F (37.4 C), temperature source Oral, resp. rate 17, SpO2 99 %. Physical Exam General: Alert, Obese Caucasian female conversant, in no distress Skin: Warm and dry without rash or infection. Lungs: Breath sounds clear and equal without increased work of breathing Abdomen: Obese. Possibly mildly distended.. Generally soft and nontender. There is a palpable hernia just to the right of the midline in the epigastrium near a previous port site from laparoscopic cholecystectomy. This hernia is somewhat tender but is soft and easily  reducible.  Assessment/Plan: Abdominal pain, ventral incisional hernia. She has chronic constipation and has been taking Mira lax and generally complains of diffuse gas discomfort. Her CT scan Which I personally reviewed shows a very discrete ventral hernia containing distal stomach and duodenum likely from a previous laparoscopic port site. There is some edema that raised the question of incarceration or strangulation. However there is no obstruction with contrast having passed into normal small bowel. She has no nausea or vomiting to suggest incarceration or obstruction. On exam her hernia is slightly tender but obviously reducible. I suspect that her generalized bloating and gas from constipation caused some increased pressure on the hernia and discomfort but I do not see any evidence of incarceration or emergency surgical problem. She does have multiple medical problems but likely would benefit from a follow-up in our office to discuss pros and cons of elective hernia repair. This was discussed with Dr. Vallery Ridge.  Cindy Padilla T 09/19/2014, 9:08 PM

## 2014-09-20 DIAGNOSIS — K439 Ventral hernia without obstruction or gangrene: Secondary | ICD-10-CM | POA: Diagnosis not present

## 2014-09-20 MED ORDER — LORAZEPAM 1 MG PO TABS
1.0000 mg | ORAL_TABLET | Freq: Once | ORAL | Status: AC
  Administered 2014-09-20: 1 mg via ORAL
  Filled 2014-09-20: qty 1

## 2014-09-20 NOTE — ED Notes (Signed)
Pt alert x4 transported via ptar

## 2014-12-12 IMAGING — CR DG CHEST 2V
3 series · 3 of 3 positions shown · non-contrast
Comparison: 01/08/2013 and earlier.

CLINICAL DATA: 72-year-old female shortness of breath and anxiety.

EXAM:
CHEST  2 VIEW

[x chest ap (1 of 2)]
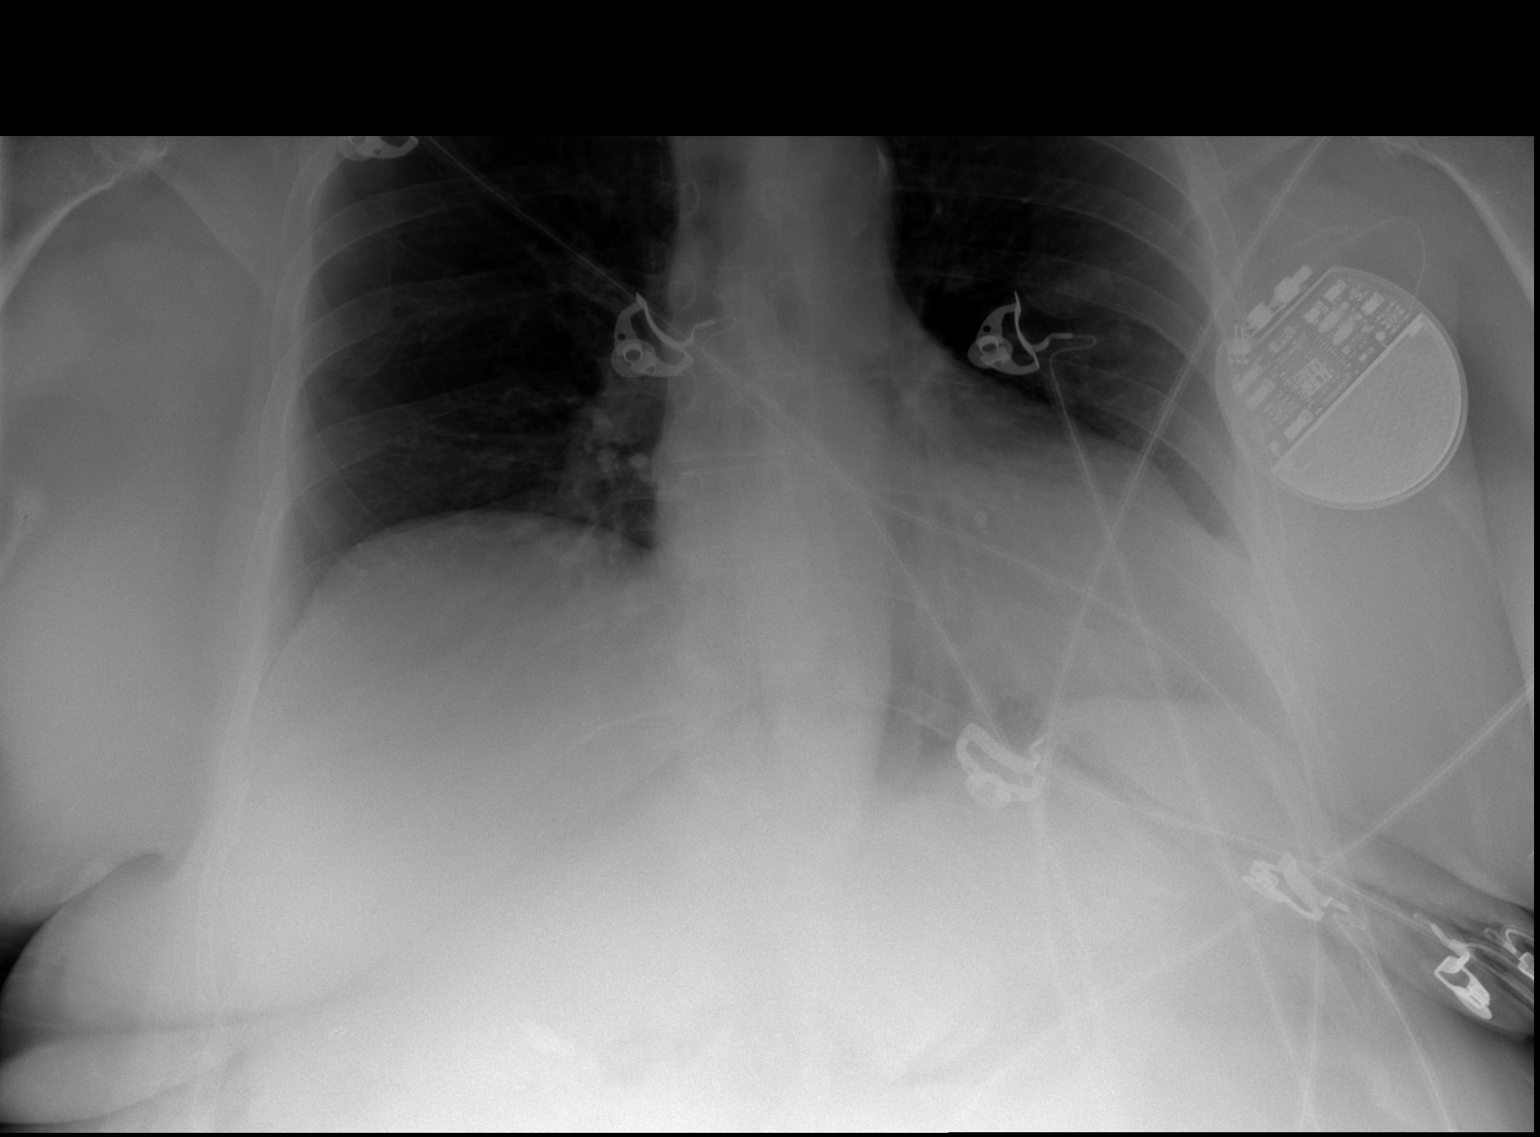

[x chest ap (2 of 2)]
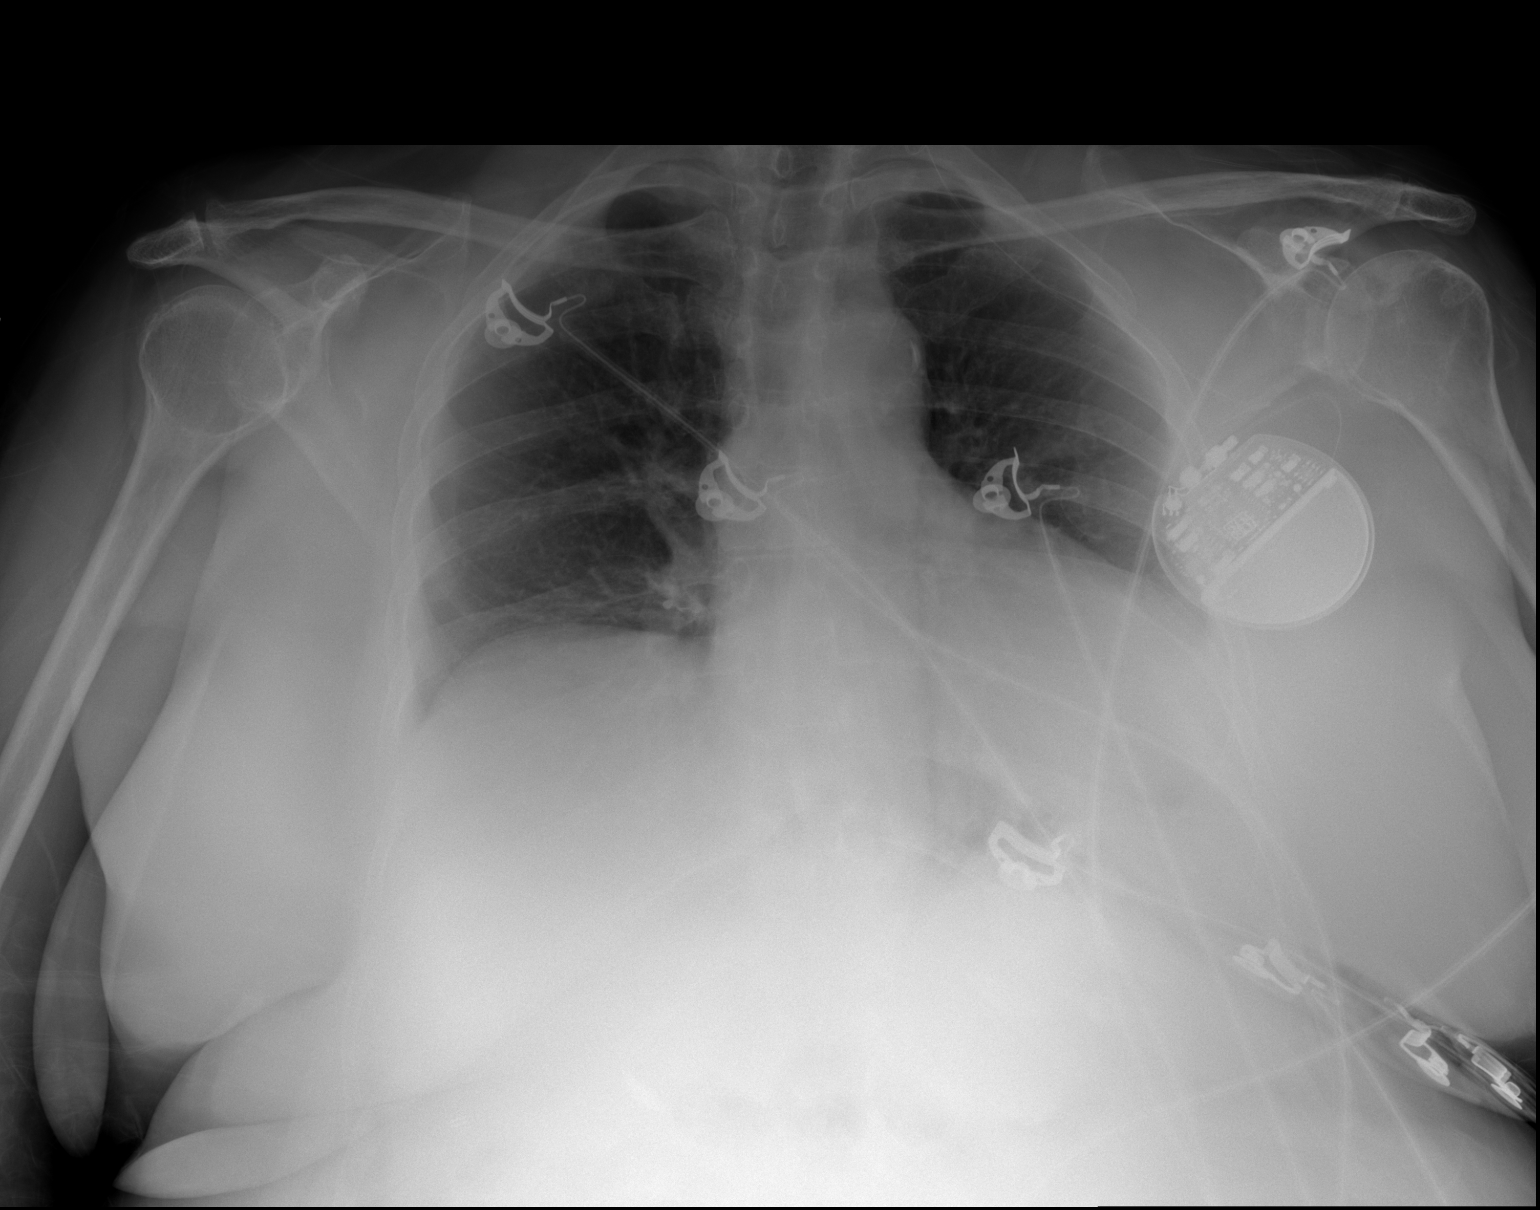

[w chest lat]
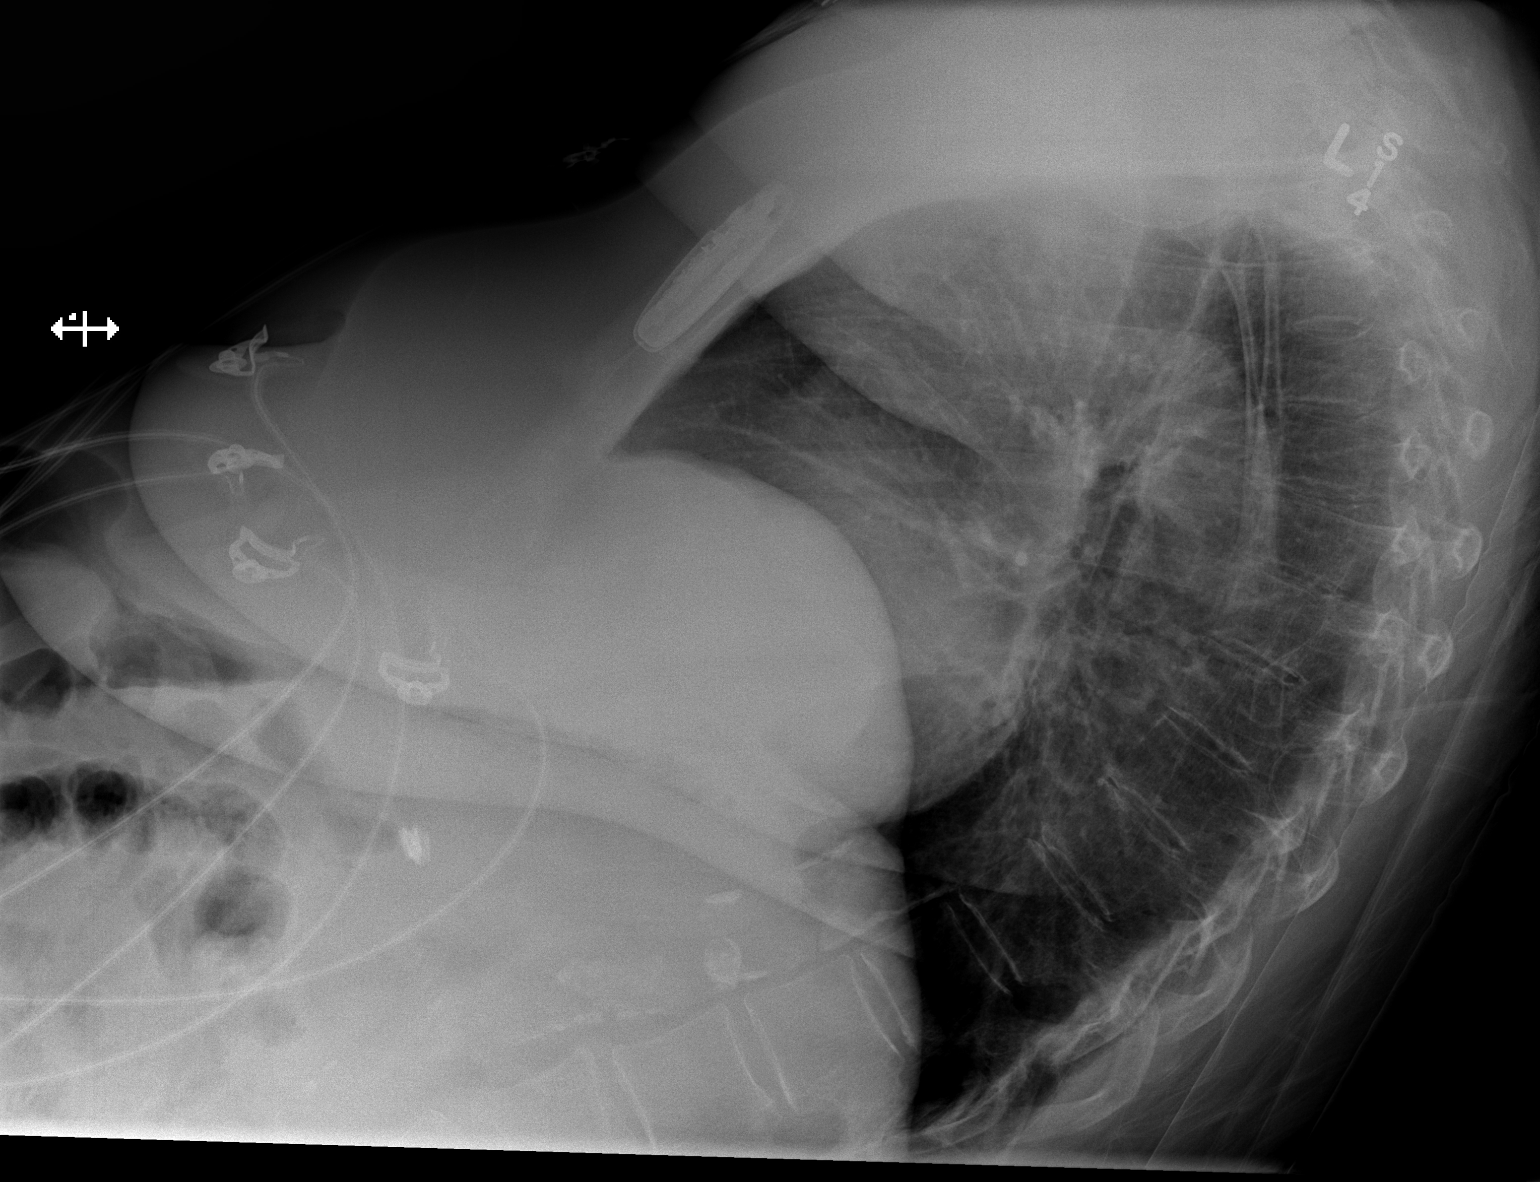

[3 of 3 positions shown; findings below may reference images not displayed]

FINDINGS: Semi upright AP and lateral views of the chest. Mildly lower lung
volumes. Similar elevation of the right hemidiaphragm. The lungs are
clear. No pneumothorax. Stable cardiomegaly and mediastinal
contours. Visualized tracheal air column is within normal limits.
Left cervical spine stimulator type device re- identified. Calcified
atherosclerosis of the aorta. No acute osseous abnormality
identified. Right upper quadrant surgical clips.
IMPRESSION: Lower lung volumes, otherwise no acute cardiopulmonary abnormality.

## 2015-04-07 ENCOUNTER — Emergency Department (HOSPITAL_COMMUNITY)
Admission: EM | Admit: 2015-04-07 | Discharge: 2015-04-08 | Disposition: A | Discharge status: Home / Self Care | Payer: Medicare Other | Attending: Emergency Medicine | Admitting: Emergency Medicine

## 2015-04-07 ENCOUNTER — Encounter (HOSPITAL_COMMUNITY): Payer: Self-pay | Admitting: Emergency Medicine

## 2015-04-07 DIAGNOSIS — Z79899 Other long term (current) drug therapy: Secondary | ICD-10-CM | POA: Insufficient documentation

## 2015-04-07 DIAGNOSIS — F0390 Unspecified dementia without behavioral disturbance: Secondary | ICD-10-CM | POA: Diagnosis not present

## 2015-04-07 DIAGNOSIS — Z880 Allergy status to penicillin: Secondary | ICD-10-CM | POA: Insufficient documentation

## 2015-04-07 DIAGNOSIS — J069 Acute upper respiratory infection, unspecified: Secondary | ICD-10-CM | POA: Diagnosis not present

## 2015-04-07 DIAGNOSIS — K439 Ventral hernia without obstruction or gangrene: Secondary | ICD-10-CM | POA: Insufficient documentation

## 2015-04-07 DIAGNOSIS — F319 Bipolar disorder, unspecified: Secondary | ICD-10-CM | POA: Insufficient documentation

## 2015-04-07 DIAGNOSIS — J9801 Acute bronchospasm: Secondary | ICD-10-CM | POA: Diagnosis not present

## 2015-04-07 DIAGNOSIS — Z7902 Long term (current) use of antithrombotics/antiplatelets: Secondary | ICD-10-CM | POA: Diagnosis not present

## 2015-04-07 DIAGNOSIS — G8929 Other chronic pain: Secondary | ICD-10-CM | POA: Insufficient documentation

## 2015-04-07 DIAGNOSIS — E119 Type 2 diabetes mellitus without complications: Secondary | ICD-10-CM | POA: Diagnosis not present

## 2015-04-07 DIAGNOSIS — R0602 Shortness of breath: Secondary | ICD-10-CM | POA: Diagnosis present

## 2015-04-07 DIAGNOSIS — Z87891 Personal history of nicotine dependence: Secondary | ICD-10-CM | POA: Insufficient documentation

## 2015-04-07 DIAGNOSIS — Z7951 Long term (current) use of inhaled steroids: Secondary | ICD-10-CM | POA: Diagnosis not present

## 2015-04-07 DIAGNOSIS — M1990 Unspecified osteoarthritis, unspecified site: Secondary | ICD-10-CM | POA: Diagnosis not present

## 2015-04-07 DIAGNOSIS — I10 Essential (primary) hypertension: Secondary | ICD-10-CM | POA: Insufficient documentation

## 2015-04-07 DIAGNOSIS — K219 Gastro-esophageal reflux disease without esophagitis: Secondary | ICD-10-CM | POA: Insufficient documentation

## 2015-04-07 DIAGNOSIS — Z8701 Personal history of pneumonia (recurrent): Secondary | ICD-10-CM | POA: Insufficient documentation

## 2015-04-07 NOTE — ED Provider Notes (Signed)
CSN: [id number]     Arrival date & time 04/07/15  2322 History  By signing my name below, I, Doreatha Martinva Mathews, attest that this documentation has been prepared under the direction and in the presence of Elpidio AnisShari Mikkel Charrette, PA-C. Electronically Signed: Doreatha MartinEva Mathews, ED Scribe. 04/08/2015. 12:05 AM.    No chief complaint on file.  The history is provided by the patient. No language interpreter was used.    HPI Comments: Cindy Padilla is a 74 y.o. female with h/o HTN, GERD, dementia, IBS, DM, bronchitis who presents to the Emergency Department complaining of moderate, unchanged chest congestion onset a month ago with associated wheezing, SOB, cough, postnasal drip. Pt states that she took 2 rounds of Levaquin 500mg  qd, prednisone and mucinex in the last month for bronchitis. Pt states that she felt relief temporarily with this treatment. Pt reports that her current symptoms are the same as her bronchitis. She has finished her course of antibiotics. Pt has been taking albuterol with mild relief (last dose just PTA). Pt has also tried cough drops with mild relief. She states that she has been referred to a pulmonologist, but has not been seen in an office. No h/o asthma, COPD. H/o pneumonia. PCP is Misty StanleyLisa with Dr. Cathey EndowBowen. Pt lives in an assisted living facility.  Past Medical History  Diagnosis Date  . Hypertension   . GERD (gastroesophageal reflux disease)   . Dementia   . IBS (irritable bowel syndrome)   . Arthritis   . Constipation   . Hiatal hernia   . Bipolar 1 disorder (HCC)   . Diabetes mellitus   . Bronchitis   . Chronic back pain    Past Surgical History  Procedure Laterality Date  . Gallbladder    . Partial hysterectomy    . Mandible surgery     Family History  Problem Relation Age of Onset  . Emphysema Mother   . Cancer Other    Social History  Substance Use Topics  . Smoking status: Former Smoker -- 0.50 packs/day for 2 years    Types: Cigarettes    Quit date: 05/28/1959  .  Smokeless tobacco: Former NeurosurgeonUser    Types: Snuff    Quit date: 05/28/1959     Comment: Pt used snuff as a teenager  . Alcohol Use: No   OB History    No data available     Review of Systems  HENT: Positive for congestion and postnasal drip.   Respiratory: Positive for cough, shortness of breath and wheezing.    Allergies  Aminoglycosides; Phenothiazines; Aspirin; Codeine; Ivp dye; Morphine and related; Neomycin; Penicillins; Promethazine; Sulfa antibiotics; and Tetracyclines & related  Home Medications   Prior to Admission medications   Medication Sig Start Date End Date Taking? Authorizing Provider  acetaminophen (TYLENOL) 325 MG tablet Take 650 mg by mouth 3 (three) times daily.   Yes Historical Provider, MD  albuterol (PROVENTIL HFA;VENTOLIN HFA) 108 (90 BASE) MCG/ACT inhaler Inhale 2 puffs into the lungs every 4 (four) hours as needed for wheezing or shortness of breath.   Yes Historical Provider, MD  albuterol (PROVENTIL) (2.5 MG/3ML) 0.083% nebulizer solution Take 2.5 mg by nebulization 2 (two) times daily.    Yes Historical Provider, MD  alum & mag hydroxide-simeth (GERI-LANTA) 200-200-20 MG/5ML suspension Take 30 mLs by mouth See admin instructions. Give 1 hour after meals and at bedtime as needed for indigestion   Yes Historical Provider, MD  amLODipine (NORVASC) 5 MG tablet Take 5 mg by  mouth daily.   Yes Historical Provider, MD  antiseptic oral rinse (BIOTENE) LIQD 15 mLs by Mouth Rinse route at bedtime. Swish and spit   Yes Historical Provider, MD  atorvastatin (LIPITOR) 40 MG tablet Take 40 mg by mouth daily.    Yes Historical Provider, MD  budesonide-formoterol (SYMBICORT) 160-4.5 MCG/ACT inhaler Inhale 2 puffs into the lungs 2 (two) times daily. Rinse mouth after use   Yes Historical Provider, MD  cholecalciferol (VITAMIN D) 1000 UNITS tablet Take 1,000 Units by mouth daily.    Yes Historical Provider, MD  clopidogrel (PLAVIX) 75 MG tablet Take 75 mg by mouth daily.    Yes  Historical Provider, MD  donepezil (ARICEPT) 10 MG tablet Take 10 mg by mouth at bedtime.    Yes Historical Provider, MD  esomeprazole (NEXIUM) 40 MG capsule Take 40 mg by mouth daily at 12 noon.   Yes Historical Provider, MD  ferrous sulfate 325 (65 FE) MG tablet Take 325 mg by mouth daily with breakfast.   Yes Historical Provider, MD  fexofenadine (ALLEGRA) 180 MG tablet Take 180 mg by mouth daily.   Yes Historical Provider, MD  fluticasone (FLONASE) 50 MCG/ACT nasal spray Place 2 sprays into both nostrils daily.    Yes Historical Provider, MD  furosemide (LASIX) 20 MG tablet Take 20 mg by mouth daily with breakfast.   Yes Historical Provider, MD  guaiFENesin (ROBITUSSIN) 100 MG/5ML liquid Take 10 mLs (200 mg total) by mouth 3 (three) times daily as needed for cough. 01/24/14  Yes Fayrene Helper, PA-C  hydrocortisone (ANUSOL-HC) 2.5 % rectal cream Place rectally 2 (two) times daily. Patient taking differently: Place 1 application rectally 4 (four) times daily as needed for hemorrhoids.  04/24/14  Yes Nishant Dhungel, MD  lamoTRIgine (LAMICTAL) 200 MG tablet Take 200 mg by mouth 2 (two) times daily.   Yes Historical Provider, MD  loperamide (IMODIUM) 2 MG capsule Take 2 mg by mouth as needed for diarrhea or loose stools. Do not exceed 8 doses in 24 hours   Yes Historical Provider, MD  LORazepam (ATIVAN) 1 MG tablet Take 1 mg by mouth every 12 (twelve) hours.   Yes Historical Provider, MD  magnesium hydroxide (MILK OF MAGNESIA) 400 MG/5ML suspension Take 30 mLs by mouth at bedtime as needed for mild constipation.   Yes Historical Provider, MD  magnesium oxide (MAG-OX) 400 MG tablet Take 400 mg by mouth daily.    Yes Historical Provider, MD  QUEtiapine (SEROQUEL) 100 MG tablet Take 100 mg by mouth 2 (two) times daily. Hold for sedation   Yes Historical Provider, MD  ranitidine (ZANTAC) 150 MG tablet Take 150 mg by mouth 2 (two) times daily.   Yes Historical Provider, MD  senna-docusate (SENOKOT-S) 8.6-50  MG per tablet Take 1 tablet by mouth 2 (two) times daily.   Yes Historical Provider, MD  valsartan (DIOVAN) 320 MG tablet Take 1 tablet (320 mg total) by mouth daily. 04/16/13  Yes Nyoka Cowden, MD  verapamil (CALAN-SR) 120 MG CR tablet Take 120 mg by mouth daily.    Yes Historical Provider, MD  Vitamins A & D (VITAMIN A & D) ointment Apply 1 application topically every 6 (six) hours as needed (rectal pain).    Yes Historical Provider, MD  pantoprazole (PROTONIX) 40 MG tablet Take 1 tablet (40 mg total) by mouth 2 (two) times daily. Patient not taking: Reported on 04/07/2015 04/24/14   Theda Belfast Dhungel, MD  polyethylene glycol (MIRALAX / GLYCOLAX) packet Take 17  g by mouth daily. Dissolve in 6 oz of fluid and drink Patient not taking: Reported on 04/07/2015 04/24/14   Nishant Dhungel, MD   BP 147/66 mmHg  Pulse 91  Temp(Src) 98.7 F (37.1 C) (Oral)  Resp 20  SpO2 95% Physical Exam  Constitutional: She is oriented to person, place, and time. She appears well-developed and well-nourished.  Well appearing, alert and awake.   HENT:  Head: Normocephalic and atraumatic.  Eyes: Conjunctivae and EOM are normal. Pupils are equal, round, and reactive to light.  Neck: Normal range of motion. Neck supple.  Cardiovascular: Normal rate.   No murmur heard. No murmurs appreciated.   Pulmonary/Chest: Effort normal. No respiratory distress. She has wheezes. She has rhonchi.  Diffuse expiratory wheezes, scattered rhonchi. Full air movement.   Abdominal: Soft. She exhibits no distension.  She has a ventral hernia that is soft.   Musculoskeletal: Normal range of motion.  Neurological: She is alert and oriented to person, place, and time.  Skin: Skin is warm and dry.  Psychiatric: She has a normal mood and affect. Her behavior is normal.  Nursing note and vitals reviewed.  ED Course  Procedures (including critical care time) DIAGNOSTIC STUDIES: Oxygen Saturation is 95% on RA, adequate by my  interpretation.    COORDINATION OF CARE: 12:04 AM Discussed treatment plan with pt at bedside and pt agreed to plan.   Labs Review Labs Reviewed - No data to display  Imaging Review No results found. I have personally reviewed and evaluated these images and lab results as part of my medical decision-making.  MDM   Final diagnoses:  None    1. URI  CXR without pneumonia. Wheezing resolved with nebulizer x 1. She appears to feel well and is in NAD. VSS. No hypoxia. She can be discharged home with steroids, atrovent for nebulizer and referral back to pulmonology (has seen Wert in the past).    I personally performed the services described in this documentation, which was scribed in my presence. The recorded information has been reviewed and is accurate.     Elpidio Anis, PA-C 04/08/15 0415  Samuel Jester, DO 04/10/15 1540

## 2015-04-07 NOTE — ED Notes (Signed)
Bed: WA17 Expected date:  Expected time:  Means of arrival:  Comments: EMS 52F Diff breathing SNF

## 2015-04-08 ENCOUNTER — Emergency Department (HOSPITAL_COMMUNITY): Payer: Medicare Other

## 2015-04-08 DIAGNOSIS — J069 Acute upper respiratory infection, unspecified: Secondary | ICD-10-CM | POA: Diagnosis not present

## 2015-04-08 LAB — CBC WITH DIFFERENTIAL/PLATELET
BASOS ABS: 0 10*3/uL (ref 0.0–0.1)
BASOS PCT: 0 %
Eosinophils Absolute: 0 10*3/uL (ref 0.0–0.7)
Eosinophils Relative: 0 %
HEMATOCRIT: 35.6 % — AB (ref 36.0–46.0)
HEMOGLOBIN: 12.1 g/dL (ref 12.0–15.0)
LYMPHS PCT: 9 %
Lymphs Abs: 0.6 10*3/uL — ABNORMAL LOW (ref 0.7–4.0)
MCH: 30.6 pg (ref 26.0–34.0)
MCHC: 34 g/dL (ref 30.0–36.0)
MCV: 89.9 fL (ref 78.0–100.0)
MONO ABS: 0.5 10*3/uL (ref 0.1–1.0)
MONOS PCT: 8 %
NEUTROS ABS: 5.7 10*3/uL (ref 1.7–7.7)
NEUTROS PCT: 83 %
Platelets: 232 10*3/uL (ref 150–400)
RBC: 3.96 MIL/uL (ref 3.87–5.11)
RDW: 14.3 % (ref 11.5–15.5)
WBC: 6.9 10*3/uL (ref 4.0–10.5)

## 2015-04-08 LAB — COMPREHENSIVE METABOLIC PANEL
ALBUMIN: 4.1 g/dL (ref 3.5–5.0)
ALK PHOS: 89 U/L (ref 38–126)
ALT: 25 U/L (ref 14–54)
AST: 32 U/L (ref 15–41)
Anion gap: 12 (ref 5–15)
BILIRUBIN TOTAL: 0.8 mg/dL (ref 0.3–1.2)
BUN: 13 mg/dL (ref 6–20)
CALCIUM: 8.9 mg/dL (ref 8.9–10.3)
CO2: 23 mmol/L (ref 22–32)
CREATININE: 0.89 mg/dL (ref 0.44–1.00)
Chloride: 98 mmol/L — ABNORMAL LOW (ref 101–111)
GFR calc Af Amer: >60 mL/min (ref >60)
GLUCOSE: 130 mg/dL — AB (ref 65–99)
POTASSIUM: 4.3 mmol/L (ref 3.5–5.1)
Sodium: 133 mmol/L — ABNORMAL LOW (ref 135–145)
TOTAL PROTEIN: 7.2 g/dL (ref 6.5–8.1)

## 2015-04-08 MED ORDER — ONDANSETRON 8 MG PO TBDP
8.0000 mg | ORAL_TABLET | Freq: Once | ORAL | Status: AC
  Administered 2015-04-08: 8 mg via ORAL
  Filled 2015-04-08: qty 1

## 2015-04-08 MED ORDER — PREDNISONE 20 MG PO TABS
60.0000 mg | ORAL_TABLET | Freq: Once | ORAL | Status: AC
  Administered 2015-04-08: 60 mg via ORAL
  Filled 2015-04-08: qty 3

## 2015-04-08 MED ORDER — IPRATROPIUM BROMIDE 0.02 % IN SOLN: 0.5000 mg | Freq: Four times a day (QID) | RESPIRATORY_TRACT | Status: DC | PRN

## 2015-04-08 MED ORDER — IPRATROPIUM BROMIDE 0.02 % IN SOLN
0.5000 mg | Freq: Once | RESPIRATORY_TRACT | Status: AC
  Administered 2015-04-08: 0.5 mg via RESPIRATORY_TRACT
  Filled 2015-04-08: qty 2.5

## 2015-04-08 MED ORDER — ALBUTEROL SULFATE (2.5 MG/3ML) 0.083% IN NEBU
5.0000 mg | INHALATION_SOLUTION | Freq: Once | RESPIRATORY_TRACT | Status: AC
  Administered 2015-04-08: 5 mg via RESPIRATORY_TRACT
  Filled 2015-04-08: qty 6

## 2015-04-08 MED ORDER — PREDNISONE 20 MG PO TABS: 40.0000 mg | ORAL_TABLET | Freq: Every day | ORAL | Status: DC

## 2015-04-08 NOTE — ED Notes (Signed)
Pt had episode of emesis. Zofran was given PO

## 2015-04-08 NOTE — ED Notes (Signed)
[facility]

## 2015-04-08 NOTE — Discharge Instructions (Signed)
Bronchospasm, Adult  A bronchospasm is a spasm or tightening of the airways going into the lungs. During a bronchospasm breathing becomes more difficult because the airways get smaller. When this happens there can be coughing, a whistling sound when breathing (wheezing), and difficulty breathing. Bronchospasm is often associated with asthma, but not all patients who experience a bronchospasm have asthma.  CAUSES   A bronchospasm is caused by inflammation or irritation of the airways. The inflammation or irritation may be triggered by:   · Allergies (such as to animals, pollen, food, or mold). Allergens that cause bronchospasm may cause wheezing immediately after exposure or many hours later.    · Infection. Viral infections are believed to be the most common cause of bronchospasm.    · Exercise.    · Irritants (such as pollution, cigarette smoke, strong odors, aerosol sprays, and paint fumes).    · Weather changes. Winds increase molds and pollens in the air. Rain refreshes the air by washing irritants out. Cold air may cause inflammation.    · Stress and emotional upset.    SIGNS AND SYMPTOMS   · Wheezing.    · Excessive nighttime coughing.    · Frequent or severe coughing with a simple cold.    · Chest tightness.    · Shortness of breath.    DIAGNOSIS   Bronchospasm is usually diagnosed through a history and physical exam. Tests, such as chest X-rays, are sometimes done to look for other conditions.  TREATMENT   · Inhaled medicines can be given to open up your airways and help you breathe. The medicines can be given using either an inhaler or a nebulizer machine.  · Corticosteroid medicines may be given for severe bronchospasm, usually when it is associated with asthma.  HOME CARE INSTRUCTIONS   · Always have a plan prepared for seeking medical care. Know when to call your health care provider and local emergency services (911 in the [place] Know where you can access local emergency care.  · Only take medicines as  directed by your health care provider.  · If you were prescribed an inhaler or nebulizer machine, ask your health care provider to explain how to use it correctly. Always use a spacer with your inhaler if you were given one.  · It is necessary to remain calm during an attack. Try to relax and breathe more slowly.   · Control your home environment in the following ways:      Change your heating and air conditioning filter at least once a month.      Limit your use of fireplaces and wood stoves.    Do not smoke and do not allow smoking in your home.      Avoid exposure to perfumes and fragrances.      Get rid of pests (such as roaches and mice) and their droppings.      Throw away plants if you see mold on them.      Keep your house clean and dust free.      Replace carpet with wood, tile, or vinyl flooring. Carpet can trap dander and dust.      Use allergy-proof pillows, mattress covers, and box spring covers.      Wash bed sheets and blankets every week in hot water and dry them in a dryer.      Use blankets that are made of polyester or cotton.      Wash hands frequently.  SEEK MEDICAL CARE IF:   · You have muscle aches.    · You have chest pain.    · The sputum changes from clear or   white to yellow, green, gray, or bloody.    · The sputum you cough up gets thicker.    · There are problems that may be related to the medicine you are given, such as a rash, itching, swelling, or trouble breathing.    SEEK IMMEDIATE MEDICAL CARE IF:   · You have worsening wheezing and coughing even after taking your prescribed medicines.    · You have increased difficulty breathing.    · You develop severe chest pain.  MAKE SURE YOU:   · Understand these instructions.  · Will watch your condition.  · Will get help right away if you are not doing well or get worse.     This information is not intended to replace advice given to you by your health care provider. Make sure you discuss any questions you have with your health care  provider.     Document Released: 05/16/2003 Document Revised: 06/03/2014 Document Reviewed: 11/02/2012  [employer] Interactive Patient Education ©2016 [employer]

## 2015-04-11 ENCOUNTER — Ambulatory Visit (INDEPENDENT_AMBULATORY_CARE_PROVIDER_SITE_OTHER): Payer: Medicare Other | Admitting: Internal Medicine

## 2015-04-11 ENCOUNTER — Encounter: Payer: Self-pay | Admitting: Internal Medicine

## 2015-04-11 VITALS — BP 140/80 | HR 65 | Ht 60.0 in | Wt 184.0 lb

## 2015-04-11 DIAGNOSIS — R05 Cough: Secondary | ICD-10-CM | POA: Diagnosis not present

## 2015-04-11 DIAGNOSIS — R0602 Shortness of breath: Secondary | ICD-10-CM

## 2015-04-11 DIAGNOSIS — R058 Other specified cough: Secondary | ICD-10-CM

## 2015-04-11 DIAGNOSIS — J45909 Unspecified asthma, uncomplicated: Secondary | ICD-10-CM

## 2015-04-11 NOTE — Patient Instructions (Addendum)
Nexium 40 mg   Should be   30-60 min before first meal of the day and Zantac 150  mg one @  bedtime until return to office - this is the best way to tell whether stomach acid is contributing to your problem.    For drainage / throat tickle try take CHLORPHENIRAMINE  4 mg - take one every 4 hours as needed - available over the counter- may cause drowsiness so start with just a bedtime dose or two and see how you tolerate it before trying in daytime    GERD (REFLUX)  is an extremely common cause of respiratory symptoms just like yours , many times with no obvious heartburn at all.    It can be treated with medication, but also with lifestyle changes including elevation of the head of your bed (ideally with 6 inch  bed blocks),  Smoking cessation, avoidance of late meals, excessive alcohol, and avoid fatty foods, chocolate, peppermint, colas, red wine, and acidic juices such as orange juice.  NO MINT OR MENTHOL PRODUCTS SO NO COUGH DROPS  USE SUGARLESS CANDY INSTEAD (Jolley ranchers or Stover's or Life Savers) or even ice chips will also do - the key is to swallow to prevent all throat clearing. NO OIL BASED VITAMINS - use powdered substitutes.    Pulmonary follow up is as needed

## 2015-04-11 NOTE — Progress Notes (Signed)
Subjective:    Patient ID: Cindy Padilla, female    DOB: Oct 05, 1940   MRN: [id number]    Brief patient profile:  74 yowf smoked only as teenager but carries dx asthma/ copd referred 04/16/2013 to pulmonary clinic for eval of sob by EDP but proved to have nl f/v loop 04/16/13   History of Present Illness  04/16/2013 1st [facility] office visit/ [name] cc daily symbicort and acei and prn duoneb for at least a few years with doe x walking the halls at [facility]. Constant sense of  nose drainage and mostly dry cough. Much more sob with exp to cleaning solutions  rec Stop lisinopril Start Diovan 320 mg one daily in its place Continue the symbicort 2 every 12 hours for now  If breathing problems continue stop coreg and use bisoprolol 5 mg in its place  Date of Admission: 07/24/2013 3:26 PM  Date of Discharge: 07/27/2013  Attending Physician: Farley Ly, MD  Discharge Diagnosis:  Active Problems:  HTN (hypertension) > stopped coreg/ replaced with bisoprolol  Acute respiratory failure  COPD with acute exacerbation  Diastolic heart failure  Dementia with behavioral disturbance    08/05/2013 post hosp f/u/[name] re: ? asthma Chief Complaint  Patient presents with  . HFU    Pt states that her wheezing has resolved. Her breathing is improving, but not quite back at baseline.   Was actually doing better p last ov until acutely deteriorating > admit as above.  Back near baseline  off coreg with minimal need for saba despite poor hfa technique/symbicort use rec No change in your medications - be sure you don't take corevidol (coreg) which interferes with your symbicort   04/11/2015  f/u ov/[name] re: pseudoasthma / asthma maint on symb 160 2bid s need for saba  Chief Complaint  Patient presents with  . Acute Visit    Pt c/o increased cough and SOB "for a long time, I don't know".  Cough is non prod. She also c/o PND.     Nasal congestion with sense of pnds main issue now worse at hs  but no excess mucus with cough more day than noct  Says sob across the room gradually worse over last year.  No obvious day to day or daytime variabilty or assoc   cp or chest tightness, subjective wheeze overt sinus or hb symptoms. No unusual exp hx or h/o childhood pna/ asthma or knowledge of premature birth.  Sleeping ok without nocturnal  or early am exacerbation  of respiratory  c/o's or need for noct saba. Also denies any obvious fluctuation of symptoms with weather or environmental changes or other aggravating or alleviating factors except as outlined above   Current Medications, Allergies, Complete Past Medical History, Past Surgical History, Family History, and Social History were reviewed in [employer] record.  ROS  The following are not active complaints unless bolded sore throat, dysphagia, dental problems, itching, sneezing,  nasal congestion or excess/ purulent secretions, ear ache,   fever, chills, sweats, unintended wt loss, pleuritic or exertional cp, hemoptysis,  orthopnea pnd or leg swelling, presyncope, palpitations, heartburn, abdominal pain, anorexia, nausea, vomiting, diarrhea  or change in bowel or urinary habits, change in stools or urine, dysuria,hematuria,  rash, arthralgias, visual complaints, headache, numbness weakness or ataxia or problems with walking or coordination,  change in mood/affect or memory.                  Objective:   Physical  Exam  amb elderly wf with  abn affect / sats ok at rest RA  08/05/2013        173 > 04/11/2015  184    04/16/13 175 lb (79.379 kg)  03/19/13 187 lb (84.823 kg)  02/24/13 182 lb 4.8 oz (82.691 kg)      HEENT: nl  turbinates, and orophanx. Nl external ear canals without cough reflex - edentulous with dentures in place    NECK :  without JVD/Nodes/TM/ nl carotid upstrokes bilaterally   LUNGS: no acc muscle use, clear to A and P bilaterally without cough on insp or exp maneuvers - min decrease  R base    CV:  RRR  no s3 or murmur or increase in P2, no edema   ABD:  soft and nontender with nl excursion in the supine position. No bruits or organomegaly, bowel sounds nl  MS:  warm without deformities, calf tenderness, cyanosis or clubbing  SKIN: warm and dry without lesions           I personally reviewed images and agree with radiology impression as follows:  CXR:  04/08/15 Elevation of the right hemidiaphragm again noted. Lungs remain grossly clear.     Assessment & Plan:

## 2015-04-14 DIAGNOSIS — R05 Cough: Secondary | ICD-10-CM | POA: Insufficient documentation

## 2015-04-14 DIAGNOSIS — R058 Other specified cough: Secondary | ICD-10-CM | POA: Insufficient documentation

## 2015-04-14 NOTE — Assessment & Plan Note (Addendum)
Complicated by hbp/ diastolic dysfunction / restrictive changes on spirometry 04/11/2015 and prob gerd   Body mass index is 35.94   Lab Results  Component Value Date   TSH 2.650 07/01/2013     Contributing to gerd tendency/ doe/reviewed the need and the process to achieve and maintain neg calorie balance > defer f/u primary care including intermittently monitoring thyroid status

## 2015-04-14 NOTE — Assessment & Plan Note (Addendum)
--   spirometry s obst 04/11/2015   04/11/2015  extensive coaching HFA effectiveness =    75% from a baseline of < 50%  Despite poor hfa, All goals of chronic asthma control met including optimal function and elimination of symptoms with minimal need for rescue therapy.  Contingencies discussed in full including contacting this office immediately if not controlling the symptoms using the rule of two's.

## 2015-04-14 NOTE — Assessment & Plan Note (Addendum)
1) See Echo 02/23/13 Left ventricle: The cavity size was normal. There was moderate concentric hypertrophy. Systolic function was normal. The estimated ejection fraction was in the range of 55% to 60%. Wall motion was normal; there were no regional wall motion abnormalities. Doppler parameters are consistent with abnormal left ventricular relaxation (grade 1 diastolic dysfunction). The E/e' ratio is >10, suggesting elevated LV Filling pressure. - Aortic valve: Trivial regurgitation. - Mitral valve: Mildly thickened leaflets . Trivial regurgitation. - Left atrium: Moderately dilated  2) Spriometry with nl f/v loop 11/21//14  3) Trial off acei started 04/17/2013  - Spirometry with restrictive changes only 04/11/2015 > see obesity  - 04/11/2015   Walked RA x one lap @ 185 stopped due to  Legs hurting slow pace, no sob and no desats  C/w obesity / deconditioning not limited by lung dz

## 2015-04-14 NOTE — Assessment & Plan Note (Signed)
Classic Upper airway cough syndrome, so named because it's frequently impossible to sort out how much is  CR/sinusitis with freq throat clearing (which can be related to primary GERD)   vs  causing  secondary (" extra esophageal")  GERD from wide swings in gastric pressure that occur with throat clearing, often  promoting self use of mint and menthol lozenges that reduce the lower esophageal sphincter tone and exacerbate the problem further in a cyclical fashion.   These are the same pts (now being labeled as having "irritable larynx syndrome" by some cough centers) who not infrequently have a history of having failed to tolerate ace inhibitors(as is the case here) ,  dry powder inhalers or biphosphonates or report having atypical reflux symptoms that don't respond to standard doses of PPI , and are easily confused as having aecopd or asthma flares by even experienced allergists/ pulmonologists.    For now rec max gerd rx/ trial of 1st gen h1 and return prn    I had an extended discussion with the patient reviewing all relevant studies completed to date and  lasting 15 to 20 minutes of a 25 minute visit    Each maintenance medication was reviewed in detail including most importantly the difference between maintenance and prns and under what circumstances the prns are to be triggered using an action plan format that is not reflected in the computer generated alphabetically organized AVS.    Please see instructions for details which were reviewed in writing and the patient given a copy highlighting the part that I personally wrote and discussed at today's ov.

## 2015-09-29 ENCOUNTER — Emergency Department (HOSPITAL_COMMUNITY): Payer: Medicare Other

## 2015-09-29 ENCOUNTER — Emergency Department (HOSPITAL_COMMUNITY)
Admission: EM | Admit: 2015-09-29 | Discharge: 2015-09-29 | Disposition: A | Discharge status: Home / Self Care | Payer: Medicare Other | Attending: Emergency Medicine | Admitting: Emergency Medicine

## 2015-09-29 ENCOUNTER — Encounter (HOSPITAL_COMMUNITY): Payer: Self-pay

## 2015-09-29 DIAGNOSIS — I10 Essential (primary) hypertension: Secondary | ICD-10-CM | POA: Insufficient documentation

## 2015-09-29 DIAGNOSIS — J40 Bronchitis, not specified as acute or chronic: Secondary | ICD-10-CM

## 2015-09-29 DIAGNOSIS — Z87891 Personal history of nicotine dependence: Secondary | ICD-10-CM | POA: Insufficient documentation

## 2015-09-29 DIAGNOSIS — Z880 Allergy status to penicillin: Secondary | ICD-10-CM | POA: Diagnosis not present

## 2015-09-29 DIAGNOSIS — K5900 Constipation, unspecified: Secondary | ICD-10-CM | POA: Insufficient documentation

## 2015-09-29 DIAGNOSIS — Z7951 Long term (current) use of inhaled steroids: Secondary | ICD-10-CM | POA: Insufficient documentation

## 2015-09-29 DIAGNOSIS — E119 Type 2 diabetes mellitus without complications: Secondary | ICD-10-CM | POA: Diagnosis not present

## 2015-09-29 DIAGNOSIS — F319 Bipolar disorder, unspecified: Secondary | ICD-10-CM | POA: Insufficient documentation

## 2015-09-29 DIAGNOSIS — F0390 Unspecified dementia without behavioral disturbance: Secondary | ICD-10-CM | POA: Insufficient documentation

## 2015-09-29 DIAGNOSIS — K589 Irritable bowel syndrome without diarrhea: Secondary | ICD-10-CM | POA: Diagnosis not present

## 2015-09-29 DIAGNOSIS — R0602 Shortness of breath: Secondary | ICD-10-CM | POA: Diagnosis present

## 2015-09-29 DIAGNOSIS — M1990 Unspecified osteoarthritis, unspecified site: Secondary | ICD-10-CM | POA: Diagnosis not present

## 2015-09-29 DIAGNOSIS — Z79899 Other long term (current) drug therapy: Secondary | ICD-10-CM | POA: Diagnosis not present

## 2015-09-29 DIAGNOSIS — G8929 Other chronic pain: Secondary | ICD-10-CM | POA: Diagnosis not present

## 2015-09-29 DIAGNOSIS — Z7902 Long term (current) use of antithrombotics/antiplatelets: Secondary | ICD-10-CM | POA: Diagnosis not present

## 2015-09-29 LAB — CBC WITH DIFFERENTIAL/PLATELET
BASOS PCT: 0 %
Basophils Absolute: 0 10*3/uL (ref 0.0–0.1)
EOS ABS: 0.1 10*3/uL (ref 0.0–0.7)
EOS PCT: 1 %
HEMATOCRIT: 35.4 % — AB (ref 36.0–46.0)
HEMOGLOBIN: 11.5 g/dL — AB (ref 12.0–15.0)
LYMPHS ABS: 3.6 10*3/uL (ref 0.7–4.0)
Lymphocytes Relative: 38 %
MCH: 29.8 pg (ref 26.0–34.0)
MCHC: 32.5 g/dL (ref 30.0–36.0)
MCV: 91.7 fL (ref 78.0–100.0)
MONOS PCT: 12 %
Monocytes Absolute: 1.1 10*3/uL — ABNORMAL HIGH (ref 0.1–1.0)
Neutro Abs: 4.8 10*3/uL (ref 1.7–7.7)
Neutrophils Relative %: 49 %
Platelets: 275 10*3/uL (ref 150–400)
RBC: 3.86 MIL/uL — ABNORMAL LOW (ref 3.87–5.11)
RDW: 15 % (ref 11.5–15.5)
WBC: 9.7 10*3/uL (ref 4.0–10.5)

## 2015-09-29 LAB — BRAIN NATRIURETIC PEPTIDE: B NATRIURETIC PEPTIDE 5: 48.9 pg/mL (ref 0.0–100.0)

## 2015-09-29 LAB — BASIC METABOLIC PANEL
Anion gap: 15 (ref 5–15)
BUN: 12 mg/dL (ref 6–20)
CALCIUM: 9.7 mg/dL (ref 8.9–10.3)
CHLORIDE: 100 mmol/L — AB (ref 101–111)
CO2: 27 mmol/L (ref 22–32)
CREATININE: 0.91 mg/dL (ref 0.44–1.00)
GFR calc non Af Amer: >60 mL/min (ref >60)
GLUCOSE: 113 mg/dL — AB (ref 65–99)
Potassium: 4.1 mmol/L (ref 3.5–5.1)
Sodium: 142 mmol/L (ref 135–145)

## 2015-09-29 LAB — TROPONIN I: Troponin I: <0.03 ng/mL (ref <0.031)

## 2015-09-29 MED ORDER — PREDNISONE 20 MG PO TABS: 60.0000 mg | ORAL_TABLET | Freq: Every day | ORAL | Status: DC

## 2015-09-29 MED ORDER — PREDNISONE 20 MG PO TABS
60.0000 mg | ORAL_TABLET | Freq: Once | ORAL | Status: AC
  Administered 2015-09-29: 60 mg via ORAL
  Filled 2015-09-29: qty 3

## 2015-09-29 NOTE — ED Notes (Signed)
[facility] from [facility]. Pt complains of shortness of breath starting last night after dinner around 8pm. Pt states she feels congested with a non-productive cough.

## 2015-09-29 NOTE — ED Notes (Signed)
Pt verbalizes understanding of intructions.

## 2015-09-29 NOTE — ED Notes (Signed)
pT  AWAITING RIDE TO nh GIVEN TUKEY TRAY AND DRINK.

## 2015-09-29 NOTE — Discharge Instructions (Signed)

## 2015-09-29 NOTE — ED Provider Notes (Addendum)
CSN: [id number]     Arrival date & time 09/29/15  0520 History   First MD Initiated Contact with Patient 09/29/15 [phone]     Chief Complaint  Patient presents with  . Shortness of Breath     (Consider location/radiation/quality/duration/timing/severity/associated sxs/prior Treatment) HPI Comments: Patient presents to the emergency department for evaluation of cough and shortness of breath. Symptoms began yesterday evening. She started to have scratchy throat, hoarse voice and feeling like she had allergies. She then noticed a cough. She took a cough syrup and used a cough drop with some improvement initially, but overnight her breathing worsened. She has had increasing cough that is productive of clear sputum. She was administered albuterol at the nursing home without improvement. Patient is not experiencing any chest pain. She has not noticed any fever.  Patient is a 75 y.o. female presenting with shortness of breath.  Shortness of Breath Associated symptoms: cough and sore throat     Past Medical History  Diagnosis Date  . Hypertension   . GERD (gastroesophageal reflux disease)   . Dementia   . IBS (irritable bowel syndrome)   . Arthritis   . Constipation   . Hiatal hernia   . Bipolar 1 disorder (HCC)   . Diabetes mellitus   . Bronchitis   . Chronic back pain    Past Surgical History  Procedure Laterality Date  . Gallbladder    . Partial hysterectomy    . Mandible surgery     Family History  Problem Relation Age of Onset  . Emphysema Mother   . Cancer Other    Social History  Substance Use Topics  . Smoking status: Former Smoker -- 0.50 packs/day for 2 years    Types: Cigarettes    Quit date: 05/28/1959  . Smokeless tobacco: Former Neurosurgeon    Types: Snuff    Quit date: 05/28/1959     Comment: Pt used snuff as a teenager  . Alcohol Use: No   OB History    No data available     Review of Systems  HENT: Positive for postnasal drip, sore throat and voice change.    Respiratory: Positive for cough and shortness of breath.   All other systems reviewed and are negative.     Allergies  Aminoglycosides; Phenothiazines; Aspirin; Codeine; Ivp dye; Morphine and related; Neomycin; Penicillins; Promethazine; Sulfa antibiotics; and Tetracyclines & related  Home Medications   Prior to Admission medications   Medication Sig Start Date End Date Taking? Authorizing Provider  acetaminophen (TYLENOL) 325 MG tablet Take 650 mg by mouth 3 (three) times daily.   Yes Historical Provider, MD  acetaminophen (TYLENOL) 500 MG tablet Take 500 mg by mouth every 4 (four) hours as needed for headache.   Yes Historical Provider, MD  albuterol (PROVENTIL HFA;VENTOLIN HFA) 108 (90 BASE) MCG/ACT inhaler Inhale 2 puffs into the lungs every 4 (four) hours as needed for wheezing or shortness of breath.   Yes Historical Provider, MD  albuterol (PROVENTIL) (2.5 MG/3ML) 0.083% nebulizer solution Take 2.5 mg by nebulization 2 (two) times daily.    Yes Historical Provider, MD  alum & mag hydroxide-simeth (GERI-LANTA) 200-200-20 MG/5ML suspension Take 30 mLs by mouth See admin instructions. Give 1 hour after meals and at bedtime as needed for indigestion   Yes Historical Provider, MD  amLODipine (NORVASC) 5 MG tablet Take 5 mg by mouth daily.   Yes Historical Provider, MD  antiseptic oral rinse (BIOTENE) LIQD 15 mLs by Mouth Rinse route at  bedtime. Swish and spit   Yes Historical Provider, MD  atorvastatin (LIPITOR) 40 MG tablet Take 40 mg by mouth daily.    Yes Historical Provider, MD  budesonide-formoterol (SYMBICORT) 160-4.5 MCG/ACT inhaler Inhale 2 puffs into the lungs 2 (two) times daily. Rinse mouth after use   Yes Historical Provider, MD  cholecalciferol (VITAMIN D) 1000 UNITS tablet Take 1,000 Units by mouth daily.    Yes Historical Provider, MD  clindamycin (CLEOCIN T) 1 % external solution Apply 1 application topically 2 (two) times daily as needed (to back).   Yes Historical  Provider, MD  clopidogrel (PLAVIX) 75 MG tablet Take 75 mg by mouth daily.    Yes Historical Provider, MD  donepezil (ARICEPT) 10 MG tablet Take 10 mg by mouth at bedtime.    Yes Historical Provider, MD  esomeprazole (NEXIUM) 40 MG capsule Take 40 mg by mouth daily at 12 noon.   Yes Historical Provider, MD  ferrous sulfate 325 (65 FE) MG tablet Take 325 mg by mouth daily with breakfast.   Yes Historical Provider, MD  fexofenadine (ALLEGRA) 180 MG tablet Take 180 mg by mouth daily.   Yes Historical Provider, MD  fluconazole (DIFLUCAN) 100 MG tablet Take 100 mg by mouth daily.   Yes Historical Provider, MD  fluticasone (FLONASE) 50 MCG/ACT nasal spray Place 2 sprays into both nostrils daily.    Yes Historical Provider, MD  furosemide (LASIX) 20 MG tablet Take 20 mg by mouth daily with breakfast.   Yes Historical Provider, MD  guaiFENesin (ROBITUSSIN) 100 MG/5ML liquid Take 10 mLs (200 mg total) by mouth 3 (three) times daily as needed for cough. 01/24/14  Yes Fayrene HelperBowie Tran, PA-C  ipratropium (ATROVENT) 0.02 % nebulizer solution Take 2.5 mLs (0.5 mg total) by nebulization every 6 (six) hours as needed for wheezing or shortness of breath. 04/08/15  Yes Elpidio AnisShari Upstill, PA-C  lamoTRIgine (LAMICTAL) 200 MG tablet Take 200 mg by mouth 2 (two) times daily.   Yes Historical Provider, MD  loperamide (IMODIUM) 2 MG capsule Take 2 mg by mouth as needed for diarrhea or loose stools. Do not exceed 8 doses in 24 hours   Yes Historical Provider, MD  LORazepam (ATIVAN) 1 MG tablet Take 1 mg by mouth every 12 (twelve) hours.   Yes Historical Provider, MD  magnesium hydroxide (MILK OF MAGNESIA) 400 MG/5ML suspension Take 30 mLs by mouth at bedtime as needed for mild constipation.   Yes Historical Provider, MD  magnesium oxide (MAG-OX) 400 MG tablet Take 400 mg by mouth daily.    Yes Historical Provider, MD  QUEtiapine (SEROQUEL) 100 MG tablet Take 100 mg by mouth 2 (two) times daily. Hold for sedation   Yes Historical  Provider, MD  ranitidine (ZANTAC) 150 MG tablet Take 150 mg by mouth 2 (two) times daily.   Yes Historical Provider, MD  senna-docusate (SENOKOT-S) 8.6-50 MG per tablet Take 1 tablet by mouth 2 (two) times daily.   Yes Historical Provider, MD  valsartan (DIOVAN) 320 MG tablet Take 1 tablet (320 mg total) by mouth daily. 04/16/13  Yes Nyoka CowdenMichael B Wert, MD  verapamil (CALAN-SR) 120 MG CR tablet Take 120 mg by mouth daily.    Yes Historical Provider, MD  Vitamins A & D (VITAMIN A & D) ointment Apply 1 application topically every 6 (six) hours as needed (rectal pain).    Yes Historical Provider, MD  hydrocortisone (ANUSOL-HC) 2.5 % rectal cream Place rectally 2 (two) times daily. Patient not taking: Reported on  09/29/2015 04/24/14   Nishant Dhungel, MD  predniSONE (DELTASONE) 20 MG tablet Take 2 tablets (40 mg total) by mouth daily. Patient not taking: Reported on 09/29/2015 04/08/15   Elpidio Anis, PA-C   BP 141/60 mmHg  Pulse 85  Resp 17  SpO2 97% Physical Exam  Constitutional: She is oriented to person, place, and time. She appears well-developed and well-nourished. No distress.  HENT:  Head: Normocephalic and atraumatic.  Right Ear: Hearing normal.  Left Ear: Hearing normal.  Nose: Nose normal.  Mouth/Throat: Oropharynx is clear and moist and mucous membranes are normal.  Eyes: Conjunctivae and EOM are normal. Pupils are equal, round, and reactive to light.  Neck: Normal range of motion. Neck supple.  Cardiovascular: Regular rhythm, S1 normal and S2 normal.  Exam reveals no gallop and no friction rub.   No murmur heard. Pulmonary/Chest: Effort normal. No respiratory distress. She has decreased breath sounds. She has wheezes. She exhibits no tenderness.  Abdominal: Soft. Normal appearance and bowel sounds are normal. There is no hepatosplenomegaly. There is no tenderness. There is no rebound, no guarding, no tenderness at McBurney's point and negative Murphy's sign. No hernia.   Musculoskeletal: Normal range of motion. She exhibits edema.  Neurological: She is alert and oriented to person, place, and time. She has normal strength. No cranial nerve deficit or sensory deficit. Coordination normal. GCS eye subscore is 4. GCS verbal subscore is 5. GCS motor subscore is 6.  Skin: Skin is warm, dry and intact. No rash noted. No cyanosis.  Psychiatric: She has a normal mood and affect. Her speech is normal and behavior is normal. Thought content normal.  Nursing note and vitals reviewed.   ED Course  Procedures (including critical care time) Labs Review Labs Reviewed  CBC WITH DIFFERENTIAL/PLATELET - Abnormal; Notable for the following:    RBC 3.86 (*)    Hemoglobin 11.5 (*)    HCT 35.4 (*)    Monocytes Absolute 1.1 (*)    All other components within normal limits  BASIC METABOLIC PANEL - Abnormal; Notable for the following:    Chloride 100 (*)    Glucose, Bld 113 (*)    All other components within normal limits  TROPONIN I  BRAIN NATRIURETIC PEPTIDE    Imaging Review Dg Chest 2 View  09/29/2015  CLINICAL DATA:  Shortness of breath, productive cough, and congestion since 8 p.m. last night. History of COPD. EXAM: CHEST  2 VIEW COMPARISON:  04/08/2015 FINDINGS: Generator pack over the left mid chest with lead tips extending to the left side of the neck base. Elevation of the right hemidiaphragm. Normal heart size and pulmonary vascularity. No focal airspace disease or consolidation in the lungs. No blunting of costophrenic angles. No pneumothorax. Mediastinal contours appear intact. Calcification of the aorta. Surgical clips in the right upper quadrant. IMPRESSION: Elevation of right hemidiaphragm. No evidence of active pulmonary disease. Electronically Signed   By: Burman Nieves M.D.   On: 09/29/2015 06:11   I have personally reviewed and evaluated these images and lab results as part of my medical decision-making.   EKG Interpretation   Date/Time:  Friday Sep 29 2015 05:28:37 EDT Ventricular Rate:  88 PR Interval:  178 QRS Duration: 94 QT Interval:  383 QTC Calculation: 463 R Axis:   23 Text Interpretation:  Sinus rhythm Low voltage, precordial leads No  significant change since last tracing Confirmed by Demetris Capell  MD,  Kadar Chance [phone]) on 09/29/2015 5:33:00 AM  MDM   Final diagnoses:  Bronchitis    Presents to the emergency department with complaints of shortness of breath and cough. Patient does utilize albuterol daily for bronchospasm. She did have mild wheezing apparent upon arrival but is not hypoxic. She is oxygenating at 100% on room air. Chest x-ray is clear, no evidence of pneumonia. She does not have any evidence of congestive heart failure or acute coronary syndrome. Patient appropriate for outpatient management with bronchodilator therapy and corticosteroids.  Gilda Crease, MD 09/29/15 1610  Gilda Crease, MD 09/29/15 [phone]

## 2015-11-26 IMAGING — CR DG CHEST 2V
2 series · 2 of 2 positions shown · non-contrast
Comparison: PA and lateral chest x-ray July 25, 2013.

CLINICAL DATA: Recent pneumonia now with cough ; history of
diabetes and hypertension

EXAM:
CHEST  2 VIEW

[w chest lat]
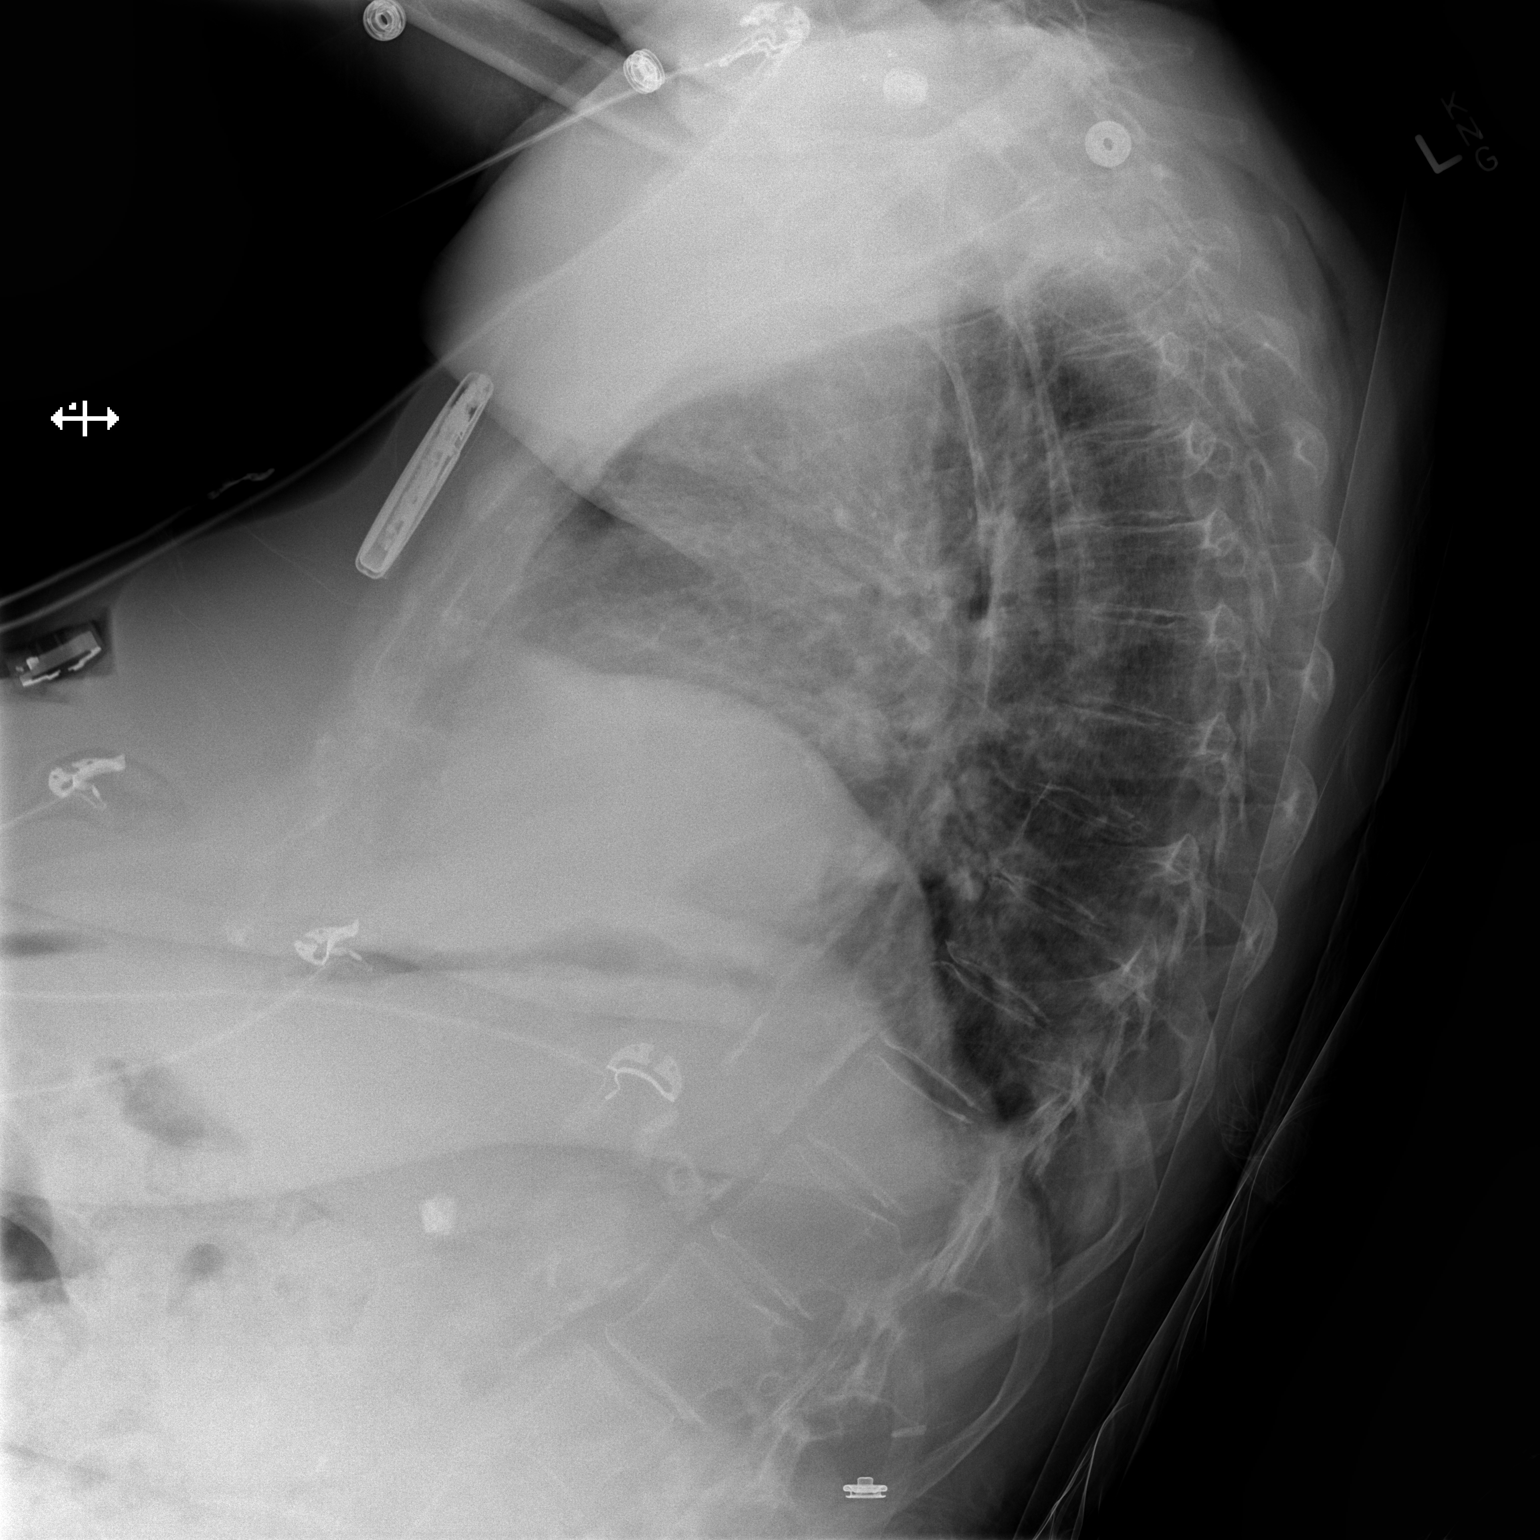

[x chest ap]
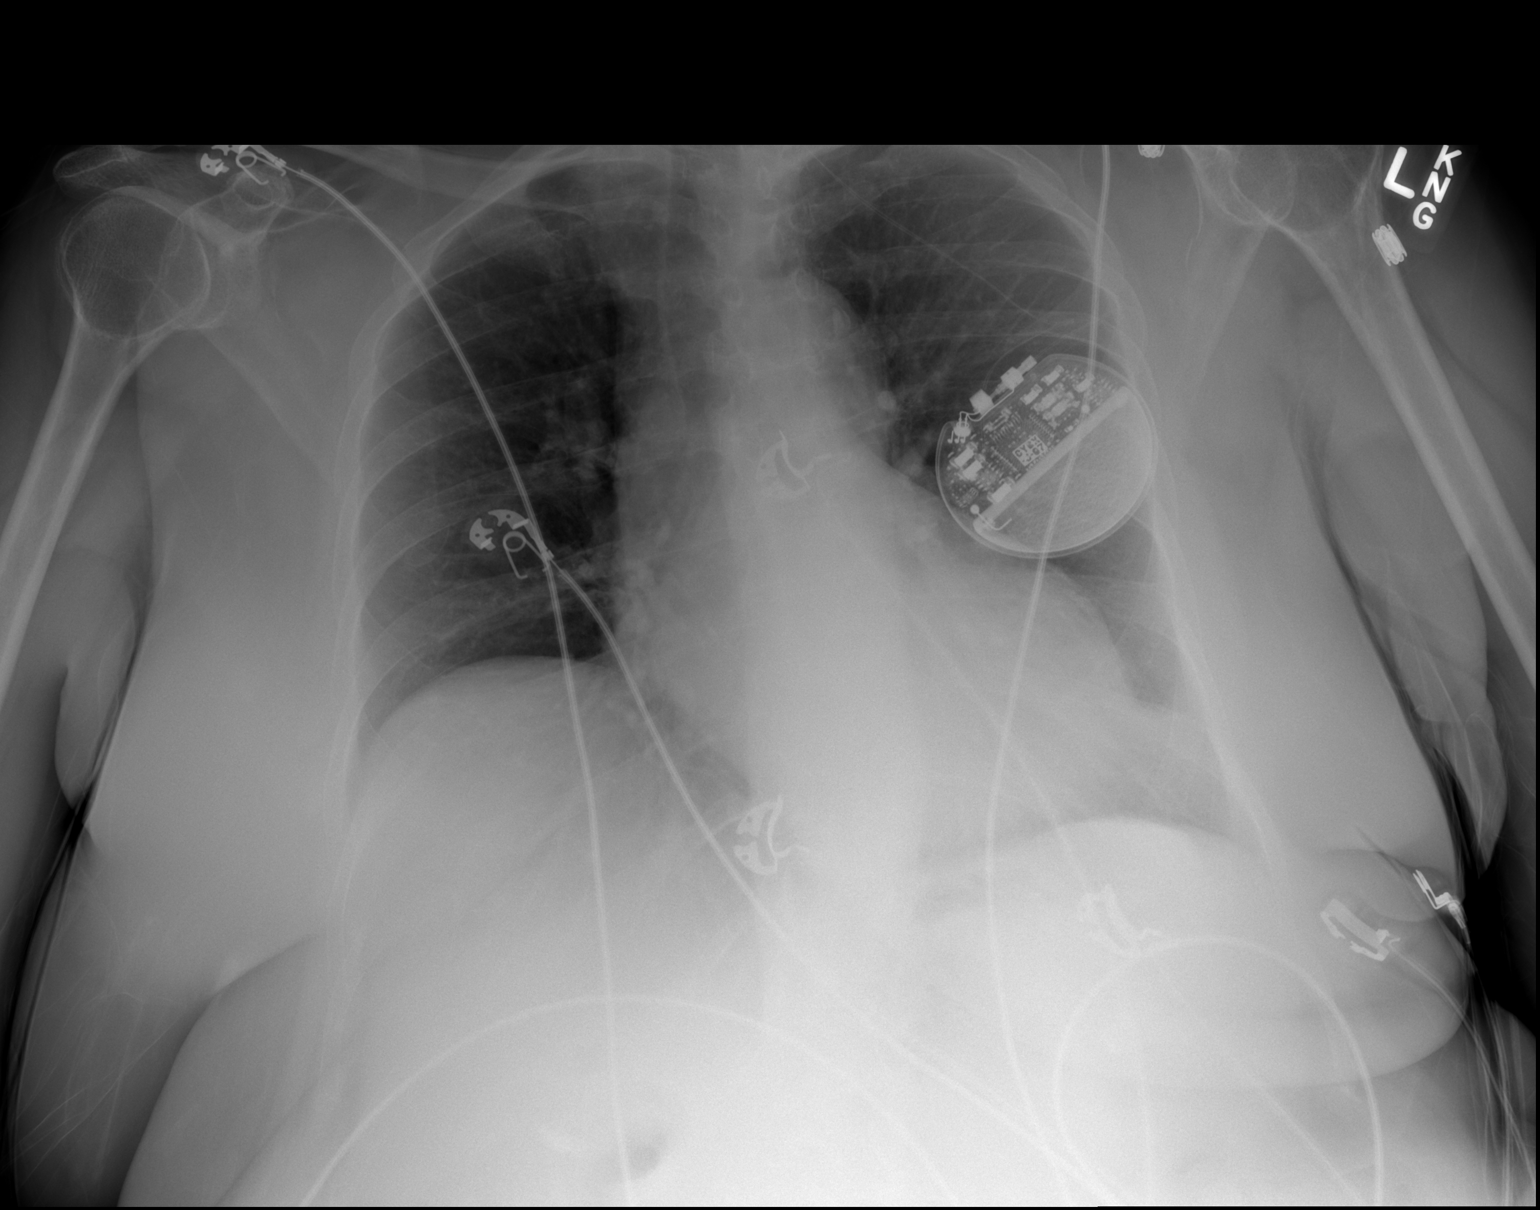

[2 of 2 positions shown; findings below may reference images not displayed]

FINDINGS: There is chronic elevation of the right hemidiaphragm. The left lung
is adequately inflated and clear. There is no pleural effusion. The
cardiac silhouette is mildly enlarged but stable. The pulmonary
vascularity is not engorged. There is a neurostimulator generator
projecting over the left mid lung with electrodes extending into the
cervical region. The bony thorax is unremarkable where visualized.
IMPRESSION: There is chronic elevation of the right hemidiaphragm. There is no
acute cardiopulmonary abnormality.

## 2016-03-01 ENCOUNTER — Emergency Department (HOSPITAL_COMMUNITY): Payer: Medicare Other

## 2016-03-01 ENCOUNTER — Encounter (HOSPITAL_COMMUNITY): Payer: Self-pay | Admitting: Emergency Medicine

## 2016-03-01 ENCOUNTER — Emergency Department (HOSPITAL_COMMUNITY)
Admission: EM | Admit: 2016-03-01 | Discharge: 2016-03-02 | Disposition: A | Discharge status: Home / Self Care | Payer: Medicare Other | Attending: Emergency Medicine | Admitting: Emergency Medicine

## 2016-03-01 DIAGNOSIS — R05 Cough: Secondary | ICD-10-CM | POA: Insufficient documentation

## 2016-03-01 DIAGNOSIS — I5030 Unspecified diastolic (congestive) heart failure: Secondary | ICD-10-CM | POA: Insufficient documentation

## 2016-03-01 DIAGNOSIS — E119 Type 2 diabetes mellitus without complications: Secondary | ICD-10-CM | POA: Insufficient documentation

## 2016-03-01 DIAGNOSIS — Z79899 Other long term (current) drug therapy: Secondary | ICD-10-CM | POA: Diagnosis not present

## 2016-03-01 DIAGNOSIS — Z87891 Personal history of nicotine dependence: Secondary | ICD-10-CM | POA: Diagnosis not present

## 2016-03-01 DIAGNOSIS — Z7951 Long term (current) use of inhaled steroids: Secondary | ICD-10-CM | POA: Insufficient documentation

## 2016-03-01 DIAGNOSIS — R609 Edema, unspecified: Secondary | ICD-10-CM | POA: Insufficient documentation

## 2016-03-01 DIAGNOSIS — R0602 Shortness of breath: Secondary | ICD-10-CM | POA: Diagnosis present

## 2016-03-01 DIAGNOSIS — I110 Hypertensive heart disease with heart failure: Secondary | ICD-10-CM | POA: Insufficient documentation

## 2016-03-01 DIAGNOSIS — R059 Cough, unspecified: Secondary | ICD-10-CM

## 2016-03-01 LAB — BASIC METABOLIC PANEL
Anion gap: 9 (ref 5–15)
BUN: 13 mg/dL (ref 6–20)
CHLORIDE: 104 mmol/L (ref 101–111)
CO2: 28 mmol/L (ref 22–32)
CREATININE: 0.9 mg/dL (ref 0.44–1.00)
Calcium: 9.3 mg/dL (ref 8.9–10.3)
GFR calc non Af Amer: >60 mL/min (ref >60)
Glucose, Bld: 118 mg/dL — ABNORMAL HIGH (ref 65–99)
Potassium: 3.8 mmol/L (ref 3.5–5.1)
SODIUM: 141 mmol/L (ref 135–145)

## 2016-03-01 LAB — CBC WITH DIFFERENTIAL/PLATELET
Basophils Absolute: 0 10*3/uL (ref 0.0–0.1)
Basophils Relative: 0 %
EOS PCT: 2 %
Eosinophils Absolute: 0.1 10*3/uL (ref 0.0–0.7)
HEMATOCRIT: 34.5 % — AB (ref 36.0–46.0)
HEMOGLOBIN: 11.3 g/dL — AB (ref 12.0–15.0)
LYMPHS ABS: 2.7 10*3/uL (ref 0.7–4.0)
LYMPHS PCT: 42 %
MCH: 30.5 pg (ref 26.0–34.0)
MCHC: 32.8 g/dL (ref 30.0–36.0)
MCV: 93.2 fL (ref 78.0–100.0)
Monocytes Absolute: 0.6 10*3/uL (ref 0.1–1.0)
Monocytes Relative: 9 %
NEUTROS ABS: 3 10*3/uL (ref 1.7–7.7)
Neutrophils Relative %: 47 %
PLATELETS: 202 10*3/uL (ref 150–400)
RBC: 3.7 MIL/uL — AB (ref 3.87–5.11)
RDW: 14.5 % (ref 11.5–15.5)
WBC: 6.4 10*3/uL (ref 4.0–10.5)

## 2016-03-01 LAB — URINALYSIS, ROUTINE W REFLEX MICROSCOPIC
Bilirubin Urine: NEGATIVE
Glucose, UA: NEGATIVE mg/dL
Hgb urine dipstick: NEGATIVE
Ketones, ur: NEGATIVE mg/dL
NITRITE: NEGATIVE
PH: 6.5 (ref 5.0–8.0)
Protein, ur: NEGATIVE mg/dL
SPECIFIC GRAVITY, URINE: 1.007 (ref 1.005–1.030)

## 2016-03-01 LAB — D-DIMER, QUANTITATIVE: D-Dimer, Quant: 0.42 ug/mL-FEU (ref 0.00–0.50)

## 2016-03-01 LAB — I-STAT TROPONIN, ED
Troponin i, poc: 0 ng/mL (ref 0.00–0.08)
Troponin i, poc: 0.02 ng/mL (ref 0.00–0.08)

## 2016-03-01 LAB — URINE MICROSCOPIC-ADD ON

## 2016-03-01 LAB — BRAIN NATRIURETIC PEPTIDE: B Natriuretic Peptide: 126.2 pg/mL — ABNORMAL HIGH (ref 0.0–100.0)

## 2016-03-01 MED ORDER — IPRATROPIUM-ALBUTEROL 0.5-2.5 (3) MG/3ML IN SOLN
3.0000 mL | Freq: Once | RESPIRATORY_TRACT | Status: AC
  Administered 2016-03-01: 3 mL via RESPIRATORY_TRACT
  Filled 2016-03-01: qty 3

## 2016-03-01 MED ORDER — LAMOTRIGINE 100 MG PO TABS
200.0000 mg | ORAL_TABLET | Freq: Once | ORAL | Status: AC
  Administered 2016-03-01: 200 mg via ORAL
  Filled 2016-03-01: qty 2

## 2016-03-01 MED ORDER — DONEPEZIL HCL 5 MG PO TABS
10.0000 mg | ORAL_TABLET | Freq: Every day | ORAL | Status: DC
  Administered 2016-03-01: 10 mg via ORAL
  Filled 2016-03-01: qty 2

## 2016-03-01 MED ORDER — QUETIAPINE FUMARATE 50 MG PO TABS
150.0000 mg | ORAL_TABLET | Freq: Every day | ORAL | Status: DC
  Administered 2016-03-01: 150 mg via ORAL
  Filled 2016-03-01: qty 1

## 2016-03-01 MED ORDER — LORATADINE 10 MG PO TABS: 10.0000 mg | ORAL_TABLET | Freq: Every day | ORAL | 0 refills | Status: DC

## 2016-03-01 NOTE — ED Notes (Signed)
Patient transported to CT 

## 2016-03-01 NOTE — ED Notes (Signed)
[facility], spoke to Sterlinghristina, caregiver.  Gave report.

## 2016-03-01 NOTE — ED Provider Notes (Signed)
[facility]-EMERGENCY DEPT Provider Note   CSN: [id number] Arrival date & time: 03/01/16  1456     History   Chief Complaint Chief Complaint  Patient presents with  . Shortness of Breath    HPI Cindy Padilla is a 75 y.o. female.  75 year old female with past medical history including bipolar disorder, chronic back pain, type 2 diabetes mellitus, GERD, hypertension, dementia, dCHF who p/w shortness of breath. The patient reports a 10 day history of gradually worsening shortness of breath associated with a cough. She feels like there is rattling in her chest but she has not been able to cough up any mucus. 10 days ago she was started on Keflex QID by the primary care provider in her nursing facility. She reports no significant change in her symptoms. For the past 4 days she has had central, nonradiating chest pain which she describes "pain in my bones" and she states that it is similar to when one has the flu. She endorses myalgias but denies any fevers. She endorses post-nasal drip. She has had some diarrhea but no vomiting. She has not noticed any change in the shortness of breath with exertion. She denies any abdominal pain or urinary symptoms. She reports mild improvement after albuterol given by EMS.   The history is provided by the patient.  Shortness of Breath     Past Medical History:  Diagnosis Date  . Arthritis   . Bipolar 1 disorder (HCC)   . Bronchitis   . Chronic back pain   . Constipation   . Dementia   . Diabetes mellitus   . GERD (gastroesophageal reflux disease)   . Hiatal hernia   . Hypertension   . IBS (irritable bowel syndrome)     Patient Active Problem List   Diagnosis Date Noted  . Morbid obesity (HCC) 04/14/2015  . Upper airway cough syndrome 04/14/2015  . Ventral hernia 04/24/2014  . Constipation 04/24/2014  . Abdominal pain, epigastric 04/24/2014  . Partial small bowel obstruction 04/22/2014  . Hemorrhoids 04/22/2014  . Abnormality of gait  09/30/2013  . 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 Diastolic heart failure (HCC) 07/27/2013  . Dementia with behavioral disturbance 07/27/2013  . Acute respiratory failure (HCC) 02/21/2013  . Weakness 02/09/2013  . Hypokalemia 02/09/2013  . Hyponatremia 02/09/2013  . SOB (shortness of breath) 02/09/2013  . HTN (hypertension) 02/09/2013  . Other and unspecified hyperlipidemia 02/09/2013    Past Surgical History:  Procedure Laterality Date  . gallbladder    . MANDIBLE SURGERY    . PARTIAL HYSTERECTOMY      OB History    No data available       Home Medications    Prior to Admission medications   Medication Sig Start Date End Date Taking? Authorizing Provider  acetaminophen (TYLENOL) 325 MG tablet Take 650 mg by mouth 3 (three) times daily.   Yes Historical Provider, MD  amLODipine (NORVASC) 5 MG tablet Take 5 mg by mouth daily.   Yes Historical Provider, MD  antiseptic oral rinse (BIOTENE) LIQD 15 mLs by Mouth Rinse route at bedtime. Swish and spit   Yes Historical Provider, MD  atorvastatin (LIPITOR) 40 MG tablet Take 40 mg by mouth daily.    Yes Historical Provider, MD  budesonide-formoterol (SYMBICORT) 160-4.5 MCG/ACT inhaler Inhale 2 puffs into the lungs 2 (two) times daily. Rinse mouth after use   Yes Historical Provider, MD  cholecalciferol (VITAMIN D) 1000 UNITS tablet Take 1,000 Units by mouth  daily.    Yes Historical Provider, MD  clopidogrel (PLAVIX) 75 MG tablet Take 75 mg by mouth daily.    Yes Historical Provider, MD  donepezil (ARICEPT) 10 MG tablet Take 10 mg by mouth at bedtime.    Yes Historical Provider, MD  esomeprazole (NEXIUM) 20 MG capsule Take 20 mg by mouth daily at 12 noon.   Yes Historical Provider, MD  ferrous sulfate 325 (65 FE) MG tablet Take 325 mg by mouth daily with breakfast.   Yes Historical Provider, MD  fexofenadine (ALLEGRA) 180 MG tablet Take 180 mg by mouth daily.   Yes Historical Provider, MD  fluticasone  (FLONASE) 50 MCG/ACT nasal spray Place 2 sprays into both nostrils daily.    Yes Historical Provider, MD  furosemide (LASIX) 20 MG tablet Take 20 mg by mouth daily with breakfast.   Yes Historical Provider, MD  guaiFENesin (ROBITUSSIN) 100 MG/5ML liquid Take 10 mLs (200 mg total) by mouth 3 (three) times daily as needed for cough. Patient taking differently: Take 200 mg by mouth 3 (three) times daily. For 10 Days. 01/24/14  Yes Fayrene Helper, PA-C  ipratropium-albuterol (DUONEB) 0.5-2.5 (3) MG/3ML SOLN Take 3 mLs by nebulization 3 (three) times daily.   Yes Historical Provider, MD  lamoTRIgine (LAMICTAL) 200 MG tablet Take 200 mg by mouth 2 (two) times daily.   Yes Historical Provider, MD  LORazepam (ATIVAN) 1 MG tablet Take 1 mg by mouth every 12 (twelve) hours.   Yes Historical Provider, MD  magnesium oxide (MAG-OX) 400 MG tablet Take 400 mg by mouth daily.    Yes Historical Provider, MD  QUEtiapine (SEROQUEL) 100 MG tablet Take 150 mg by mouth 2 (two) times daily. Hold for sedation   Yes Historical Provider, MD  ranitidine (ZANTAC) 150 MG tablet Take 150 mg by mouth 2 (two) times daily.   Yes Historical Provider, MD  senna-docusate (SENOKOT-S) 8.6-50 MG per tablet Take 1 tablet by mouth 2 (two) times daily.   Yes Historical Provider, MD  triamcinolone cream (KENALOG) 0.1 % Apply 1 application topically 2 (two) times daily.   Yes Historical Provider, MD  valsartan (DIOVAN) 320 MG tablet Take 1 tablet (320 mg total) by mouth daily. 04/16/13  Yes Nyoka Cowden, MD  verapamil (CALAN-SR) 120 MG CR tablet Take 120 mg by mouth daily.    Yes Historical Provider, MD  acetaminophen (TYLENOL) 500 MG tablet Take 500 mg by mouth every 4 (four) hours as needed for headache.    Historical Provider, MD  albuterol (PROVENTIL HFA;VENTOLIN HFA) 108 (90 BASE) MCG/ACT inhaler Inhale 2 puffs into the lungs every 4 (four) hours as needed for wheezing or shortness of breath.    Historical Provider, MD  albuterol (PROVENTIL)  (2.5 MG/3ML) 0.083% nebulizer solution Take 2.5 mg by nebulization 2 (two) times daily.     Historical Provider, MD  alum & mag hydroxide-simeth (GERI-LANTA) 200-200-20 MG/5ML suspension Take 30 mLs by mouth See admin instructions. Give 1 hour after meals and at bedtime as needed for indigestion    Historical Provider, MD  clindamycin (CLEOCIN T) 1 % external solution Apply 1 application topically 2 (two) times daily as needed (to back).    Historical Provider, MD  fluconazole (DIFLUCAN) 100 MG tablet Take 100 mg by mouth daily.    Historical Provider, MD  hydrocortisone (ANUSOL-HC) 2.5 % rectal cream Place rectally 2 (two) times daily. Patient not taking: Reported on 03/01/2016 04/24/14   Nishant Dhungel, MD  ipratropium (ATROVENT) 0.02 % nebulizer  solution Take 2.5 mLs (0.5 mg total) by nebulization every 6 (six) hours as needed for wheezing or shortness of breath. 04/08/15   Elpidio Anis, PA-C  loperamide (IMODIUM) 2 MG capsule Take 2 mg by mouth as needed for diarrhea or loose stools. Do not exceed 8 doses in 24 hours    Historical Provider, MD  magnesium hydroxide (MILK OF MAGNESIA) 400 MG/5ML suspension Take 30 mLs by mouth at bedtime as needed for mild constipation.    Historical Provider, MD  predniSONE (DELTASONE) 20 MG tablet Take 3 tablets (60 mg total) by mouth daily with breakfast. Patient not taking: Reported on 03/01/2016 09/29/15   Gilda Crease, MD  Vitamins A & D (VITAMIN A & D) ointment Apply 1 application topically every 6 (six) hours as needed (rectal pain).     Historical Provider, MD    Family History Family History  Problem Relation Age of Onset  . Emphysema Mother   . Cancer Other     Social History Social History  Substance Use Topics  . Smoking status: Former Smoker    Packs/day: 0.50    Years: 2.00    Types: Cigarettes    Quit date: 05/28/1959  . Smokeless tobacco: Former Neurosurgeon    Types: Snuff    Quit date: 05/28/1959     Comment: Pt used snuff as a  teenager  . Alcohol use No     Allergies   Aminoglycosides; Phenothiazines; Aspirin; Codeine; Ivp dye [iodinated diagnostic agents]; Morphine and related; Neomycin; Penicillins; Promethazine; Sulfa antibiotics; and Tetracyclines & related   Review of Systems Review of Systems  Respiratory: Positive for shortness of breath.    10 Systems reviewed and are negative for acute change except as noted in the HPI.   Physical Exam Updated Vital Signs BP 162/60   Pulse 63   Temp 98.3 F (36.8 C) (Oral)   Resp 17   Ht 5' (1.524 m)   Wt 184 lb (83.5 kg)   SpO2 98%   BMI 35.94 kg/m   Physical Exam  Constitutional: She is oriented to person, place, and time. She appears well-developed and well-nourished. No distress.  HENT:  Head: Normocephalic and atraumatic.  Moist mucous membranes  Eyes: Conjunctivae are normal. Pupils are equal, round, and reactive to light.  Neck: Neck supple.  Cardiovascular: Normal rate, regular rhythm and normal heart sounds.   No murmur heard. Pulmonary/Chest: She exhibits no tenderness.  Mild dyspneic without respiratory distress, rhonchi b/l  Abdominal: Soft. Bowel sounds are normal. She exhibits no distension. There is no tenderness.  Musculoskeletal: She exhibits edema (2+ pitting edema BLE).  Neurological: She is alert and oriented to person, place, and time.  Fluent speech  Skin: Skin is warm and dry.  Chronic skin changes  B/l lower legs  Psychiatric: She has a normal mood and affect. Judgment normal.  Nursing note and vitals reviewed.    ED Treatments / Results  Labs (all labs ordered are listed, but only abnormal results are displayed) Labs Reviewed  BASIC METABOLIC PANEL - Abnormal; Notable for the following:       Result Value   Glucose, Bld 118 (*)    All other components within normal limits  BRAIN NATRIURETIC PEPTIDE - Abnormal; Notable for the following:    B Natriuretic Peptide 126.2 (*)    All other components within normal  limits  CBC WITH DIFFERENTIAL/PLATELET - Abnormal; Notable for the following:    RBC 3.70 (*)    Hemoglobin 11.3 (*)  HCT 34.5 (*)    All other components within normal limits  URINALYSIS, ROUTINE W REFLEX MICROSCOPIC (NOT AT [facility] - Abnormal; Notable for the following:    Leukocytes, UA SMALL (*)    All other components within normal limits  URINE MICROSCOPIC-ADD ON - Abnormal; Notable for the following:    Squamous Epithelial / LPF 0-5 (*)    Bacteria, UA RARE (*)    All other components within normal limits  D-DIMER, QUANTITATIVE (NOT AT [facility])  Rosezena Sensor, ED  Rosezena Sensor, ED    EKG  EKG Interpretation  Date/Time:  Friday March 01 2016 16:18:29 EDT Ventricular Rate:  68 PR Interval:    QRS Duration: 100 QT Interval:  434 QTC Calculation: 462 R Axis:   27 Text Interpretation:  Sinus rhythm No significant change since last tracing Confirmed by LITTLE MD, RACHEL [phone]) on 03/01/2016 4:46:02 PM       Radiology Dg Chest 2 View  Result Date: 03/01/2016 CLINICAL DATA:  Shortness of breath and congestion for 10 days. EXAM: CHEST  2 VIEW COMPARISON:  09/29/2015 FINDINGS: Cardiomediastinal silhouette is normal. Mediastinal contours appear intact. There is no evidence of focal airspace consolidation, pleural effusion or pneumothorax. Osseous structures are without acute abnormality. Osteoarthritic changes of the thoracic spine with exaggerated kyphosis seen. Soft tissues are grossly normal. IMPRESSION: No active cardiopulmonary disease. Electronically Signed   By: Ted Mcalpine M.D.   On: 03/01/2016 17:02   Ct Chest Wo Contrast  Result Date: 03/01/2016 CLINICAL DATA:  Acute onset of shortness of breath and generalized chest pain. Nonproductive cough. Initial encounter. EXAM: CT CHEST WITHOUT CONTRAST TECHNIQUE: Multidetector CT imaging of the chest was performed following the standard protocol without IV contrast. COMPARISON:  Chest radiograph performed earlier  today at 4:55 p.m., and CTA of the chest performed 01/07/2011 FINDINGS: Cardiovascular: Scattered coronary artery calcifications are seen. Mild calcification is noted at the aortic valve. Mild calcification is seen along the aortic arch and descending thoracic aorta. The heart is grossly unremarkable in appearance. Mild calcification is seen along the proximal great vessels. Mediastinum/Nodes: The mediastinum is otherwise unremarkable. No mediastinal lymphadenopathy is seen. No pericardial effusion is identified. The thyroid gland is unremarkable in appearance. No axillary lymphadenopathy is appreciated. Lungs/Pleura: Minimal bilateral dependent subsegmental atelectasis is noted. Minimal scarring is seen at the left lung base. The lungs are otherwise grossly clear. No focal consolidation, pleural effusion or pneumothorax is seen. No masses are identified. Upper Abdomen: Scattered cysts within the liver have increased mildly in size from 2012, measuring up to 4.1 cm in size. The patient is status post cholecystectomy, with clips noted at the gallbladder fossa. 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 The spleen is unremarkable in appearance. The visualized portions of the pancreas, adrenal glands and kidneys are within normal limits. There is diffuse calcification along the proximal aorta and along the proximal renal arteries bilaterally. Note is made of herniation of the antrum of the stomach and pylorus into a moderate to large right upper quadrant anterior abdominal wall hernia, without evidence for obstruction or strangulation at this time. Musculoskeletal: No acute osseous abnormalities are identified. The visualized musculature is unremarkable in appearance. A left-sided chest wall metallic device is noted, with a lead extending into the left side of the neck. IMPRESSION: 1. No acute abnormality seen within the chest. 2. Minimal bilateral dependent subsegmental atelectasis noted. Lungs  otherwise grossly clear. 3. 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 No evidence for obstruction or strangulation at this time. 4. Scattered coronary artery calcifications seen. Mild calcification along the thoracic aorta. 5. Mild interval increase in size of scattered hepatic cysts. 6. 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 7. Diffuse aortic atherosclerosis noted along the proximal abdominal aorta, with calcification along the proximal renal arteries bilaterally. Electronically Signed   By: Roanna RaiderJeffery  Chang M.D.   On: 03/01/2016 21:31    Procedures Procedures (including critical care time)  Medications Ordered in ED Medications  donepezil (ARICEPT) tablet 10 mg (10 mg Oral Given 03/01/16 2309)  QUEtiapine (SEROQUEL) tablet 150 mg (150 mg Oral Given 03/01/16 2309)  lamoTRIgine (LAMICTAL) tablet 200 mg (200 mg Oral Given 03/01/16 2309)  ipratropium-albuterol (DUONEB) 0.5-2.5 (3) MG/3ML nebulizer solution 3 mL (3 mLs Nebulization Given 03/01/16 2243)     Initial Impression / Assessment and Plan / ED Course  I have reviewed the triage vital signs and the nursing notes.  Pertinent labs & imaging results that were available during my care of the patient were reviewed by me and considered in my medical decision making (see chart for details).  Clinical Course   PT w/ 10d of progressive SOB, no improvement w/ 10 days of keflex, and 4d of chest pain which She describes as "pain in my bones." She was well-appearing on exam with reassuring vital signs, afebrile. She had rhonchi bilaterally but no respiratory distress. EKG without ischemic changes. Serial troponins negative and the d-dimer negative. BNP 126, CBC and BMP c/w previous. UA without signs of infection. Chest x-ray negative acute. Because of the persistence of the patient's symptoms and her complaint of shortness of  breath, obtained a noncontrasted CT of the chest to evaluate for occult pneumonia. CT showed no acute findings to explain the patient's symptoms. Her chart does show that she has a history of shortness of breath and chronic bronchitis symptoms. I have discussed supportive care and because of her complaint of postnasal drip that is been worse recently, will start on allergy medication. Her description of her chest pain is "bone pain" that she states feels like the flu and I doubt ACS. She is well appearing on re-examination without wheezes. Pt discharged in satisfactory condition.  Final Clinical Impressions(s) / ED Diagnoses   Final diagnoses:  None    New Prescriptions New Prescriptions   No medications on file     Laurence Spatesachel Morgan Little, MD 03/01/16 2322

## 2016-03-01 NOTE — ED Notes (Signed)
Patient returned from CT

## 2016-03-01 NOTE — ED Notes (Signed)
Patient transported to X-ray 

## 2016-03-01 NOTE — ED Triage Notes (Signed)
Per EMS: PT has has SOB for the last 10 days. In the last 4 days her chest has started to hurt but Pt thinks it is from all the coughing. Cough is non-productive. Pt received an albuterol tx on the ambulance.  BP 140/80, HR 90, RR 16, SPO2 96%, 98.3 F

## 2016-03-02 NOTE — ED Notes (Signed)
Patient d/c'd to facility.  Report called.  F/U and medications reviewed.  Patient verbalized understanding.

## 2016-06-10 ENCOUNTER — Encounter (HOSPITAL_COMMUNITY): Payer: Self-pay | Admitting: Emergency Medicine

## 2016-06-10 ENCOUNTER — Emergency Department (HOSPITAL_COMMUNITY): Payer: Medicare Other

## 2016-06-10 ENCOUNTER — Inpatient Hospital Stay (HOSPITAL_COMMUNITY)
Admission: EM | Admit: 2016-06-10 | Discharge: 2016-06-16 | DRG: 871 | Disposition: A | Discharge status: Skilled Nursing Facility | Payer: Medicare Other | Attending: Internal Medicine | Admitting: Internal Medicine

## 2016-06-10 DIAGNOSIS — Z66 Do not resuscitate: Secondary | ICD-10-CM | POA: Diagnosis present

## 2016-06-10 DIAGNOSIS — J189 Pneumonia, unspecified organism: Secondary | ICD-10-CM | POA: Diagnosis not present

## 2016-06-10 DIAGNOSIS — E872 Acidosis: Secondary | ICD-10-CM | POA: Diagnosis present

## 2016-06-10 DIAGNOSIS — A403 Sepsis due to Streptococcus pneumoniae: Secondary | ICD-10-CM | POA: Diagnosis not present

## 2016-06-10 DIAGNOSIS — Z9889 Other specified postprocedural states: Secondary | ICD-10-CM | POA: Diagnosis not present

## 2016-06-10 DIAGNOSIS — J45909 Unspecified asthma, uncomplicated: Secondary | ICD-10-CM | POA: Diagnosis not present

## 2016-06-10 DIAGNOSIS — Z809 Family history of malignant neoplasm, unspecified: Secondary | ICD-10-CM

## 2016-06-10 DIAGNOSIS — Z90711 Acquired absence of uterus with remaining cervical stump: Secondary | ICD-10-CM | POA: Diagnosis not present

## 2016-06-10 DIAGNOSIS — Z888 Allergy status to other drugs, medicaments and biological substances status: Secondary | ICD-10-CM

## 2016-06-10 DIAGNOSIS — I110 Hypertensive heart disease with heart failure: Secondary | ICD-10-CM | POA: Diagnosis present

## 2016-06-10 DIAGNOSIS — R6520 Severe sepsis without septic shock: Secondary | ICD-10-CM | POA: Diagnosis present

## 2016-06-10 DIAGNOSIS — L0390 Cellulitis, unspecified: Secondary | ICD-10-CM | POA: Diagnosis present

## 2016-06-10 DIAGNOSIS — J13 Pneumonia due to Streptococcus pneumoniae: Secondary | ICD-10-CM | POA: Diagnosis present

## 2016-06-10 DIAGNOSIS — Z885 Allergy status to narcotic agent status: Secondary | ICD-10-CM | POA: Diagnosis not present

## 2016-06-10 DIAGNOSIS — Z880 Allergy status to penicillin: Secondary | ICD-10-CM

## 2016-06-10 DIAGNOSIS — I10 Essential (primary) hypertension: Secondary | ICD-10-CM | POA: Diagnosis not present

## 2016-06-10 DIAGNOSIS — R0602 Shortness of breath: Secondary | ICD-10-CM

## 2016-06-10 DIAGNOSIS — Z886 Allergy status to analgesic agent status: Secondary | ICD-10-CM

## 2016-06-10 DIAGNOSIS — J9601 Acute respiratory failure with hypoxia: Secondary | ICD-10-CM | POA: Diagnosis present

## 2016-06-10 DIAGNOSIS — G8929 Other chronic pain: Secondary | ICD-10-CM | POA: Diagnosis present

## 2016-06-10 DIAGNOSIS — J440 Chronic obstructive pulmonary disease with acute lower respiratory infection: Secondary | ICD-10-CM | POA: Diagnosis present

## 2016-06-10 DIAGNOSIS — K219 Gastro-esophageal reflux disease without esophagitis: Secondary | ICD-10-CM | POA: Diagnosis present

## 2016-06-10 DIAGNOSIS — I5032 Chronic diastolic (congestive) heart failure: Secondary | ICD-10-CM | POA: Diagnosis present

## 2016-06-10 DIAGNOSIS — Z87891 Personal history of nicotine dependence: Secondary | ICD-10-CM | POA: Diagnosis not present

## 2016-06-10 DIAGNOSIS — R062 Wheezing: Secondary | ICD-10-CM

## 2016-06-10 DIAGNOSIS — J45901 Unspecified asthma with (acute) exacerbation: Secondary | ICD-10-CM | POA: Diagnosis present

## 2016-06-10 DIAGNOSIS — K439 Ventral hernia without obstruction or gangrene: Secondary | ICD-10-CM | POA: Diagnosis present

## 2016-06-10 DIAGNOSIS — E860 Dehydration: Secondary | ICD-10-CM | POA: Diagnosis present

## 2016-06-10 DIAGNOSIS — J441 Chronic obstructive pulmonary disease with (acute) exacerbation: Secondary | ICD-10-CM | POA: Diagnosis present

## 2016-06-10 DIAGNOSIS — F0390 Unspecified dementia without behavioral disturbance: Secondary | ICD-10-CM | POA: Diagnosis present

## 2016-06-10 DIAGNOSIS — Z79899 Other long term (current) drug therapy: Secondary | ICD-10-CM

## 2016-06-10 DIAGNOSIS — A419 Sepsis, unspecified organism: Principal | ICD-10-CM | POA: Diagnosis present

## 2016-06-10 DIAGNOSIS — K7689 Other specified diseases of liver: Secondary | ICD-10-CM | POA: Diagnosis present

## 2016-06-10 DIAGNOSIS — Z825 Family history of asthma and other chronic lower respiratory diseases: Secondary | ICD-10-CM

## 2016-06-10 DIAGNOSIS — E119 Type 2 diabetes mellitus without complications: Secondary | ICD-10-CM | POA: Diagnosis present

## 2016-06-10 DIAGNOSIS — Z6835 Body mass index (BMI) 35.0-35.9, adult: Secondary | ICD-10-CM

## 2016-06-10 DIAGNOSIS — M549 Dorsalgia, unspecified: Secondary | ICD-10-CM | POA: Diagnosis present

## 2016-06-10 DIAGNOSIS — R918 Other nonspecific abnormal finding of lung field: Secondary | ICD-10-CM

## 2016-06-10 DIAGNOSIS — R748 Abnormal levels of other serum enzymes: Secondary | ICD-10-CM | POA: Diagnosis present

## 2016-06-10 DIAGNOSIS — Z882 Allergy status to sulfonamides status: Secondary | ICD-10-CM

## 2016-06-10 DIAGNOSIS — J9600 Acute respiratory failure, unspecified whether with hypoxia or hypercapnia: Secondary | ICD-10-CM | POA: Diagnosis present

## 2016-06-10 DIAGNOSIS — Z91041 Radiographic dye allergy status: Secondary | ICD-10-CM

## 2016-06-10 DIAGNOSIS — Z7951 Long term (current) use of inhaled steroids: Secondary | ICD-10-CM

## 2016-06-10 HISTORY — DX: Chronic obstructive pulmonary disease, unspecified: J44.9

## 2016-06-10 LAB — BASIC METABOLIC PANEL
ANION GAP: 13 (ref 5–15)
BUN: 11 mg/dL (ref 6–20)
CO2: 26 mmol/L (ref 22–32)
Calcium: 9 mg/dL (ref 8.9–10.3)
Chloride: 94 mmol/L — ABNORMAL LOW (ref 101–111)
Creatinine, Ser: 0.83 mg/dL (ref 0.44–1.00)
GFR calc Af Amer: >60 mL/min (ref >60)
GLUCOSE: 109 mg/dL — AB (ref 65–99)
POTASSIUM: 4.3 mmol/L (ref 3.5–5.1)
Sodium: 133 mmol/L — ABNORMAL LOW (ref 135–145)

## 2016-06-10 LAB — I-STAT TROPONIN, ED: Troponin i, poc: 0.01 ng/mL (ref 0.00–0.08)

## 2016-06-10 LAB — CBC WITH DIFFERENTIAL/PLATELET
BASOS PCT: 0 %
Basophils Absolute: 0 10*3/uL (ref 0.0–0.1)
EOS PCT: 0 %
Eosinophils Absolute: 0 10*3/uL (ref 0.0–0.7)
HEMATOCRIT: 37 % (ref 36.0–46.0)
Hemoglobin: 12.4 g/dL (ref 12.0–15.0)
Lymphocytes Relative: 13 %
Lymphs Abs: 3.6 10*3/uL (ref 0.7–4.0)
MCH: 30.5 pg (ref 26.0–34.0)
MCHC: 33.5 g/dL (ref 30.0–36.0)
MCV: 91.1 fL (ref 78.0–100.0)
Monocytes Absolute: 2.2 10*3/uL — ABNORMAL HIGH (ref 0.1–1.0)
Monocytes Relative: 8 %
NEUTROS PCT: 79 %
Neutro Abs: 21.6 10*3/uL — ABNORMAL HIGH (ref 1.7–7.7)
PLATELETS: 226 10*3/uL (ref 150–400)
RBC: 4.06 MIL/uL (ref 3.87–5.11)
RDW: 14.6 % (ref 11.5–15.5)
WBC: 27.4 10*3/uL — ABNORMAL HIGH (ref 4.0–10.5)

## 2016-06-10 LAB — I-STAT CG4 LACTIC ACID, ED: LACTIC ACID, VENOUS: 2.47 mmol/L — AB (ref 0.5–1.9)

## 2016-06-10 LAB — BRAIN NATRIURETIC PEPTIDE: B Natriuretic Peptide: 70.1 pg/mL (ref 0.0–100.0)

## 2016-06-10 MED ORDER — LEVOFLOXACIN IN D5W 750 MG/150ML IV SOLN
750.0000 mg | Freq: Once | INTRAVENOUS | Status: AC
  Administered 2016-06-10: 750 mg via INTRAVENOUS
  Filled 2016-06-10: qty 150

## 2016-06-10 MED ORDER — ACETAMINOPHEN 325 MG PO TABS
650.0000 mg | ORAL_TABLET | Freq: Once | ORAL | Status: AC
  Administered 2016-06-10: 650 mg via ORAL
  Filled 2016-06-10: qty 2

## 2016-06-10 MED ORDER — SODIUM CHLORIDE 0.9 % IV BOLUS (SEPSIS)
1000.0000 mL | Freq: Once | INTRAVENOUS | Status: AC
  Administered 2016-06-10: 1000 mL via INTRAVENOUS

## 2016-06-10 MED ORDER — LORAZEPAM 2 MG/ML IJ SOLN
1.0000 mg | Freq: Once | INTRAMUSCULAR | Status: AC
  Administered 2016-06-10: 1 mg via INTRAVENOUS
  Filled 2016-06-10: qty 1

## 2016-06-10 MED ORDER — ALBUTEROL (5 MG/ML) CONTINUOUS INHALATION SOLN
10.0000 mg/h | INHALATION_SOLUTION | Freq: Once | RESPIRATORY_TRACT | Status: AC
  Administered 2016-06-10: 10 mg/h via RESPIRATORY_TRACT
  Filled 2016-06-10: qty 20

## 2016-06-10 MED ORDER — MAGNESIUM SULFATE 2 GM/50ML IV SOLN
2.0000 g | Freq: Once | INTRAVENOUS | Status: AC
  Administered 2016-06-10: 2 g via INTRAVENOUS
  Filled 2016-06-10: qty 50

## 2016-06-10 NOTE — ED Provider Notes (Signed)
[facility]-EMERGENCY DEPT Provider Note   CSN: [id number] Arrival date & time: 06/10/16  2117     History   Chief Complaint Chief Complaint  Patient presents with  . Shortness of Breath    HPI Cindy Padilla is a 76 y.o. female.  HPI Cindy Padilla is a 76 y.o. female with PMH significant for Bipolar disorder, COPD, dementia, DM, GERD, HTN, and IBS who presents with 1 day history of non-productive cough with associated shortness of breath, chest tightness, nasal congestion, and scratchy throat.  Unknown fever.  No dizziness, N/V/D, abdominal pain, or urinary symptoms.  She is coming from assisted living, [place], where she reports multiple sick contacts including her roommate.  She received 5 mg albuterol, 0.5 mg atrovent, 125 mg solumedrol. She reports some improvement.  She states she saw the facility physician yesterday and was started on prednisone and an antibiotics, but cannot recall the name.  Blood pressure 158/67, pulse 93, temperature 100.2 F (37.9 C), temperature source Oral, resp. rate 22, height 5' (1.524 m), weight 83.5 kg, SpO2 96 %.   Past Medical History:  Diagnosis Date  . Arthritis   . Bipolar 1 disorder (HCC)   . Bronchitis   . Chronic back pain   . Constipation   . COPD (chronic obstructive pulmonary disease) (HCC)   . Dementia   . Diabetes mellitus   . GERD (gastroesophageal reflux disease)   . Hiatal hernia   . Hypertension   . IBS (irritable bowel syndrome)     Patient Active Problem List   Diagnosis Date Noted  . Sepsis (HCC) 06/10/2016  . Morbid obesity (HCC) 04/14/2015  . Upper airway cough syndrome 04/14/2015  . Ventral hernia 04/24/2014  . Constipation 04/24/2014  . Abdominal pain, epigastric 04/24/2014  . Partial small bowel obstruction 04/22/2014  . Hemorrhoids 04/22/2014  . Abnormality of gait 09/30/2013  . 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 Diastolic heart failure (HCC)  40/98/1191  . Dementia with behavioral disturbance 07/27/2013  . Acute respiratory failure (HCC) 02/21/2013  . Weakness 02/09/2013  . Hypokalemia 02/09/2013  . Hyponatremia 02/09/2013  . SOB (shortness of breath) 02/09/2013  . HTN (hypertension) 02/09/2013  . Other and unspecified hyperlipidemia 02/09/2013    Past Surgical History:  Procedure Laterality Date  . gallbladder    . MANDIBLE SURGERY    . PARTIAL HYSTERECTOMY      OB History    No data available       Home Medications    Prior to Admission medications   Medication Sig Start Date End Date Taking? Authorizing Provider  acetaminophen (TYLENOL) 325 MG tablet Take 650 mg by mouth 3 (three) times daily.   Yes Historical Provider, MD  acetaminophen (TYLENOL) 500 MG tablet Take 500 mg by mouth every 4 (four) hours as needed for headache.   Yes Historical Provider, MD  albuterol (PROVENTIL HFA;VENTOLIN HFA) 108 (90 BASE) MCG/ACT inhaler Inhale 2 puffs into the lungs every 4 (four) hours as needed for wheezing or shortness of breath.   Yes Historical Provider, MD  alum & mag hydroxide-simeth (GERI-LANTA) 200-200-20 MG/5ML suspension Take 30 mLs by mouth See admin instructions. Give 1 hour after meals and at bedtime as needed for indigestion   Yes Historical Provider, MD  amLODipine (NORVASC) 5 MG tablet Take 5 mg by mouth daily.   Yes Historical Provider, MD  antiseptic oral rinse (BIOTENE) LIQD 15 mLs by Mouth Rinse route at bedtime. Swish and spit  Yes Historical Provider, MD  atorvastatin (LIPITOR) 40 MG tablet Take 40 mg by mouth daily.    Yes Historical Provider, MD  budesonide-formoterol (SYMBICORT) 160-4.5 MCG/ACT inhaler Inhale 2 puffs into the lungs 2 (two) times daily. Rinse mouth after use   Yes Historical Provider, MD  cholecalciferol (VITAMIN D) 1000 UNITS tablet Take 1,000 Units by mouth daily.    Yes Historical Provider, MD  clopidogrel (PLAVIX) 75 MG tablet Take 75 mg by mouth daily.    Yes Historical Provider,  MD  donepezil (ARICEPT) 10 MG tablet Take 10 mg by mouth at bedtime.    Yes Historical Provider, MD  esomeprazole (NEXIUM) 20 MG capsule Take 20 mg by mouth daily at 12 noon.   Yes Historical Provider, MD  ferrous sulfate 325 (65 FE) MG tablet Take 325 mg by mouth daily with breakfast.   Yes Historical Provider, MD  fexofenadine (ALLEGRA) 180 MG tablet Take 180 mg by mouth daily.   Yes Historical Provider, MD  fluticasone (FLONASE) 50 MCG/ACT nasal spray Place 2 sprays into both nostrils daily.    Yes Historical Provider, MD  furosemide (LASIX) 20 MG tablet Take 20 mg by mouth daily with breakfast.   Yes Historical Provider, MD  guaiFENesin (ROBITUSSIN) 100 MG/5ML liquid Take 10 mLs (200 mg total) by mouth 3 (three) times daily as needed for cough. 01/24/14  Yes Fayrene Helper, PA-C  ipratropium (ATROVENT) 0.02 % nebulizer solution Take 2.5 mLs (0.5 mg total) by nebulization every 6 (six) hours as needed for wheezing or shortness of breath. 04/08/15  Yes Shari Upstill, PA-C  ipratropium-albuterol (DUONEB) 0.5-2.5 (3) MG/3ML SOLN Take 3 mLs by nebulization every 6 (six) hours.    Yes Historical Provider, MD  lamoTRIgine (LAMICTAL) 200 MG tablet Take 200 mg by mouth 2 (two) times daily.   Yes Historical Provider, MD  lidocaine (ASPERCREME W/LIDOCAINE) 4 % cream Apply 1 application topically 3 (three) times daily as needed (pain).   Yes Historical Provider, MD  loperamide (IMODIUM) 2 MG capsule Take 2 mg by mouth as needed for diarrhea or loose stools. Do not exceed 8 doses in 24 hours   Yes Historical Provider, MD  LORazepam (ATIVAN) 1 MG tablet Take 1 mg by mouth every 12 (twelve) hours.   Yes Historical Provider, MD  magnesium hydroxide (MILK OF MAGNESIA) 400 MG/5ML suspension Take 30 mLs by mouth at bedtime as needed for mild constipation.   Yes Historical Provider, MD  magnesium oxide (MAG-OX) 400 MG tablet Take 400 mg by mouth daily.    Yes Historical Provider, MD  methylPREDNISolone (MEDROL DOSEPAK) 4  MG TBPK tablet Take 4-32 mg by mouth daily.   Yes Historical Provider, MD  montelukast (SINGULAIR) 10 MG tablet Take 10 mg by mouth at bedtime.   Yes Historical Provider, MD  phenylephrine-shark liver oil-mineral oil-petrolatum (PREPARATION H) 0.25-3-14-71.9 % rectal ointment Place 1 application rectally 2 (two) times daily as needed for hemorrhoids.   Yes Historical Provider, MD  QUEtiapine (SEROQUEL) 100 MG tablet Take 150 mg by mouth 2 (two) times daily. Hold for sedation   Yes Historical Provider, MD  ranitidine (ZANTAC) 150 MG tablet Take 150 mg by mouth 2 (two) times daily.   Yes Historical Provider, MD  senna-docusate (SENOKOT-S) 8.6-50 MG per tablet Take 1 tablet by mouth 2 (two) times daily.   Yes Historical Provider, MD  triamcinolone cream (KENALOG) 0.1 % Apply 1 application topically daily as needed (irritation).    Yes Historical Provider, MD  valsartan (DIOVAN)  320 MG tablet Take 1 tablet (320 mg total) by mouth daily. 04/16/13  Yes Nyoka CowdenMichael B Wert, MD  verapamil (CALAN-SR) 120 MG CR tablet Take 120 mg by mouth daily.    Yes Historical Provider, MD  Vitamins A & D (VITAMIN A & D) ointment Apply 1 application topically every 6 (six) hours as needed (rectal pain).    Yes Historical Provider, MD    Family History Family History  Problem Relation Age of Onset  . Emphysema Mother   . Cancer Other     Social History Social History  Substance Use Topics  . Smoking status: Former Smoker    Packs/day: 0.50    Years: 2.00    Types: Cigarettes    Quit date: 05/28/1959  . Smokeless tobacco: Former NeurosurgeonUser    Types: Snuff    Quit date: 05/28/1959     Comment: Pt used snuff as a teenager  . Alcohol use No     Allergies   Aminoglycosides; Aspirin; Codeine; Ivp dye [iodinated diagnostic agents]; Morphine and related; Neomycin; Penicillins; Phenothiazines; Promethazine; Sulfa antibiotics; and Tetracyclines & related   Review of Systems Review of Systems All other systems negative unless  otherwise stated in HPI   Physical Exam Updated Vital Signs BP 143/98   Pulse 95   Temp 100.2 F (37.9 C) (Oral)   Resp 21   Ht 5' (1.524 m)   Wt 83.5 kg   SpO2 98%   BMI 35.94 kg/m   Physical Exam  Constitutional: She is oriented to person, place, and time. She appears well-developed and well-nourished.  Non-toxic appearance. She does not have a sickly appearance. She does not appear ill.  HENT:  Head: Normocephalic and atraumatic.  Mouth/Throat: Oropharynx is clear and moist.  Eyes: Conjunctivae are normal. Pupils are equal, round, and reactive to light.  Neck: Normal range of motion. Neck supple.  Cardiovascular: Normal rate and regular rhythm.   Pulmonary/Chest: Effort normal. No accessory muscle usage or stridor. No respiratory distress. She has no wheezes. She has rhonchi. She has no rales.  Audible wheezing. Able to speak in full sentences without difficulty.  96% oxygen saturation on RA.   Abdominal: Soft. Bowel sounds are normal. She exhibits no distension. There is no tenderness.  Musculoskeletal: Normal range of motion.  Lymphadenopathy:    She has no cervical adenopathy.  Neurological: She is alert and oriented to person, place, and time.  Speech clear without dysarthria.  Skin: Skin is warm and dry.  Psychiatric: She has a normal mood and affect. Her behavior is normal.     ED Treatments / Results  Labs (all labs ordered are listed, but only abnormal results are displayed) Labs Reviewed  CBC WITH DIFFERENTIAL/PLATELET - Abnormal; Notable for the following:       Result Value   WBC 27.4 (*)    Neutro Abs 21.6 (*)    Monocytes Absolute 2.2 (*)    All other components within normal limits  BASIC METABOLIC PANEL - Abnormal; Notable for the following:    Sodium 133 (*)    Chloride 94 (*)    Glucose, Bld 109 (*)    All other components within normal limits  I-STAT CG4 LACTIC ACID, ED - Abnormal; Notable for the following:    Lactic Acid, Venous 2.47 (*)     All other components within normal limits  CULTURE, BLOOD (ROUTINE X 2)  CULTURE, BLOOD (ROUTINE X 2)  URINE CULTURE  BRAIN NATRIURETIC PEPTIDE  INFLUENZA PANEL BY  PCR (TYPE A & B)  URINALYSIS, ROUTINE W REFLEX MICROSCOPIC  I-STAT TROPOININ, ED    EKG  EKG Interpretation  Date/Time:  Monday June 10 2016 21:22:15 EST Ventricular Rate:  92 PR Interval:    QRS Duration: 88 QT Interval:  355 QTC Calculation: 440 R Axis:   37 Text Interpretation:  Normal sinus rhythm Low voltage, precordial leads Nonspecific T abnormalities, lateral leads Baseline artfiact Within limitations, no significant change from previous Confirmed by ISAACS MD, Sheria Lang [phone]) on 06/10/2016 10:14:28 PM       Radiology Dg Chest 2 View  Result Date: 06/10/2016 CLINICAL DATA:  Cough, congestion, fever, wheezing for 1 week. History of COPD, hypertension. EXAM: CHEST  2 VIEW COMPARISON:  CT chest March 01, 2016 FINDINGS: Approximate 4 cm masslike consolidation LEFT upper lobe. The cardiac silhouette is mildly enlarged. Mildly calcified aortic knob. Increased lung volumes. Mildly elevated RIGHT hemidiaphragm. No pleural effusion. No pneumothorax. Vagal stimulator LEFT chest. Osseous structures are nonsuspicious. IMPRESSION: Masslike consolidation LEFT upper lobe, recommend CT chest with contrast. Mild cardiomegaly. Electronically Signed   By: Awilda Metro M.D.   On: 06/10/2016 22:21    Procedures Procedures (including critical care time)  Medications Ordered in ED Medications  levofloxacin (LEVAQUIN) IVPB 750 mg (750 mg Intravenous New Bag/Given 06/10/16 2314)  acetaminophen (TYLENOL) tablet 650 mg (650 mg Oral Given 06/10/16 2145)  magnesium sulfate IVPB 2 g 50 mL (2 g Intravenous New Bag/Given 06/10/16 2153)  albuterol (PROVENTIL,VENTOLIN) solution continuous neb (10 mg/hr Nebulization Given 06/10/16 2224)  sodium chloride 0.9 % bolus 1,000 mL (1,000 mLs Intravenous New Bag/Given 06/10/16 2159)  LORazepam  (ATIVAN) injection 1 mg (1 mg Intravenous Given 06/10/16 2319)     Initial Impression / Assessment and Plan / ED Course  I have reviewed the triage vital signs and the nursing notes.  Pertinent labs & imaging results that were available during my care of the patient were reviewed by me and considered in my medical decision making (see chart for details).  Clinical Course    Patient presents with shortness of breath and cough.  She is febrile on arrival with a temp of 100.2.  Audible wheezing.  Lungs with diffuse rhonchi.  She received 125 mg solumedrol, 10 albuterol, and 0.5 atrovent with EMS.  Mild improvement.  She is not hypoxic or RA.  Able to speak in full sentences.  NO respiratory distress.  Lactic slightly elevated at 2.47, leukocytosis 27.4.  CXR with masslike consolidation of LUL.  Recommend chest CT; however, she has an allergy to IV dye.  Patient receiving IVF and IV Levaquin for possible CAP.  Influenza pending.  She will need admission for IV abx and CT after pre-medication. Appreciate TRH.  Case has been discussed with and seen by Dr. Erma Heritage who agrees with the above plan for admission.   Final Clinical Impressions(s) / ED Diagnoses   Final diagnoses:  Shortness of breath  Sepsis, due to unspecified organism [facility])    New Prescriptions New Prescriptions   No medications on file     Cheri Fowler, PA-C 06/10/16 2356    Shaune Pollack, MD 06/12/16 [phone]

## 2016-06-10 NOTE — ED Triage Notes (Signed)
Per EMS: Pt from [facility]. Pt c/o SOB and difficulty breathing. Hx of COPD. Cough for 1 week. Prone to get pneumonia. Rhonci per auscultation. Pt received 5 of albuterol, 2.5 atrovent, 125 solumedrol. 95 on room air. 99 with neb.

## 2016-06-10 NOTE — ED Notes (Signed)
Call when premedicated for CT b/c of contrast

## 2016-06-11 ENCOUNTER — Encounter (HOSPITAL_COMMUNITY): Payer: Self-pay

## 2016-06-11 ENCOUNTER — Inpatient Hospital Stay (HOSPITAL_COMMUNITY): Payer: Medicare Other

## 2016-06-11 DIAGNOSIS — J189 Pneumonia, unspecified organism: Secondary | ICD-10-CM | POA: Diagnosis present

## 2016-06-11 LAB — I-STAT CG4 LACTIC ACID, ED: Lactic Acid, Venous: 3.3 mmol/L (ref 0.5–1.9)

## 2016-06-11 LAB — PROCALCITONIN: Procalcitonin: 1.81 ng/mL

## 2016-06-11 LAB — COMPREHENSIVE METABOLIC PANEL
ALBUMIN: 3.2 g/dL — AB (ref 3.5–5.0)
ALT: 17 U/L (ref 14–54)
ANION GAP: 12 (ref 5–15)
AST: 18 U/L (ref 15–41)
Alkaline Phosphatase: 75 U/L (ref 38–126)
BILIRUBIN TOTAL: 0.4 mg/dL (ref 0.3–1.2)
BUN: 10 mg/dL (ref 6–20)
CHLORIDE: 100 mmol/L — AB (ref 101–111)
CO2: 22 mmol/L (ref 22–32)
Calcium: 8.3 mg/dL — ABNORMAL LOW (ref 8.9–10.3)
Creatinine, Ser: 0.75 mg/dL (ref 0.44–1.00)
GFR calc Af Amer: >60 mL/min (ref >60)
GFR calc non Af Amer: >60 mL/min (ref >60)
GLUCOSE: 169 mg/dL — AB (ref 65–99)
POTASSIUM: 4.5 mmol/L (ref 3.5–5.1)
SODIUM: 134 mmol/L — AB (ref 135–145)
TOTAL PROTEIN: 6.4 g/dL — AB (ref 6.5–8.1)

## 2016-06-11 LAB — CBC WITH DIFFERENTIAL/PLATELET
BASOS ABS: 0 10*3/uL (ref 0.0–0.1)
Basophils Relative: 0 %
EOS ABS: 0 10*3/uL (ref 0.0–0.7)
Eosinophils Relative: 0 %
HEMATOCRIT: 34.2 % — AB (ref 36.0–46.0)
Hemoglobin: 11.4 g/dL — ABNORMAL LOW (ref 12.0–15.0)
LYMPHS ABS: 1.3 10*3/uL (ref 0.7–4.0)
LYMPHS PCT: 4 %
MCH: 30.7 pg (ref 26.0–34.0)
MCHC: 33.3 g/dL (ref 30.0–36.0)
MCV: 92.2 fL (ref 78.0–100.0)
MONOS PCT: 1 %
Monocytes Absolute: 0.3 10*3/uL (ref 0.1–1.0)
Neutro Abs: 29.7 10*3/uL — ABNORMAL HIGH (ref 1.7–7.7)
Neutrophils Relative %: 95 %
Platelets: 189 10*3/uL (ref 150–400)
RBC: 3.71 MIL/uL — AB (ref 3.87–5.11)
RDW: 15.1 % (ref 11.5–15.5)
WBC: 31.3 10*3/uL — AB (ref 4.0–10.5)

## 2016-06-11 LAB — URINALYSIS, ROUTINE W REFLEX MICROSCOPIC
Bilirubin Urine: NEGATIVE
Glucose, UA: NEGATIVE mg/dL
Hgb urine dipstick: NEGATIVE
Ketones, ur: NEGATIVE mg/dL
Nitrite: NEGATIVE
Protein, ur: NEGATIVE mg/dL
SPECIFIC GRAVITY, URINE: 1.006 (ref 1.005–1.030)
pH: 8 (ref 5.0–8.0)

## 2016-06-11 LAB — INFLUENZA PANEL BY PCR (TYPE A & B)
Influenza A By PCR: NEGATIVE
Influenza B By PCR: NEGATIVE

## 2016-06-11 LAB — CBC
HCT: 32.7 % — ABNORMAL LOW (ref 36.0–46.0)
HEMOGLOBIN: 10.7 g/dL — AB (ref 12.0–15.0)
MCH: 30.1 pg (ref 26.0–34.0)
MCHC: 32.7 g/dL (ref 30.0–36.0)
MCV: 92.1 fL (ref 78.0–100.0)
PLATELETS: 219 10*3/uL (ref 150–400)
RBC: 3.55 MIL/uL — ABNORMAL LOW (ref 3.87–5.11)
RDW: 15.3 % (ref 11.5–15.5)
WBC: 25.2 10*3/uL — ABNORMAL HIGH (ref 4.0–10.5)

## 2016-06-11 LAB — LACTIC ACID, PLASMA
LACTIC ACID, VENOUS: 2.5 mmol/L — AB (ref 0.5–1.9)
LACTIC ACID, VENOUS: 3.3 mmol/L — AB (ref 0.5–1.9)
Lactic Acid, Venous: 4.4 mmol/L (ref 0.5–1.9)

## 2016-06-11 LAB — STREP PNEUMONIAE URINARY ANTIGEN: STREP PNEUMO URINARY ANTIGEN: POSITIVE — AB

## 2016-06-11 LAB — TROPONIN I: TROPONIN I: 0.04 ng/mL — AB (ref <0.03)

## 2016-06-11 LAB — APTT: APTT: 30 s (ref 24–36)

## 2016-06-11 LAB — MRSA PCR SCREENING: MRSA BY PCR: NEGATIVE

## 2016-06-11 LAB — PROTIME-INR
INR: 1.16
Prothrombin Time: 14.9 seconds (ref 11.4–15.2)

## 2016-06-11 MED ORDER — ONDANSETRON HCL 4 MG/2ML IJ SOLN: 4.0000 mg | Freq: Four times a day (QID) | INTRAMUSCULAR | Status: DC | PRN

## 2016-06-11 MED ORDER — GUAIFENESIN 100 MG/5ML PO SOLN
200.0000 mg | Freq: Three times a day (TID) | ORAL | Status: DC | PRN
  Administered 2016-06-12 – 2016-06-13 (×3): 200 mg via ORAL
  Filled 2016-06-11 (×3): qty 10

## 2016-06-11 MED ORDER — MAGNESIUM OXIDE 400 (241.3 MG) MG PO TABS
400.0000 mg | ORAL_TABLET | Freq: Every day | ORAL | Status: DC
  Administered 2016-06-11 – 2016-06-16 (×6): 400 mg via ORAL
  Filled 2016-06-11 (×6): qty 1

## 2016-06-11 MED ORDER — GUAIFENESIN ER 600 MG PO TB12
600.0000 mg | ORAL_TABLET | Freq: Two times a day (BID) | ORAL | Status: DC
  Administered 2016-06-11 – 2016-06-16 (×9): 600 mg via ORAL
  Filled 2016-06-11 (×10): qty 1

## 2016-06-11 MED ORDER — DIPHENHYDRAMINE HCL 25 MG PO CAPS
50.0000 mg | ORAL_CAPSULE | Freq: Once | ORAL | Status: AC
  Administered 2016-06-11: 50 mg via ORAL
  Filled 2016-06-11: qty 2

## 2016-06-11 MED ORDER — SODIUM CHLORIDE 0.9 % IV BOLUS (SEPSIS)
1000.0000 mL | Freq: Once | INTRAVENOUS | Status: AC
  Administered 2016-06-11: 1000 mL via INTRAVENOUS

## 2016-06-11 MED ORDER — HYDROCORTISONE NA SUCCINATE PF 100 MG IJ SOLR
100.0000 mg | Freq: Once | INTRAMUSCULAR | Status: AC
  Administered 2016-06-11: 100 mg via INTRAVENOUS
  Filled 2016-06-11: qty 2

## 2016-06-11 MED ORDER — IRBESARTAN 300 MG PO TABS
300.0000 mg | ORAL_TABLET | Freq: Every day | ORAL | Status: DC
  Administered 2016-06-11 – 2016-06-16 (×6): 300 mg via ORAL
  Filled 2016-06-11 (×6): qty 1

## 2016-06-11 MED ORDER — SENNOSIDES-DOCUSATE SODIUM 8.6-50 MG PO TABS
1.0000 | ORAL_TABLET | Freq: Two times a day (BID) | ORAL | Status: DC
  Administered 2016-06-11 – 2016-06-16 (×11): 1 via ORAL
  Filled 2016-06-11 (×11): qty 1

## 2016-06-11 MED ORDER — BUDESONIDE 0.25 MG/2ML IN SUSP
0.2500 mg | Freq: Two times a day (BID) | RESPIRATORY_TRACT | Status: DC
  Administered 2016-06-11 – 2016-06-16 (×11): 0.25 mg via RESPIRATORY_TRACT
  Filled 2016-06-11 (×11): qty 2

## 2016-06-11 MED ORDER — LORATADINE 10 MG PO TABS
10.0000 mg | ORAL_TABLET | Freq: Every day | ORAL | Status: DC
  Administered 2016-06-11 – 2016-06-16 (×6): 10 mg via ORAL
  Filled 2016-06-11 (×6): qty 1

## 2016-06-11 MED ORDER — IPRATROPIUM-ALBUTEROL 0.5-2.5 (3) MG/3ML IN SOLN
3.0000 mL | RESPIRATORY_TRACT | Status: DC
  Administered 2016-06-11 (×2): 3 mL via RESPIRATORY_TRACT
  Filled 2016-06-11 (×2): qty 3

## 2016-06-11 MED ORDER — LAMOTRIGINE 100 MG PO TABS
200.0000 mg | ORAL_TABLET | Freq: Two times a day (BID) | ORAL | Status: DC
  Administered 2016-06-11 – 2016-06-16 (×12): 200 mg via ORAL
  Filled 2016-06-11 (×12): qty 2

## 2016-06-11 MED ORDER — IPRATROPIUM-ALBUTEROL 0.5-2.5 (3) MG/3ML IN SOLN
3.0000 mL | RESPIRATORY_TRACT | Status: DC | PRN
  Administered 2016-06-13: 3 mL via RESPIRATORY_TRACT
  Filled 2016-06-11: qty 3

## 2016-06-11 MED ORDER — ALBUTEROL SULFATE (2.5 MG/3ML) 0.083% IN NEBU: 2.5000 mg | INHALATION_SOLUTION | RESPIRATORY_TRACT | Status: DC | PRN

## 2016-06-11 MED ORDER — IPRATROPIUM-ALBUTEROL 0.5-2.5 (3) MG/3ML IN SOLN
3.0000 mL | Freq: Four times a day (QID) | RESPIRATORY_TRACT | Status: DC
  Administered 2016-06-11 – 2016-06-15 (×15): 3 mL via RESPIRATORY_TRACT
  Filled 2016-06-11 (×16): qty 3

## 2016-06-11 MED ORDER — LEVOFLOXACIN IN D5W 750 MG/150ML IV SOLN
750.0000 mg | INTRAVENOUS | Status: AC
  Administered 2016-06-11 – 2016-06-16 (×5): 750 mg via INTRAVENOUS
  Filled 2016-06-11 (×5): qty 150

## 2016-06-11 MED ORDER — METHYLPREDNISOLONE SODIUM SUCC 40 MG IJ SOLR
40.0000 mg | Freq: Every day | INTRAMUSCULAR | Status: DC
  Administered 2016-06-11 (×2): 40 mg via INTRAVENOUS
  Filled 2016-06-11 (×2): qty 1

## 2016-06-11 MED ORDER — CLOPIDOGREL BISULFATE 75 MG PO TABS
75.0000 mg | ORAL_TABLET | Freq: Every day | ORAL | Status: DC
  Administered 2016-06-11 – 2016-06-16 (×6): 75 mg via ORAL
  Filled 2016-06-11 (×6): qty 1

## 2016-06-11 MED ORDER — LORAZEPAM 1 MG PO TABS
1.0000 mg | ORAL_TABLET | Freq: Two times a day (BID) | ORAL | Status: DC
  Administered 2016-06-11 – 2016-06-16 (×12): 1 mg via ORAL
  Filled 2016-06-11 (×12): qty 1

## 2016-06-11 MED ORDER — FUROSEMIDE 10 MG/ML IJ SOLN
60.0000 mg | Freq: Once | INTRAMUSCULAR | Status: AC
  Administered 2016-06-11: 60 mg via INTRAVENOUS
  Filled 2016-06-11: qty 6

## 2016-06-11 MED ORDER — ONDANSETRON HCL 4 MG PO TABS: 4.0000 mg | ORAL_TABLET | Freq: Four times a day (QID) | ORAL | Status: DC | PRN

## 2016-06-11 MED ORDER — IOPAMIDOL (ISOVUE-300) INJECTION 61%
INTRAVENOUS | Status: AC
  Administered 2016-06-11: 75 mL
  Filled 2016-06-11: qty 75

## 2016-06-11 MED ORDER — VITAMIN D 1000 UNITS PO TABS
1000.0000 [IU] | ORAL_TABLET | Freq: Every day | ORAL | Status: DC
  Administered 2016-06-11 – 2016-06-16 (×6): 1000 [IU] via ORAL
  Filled 2016-06-11 (×6): qty 1

## 2016-06-11 MED ORDER — DONEPEZIL HCL 10 MG PO TABS
10.0000 mg | ORAL_TABLET | Freq: Every day | ORAL | Status: DC
  Administered 2016-06-11 – 2016-06-16 (×5): 10 mg via ORAL
  Filled 2016-06-11 (×5): qty 1

## 2016-06-11 MED ORDER — QUETIAPINE FUMARATE 25 MG PO TABS
150.0000 mg | ORAL_TABLET | Freq: Two times a day (BID) | ORAL | Status: DC
  Administered 2016-06-11 – 2016-06-16 (×12): 150 mg via ORAL
  Filled 2016-06-11 (×12): qty 6

## 2016-06-11 MED ORDER — AMLODIPINE BESYLATE 5 MG PO TABS
5.0000 mg | ORAL_TABLET | Freq: Every day | ORAL | Status: DC
  Administered 2016-06-11: 5 mg via ORAL
  Filled 2016-06-11: qty 1

## 2016-06-11 MED ORDER — ALBUTEROL SULFATE (2.5 MG/3ML) 0.083% IN NEBU: 2.5000 mg | INHALATION_SOLUTION | RESPIRATORY_TRACT | Status: DC

## 2016-06-11 MED ORDER — HYDROCORTISONE NA SUCCINATE PF 250 MG IJ SOLR
200.0000 mg | Freq: Once | INTRAMUSCULAR | Status: AC
  Administered 2016-06-11: 200 mg via INTRAVENOUS
  Filled 2016-06-11: qty 200

## 2016-06-11 MED ORDER — ALBUTEROL SULFATE (2.5 MG/3ML) 0.083% IN NEBU
2.5000 mg | INHALATION_SOLUTION | RESPIRATORY_TRACT | Status: DC | PRN
  Administered 2016-06-12: 2.5 mg via RESPIRATORY_TRACT
  Filled 2016-06-11: qty 3

## 2016-06-11 MED ORDER — FLUTICASONE PROPIONATE 50 MCG/ACT NA SUSP
2.0000 | Freq: Every day | NASAL | Status: DC
  Administered 2016-06-11 – 2016-06-16 (×6): 2 via NASAL
  Filled 2016-06-11: qty 16

## 2016-06-11 MED ORDER — IPRATROPIUM BROMIDE 0.02 % IN SOLN: 0.5000 mg | RESPIRATORY_TRACT | Status: DC

## 2016-06-11 MED ORDER — SODIUM CHLORIDE 0.9 % IV BOLUS (SEPSIS)
1000.0000 mL | Freq: Once | INTRAVENOUS | Status: AC
  Administered 2016-06-11: 500 mL via INTRAVENOUS

## 2016-06-11 MED ORDER — ACETAMINOPHEN 650 MG RE SUPP: 650.0000 mg | Freq: Four times a day (QID) | RECTAL | Status: DC | PRN

## 2016-06-11 MED ORDER — ATORVASTATIN CALCIUM 40 MG PO TABS
40.0000 mg | ORAL_TABLET | Freq: Every day | ORAL | Status: DC
  Administered 2016-06-11 – 2016-06-16 (×6): 40 mg via ORAL
  Filled 2016-06-11 (×6): qty 1

## 2016-06-11 MED ORDER — IOPAMIDOL (ISOVUE-300) INJECTION 61%
INTRAVENOUS | Status: AC
  Administered 2016-06-11: 80 mL
  Filled 2016-06-11: qty 100

## 2016-06-11 MED ORDER — PANTOPRAZOLE SODIUM 40 MG PO TBEC
40.0000 mg | DELAYED_RELEASE_TABLET | Freq: Every day | ORAL | Status: DC
  Administered 2016-06-11 – 2016-06-16 (×6): 40 mg via ORAL
  Filled 2016-06-11 (×6): qty 1

## 2016-06-11 MED ORDER — SODIUM CHLORIDE 0.9 % IV SOLN
INTRAVENOUS | Status: DC
  Administered 2016-06-11 (×2): via INTRAVENOUS

## 2016-06-11 MED ORDER — ACETAMINOPHEN 325 MG PO TABS: 650.0000 mg | ORAL_TABLET | Freq: Four times a day (QID) | ORAL | Status: DC | PRN

## 2016-06-11 MED ORDER — VERAPAMIL HCL ER 120 MG PO TBCR
120.0000 mg | EXTENDED_RELEASE_TABLET | Freq: Every day | ORAL | Status: DC
  Administered 2016-06-11: 120 mg via ORAL
  Filled 2016-06-11 (×2): qty 1

## 2016-06-11 MED ORDER — MONTELUKAST SODIUM 10 MG PO TABS
10.0000 mg | ORAL_TABLET | Freq: Every day | ORAL | Status: DC
  Administered 2016-06-11 – 2016-06-16 (×5): 10 mg via ORAL
  Filled 2016-06-11 (×5): qty 1

## 2016-06-11 MED ORDER — SODIUM CHLORIDE 0.9 % IV BOLUS (SEPSIS)
500.0000 mL | Freq: Once | INTRAVENOUS | Status: AC
  Administered 2016-06-11: 500 mL via INTRAVENOUS

## 2016-06-11 MED ORDER — ENOXAPARIN SODIUM 40 MG/0.4ML ~~LOC~~ SOLN
40.0000 mg | SUBCUTANEOUS | Status: DC
  Administered 2016-06-11 – 2016-06-15 (×5): 40 mg via SUBCUTANEOUS
  Filled 2016-06-11 (×6): qty 0.4

## 2016-06-11 MED ORDER — FERROUS SULFATE 325 (65 FE) MG PO TABS
325.0000 mg | ORAL_TABLET | Freq: Every day | ORAL | Status: DC
  Administered 2016-06-11 – 2016-06-16 (×6): 325 mg via ORAL
  Filled 2016-06-11 (×6): qty 1

## 2016-06-11 NOTE — Progress Notes (Signed)
CRITICAL VALUE STICKER  CRITICAL VALUE: trop 0.03  RECEIVER (on-site recipient of call):  DATE & TIME NOTIFIED: 5:21  MESSENGER (representative from lab):  MD NOTIFIED: [email or web address]  TIME OF NOTIFICATION:  RESPONSE:

## 2016-06-11 NOTE — Progress Notes (Signed)
CRITICAL VALUE STICKER  CRITICAL VALUE: lactic acid 4.4  RECEIVER (on-site recipient of call): Cindy Padilla    DATE & TIME NOTIFIED: 06/11/16 4:20 pm  MESSENGER (representative from lab): Dot LanesNevie Livingston  MD NOTIFIED: [email or web address]  TIME OF NOTIFICATION: 4:23  RESPONSE: Chest CT without contrast.  Rapid response notified and will round on patient. Vitals stable, no pain.

## 2016-06-11 NOTE — Progress Notes (Signed)
Dr. Eilene GhaziKakarandy called and requested that 0300 labs be drawn after the bolus of Normal Saline. Writer called and informed lab.

## 2016-06-11 NOTE — Progress Notes (Signed)
Writer called CT and informed them that Dr. Eilene GhaziKakarandy wants hydrocortisone 4 hours before CT scan and benadryl one hour before scan.. CT scheduled scan for 0900. Will report to oncoming nurse.

## 2016-06-11 NOTE — H&P (Signed)
History and Physical    Cindy URBANSKI Padilla:[id number] DOB: 04-Mar-1941 DOA: 06/10/2016  PCP: Ron Parker, MD  Patient coming from: Independent living facility.  Chief Complaint: Shortness of breath.  HPI: Cindy Padilla is a 76 y.o. female with history of hypertension, asthma/COPD, diastolic CHF and dementia was brought from the independent living facility after patient was having persistent shortness of breath productive cough. Patient states her symptoms started a week ago with sinus symptoms which gradually progress to shortness of breath and wheezing. Has some chest tightness. Her primary care had placed her on medication yesterday which she is yet to start. Due to worsening shortness of breath patient was brought to the ER. In the ER patient was found to be wheezing bilaterally tachypneic febrile and hypoxic. Chest x-ray was showing left upper lobe consolidation. Lactate was elevated with markedly elevated leukocytosis. Patient is being admitted for sepsis from pneumonia. On my exam patient has bilateral expiratory wheeze.  ED Course: Patient was started on fluid bolus. Blood cultures were obtained and started on empiric antibiotics. Influenza PCR is negative. Patient also was given nebulizer for wheezing. EKG was showing normal sinus rhythm.  Review of Systems: As per HPI, rest all negative.   Past Medical History:  Diagnosis Date  . Arthritis   . Bipolar 1 disorder (HCC)   . Bronchitis   . Chronic back pain   . Constipation   . COPD (chronic obstructive pulmonary disease) (HCC)   . Dementia   . Diabetes mellitus   . GERD (gastroesophageal reflux disease)   . Hiatal hernia   . Hypertension   . IBS (irritable bowel syndrome)     Past Surgical History:  Procedure Laterality Date  . gallbladder    . MANDIBLE SURGERY    . PARTIAL HYSTERECTOMY       reports that she quit smoking about 57 years ago. Her smoking use included Cigarettes. She has a 1.00 pack-year  smoking history. She quit smokeless tobacco use about 57 years ago. Her smokeless tobacco use included Snuff. She reports that she does not drink alcohol or use drugs.  Allergies  Allergen Reactions  . Aminoglycosides Other (See Comments)    Unknown reaction (per MAR)  . Aspirin Other (See Comments)    Unknown reaction (per MAR)  . Codeine Other (See Comments)    Unknown reaction (per MAR)  . Ivp Dye [Iodinated Diagnostic Agents] Other (See Comments)    Unknown reaction (per MAR)  . Morphine And Related Other (See Comments)    Unknown reaction (per MAR)  . Neomycin Other (See Comments)    Unknown reaction (per MAR)  . Penicillins Rash  . Phenothiazines Other (See Comments)    Unknown reaction (per MAR)  . Promethazine Other (See Comments)    unknown  . Sulfa Antibiotics Other (See Comments)    Unknown reaction (per MAR)  . Tetracyclines & Related Other (See Comments)    Unknown reaction (per [facility])    Family History  Problem Relation Age of Onset  . Emphysema Mother   . Cancer Other     Prior to Admission medications   Medication Sig Start Date End Date Taking? Authorizing Provider  acetaminophen (TYLENOL) 325 MG tablet Take 650 mg by mouth 3 (three) times daily.   Yes Historical Provider, MD  acetaminophen (TYLENOL) 500 MG tablet Take 500 mg by mouth every 4 (four) hours as needed for headache.   Yes Historical Provider, MD  albuterol (PROVENTIL HFA;VENTOLIN HFA) 108 (90 BASE)  MCG/ACT inhaler Inhale 2 puffs into the lungs every 4 (four) hours as needed for wheezing or shortness of breath.   Yes Historical Provider, MD  alum & mag hydroxide-simeth (GERI-LANTA) 200-200-20 MG/5ML suspension Take 30 mLs by mouth See admin instructions. Give 1 hour after meals and at bedtime as needed for indigestion   Yes Historical Provider, MD  amLODipine (NORVASC) 5 MG tablet Take 5 mg by mouth daily.   Yes Historical Provider, MD  antiseptic oral rinse (BIOTENE) LIQD 15 mLs by Mouth Rinse route  at bedtime. Swish and spit   Yes Historical Provider, MD  atorvastatin (LIPITOR) 40 MG tablet Take 40 mg by mouth daily.    Yes Historical Provider, MD  budesonide-formoterol (SYMBICORT) 160-4.5 MCG/ACT inhaler Inhale 2 puffs into the lungs 2 (two) times daily. Rinse mouth after use   Yes Historical Provider, MD  cholecalciferol (VITAMIN D) 1000 UNITS tablet Take 1,000 Units by mouth daily.    Yes Historical Provider, MD  clopidogrel (PLAVIX) 75 MG tablet Take 75 mg by mouth daily.    Yes Historical Provider, MD  donepezil (ARICEPT) 10 MG tablet Take 10 mg by mouth at bedtime.    Yes Historical Provider, MD  esomeprazole (NEXIUM) 20 MG capsule Take 20 mg by mouth daily at 12 noon.   Yes Historical Provider, MD  ferrous sulfate 325 (65 FE) MG tablet Take 325 mg by mouth daily with breakfast.   Yes Historical Provider, MD  fexofenadine (ALLEGRA) 180 MG tablet Take 180 mg by mouth daily.   Yes Historical Provider, MD  fluticasone (FLONASE) 50 MCG/ACT nasal spray Place 2 sprays into both nostrils daily.    Yes Historical Provider, MD  furosemide (LASIX) 20 MG tablet Take 20 mg by mouth daily with breakfast.   Yes Historical Provider, MD  guaiFENesin (ROBITUSSIN) 100 MG/5ML liquid Take 10 mLs (200 mg total) by mouth 3 (three) times daily as needed for cough. 01/24/14  Yes Fayrene Helper, PA-C  ipratropium (ATROVENT) 0.02 % nebulizer solution Take 2.5 mLs (0.5 mg total) by nebulization every 6 (six) hours as needed for wheezing or shortness of breath. 04/08/15  Yes Shari Upstill, PA-C  ipratropium-albuterol (DUONEB) 0.5-2.5 (3) MG/3ML SOLN Take 3 mLs by nebulization every 6 (six) hours.    Yes Historical Provider, MD  lamoTRIgine (LAMICTAL) 200 MG tablet Take 200 mg by mouth 2 (two) times daily.   Yes Historical Provider, MD  lidocaine (ASPERCREME W/LIDOCAINE) 4 % cream Apply 1 application topically 3 (three) times daily as needed (pain).   Yes Historical Provider, MD  loperamide (IMODIUM) 2 MG capsule Take 2  mg by mouth as needed for diarrhea or loose stools. Do not exceed 8 doses in 24 hours   Yes Historical Provider, MD  LORazepam (ATIVAN) 1 MG tablet Take 1 mg by mouth every 12 (twelve) hours.   Yes Historical Provider, MD  magnesium hydroxide (MILK OF MAGNESIA) 400 MG/5ML suspension Take 30 mLs by mouth at bedtime as needed for mild constipation.   Yes Historical Provider, MD  magnesium oxide (MAG-OX) 400 MG tablet Take 400 mg by mouth daily.    Yes Historical Provider, MD  methylPREDNISolone (MEDROL DOSEPAK) 4 MG TBPK tablet Take 4-32 mg by mouth daily.   Yes Historical Provider, MD  montelukast (SINGULAIR) 10 MG tablet Take 10 mg by mouth at bedtime.   Yes Historical Provider, MD  phenylephrine-shark liver oil-mineral oil-petrolatum (PREPARATION H) 0.25-3-14-71.9 % rectal ointment Place 1 application rectally 2 (two) times daily as needed for  hemorrhoids.   Yes Historical Provider, MD  QUEtiapine (SEROQUEL) 100 MG tablet Take 150 mg by mouth 2 (two) times daily. Hold for sedation   Yes Historical Provider, MD  ranitidine (ZANTAC) 150 MG tablet Take 150 mg by mouth 2 (two) times daily.   Yes Historical Provider, MD  senna-docusate (SENOKOT-S) 8.6-50 MG per tablet Take 1 tablet by mouth 2 (two) times daily.   Yes Historical Provider, MD  triamcinolone cream (KENALOG) 0.1 % Apply 1 application topically daily as needed (irritation).    Yes Historical Provider, MD  valsartan (DIOVAN) 320 MG tablet Take 1 tablet (320 mg total) by mouth daily. 04/16/13  Yes Nyoka Cowden, MD  verapamil (CALAN-SR) 120 MG CR tablet Take 120 mg by mouth daily.    Yes Historical Provider, MD  Vitamins A & D (VITAMIN A & D) ointment Apply 1 application topically every 6 (six) hours as needed (rectal pain).    Yes Historical Provider, MD    Physical Exam: Vitals:   06/10/16 2245 06/10/16 2315 06/11/16 0015 06/11/16 0219  BP:  143/98 158/82 (!) 168/68  Pulse:  95 95 88  Resp:   18 20  Temp: 99.6 F (37.6 C)   98 F  (36.7 C)  TempSrc: Oral   Oral  SpO2:  98% 92% 97%  Weight:    84.7 kg (186 lb 12.8 oz)  Height:    5' (1.524 m)      Constitutional: Moderately built and nourished. Vitals:   06/10/16 2245 06/10/16 2315 06/11/16 0015 06/11/16 0219  BP:  143/98 158/82 (!) 168/68  Pulse:  95 95 88  Resp:   18 20  Temp: 99.6 F (37.6 C)   98 F (36.7 C)  TempSrc: Oral   Oral  SpO2:  98% 92% 97%  Weight:    84.7 kg (186 lb 12.8 oz)  Height:    5' (1.524 m)   Eyes: Anicteric no pallor. ENMT: No discharge from the ears eyes nose or mouth. Neck: No JVD appreciated no mass felt. Respiratory: Bilateral expiratory wheezes and no crepitations. Cardiovascular: S1 and S2 heard tachycardic. Abdomen: Soft nontender bowel sounds present. Has ventral hernia appears nonobstructed. Musculoskeletal: No edema. Skin: Chronic bilateral lower extremity rash. Neurologic: Alert awake oriented to time place and person. Moves all extremities. Psychiatric: Appears normal. Normal affect.   Labs on Admission: I have personally reviewed following labs and imaging studies  CBC:  Recent Labs Lab 06/10/16 2130  WBC 27.4*  NEUTROABS 21.6*  HGB 12.4  HCT 37.0  MCV 91.1  PLT 226   Basic Metabolic Panel:  Recent Labs Lab 06/10/16 2130  NA 133*  K 4.3  CL 94*  CO2 26  GLUCOSE 109*  BUN 11  CREATININE 0.83  CALCIUM 9.0   GFR: Estimated Creatinine Clearance: 55.7 mL/min (by C-G formula based on SCr of 0.83 mg/dL). Liver Function Tests: No results for input(s): AST, ALT, ALKPHOS, BILITOT, PROT, ALBUMIN in the last 168 hours. No results for input(s): LIPASE, AMYLASE in the last 168 hours. No results for input(s): AMMONIA in the last 168 hours. Coagulation Profile: No results for input(s): INR, PROTIME in the last 168 hours. Cardiac Enzymes: No results for input(s): CKTOTAL, CKMB, CKMBINDEX, TROPONINI in the last 168 hours. BNP (last 3 results) No results for input(s): PROBNP in the last 8760  hours. HbA1C: No results for input(s): HGBA1C in the last 72 hours. CBG: No results for input(s): GLUCAP in the last 168 hours. Lipid Profile: No  results for input(s): CHOL, HDL, LDLCALC, TRIG, CHOLHDL, LDLDIRECT in the last 72 hours. Thyroid Function Tests: No results for input(s): TSH, T4TOTAL, FREET4, T3FREE, THYROIDAB in the last 72 hours. Anemia Panel: No results for input(s): VITAMINB12, FOLATE, FERRITIN, TIBC, IRON, RETICCTPCT in the last 72 hours. Urine analysis:    Component Value Date/Time   COLORURINE STRAW (A) 06/11/2016 0004   APPEARANCEUR CLEAR 06/11/2016 0004   LABSPEC 1.006 06/11/2016 0004   PHURINE 8.0 06/11/2016 0004   GLUCOSEU NEGATIVE 06/11/2016 0004   HGBUR NEGATIVE 06/11/2016 0004   BILIRUBINUR NEGATIVE 06/11/2016 0004   KETONESUR NEGATIVE 06/11/2016 0004   PROTEINUR NEGATIVE 06/11/2016 0004   UROBILINOGEN 0.2 09/19/2014 1633   NITRITE NEGATIVE 06/11/2016 0004   LEUKOCYTESUR SMALL (A) 06/11/2016 0004   Sepsis Labs: @LABRCNTIP (procalcitonin:4,lacticidven:4) )No results found for this or any previous visit (from the past 240 hour(s)).   Radiological Exams on Admission: Dg Chest 2 View  Result Date: 06/10/2016 CLINICAL DATA:  Cough, congestion, fever, wheezing for 1 week. History of COPD, hypertension. EXAM: CHEST  2 VIEW COMPARISON:  CT chest March 01, 2016 FINDINGS: Approximate 4 cm masslike consolidation LEFT upper lobe. The cardiac silhouette is mildly enlarged. Mildly calcified aortic knob. Increased lung volumes. Mildly elevated RIGHT hemidiaphragm. No pleural effusion. No pneumothorax. Vagal stimulator LEFT chest. Osseous structures are nonsuspicious. IMPRESSION: Masslike consolidation LEFT upper lobe, recommend CT chest with contrast. Mild cardiomegaly. Electronically Signed   By: Awilda Metroourtnay  Bloomer M.D.   On: 06/10/2016 22:21    EKG: Independently reviewed. Normal sinus rhythm with nonspecific T-wave changes.  Assessment/Plan Principal Problem:    Sepsis (HCC) Active Problems:   HTN (hypertension)   Intrinsic asthma   CAP (community acquired pneumonia)    1. Sepsis secondary to pneumonia - influenza PCR is negative. Check urine for Legionella and strep antigen, follow sputum cultures and blood cultures. Patient has been placed on Levaquin. Continue with hydration and follow lactate levels procalcitonin levels. 2. Left upper lobe opacity - CT scan of the chest with contrast was recommended by radiologist. Since patient has allergies to IV dye, I have discussed with radiology and placed patient on hydrocortisone and Benadryl prior to getting CAT scan. 3. Asthma/COPD exacerbation - probably precipitated by pneumonia. Patient has been placed on IV Solu-Medrol Pulmicort and DuoNeb. Patient's chest tightness probably from wheezing but will cycle cardiac markers. 4. Hypertension on amlodipine, Diovan and verapamil. 5. History of dementia on Aricept. 6. History of diastolic CHF last EF measured in 2014 was 55-60% - currently holding off Lasix due to sepsis. Closely follow respiratory status. 7. Chronic lower extremity rash probably from chronic cellulitis. 8. Ventral hernia appears nonobstructed.   DVT prophylaxis: Lovenox. Code Status: DO NOT RESUSCITATE.  Family Communication: Discussed with patient.  Disposition Plan: Back to facility.  Consults called: None.  Admission status: Inpatient.    Eduard ClosKAKRAKANDY,Letty Salvi N. MD [facility] Pager [phone].  If 7PM-7AM, please contact night-coverage [email or web address] Password [email or web address]  06/11/2016, 3:04 AM

## 2016-06-11 NOTE — Progress Notes (Signed)
PROGRESS NOTE                                                                                                                                                                                                             Patient Demographics:    Cindy Padilla, is a 76 y.o. female, DOB - [birth year], ZOX:[id number]  Admit date - 06/10/2016   Admitting Physician Eduard Clos, MD  Outpatient Primary MD for the patient is Ron Parker, MD  LOS - 1  Outpatient Specialists: none  Chief Complaint  Patient presents with  . Shortness of Breath       Brief Narrative  76 year old female with history of hypertension, asthma/COPD, diastolic CHF and dementia who is a resident of independent living was brought to the ED with persistent shortness of breath and productive cough. Symptoms started a week back with sinus symptoms gradually progressing to dyspnea and wheezing associated with some chest tightness. In the ED shows found to be wheezing, tachypneic, febrile 102F and hypoxic. Neck slight chest x-ray showing left upper lobe consolidation with elevated lactate and significant leukocytosis (31K) Patient admitted for sepsis secondary to sepsis with pneumonia.    Subjective:   Complains of cough and chest tightness.  Assessment  & Plan :    Principal Problem:   Sepsis (HCC) Secondary to left upper lobe pneumonia, strep antigen positive. Sepsis pathway initiated on admission received 2.5 L IV normal saline bolus and maintenance fluid. Empiric Levaquin. Follow blood culture and Legionella antigen. Persistently elevated lactic acid. Monitor with IV fluids. CT chest negative for abscess. Needs follow-up chest x-ray in 3-4 weeks for clearing.    Active Problems: Essential hypertension Stable. Continue home medications  Chronic asthma No active symptoms. At when necessary nebulizer.  Chronic diastolic CHF Has  bilateral pitting edema which seems to be chronic. Will hold her amlodipine. Monitor patient getting aggressive IV fluids.  Chronic liver cysts and ventral hernia Seems to be unchanged study.   Code Status : DO NOT RESUSCITATE  Family Communication  : none at  bedside  Disposition Plan  : Pending hospital course  Barriers For Discharge :   Consults  :  None  Procedures  :  CT chest   DVT Prophylaxis  :  Lovenox -  Lab Results  Component Value Date   PLT 189 06/11/2016  Antibiotics  :    Anti-infectives    Start     Dose/Rate Route Frequency Ordered Stop   06/11/16 2200  levofloxacin (LEVAQUIN) IVPB 750 mg     750 mg 100 mL/hr over 90 Minutes Intravenous Every 24 hours 06/11/16 0302 06/16/16 2159   06/10/16 2230  levofloxacin (LEVAQUIN) IVPB 750 mg     750 mg 100 mL/hr over 90 Minutes Intravenous  Once 06/10/16 2218 06/11/16 0044        Objective:   Vitals:   06/11/16 0219 06/11/16 0345 06/11/16 0558 06/11/16 0802  BP: (!) 168/68  (!) 129/50 138/61  Pulse: 88 91 79   Resp: 20 20 (!) 22   Temp: 98 F (36.7 C)     TempSrc: Oral     SpO2: 97% 97% 92%   Weight: 84.7 kg (186 lb 12.8 oz)     Height: 5' (1.524 m)       Wt Readings from Last 3 Encounters:  06/11/16 84.7 kg (186 lb 12.8 oz)  03/01/16 83.5 kg (184 lb)  04/11/15 83.5 kg (184 lb)     Intake/Output Summary (Last 24 hours) at 06/11/16 1310 Last data filed at 06/11/16 1229  Gross per 24 hour  Intake              580 ml  Output              600 ml  Net              -20 ml     Physical Exam  IHK:[id number]:Elderly obese female appears fatigued  HEENT: no pallor, moist mucosa, supple neck Chest:Coarse Breath sounds over lung, scattered rhonchi  CVS: N S1&S2, no murmurs, rubs or gallop GI: soft,palpable ventral hernia, nontender, bowel sounds present  Musculoskeletal: warm,1+ pitting edema bilaterally  LOV:FIEPPCNS:Alert and oriented   Data Review:    CBC  Recent Labs Lab 06/10/16 2130 06/11/16 0543    WBC 27.4* 31.3*  HGB 12.4 11.4*  HCT 37.0 34.2*  PLT 226 189  MCV 91.1 92.2  MCH 30.5 30.7  MCHC 33.5 33.3  RDW 14.6 15.1  LYMPHSABS 3.6 1.3  MONOABS 2.2* 0.3  EOSABS 0.0 0.0  BASOSABS 0.0 0.0    Chemistries   Recent Labs Lab 06/10/16 2130 06/11/16 0543  NA 133* 134*  K 4.3 4.5  CL 94* 100*  CO2 26 22  GLUCOSE 109* 169*  BUN 11 10  CREATININE 0.83 0.75  CALCIUM 9.0 8.3*  AST  --  18  ALT  --  17  ALKPHOS  --  75  BILITOT  --  0.4   ------------------------------------------------------------------------------------------------------------------ No results for input(s): CHOL, HDL, LDLCALC, TRIG, CHOLHDL, LDLDIRECT in the last 72 hours.  Lab Results  Component Value Date   HGBA1C 6.6 (H) 02/21/2013   ------------------------------------------------------------------------------------------------------------------ No results for input(s): TSH, T4TOTAL, T3FREE, THYROIDAB in the last 72 hours.  Invalid input(s): FREET3 ------------------------------------------------------------------------------------------------------------------ No results for input(s): VITAMINB12, FOLATE, FERRITIN, TIBC, IRON, RETICCTPCT in the last 72 hours.  Coagulation profile  Recent Labs Lab 06/11/16 0543  INR 1.16    No results for input(s): DDIMER in the last 72 hours.  Cardiac Enzymes  Recent Labs Lab 06/11/16 0543 06/11/16 1117  TROPONINI <0.03 <0.03   ------------------------------------------------------------------------------------------------------------------    Component Value Date/Time   BNP 70.1 06/10/2016 2138    Inpatient Medications  Scheduled Meds: . atorvastatin  40 mg Oral Daily  . budesonide (PULMICORT) nebulizer solution  0.25 mg Nebulization BID  .  cholecalciferol  1,000 Units Oral Daily  . clopidogrel  75 mg Oral Daily  . donepezil  10 mg Oral QHS  . enoxaparin (LOVENOX) injection  40 mg Subcutaneous Q24H  . ferrous sulfate  325 mg Oral Q  breakfast  . fluticasone  2 spray Each Nare Daily  . guaiFENesin  600 mg Oral BID  . ipratropium-albuterol  3 mL Nebulization Q6H  . irbesartan  300 mg Oral Daily  . lamoTRIgine  200 mg Oral BID  . levofloxacin (LEVAQUIN) IV  750 mg Intravenous Q24H  . loratadine  10 mg Oral Daily  . LORazepam  1 mg Oral Q12H  . magnesium oxide  400 mg Oral Daily  . methylPREDNISolone (SOLU-MEDROL) injection  40 mg Intravenous Daily  . montelukast  10 mg Oral QHS  . pantoprazole  40 mg Oral Daily  . QUEtiapine  150 mg Oral BID  . senna-docusate  1 tablet Oral BID  . sodium chloride  1,000 mL Intravenous Once  . verapamil  120 mg Oral Daily   Continuous Infusions: . sodium chloride 125 mL/hr at 06/11/16 0909   PRN Meds:.acetaminophen **OR** acetaminophen, guaiFENesin, ipratropium-albuterol, ondansetron **OR** ondansetron (ZOFRAN) IV  Micro Results Recent Results (from the past 240 hour(s))  MRSA PCR Screening     Status: None   Collection Time: 06/11/16  2:30 AM  Result Value Ref Range Status   MRSA by PCR NEGATIVE NEGATIVE Final    Comment:        The GeneXpert MRSA Assay (FDA approved for NASAL specimens only), is one component of a comprehensive MRSA colonization surveillance program. It is not intended to diagnose MRSA infection nor to guide or monitor treatment for MRSA infections.     Radiology Reports Dg Chest 2 View  Result Date: 06/10/2016 CLINICAL DATA:  Cough, congestion, fever, wheezing for 1 week. History of COPD, hypertension. EXAM: CHEST  2 VIEW COMPARISON:  CT chest March 01, 2016 FINDINGS: Approximate 4 cm masslike consolidation LEFT upper lobe. The cardiac silhouette is mildly enlarged. Mildly calcified aortic knob. Increased lung volumes. Mildly elevated RIGHT hemidiaphragm. No pleural effusion. No pneumothorax. Vagal stimulator LEFT chest. Osseous structures are nonsuspicious. IMPRESSION: Masslike consolidation LEFT upper lobe, recommend CT chest with contrast. Mild  cardiomegaly. Electronically Signed   By: Awilda Metro M.D.   On: 06/10/2016 22:21   Ct Chest W Contrast  Result Date: 06/11/2016 CLINICAL DATA:  Shortness of breath with cough and weakness. Abnormal chest radiograph EXAM: CT CHEST WITH CONTRAST TECHNIQUE: Multidetector CT imaging of the chest was performed during intravenous contrast administration. CONTRAST:  75mL ISOVUE-300 IOPAMIDOL (ISOVUE-300) INJECTION 61% COMPARISON:  Chest CT March 01, 2016 and chest radiograph June 10, 2016 FINDINGS: Cardiovascular: There is no thoracic aortic aneurysm or dissection. There are scattered foci of calcification visualized great vessels as well as scattered foci of calcification in the thoracic aorta. There are scattered foci of coronary artery calcification. Pericardium is not appreciably thickened. No major vessel pulmonary embolus evident. Mediastinum/Nodes: Visualized thyroid is within normal limits. There are a few subcentimeter mediastinal lymph nodes. There is a stable mildly prominent sub- carinal lymph node measuring 1.6 x 1.1 cm. No new lymph node prominence evident. Lungs/Pleura: There is focal consolidation in the posterior segment of the left upper lobe. There are air bronchograms extending throughout this area of consolidation. A well-defined underlying mass lesion is not evident in this area. Note that no mass lesion was seen in this area on CT examination approximately 3 months prior. There  is patchy atelectatic change in the lung bases. There is slight scarring in the extreme lung apices. There is no pleural thickening or pleural effusion. Upper Abdomen: In the visualized upper abdomen, gallbladder is absent. There is stable prominence of the visualize common bile duct, measuring 12 mm. There are stable cystic areas in the liver, largest in the medial segment left lobe measuring 4.1 x 3.8 cm. There is atherosclerotic calcification in the aorta. 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 There is bowel extending into a ventral hernia, incompletely visualized. The visualized bowel in this area appears unremarkable. Musculoskeletal: There are no blastic or lytic bone lesions. There is stable eventration of the right hemidiaphragm. IMPRESSION: Airspace consolidation containing air bronchograms in the posterior segment of the left upper lobe, felt to be consistent with pneumonia. No mass lesion is seen in this area. Follow-up imaging to clearing in this circumstance is advised to exclude the possibility of a small underlying lesion. Stable sub- carinal lymph node prominence. No new lymph node prominence compared to prior CT. Scattered foci of atherosclerotic calcification as well as foci of coronary artery calcification. Small calcified splenic artery aneurysm, not felt to be of clinical significance in this age group. Cystic areas in the liver, stable. Incomplete visualization of ventral hernia. Bowel which is visualized in this region appears unremarkable. Gallbladder absent. Visualized common bile duct prominent but stable. Electronically Signed   By: Bretta Bang III M.D.   On: 06/11/2016 11:22    Time Spent in minutes  25   Eddie North M.D on 06/11/2016 at 1:10 PM  Between 7am to 7pm - Pager - [phone]  After 7pm go to [email or web address] - password [facility]  [facility] -  Office  [phone]

## 2016-06-11 NOTE — Progress Notes (Signed)
12:35 patient assessed after of bolus, coarse crackles and expiratory wheezes noted, provider stopped fluids and ordered lasix VI. Will continue to monitor.

## 2016-06-11 NOTE — Progress Notes (Signed)
CRITICAL VALUE STICKER  CRITICAL VALUE: lactic acid 3.3  RECEIVER (on-site recipient of call): Hristopher Missildine  DATE & TIME NOTIFIED: 06/11/16 @ 12:20 pm  MESSENGER (representative from lab): Idelle LeechSamantha Lenard   MD NOTIFIED: Dhungel  TIME OF NOTIFICATION: 12:23 pm  RESPONSE:

## 2016-06-11 NOTE — Progress Notes (Signed)
[place];  Pt arrived to unit at 0115 via stretcher.

## 2016-06-12 DIAGNOSIS — J45909 Unspecified asthma, uncomplicated: Secondary | ICD-10-CM

## 2016-06-12 DIAGNOSIS — J189 Pneumonia, unspecified organism: Secondary | ICD-10-CM

## 2016-06-12 DIAGNOSIS — A403 Sepsis due to Streptococcus pneumoniae: Secondary | ICD-10-CM

## 2016-06-12 LAB — BASIC METABOLIC PANEL
ANION GAP: 8 (ref 5–15)
BUN: 13 mg/dL (ref 6–20)
CHLORIDE: 103 mmol/L (ref 101–111)
CO2: 25 mmol/L (ref 22–32)
Calcium: 8.6 mg/dL — ABNORMAL LOW (ref 8.9–10.3)
Creatinine, Ser: 0.71 mg/dL (ref 0.44–1.00)
GFR calc Af Amer: >60 mL/min (ref >60)
GLUCOSE: 132 mg/dL — AB (ref 65–99)
POTASSIUM: 3.6 mmol/L (ref 3.5–5.1)
Sodium: 136 mmol/L (ref 135–145)

## 2016-06-12 LAB — URINE CULTURE

## 2016-06-12 LAB — CBC
HCT: 31 % — ABNORMAL LOW (ref 36.0–46.0)
HEMOGLOBIN: 10.1 g/dL — AB (ref 12.0–15.0)
MCH: 30.1 pg (ref 26.0–34.0)
MCHC: 32.6 g/dL (ref 30.0–36.0)
MCV: 92.3 fL (ref 78.0–100.0)
Platelets: 206 10*3/uL (ref 150–400)
RBC: 3.36 MIL/uL — AB (ref 3.87–5.11)
RDW: 15.4 % (ref 11.5–15.5)
WBC: 19.8 10*3/uL — AB (ref 4.0–10.5)

## 2016-06-12 LAB — LACTIC ACID, PLASMA
LACTIC ACID, VENOUS: 1.9 mmol/L (ref 0.5–1.9)
Lactic Acid, Venous: 2.3 mmol/L (ref 0.5–1.9)

## 2016-06-12 MED ORDER — PREDNISONE 20 MG PO TABS
40.0000 mg | ORAL_TABLET | Freq: Every day | ORAL | Status: DC
  Administered 2016-06-13 – 2016-06-16 (×4): 40 mg via ORAL
  Filled 2016-06-12 (×4): qty 2

## 2016-06-12 MED ORDER — BIOTENE DRY MOUTH MT LIQD: 15.0000 mL | OROMUCOSAL | Status: DC | PRN

## 2016-06-12 MED ORDER — LABETALOL HCL 100 MG PO TABS
100.0000 mg | ORAL_TABLET | Freq: Two times a day (BID) | ORAL | Status: DC
  Administered 2016-06-12 – 2016-06-13 (×4): 100 mg via ORAL
  Filled 2016-06-12 (×5): qty 1

## 2016-06-12 NOTE — Progress Notes (Signed)
PROGRESS NOTE    Cindy Padilla   WUJ:[id number]  DOB: 04-23-1941  DOA: 06/10/2016 PCP: Ron Parker, MD   Brief Narrative:  76 year old female with history of hypertension, asthma, diastolic CHF and dementia who is a resident of independent living was brought to the ED with persistent shortness of breath and productive cough. Symptoms started a week back with sinus symptoms gradually progressing to dyspnea and wheezing associated with some chest tightness. In the ED shows found to be wheezing, tachypneic, febrile 102F and hypoxic. Neck slight chest x-ray showing left upper lobe consolidation with elevated lactate and significant leukocytosis (31K) Patient admitted for sepsis with pneumonia.  Subjective: Continues to have cough but not able to cough up mucous  Assessment & Plan:   Principal Problem:  Severe Sepsis/ CAP (community acquired pneumonia) with underlying asthma - LUL- positive strep antigen - Levaquin - follow Lactic acid level - leukocytosis improving - add flutter valve and chest PT due to severe congestion - add Prednisone for wheezing today  Active Problems: Essential hypertension Stable.  Continue Diovan  - noted to be on 2 CCB - will stop both Norvasc and Verapamil due to severe pedal edema- replace with Labetalol - hold Lasix due to dehydration  Chronic diastolic CHF - grade 1 per last ECHO in 9/14  Mildly elevated Troponin - one troponin out of 3 noted to be elevated at 0.04 - likely due to respiratory issues - will not order further work up  B/l pitting edema -  seems to be chronic- noted to be on both Norvasc and Verapamil- will stop both and replace with Labetalol    Chronic liver cysts and ventral hernia Seems to be unchanged   Dementia - Aricept, Seroquel,  Ativan BID     DVT prophylaxis: Lovenox Code Status: DNR Family Communication:  Disposition Plan: return to SNF when stable Consultants:    Procedures:     Antimicrobials:  Anti-infectives    Start     Dose/Rate Route Frequency Ordered Stop   06/11/16 2200  levofloxacin (LEVAQUIN) IVPB 750 mg     750 mg 100 mL/hr over 90 Minutes Intravenous Every 24 hours 06/11/16 0302 06/16/16 2159   06/10/16 2230  levofloxacin (LEVAQUIN) IVPB 750 mg     750 mg 100 mL/hr over 90 Minutes Intravenous  Once 06/10/16 2218 06/11/16 0044       Objective: Vitals:   06/12/16 0342 06/12/16 0533 06/12/16 0601 06/12/16 0710  BP:   (!) 146/47   Pulse:   74   Resp:   20   Temp:   97.8 F (36.6 C)   TempSrc:   Oral   SpO2: 95%  95% 95%  Weight:  86.4 kg (190 lb 8 oz)    Height:        Intake/Output Summary (Last 24 hours) at 06/12/16 0848 Last data filed at 06/12/16 0532  Gross per 24 hour  Intake              800 ml  Output             1925 ml  Net            -1125 ml   Filed Weights   06/10/16 2124 06/11/16 0219 06/12/16 0533  Weight: 83.5 kg (184 lb) 84.7 kg (186 lb 12.8 oz) 86.4 kg (190 lb 8 oz)    Examination: General exam: Appears comfortable  HEENT: PERRLA, oral mucosa moist, no sclera icterus or thrush Respiratory system: + wheezing and  rhonchi, on room air. Respiratory effort normal. Cardiovascular system: S1 & S2 heard, RRR.  No murmurs  Gastrointestinal system: Abdomen soft, non-tender, nondistended. Normal bowel sound. No organomegaly Central nervous system: Alert and oriented. No focal neurological deficits. Extremities: No cyanosis, clubbing - 2 + edema Skin: No rashes or ulcers Psychiatry:  Mood & affect appropriate.     Data Reviewed: I have personally reviewed following labs and imaging studies  CBC:  Recent Labs Lab 06/10/16 2130 06/11/16 0543 06/11/16 1836 06/12/16 0558  WBC 27.4* 31.3* 25.2* 19.8*  NEUTROABS 21.6* 29.7*  --   --   HGB 12.4 11.4* 10.7* 10.1*  HCT 37.0 34.2* 32.7* 31.0*  MCV 91.1 92.2 92.1 92.3  PLT 226 189 219 206   Basic Metabolic Panel:  Recent Labs Lab 06/10/16 2130 06/11/16 0543  06/12/16 0558  NA 133* 134* 136  K 4.3 4.5 3.6  CL 94* 100* 103  CO2 26 22 25   GLUCOSE 109* 169* 132*  BUN 11 10 13   CREATININE 0.83 0.75 0.71  CALCIUM 9.0 8.3* 8.6*   GFR: Estimated Creatinine Clearance: 58.5 mL/min (by C-G formula based on SCr of 0.71 mg/dL). Liver Function Tests:  Recent Labs Lab 06/11/16 0543  AST 18  ALT 17  ALKPHOS 75  BILITOT 0.4  PROT 6.4*  ALBUMIN 3.2*   No results for input(s): LIPASE, AMYLASE in the last 168 hours. No results for input(s): AMMONIA in the last 168 hours. Coagulation Profile:  Recent Labs Lab 06/11/16 0543  INR 1.16   Cardiac Enzymes:  Recent Labs Lab 06/11/16 0543 06/11/16 1117 06/11/16 1553  TROPONINI <0.03 <0.03 0.04*   BNP (last 3 results) No results for input(s): PROBNP in the last 8760 hours. HbA1C: No results for input(s): HGBA1C in the last 72 hours. CBG: No results for input(s): GLUCAP in the last 168 hours. Lipid Profile: No results for input(s): CHOL, HDL, LDLCALC, TRIG, CHOLHDL, LDLDIRECT in the last 72 hours. Thyroid Function Tests: No results for input(s): TSH, T4TOTAL, FREET4, T3FREE, THYROIDAB in the last 72 hours. Anemia Panel: No results for input(s): VITAMINB12, FOLATE, FERRITIN, TIBC, IRON, RETICCTPCT in the last 72 hours. Urine analysis:    Component Value Date/Time   COLORURINE STRAW (A) 06/11/2016 0004   APPEARANCEUR CLEAR 06/11/2016 0004   LABSPEC 1.006 06/11/2016 0004   PHURINE 8.0 06/11/2016 0004   GLUCOSEU NEGATIVE 06/11/2016 0004   HGBUR NEGATIVE 06/11/2016 0004   BILIRUBINUR NEGATIVE 06/11/2016 0004   KETONESUR NEGATIVE 06/11/2016 0004   PROTEINUR NEGATIVE 06/11/2016 0004   UROBILINOGEN 0.2 09/19/2014 1633   NITRITE NEGATIVE 06/11/2016 0004   LEUKOCYTESUR SMALL (A) 06/11/2016 0004   Sepsis Labs: @LABRCNTIP (procalcitonin:4,lacticidven:4) ) Recent Results (from the past 240 hour(s))  Blood Culture (routine x 2)     Status: None (Preliminary result)   Collection Time:  06/10/16 10:50 PM  Result Value Ref Range Status   Specimen Description BLOOD RIGHT HAND  Final   Special Requests BOTTLES DRAWN AEROBIC AND ANAEROBIC 5CC EA  Final   Culture NO GROWTH < 24 HOURS  Final   Report Status PENDING  Incomplete  Blood Culture (routine x 2)     Status: None (Preliminary result)   Collection Time: 06/10/16 10:55 PM  Result Value Ref Range Status   Specimen Description BLOOD LEFT HAND  Final   Special Requests BOTTLES DRAWN AEROBIC AND ANAEROBIC 5CC EA  Final   Culture NO GROWTH < 24 HOURS  Final   Report Status PENDING  Incomplete  Urine culture  Status: Abnormal   Collection Time: 06/11/16 12:04 AM  Result Value Ref Range Status   Specimen Description URINE, RANDOM  Final   Special Requests NONE  Final   Culture <10,000 COLONIES/mL INSIGNIFICANT GROWTH (A)  Final   Report Status 06/12/2016 FINAL  Final  MRSA PCR Screening     Status: None   Collection Time: 06/11/16  2:30 AM  Result Value Ref Range Status   MRSA by PCR NEGATIVE NEGATIVE Final    Comment:        The GeneXpert MRSA Assay (FDA approved for NASAL specimens only), is one component of a comprehensive MRSA colonization surveillance program. It is not intended to diagnose MRSA infection nor to guide or monitor treatment for MRSA infections.          Radiology Studies: Dg Chest 2 View  Result Date: 06/10/2016 CLINICAL DATA:  Cough, congestion, fever, wheezing for 1 week. History of COPD, hypertension. EXAM: CHEST  2 VIEW COMPARISON:  CT chest March 01, 2016 FINDINGS: Approximate 4 cm masslike consolidation LEFT upper lobe. The cardiac silhouette is mildly enlarged. Mildly calcified aortic knob. Increased lung volumes. Mildly elevated RIGHT hemidiaphragm. No pleural effusion. No pneumothorax. Vagal stimulator LEFT chest. Osseous structures are nonsuspicious. IMPRESSION: Masslike consolidation LEFT upper lobe, recommend CT chest with contrast. Mild cardiomegaly. Electronically Signed    By: Awilda Metro M.D.   On: 06/10/2016 22:21   Ct Chest W Contrast  Result Date: 06/11/2016 CLINICAL DATA:  Shortness of breath with cough and weakness. Abnormal chest radiograph EXAM: CT CHEST WITH CONTRAST TECHNIQUE: Multidetector CT imaging of the chest was performed during intravenous contrast administration. CONTRAST:  75mL ISOVUE-300 IOPAMIDOL (ISOVUE-300) INJECTION 61% COMPARISON:  Chest CT March 01, 2016 and chest radiograph June 10, 2016 FINDINGS: Cardiovascular: There is no thoracic aortic aneurysm or dissection. There are scattered foci of calcification visualized great vessels as well as scattered foci of calcification in the thoracic aorta. There are scattered foci of coronary artery calcification. Pericardium is not appreciably thickened. No major vessel pulmonary embolus evident. Mediastinum/Nodes: Visualized thyroid is within normal limits. There are a few subcentimeter mediastinal lymph nodes. There is a stable mildly prominent sub- carinal lymph node measuring 1.6 x 1.1 cm. No new lymph node prominence evident. Lungs/Pleura: There is focal consolidation in the posterior segment of the left upper lobe. There are air bronchograms extending throughout this area of consolidation. A well-defined underlying mass lesion is not evident in this area. Note that no mass lesion was seen in this area on CT examination approximately 3 months prior. There is patchy atelectatic change in the lung bases. There is slight scarring in the extreme lung apices. There is no pleural thickening or pleural effusion. Upper Abdomen: In the visualized upper abdomen, gallbladder is absent. There is stable prominence of the visualize common bile duct, measuring 12 mm. There are stable cystic areas in the liver, largest in the medial segment left lobe measuring 4.1 x 3.8 cm. There is atherosclerotic calcification in the aorta. 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 There is bowel extending  into a ventral hernia, incompletely visualized. The visualized bowel in this area appears unremarkable. Musculoskeletal: There are no blastic or lytic bone lesions. There is stable eventration of the right hemidiaphragm. IMPRESSION: Airspace consolidation containing air bronchograms in the posterior segment of the left upper lobe, felt to be consistent with pneumonia. No mass lesion is seen in this area. Follow-up imaging to clearing in this circumstance is  advised to exclude the possibility of a small underlying lesion. Stable sub- carinal lymph node prominence. No new lymph node prominence compared to prior CT. Scattered foci of atherosclerotic calcification as well as foci of coronary artery calcification. Small calcified splenic artery aneurysm, not felt to be of clinical significance in this age group. Cystic areas in the liver, stable. Incomplete visualization of ventral hernia. Bowel which is visualized in this region appears unremarkable. Gallbladder absent. Visualized common bile duct prominent but stable. Electronically Signed   By: Bretta Bang III M.D.   On: 06/11/2016 11:22   Ct Abdomen Pelvis W Contrast  Result Date: 06/11/2016 CLINICAL DATA:  Patient admitted for sepsis and pneumonia. Fever. Difficulty breathing. Known abdominal hernia. EXAM: CT ABDOMEN AND PELVIS WITH CONTRAST TECHNIQUE: Multidetector CT imaging of the abdomen and pelvis was performed using the standard protocol following bolus administration of intravenous contrast. CONTRAST:  80mL ISOVUE-300 IOPAMIDOL (ISOVUE-300) INJECTION 61% COMPARISON:  CT abdomen dated 09/19/2014. CT abdomen dated 04/22/2014. FINDINGS: Lower chest: No acute abnormality. Hepatobiliary: Multiple hepatic cysts are not significantly changed in the interval. Status post cholecystectomy with associated mild bile duct ectasia. Pancreas: Unremarkable. No pancreatic ductal dilatation or surrounding inflammatory changes. Spleen: Normal in size without focal  abnormality. Adrenals/Urinary Tract: Adrenal glands appear normal. Kidneys appear normal without mass, stone or hydronephrosis. No ureteral or bladder calculi identified. Bladder appears normal. Stomach/Bowel: Chronic supraumbilical abdominal wall hernia contains a segment of the stomach body/antrum, not significantly changed in extent or configuration compared to the previous exams. No evidence of gastric obstruction or acute inflammation at the hernia site. No dilated large or small bowel loops. Scattered mild diverticulosis of the sigmoid colon without evidence of acute diverticulitis. Appendix is not convincingly seen but there are no inflammatory changes about the cecum to suggest acute appendicitis. Vascular/Lymphatic: Atherosclerotic changes of the normal caliber abdominal aorta. No enlarged lymph nodes seen. Reproductive: Status post hysterectomy.  No adnexal mass. Other: No free fluid or abscess collection within the abdomen or pelvis. No free intraperitoneal air. Musculoskeletal: Degenerative changes throughout the slightly scoliotic thoracolumbar spine. No acute or suspicious osseous finding. IMPRESSION: 1. No acute findings within the abdomen or pelvis. No free fluid or abscess. No free intraperitoneal air. No bowel obstruction or evidence of bowel wall inflammation. No evidence of acute solid organ abnormality. 2. Mild colonic diverticulosis without evidence of acute diverticulitis. 3. Midline supraumbilical abdominal wall hernia which contains a segment of the stomach body/antrum, not significantly changed in extent or configuration compared to multiple prior exams. No evidence of associated gastric obstruction. 4. Aortic atherosclerosis. 5. Additional chronic/incidental findings detailed above. Electronically Signed   By: Bary Richard M.D.   On: 06/11/2016 22:42      Scheduled Meds: . atorvastatin  40 mg Oral Daily  . budesonide (PULMICORT) nebulizer solution  0.25 mg Nebulization BID  .  cholecalciferol  1,000 Units Oral Daily  . clopidogrel  75 mg Oral Daily  . donepezil  10 mg Oral QHS  . enoxaparin (LOVENOX) injection  40 mg Subcutaneous Q24H  . ferrous sulfate  325 mg Oral Q breakfast  . fluticasone  2 spray Each Nare Daily  . guaiFENesin  600 mg Oral BID  . ipratropium-albuterol  3 mL Nebulization Q6H  . irbesartan  300 mg Oral Daily  . lamoTRIgine  200 mg Oral BID  . levofloxacin (LEVAQUIN) IV  750 mg Intravenous Q24H  . loratadine  10 mg Oral Daily  . LORazepam  1 mg Oral Q12H  .  magnesium oxide  400 mg Oral Daily  . montelukast  10 mg Oral QHS  . pantoprazole  40 mg Oral Daily  . QUEtiapine  150 mg Oral BID  . senna-docusate  1 tablet Oral BID  . verapamil  120 mg Oral Daily   Continuous Infusions:   LOS: 2 days    Time spent in minutes: 35    Bach Rocchi, MD [facility] Pager: [email or web address] Password [email or web address] 06/12/2016, 8:48 AM

## 2016-06-12 NOTE — Progress Notes (Signed)
CRITICAL VALUE STICKER  CRITICAL VALUE: lactic acid 2.3   RECEIVER (on-site recipient of call): Cindy Padilla  DATE & TIME NOTIFIED: 06/12/16 11:51am  MESSENGER (representative from lab): Lanier PrudeNesvie Livingston  MD NOTIFIED: Butler Denmarkizwan    TIME OF NOTIFICATION: 10:52  RESPONSE:

## 2016-06-12 NOTE — Progress Notes (Signed)
Patient ambulated the halls for 540 ft, tolerated well, SPO2 was 91-96% on room air, with front wheel walker.

## 2016-06-13 ENCOUNTER — Inpatient Hospital Stay (HOSPITAL_COMMUNITY): Payer: Medicare Other

## 2016-06-13 DIAGNOSIS — I5032 Chronic diastolic (congestive) heart failure: Secondary | ICD-10-CM

## 2016-06-13 LAB — BASIC METABOLIC PANEL
Anion gap: 8 (ref 5–15)
BUN: 15 mg/dL (ref 6–20)
CALCIUM: 8.9 mg/dL (ref 8.9–10.3)
CO2: 26 mmol/L (ref 22–32)
CREATININE: 0.81 mg/dL (ref 0.44–1.00)
Chloride: 103 mmol/L (ref 101–111)
GFR calc Af Amer: >60 mL/min (ref >60)
Glucose, Bld: 88 mg/dL (ref 65–99)
Potassium: 3.9 mmol/L (ref 3.5–5.1)
Sodium: 137 mmol/L (ref 135–145)

## 2016-06-13 LAB — CBC
HCT: 31 % — ABNORMAL LOW (ref 36.0–46.0)
HEMOGLOBIN: 10.1 g/dL — AB (ref 12.0–15.0)
MCH: 30.3 pg (ref 26.0–34.0)
MCHC: 32.6 g/dL (ref 30.0–36.0)
MCV: 93.1 fL (ref 78.0–100.0)
PLATELETS: 219 10*3/uL (ref 150–400)
RBC: 3.33 MIL/uL — ABNORMAL LOW (ref 3.87–5.11)
RDW: 15.8 % — AB (ref 11.5–15.5)
WBC: 13.1 10*3/uL — ABNORMAL HIGH (ref 4.0–10.5)

## 2016-06-13 LAB — LEGIONELLA PNEUMOPHILA SEROGP 1 UR AG: L. PNEUMOPHILA SEROGP 1 UR AG: NEGATIVE

## 2016-06-13 MED ORDER — ALBUTEROL SULFATE (2.5 MG/3ML) 0.083% IN NEBU
2.5000 mg | INHALATION_SOLUTION | RESPIRATORY_TRACT | Status: AC
  Administered 2016-06-13: 2.5 mg via RESPIRATORY_TRACT
  Filled 2016-06-13: qty 3

## 2016-06-13 MED ORDER — ACETYLCYSTEINE 20 % IN SOLN
4.0000 mL | Freq: Three times a day (TID) | RESPIRATORY_TRACT | Status: DC
  Administered 2016-06-13 – 2016-06-15 (×8): 4 mL via RESPIRATORY_TRACT
  Filled 2016-06-13 (×11): qty 4

## 2016-06-13 MED ORDER — METHYLPREDNISOLONE SODIUM SUCC 40 MG IJ SOLR
40.0000 mg | INTRAMUSCULAR | Status: AC
  Administered 2016-06-13: 40 mg via INTRAVENOUS
  Filled 2016-06-13: qty 1

## 2016-06-13 NOTE — Progress Notes (Addendum)
Pt wheezing on assessment, duo neb prn given, no significant improvement afterwards. Pt c/o sore throat and persistent cough. Craige CottaKirby, NP notified. New orders placed, pt verbalized improvement, wheezing still auscultated but improvement noted.

## 2016-06-13 NOTE — Progress Notes (Signed)
PROGRESS NOTE    Cindy Padilla   ZOX:[id number]  DOB: 1940-11-14  DOA: 06/10/2016 PCP: Ron Parker, MD   Brief Narrative:  76 year old female with history of hypertension, asthma, diastolic CHF and dementia who is a resident of independent living was brought to the ED with persistent shortness of breath and productive cough. Symptoms started a week back with sinus symptoms gradually progressing to dyspnea and wheezing associated with some chest tightness. In the ED shows found to be wheezing, tachypneic, febrile 102F and hypoxic. Neck slight chest x-ray showing left upper lobe consolidation with elevated lactate and significant leukocytosis (31K) Patient admitted for sepsis with pneumonia.  Subjective: Continues to have cough and a great deal of chest congestion.   Assessment & Plan:   Principal Problem:  Severe Sepsis/ CAP (community acquired pneumonia) with underlying asthma - LUL- positive strep antigen - Levaquin - follow Lactic acid level - leukocytosis improving - added flutter valve and chest PT due to severe congestion - added Prednisone for wheezing   Active Problems: Essential hypertension Stable.  Continue Diovan  - noted to be on 2 CCB - have stopped both Norvasc and Verapamil due to severe pedal edema- replaced with Labetalol - hold Lasix due to dehydration  Chronic diastolic CHF - grade 1 per last ECHO in 9/14  Mildly elevated Troponin - one troponin out of 3 noted to be elevated at 0.04 - likely due to respiratory issues - will not order further work up  B/l pitting edema -  seems to be chronic- noted to be on both Norvasc and Verapamil- will stop both and replace with Labetalol    Chronic liver cysts and ventral hernia Seems to be unchanged   Dementia - Aricept, Seroquel,  Ativan BID     DVT prophylaxis: Lovenox Code Status: DNR Family Communication:  Disposition Plan: return to SNF when stable Consultants:    Procedures:     Antimicrobials:  Anti-infectives    Start     Dose/Rate Route Frequency Ordered Stop   06/11/16 2200  levofloxacin (LEVAQUIN) IVPB 750 mg     750 mg 100 mL/hr over 90 Minutes Intravenous Every 24 hours 06/11/16 0302 06/16/16 2159   06/10/16 2230  levofloxacin (LEVAQUIN) IVPB 750 mg     750 mg 100 mL/hr over 90 Minutes Intravenous  Once 06/10/16 2218 06/11/16 0044       Objective: Vitals:   06/13/16 0757 06/13/16 0801 06/13/16 0821 06/13/16 0917  BP:   (!) 192/65 (!) 170/72  Pulse:   71   Resp:      Temp:      TempSrc:      SpO2: 95% 98% 92%   Weight:      Height:        Intake/Output Summary (Last 24 hours) at 06/13/16 1218 Last data filed at 06/13/16 1118  Gross per 24 hour  Intake              620 ml  Output             1950 ml  Net            -1330 ml   Filed Weights   06/11/16 0219 06/12/16 0533 06/13/16 0500  Weight: 84.7 kg (186 lb 12.8 oz) 86.4 kg (190 lb 8 oz) 86 kg (189 lb 8 oz)    Examination: General exam: Appears comfortable  HEENT: PERRLA, oral mucosa moist, no sclera icterus or thrush Respiratory system: + wheezing and rhonchi, on room air.  Respiratory effort normal. Cardiovascular system: S1 & S2 heard, RRR.  No murmurs  Gastrointestinal system: Abdomen soft, non-tender, nondistended. Normal bowel sound. No organomegaly Central nervous system: Alert and oriented. No focal neurological deficits. Extremities: No cyanosis, clubbing - 2 + edema Skin: No rashes or ulcers Psychiatry:  Mood & affect appropriate.     Data Reviewed: I have personally reviewed following labs and imaging studies  CBC:  Recent Labs Lab 06/10/16 2130 06/11/16 0543 06/11/16 1836 06/12/16 0558 06/13/16 0541  WBC 27.4* 31.3* 25.2* 19.8* 13.1*  NEUTROABS 21.6* 29.7*  --   --   --   HGB 12.4 11.4* 10.7* 10.1* 10.1*  HCT 37.0 34.2* 32.7* 31.0* 31.0*  MCV 91.1 92.2 92.1 92.3 93.1  PLT 226 189 219 206 219   Basic Metabolic Panel:  Recent Labs Lab 06/10/16 2130  06/11/16 0543 06/12/16 0558 06/13/16 0541  NA 133* 134* 136 137  K 4.3 4.5 3.6 3.9  CL 94* 100* 103 103  CO2 26 22 25 26   GLUCOSE 109* 169* 132* 88  BUN 11 10 13 15   CREATININE 0.83 0.75 0.71 0.81  CALCIUM 9.0 8.3* 8.6* 8.9   GFR: Estimated Creatinine Clearance: 57.6 mL/min (by C-G formula based on SCr of 0.81 mg/dL). Liver Function Tests:  Recent Labs Lab 06/11/16 0543  AST 18  ALT 17  ALKPHOS 75  BILITOT 0.4  PROT 6.4*  ALBUMIN 3.2*   No results for input(s): LIPASE, AMYLASE in the last 168 hours. No results for input(s): AMMONIA in the last 168 hours. Coagulation Profile:  Recent Labs Lab 06/11/16 0543  INR 1.16   Cardiac Enzymes:  Recent Labs Lab 06/11/16 0543 06/11/16 1117 06/11/16 1553  TROPONINI <0.03 <0.03 0.04*   BNP (last 3 results) No results for input(s): PROBNP in the last 8760 hours. HbA1C: No results for input(s): HGBA1C in the last 72 hours. CBG: No results for input(s): GLUCAP in the last 168 hours. Lipid Profile: No results for input(s): CHOL, HDL, LDLCALC, TRIG, CHOLHDL, LDLDIRECT in the last 72 hours. Thyroid Function Tests: No results for input(s): TSH, T4TOTAL, FREET4, T3FREE, THYROIDAB in the last 72 hours. Anemia Panel: No results for input(s): VITAMINB12, FOLATE, FERRITIN, TIBC, IRON, RETICCTPCT in the last 72 hours. Urine analysis:    Component Value Date/Time   COLORURINE STRAW (A) 06/11/2016 0004   APPEARANCEUR CLEAR 06/11/2016 0004   LABSPEC 1.006 06/11/2016 0004   PHURINE 8.0 06/11/2016 0004   GLUCOSEU NEGATIVE 06/11/2016 0004   HGBUR NEGATIVE 06/11/2016 0004   BILIRUBINUR NEGATIVE 06/11/2016 0004   KETONESUR NEGATIVE 06/11/2016 0004   PROTEINUR NEGATIVE 06/11/2016 0004   UROBILINOGEN 0.2 09/19/2014 1633   NITRITE NEGATIVE 06/11/2016 0004   LEUKOCYTESUR SMALL (A) 06/11/2016 0004   Sepsis Labs: @LABRCNTIP (procalcitonin:4,lacticidven:4) ) Recent Results (from the past 240 hour(s))  Blood Culture (routine x 2)      Status: None (Preliminary result)   Collection Time: 06/10/16 10:50 PM  Result Value Ref Range Status   Specimen Description BLOOD RIGHT HAND  Final   Special Requests BOTTLES DRAWN AEROBIC AND ANAEROBIC 5CC EA  Final   Culture NO GROWTH 2 DAYS  Final   Report Status PENDING  Incomplete  Blood Culture (routine x 2)     Status: None (Preliminary result)   Collection Time: 06/10/16 10:55 PM  Result Value Ref Range Status   Specimen Description BLOOD LEFT HAND  Final   Special Requests BOTTLES DRAWN AEROBIC AND ANAEROBIC 5CC EA  Final   Culture NO GROWTH  2 DAYS  Final   Report Status PENDING  Incomplete  Urine culture     Status: Abnormal   Collection Time: 06/11/16 12:04 AM  Result Value Ref Range Status   Specimen Description URINE, RANDOM  Final   Special Requests NONE  Final   Culture <10,000 COLONIES/mL INSIGNIFICANT GROWTH (A)  Final   Report Status 06/12/2016 FINAL  Final  MRSA PCR Screening     Status: None   Collection Time: 06/11/16  2:30 AM  Result Value Ref Range Status   MRSA by PCR NEGATIVE NEGATIVE Final    Comment:        The GeneXpert MRSA Assay (FDA approved for NASAL specimens only), is one component of a comprehensive MRSA colonization surveillance program. It is not intended to diagnose MRSA infection nor to guide or monitor treatment for MRSA infections.          Radiology Studies: Ct Abdomen Pelvis W Contrast  Result Date: 06/11/2016 CLINICAL DATA:  Patient admitted for sepsis and pneumonia. Fever. Difficulty breathing. Known abdominal hernia. EXAM: CT ABDOMEN AND PELVIS WITH CONTRAST TECHNIQUE: Multidetector CT imaging of the abdomen and pelvis was performed using the standard protocol following bolus administration of intravenous contrast. CONTRAST:  80mL ISOVUE-300 IOPAMIDOL (ISOVUE-300) INJECTION 61% COMPARISON:  CT abdomen dated 09/19/2014. CT abdomen dated 04/22/2014. FINDINGS: Lower chest: No acute abnormality. Hepatobiliary: Multiple hepatic  cysts are not significantly changed in the interval. Status post cholecystectomy with associated mild bile duct ectasia. Pancreas: Unremarkable. No pancreatic ductal dilatation or surrounding inflammatory changes. Spleen: Normal in size without focal abnormality. Adrenals/Urinary Tract: Adrenal glands appear normal. Kidneys appear normal without mass, stone or hydronephrosis. No ureteral or bladder calculi identified. Bladder appears normal. Stomach/Bowel: Chronic supraumbilical abdominal wall hernia contains a segment of the stomach body/antrum, not significantly changed in extent or configuration compared to the previous exams. No evidence of gastric obstruction or acute inflammation at the hernia site. No dilated large or small bowel loops. Scattered mild diverticulosis of the sigmoid colon without evidence of acute diverticulitis. Appendix is not convincingly seen but there are no inflammatory changes about the cecum to suggest acute appendicitis. Vascular/Lymphatic: Atherosclerotic changes of the normal caliber abdominal aorta. No enlarged lymph nodes seen. Reproductive: Status post hysterectomy.  No adnexal mass. Other: No free fluid or abscess collection within the abdomen or pelvis. No free intraperitoneal air. Musculoskeletal: Degenerative changes throughout the slightly scoliotic thoracolumbar spine. No acute or suspicious osseous finding. IMPRESSION: 1. No acute findings within the abdomen or pelvis. No free fluid or abscess. No free intraperitoneal air. No bowel obstruction or evidence of bowel wall inflammation. No evidence of acute solid organ abnormality. 2. Mild colonic diverticulosis without evidence of acute diverticulitis. 3. Midline supraumbilical abdominal wall hernia which contains a segment of the stomach body/antrum, not significantly changed in extent or configuration compared to multiple prior exams. No evidence of associated gastric obstruction. 4. Aortic atherosclerosis. 5. Additional  chronic/incidental findings detailed above. Electronically Signed   By: Bary Richard M.D.   On: 06/11/2016 22:42   Dg Chest Port 1 View  Result Date: 06/13/2016 CLINICAL DATA:  Wheezing.  Pneumonia, asthma. EXAM: PORTABLE CHEST 1 VIEW COMPARISON:  CT 06/11/2016, radiographs 1158. FINDINGS: Slight decreased size rounded opacity in the left perihilar lung with air bronchograms. Linear subsegmental atelectasis in the right mid lung, no new confluent airspace disease. Low lung volumes. No pleural fluid or pneumothorax. Unchanged heart size and mediastinal contours. Stimulator battery pack projects over the left chest. IMPRESSION:  Decreased size rounded left perihilar opacity. Recommend continued radiographic follow-up to resolution. Subsegmental atelectasis right mid lung. Electronically Signed   By: Rubye OaksMelanie  Ehinger M.D.   On: 06/13/2016 06:01      Scheduled Meds: . acetylcysteine  4 mL Nebulization TID  . atorvastatin  40 mg Oral Daily  . budesonide (PULMICORT) nebulizer solution  0.25 mg Nebulization BID  . cholecalciferol  1,000 Units Oral Daily  . clopidogrel  75 mg Oral Daily  . donepezil  10 mg Oral QHS  . enoxaparin (LOVENOX) injection  40 mg Subcutaneous Q24H  . ferrous sulfate  325 mg Oral Q breakfast  . fluticasone  2 spray Each Nare Daily  . guaiFENesin  600 mg Oral BID  . ipratropium-albuterol  3 mL Nebulization Q6H  . irbesartan  300 mg Oral Daily  . labetalol  100 mg Oral BID  . lamoTRIgine  200 mg Oral BID  . levofloxacin (LEVAQUIN) IV  750 mg Intravenous Q24H  . loratadine  10 mg Oral Daily  . LORazepam  1 mg Oral Q12H  . magnesium oxide  400 mg Oral Daily  . montelukast  10 mg Oral QHS  . pantoprazole  40 mg Oral Daily  . predniSONE  40 mg Oral Q breakfast  . QUEtiapine  150 mg Oral BID  . senna-docusate  1 tablet Oral BID   Continuous Infusions:   LOS: 3 days    Time spent in minutes: 35    Milia Warth, MD [facility] Pager: [email or web address] Password [email or web address] 06/13/2016, 12:18 PM

## 2016-06-14 DIAGNOSIS — R600 Localized edema: Secondary | ICD-10-CM

## 2016-06-14 MED ORDER — LABETALOL HCL 200 MG PO TABS
200.0000 mg | ORAL_TABLET | Freq: Two times a day (BID) | ORAL | Status: DC
  Administered 2016-06-14 – 2016-06-16 (×4): 200 mg via ORAL
  Filled 2016-06-14 (×4): qty 1

## 2016-06-14 NOTE — [place] FL2 (Signed)
MEDICAID FL2 LEVEL OF CARE SCREENING TOOL     IDENTIFICATION  Patient Name: Cindy Padilla Birthdate: 1940/11/30 Sex: female Admission Date (Current Location): 06/10/2016  [facility] [place]   Facility and Address:  [facility], [address], [place], [place] [address]      Provider Number: [id number]  Attending Physician Name and Address:  Calvert CantorSaima Rizwan, MD  Relative Name and Phone Number:       Current Level of Care: Hospital Recommended Level of Care: Assisted Living Facility Prior Approval Number:    Date Approved/Denied:   PASRR Number:    Discharge Plan: Other (Comment) (Assisted Living)    Current Diagnoses: Patient Active Problem List   Diagnosis Date Noted  . CAP (community acquired pneumonia) 06/11/2016  . Sepsis (HCC) 06/10/2016  . Morbid obesity (HCC) 04/14/2015  . Upper airway cough syndrome 04/14/2015  . Ventral hernia 04/24/2014  . Constipation 04/24/2014  . Abdominal pain, epigastric 04/24/2014  . Partial small bowel obstruction 04/22/2014  . Hemorrhoids 04/22/2014  . Abnormality of gait 09/30/2013  . 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 Diastolic heart failure (HCC) 07/27/2013  . Dementia with behavioral disturbance 07/27/2013  . HCAP (healthcare-associated pneumonia) 07/24/2013  . Acute respiratory failure (HCC) 02/21/2013  . Weakness 02/09/2013  . Hypokalemia 02/09/2013  . Hyponatremia 02/09/2013  . SOB (shortness of breath) 02/09/2013  . HTN (hypertension) 02/09/2013  . Other and unspecified hyperlipidemia 02/09/2013    Orientation RESPIRATION BLADDER Height & Weight     Self, Time, Situation, Place  Normal Continent Weight: 85.7 kg (189 lb) Height:  5' (152.4 cm)  BEHAVIORAL SYMPTOMS/MOOD NEUROLOGICAL BOWEL NUTRITION STATUS      Continent Diet (Regular)  AMBULATORY STATUS COMMUNICATION OF NEEDS Skin   Supervision Verbally Other (Comment)  (Wound on leg)                       Personal Care Assistance Level of Assistance  Bathing, Feeding, Dressing Bathing Assistance: Limited assistance Feeding assistance: Independent Dressing Assistance: Independent     Functional Limitations Info             SPECIAL CARE FACTORS FREQUENCY                       Contractures      Additional Factors Info  Code Status, Allergies, Psychotropic Code Status Info: DNR Allergies Info:  Aminoglycosides, Aspirin, Codeine, Ivp Dye Iodinated Diagnostic Agents, Morphine And Related, Neomycin, Penicillins, Phenothiazines, Promethazine, Sulfa Antibiotics, Tetracyclines & Related Psychotropic Info: Seroquel         Current Medications (06/14/2016):  This is the current hospital active medication list Current Facility-Administered Medications  Medication Dose Route Frequency Provider Last Rate Last Dose  . acetaminophen (TYLENOL) tablet 650 mg  650 mg Oral Q6H PRN Eduard ClosArshad N Kakrakandy, MD       Or  . acetaminophen (TYLENOL) suppository 650 mg  650 mg Rectal Q6H PRN Eduard ClosArshad N Kakrakandy, MD      . acetylcysteine (MUCOMYST) 20 % nebulizer / oral solution 4 mL  4 mL Nebulization TID Calvert CantorSaima Rizwan, MD   4 mL at 06/14/16 0859  . albuterol (PROVENTIL) (2.5 MG/3ML) 0.083% nebulizer solution 2.5 mg  2.5 mg Nebulization Q4H PRN Nishant Dhungel, MD   2.5 mg at 06/12/16 2316  . antiseptic oral rinse (BIOTENE) solution 15 mL  15 mL Mouth Rinse PRN Calvert CantorSaima Rizwan, MD      .  atorvastatin (LIPITOR) tablet 40 mg  40 mg Oral Daily Eduard Clos, MD   40 mg at 06/14/16 0933  . budesonide (PULMICORT) nebulizer solution 0.25 mg  0.25 mg Nebulization BID Eduard Clos, MD   0.25 mg at 06/14/16 0859  . cholecalciferol (VITAMIN D) tablet 1,000 Units  1,000 Units Oral Daily Eduard Clos, MD   1,000 Units at 06/14/16 0933  . clopidogrel (PLAVIX) tablet 75 mg  75 mg Oral Daily Eduard Clos, MD   75 mg at 06/14/16 0933  . donepezil  (ARICEPT) tablet 10 mg  10 mg Oral QHS Eduard Clos, MD   10 mg at 06/13/16 2125  . enoxaparin (LOVENOX) injection 40 mg  40 mg Subcutaneous Q24H Eduard Clos, MD   40 mg at 06/13/16 1514  . ferrous sulfate tablet 325 mg  325 mg Oral Q breakfast Eduard Clos, MD   325 mg at 06/14/16 0933  . fluticasone (FLONASE) 50 MCG/ACT nasal spray 2 spray  2 spray Each Nare Daily Eduard Clos, MD   2 spray at 06/14/16 0933  . guaiFENesin (MUCINEX) 12 hr tablet 600 mg  600 mg Oral BID Nishant Dhungel, MD   600 mg at 06/14/16 0932  . guaiFENesin (ROBITUSSIN) 100 MG/5ML solution 200 mg  200 mg Oral TID PRN Eduard Clos, MD   200 mg at 06/13/16 0831  . ipratropium-albuterol (DUONEB) 0.5-2.5 (3) MG/3ML nebulizer solution 3 mL  3 mL Nebulization Q2H PRN Eduard Clos, MD   3 mL at 06/13/16 0500  . ipratropium-albuterol (DUONEB) 0.5-2.5 (3) MG/3ML nebulizer solution 3 mL  3 mL Nebulization Q6H Nishant Dhungel, MD   3 mL at 06/14/16 0859  . irbesartan (AVAPRO) tablet 300 mg  300 mg Oral Daily Eduard Clos, MD   300 mg at 06/14/16 0933  . labetalol (NORMODYNE) tablet 200 mg  200 mg Oral BID [name], MD   200 mg at 06/14/16 0933  . lamoTRIgine (LAMICTAL) tablet 200 mg  200 mg Oral BID Eduard Clos, MD   200 mg at 06/14/16 0933  . levofloxacin (LEVAQUIN) IVPB 750 mg  750 mg Intravenous Q24H Eduard Clos, MD   750 mg at 06/13/16 2131  . loratadine (CLARITIN) tablet 10 mg  10 mg Oral Daily Eduard Clos, MD   10 mg at 06/14/16 0933  . LORazepam (ATIVAN) tablet 1 mg  1 mg Oral Q12H Eduard Clos, MD   1 mg at 06/14/16 0932  . magnesium oxide (MAG-OX) tablet 400 mg  400 mg Oral Daily Eduard Clos, MD   400 mg at 06/14/16 0933  . montelukast (SINGULAIR) tablet 10 mg  10 mg Oral QHS Eduard Clos, MD   10 mg at 06/13/16 2124  . ondansetron (ZOFRAN) tablet 4 mg  4 mg Oral Q6H PRN Eduard Clos, MD       Or  . ondansetron [facility])  injection 4 mg  4 mg Intravenous Q6H PRN Eduard Clos, MD      . pantoprazole (PROTONIX) EC tablet 40 mg  40 mg Oral Daily Eduard Clos, MD   40 mg at 06/14/16 0933  . predniSONE (DELTASONE) tablet 40 mg  40 mg Oral Q breakfast [name], MD   40 mg at 06/14/16 0933  . QUEtiapine (SEROQUEL) tablet 150 mg  150 mg Oral BID Eduard Clos, MD   150 mg at 06/14/16 0933  . senna-docusate (Senokot-S) tablet 1  tablet  1 tablet Oral BID Eduard Clos, MD   1 tablet at 06/14/16 9604     Discharge Medications: Please see discharge summary for a list of discharge medications.  Relevant Imaging Results:  Relevant Lab Results:   Additional Information SSN: [id number] [place], [place]

## 2016-06-14 NOTE — Clinical Social Work Note (Signed)
Clinical Social Work Assessment  Patient Details  Name: Cindy Padilla MRN: [id number] Date of Birth: [birth year]  Date of referral:  06/14/16               Reason for consult:  Discharge Planning                Permission sought to share information with:  Oceanographeracility Contact Representative Permission granted to share information::  Yes, Verbal Permission Granted  Name::        Ag[facility]  Relationship::     Contact Information:     Housing/Transportation Living arrangements for the past 2 months:  [facility] Source of Information:  Patient Patient Interpreter Needed:  None Criminal Activity/Legal Involvement Pertinent to Current Situation/Hospitalization:  No - Comment as needed Significant Relationships:  Adult Children Lives with:  Facility Resident Do you feel safe going back to the place where you live?  Yes Need for family participation in patient care:  No (Coment)  Care giving concerns:  CSW received consult for discharge planning. Patient reported that [facility] and she would like to return there at discharge. CSW to continue to follow and assist with discharge planning needs.   Social Worker assessment / plan:  CSW spoke with patient concerning return to ALF.  Employment status:  Retired Health and safety inspectornsurance information:  Medicare PT Recommendations:  Not assessed at this time Information / Referral to community resources:     Patient/Family's Response to care:  Patient is very pleasant and would like to get back to her ALF.  Patient/Family's Understanding of and Emotional Response to Diagnosis, Current Treatment, and Prognosis:  Patient expressed understanding of CSW role and discharge process. No questions/concerns about plan or treatment.    Emotional Assessment Appearance:  Appears stated age Attitude/Demeanor/Rapport:  Other (Appropriate) Affect (typically observed):  Accepting, Appropriate, Pleasant Orientation:   Oriented to Self, Oriented to Place, Oriented to  Time, Oriented to Situation Alcohol / Substance use:  Not Applicable Psych involvement (Current and /or in the community):  No (Comment)  Discharge Needs  Concerns to be addressed:  Care Coordination Readmission within the last 30 days:  No Current discharge risk:  None Barriers to Discharge:  Continued Medical Work up   [employer] Yun Gutierrez, LCSWA 06/14/2016, 10:41 AM

## 2016-06-14 NOTE — Care Management Important Message (Signed)
Important Message  Patient Details  Name: Lupita DawnChristine B Barton MRN: [id number] Date of Birth: 05-06-1941   Medicare Important Message Given:  Yes    Kyla BalzarineShealy, Kirstie Larsen Abena 06/14/2016, 5:23 PM

## 2016-06-14 NOTE — Progress Notes (Signed)
PROGRESS NOTE    Cindy Padilla   RUE:[id number]:[id number]  DOB: [birth year]  DOA: 06/10/2016 PCP: Ron ParkerBOWEN,SAMUEL, MD   Brief Narrative:  76 year old female with history of hypertension, asthma, diastolic CHF and dementia who is a resident of independent living was brought to the ED with persistent shortness of breath and productive cough. Symptoms started a week back with sinus symptoms gradually progressing to dyspnea and wheezing associated with some chest tightness. In the ED shows found to be wheezing, tachypneic, febrile 102F and hypoxic. Neck slight chest x-ray showing left upper lobe consolidation with elevated lactate and significant leukocytosis (31K) Patient admitted for sepsis with pneumonia.  Subjective: She states cough and congestion have improved slightly.   Assessment & Plan:   Principal Problem:  Severe Sepsis/ CAP (community acquired pneumonia) with underlying asthma - LUL- positive strep antigen - Levaquin - Lactic acidosis has resolved - leukocytosis improving- 31 >> 13.1 - added flutter valve, Mucomyst inhalations and chest PT due to severe congestion - added Prednisone for wheezing   Active Problems: Essential hypertension Stable.  Continue Diovan  - noted to be on 2 CCB - have stopped both Norvasc and Verapamil due to severe pedal edema- replaced with Labetalol   - hold Lasix due to dehydration  Chronic diastolic CHF - grade 1 per last ECHO in 9/14  Mildly elevated Troponin - one troponin out of 3 noted to be elevated at 0.04 - likely due to respiratory issues - will not order further work up  B/l pitting edema -  seems to be chronic- noted to be on both Norvasc and Verapamil- will stop both and replace with Labetalol   - TEDs, elevate  Chronic liver cysts and ventral hernia Seems to be unchanged   Dementia - Aricept, Seroquel,  Ativan BID     DVT prophylaxis: Lovenox Code Status: DNR Family Communication:  Disposition Plan: return to SNF  when stable Consultants:    Procedures:    Antimicrobials:  Anti-infectives    Start     Dose/Rate Route Frequency Ordered Stop   06/11/16 2200  levofloxacin (LEVAQUIN) IVPB 750 mg     750 mg 100 mL/hr over 90 Minutes Intravenous Every 24 hours 06/11/16 0302 06/16/16 2159   06/10/16 2230  levofloxacin (LEVAQUIN) IVPB 750 mg     750 mg 100 mL/hr over 90 Minutes Intravenous  Once 06/10/16 2218 06/11/16 0044       Objective: Vitals:   06/14/16 0438 06/14/16 0524 06/14/16 0859 06/14/16 0908  BP:  (!) 184/73    Pulse:  64    Resp:  18    Temp:  97.7 F (36.5 C)    TempSrc:  Oral    SpO2:  98% 99% 98%  Weight: 88 kg (193 lb 14.4 oz) 85.7 kg (189 lb)    Height:        Intake/Output Summary (Last 24 hours) at 06/14/16 1153 Last data filed at 06/14/16 0930  Gross per 24 hour  Intake              390 ml  Output             1150 ml  Net             -760 ml   Filed Weights   06/13/16 0500 06/14/16 0438 06/14/16 0524  Weight: 86 kg (189 lb 8 oz) 88 kg (193 lb 14.4 oz) 85.7 kg (189 lb)    Examination: General exam: Appears comfortable  HEENT: PERRLA, oral  mucosa moist, no sclera icterus or thrush Respiratory system: + wheezing- no rhonchi today, on room air. Respiratory effort normal. Cardiovascular system: S1 & S2 heard, RRR.  No murmurs  Gastrointestinal system: Abdomen soft, non-tender, nondistended. Normal bowel sound. No organomegaly Central nervous system: Alert and oriented. No focal neurological deficits. Extremities: No cyanosis, clubbing - 2 + edema Skin: No rashes or ulcers Psychiatry:  Mood & affect appropriate.     Data Reviewed: I have personally reviewed following labs and imaging studies  CBC:  Recent Labs Lab 06/10/16 2130 06/11/16 0543 06/11/16 1836 06/12/16 0558 06/13/16 0541  WBC 27.4* 31.3* 25.2* 19.8* 13.1*  NEUTROABS 21.6* 29.7*  --   --   --   HGB 12.4 11.4* 10.7* 10.1* 10.1*  HCT 37.0 34.2* 32.7* 31.0* 31.0*  MCV 91.1 92.2 92.1 92.3  93.1  PLT 226 189 219 206 219   Basic Metabolic Panel:  Recent Labs Lab 06/10/16 2130 06/11/16 0543 06/12/16 0558 06/13/16 0541  NA 133* 134* 136 137  K 4.3 4.5 3.6 3.9  CL 94* 100* 103 103  CO2 26 22 25 26   GLUCOSE 109* 169* 132* 88  BUN 11 10 13 15   CREATININE 0.83 0.75 0.71 0.81  CALCIUM 9.0 8.3* 8.6* 8.9   GFR: Estimated Creatinine Clearance: 57.5 mL/min (by C-G formula based on SCr of 0.81 mg/dL). Liver Function Tests:  Recent Labs Lab 06/11/16 0543  AST 18  ALT 17  ALKPHOS 75  BILITOT 0.4  PROT 6.4*  ALBUMIN 3.2*   No results for input(s): LIPASE, AMYLASE in the last 168 hours. No results for input(s): AMMONIA in the last 168 hours. Coagulation Profile:  Recent Labs Lab 06/11/16 0543  INR 1.16   Cardiac Enzymes:  Recent Labs Lab 06/11/16 0543 06/11/16 1117 06/11/16 1553  TROPONINI <0.03 <0.03 0.04*   BNP (last 3 results) No results for input(s): PROBNP in the last 8760 hours. HbA1C: No results for input(s): HGBA1C in the last 72 hours. CBG: No results for input(s): GLUCAP in the last 168 hours. Lipid Profile: No results for input(s): CHOL, HDL, LDLCALC, TRIG, CHOLHDL, LDLDIRECT in the last 72 hours. Thyroid Function Tests: No results for input(s): TSH, T4TOTAL, FREET4, T3FREE, THYROIDAB in the last 72 hours. Anemia Panel: No results for input(s): VITAMINB12, FOLATE, FERRITIN, TIBC, IRON, RETICCTPCT in the last 72 hours. Urine analysis:    Component Value Date/Time   COLORURINE STRAW (A) 06/11/2016 0004   APPEARANCEUR CLEAR 06/11/2016 0004   LABSPEC 1.006 06/11/2016 0004   PHURINE 8.0 06/11/2016 0004   GLUCOSEU NEGATIVE 06/11/2016 0004   HGBUR NEGATIVE 06/11/2016 0004   BILIRUBINUR NEGATIVE 06/11/2016 0004   KETONESUR NEGATIVE 06/11/2016 0004   PROTEINUR NEGATIVE 06/11/2016 0004   UROBILINOGEN 0.2 09/19/2014 1633   NITRITE NEGATIVE 06/11/2016 0004   LEUKOCYTESUR SMALL (A) 06/11/2016 0004   Sepsis  Labs: @LABRCNTIP (procalcitonin:4,lacticidven:4) ) Recent Results (from the past 240 hour(s))  Blood Culture (routine x 2)     Status: None (Preliminary result)   Collection Time: 06/10/16 10:50 PM  Result Value Ref Range Status   Specimen Description BLOOD RIGHT HAND  Final   Special Requests BOTTLES DRAWN AEROBIC AND ANAEROBIC 5CC EA  Final   Culture NO GROWTH 3 DAYS  Final   Report Status PENDING  Incomplete  Blood Culture (routine x 2)     Status: None (Preliminary result)   Collection Time: 06/10/16 10:55 PM  Result Value Ref Range Status   Specimen Description BLOOD LEFT HAND  Final  Special Requests BOTTLES DRAWN AEROBIC AND ANAEROBIC 5CC EA  Final   Culture NO GROWTH 3 DAYS  Final   Report Status PENDING  Incomplete  Urine culture     Status: Abnormal   Collection Time: 06/11/16 12:04 AM  Result Value Ref Range Status   Specimen Description URINE, RANDOM  Final   Special Requests NONE  Final   Culture <10,000 COLONIES/mL INSIGNIFICANT GROWTH (A)  Final   Report Status 06/12/2016 FINAL  Final  MRSA PCR Screening     Status: None   Collection Time: 06/11/16  2:30 AM  Result Value Ref Range Status   MRSA by PCR NEGATIVE NEGATIVE Final    Comment:        The GeneXpert MRSA Assay (FDA approved for NASAL specimens only), is one component of a comprehensive MRSA colonization surveillance program. It is not intended to diagnose MRSA infection nor to guide or monitor treatment for MRSA infections.          Radiology Studies: Dg Chest Port 1 View  Result Date: 06/13/2016 CLINICAL DATA:  Wheezing.  Pneumonia, asthma. EXAM: PORTABLE CHEST 1 VIEW COMPARISON:  CT 06/11/2016, radiographs 1158. FINDINGS: Slight decreased size rounded opacity in the left perihilar lung with air bronchograms. Linear subsegmental atelectasis in the right mid lung, no new confluent airspace disease. Low lung volumes. No pleural fluid or pneumothorax. Unchanged heart size and mediastinal contours.  Stimulator battery pack projects over the left chest. IMPRESSION: Decreased size rounded left perihilar opacity. Recommend continued radiographic follow-up to resolution. Subsegmental atelectasis right mid lung. Electronically Signed   By: Rubye Oaks M.D.   On: 06/13/2016 06:01      Scheduled Meds: . acetylcysteine  4 mL Nebulization TID  . atorvastatin  40 mg Oral Daily  . budesonide (PULMICORT) nebulizer solution  0.25 mg Nebulization BID  . cholecalciferol  1,000 Units Oral Daily  . clopidogrel  75 mg Oral Daily  . donepezil  10 mg Oral QHS  . enoxaparin (LOVENOX) injection  40 mg Subcutaneous Q24H  . ferrous sulfate  325 mg Oral Q breakfast  . fluticasone  2 spray Each Nare Daily  . guaiFENesin  600 mg Oral BID  . ipratropium-albuterol  3 mL Nebulization Q6H  . irbesartan  300 mg Oral Daily  . labetalol  200 mg Oral BID  . lamoTRIgine  200 mg Oral BID  . levofloxacin (LEVAQUIN) IV  750 mg Intravenous Q24H  . loratadine  10 mg Oral Daily  . LORazepam  1 mg Oral Q12H  . magnesium oxide  400 mg Oral Daily  . montelukast  10 mg Oral QHS  . pantoprazole  40 mg Oral Daily  . predniSONE  40 mg Oral Q breakfast  . QUEtiapine  150 mg Oral BID  . senna-docusate  1 tablet Oral BID   Continuous Infusions:   LOS: 4 days    Time spent in minutes: 35    Taven Strite, MD [facility] Pager: [email or web address] Password [email or web address] 06/14/2016, 11:53 AM

## 2016-06-14 NOTE — Care Management Note (Signed)
Case Management Note  Patient Details  Name: Cindy Padilla MRN: [id number] Date of Birth: [birth year]  Subjective/Objective:           Admitted for sepsis with pneumonia,  history of hypertension, asthma, diastolic CHF. Pt states independent with ADL's  PTA. DME: walker, nebulizer.  Leonides SakeSusan Maines (Daughter)  Showing 1 of 1   [phone]      PCP: Ron ParkerSamuel Bowen  Action/Plan: Return to ALF when medically stable. CSW is aware and [facility]. CM will continue to monitor as needs presents.    Expected Discharge Date:                  Expected Discharge Plan:  Assisted Living / [facility]  In-House Referral:  Clinical Social Work  Discharge planning Services  CM Consult  Post Acute Care Choice:    Choice offered to:     DME Arranged:    DME Agency:     HH Arranged:    HH Agency:     Status of Service:  In process, will continue to follow  If discussed at Long Length of Stay Meetings, dates discussed:    Additional Comments:  Epifanio LeschesCole, Loleta Frommelt Hudson, RN 06/14/2016, 11:23 AM

## 2016-06-15 DIAGNOSIS — J9601 Acute respiratory failure with hypoxia: Secondary | ICD-10-CM

## 2016-06-15 DIAGNOSIS — K439 Ventral hernia without obstruction or gangrene: Secondary | ICD-10-CM

## 2016-06-15 DIAGNOSIS — J13 Pneumonia due to Streptococcus pneumoniae: Secondary | ICD-10-CM

## 2016-06-15 LAB — CBC
HCT: 34.1 % — ABNORMAL LOW (ref 36.0–46.0)
HEMOGLOBIN: 10.9 g/dL — AB (ref 12.0–15.0)
MCH: 29.6 pg (ref 26.0–34.0)
MCHC: 32 g/dL (ref 30.0–36.0)
MCV: 92.7 fL (ref 78.0–100.0)
PLATELETS: 259 10*3/uL (ref 150–400)
RBC: 3.68 MIL/uL — ABNORMAL LOW (ref 3.87–5.11)
RDW: 15.4 % (ref 11.5–15.5)
WBC: 11 10*3/uL — AB (ref 4.0–10.5)

## 2016-06-15 LAB — CULTURE, BLOOD (ROUTINE X 2)
CULTURE: NO GROWTH
Culture: NO GROWTH

## 2016-06-15 MED ORDER — IPRATROPIUM-ALBUTEROL 0.5-2.5 (3) MG/3ML IN SOLN
3.0000 mL | Freq: Three times a day (TID) | RESPIRATORY_TRACT | Status: DC
  Administered 2016-06-15 – 2016-06-16 (×4): 3 mL via RESPIRATORY_TRACT
  Filled 2016-06-15 (×4): qty 3

## 2016-06-15 MED ORDER — LEVOFLOXACIN 750 MG PO TABS: 750.0000 mg | ORAL_TABLET | Freq: Every day | ORAL | Status: AC

## 2016-06-15 MED ORDER — BUDESONIDE 0.25 MG/2ML IN SUSP: 0.2500 mg | Freq: Two times a day (BID) | RESPIRATORY_TRACT | 12 refills | Status: DC

## 2016-06-15 MED ORDER — LORAZEPAM 1 MG PO TABS: 1.0000 mg | ORAL_TABLET | Freq: Two times a day (BID) | ORAL | 0 refills | Status: DC

## 2016-06-15 MED ORDER — PREDNISONE 20 MG PO TABS: 20.0000 mg | ORAL_TABLET | Freq: Every day | ORAL | Status: DC

## 2016-06-15 MED ORDER — LABETALOL HCL 200 MG PO TABS: 200.0000 mg | ORAL_TABLET | Freq: Two times a day (BID) | ORAL | Status: DC

## 2016-06-15 MED ORDER — GUAIFENESIN ER 600 MG PO TB12: 600.0000 mg | ORAL_TABLET | Freq: Two times a day (BID) | ORAL | Status: DC

## 2016-06-15 NOTE — NC FL2 (Signed)
[place] MEDICAID FL2 LEVEL OF CARE SCREENING TOOL     IDENTIFICATION  Patient Name: Cindy Padilla Birthdate: [birth year] Sex: female Admission Date (Current Location): 06/10/2016  [facility] and [place] Number:  [place] television[place]film[place]video and Address:  [facility], [address] [place], [place] [address]      Provider Number: [id number]  Attending Physician Name and Address:  [name], MD  Relative Name and Phone Number:       Current Level of Care: Hospital Recommended Level of Care: Assisted Living Facility Prior Approval Number:    Date Approved/Denied:   PASRR Number:    Discharge Plan: Other (Comment) (Assisted Living)    Current Diagnoses: Patient Active Problem List   Diagnosis Date Noted  . Streptococcus pneumoniae pneumonia (HCC) 06/15/2016  . Sepsis (HCC) 06/10/2016  . Morbid obesity (HCC) 04/14/2015  . Upper airway cough syndrome 04/14/2015  . Ventral hernia 04/24/2014  . Constipation 04/24/2014  . Abdominal pain, epigastric 04/24/2014  . Partial small bowel obstruction 04/22/2014  . Hemorrhoids 04/22/2014  . Abnormality of gait 09/30/2013  . 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 Diastolic heart failure (HCC) 07/27/2013  . Dementia with behavioral disturbance 07/27/2013  . HCAP (healthcare-associated pneumonia) 07/24/2013  . Acute respiratory failure (HCC) 02/21/2013  . Weakness 02/09/2013  . Hypokalemia 02/09/2013  . Hyponatremia 02/09/2013  . SOB (shortness of breath) 02/09/2013  . HTN (hypertension) 02/09/2013  . Other and unspecified hyperlipidemia 02/09/2013    Orientation RESPIRATION BLADDER Height & Weight     Self, Time, Situation, Place  Normal Continent Weight: 188 lb (85.3 kg) Height:  5' (152.4 cm)  BEHAVIORAL SYMPTOMS/MOOD NEUROLOGICAL BOWEL NUTRITION STATUS      Continent Diet (Regular)  AMBULATORY STATUS COMMUNICATION OF NEEDS Skin   Supervision Verbally Other  (Comment) (Wound on leg)                       Personal Care Assistance Level of Assistance  Bathing, Feeding, Dressing Bathing Assistance: Limited assistance Feeding assistance: Independent Dressing Assistance: Independent     Functional Limitations Info             SPECIAL CARE FACTORS FREQUENCY                       Contractures      Additional Factors Info  Code Status, Allergies, Psychotropic Code Status Info: DNR Allergies Info:  Aminoglycosides, Aspirin, Codeine, Ivp Dye Iodinated Diagnostic Agents, Morphine And Related, Neomycin, Penicillins, Phenothiazines, Promethazine, Sulfa Antibiotics, Tetracyclines & Related Psychotropic Info: Seroquel         Current Medications (06/15/2016):  This is the current hospital active medication list Current Facility-Administered Medications  Medication Dose Route Frequency Provider Last Rate Last Dose  . acetaminophen (TYLENOL) tablet 650 mg  650 mg Oral Q6H PRN Eduard Clos, MD       Or  . acetaminophen (TYLENOL) suppository 650 mg  650 mg Rectal Q6H PRN Eduard Clos, MD      . acetylcysteine (MUCOMYST) 20 % nebulizer / oral solution 4 mL  4 mL Nebulization TID [name], MD   4 mL at 06/15/16 0939  . albuterol (PROVENTIL) (2.5 MG/3ML) 0.083% nebulizer solution 2.5 mg  2.5 mg Nebulization Q4H PRN Nishant Dhungel, MD   2.5 mg at 06/12/16 2316  . antiseptic oral rinse (BIOTENE) solution 15 mL  15 mL Mouth Rinse PRN [name], MD      .  atorvastatin (LIPITOR) tablet 40 mg  40 mg Oral Daily Eduard Clos, MD   40 mg at 06/15/16 1050  . budesonide (PULMICORT) nebulizer solution 0.25 mg  0.25 mg Nebulization BID Eduard Clos, MD   0.25 mg at 06/15/16 0945  . cholecalciferol (VITAMIN D) tablet 1,000 Units  1,000 Units Oral Daily Eduard Clos, MD   1,000 Units at 06/15/16 1051  . clopidogrel (PLAVIX) tablet 75 mg  75 mg Oral Daily Eduard Clos, MD   75 mg at 06/15/16 1050  .  donepezil (ARICEPT) tablet 10 mg  10 mg Oral QHS Eduard Clos, MD   10 mg at 06/14/16 2126  . enoxaparin (LOVENOX) injection 40 mg  40 mg Subcutaneous Q24H Eduard Clos, MD   40 mg at 06/14/16 1421  . ferrous sulfate tablet 325 mg  325 mg Oral Q breakfast Eduard Clos, MD   325 mg at 06/15/16 0912  . fluticasone (FLONASE) 50 MCG/ACT nasal spray 2 spray  2 spray Each Nare Daily Eduard Clos, MD   2 spray at 06/14/16 0933  . guaiFENesin (MUCINEX) 12 hr tablet 600 mg  600 mg Oral BID Nishant Dhungel, MD   600 mg at 06/15/16 1051  . guaiFENesin (ROBITUSSIN) 100 MG/5ML solution 200 mg  200 mg Oral TID PRN Eduard Clos, MD   200 mg at 06/13/16 0831  . ipratropium-albuterol (DUONEB) 0.5-2.5 (3) MG/3ML nebulizer solution 3 mL  3 mL Nebulization Q2H PRN Eduard Clos, MD   3 mL at 06/13/16 0500  . ipratropium-albuterol (DUONEB) 0.5-2.5 (3) MG/3ML nebulizer solution 3 mL  3 mL Nebulization TID [name], MD      . irbesartan (AVAPRO) tablet 300 mg  300 mg Oral Daily Eduard Clos, MD   300 mg at 06/15/16 1050  . labetalol (NORMODYNE) tablet 200 mg  200 mg Oral BID [name], MD   200 mg at 06/15/16 1050  . lamoTRIgine (LAMICTAL) tablet 200 mg  200 mg Oral BID Eduard Clos, MD   200 mg at 06/15/16 1051  . levofloxacin (LEVAQUIN) IVPB 750 mg  750 mg Intravenous Q24H Eduard Clos, MD   750 mg at 06/14/16 2124  . loratadine (CLARITIN) tablet 10 mg  10 mg Oral Daily Eduard Clos, MD   10 mg at 06/15/16 1051  . LORazepam (ATIVAN) tablet 1 mg  1 mg Oral Q12H Eduard Clos, MD   1 mg at 06/15/16 1050  . magnesium oxide (MAG-OX) tablet 400 mg  400 mg Oral Daily Eduard Clos, MD   400 mg at 06/15/16 1051  . montelukast (SINGULAIR) tablet 10 mg  10 mg Oral QHS Eduard Clos, MD   10 mg at 06/14/16 2125  . ondansetron (ZOFRAN) tablet 4 mg  4 mg Oral Q6H PRN Eduard Clos, MD       Or  . ondansetron [facility]) injection 4 mg   4 mg Intravenous Q6H PRN Eduard Clos, MD      . pantoprazole (PROTONIX) EC tablet 40 mg  40 mg Oral Daily Eduard Clos, MD   40 mg at 06/15/16 1050  . predniSONE (DELTASONE) tablet 40 mg  40 mg Oral Q breakfast [name], MD   40 mg at 06/15/16 0912  . QUEtiapine (SEROQUEL) tablet 150 mg  150 mg Oral BID Eduard Clos, MD   150 mg at 06/15/16 1050  . senna-docusate (Senokot-S) tablet 1 tablet  1  tablet Oral BID Eduard ClosArshad N Kakrakandy, MD   1 tablet at 06/15/16 1051     Discharge Medications: Please see discharge summary for a list of discharge medications.  Relevant Imaging Results:  Relevant Lab Results:   Additional Information SSN [id number]  Emerly Prak, Joyice FasterJessica Lynn, LCSW

## 2016-06-15 NOTE — Clinical Social Work Note (Signed)
Clinical Social Worker continuing to follow patient and family for support and [facility].  CSW spoke with administrator who states that patient medication will not be available until Sunday evening.  Facility states that patient can admit late Sunday afternoon.  CSW updated MD of current barrier to discharge.  CSW to facilitate patient discharge on Sunday.  CSW remains available for support.  Macario GoldsJesse Sabina [place]vers, [place] [phone]

## 2016-06-15 NOTE — NC FL2 (Signed)
[place] MEDICAID FL2 LEVEL OF CARE SCREENING TOOL     IDENTIFICATION  Patient Name: Cindy Padilla Birthdate: Jan 01, 1941 Sex: female Admission Date (Current Location): 06/10/2016  [facility] [place]  [place]   Facility and Address:  [facility] [place], [address], [place], [place] [address]      Provider Number: [id number]  Attending Physician Name and Address:  Calvert CantorSaima Rizwan, MD  Relative Name and Phone Number:       Current Level of Care: Hospital Recommended Level of Care: Memory Care Prior Approval Number:    Date Approved/Denied:   PASRR Number:    Discharge Plan: Other (Comment) (Memory Care)    Current Diagnoses: Patient Active Problem List   Diagnosis Date Noted  . Streptococcus pneumoniae pneumonia (HCC) 06/15/2016  . Sepsis (HCC) 06/10/2016  . Morbid obesity (HCC) 04/14/2015  . Upper airway cough syndrome 04/14/2015  . Ventral hernia 04/24/2014  . Constipation 04/24/2014  . Abdominal pain, epigastric 04/24/2014  . Partial small bowel obstruction 04/22/2014  . Hemorrhoids 04/22/2014  . Abnormality of gait 09/30/2013  . 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 Diastolic heart failure (HCC) 07/27/2013  . Dementia with behavioral disturbance 07/27/2013  . HCAP (healthcare-associated pneumonia) 07/24/2013  . Acute respiratory failure (HCC) 02/21/2013  . Weakness 02/09/2013  . Hypokalemia 02/09/2013  . Hyponatremia 02/09/2013  . SOB (shortness of breath) 02/09/2013  . HTN (hypertension) 02/09/2013  . Other and unspecified hyperlipidemia 02/09/2013    Orientation RESPIRATION BLADDER Height & Weight     Self, Time, Situation, Place  Normal Continent Weight: 188 lb (85.3 kg) Height:  5' (152.4 cm)  BEHAVIORAL SYMPTOMS/MOOD NEUROLOGICAL BOWEL NUTRITION STATUS      Continent Diet (Regular)  AMBULATORY STATUS COMMUNICATION OF NEEDS Skin   Supervision Verbally Other (Comment) (Wound on  leg)                       Personal Care Assistance Level of Assistance  Bathing, Feeding, Dressing Bathing Assistance: Limited assistance Feeding assistance: Independent Dressing Assistance: Independent     Functional Limitations Info             SPECIAL CARE FACTORS FREQUENCY                       Contractures      Additional Factors Info  Code Status, Allergies, Psychotropic Code Status Info: DNR Allergies Info:  Aminoglycosides, Aspirin, Codeine, Ivp Dye Iodinated Diagnostic Agents, Morphine And Related, Neomycin, Penicillins, Phenothiazines, Promethazine, Sulfa Antibiotics, Tetracyclines & Related Psychotropic Info: Seroquel         Current Medications (06/15/2016):  This is the current hospital active medication list Current Facility-Administered Medications  Medication Dose Route Frequency Provider Last Rate Last Dose  . acetaminophen (TYLENOL) tablet 650 mg  650 mg Oral Q6H PRN Eduard ClosArshad N Kakrakandy, MD       Or  . acetaminophen (TYLENOL) suppository 650 mg  650 mg Rectal Q6H PRN Eduard ClosArshad N Kakrakandy, MD      . acetylcysteine (MUCOMYST) 20 % nebulizer / oral solution 4 mL  4 mL Nebulization TID Calvert CantorSaima Rizwan, MD   4 mL at 06/15/16 0939  . albuterol (PROVENTIL) (2.5 MG/3ML) 0.083% nebulizer solution 2.5 mg  2.5 mg Nebulization Q4H PRN Nishant Dhungel, MD   2.5 mg at 06/12/16 2316  . antiseptic oral rinse (BIOTENE) solution 15 mL  15 mL Mouth Rinse PRN Calvert CantorSaima Rizwan, MD      .  atorvastatin (LIPITOR) tablet 40 mg  40 mg Oral Daily Eduard Clos, MD   40 mg at 06/15/16 1050  . budesonide (PULMICORT) nebulizer solution 0.25 mg  0.25 mg Nebulization BID Eduard Clos, MD   0.25 mg at 06/15/16 0945  . cholecalciferol (VITAMIN D) tablet 1,000 Units  1,000 Units Oral Daily Eduard Clos, MD   1,000 Units at 06/15/16 1051  . clopidogrel (PLAVIX) tablet 75 mg  75 mg Oral Daily Eduard Clos, MD   75 mg at 06/15/16 1050  . donepezil (ARICEPT)  tablet 10 mg  10 mg Oral QHS Eduard Clos, MD   10 mg at 06/14/16 2126  . enoxaparin (LOVENOX) injection 40 mg  40 mg Subcutaneous Q24H Eduard Clos, MD   40 mg at 06/15/16 1311  . ferrous sulfate tablet 325 mg  325 mg Oral Q breakfast Eduard Clos, MD   325 mg at 06/15/16 0912  . fluticasone (FLONASE) 50 MCG/ACT nasal spray 2 spray  2 spray Each Nare Daily Eduard Clos, MD   2 spray at 06/15/16 1311  . guaiFENesin (MUCINEX) 12 hr tablet 600 mg  600 mg Oral BID Nishant Dhungel, MD   600 mg at 06/15/16 1051  . guaiFENesin (ROBITUSSIN) 100 MG/5ML solution 200 mg  200 mg Oral TID PRN Eduard Clos, MD   200 mg at 06/13/16 0831  . ipratropium-albuterol (DUONEB) 0.5-2.5 (3) MG/3ML nebulizer solution 3 mL  3 mL Nebulization Q2H PRN Eduard Clos, MD   3 mL at 06/13/16 0500  . ipratropium-albuterol (DUONEB) 0.5-2.5 (3) MG/3ML nebulizer solution 3 mL  3 mL Nebulization TID [name], MD      . irbesartan (AVAPRO) tablet 300 mg  300 mg Oral Daily Eduard Clos, MD   300 mg at 06/15/16 1050  . labetalol (NORMODYNE) tablet 200 mg  200 mg Oral BID [name], MD   200 mg at 06/15/16 1050  . lamoTRIgine (LAMICTAL) tablet 200 mg  200 mg Oral BID Eduard Clos, MD   200 mg at 06/15/16 1051  . levofloxacin (LEVAQUIN) IVPB 750 mg  750 mg Intravenous Q24H Eduard Clos, MD   750 mg at 06/14/16 2124  . loratadine (CLARITIN) tablet 10 mg  10 mg Oral Daily Eduard Clos, MD   10 mg at 06/15/16 1051  . LORazepam (ATIVAN) tablet 1 mg  1 mg Oral Q12H Eduard Clos, MD   1 mg at 06/15/16 1050  . magnesium oxide (MAG-OX) tablet 400 mg  400 mg Oral Daily Eduard Clos, MD   400 mg at 06/15/16 1051  . montelukast (SINGULAIR) tablet 10 mg  10 mg Oral QHS Eduard Clos, MD   10 mg at 06/14/16 2125  . ondansetron (ZOFRAN) tablet 4 mg  4 mg Oral Q6H PRN Eduard Clos, MD       Or  . ondansetron [facility]) injection 4 mg  4 mg Intravenous  Q6H PRN Eduard Clos, MD      . pantoprazole (PROTONIX) EC tablet 40 mg  40 mg Oral Daily Eduard Clos, MD   40 mg at 06/15/16 1050  . predniSONE (DELTASONE) tablet 40 mg  40 mg Oral Q breakfast [name], MD   40 mg at 06/15/16 0912  . QUEtiapine (SEROQUEL) tablet 150 mg  150 mg Oral BID Eduard Clos, MD   150 mg at 06/15/16 1050  . senna-docusate (Senokot-S) tablet 1 tablet  1  tablet Oral BID Eduard Clos, MD   1 tablet at 06/15/16 1051     Discharge Medications: Please see discharge summary for a list of discharge medications.  Relevant Imaging Results:  Relevant Lab Results:   Additional Information SSN [id number]   Macario Golds, [place] [phone]

## 2016-06-15 NOTE — Discharge Summary (Addendum)
Physician Discharge Summary  DESAREA OHAGAN ZOX:[id number] DOB: [birth year] DOA: 06/10/2016  PCP: Ron Parker, MD  Admit date: 06/10/2016 Discharge date: 06/16/2016  Admitted From: home  Disposition:  SNF   Recommendations for Outpatient Follow-up:  1. Cont nebs, Mucinex, flutter valve and IS Discharge Condition:  stable CODE STATUS:  DNR   Diet recommendation:  Heart healthy Consultations:      Discharge Diagnoses:  Principal Problem:   Streptococcus pneumoniae pneumonia (HCC) Active Problems:   Acute respiratory failure (HCC)   Sepsis (HCC)   HTN (hypertension)   Diastolic heart failure (HCC)   Intrinsic asthma   Ventral hernia   Morbid obesity (HCC)    Subjective: Cough has improved, much less congested today  Brief Summary: 76 year old female with history of hypertension, asthma, diastolic CHF and dementia who is a resident of independent living was brought to the ED with persistent shortness of breath and productive cough. Symptoms started a week back with sinus symptoms gradually progressing to dyspnea and wheezing associated with some chest tightness. In the ED shows found to be wheezing, tachypneic, febrile 102F and hypoxic. Neck slight chest x-ray showing left upper lobe consolidation with elevated lactate and significant leukocytosis (31K) Patient admitted for sepsis with pneumonia.  Hospital Course:  Principal Problem:  Severe Sepsis/ Strep CAP (community acquired pneumonia) with underlying asthma - LUL infiltrate - positive strep antigen - note to have severe congestion   - recommend to continue Levaquin for total of 7 days- stop after last dose on 1/22 - added steroids and Nebs for wheezing - cont to taper Prednisone over next 3 days - subsequently added flutter valve, Mucomyst inhalations and chest PT due to severe congestion - would recommend to continue Nebs and Mucinex, Flutter valve and IS at SNF routinely for now - Lactic acidosis has  resolved - leukocytosis improving- 31 >> 13.1  Active Problems: Essential hypertension -Stable  -Continue Diovan  - noted to be on 2 CCB - have stopped both Norvasc and Verapamil due to severe pedal edema- replaced with Labetalol   - holding Lasix due to dehydration  Chronic diastolic CHF - grade 1 per last ECHO in 9/14  Mildly elevated Troponin - one troponin out of 3 noted to be elevated at 0.04 - likely due to respiratory issues - will not order further work up  B/l pitting edema -  seems to be chronic - noted to be on both Norvasc and Verapamil - will stopped both and replaced with Labetalol  - no increase in bronchospasm noted - TEDs, elevate  Chronic liver cysts and ventral hernia Seems to be unchanged   Dementia - takes Aricept, Seroquel,  Ativan BID   Discharge Instructions   Allergies as of 06/16/2016      Reactions   Aminoglycosides Other (See Comments)   Unknown reaction (per [facility])   Aspirin Other (See Comments)   Unknown reaction (per MAR)   Codeine Other (See Comments)   Unknown reaction (per MAR)   Ivp Dye [iodinated Diagnostic Agents] Other (See Comments)   Unknown reaction (per MAR)   Morphine And Related Other (See Comments)   Unknown reaction (per MAR)   Neomycin Other (See Comments)   Unknown reaction (per MAR)   Penicillins Rash   Phenothiazines Other (See Comments)   Unknown reaction (per MAR)   Promethazine Other (See Comments)   unknown   Sulfa Antibiotics Other (See Comments)   Unknown reaction (per MAR)   Tetracyclines & Related Other (See Comments)  Unknown reaction (per [place])      Medication List    STOP taking these medications   guaiFENesin 100 MG/5ML liquid Commonly known as:  ROBITUSSIN Replaced by:  guaiFENesin 600 MG 12 hr tablet   ipratropium 0.02 % nebulizer solution Commonly known as:  ATROVENT   methylPREDNISolone 4 MG Tbpk tablet Commonly known as:  MEDROL DOSEPAK   verapamil 120 MG CR tablet Commonly  known as:  CALAN-SR     TAKE these medications   acetaminophen 325 MG tablet Commonly known as:  TYLENOL Take 650 mg by mouth 3 (three) times daily. What changed:  Another medication with the same name was removed. Continue taking this medication, and follow the directions you see here.   albuterol 108 (90 Base) MCG/ACT inhaler Commonly known as:  PROVENTIL HFA;VENTOLIN HFA Inhale 2 puffs into the lungs every 4 (four) hours as needed for wheezing or shortness of breath.   amLODipine 5 MG tablet Commonly known as:  NORVASC Take 5 mg by mouth daily.   antiseptic oral rinse Liqd 15 mLs by Mouth Rinse route at bedtime. Swish and spit   ASPERCREME W/LIDOCAINE 4 % cream Generic drug:  lidocaine Apply 1 application topically 3 (three) times daily as needed (pain).   atorvastatin 40 MG tablet Commonly known as:  LIPITOR Take 40 mg by mouth daily.   budesonide 0.25 MG/2ML nebulizer solution Commonly known as:  PULMICORT Take 2 mLs (0.25 mg total) by nebulization 2 (two) times daily.   budesonide-formoterol 160-4.5 MCG/ACT inhaler Commonly known as:  SYMBICORT Inhale 2 puffs into the lungs 2 (two) times daily. Rinse mouth after use   cholecalciferol 1000 units tablet Commonly known as:  VITAMIN D Take 1,000 Units by mouth daily.   clopidogrel 75 MG tablet Commonly known as:  PLAVIX Take 75 mg by mouth daily.   donepezil 10 MG tablet Commonly known as:  ARICEPT Take 10 mg by mouth at bedtime.   esomeprazole 20 MG capsule Commonly known as:  NEXIUM Take 20 mg by mouth daily at 12 noon.   ferrous sulfate 325 (65 FE) MG tablet Take 325 mg by mouth daily with breakfast.   fexofenadine 180 MG tablet Commonly known as:  ALLEGRA Take 180 mg by mouth daily.   fluticasone 50 MCG/ACT nasal spray Commonly known as:  FLONASE Place 2 sprays into both nostrils daily.   furosemide 20 MG tablet Commonly known as:  LASIX Take 20 mg by mouth daily with breakfast.   GERI-LANTA  200-200-20 MG/5ML suspension Generic drug:  alum & mag hydroxide-simeth Take 30 mLs by mouth See admin instructions. Give 1 hour after meals and at bedtime as needed for indigestion   guaiFENesin 600 MG 12 hr tablet Commonly known as:  MUCINEX Take 1 tablet (600 mg total) by mouth 2 (two) times daily. Replaces:  guaiFENesin 100 MG/5ML liquid   ipratropium-albuterol 0.5-2.5 (3) MG/3ML Soln Commonly known as:  DUONEB Take 3 mLs by nebulization every 6 (six) hours.   lamoTRIgine 200 MG tablet Commonly known as:  LAMICTAL Take 200 mg by mouth 2 (two) times daily.   levofloxacin 750 MG tablet Commonly known as:  LEVAQUIN Take 1 tablet (750 mg total) by mouth daily.   loperamide 2 MG capsule Commonly known as:  IMODIUM Take 2 mg by mouth as needed for diarrhea or loose stools. Do not exceed 8 doses in 24 hours   LORazepam 1 MG tablet Commonly known as:  ATIVAN Take 1 tablet (1 mg total) by  mouth every 12 (twelve) hours.   magnesium hydroxide 400 MG/5ML suspension Commonly known as:  MILK OF MAGNESIA Take 30 mLs by mouth at bedtime as needed for mild constipation.   magnesium oxide 400 MG tablet Commonly known as:  MAG-OX Take 400 mg by mouth daily.   montelukast 10 MG tablet Commonly known as:  SINGULAIR Take 10 mg by mouth at bedtime.   phenylephrine-shark liver oil-mineral oil-petrolatum 0.25-3-14-71.9 % rectal ointment Commonly known as:  PREPARATION H Place 1 application rectally 2 (two) times daily as needed for hemorrhoids.   predniSONE 20 MG tablet Commonly known as:  DELTASONE Take 1 tablet (20 mg total) by mouth daily with breakfast. 20 mg tomorrow, 10 mg the next day, 5 mg the last day   QUEtiapine 100 MG tablet Commonly known as:  SEROQUEL Take 150 mg by mouth 2 (two) times daily. Hold for sedation   ranitidine 150 MG tablet Commonly known as:  ZANTAC Take 150 mg by mouth 2 (two) times daily.   senna-docusate 8.6-50 MG tablet Commonly known as:   Senokot-S Take 1 tablet by mouth 2 (two) times daily.   triamcinolone cream 0.1 % Commonly known as:  KENALOG Apply 1 application topically daily as needed (irritation).   valsartan 320 MG tablet Commonly known as:  DIOVAN Take 1 tablet (320 mg total) by mouth daily.   vitamin A & D ointment Apply 1 application topically every 6 (six) hours as needed (rectal pain).       Allergies  Allergen Reactions  . Aminoglycosides Other (See Comments)    Unknown reaction (per [facility])  . Aspirin Other (See Comments)    Unknown reaction (per MAR)  . Codeine Other (See Comments)    Unknown reaction (per [facility])  . Ivp Dye [Iodinated Diagnostic Agents] Other (See Comments)    Unknown reaction (per [facility])  . Morphine And Related Other (See Comments)    Unknown reaction (per MAR)  . Neomycin Other (See Comments)    Unknown reaction (per MAR)  . Penicillins Rash  . Phenothiazines Other (See Comments)    Unknown reaction (per [facility])  . Promethazine Other (See Comments)    unknown  . Sulfa Antibiotics Other (See Comments)    Unknown reaction (per [facility])  . Tetracyclines & Related Other (See Comments)    Unknown reaction (per [facility])     Procedures/Studies:   Dg Chest 2 View  Result Date: 06/10/2016 CLINICAL DATA:  Cough, congestion, fever, wheezing for 1 week. History of COPD, hypertension. EXAM: CHEST  2 VIEW COMPARISON:  CT chest March 01, 2016 FINDINGS: Approximate 4 cm masslike consolidation LEFT upper lobe. The cardiac silhouette is mildly enlarged. Mildly calcified aortic knob. Increased lung volumes. Mildly elevated RIGHT hemidiaphragm. No pleural effusion. No pneumothorax. Vagal stimulator LEFT chest. Osseous structures are nonsuspicious. IMPRESSION: Masslike consolidation LEFT upper lobe, recommend CT chest with contrast. Mild cardiomegaly. Electronically Signed   By: Awilda Metro M.D.   On: 06/10/2016 22:21   Ct Chest W Contrast  Result Date: 06/11/2016 CLINICAL DATA:  Shortness of  breath with cough and weakness. Abnormal chest radiograph EXAM: CT CHEST WITH CONTRAST TECHNIQUE: Multidetector CT imaging of the chest was performed during intravenous contrast administration. CONTRAST:  75mL ISOVUE-300 IOPAMIDOL (ISOVUE-300) INJECTION 61% COMPARISON:  Chest CT March 01, 2016 and chest radiograph June 10, 2016 FINDINGS: Cardiovascular: There is no thoracic aortic aneurysm or dissection. There are scattered foci of calcification visualized great vessels as well as scattered foci of calcification in the  thoracic aorta. There are scattered foci of coronary artery calcification. Pericardium is not appreciably thickened. No major vessel pulmonary embolus evident. Mediastinum/Nodes: Visualized thyroid is within normal limits. There are a few subcentimeter mediastinal lymph nodes. There is a stable mildly prominent sub- carinal lymph node measuring 1.6 x 1.1 cm. No new lymph node prominence evident. Lungs/Pleura: There is focal consolidation in the posterior segment of the left upper lobe. There are air bronchograms extending throughout this area of consolidation. A well-defined underlying mass lesion is not evident in this area. Note that no mass lesion was seen in this area on CT examination approximately 3 months prior. There is patchy atelectatic change in the lung bases. There is slight scarring in the extreme lung apices. There is no pleural thickening or pleural effusion. Upper Abdomen: In the visualized upper abdomen, gallbladder is absent. There is stable prominence of the visualize common bile duct, measuring 12 mm. There are stable cystic areas in the liver, largest in the medial segment left lobe measuring 4.1 x 3.8 cm. There is atherosclerotic calcification in the aorta. 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 There is bowel extending into a ventral hernia, incompletely visualized. The visualized bowel in this area appears unremarkable. Musculoskeletal: There  are no blastic or lytic bone lesions. There is stable eventration of the right hemidiaphragm. IMPRESSION: Airspace consolidation containing air bronchograms in the posterior segment of the left upper lobe, felt to be consistent with pneumonia. No mass lesion is seen in this area. Follow-up imaging to clearing in this circumstance is advised to exclude the possibility of a small underlying lesion. Stable sub- carinal lymph node prominence. No new lymph node prominence compared to prior CT. Scattered foci of atherosclerotic calcification as well as foci of coronary artery calcification. Small calcified splenic artery aneurysm, not felt to be of clinical significance in this age group. Cystic areas in the liver, stable. Incomplete visualization of ventral hernia. Bowel which is visualized in this region appears unremarkable. Gallbladder absent. Visualized common bile duct prominent but stable. Electronically Signed   By: Bretta Bang III M.D.   On: 06/11/2016 11:22   Ct Abdomen Pelvis W Contrast  Result Date: 06/11/2016 CLINICAL DATA:  Patient admitted for sepsis and pneumonia. Fever. Difficulty breathing. Known abdominal hernia. EXAM: CT ABDOMEN AND PELVIS WITH CONTRAST TECHNIQUE: Multidetector CT imaging of the abdomen and pelvis was performed using the standard protocol following bolus administration of intravenous contrast. CONTRAST:  80mL ISOVUE-300 IOPAMIDOL (ISOVUE-300) INJECTION 61% COMPARISON:  CT abdomen dated 09/19/2014. CT abdomen dated 04/22/2014. FINDINGS: Lower chest: No acute abnormality. Hepatobiliary: Multiple hepatic cysts are not significantly changed in the interval. Status post cholecystectomy with associated mild bile duct ectasia. Pancreas: Unremarkable. No pancreatic ductal dilatation or surrounding inflammatory changes. Spleen: Normal in size without focal abnormality. Adrenals/Urinary Tract: Adrenal glands appear normal. Kidneys appear normal without mass, stone or hydronephrosis. No  ureteral or bladder calculi identified. Bladder appears normal. Stomach/Bowel: Chronic supraumbilical abdominal wall hernia contains a segment of the stomach body/antrum, not significantly changed in extent or configuration compared to the previous exams. No evidence of gastric obstruction or acute inflammation at the hernia site. No dilated large or small bowel loops. Scattered mild diverticulosis of the sigmoid colon without evidence of acute diverticulitis. Appendix is not convincingly seen but there are no inflammatory changes about the cecum to suggest acute appendicitis. Vascular/Lymphatic: Atherosclerotic changes of the normal caliber abdominal aorta. No enlarged lymph nodes seen. Reproductive: Status post hysterectomy.  No adnexal mass. Other: No free fluid or abscess collection within the abdomen or pelvis. No free intraperitoneal air. Musculoskeletal: Degenerative changes throughout the slightly scoliotic thoracolumbar spine. No acute or suspicious osseous finding. IMPRESSION: 1. No acute findings within the abdomen or pelvis. No free fluid or abscess. No free intraperitoneal air. No bowel obstruction or evidence of bowel wall inflammation. No evidence of acute solid organ abnormality. 2. Mild colonic diverticulosis without evidence of acute diverticulitis. 3. Midline supraumbilical abdominal wall hernia which contains a segment of the stomach body/antrum, not significantly changed in extent or configuration compared to multiple prior exams. No evidence of associated gastric obstruction. 4. Aortic atherosclerosis. 5. Additional chronic/incidental findings detailed above. Electronically Signed   By: Bary Richard M.D.   On: 06/11/2016 22:42   Dg Chest Port 1 View  Result Date: 06/13/2016 CLINICAL DATA:  Wheezing.  Pneumonia, asthma. EXAM: PORTABLE CHEST 1 VIEW COMPARISON:  CT 06/11/2016, radiographs 1158. FINDINGS: Slight decreased size rounded opacity in the left perihilar lung with air bronchograms.  Linear subsegmental atelectasis in the right mid lung, no new confluent airspace disease. Low lung volumes. No pleural fluid or pneumothorax. Unchanged heart size and mediastinal contours. Stimulator battery pack projects over the left chest. IMPRESSION: Decreased size rounded left perihilar opacity. Recommend continued radiographic follow-up to resolution. Subsegmental atelectasis right mid lung. Electronically Signed   By: Rubye Oaks M.D.   On: 06/13/2016 06:01       Discharge Exam: Vitals:   06/14/16 1424 06/15/16 0704  BP: 126/69 (!) 158/45  Pulse: (!) 56 (!) 53  Resp: 18 (!) 22  Temp: 99.4 F (37.4 C) 97.7 F (36.5 C)   Vitals:   06/14/16 1301 06/14/16 1424 06/15/16 0701 06/15/16 0704  BP:  126/69  (!) 158/45  Pulse:  (!) 56  (!) 53  Resp:  18  (!) 22  Temp:  99.4 F (37.4 C)  97.7 F (36.5 C)  TempSrc:  Oral  Oral  SpO2: 97% 92%  97%  Weight:   85.3 kg (188 lb)   Height:        General: Pt is alert, awake, not in acute distress Cardiovascular: RRR, S1/S2 +, no rubs, no gallops Respiratory: CTA bilaterally, no wheezing, no rhonchi Abdominal: Soft, NT, ND, bowel sounds + Extremities: no edema, no cyanosis    The results of significant diagnostics from this hospitalization (including imaging, microbiology, ancillary and laboratory) are listed below for reference.     Microbiology: Recent Results (from the past 240 hour(s))  Blood Culture (routine x 2)     Status: None (Preliminary result)   Collection Time: 06/10/16 10:50 PM  Result Value Ref Range Status   Specimen Description BLOOD RIGHT HAND  Final   Special Requests BOTTLES DRAWN AEROBIC AND ANAEROBIC 5CC EA  Final   Culture NO GROWTH 4 DAYS  Final   Report Status PENDING  Incomplete  Blood Culture (routine x 2)     Status: None (Preliminary result)   Collection Time: 06/10/16 10:55 PM  Result Value Ref Range Status   Specimen Description BLOOD LEFT HAND  Final   Special Requests BOTTLES DRAWN AEROBIC  AND ANAEROBIC 5CC EA  Final   Culture NO GROWTH 4 DAYS  Final   Report Status PENDING  Incomplete  Urine culture     Status: Abnormal   Collection Time: 06/11/16 12:04 AM  Result Value Ref Range Status   Specimen Description URINE, RANDOM  Final   Special Requests NONE  Final  Culture <10,000 COLONIES/mL INSIGNIFICANT GROWTH (A)  Final   Report Status 06/12/2016 FINAL  Final  MRSA PCR Screening     Status: None   Collection Time: 06/11/16  2:30 AM  Result Value Ref Range Status   MRSA by PCR NEGATIVE NEGATIVE Final    Comment:        The GeneXpert MRSA Assay (FDA approved for NASAL specimens only), is one component of a comprehensive MRSA colonization surveillance program. It is not intended to diagnose MRSA infection nor to guide or monitor treatment for MRSA infections.      Labs: BNP (last 3 results)  Recent Labs  09/29/15 0547 03/01/16 1622 06/10/16 2138  BNP 48.9 126.2* 70.1   Basic Metabolic Panel:  Recent Labs Lab 06/10/16 2130 06/11/16 0543 06/12/16 0558 06/13/16 0541  NA 133* 134* 136 137  K 4.3 4.5 3.6 3.9  CL 94* 100* 103 103  CO2 26 22 25 26   GLUCOSE 109* 169* 132* 88  BUN 11 10 13 15   CREATININE 0.83 0.75 0.71 0.81  CALCIUM 9.0 8.3* 8.6* 8.9   Liver Function Tests:  Recent Labs Lab 06/11/16 0543  AST 18  ALT 17  ALKPHOS 75  BILITOT 0.4  PROT 6.4*  ALBUMIN 3.2*   No results for input(s): LIPASE, AMYLASE in the last 168 hours. No results for input(s): AMMONIA in the last 168 hours. CBC:  Recent Labs Lab 06/10/16 2130 06/11/16 0543 06/11/16 1836 06/12/16 0558 06/13/16 0541  WBC 27.4* 31.3* 25.2* 19.8* 13.1*  NEUTROABS 21.6* 29.7*  --   --   --   HGB 12.4 11.4* 10.7* 10.1* 10.1*  HCT 37.0 34.2* 32.7* 31.0* 31.0*  MCV 91.1 92.2 92.1 92.3 93.1  PLT 226 189 219 206 219   Cardiac Enzymes:  Recent Labs Lab 06/11/16 0543 06/11/16 1117 06/11/16 1553  TROPONINI <0.03 <0.03 0.04*   BNP: Invalid input(s): POCBNP CBG: No  results for input(s): GLUCAP in the last 168 hours. D-Dimer No results for input(s): DDIMER in the last 72 hours. Hgb A1c No results for input(s): HGBA1C in the last 72 hours. Lipid Profile No results for input(s): CHOL, HDL, LDLCALC, TRIG, CHOLHDL, LDLDIRECT in the last 72 hours. Thyroid function studies No results for input(s): TSH, T4TOTAL, T3FREE, THYROIDAB in the last 72 hours.  Invalid input(s): FREET3 Anemia work up No results for input(s): VITAMINB12, FOLATE, FERRITIN, TIBC, IRON, RETICCTPCT in the last 72 hours. Urinalysis    Component Value Date/Time   COLORURINE STRAW (A) 06/11/2016 0004   APPEARANCEUR CLEAR 06/11/2016 0004   LABSPEC 1.006 06/11/2016 0004   PHURINE 8.0 06/11/2016 0004   GLUCOSEU NEGATIVE 06/11/2016 0004   HGBUR NEGATIVE 06/11/2016 0004   BILIRUBINUR NEGATIVE 06/11/2016 0004   KETONESUR NEGATIVE 06/11/2016 0004   PROTEINUR NEGATIVE 06/11/2016 0004   UROBILINOGEN 0.2 09/19/2014 1633   NITRITE NEGATIVE 06/11/2016 0004   LEUKOCYTESUR SMALL (A) 06/11/2016 0004   Sepsis Labs Invalid input(s): PROCALCITONIN,  WBC,  LACTICIDVEN Microbiology Recent Results (from the past 240 hour(s))  Blood Culture (routine x 2)     Status: None (Preliminary result)   Collection Time: 06/10/16 10:50 PM  Result Value Ref Range Status   Specimen Description BLOOD RIGHT HAND  Final   Special Requests BOTTLES DRAWN AEROBIC AND ANAEROBIC 5CC EA  Final   Culture NO GROWTH 4 DAYS  Final   Report Status PENDING  Incomplete  Blood Culture (routine x 2)     Status: None (Preliminary result)   Collection Time: 06/10/16  10:55 PM  Result Value Ref Range Status   Specimen Description BLOOD LEFT HAND  Final   Special Requests BOTTLES DRAWN AEROBIC AND ANAEROBIC 5CC EA  Final   Culture NO GROWTH 4 DAYS  Final   Report Status PENDING  Incomplete  Urine culture     Status: Abnormal   Collection Time: 06/11/16 12:04 AM  Result Value Ref Range Status   Specimen Description URINE,  RANDOM  Final   Special Requests NONE  Final   Culture <10,000 COLONIES/mL INSIGNIFICANT GROWTH (A)  Final   Report Status 06/12/2016 FINAL  Final  MRSA PCR Screening     Status: None   Collection Time: 06/11/16  2:30 AM  Result Value Ref Range Status   MRSA by PCR NEGATIVE NEGATIVE Final    Comment:        The GeneXpert MRSA Assay (FDA approved for NASAL specimens only), is one component of a comprehensive MRSA colonization surveillance program. It is not intended to diagnose MRSA infection nor to guide or monitor treatment for MRSA infections.      Time coordinating discharge: Over 30 minutes  SIGNED:   Calvert CantorIZWAN,SAIMA, MD  [facility] 06/15/2016, 9:13 AM Pager   If 7PM-7AM, please contact night-coverage [email or web address] Password [email or web address] Esperanza [email or web address], Holleigh Crihfield N   06/16/16

## 2016-06-16 DIAGNOSIS — I10 Essential (primary) hypertension: Secondary | ICD-10-CM

## 2016-06-16 DIAGNOSIS — J13 Pneumonia due to Streptococcus pneumoniae: Secondary | ICD-10-CM

## 2016-06-16 DIAGNOSIS — R0602 Shortness of breath: Secondary | ICD-10-CM

## 2016-06-16 DIAGNOSIS — A419 Sepsis, unspecified organism: Principal | ICD-10-CM

## 2016-06-16 NOTE — Progress Notes (Signed)
NURSING PROGRESS NOTE  Cindy Padilla [id number] Discharge Data: 06/16/2016 4:23 PM Attending Provider: No att. providers found WUJ:[id number],[id number], MD     [name] to be D/C'd Nursing Home, [name], per MD order.  Discussed with the patient the After Visit Summary and all questions fully answered. All IV's discontinued with no bleeding noted. All belongings returned to patient for patient to take home.   Last Vital Signs:  Blood pressure (!) 160/52, pulse (!) 50, temperature 98.6 F (37 C), temperature source Oral, resp. rate 16, height 5' (1.524 m), weight 86.1 kg (189 lb 12.8 oz), SpO2 98 %.  Discharge Medication List Allergies as of 06/16/2016      Reactions   Aminoglycosides Other (See Comments)   Unknown reaction (per MAR)   Aspirin Other (See Comments)   Unknown reaction (per MAR)   Codeine Other (See Comments)   Unknown reaction (per MAR)   Ivp Dye [iodinated Diagnostic Agents] Other (See Comments)   Unknown reaction (per MAR)   Morphine And Related Other (See Comments)   Unknown reaction (per MAR)   Neomycin Other (See Comments)   Unknown reaction (per MAR)   Penicillins Rash   Phenothiazines Other (See Comments)   Unknown reaction (per MAR)   Promethazine Other (See Comments)   unknown   Sulfa Antibiotics Other (See Comments)   Unknown reaction (per MAR)   Tetracyclines & Related Other (See Comments)   Unknown reaction (per [facility])      Medication List    STOP taking these medications   guaiFENesin 100 MG/5ML liquid Commonly known as:  ROBITUSSIN Replaced by:  guaiFENesin 600 MG 12 hr tablet   ipratropium 0.02 % nebulizer solution Commonly known as:  ATROVENT   methylPREDNISolone 4 MG Tbpk tablet Commonly known as:  MEDROL DOSEPAK   verapamil 120 MG CR tablet Commonly known as:  CALAN-SR     TAKE these medications   acetaminophen 325 MG tablet Commonly known as:  TYLENOL Take 650 mg by mouth 3 (three) times daily. What changed:   Another medication with the same name was removed. Continue taking this medication, and follow the directions you see here.   albuterol 108 (90 Base) MCG/ACT inhaler Commonly known as:  PROVENTIL HFA;VENTOLIN HFA Inhale 2 puffs into the lungs every 4 (four) hours as needed for wheezing or shortness of breath.   amLODipine 5 MG tablet Commonly known as:  NORVASC Take 5 mg by mouth daily.   antiseptic oral rinse Liqd 15 mLs by Mouth Rinse route at bedtime. Swish and spit   ASPERCREME W/LIDOCAINE 4 % cream Generic drug:  lidocaine Apply 1 application topically 3 (three) times daily as needed (pain).   atorvastatin 40 MG tablet Commonly known as:  LIPITOR Take 40 mg by mouth daily.   budesonide 0.25 MG/2ML nebulizer solution Commonly known as:  PULMICORT Take 2 mLs (0.25 mg total) by nebulization 2 (two) times daily.   budesonide-formoterol 160-4.5 MCG/ACT inhaler Commonly known as:  SYMBICORT Inhale 2 puffs into the lungs 2 (two) times daily. Rinse mouth after use   cholecalciferol 1000 units tablet Commonly known as:  VITAMIN D Take 1,000 Units by mouth daily.   clopidogrel 75 MG tablet Commonly known as:  PLAVIX Take 75 mg by mouth daily.   donepezil 10 MG tablet Commonly known as:  ARICEPT Take 10 mg by mouth at bedtime.   esomeprazole 20 MG capsule Commonly known as:  NEXIUM Take 20 mg by mouth daily at 12 noon.  ferrous sulfate 325 (65 FE) MG tablet Take 325 mg by mouth daily with breakfast.   fexofenadine 180 MG tablet Commonly known as:  ALLEGRA Take 180 mg by mouth daily.   fluticasone 50 MCG/ACT nasal spray Commonly known as:  FLONASE Place 2 sprays into both nostrils daily.   furosemide 20 MG tablet Commonly known as:  LASIX Take 20 mg by mouth daily with breakfast.   GERI-LANTA 200-200-20 MG/5ML suspension Generic drug:  alum & mag hydroxide-simeth Take 30 mLs by mouth See admin instructions. Give 1 hour after meals and at bedtime as needed for  indigestion   guaiFENesin 600 MG 12 hr tablet Commonly known as:  MUCINEX Take 1 tablet (600 mg total) by mouth 2 (two) times daily. Replaces:  guaiFENesin 100 MG/5ML liquid   ipratropium-albuterol 0.5-2.5 (3) MG/3ML Soln Commonly known as:  DUONEB Take 3 mLs by nebulization every 6 (six) hours.   lamoTRIgine 200 MG tablet Commonly known as:  LAMICTAL Take 200 mg by mouth 2 (two) times daily.   levofloxacin 750 MG tablet Commonly known as:  LEVAQUIN Take 1 tablet (750 mg total) by mouth daily.   loperamide 2 MG capsule Commonly known as:  IMODIUM Take 2 mg by mouth as needed for diarrhea or loose stools. Do not exceed 8 doses in 24 hours   LORazepam 1 MG tablet Commonly known as:  ATIVAN Take 1 tablet (1 mg total) by mouth every 12 (twelve) hours.   magnesium hydroxide 400 MG/5ML suspension Commonly known as:  MILK OF MAGNESIA Take 30 mLs by mouth at bedtime as needed for mild constipation.   magnesium oxide 400 MG tablet Commonly known as:  MAG-OX Take 400 mg by mouth daily.   montelukast 10 MG tablet Commonly known as:  SINGULAIR Take 10 mg by mouth at bedtime.   phenylephrine-shark liver oil-mineral oil-petrolatum 0.25-3-14-71.9 % rectal ointment Commonly known as:  PREPARATION H Place 1 application rectally 2 (two) times daily as needed for hemorrhoids.   predniSONE 20 MG tablet Commonly known as:  DELTASONE Take 1 tablet (20 mg total) by mouth daily with breakfast. 20 mg tomorrow, 10 mg the next day, 5 mg the last day   QUEtiapine 100 MG tablet Commonly known as:  SEROQUEL Take 150 mg by mouth 2 (two) times daily. Hold for sedation   ranitidine 150 MG tablet Commonly known as:  ZANTAC Take 150 mg by mouth 2 (two) times daily.   senna-docusate 8.6-50 MG tablet Commonly known as:  Senokot-S Take 1 tablet by mouth 2 (two) times daily.   triamcinolone cream 0.1 % Commonly known as:  KENALOG Apply 1 application topically daily as needed (irritation).    valsartan 320 MG tablet Commonly known as:  DIOVAN Take 1 tablet (320 mg total) by mouth daily.   vitamin A & D ointment Apply 1 application topically every 6 (six) hours as needed (rectal pain).

## 2016-06-16 NOTE — Care Management Note (Signed)
Case Management Note  Patient Details  Name: Cindy Padilla MRN: [id number] Date of Birth: December 30, 1940  Subjective/Objective:  76 y.o. F admitted 06/11/2015 with SOB. [facility]. Disposition has been deferred to CSW.                   Action/Plan:CM will sign off for now but will be available should additional discharge needs arise or disposition change.    Expected Discharge Date:                  Expected Discharge Plan:  Assisted Living [facility]  In-House Referral:  Clinical Social Work  Discharge planning Services  CM Consult  Post Acute Care Choice:  NA Choice offered to:     DME Arranged:    DME Agency:     HH Arranged:    HH Agency:     Status of Service:  Completed, signed off  If discussed at [employer] Meetings, dates discussed:    Additional Comments:  Yvone NeuCrutchfield, Rien Marland M, RN 06/16/2016, 9:30 AM

## 2016-06-16 NOTE — Clinical Social Work Note (Signed)
Per MD [facility]. RN, patient/family, and facility Programme researcher, broadcasting/film/video(Administrator Jennifer) notified of patient's DC. RN given number for report. DC packet on patient's chart. Ambulance transport requested for patient. CSW signing off at this time.   Roddie McBryant Rembert Browe MSW, [place]CSW, [place], [phone]

## 2016-10-03 ENCOUNTER — Emergency Department (HOSPITAL_COMMUNITY): Payer: Medicare Other

## 2016-10-03 ENCOUNTER — Emergency Department (HOSPITAL_COMMUNITY)
Admission: EM | Admit: 2016-10-03 | Discharge: 2016-10-03 | Disposition: A | Discharge status: Home / Self Care | Payer: Medicare Other | Attending: Emergency Medicine | Admitting: Emergency Medicine

## 2016-10-03 ENCOUNTER — Encounter (HOSPITAL_COMMUNITY): Payer: Self-pay | Admitting: *Deleted

## 2016-10-03 DIAGNOSIS — J449 Chronic obstructive pulmonary disease, unspecified: Secondary | ICD-10-CM | POA: Insufficient documentation

## 2016-10-03 DIAGNOSIS — R1030 Lower abdominal pain, unspecified: Secondary | ICD-10-CM | POA: Insufficient documentation

## 2016-10-03 DIAGNOSIS — I110 Hypertensive heart disease with heart failure: Secondary | ICD-10-CM | POA: Diagnosis not present

## 2016-10-03 DIAGNOSIS — Z87891 Personal history of nicotine dependence: Secondary | ICD-10-CM | POA: Insufficient documentation

## 2016-10-03 DIAGNOSIS — E119 Type 2 diabetes mellitus without complications: Secondary | ICD-10-CM | POA: Diagnosis not present

## 2016-10-03 DIAGNOSIS — I5030 Unspecified diastolic (congestive) heart failure: Secondary | ICD-10-CM | POA: Diagnosis not present

## 2016-10-03 DIAGNOSIS — R109 Unspecified abdominal pain: Secondary | ICD-10-CM

## 2016-10-03 DIAGNOSIS — Z79899 Other long term (current) drug therapy: Secondary | ICD-10-CM | POA: Insufficient documentation

## 2016-10-03 LAB — COMPREHENSIVE METABOLIC PANEL
ALBUMIN: 4.4 g/dL (ref 3.5–5.0)
ALK PHOS: 89 U/L (ref 38–126)
ALT: 21 U/L (ref 14–54)
AST: 28 U/L (ref 15–41)
Anion gap: 9 (ref 5–15)
BILIRUBIN TOTAL: 0.4 mg/dL (ref 0.3–1.2)
BUN: 14 mg/dL (ref 6–20)
CALCIUM: 9.1 mg/dL (ref 8.9–10.3)
CO2: 25 mmol/L (ref 22–32)
CREATININE: 0.78 mg/dL (ref 0.44–1.00)
Chloride: 101 mmol/L (ref 101–111)
GFR calc non Af Amer: >60 mL/min (ref >60)
Glucose, Bld: 112 mg/dL — ABNORMAL HIGH (ref 65–99)
Potassium: 4.2 mmol/L (ref 3.5–5.1)
Sodium: 135 mmol/L (ref 135–145)
TOTAL PROTEIN: 7.6 g/dL (ref 6.5–8.1)

## 2016-10-03 LAB — CBC WITH DIFFERENTIAL/PLATELET
BASOS ABS: 0 10*3/uL (ref 0.0–0.1)
Basophils Relative: 0 %
Eosinophils Absolute: 0.1 10*3/uL (ref 0.0–0.7)
Eosinophils Relative: 1 %
HEMATOCRIT: 34.1 % — AB (ref 36.0–46.0)
HEMOGLOBIN: 11.3 g/dL — AB (ref 12.0–15.0)
Lymphocytes Relative: 35 %
Lymphs Abs: 2.4 10*3/uL (ref 0.7–4.0)
MCH: 29.7 pg (ref 26.0–34.0)
MCHC: 33.1 g/dL (ref 30.0–36.0)
MCV: 89.5 fL (ref 78.0–100.0)
Monocytes Absolute: 0.6 10*3/uL (ref 0.1–1.0)
Monocytes Relative: 8 %
NEUTROS ABS: 3.8 10*3/uL (ref 1.7–7.7)
Neutrophils Relative %: 56 %
Platelets: 234 10*3/uL (ref 150–400)
RBC: 3.81 MIL/uL — AB (ref 3.87–5.11)
RDW: 14.4 % (ref 11.5–15.5)
WBC: 6.8 10*3/uL (ref 4.0–10.5)

## 2016-10-03 LAB — URINALYSIS, ROUTINE W REFLEX MICROSCOPIC
BILIRUBIN URINE: NEGATIVE
Glucose, UA: NEGATIVE mg/dL
HGB URINE DIPSTICK: NEGATIVE
Ketones, ur: NEGATIVE mg/dL
Leukocytes, UA: NEGATIVE
Nitrite: NEGATIVE
PH: 7 (ref 5.0–8.0)
Protein, ur: NEGATIVE mg/dL
SPECIFIC GRAVITY, URINE: 1.008 (ref 1.005–1.030)

## 2016-10-03 LAB — LIPASE, BLOOD: Lipase: 53 U/L — ABNORMAL HIGH (ref 11–51)

## 2016-10-03 MED ORDER — SODIUM CHLORIDE 0.9 % IV BOLUS (SEPSIS)
1000.0000 mL | Freq: Once | INTRAVENOUS | Status: AC
  Administered 2016-10-03: 1000 mL via INTRAVENOUS

## 2016-10-03 MED ORDER — FENTANYL CITRATE (PF) 100 MCG/2ML IJ SOLN
50.0000 ug | Freq: Once | INTRAMUSCULAR | Status: AC
  Administered 2016-10-03: 50 ug via INTRAVENOUS
  Filled 2016-10-03: qty 2

## 2016-10-03 MED ORDER — IBUPROFEN 400 MG PO TABS: 400.0000 mg | ORAL_TABLET | Freq: Four times a day (QID) | ORAL | 0 refills | Status: DC | PRN

## 2016-10-03 MED ORDER — KETOROLAC TROMETHAMINE 30 MG/ML IJ SOLN
15.0000 mg | Freq: Once | INTRAMUSCULAR | Status: AC
  Administered 2016-10-03: 15 mg via INTRAVENOUS
  Filled 2016-10-03: qty 1

## 2016-10-03 NOTE — ED Notes (Signed)
[facility]. [phone] Report [facility]. Discharge instructions reviewed. Questions concerns denied. Pt remains a&ox4. [facility] contacted for transport

## 2016-10-03 NOTE — ED Notes (Signed)
Patient transported to Ultrasound 

## 2016-10-03 NOTE — ED Notes (Signed)
Bed: [id number] Expected date:  Expected time:  Means of arrival:  Comments: EMS pelvic pain

## 2016-10-03 NOTE — ED Notes (Signed)
Note left that facility [facility] called to state DNR form was not sent with patient. Pt is DNR

## 2016-10-03 NOTE — ED Triage Notes (Addendum)
[facility]. Pt c/o increased lower abdominal pain, worsened with activity such as walking. Pt denies n/v but reports feeling of lightheaded earlier this am. Pt  Also c/o "hemorrhoid pain". Pt stated "I feel like im pregnant" Last BM was today. Per EMS pt received Tylenol 500 mg po and albuterol nebulizer treatment at 1330. Pain has not resolved breathing is completely better.

## 2016-10-03 NOTE — ED Provider Notes (Signed)
[facility]-EMERGENCY DEPT Provider Note   CSN: [id number] Arrival date & time: 10/03/16  1434     History   Chief Complaint Chief Complaint  Patient presents with  . Abdominal Pain    HPI Cindy Padilla is a 76 y.o. female.   Abdominal Pain   This is a new problem. The current episode started more than 2 days ago. The problem occurs constantly. The problem has not changed since onset.Associated with: movement. The pain is located in the suprapubic region. The pain is moderate. Pertinent negatives include anorexia, fever, nausea, vomiting and dysuria. Nothing aggravates the symptoms. Nothing relieves the symptoms.    Past Medical History:  Diagnosis Date  . Arthritis   . Bipolar 1 disorder (HCC)   . Bronchitis   . Chronic back pain   . Constipation   . COPD (chronic obstructive pulmonary disease) (HCC)   . Dementia   . Diabetes mellitus   . GERD (gastroesophageal reflux disease)   . Hiatal hernia   . Hypertension   . IBS (irritable bowel syndrome)     Patient Active Problem List   Diagnosis Date Noted  . Streptococcus pneumoniae pneumonia (HCC) 06/15/2016  . Sepsis (HCC) 06/10/2016  . Morbid obesity (HCC) 04/14/2015  . Upper airway cough syndrome 04/14/2015  . Ventral hernia 04/24/2014  . Constipation 04/24/2014  . Abdominal pain, epigastric 04/24/2014  . Partial small bowel obstruction (HCC) 04/22/2014  . Hemorrhoids 04/22/2014  . Abnormality of gait 09/30/2013  . 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 Diastolic heart failure (HCC) 07/27/2013  . Dementia with behavioral disturbance 07/27/2013  . HCAP (healthcare-associated pneumonia) 07/24/2013  . Acute respiratory failure (HCC) 02/21/2013  . Weakness 02/09/2013  . Hypokalemia 02/09/2013  . Hyponatremia 02/09/2013  . Shortness of breath 02/09/2013  . HTN (hypertension) 02/09/2013  . Other and unspecified hyperlipidemia 02/09/2013    Past Surgical History:  Procedure  Laterality Date  . gallbladder    . MANDIBLE SURGERY    . PARTIAL HYSTERECTOMY      OB History    No data available       Home Medications    Prior to Admission medications   Medication Sig Start Date End Date Taking? Authorizing Provider  acetaminophen (TYLENOL) 325 MG tablet Take 650 mg by mouth 3 (three) times daily.   Yes [provider]  albuterol (PROVENTIL HFA;VENTOLIN HFA) 108 (90 BASE) MCG/ACT inhaler Inhale 2 puffs into the lungs every 4 (four) hours as needed for wheezing or shortness of breath.   Yes [provider]  alum & mag hydroxide-simeth (GERI-LANTA) 200-200-20 MG/5ML suspension Take 30 mLs by mouth 4 (four) times daily as needed for indigestion or heartburn.    Yes [provider]  amLODipine (NORVASC) 5 MG tablet Take 5 mg by mouth 2 (two) times daily.    Yes [provider]  antiseptic oral rinse (BIOTENE) LIQD 15 mLs by Mouth Rinse route at bedtime. Swish and spit   Yes [provider]  atorvastatin (LIPITOR) 40 MG tablet Take 40 mg by mouth daily.    Yes [provider]  budesonide-formoterol (SYMBICORT) 160-4.5 MCG/ACT inhaler Inhale 2 puffs into the lungs 2 (two) times daily.    Yes [provider]  cholecalciferol (VITAMIN D) 1000 UNITS tablet Take 1,000 Units by mouth daily.    Yes [provider]  clopidogrel (PLAVIX) 75 MG tablet Take 75 mg by mouth daily.    Yes [provider]  donepezil (ARICEPT)  10 MG tablet Take 10 mg by mouth at bedtime.    Yes [provider]  esomeprazole (NEXIUM) 40 MG capsule Take 40 mg by mouth daily before breakfast.   Yes [provider]  ferrous sulfate 325 (65 FE) MG tablet Take 325 mg by mouth daily with breakfast.   Yes [provider]  fexofenadine (ALLEGRA) 180 MG tablet Take 180 mg by mouth daily.   Yes [provider]  fluticasone (FLONASE) 50 MCG/ACT nasal spray Place 2 sprays into both nostrils daily.     Yes [provider]  furosemide (LASIX) 20 MG tablet Take 20 mg by mouth daily with breakfast.   Yes [provider]  guaiFENesin (MUCINEX) 600 MG 12 hr tablet Take 1 tablet (600 mg total) by mouth 2 (two) times daily. 06/15/16  Yes Rizwan, Ladell Heads, MD  ipratropium-albuterol (DUONEB) 0.5-2.5 (3) MG/3ML SOLN Take 3 mLs by nebulization every 8 (eight) hours.    Yes [provider]  lamoTRIgine (LAMICTAL) 200 MG tablet Take 200 mg by mouth 2 (two) times daily.   Yes [provider]  lidocaine (ASPERCREME W/LIDOCAINE) 4 % cream Apply 1 application topically 3 (three) times daily as needed (for pain).    Yes [provider]  loperamide (IMODIUM) 2 MG capsule Take 2 mg by mouth every 3 (three) hours as needed for diarrhea or loose stools.    Yes [provider]  LORazepam (ATIVAN) 1 MG tablet Take 1 tablet (1 mg total) by mouth every 12 (twelve) hours. 06/15/16  Yes [name], MD  magnesium hydroxide (MILK OF MAGNESIA) 400 MG/5ML suspension Take 30 mLs by mouth at bedtime as needed for mild constipation.   Yes [provider]  magnesium oxide (MAG-OX) 400 (241.3 Mg) MG tablet Take 400 mg by mouth daily.   Yes [provider]  montelukast (SINGULAIR) 10 MG tablet Take 10 mg by mouth at bedtime.   Yes [provider]  phenylephrine-shark liver oil-mineral oil-petrolatum (PREPARATION H) 0.25-3-14-71.9 % rectal ointment Place 1 application rectally 2 (two) times daily as needed for hemorrhoids.   Yes [provider]  QUEtiapine (SEROQUEL) 100 MG tablet Take 150 mg by mouth 2 (two) times daily.    Yes [provider]  ranitidine (ZANTAC) 150 MG tablet Take 150 mg by mouth at bedtime.    Yes [provider]  senna-docusate (SENOKOT-S) 8.6-50 MG per tablet Take 1 tablet by mouth 2 (two) times daily.   Yes [provider]  triamcinolone cream (KENALOG) 0.1 % Apply 1 application topically daily as  needed (for irritation).    Yes [provider]  valsartan (DIOVAN) 320 MG tablet Take 1 tablet (320 mg total) by mouth daily. 04/16/13  Yes Nyoka Cowden, MD  Vitamins A & D (VITAMIN A & D) ointment Apply 1 application topically every 6 (six) hours as needed (for rectal pain).    Yes [provider]  ibuprofen (ADVIL,MOTRIN) 400 MG tablet Take 1 tablet (400 mg total) by mouth every 6 (six) hours as needed for mild pain. 10/03/16   Kratos Ruscitti, Barbara Cower, MD    Family History Family History  Problem Relation Age of Onset  . Emphysema Mother   . Cancer Other     Social History Social History  Substance Use Topics  . Smoking status: Former Smoker    Packs/day: 0.50    Years: 2.00    Types: Cigarettes    Quit date: 05/28/1959  . Smokeless tobacco: Former Neurosurgeon  Types: Snuff    Quit date: 05/28/1959     Comment: Pt used snuff as a teenager  . Alcohol use No     Allergies   Aminoglycosides; Aspirin; Codeine; Ivp dye [iodinated diagnostic agents]; Morphine and related; Neomycin; Penicillins; Phenothiazines; Promethazine; Sulfa antibiotics; and Tetracyclines & related   Review of Systems Review of Systems  Constitutional: Negative for fever.  Gastrointestinal: Positive for abdominal pain. Negative for anorexia, nausea and vomiting.  Genitourinary: Negative for dysuria.  All other systems reviewed and are negative.    Physical Exam Updated Vital Signs BP (!) 151/76 (BP Location: Right Arm)   Pulse 88   Temp 98 F (36.7 C) (Oral)   Resp 18   SpO2 98%   Physical Exam  Constitutional: She is oriented to person, place, and time. She appears well-developed and well-nourished.  HENT:  Head: Normocephalic and atraumatic.  Eyes: Conjunctivae and EOM are normal.  Neck: Normal range of motion.  Cardiovascular: Normal rate and regular rhythm.  Exam reveals no gallop.   Murmur heard. Pulmonary/Chest: Effort normal and breath sounds normal. No stridor. No respiratory  distress.  Abdominal: Soft. Bowel sounds are normal. She exhibits no distension and no mass. There is tenderness (suprapubic ). There is no guarding. A hernia (upper abdomen towards right) is present.  Musculoskeletal: Normal range of motion. She exhibits no edema or deformity.  Neurological: She is alert and oriented to person, place, and time.  Skin: Skin is warm and dry.  Nursing note and vitals reviewed.    ED Treatments / Results  Labs (all labs ordered are listed, but only abnormal results are displayed) Labs Reviewed  CBC WITH DIFFERENTIAL/PLATELET - Abnormal; Notable for the following:       Result Value   RBC 3.81 (*)    Hemoglobin 11.3 (*)    HCT 34.1 (*)    All other components within normal limits  COMPREHENSIVE METABOLIC PANEL - Abnormal; Notable for the following:    Glucose, Bld 112 (*)    All other components within normal limits  LIPASE, BLOOD - Abnormal; Notable for the following:    Lipase 53 (*)    All other components within normal limits  URINALYSIS, ROUTINE W REFLEX MICROSCOPIC    EKG  EKG Interpretation None       Radiology Ct Renal Stone Study  Result Date: 10/03/2016 CLINICAL DATA:  Lower abdominal/pubic pain. EXAM: CT ABDOMEN AND PELVIS WITHOUT CONTRAST TECHNIQUE: Multidetector CT imaging of the abdomen and pelvis was performed following the standard protocol without IV contrast. COMPARISON:  06/11/2016 CT abdomen/pelvis. FINDINGS: Lower chest: No significant pulmonary nodules or acute consolidative airspace disease. Hepatobiliary: Diffuse hepatic steatosis. Simple superior liver cysts, largest 4.3 cm in left liver lobe. Additional subcentimeter hypodense inferior right liver lobe lesion, too small to characterize, not appreciably changed, suggesting a benign lesion. No new liver lesions. Cholecystectomy. Bile ducts are stable and within expected post cholecystectomy limits with common bile duct diameter 8 mm. No radiopaque choledocholithiasis.  Pancreas: Normal, with no mass or duct dilation. Spleen: Normal size spleen. Stable chronic focal bulge of the anterior inferior spleen back to 09/19/2014. No discrete visualized splenic mass. Adrenals/Urinary Tract: Normal adrenals. Stable chronic mild fullness of the central right renal collecting system without overt right hydronephrosis. No left hydronephrosis. No renal stones. Normal caliber ureters, with no ureteral stones. No contour deforming renal mass. Tiny vascular calcifications are noted in the renal sinuses bilaterally. Normal bladder. Stomach/Bowel: Stable moderate medial right upper ventral abdominal  hernia containing a portion of the distal stomach. Stomach is relatively collapsed with no definite gastric wall thickening or pneumatosis. Normal caliber small bowel with no small bowel wall thickening. Appendix not discretely visualized. No pericecal inflammatory changes. Mild sigmoid diverticulosis, with no large bowel wall thickening or pericolonic fat stranding. Vascular/Lymphatic: Atherosclerotic nonaneurysmal abdominal aorta. No pathologically enlarged lymph nodes in the abdomen or pelvis. Reproductive: Status post hysterectomy, with no abnormal findings at the vaginal cuff. No adnexal mass. Other: No pneumoperitoneum, ascites or focal fluid collection. Musculoskeletal: No aggressive appearing focal osseous lesions. Moderate thoracolumbar spondylosis. IMPRESSION: 1. Stable moderate medial right upper ventral abdominal wall hernia containing a portion of the distal stomach, with no evidence of gastric outlet obstruction or ischemia. 2. No evidence of bowel obstruction or acute bowel inflammation. Mild sigmoid diverticulosis, with no evidence of acute diverticulitis. 3. No urolithiasis.  No evidence of acute urinary tract obstruction. 4. Additional findings include aortic atherosclerosis and diffuse hepatic steatosis. Electronically Signed   By: Delbert Phenix M.D.   On: 10/03/2016 16:57     Procedures Procedures (including critical care time)  Medications Ordered in ED Medications  fentaNYL (SUBLIMAZE) injection 50 mcg (50 mcg Intravenous Given 10/03/16 1543)  sodium chloride 0.9 % bolus 1,000 mL (0 mLs Intravenous Stopped 10/03/16 1923)  ketorolac (TORADOL) 30 MG/ML injection 15 mg (15 mg Intravenous Given 10/03/16 1543)     Initial Impression / Assessment and Plan / ED Course  I have reviewed the triage vital signs and the nursing notes.  Pertinent labs & imaging results that were available during my care of the patient were reviewed by me and considered in my medical decision making (see chart for details).    Kidney stone vs UTI vs muscular cause for symptoms. Will workup appropriately.   Workup negative. Possibly muscular. Stable for dc.   Final Clinical Impressions(s) / ED Diagnoses   Final diagnoses:  Abdominal pain, unspecified abdominal location    New Prescriptions Discharge Medication List as of 10/03/2016  5:34 PM    START taking these medications   Details  ibuprofen (ADVIL,MOTRIN) 400 MG tablet Take 1 tablet (400 mg total) by mouth every 6 (six) hours as needed for mild pain., Starting Thu 10/03/2016, Print         Jamaree Hosier, Barbara Cower, MD 10/03/16 [phone]

## 2016-10-31 ENCOUNTER — Emergency Department (HOSPITAL_COMMUNITY)
Admission: EM | Admit: 2016-10-31 | Discharge: 2016-10-31 | Disposition: A | Discharge status: Home / Self Care | Payer: Medicare Other | Attending: Emergency Medicine | Admitting: Emergency Medicine

## 2016-10-31 ENCOUNTER — Encounter (HOSPITAL_COMMUNITY): Payer: Self-pay | Admitting: *Deleted

## 2016-10-31 ENCOUNTER — Emergency Department (HOSPITAL_COMMUNITY): Payer: Medicare Other

## 2016-10-31 DIAGNOSIS — Z79899 Other long term (current) drug therapy: Secondary | ICD-10-CM | POA: Diagnosis not present

## 2016-10-31 DIAGNOSIS — R109 Unspecified abdominal pain: Secondary | ICD-10-CM | POA: Diagnosis present

## 2016-10-31 DIAGNOSIS — R1030 Lower abdominal pain, unspecified: Secondary | ICD-10-CM | POA: Diagnosis not present

## 2016-10-31 DIAGNOSIS — J449 Chronic obstructive pulmonary disease, unspecified: Secondary | ICD-10-CM | POA: Diagnosis not present

## 2016-10-31 DIAGNOSIS — I110 Hypertensive heart disease with heart failure: Secondary | ICD-10-CM | POA: Diagnosis not present

## 2016-10-31 DIAGNOSIS — I5030 Unspecified diastolic (congestive) heart failure: Secondary | ICD-10-CM | POA: Insufficient documentation

## 2016-10-31 DIAGNOSIS — Z87891 Personal history of nicotine dependence: Secondary | ICD-10-CM | POA: Insufficient documentation

## 2016-10-31 DIAGNOSIS — E119 Type 2 diabetes mellitus without complications: Secondary | ICD-10-CM | POA: Diagnosis not present

## 2016-10-31 LAB — COMPREHENSIVE METABOLIC PANEL
ALK PHOS: 98 U/L (ref 38–126)
ALT: 22 U/L (ref 14–54)
AST: 29 U/L (ref 15–41)
Albumin: 3.8 g/dL (ref 3.5–5.0)
Anion gap: 9 (ref 5–15)
BILIRUBIN TOTAL: 0.4 mg/dL (ref 0.3–1.2)
BUN: 13 mg/dL (ref 6–20)
CALCIUM: 9.1 mg/dL (ref 8.9–10.3)
CO2: 27 mmol/L (ref 22–32)
CREATININE: 0.71 mg/dL (ref 0.44–1.00)
Chloride: 99 mmol/L — ABNORMAL LOW (ref 101–111)
Glucose, Bld: 115 mg/dL — ABNORMAL HIGH (ref 65–99)
Potassium: 4.1 mmol/L (ref 3.5–5.1)
Sodium: 135 mmol/L (ref 135–145)
TOTAL PROTEIN: 7.2 g/dL (ref 6.5–8.1)

## 2016-10-31 LAB — URINALYSIS, ROUTINE W REFLEX MICROSCOPIC
Bacteria, UA: NONE SEEN
Bilirubin Urine: NEGATIVE
GLUCOSE, UA: NEGATIVE mg/dL
Ketones, ur: NEGATIVE mg/dL
Leukocytes, UA: NEGATIVE
NITRITE: NEGATIVE
PROTEIN: NEGATIVE mg/dL
SPECIFIC GRAVITY, URINE: 1.004 — AB (ref 1.005–1.030)
pH: 6 (ref 5.0–8.0)

## 2016-10-31 LAB — CBC WITH DIFFERENTIAL/PLATELET
Basophils Absolute: 0 10*3/uL (ref 0.0–0.1)
Basophils Relative: 0 %
EOS ABS: 0.1 10*3/uL (ref 0.0–0.7)
Eosinophils Relative: 2 %
HCT: 32.4 % — ABNORMAL LOW (ref 36.0–46.0)
HEMOGLOBIN: 10.7 g/dL — AB (ref 12.0–15.0)
LYMPHS ABS: 2.5 10*3/uL (ref 0.7–4.0)
LYMPHS PCT: 38 %
MCH: 29.4 pg (ref 26.0–34.0)
MCHC: 33 g/dL (ref 30.0–36.0)
MCV: 89 fL (ref 78.0–100.0)
MONOS PCT: 12 %
Monocytes Absolute: 0.8 10*3/uL (ref 0.1–1.0)
NEUTROS PCT: 48 %
Neutro Abs: 3.2 10*3/uL (ref 1.7–7.7)
Platelets: 234 10*3/uL (ref 150–400)
RBC: 3.64 MIL/uL — AB (ref 3.87–5.11)
RDW: 14.6 % (ref 11.5–15.5)
WBC: 6.6 10*3/uL (ref 4.0–10.5)

## 2016-10-31 LAB — LIPASE, BLOOD: LIPASE: 38 U/L (ref 11–51)

## 2016-10-31 MED ORDER — SODIUM CHLORIDE 0.9 % IV BOLUS (SEPSIS)
1000.0000 mL | Freq: Once | INTRAVENOUS | Status: AC
  Administered 2016-10-31: 1000 mL via INTRAVENOUS

## 2016-10-31 MED ORDER — DICYCLOMINE HCL 10 MG/ML IM SOLN
20.0000 mg | Freq: Once | INTRAMUSCULAR | Status: AC
  Administered 2016-10-31: 20 mg via INTRAMUSCULAR
  Filled 2016-10-31: qty 2

## 2016-10-31 MED ORDER — DICYCLOMINE HCL 20 MG PO TABS: 20.0000 mg | ORAL_TABLET | Freq: Two times a day (BID) | ORAL | 0 refills | Status: DC

## 2016-10-31 NOTE — ED Notes (Signed)
ED Provider at bedside. 

## 2016-10-31 NOTE — ED Notes (Signed)
[facility] given report

## 2016-10-31 NOTE — ED Provider Notes (Signed)
[facility]-EMERGENCY DEPT Provider Note   CSN: [id number] Arrival date & time: 10/31/16  1722     History   Chief Complaint Chief Complaint  Patient presents with  . Abdominal Pain    HPI Cindy Padilla is a 76 y.o. female.  HPI   Presents with concern for abdominal pain that has been present for one month. Reports that it is significantly worsened today. Reports that she is laying down it is not as severe, but if she is standing up or moving, it becomes severe, 10 out of 10. Denies any nausea or vomiting. Reports she's had IBS, interested history of intermittent diarrhea and constipation. Most recently has had some loose stool. Denies dysuria or fever. Reports she has been eating okay. Reports she's been passing flatus. Reports a feeling of bladder pressure. Reports it feels as if she is pregnant and her pregnancy has dropped into her pelvis.  Past Medical History:  Diagnosis Date  . Arthritis   . Bipolar 1 disorder (HCC)   . Bronchitis   . Chronic back pain   . Constipation   . COPD (chronic obstructive pulmonary disease) (HCC)   . Dementia   . Diabetes mellitus   . GERD (gastroesophageal reflux disease)   . Hiatal hernia   . Hypertension   . IBS (irritable bowel syndrome)     Patient Active Problem List   Diagnosis Date Noted  . Streptococcus pneumoniae pneumonia (HCC) 06/15/2016  . Sepsis (HCC) 06/10/2016  . Morbid obesity (HCC) 04/14/2015  . Upper airway cough syndrome 04/14/2015  . Ventral hernia 04/24/2014  . Constipation 04/24/2014  . Abdominal pain, epigastric 04/24/2014  . Partial small bowel obstruction (HCC) 04/22/2014  . Hemorrhoids 04/22/2014  . Abnormality of gait 09/30/2013  . 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 Diastolic heart failure (HCC) 07/27/2013  . Dementia with behavioral disturbance 07/27/2013  . HCAP (healthcare-associated pneumonia) 07/24/2013  . Acute respiratory failure (HCC) 02/21/2013  . Weakness  02/09/2013  . Hypokalemia 02/09/2013  . Hyponatremia 02/09/2013  . Shortness of breath 02/09/2013  . HTN (hypertension) 02/09/2013  . Other and unspecified hyperlipidemia 02/09/2013    Past Surgical History:  Procedure Laterality Date  . gallbladder    . MANDIBLE SURGERY    . PARTIAL HYSTERECTOMY      OB History    No data available       Home Medications    Prior to Admission medications   Medication Sig Start Date End Date Taking? Authorizing Provider  acetaminophen (TYLENOL) 325 MG tablet Take 650 mg by mouth 3 (three) times daily.   Yes [provider]  albuterol (PROVENTIL HFA;VENTOLIN HFA) 108 (90 BASE) MCG/ACT inhaler Inhale 2 puffs into the lungs every 4 (four) hours as needed for wheezing or shortness of breath.   Yes [provider]  alum & mag hydroxide-simeth (GERI-LANTA) 200-200-20 MG/5ML suspension Take 30 mLs by mouth 4 (four) times daily as needed for indigestion or heartburn.    Yes [provider]  amLODipine (NORVASC) 5 MG tablet Take 5 mg by mouth 2 (two) times daily.    Yes [provider]  antiseptic oral rinse (BIOTENE) LIQD 15 mLs by Mouth Rinse route at bedtime. Swish and spit   Yes [provider]  atorvastatin (LIPITOR) 40 MG tablet Take 40 mg by mouth daily.    Yes [provider]  budesonide-formoterol (SYMBICORT) 160-4.5 MCG/ACT inhaler Inhale 2 puffs into the lungs 2 (two) times daily.  Yes [provider]  cholecalciferol (VITAMIN D) 1000 UNITS tablet Take 1,000 Units by mouth daily.    Yes [provider]  clopidogrel (PLAVIX) 75 MG tablet Take 75 mg by mouth daily.    Yes [provider]  donepezil (ARICEPT) 10 MG tablet Take 10 mg by mouth at bedtime.    Yes [provider]  esomeprazole (NEXIUM) 40 MG capsule Take 40 mg by mouth daily before breakfast.   Yes [provider]  ferrous sulfate 325 (65 FE) MG tablet Take 325 mg by mouth daily with  breakfast.   Yes [provider]  fexofenadine (ALLEGRA) 180 MG tablet Take 180 mg by mouth daily.   Yes [provider]  fluticasone (FLONASE) 50 MCG/ACT nasal spray Place 2 sprays into both nostrils daily.    Yes [provider]  furosemide (LASIX) 20 MG tablet Take 20 mg by mouth daily with breakfast.   Yes [provider]  guaiFENesin (MUCINEX) 600 MG 12 hr tablet Take 1 tablet (600 mg total) by mouth 2 (two) times daily. 06/15/16  Yes [name], MD  ibuprofen (ADVIL,MOTRIN) 400 MG tablet Take 1 tablet (400 mg total) by mouth every 6 (six) hours as needed for mild pain. 10/03/16  Yes Mesner, Barbara Cower, MD  ipratropium-albuterol (DUONEB) 0.5-2.5 (3) MG/3ML SOLN Take 3 mLs by nebulization every 8 (eight) hours.    Yes [provider]  lamoTRIgine (LAMICTAL) 200 MG tablet Take 200 mg by mouth 2 (two) times daily.   Yes [provider]  lidocaine (ASPERCREME W/LIDOCAINE) 4 % cream Apply 1 application topically 3 (three) times daily as needed (for pain).    Yes [provider]  loperamide (IMODIUM) 2 MG capsule Take 2 mg by mouth every 3 (three) hours as needed for diarrhea or loose stools.    Yes [provider]  LORazepam (ATIVAN) 1 MG tablet Take 1 tablet (1 mg total) by mouth every 12 (twelve) hours. 06/15/16  Yes [name], MD  magnesium hydroxide (MILK OF MAGNESIA) 400 MG/5ML suspension Take 30 mLs by mouth at bedtime as needed for mild constipation.   Yes [provider]  magnesium oxide (MAG-OX) 400 (241.3 Mg) MG tablet Take 400 mg by mouth daily.   Yes [provider]  montelukast (SINGULAIR) 10 MG tablet Take 10 mg by mouth at bedtime.   Yes [provider]  phenylephrine-shark liver oil-mineral oil-petrolatum (PREPARATION H) 0.25-3-14-71.9 % rectal ointment Place 1 application rectally 2 (two) times daily as needed for hemorrhoids.   Yes [provider]  QUEtiapine (SEROQUEL) 100 MG  tablet Take 150 mg by mouth 2 (two) times daily.    Yes [provider]  ranitidine (ZANTAC) 150 MG tablet Take 150 mg by mouth at bedtime.    Yes [provider]  senna-docusate (SENOKOT-S) 8.6-50 MG per tablet Take 1 tablet by mouth 2 (two) times daily.   Yes [provider]  triamcinolone cream (KENALOG) 0.1 % Apply 1 application topically daily as needed (for irritation).    Yes [provider]  valsartan (DIOVAN) 320 MG tablet Take 1 tablet (320 mg total) by mouth daily. 04/16/13  Yes Nyoka Cowden, MD  Vitamins A & D (VITAMIN A & D) ointment Apply 1 application topically every 6 (six) hours as needed (for rectal pain).    Yes [provider]  dicyclomine (BENTYL) 20 MG tablet Take 1 tablet (20 mg total) by mouth 2 (two) times daily. 10/31/16   Miku Udall,  Denny PeonErin, MD    Family History Family History  Problem Relation Age of Onset  . Emphysema Mother   . Cancer Other     Social History Social History  Substance Use Topics  . Smoking status: Former Smoker    Packs/day: 0.50    Years: 2.00    Types: Cigarettes    Quit date: 05/28/1959  . Smokeless tobacco: Former NeurosurgeonUser    Types: Snuff    Quit date: 05/28/1959     Comment: Pt used snuff as a teenager  . Alcohol use No     Allergies   Aminoglycosides; Aspirin; Codeine; Ivp dye [iodinated diagnostic agents]; Morphine and related; Neomycin; Penicillins; Phenothiazines; Promethazine; Sulfa antibiotics; and Tetracyclines & related   Review of Systems Review of Systems  Constitutional: Negative for fever.  HENT: Negative for sore throat.   Eyes: Negative for visual disturbance.  Respiratory: Negative for cough and shortness of breath.   Cardiovascular: Negative for chest pain.  Gastrointestinal: Positive for abdominal pain. Negative for constipation, diarrhea, nausea and vomiting.  Genitourinary: Negative for difficulty urinating and dysuria.  Musculoskeletal: Negative for back pain and  neck pain.  Skin: Negative for rash.  Neurological: Negative for syncope and headaches.     Physical Exam Updated Vital Signs BP (!) 131/98 (BP Location: Right Arm)   Pulse 72   Temp 98.3 F (36.8 C) (Oral)   Resp 20   SpO2 95%   Physical Exam  Constitutional: She is oriented to person, place, and time. She appears well-developed and well-nourished. No distress.  HENT:  Head: Normocephalic and atraumatic.  Eyes: Conjunctivae and EOM are normal.  Neck: Normal range of motion.  Cardiovascular: Normal rate, regular rhythm, normal heart sounds and intact distal pulses.  Exam reveals no gallop and no friction rub.   No murmur heard. Pulmonary/Chest: Effort normal and breath sounds normal. No respiratory distress. She has no wheezes (occasional). She has no rales.  Abdominal: Soft. She exhibits distension. There is tenderness (LLQ). There is guarding (LLQ).  Musculoskeletal: She exhibits no edema or tenderness.  Neurological: She is alert and oriented to person, place, and time.  Skin: Skin is warm and dry. No rash noted. She is not diaphoretic. No erythema.  Nursing note and vitals reviewed.    ED Treatments / Results  Labs (all labs ordered are listed, but only abnormal results are displayed) Labs Reviewed  CBC WITH DIFFERENTIAL/PLATELET - Abnormal; Notable for the following:       Result Value   RBC 3.64 (*)    Hemoglobin 10.7 (*)    HCT 32.4 (*)    All other components within normal limits  COMPREHENSIVE METABOLIC PANEL - Abnormal; Notable for the following:    Chloride 99 (*)    Glucose, Bld 115 (*)    All other components within normal limits  URINALYSIS, ROUTINE W REFLEX MICROSCOPIC - Abnormal; Notable for the following:    Color, Urine STRAW (*)    Specific Gravity, Urine 1.004 (*)    Hgb urine dipstick SMALL (*)    Squamous Epithelial / LPF 0-5 (*)    All other components within normal limits  LIPASE, BLOOD    EKG  EKG Interpretation None        Radiology Ct Abdomen Pelvis Wo Contrast  Result Date: 10/31/2016 CLINICAL DATA:  Abdominal pain intermittently for a month. Pressure in bladder. EXAM: CT ABDOMEN AND PELVIS WITHOUT CONTRAST TECHNIQUE: Multidetector CT imaging of the abdomen and pelvis was performed following the standard  protocol without IV contrast. COMPARISON:  Oct 03, 2016 FINDINGS: Lower chest: No acute abnormality. Hepatobiliary: Hepatic cysts are again identified and unchanged. The patient is status post cholecystectomy. Pancreas: Unremarkable. No pancreatic ductal dilatation or surrounding inflammatory changes. Spleen: Normal in size without focal abnormality. Adrenals/Urinary Tract: Mild hydronephrosis seen bilaterally is stable with no acute perinephric stranding. No ureterectasis or ureteral stones. No suspicious masses. The bladder is mildly distended but otherwise normal. Stomach/Bowel: The gastric antrum and proximal duodenum are herniated into a ventral hernia. No wall thickening or change in appearance in the interval. The small bowel is normal. Scattered colonic diverticuli without diverticulitis. Colon is otherwise normal. Visualized appendix is unremarkable. Vascular/Lymphatic: Dense atherosclerosis in the non aneurysmal aorta. No adenopathy. Reproductive: Status post hysterectomy. No adnexal masses. Other: Ventral hernia containing the distal stomach and proximal duodenum. Mild to moderate distention of the bladder. No other acute abnormalities. Musculoskeletal: No acute or significant osseous findings. IMPRESSION: 1. Mild to moderate bladder distention. The etiology and significance are unclear. 2. Mild hydronephrosis in both kidneys is unchanged and chronic, probably from previous obstruction or reflux. 3. There is a ventral hernia in the upper abdomen containing the distal stomach and proximal duodenum which is unchanged. No evidence of obstruction. 4. Atherosclerosis. Electronically Signed   By: Gerome Sam III M.D    On: 10/31/2016 20:56    Procedures Procedures (including critical care time)  Medications Ordered in ED Medications  sodium chloride 0.9 % bolus 1,000 mL (1,000 mLs Intravenous New Bag/Given 10/31/16 1913)  dicyclomine (BENTYL) injection 20 mg (20 mg Intramuscular Given 10/31/16 1931)     Initial Impression / Assessment and Plan / ED Course  I have reviewed the triage vital signs and the nursing notes.  Pertinent labs & imaging results that were available during my care of the patient were reviewed by me and considered in my medical decision making (see chart for details).     76 year old female with a history of bipolar, diastolic heart failure, hypertension, COPD, chronic back pain, diabetes, history of dementia and partial small bowel obstruction presents with concern for abdominal pain. Patient with left lower quadrant tenderness on exam, and abdominal CT was ordered which showed no acute findings. It does show mild to moderate bladder distention unchanged and chronic mild hydronephrosis bilaterally, unchanged ventral hernia.  Urinalysis shows no sign of UTI. Labs show no other significant changes. Creatinine is within normal limits, and doubt acute urinary obstruction as etiology of symptoms.  Recommend continued outpatient follow up. Possible IBS related discomfort. Patient discharged in stable condition with understanding of reasons to return. Given rx for bentyl.   Final Clinical Impressions(s) / ED Diagnoses   Final diagnoses:  Lower abdominal pain    New Prescriptions New Prescriptions   DICYCLOMINE (BENTYL) 20 MG TABLET    Take 1 tablet (20 mg total) by mouth 2 (two) times daily.     [name], MD 10/31/16 2132

## 2016-10-31 NOTE — ED Notes (Signed)
[facility] contacted  [facility]

## 2016-10-31 NOTE — ED Triage Notes (Signed)
Per EMS, called out for abdominal pain off and on for the past month. Pt states the pain is currently 10/10. Pain worse with movement. Pt denies n/v/d. Pt states she feels pressure in her bladder.

## 2017-02-24 ENCOUNTER — Encounter (HOSPITAL_COMMUNITY): Payer: Self-pay | Admitting: Emergency Medicine

## 2017-02-24 ENCOUNTER — Inpatient Hospital Stay (HOSPITAL_COMMUNITY)
Admission: EM | Admit: 2017-02-24 | Discharge: 2017-03-04 | DRG: 190 | Disposition: A | Discharge status: Skilled Nursing Facility | Payer: Medicare Other | Attending: Internal Medicine | Admitting: Internal Medicine

## 2017-02-24 ENCOUNTER — Emergency Department (HOSPITAL_COMMUNITY): Payer: Medicare Other

## 2017-02-24 DIAGNOSIS — R0602 Shortness of breath: Secondary | ICD-10-CM | POA: Diagnosis not present

## 2017-02-24 DIAGNOSIS — J209 Acute bronchitis, unspecified: Secondary | ICD-10-CM

## 2017-02-24 DIAGNOSIS — Z91041 Radiographic dye allergy status: Secondary | ICD-10-CM

## 2017-02-24 DIAGNOSIS — F0391 Unspecified dementia with behavioral disturbance: Secondary | ICD-10-CM | POA: Diagnosis present

## 2017-02-24 DIAGNOSIS — L97229 Non-pressure chronic ulcer of left calf with unspecified severity: Secondary | ICD-10-CM | POA: Diagnosis present

## 2017-02-24 DIAGNOSIS — E1165 Type 2 diabetes mellitus with hyperglycemia: Secondary | ICD-10-CM | POA: Diagnosis present

## 2017-02-24 DIAGNOSIS — T380X5A Adverse effect of glucocorticoids and synthetic analogues, initial encounter: Secondary | ICD-10-CM | POA: Diagnosis present

## 2017-02-24 DIAGNOSIS — Z79899 Other long term (current) drug therapy: Secondary | ICD-10-CM

## 2017-02-24 DIAGNOSIS — Z885 Allergy status to narcotic agent status: Secondary | ICD-10-CM

## 2017-02-24 DIAGNOSIS — R053 Chronic cough: Secondary | ICD-10-CM

## 2017-02-24 DIAGNOSIS — D649 Anemia, unspecified: Secondary | ICD-10-CM | POA: Diagnosis present

## 2017-02-24 DIAGNOSIS — F03918 Unspecified dementia, unspecified severity, with other behavioral disturbance: Secondary | ICD-10-CM | POA: Diagnosis present

## 2017-02-24 DIAGNOSIS — Z66 Do not resuscitate: Secondary | ICD-10-CM | POA: Diagnosis present

## 2017-02-24 DIAGNOSIS — R05 Cough: Secondary | ICD-10-CM

## 2017-02-24 DIAGNOSIS — J4541 Moderate persistent asthma with (acute) exacerbation: Secondary | ICD-10-CM

## 2017-02-24 DIAGNOSIS — K219 Gastro-esophageal reflux disease without esophagitis: Secondary | ICD-10-CM | POA: Diagnosis present

## 2017-02-24 DIAGNOSIS — Z880 Allergy status to penicillin: Secondary | ICD-10-CM

## 2017-02-24 DIAGNOSIS — F319 Bipolar disorder, unspecified: Secondary | ICD-10-CM | POA: Diagnosis present

## 2017-02-24 DIAGNOSIS — I10 Essential (primary) hypertension: Secondary | ICD-10-CM | POA: Diagnosis present

## 2017-02-24 DIAGNOSIS — Z886 Allergy status to analgesic agent status: Secondary | ICD-10-CM

## 2017-02-24 DIAGNOSIS — E871 Hypo-osmolality and hyponatremia: Secondary | ICD-10-CM | POA: Diagnosis present

## 2017-02-24 DIAGNOSIS — J441 Chronic obstructive pulmonary disease with (acute) exacerbation: Principal | ICD-10-CM | POA: Diagnosis present

## 2017-02-24 DIAGNOSIS — J690 Pneumonitis due to inhalation of food and vomit: Secondary | ICD-10-CM | POA: Diagnosis present

## 2017-02-24 DIAGNOSIS — Z882 Allergy status to sulfonamides status: Secondary | ICD-10-CM

## 2017-02-24 DIAGNOSIS — Z7902 Long term (current) use of antithrombotics/antiplatelets: Secondary | ICD-10-CM

## 2017-02-24 DIAGNOSIS — Z87891 Personal history of nicotine dependence: Secondary | ICD-10-CM

## 2017-02-24 LAB — URINALYSIS, ROUTINE W REFLEX MICROSCOPIC
Bacteria, UA: NONE SEEN
Bilirubin Urine: NEGATIVE
GLUCOSE, UA: NEGATIVE mg/dL
KETONES UR: NEGATIVE mg/dL
Leukocytes, UA: NEGATIVE
Nitrite: NEGATIVE
PH: 6 (ref 5.0–8.0)
Protein, ur: NEGATIVE mg/dL
Specific Gravity, Urine: 1.005 (ref 1.005–1.030)

## 2017-02-24 LAB — CBC WITH DIFFERENTIAL/PLATELET
BASOS PCT: 0 %
Basophils Absolute: 0 10*3/uL (ref 0.0–0.1)
EOS PCT: 0 %
Eosinophils Absolute: 0 10*3/uL (ref 0.0–0.7)
HEMATOCRIT: 30.4 % — AB (ref 36.0–46.0)
Hemoglobin: 10.3 g/dL — ABNORMAL LOW (ref 12.0–15.0)
Lymphocytes Relative: 48 %
Lymphs Abs: 3.7 10*3/uL (ref 0.7–4.0)
MCH: 29.9 pg (ref 26.0–34.0)
MCHC: 33.9 g/dL (ref 30.0–36.0)
MCV: 88.4 fL (ref 78.0–100.0)
MONO ABS: 1 10*3/uL (ref 0.1–1.0)
MONOS PCT: 12 %
NEUTROS ABS: 3.1 10*3/uL (ref 1.7–7.7)
Neutrophils Relative %: 40 %
PLATELETS: 204 10*3/uL (ref 150–400)
RBC: 3.44 MIL/uL — ABNORMAL LOW (ref 3.87–5.11)
RDW: 15.3 % (ref 11.5–15.5)
WBC: 7.8 10*3/uL (ref 4.0–10.5)

## 2017-02-24 LAB — COMPREHENSIVE METABOLIC PANEL
ALBUMIN: 3.7 g/dL (ref 3.5–5.0)
ALK PHOS: 107 U/L (ref 38–126)
ALT: 19 U/L (ref 14–54)
AST: 25 U/L (ref 15–41)
Anion gap: 11 (ref 5–15)
BILIRUBIN TOTAL: 0.4 mg/dL (ref 0.3–1.2)
BUN: 10 mg/dL (ref 6–20)
CALCIUM: 8.7 mg/dL — AB (ref 8.9–10.3)
CO2: 26 mmol/L (ref 22–32)
CREATININE: 0.79 mg/dL (ref 0.44–1.00)
Chloride: 94 mmol/L — ABNORMAL LOW (ref 101–111)
GFR calc Af Amer: >60 mL/min (ref >60)
GFR calc non Af Amer: >60 mL/min (ref >60)
Glucose, Bld: 135 mg/dL — ABNORMAL HIGH (ref 65–99)
Potassium: 3.4 mmol/L — ABNORMAL LOW (ref 3.5–5.1)
SODIUM: 131 mmol/L — AB (ref 135–145)
Total Protein: 7.1 g/dL (ref 6.5–8.1)

## 2017-02-24 LAB — TROPONIN I: Troponin I: <0.03 ng/mL (ref <0.03)

## 2017-02-24 LAB — BRAIN NATRIURETIC PEPTIDE: B NATRIURETIC PEPTIDE 5: 72.6 pg/mL (ref 0.0–100.0)

## 2017-02-24 MED ORDER — PANTOPRAZOLE SODIUM 40 MG PO TBEC
40.0000 mg | DELAYED_RELEASE_TABLET | Freq: Every day | ORAL | Status: DC
  Administered 2017-02-25 – 2017-03-04 (×8): 40 mg via ORAL
  Filled 2017-02-24 (×9): qty 1

## 2017-02-24 MED ORDER — GUAIFENESIN 100 MG/5ML PO SOLN
5.0000 mL | Freq: Once | ORAL | Status: AC
  Administered 2017-02-24: 100 mg via ORAL
  Filled 2017-02-24 (×2): qty 5

## 2017-02-24 MED ORDER — IRBESARTAN 150 MG PO TABS
300.0000 mg | ORAL_TABLET | Freq: Every day | ORAL | Status: DC
  Administered 2017-02-25 – 2017-03-04 (×8): 300 mg via ORAL
  Filled 2017-02-24 (×8): qty 2

## 2017-02-24 MED ORDER — METHYLPREDNISOLONE SODIUM SUCC 125 MG IJ SOLR
125.0000 mg | Freq: Once | INTRAMUSCULAR | Status: AC
  Administered 2017-02-24: 125 mg via INTRAVENOUS
  Filled 2017-02-24: qty 2

## 2017-02-24 MED ORDER — MONTELUKAST SODIUM 10 MG PO TABS
10.0000 mg | ORAL_TABLET | Freq: Every day | ORAL | Status: DC
  Administered 2017-02-25 – 2017-03-03 (×8): 10 mg via ORAL
  Filled 2017-02-24 (×8): qty 1

## 2017-02-24 MED ORDER — AMLODIPINE BESYLATE 5 MG PO TABS
5.0000 mg | ORAL_TABLET | Freq: Two times a day (BID) | ORAL | Status: DC
  Administered 2017-02-25 – 2017-03-04 (×16): 5 mg via ORAL
  Filled 2017-02-24 (×16): qty 1

## 2017-02-24 MED ORDER — MAGNESIUM HYDROXIDE 400 MG/5ML PO SUSP: 30.0000 mL | Freq: Every evening | ORAL | Status: DC | PRN

## 2017-02-24 MED ORDER — ATORVASTATIN CALCIUM 40 MG PO TABS
40.0000 mg | ORAL_TABLET | Freq: Every day | ORAL | Status: DC
  Administered 2017-02-25 – 2017-03-04 (×8): 40 mg via ORAL
  Filled 2017-02-24 (×8): qty 1

## 2017-02-24 MED ORDER — MOMETASONE FURO-FORMOTEROL FUM 200-5 MCG/ACT IN AERO
2.0000 | INHALATION_SPRAY | Freq: Two times a day (BID) | RESPIRATORY_TRACT | Status: DC
  Administered 2017-02-25 – 2017-02-27 (×6): 2 via RESPIRATORY_TRACT
  Filled 2017-02-24: qty 8.8

## 2017-02-24 MED ORDER — ONDANSETRON HCL 4 MG/2ML IJ SOLN
4.0000 mg | Freq: Four times a day (QID) | INTRAMUSCULAR | Status: DC | PRN
  Administered 2017-02-27: 4 mg via INTRAVENOUS
  Filled 2017-02-24: qty 2

## 2017-02-24 MED ORDER — IPRATROPIUM-ALBUTEROL 0.5-2.5 (3) MG/3ML IN SOLN
3.0000 mL | Freq: Four times a day (QID) | RESPIRATORY_TRACT | Status: DC
  Administered 2017-02-25: 3 mL via RESPIRATORY_TRACT
  Filled 2017-02-24: qty 3

## 2017-02-24 MED ORDER — FLUTICASONE PROPIONATE 50 MCG/ACT NA SUSP
2.0000 | Freq: Every day | NASAL | Status: DC
  Administered 2017-02-25 – 2017-03-04 (×8): 2 via NASAL
  Filled 2017-02-24: qty 16

## 2017-02-24 MED ORDER — FUROSEMIDE 20 MG PO TABS
20.0000 mg | ORAL_TABLET | Freq: Every day | ORAL | Status: DC
  Administered 2017-02-25 – 2017-03-04 (×8): 20 mg via ORAL
  Filled 2017-02-24 (×8): qty 1

## 2017-02-24 MED ORDER — ONDANSETRON HCL 4 MG PO TABS: 4.0000 mg | ORAL_TABLET | Freq: Four times a day (QID) | ORAL | Status: DC | PRN

## 2017-02-24 MED ORDER — ALBUTEROL SULFATE (2.5 MG/3ML) 0.083% IN NEBU
2.5000 mg | INHALATION_SOLUTION | RESPIRATORY_TRACT | Status: DC | PRN
  Administered 2017-02-26 – 2017-03-02 (×6): 2.5 mg via RESPIRATORY_TRACT
  Filled 2017-02-24 (×6): qty 3

## 2017-02-24 MED ORDER — SENNOSIDES-DOCUSATE SODIUM 8.6-50 MG PO TABS
1.0000 | ORAL_TABLET | Freq: Two times a day (BID) | ORAL | Status: DC
  Administered 2017-02-25 – 2017-03-04 (×16): 1 via ORAL
  Filled 2017-02-24 (×16): qty 1

## 2017-02-24 MED ORDER — METHYLPREDNISOLONE SODIUM SUCC 125 MG IJ SOLR
80.0000 mg | Freq: Two times a day (BID) | INTRAMUSCULAR | Status: DC
  Administered 2017-02-25: 80 mg via INTRAVENOUS
  Filled 2017-02-24: qty 2

## 2017-02-24 MED ORDER — DONEPEZIL HCL 10 MG PO TABS
10.0000 mg | ORAL_TABLET | Freq: Every day | ORAL | Status: DC
  Administered 2017-02-25 – 2017-03-03 (×8): 10 mg via ORAL
  Filled 2017-02-24 (×8): qty 1

## 2017-02-24 MED ORDER — ALBUTEROL (5 MG/ML) CONTINUOUS INHALATION SOLN
10.0000 mg/h | INHALATION_SOLUTION | Freq: Once | RESPIRATORY_TRACT | Status: AC
  Administered 2017-02-24: 10 mg/h via RESPIRATORY_TRACT
  Filled 2017-02-24: qty 20

## 2017-02-24 MED ORDER — FAMOTIDINE 20 MG PO TABS
20.0000 mg | ORAL_TABLET | Freq: Every day | ORAL | Status: DC
  Administered 2017-02-25 – 2017-03-03 (×8): 20 mg via ORAL
  Filled 2017-02-24 (×9): qty 1

## 2017-02-24 MED ORDER — LAMOTRIGINE 100 MG PO TABS
200.0000 mg | ORAL_TABLET | Freq: Two times a day (BID) | ORAL | Status: DC
  Administered 2017-02-25 – 2017-03-04 (×16): 200 mg via ORAL
  Filled 2017-02-24 (×16): qty 2

## 2017-02-24 MED ORDER — LORATADINE 10 MG PO TABS
10.0000 mg | ORAL_TABLET | Freq: Every day | ORAL | Status: DC
  Administered 2017-02-25 – 2017-03-04 (×8): 10 mg via ORAL
  Filled 2017-02-24 (×8): qty 1

## 2017-02-24 MED ORDER — MAGNESIUM OXIDE 400 (241.3 MG) MG PO TABS
400.0000 mg | ORAL_TABLET | Freq: Every day | ORAL | Status: DC
  Administered 2017-02-25 – 2017-03-04 (×8): 400 mg via ORAL
  Filled 2017-02-24 (×8): qty 1

## 2017-02-24 MED ORDER — GUAIFENESIN ER 600 MG PO TB12
600.0000 mg | ORAL_TABLET | Freq: Two times a day (BID) | ORAL | Status: DC
  Administered 2017-02-25 – 2017-03-04 (×16): 600 mg via ORAL
  Filled 2017-02-24 (×16): qty 1

## 2017-02-24 MED ORDER — DICYCLOMINE HCL 20 MG PO TABS
20.0000 mg | ORAL_TABLET | Freq: Two times a day (BID) | ORAL | Status: DC
  Administered 2017-02-25 – 2017-03-04 (×16): 20 mg via ORAL
  Filled 2017-02-24 (×17): qty 1

## 2017-02-24 MED ORDER — ENOXAPARIN SODIUM 40 MG/0.4ML ~~LOC~~ SOLN
40.0000 mg | Freq: Every day | SUBCUTANEOUS | Status: DC
  Administered 2017-02-25 – 2017-03-03 (×8): 40 mg via SUBCUTANEOUS
  Filled 2017-02-24 (×8): qty 0.4

## 2017-02-24 MED ORDER — CLOPIDOGREL BISULFATE 75 MG PO TABS
75.0000 mg | ORAL_TABLET | Freq: Every day | ORAL | Status: DC
  Administered 2017-02-25 – 2017-03-04 (×8): 75 mg via ORAL
  Filled 2017-02-24 (×8): qty 1

## 2017-02-24 MED ORDER — ACETAMINOPHEN 650 MG RE SUPP: 650.0000 mg | Freq: Four times a day (QID) | RECTAL | Status: DC | PRN

## 2017-02-24 MED ORDER — CEFTRIAXONE SODIUM 1 G IJ SOLR
1.0000 g | Freq: Once | INTRAMUSCULAR | Status: AC
  Administered 2017-02-24: 1 g via INTRAVENOUS
  Filled 2017-02-24: qty 10

## 2017-02-24 MED ORDER — AZITHROMYCIN 250 MG PO TABS
500.0000 mg | ORAL_TABLET | Freq: Once | ORAL | Status: AC
  Administered 2017-02-24: 500 mg via ORAL
  Filled 2017-02-24: qty 2

## 2017-02-24 MED ORDER — ALUM & MAG HYDROXIDE-SIMETH 200-200-20 MG/5ML PO SUSP
30.0000 mL | Freq: Four times a day (QID) | ORAL | Status: DC | PRN
Start: 2017-02-24 — End: 2017-03-04

## 2017-02-24 MED ORDER — LORAZEPAM 1 MG PO TABS
1.0000 mg | ORAL_TABLET | Freq: Two times a day (BID) | ORAL | Status: DC
Start: 2017-02-24 — End: 2017-03-04
  Administered 2017-02-25 – 2017-03-04 (×16): 1 mg via ORAL
  Filled 2017-02-24 (×16): qty 1

## 2017-02-24 MED ORDER — BIOTENE DRY MOUTH MT LIQD
15.0000 mL | Freq: Every day | OROMUCOSAL | Status: DC
  Administered 2017-02-25 – 2017-03-02 (×3): 15 mL via OROMUCOSAL

## 2017-02-24 MED ORDER — PHENYLEPH-SHARK LIV OIL-MO-PET 0.25-3-14-71.9 % RE OINT: 1.0000 "application " | TOPICAL_OINTMENT | Freq: Two times a day (BID) | RECTAL | Status: DC | PRN

## 2017-02-24 MED ORDER — QUETIAPINE FUMARATE 25 MG PO TABS
150.0000 mg | ORAL_TABLET | Freq: Two times a day (BID) | ORAL | Status: DC
  Administered 2017-02-25 – 2017-03-04 (×16): 150 mg via ORAL
  Filled 2017-02-24 (×20): qty 2

## 2017-02-24 MED ORDER — ACETAMINOPHEN 325 MG PO TABS
650.0000 mg | ORAL_TABLET | Freq: Four times a day (QID) | ORAL | Status: DC | PRN
  Administered 2017-02-26 – 2017-03-04 (×4): 650 mg via ORAL
  Filled 2017-02-24 (×4): qty 2

## 2017-02-24 MED ORDER — LEVOFLOXACIN 750 MG PO TABS
750.0000 mg | ORAL_TABLET | Freq: Every day | ORAL | Status: AC
  Administered 2017-02-25 – 2017-03-01 (×5): 750 mg via ORAL
  Filled 2017-02-24 (×5): qty 1

## 2017-02-24 MED ORDER — VITAMINS A & D EX OINT
1.0000 "application " | TOPICAL_OINTMENT | Freq: Four times a day (QID) | CUTANEOUS | Status: DC | PRN
  Filled 2017-02-24: qty 5

## 2017-02-24 MED ORDER — FERROUS SULFATE 325 (65 FE) MG PO TABS
325.0000 mg | ORAL_TABLET | Freq: Every day | ORAL | Status: DC
  Administered 2017-02-25 – 2017-03-04 (×8): 325 mg via ORAL
  Filled 2017-02-24 (×8): qty 1

## 2017-02-24 NOTE — H&P (Signed)
History and Physical    Cindy Padilla ZOX:[id number] DOB: 06-Jan-1941 DOA: 02/24/2017  PCP: Ron Parker, MD Consultants:  Tyler Aas, [facility] Patient coming from:  Assisted Living, [name]; [facility]: Daughter   Chief Complaint: SOB  HPI: Cindy Padilla is a 76 y.o. female with medical history significant of HTN; DM; dementia; COPD not on home O2; and bipolar d/o presenting with SOB.  She reports "staying sick".  She started getting sick Thursday with congestion going on for days.  She "couldn't get any cough syrup, my daughter was supposed to bring me a bottle". She finally took some "house brand" cough syrup.  "I've always had problems with my breathing, I've got a nebulizer right beside my bed".  The doctor decided that she needed to come to the ER today because she was concerned that she had pneumonia.  "Well, I wasn't getting better there".  She reports that she had a fever at the facilty.   ED Course: Not hypoxic but very SOB with movement.  2 continuous nebs, Guaifenesin, Solumedrol 125, Rocephin, Azithromycin  Review of Systems: As per HPI; otherwise review of systems reviewed and negative.   Ambulatory Status:   Ambulates with a walker  Past Medical History:  Diagnosis Date  . Arthritis   . Bipolar 1 disorder (HCC)   . Bronchitis   . Chronic back pain   . Constipation   . COPD (chronic obstructive pulmonary disease) (HCC)   . Dementia   . Diabetes mellitus   . GERD (gastroesophageal reflux disease)   . Hiatal hernia   . Hypertension   . IBS (irritable bowel syndrome)     Past Surgical History:  Procedure Laterality Date  . gallbladder    . MANDIBLE SURGERY    . PARTIAL HYSTERECTOMY      Social History   Social History  . Marital status: Divorced    Spouse name: N/A  . Number of children: 2  . Years of education: 7 th   Occupational History  . retired     retired   Social History Main Topics  . Smoking status: Former Smoker    Packs/day:  0.50    Years: 2.00    Types: Cigarettes    Quit date: 05/28/1959  . Smokeless tobacco: Former Neurosurgeon    Types: Snuff    Quit date: 05/28/1959     Comment: Pt used snuff for 33 years  . Alcohol use No  . Drug use: No  . Sexual activity: No   Other Topics Concern  . Not on file   Social History Narrative   Patient lives [facility].   Retired   Education 7 th grade   Right handed.   Caffeine one cup of coffee daily    Allergies  Allergen Reactions  . Aminoglycosides Other (See Comments)    Reaction:  Unknown   . Aspirin Other (See Comments)    Reaction:  Unknown   . Codeine Other (See Comments)    Reaction:  Unknown   . Ivp Dye [Iodinated Diagnostic Agents] Other (See Comments)    Reaction:  Unknown   . Morphine And Related Other (See Comments)    Reaction:  Unknown   . Neomycin Other (See Comments)    Reaction:  Unknown   . Penicillins Rash and Other (See Comments)    Has patient had a PCN reaction causing immediate rash, facial/tongue/throat swelling, SOB or lightheadedness with hypotension: No Has patient had a PCN reaction causing  severe rash involving mucus membranes or skin necrosis: No Has patient had a PCN reaction that required hospitalization No Has patient had a PCN reaction occurring within the last 10 years: No If all of the above answers are "NO", then may proceed with Cephalosporin use.  Marland Kitchen Phenothiazines Other (See Comments)    Reaction:  Unknown   . Promethazine Other (See Comments)    Reaction:  Unknown   . Sulfa Antibiotics Other (See Comments)    Reaction:  Unknown   . Tetracyclines & Related Other (See Comments)    Reaction: Unknown     Family History  Problem Relation Age of Onset  . Emphysema Mother   . Cancer Other     Prior to Admission medications   Medication Sig Start Date End Date Taking? Authorizing Provider  acetaminophen (TYLENOL) 325 MG tablet Take 650 mg by mouth 3 (three) times daily.   Yes [provider]   albuterol (PROVENTIL HFA;VENTOLIN HFA) 108 (90 BASE) MCG/ACT inhaler Inhale 2 puffs into the lungs every 4 (four) hours as needed for wheezing or shortness of breath.   Yes [provider]  alum & mag hydroxide-simeth (GERI-LANTA) 200-200-20 MG/5ML suspension Take 30 mLs by mouth 4 (four) times daily as needed for indigestion or heartburn.    Yes [provider]  amLODipine (NORVASC) 5 MG tablet Take 5 mg by mouth 2 (two) times daily.    Yes [provider]  antiseptic oral rinse (BIOTENE) LIQD 15 mLs by Mouth Rinse route at bedtime. Swish and spit   Yes [provider]  atorvastatin (LIPITOR) 40 MG tablet Take 40 mg by mouth daily.    Yes [provider]  budesonide-formoterol (SYMBICORT) 160-4.5 MCG/ACT inhaler Inhale 2 puffs into the lungs 2 (two) times daily.    Yes [provider]  cholecalciferol (VITAMIN D) 1000 UNITS tablet Take 1,000 Units by mouth daily.    Yes [provider]  clopidogrel (PLAVIX) 75 MG tablet Take 75 mg by mouth daily.    Yes [provider]  dicyclomine (BENTYL) 20 MG tablet Take 1 tablet (20 mg total) by mouth 2 (two) times daily. 10/31/16  Yes [name], MD  donepezil (ARICEPT) 10 MG tablet Take 10 mg by mouth at bedtime.    Yes [provider]  esomeprazole (NEXIUM) 40 MG capsule Take 40 mg by mouth daily before breakfast.   Yes [provider]  ferrous sulfate 325 (65 FE) MG tablet Take 325 mg by mouth daily with breakfast.   Yes [provider]  fexofenadine (ALLEGRA) 180 MG tablet Take 180 mg by mouth daily.   Yes [provider]  fluticasone (FLONASE) 50 MCG/ACT nasal spray Place 2 sprays into both nostrils daily.    Yes [provider]  furosemide (LASIX) 20 MG tablet Take 20 mg by mouth daily with breakfast.   Yes [provider]  ipratropium-albuterol (DUONEB) 0.5-2.5 (3) MG/3ML SOLN Take 3 mLs by nebulization every 8 (eight)  hours.    Yes [provider]  lamoTRIgine (LAMICTAL) 200 MG tablet Take 200 mg by mouth 2 (two) times daily.   Yes [provider]  lidocaine (ASPERCREME W/LIDOCAINE) 4 % cream Apply 1 application topically 3 (three) times daily as needed (for pain).    Yes [provider]  loperamide (IMODIUM) 2 MG capsule Take 2 mg by mouth every 3 (three) hours as needed for diarrhea or loose stools.    Yes [provider]  LORazepam (ATIVAN)  1 MG tablet Take 1 tablet (1 mg total) by mouth every 12 (twelve) hours. 06/15/16  Yes [name], MD  magnesium hydroxide (MILK OF MAGNESIA) 400 MG/5ML suspension Take 30 mLs by mouth at bedtime as needed for mild constipation.   Yes [provider]  magnesium oxide (MAG-OX) 400 (241.3 Mg) MG tablet Take 400 mg by mouth daily.   Yes [provider]  miconazole (BAZA ANTIFUNGAL) 2 % cream Apply 1 application topically daily.   Yes [provider]  montelukast (SINGULAIR) 10 MG tablet Take 10 mg by mouth at bedtime.   Yes [provider]  phenylephrine-shark liver oil-mineral oil-petrolatum (PREPARATION H) 0.25-3-14-71.9 % rectal ointment Place 1 application rectally 2 (two) times daily as needed for hemorrhoids.   Yes [provider]  QUEtiapine (SEROQUEL) 100 MG tablet Take 150 mg by mouth 2 (two) times daily.    Yes [provider]  ranitidine (ZANTAC) 150 MG tablet Take 150 mg by mouth at bedtime.    Yes [provider]  senna-docusate (SENOKOT-S) 8.6-50 MG per tablet Take 1 tablet by mouth 2 (two) times daily.   Yes [provider]  triamcinolone cream (KENALOG) 0.1 % Apply 1 application topically daily as needed (for irritation).    Yes [provider]  valsartan (DIOVAN) 320 MG tablet Take 1 tablet (320 mg total) by mouth daily. 04/16/13  Yes Nyoka Cowden, MD  Vitamins A & D (VITAMIN A & D) ointment Apply 1 application topically every 6 (six) hours  as needed (for rectal pain).    Yes [provider]  guaiFENesin (MUCINEX) 600 MG 12 hr tablet Take 1 tablet (600 mg total) by mouth 2 (two) times daily. Patient not taking: Reported on 02/24/2017 06/15/16   [name], MD  ibuprofen (ADVIL,MOTRIN) 400 MG tablet Take 1 tablet (400 mg total) by mouth every 6 (six) hours as needed for mild pain. Patient not taking: Reported on 02/24/2017 10/03/16   Marily Memos, MD    Physical Exam: Vitals:   02/24/17 1930 02/24/17 2000 02/24/17 2233 02/24/17 2255  BP: (!) 144/74 (!) 164/73 (!) 160/78   Pulse: 78 81  (!) 101  Resp: (!) Temp:      TempSrc:      SpO2: 93% 91%  91%  Weight:      Height:         General:  Appears anxious and jittery and is NAD Eyes:  PERRL, EOMI, normal lids, iris ENT:  grossly normal hearing, lips & tongue, mmm; appropriate dentition (she reports that she paid $12,000 for those dentures) Neck:  no LAD, masses or thyromegaly; no carotid bruits Cardiovascular:  RRR, no m/r/g. No LE edema.  Respiratory: Diffuse wheezing with relatively poor air movement.  Normal respiratory effort. Abdomen:  soft, NT, ND, NABS Back:   normal alignment, no CVAT Skin:  no rash or induration seen on limited exam Musculoskeletal:  grossly normal tone BUE/BLE, good ROM, no bony abnormality Psychiatric:  Anxious, pressured mood and affect, speech pressured but generally appropriate, AOx3 Neurologic:  CN 2-12 grossly intact, moves all extremities in coordinated fashion, sensation intact; tremulous/jittery    Radiological Exams on Admission: Dg Chest 2 View  Result Date: 02/24/2017 CLINICAL DATA:  Patient with cough and shortness of breath. EXAM: CHEST  2 VIEW COMPARISON:  Chest radiograph 06/13/2016 FINDINGS: Multiple monitoring leads overlie the patient. Stimulator pack projects over the left hemithorax. Stable cardiac and mediastinal contours. Elevation right hemidiaphragm.  No consolidative pulmonary opacities. No  pleural effusion or pneumothorax. Thoracic spine degenerative changes. IMPRESSION: No acute cardiopulmonary process. Electronically Signed   By: Annia Belt M.D.   On: 02/24/2017 16:58    EKG: Independently reviewed.  NSR with rate 97; prolonged QT 511;  nonspecific ST changes with no evidence of acute ischemia   Labs on Admission: I have personally reviewed the available labs and imaging studies at the time of the admission.  Pertinent labs:   Na++ 131 Glucose 135 BNP 72.6 Troponin <0.03 Hgb 10.3, prior 10.7 in 6/18 UA: small Hgb, otherwise unremarkable  Assessment/Plan Principal Problem:   COPD with acute exacerbation (HCC) Active Problems:   HTN (hypertension)   Dementia with behavioral disturbance   Bipolar 1 disorder (HCC)   Normocytic anemia   COPD -Patient's shortness of breath and wheezing are most likely caused by acute COPD exacerbation.  -Although patient has a tightening across her chest with coughing, she does not have current fever or leukocytosis. Chest x-ray is not consistent with pneumonia -She was given a continuous neb treatment x 2 in the ED with some improvement but ongoing marked wheezing. -will observe patient for now - if she has rapid improvement she may be appropriate for discharge in the next 1-2 days  -Nebulizers: scheduled Duoneb and prn albuterol -Solu-Medrol 80 mg IV BID  -Oral levaquin (she was given Rocephin and Azithromycin in the ER but does not have apparent pneumonia) -Mucinex BID  HTN -Continue Norvasc, Diovan  Dementia/bipolar -Continue Aricept, Lamictal, Ativan  Anemia -Appears to be relatively stable -Will follow with repeat CBC in AM   DVT prophylaxis: Lovenox  Code Status:  Full - confirmed with patient Family Communication: None present Disposition Plan:   Back to AL once clinically improved Consults called: SW, CM, Nutrition, RT, PT, OT Admission status: It is my clinical opinion that referral for OBSERVATION is  reasonable and necessary in this patient based on the above information provided. The aforementioned taken together are felt to place the patient at high risk for further clinical deterioration. However it is anticipated that the patient may be medically stable for discharge from the hospital within 24 to 48 hours.    Jonah Blue MD [facility]  If note is complete, please contact covering daytime or nighttime physician. [email or web address] Password [email or web address]  02/24/2017, 11:32 PM

## 2017-02-24 NOTE — ED Notes (Signed)
Bed: WA18 Expected date:  Expected time:  Means of arrival:  Comments: ems 

## 2017-02-24 NOTE — ED Notes (Signed)
Per MD order pulse ox while ambulating. Pt assisted oob and walked from rm18 to restroom. Pulse ox measured at 94pulse, 93% RA standing/walking. RN advised. ENM

## 2017-02-24 NOTE — Care Management Note (Signed)
Case Management Note  Pt from [facility]F. Active with [facility] RN only per liaison, Tim.

## 2017-02-24 NOTE — ED Notes (Signed)
Pharmacy called about missing robitussin that was due 1545. They are sending it now.

## 2017-02-24 NOTE — ED Notes (Signed)
ED Provider at bedside. 

## 2017-02-24 NOTE — ED Triage Notes (Addendum)
Per EMS, pt from [facility] complains of SOB and cough x 4 days. Pt has hx of asthma and COPD. Cough is nonproductive. Pt has received 10 albuterol, .5 Atrovent. Pt is DNR.   O2 93% on RA, 100 on 8L albuterol treatment.  BP 159/63 HR 84 RR 24

## 2017-02-24 NOTE — Clinical Social Work Note (Signed)
Clinical Social Work Assessment  Patient Details  Name: Cindy Padilla MRN: [id number] Date of Birth: Nov 12, 1940  Date of referral:  02/24/17               Reason for consult:  Facility Placement                Permission sought to share information with:  Facility Art therapist granted to share information::  Yes, Verbal Permission Granted  Name::        Agency::     Relationship::     Contact Information:     Housing/Transportation Living arrangements for the past 2 months:  [place] of Information:  Patient Patient Interpreter Needed:  None Criminal Activity/Legal Involvement Pertinent to Current Situation/Hospitalization:    Significant Relationships:  Adult Children Lives with:  Facility Resident Do you feel safe going back to the place where you live?  Yes Need for family participation in patient care:  No (Coment)  Care giving concerns:  None listed by pt/family   Social Worker assessment / plan:  CSW met with pt and confirmed pt's plan to be discharged back to [place] at D/C unless PT recommends placement to SNF to live at discharge.  CSW provided active listening and validated pt's concerns.   CSW DEPT was given permission to complete FL-2 and send referrals out to SNF facilities via the hub per pt's request.  Pt has been living at [facility] for 4 1/2 years, prior to being admitted to [facility].    Employment status:  Retired Forensic scientist:  Medicare PT Recommendations:  Not assessed at this time Information / Referral to community resources:     Patient/Family's Response to care:  Patient alert and oriented.  Patient agreeable to plan.  Pt's daughter supportive and strongly involved in pt.'s care.  Pt pleasant and appreciated CSW intervention.    Patient/Family's Understanding of and Emotional Response to Diagnosis, Current Treatment, and Prognosis:  Still assessing   Emotional Assessment Appearance:     Attitude/Demeanor/Rapport:    Affect (typically observed):  Accepting, Adaptable, Calm, Pleasant Orientation:  Oriented to Self, Oriented to Place, Oriented to  Time, Oriented to Situation Alcohol / Substance use:    Psych involvement (Current and /or in the community):     Discharge Needs  Concerns to be addressed:    Readmission within the last 30 days:  No Current discharge risk:  None Barriers to Discharge:  No Barriers Identified   Claudine Mouton, LCSWA 02/24/2017, 11:31 PM

## 2017-02-24 NOTE — ED Provider Notes (Signed)
[facility]-EMERGENCY DEPT Provider Note   CSN: [id number] Arrival date & time: 02/24/17  1504     History   Chief Complaint No chief complaint on file.   HPI Cindy Padilla is a 76 y.o. female.  Pt presents to the ED today with sob and cough.  The pt said sx have been going on for the past 4 days.  She said cough has been nonproductive.  The pt did receive 10 mg albuterol and 5 mg of atrovent en route by EMS.  No f/c.      Past Medical History:  Diagnosis Date  . Arthritis   . Bipolar 1 disorder (HCC)   . Bronchitis   . Chronic back pain   . Constipation   . COPD (chronic obstructive pulmonary disease) (HCC)   . Dementia   . Diabetes mellitus   . GERD (gastroesophageal reflux disease)   . Hiatal hernia   . Hypertension   . IBS (irritable bowel syndrome)     Patient Active Problem List   Diagnosis Date Noted  . Streptococcus pneumoniae pneumonia (HCC) 06/15/2016  . Sepsis (HCC) 06/10/2016  . Morbid obesity (HCC) 04/14/2015  . Upper airway cough syndrome 04/14/2015  . Ventral hernia 04/24/2014  . Constipation 04/24/2014  . Abdominal pain, epigastric 04/24/2014  . Partial small bowel obstruction (HCC) 04/22/2014  . Hemorrhoids 04/22/2014  . Abnormality of gait 09/30/2013  . 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 Diastolic heart failure (HCC) 07/27/2013  . Dementia with behavioral disturbance 07/27/2013  . HCAP (healthcare-associated pneumonia) 07/24/2013  . Acute respiratory failure (HCC) 02/21/2013  . Weakness 02/09/2013  . Hypokalemia 02/09/2013  . Hyponatremia 02/09/2013  . Shortness of breath 02/09/2013  . HTN (hypertension) 02/09/2013  . Other and unspecified hyperlipidemia 02/09/2013    Past Surgical History:  Procedure Laterality Date  . gallbladder    . MANDIBLE SURGERY    . PARTIAL HYSTERECTOMY      OB History    No data available       Home Medications    Prior to Admission medications   Medication  Sig Start Date End Date Taking? Authorizing Provider  acetaminophen (TYLENOL) 325 MG tablet Take 650 mg by mouth 3 (three) times daily.   Yes [provider]  albuterol (PROVENTIL HFA;VENTOLIN HFA) 108 (90 BASE) MCG/ACT inhaler Inhale 2 puffs into the lungs every 4 (four) hours as needed for wheezing or shortness of breath.   Yes [provider]  alum & mag hydroxide-simeth (GERI-LANTA) 200-200-20 MG/5ML suspension Take 30 mLs by mouth 4 (four) times daily as needed for indigestion or heartburn.    Yes [provider]  amLODipine (NORVASC) 5 MG tablet Take 5 mg by mouth 2 (two) times daily.    Yes [provider]  antiseptic oral rinse (BIOTENE) LIQD 15 mLs by Mouth Rinse route at bedtime. Swish and spit   Yes [provider]  atorvastatin (LIPITOR) 40 MG tablet Take 40 mg by mouth daily.    Yes [provider]  budesonide-formoterol (SYMBICORT) 160-4.5 MCG/ACT inhaler Inhale 2 puffs into the lungs 2 (two) times daily.    Yes [provider]  cholecalciferol (VITAMIN D) 1000 UNITS tablet Take 1,000 Units by mouth daily.    Yes [provider]  clopidogrel (PLAVIX) 75 MG tablet Take 75 mg by mouth daily.    Yes [provider]  dicyclomine (BENTYL) 20 MG tablet Take 1 tablet (20 mg total) by mouth 2 (two) times  daily. 10/31/16  Yes [name], MD  donepezil (ARICEPT) 10 MG tablet Take 10 mg by mouth at bedtime.    Yes [provider]  esomeprazole (NEXIUM) 40 MG capsule Take 40 mg by mouth daily before breakfast.   Yes [provider]  ferrous sulfate 325 (65 FE) MG tablet Take 325 mg by mouth daily with breakfast.   Yes [provider]  fexofenadine (ALLEGRA) 180 MG tablet Take 180 mg by mouth daily.   Yes [provider]  fluticasone (FLONASE) 50 MCG/ACT nasal spray Place 2 sprays into both nostrils daily.    Yes [provider]  furosemide (LASIX) 20 MG tablet Take 20 mg  by mouth daily with breakfast.   Yes [provider]  ipratropium-albuterol (DUONEB) 0.5-2.5 (3) MG/3ML SOLN Take 3 mLs by nebulization every 8 (eight) hours.    Yes [provider]  lamoTRIgine (LAMICTAL) 200 MG tablet Take 200 mg by mouth 2 (two) times daily.   Yes [provider]  lidocaine (ASPERCREME W/LIDOCAINE) 4 % cream Apply 1 application topically 3 (three) times daily as needed (for pain).    Yes [provider]  loperamide (IMODIUM) 2 MG capsule Take 2 mg by mouth every 3 (three) hours as needed for diarrhea or loose stools.    Yes [provider]  LORazepam (ATIVAN) 1 MG tablet Take 1 tablet (1 mg total) by mouth every 12 (twelve) hours. 06/15/16  Yes [name], MD  magnesium hydroxide (MILK OF MAGNESIA) 400 MG/5ML suspension Take 30 mLs by mouth at bedtime as needed for mild constipation.   Yes [provider]  magnesium oxide (MAG-OX) 400 (241.3 Mg) MG tablet Take 400 mg by mouth daily.   Yes [provider]  miconazole (BAZA ANTIFUNGAL) 2 % cream Apply 1 application topically daily.   Yes [provider]  montelukast (SINGULAIR) 10 MG tablet Take 10 mg by mouth at bedtime.   Yes [provider]  phenylephrine-shark liver oil-mineral oil-petrolatum (PREPARATION H) 0.25-3-14-71.9 % rectal ointment Place 1 application rectally 2 (two) times daily as needed for hemorrhoids.   Yes [provider]  QUEtiapine (SEROQUEL) 100 MG tablet Take 150 mg by mouth 2 (two) times daily.    Yes [provider]  ranitidine (ZANTAC) 150 MG tablet Take 150 mg by mouth at bedtime.    Yes [provider]  senna-docusate (SENOKOT-S) 8.6-50 MG per tablet Take 1 tablet by mouth 2 (two) times daily.   Yes [provider]  triamcinolone cream (KENALOG) 0.1 % Apply 1 application topically daily as needed (for irritation).    Yes [provider]  valsartan (DIOVAN) 320 MG tablet Take 1  tablet (320 mg total) by mouth daily. 04/16/13  Yes Nyoka Cowden, MD  Vitamins A & D (VITAMIN A & D) ointment Apply 1 application topically every 6 (six) hours as needed (for rectal pain).    Yes [provider]  guaiFENesin (MUCINEX) 600 MG 12 hr tablet Take 1 tablet (600 mg total) by mouth 2 (two) times daily. Patient not taking: Reported on 02/24/2017 06/15/16   [name], MD  ibuprofen (ADVIL,MOTRIN) 400 MG tablet Take 1 tablet (400 mg total) by mouth every 6 (six) hours as needed for mild pain. Patient not taking: Reported on 02/24/2017 10/03/16   Mesner, Barbara Cower, MD    Family History Family History  Problem Relation Age of Onset  . Emphysema Mother   . Cancer Other     Social History  Social History  Substance Use Topics  . Smoking status: Former Smoker    Packs/day: 0.50    Years: 2.00    Types: Cigarettes    Quit date: 05/28/1959  . Smokeless tobacco: Former Neurosurgeon    Types: Snuff    Quit date: 05/28/1959     Comment: Pt used snuff as a teenager  . Alcohol use No     Allergies   Aminoglycosides; Aspirin; Codeine; Ivp dye [iodinated diagnostic agents]; Morphine and related; Neomycin; Penicillins; Phenothiazines; Promethazine; Sulfa antibiotics; and Tetracyclines & related   Review of Systems Review of Systems  Respiratory: Positive for cough, shortness of breath and wheezing.   All other systems reviewed and are negative.    Physical Exam Updated Vital Signs BP (!) 177/66 (BP Location: Right Arm)   Pulse 78   Temp 98 F (36.7 C) (Oral)   Resp 20   Ht 5' (1.524 m)   Wt 88.5 kg (195 lb)   SpO2 95%   BMI 38.08 kg/m   Physical Exam  Constitutional: She is oriented to person, place, and time. She appears well-developed. She appears distressed.  HENT:  Head: Normocephalic and atraumatic.  Right Ear: External ear normal.  Left Ear: External ear normal.  Nose: Nose normal.  Mouth/Throat: Oropharynx is clear and moist.  Eyes: Pupils are equal, round,  and reactive to light. Conjunctivae and EOM are normal.  Neck: Normal range of motion. Neck supple.  Cardiovascular: Regular rhythm.   Pulmonary/Chest: Tachypnea noted. She has wheezes. She has rhonchi. She has rales.  Abdominal: Soft. Bowel sounds are normal.  Musculoskeletal: Normal range of motion.  Neurological: She is alert and oriented to person, place, and time.  Skin: Skin is warm.  Psychiatric: She has a normal mood and affect. Her behavior is normal. Judgment and thought content normal.  Nursing note and vitals reviewed.    ED Treatments / Results  Labs (all labs ordered are listed, but only abnormal results are displayed) Labs Reviewed  COMPREHENSIVE METABOLIC PANEL - Abnormal; Notable for the following:       Result Value   Sodium 131 (*)    Potassium 3.4 (*)    Chloride 94 (*)    Glucose, Bld 135 (*)    Calcium 8.7 (*)    All other components within normal limits  CBC WITH DIFFERENTIAL/PLATELET - Abnormal; Notable for the following:    RBC 3.44 (*)    Hemoglobin 10.3 (*)    HCT 30.4 (*)    All other components within normal limits  URINALYSIS, ROUTINE W REFLEX MICROSCOPIC - Abnormal; Notable for the following:    Color, Urine STRAW (*)    Hgb urine dipstick SMALL (*)    Squamous Epithelial / LPF 0-5 (*)    All other components within normal limits  TROPONIN I  BRAIN NATRIURETIC PEPTIDE    EKG  EKG Interpretation  Date/Time:  Monday February 24 2017 15:34:59 EDT Ventricular Rate:  97 PR Interval:    QRS Duration: 96 QT Interval:  402 QTC Calculation: 511 R Axis:   41 Text Interpretation:  Sinus rhythm Low voltage, precordial leads Nonspecific T abnormalities, lateral leads Prolonged QT interval No significant change since last tracing Confirmed by Jacalyn Lefevre [phone]) on 02/24/2017 4:07:56 PM       Radiology Dg Chest 2 View  Result Date: 02/24/2017 CLINICAL DATA:  Patient with cough and shortness of breath. EXAM: CHEST  2 VIEW COMPARISON:  Chest  radiograph 06/13/2016 FINDINGS: Multiple monitoring leads  overlie the patient. Stimulator pack projects over the left hemithorax. Stable cardiac and mediastinal contours. Elevation right hemidiaphragm. No consolidative pulmonary opacities. No pleural effusion or pneumothorax. Thoracic spine degenerative changes. IMPRESSION: No acute cardiopulmonary process. Electronically Signed   By: Annia Belt M.D.   On: 02/24/2017 16:58    Procedures Procedures (including critical care time)  Medications Ordered in ED Medications  guaiFENesin (ROBITUSSIN) 100 MG/5ML solution 100 mg (not administered)  albuterol (PROVENTIL,VENTOLIN) solution continuous neb (not administered)  methylPREDNISolone sodium succinate (SOLU-MEDROL) 125 mg/2 mL injection 125 mg (125 mg Intravenous Given 02/24/17 1554)  cefTRIAXone (ROCEPHIN) 1 g in dextrose 5 % 50 mL IVPB (0 g Intravenous Stopped 02/24/17 1925)  azithromycin (ZITHROMAX) tablet 500 mg (500 mg Oral Given 02/24/17 1807)     Initial Impression / Assessment and Plan / ED Course  I have reviewed the triage vital signs and the nursing notes.  Pertinent labs & imaging results that were available during my care of the patient were reviewed by me and considered in my medical decision making (see chart for details).    Sob has not improved much.  She is still extremely sob with movement.  She has been on nebs at the SNF without help.  Pt d/w Dr. Ophelia Charter ([facility]) for admission.  Final Clinical Impressions(s) / ED Diagnoses   Final diagnoses:  Moderate persistent asthma with exacerbation  Acute bronchitis, unspecified organism    New Prescriptions New Prescriptions   No medications on file     Jacalyn Lefevre, MD 02/24/17 1956

## 2017-02-24 NOTE — ED Notes (Signed)
Pt ambulated to restroom without assistance. Pt's oxygen saturation was 95% RA when she returned to bed.

## 2017-02-25 DIAGNOSIS — J441 Chronic obstructive pulmonary disease with (acute) exacerbation: Secondary | ICD-10-CM | POA: Diagnosis not present

## 2017-02-25 LAB — CBC
HCT: 30.6 % — ABNORMAL LOW (ref 36.0–46.0)
Hemoglobin: 10.3 g/dL — ABNORMAL LOW (ref 12.0–15.0)
MCH: 29.6 pg (ref 26.0–34.0)
MCHC: 33.7 g/dL (ref 30.0–36.0)
MCV: 87.9 fL (ref 78.0–100.0)
Platelets: 197 K/uL (ref 150–400)
RBC: 3.48 MIL/uL — ABNORMAL LOW (ref 3.87–5.11)
RDW: 15.2 % (ref 11.5–15.5)
WBC: 6.3 K/uL (ref 4.0–10.5)

## 2017-02-25 LAB — BASIC METABOLIC PANEL WITH GFR
Anion gap: 13 (ref 5–15)
BUN: 13 mg/dL (ref 6–20)
CO2: 20 mmol/L — ABNORMAL LOW (ref 22–32)
Calcium: 9.1 mg/dL (ref 8.9–10.3)
Chloride: 97 mmol/L — ABNORMAL LOW (ref 101–111)
Creatinine, Ser: 0.74 mg/dL (ref 0.44–1.00)
GFR calc Af Amer: >60 mL/min (ref >60)
GFR calc non Af Amer: >60 mL/min (ref >60)
Glucose, Bld: 178 mg/dL — ABNORMAL HIGH (ref 65–99)
Potassium: 4.1 mmol/L (ref 3.5–5.1)
Sodium: 130 mmol/L — ABNORMAL LOW (ref 135–145)

## 2017-02-25 LAB — MRSA PCR SCREENING: MRSA by PCR: NEGATIVE

## 2017-02-25 MED ORDER — IPRATROPIUM-ALBUTEROL 0.5-2.5 (3) MG/3ML IN SOLN
3.0000 mL | RESPIRATORY_TRACT | Status: DC
  Filled 2017-02-25: qty 3

## 2017-02-25 MED ORDER — IPRATROPIUM-ALBUTEROL 0.5-2.5 (3) MG/3ML IN SOLN
3.0000 mL | Freq: Four times a day (QID) | RESPIRATORY_TRACT | Status: DC
  Administered 2017-02-26 – 2017-03-04 (×25): 3 mL via RESPIRATORY_TRACT
  Filled 2017-02-25 (×26): qty 3

## 2017-02-25 MED ORDER — IPRATROPIUM-ALBUTEROL 0.5-2.5 (3) MG/3ML IN SOLN
3.0000 mL | Freq: Three times a day (TID) | RESPIRATORY_TRACT | Status: DC
  Administered 2017-02-25: 3 mL via RESPIRATORY_TRACT

## 2017-02-25 MED ORDER — GUAIFENESIN-DM 100-10 MG/5ML PO SYRP
5.0000 mL | ORAL_SOLUTION | ORAL | Status: DC | PRN
  Administered 2017-02-25 – 2017-03-04 (×19): 5 mL via ORAL
  Filled 2017-02-25 (×20): qty 10

## 2017-02-25 MED ORDER — METHYLPREDNISOLONE SODIUM SUCC 125 MG IJ SOLR
80.0000 mg | Freq: Three times a day (TID) | INTRAMUSCULAR | Status: DC
  Administered 2017-02-25 – 2017-03-01 (×11): 80 mg via INTRAVENOUS
  Filled 2017-02-25 (×12): qty 2

## 2017-02-25 MED ORDER — LORAZEPAM 1 MG PO TABS
1.0000 mg | ORAL_TABLET | Freq: Once | ORAL | Status: AC
  Administered 2017-02-25: 1 mg via ORAL
  Filled 2017-02-25: qty 1

## 2017-02-25 MED ORDER — IPRATROPIUM-ALBUTEROL 0.5-2.5 (3) MG/3ML IN SOLN
3.0000 mL | Freq: Four times a day (QID) | RESPIRATORY_TRACT | Status: DC
  Administered 2017-02-25: 3 mL via RESPIRATORY_TRACT
  Filled 2017-02-25 (×2): qty 3

## 2017-02-25 NOTE — Evaluation (Signed)
Occupational Therapy Evaluation Patient Details Name: Cindy Padilla MRN: [id number] DOB: Feb 26, 1941 Today's Date: 02/25/2017    History of Present Illness  Cindy Padilla is a 76 y.o. female with medical history significant of HTN; DM; dementia; COPD not on home O2; and bipolar d/o presenting with SOB.    Clinical Impression   Pt admitted with SOB. Pt currently with functional limitations due to the deficits listed below (see OT Problem List).  Pt will benefit from skilled OT to increase their safety and independence with ADL and functional mobility for ADL to facilitate discharge to venue listed below.      Follow Up Recommendations  Home health OT    Equipment Recommendations  None recommended by OT       Precautions / Restrictions Precautions Precautions: Fall Precaution Comments: watch O2 sats      Mobility Bed Mobility Overal bed mobility: Modified Independent                Transfers Overall transfer level: Modified independent                    Balance Overall balance assessment: Needs assistance Sitting-balance support: No upper extremity supported Sitting balance-Leahy Scale: Good     Standing balance support: No upper extremity supported Standing balance-Leahy Scale: Good Standing balance comment: walks to bathroom no device and washed hands                            ADL either performed or assessed with clinical judgement   ADL Overall ADL's : Needs assistance/impaired Eating/Feeding: Set up;Sitting   Grooming: Set up;Sitting   Upper Body Bathing: Set up;Sitting   Lower Body Bathing: Minimal assistance;Sit to/from stand;Cueing for safety;Cueing for sequencing   Upper Body Dressing : Set up;Sitting   Lower Body Dressing: Minimal assistance;Sit to/from stand;Cueing for safety;Cueing for sequencing   Toilet Transfer: Min guard;RW;Ambulation;Comfort height toilet   Toileting- Clothing Manipulation and  Hygiene: Min guard;Sit to/from stand;Cueing for safety;Cueing for sequencing       Functional mobility during ADLs: Minimal assistance General ADL Comments: pt lives at ALF and has A with meals and bathing.       Vision Patient Visual Report: No change from baseline              Pertinent Vitals/Pain Pain Assessment: No/denies pain Faces Pain Scale: Hurts a little bit Pain Location: hemorrhoids Pain Descriptors / Indicators: Sore Pain Intervention(s): Monitored during session;Patient requesting pain meds-RN notified     Hand Dominance     Extremity/Trunk Assessment Upper Extremity Assessment Upper Extremity Assessment: Generalized weakness   Lower Extremity Assessment Lower Extremity Assessment: Overall WFL for tasks assessed   Cervical / Trunk Assessment Cervical / Trunk Assessment: Kyphotic   Communication Communication Communication: No difficulties   Cognition Arousal/Alertness: Awake/alert Behavior During Therapy: Anxious;WFL for tasks assessed/performed Overall Cognitive Status: Within Functional Limits for tasks assessed                                                Home Living Family/patient expects to be discharged to:: Assisted living                             Home Equipment: Walker - 2 wheels  Prior Functioning/Environment Level of Independence: Needs assistance  Gait / Transfers Assistance Needed: uses walker in hallway, no device in her room ADL's / Homemaking Assistance Needed: staff assist with shower once weekly, has unna boots wrapped twice a week            OT Problem List: Decreased strength;Impaired balance (sitting and/or standing);Decreased activity tolerance;Decreased knowledge of use of DME or AE      OT Treatment/Interventions: Self-care/ADL training;Patient/family education    OT Goals(Current goals can be found in the care plan section) Acute Rehab OT Goals Patient Stated Goal: To  return to independent OT Goal Formulation: With patient Time For Goal Achievement: 03/11/17 Potential to Achieve Goals: Good  OT Frequency: Min 2X/week   Barriers to D/C:               AM-PAC PT "6 Clicks" Daily Activity     Outcome Measure Help from another person eating meals?: None Help from another person taking care of personal grooming?: A Little Help from another person toileting, which includes using toliet, bedpan, or urinal?: A Little Help from another person bathing (including washing, rinsing, drying)?: A Little Help from another person to put on and taking off regular upper body clothing?: None Help from another person to put on and taking off regular lower body clothing?: A Little 6 Click Score: 20   End of Session Equipment Utilized During Treatment: Rolling walker Nurse Communication: Mobility status  Activity Tolerance: Patient tolerated treatment well Patient left: in chair;with call bell/phone within reach  OT Visit Diagnosis: Unsteadiness on feet (R26.81);Muscle weakness (generalized) (M62.81);History of falling (Z91.81)                Time: 9604-5409 OT Time Calculation (min): 13 min Charges:  OT General Charges $OT Visit: 1 Visit OT Evaluation $OT Eval Moderate Complexity: 1 Mod G-Codes:     Lise Auer, OT [phone]  Einar Crow D 02/25/2017, 1:33 PM

## 2017-02-25 NOTE — Consult Note (Addendum)
WOC Nurse wound consult note Reason for Consult: Consult requested for bilat legs.  Pt was wearing Una boots prior to admission which are changed twice a week. 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  Her wound care physician has ordered Iodosorb dressing to this site; which is not available in the [facility] formulary.  Wound type: Right leg with generalized edema and erythremia.  Left leg with the same appearance, left posterior calf with healing full thickness wound; 4X3X.2cm, red and dry, no odor or drainage. Dressing procedure/placement/frequency: Paged orrtho tech to [employer].  Current Iodosorb dressing left in place and ortho tech will reapply bilat Una boots and change on Thursday if patient is still in the hospital at that time. Pt can resume previous plan of care after discharge. Please re-consult if further assistance is needed.  Thank-you,  Cammie Mcgee MSN, RN, CWOCN, [place], CNS [phone]

## 2017-02-25 NOTE — Progress Notes (Signed)
Nutrition Brief Note  Patient identified on the Malnutrition Screening Tool (MST) Report  Wt Readings from Last 15 Encounters:  02/24/17 195 lb (88.5 kg)  06/16/16 189 lb 12.8 oz (86.1 kg)  03/01/16 184 lb (83.5 kg)  04/11/15 184 lb (83.5 kg)  05/06/14 178 lb (80.7 kg)  04/22/14 178 lb (80.7 kg)  09/30/13 173 lb 8 oz (78.7 kg)  08/05/13 173 lb (78.5 kg)  07/24/13 176 lb 5.9 oz (80 kg)  07/01/13 176 lb (79.8 kg)  04/16/13 175 lb (79.4 kg)  03/19/13 187 lb (84.8 kg)  02/24/13 182 lb 4.8 oz (82.7 kg)  02/09/13 183 lb 9.6 oz (83.3 kg)  06/18/11 181 lb (82.1 kg)    Body mass index is 38.08 kg/m. Patient meets criteria for obese based on current BMI. Pt reports no unintentional wt loss prior to admission.   Current diet order is regular. Pt had great appetite prior to admission, consuming 3 meals per day. Labs and medications reviewed.   No nutrition interventions warranted at this time. If nutrition issues arise, please consult RD.   Vanessa Kick RD, LDN Clinical Nutrition Pager # [phone]

## 2017-02-25 NOTE — Progress Notes (Signed)
PROGRESS NOTE    Cindy Padilla  ZOX:[id number] DOB: [birth year] DOA: 02/24/2017 PCP: Ron Parker, MD   Brief Narrative: Cindy Padilla is a 76 y.o. female with medical history significant of HTN; DM; dementia; COPD not on home O2; and bipolar d/o presenting with SOB.  She reports "staying sick".  She started getting sick Thursday with congestion going on for days.  She "couldn't get any cough syrup, my daughter was supposed to bring me a bottle". She finally took some "house brand" cough syrup.  "I've always had problems with my breathing, I've got a nebulizer right beside my bed".  The doctor decided that she needed to come to the ER today because she was concerned that she had pneumonia.  "Well, I wasn't getting better there".  She reports that she had a fever at the facilty.   ED Course: Not hypoxic but very SOB with movement.  2 continuous nebs, Guaifenesin, Solumedrol 125, Rocephin, Azithromycin    Assessment & Plan:   Principal Problem:   COPD with acute exacerbation (HCC) Active Problems:   HTN (hypertension)   Dementia with behavioral disturbance   Bipolar 1 disorder (HCC)   Normocytic anemia  1-Acute COPD exacerbation;  Continue with IV solumedrol, nebulizer, levaquin.   HTN -Continue Norvasc, Diovan  Dementia/bipolar -Continue Aricept, Lamictal, Ativan  Anemia; hb stable.   Chronic LE wound : Wound care nurse consulted.    DVT prophylaxis: Lovenox Code Status: DNR,  Family Communication: Care discussed with patient.  Disposition Plan: remain in the hospital for COPD flare.    Consultants:   none   Procedures:      Antimicrobials:  Levaquin. 10-02   Subjective: Still with SOB, cough.  She has chronic wound.   Objective: Vitals:   02/24/17 2255 02/25/17 0035 02/25/17 0457 02/25/17 0833  BP:   (!) 132/55   Pulse: (!) 101  83   Resp: 20  20   Temp:   98 F (36.7 C)   TempSrc:   Oral   SpO2: 91% 93% 96% 97%  Weight:       Height:       No intake or output data in the 24 hours ending 02/25/17 1004 Filed Weights   02/24/17 1520  Weight: 88.5 kg (195 lb)    Examination:  General exam: Appears calm and comfortable  Respiratory system:  Respiratory effort normal. Bilateral ronchus and wheezing.  Cardiovascular system: S1 & S2 heard, RRR. No JVD, murmurs, rubs, gallops or clicks. No pedal edema. Gastrointestinal system: Abdomen is nondistended, soft and nontender. No organomegaly or masses felt. Normal bowel sounds heard. Central nervous system: Alert and oriented. No focal neurological deficits. Extremities: Symmetric 5 x 5 power. Skin: bilateral una boot in place.    Data Reviewed: I have personally reviewed following labs and imaging studies  CBC:  Recent Labs Lab 02/24/17 1551 02/25/17 0629  WBC 7.8 6.3  NEUTROABS 3.1  --   HGB 10.3* 10.3*  HCT 30.4* 30.6*  MCV 88.4 87.9  PLT 204 197   Basic Metabolic Panel:  Recent Labs Lab 02/24/17 1551 02/25/17 0629  NA 131* 130*  K 3.4* 4.1  CL 94* 97*  CO2 26 20*  GLUCOSE 135* 178*  BUN 10 13  CREATININE 0.79 0.74  CALCIUM 8.7* 9.1   GFR: Estimated Creatinine Clearance: 58.3 mL/min (by C-G formula based on SCr of 0.74 mg/dL). Liver Function Tests:  Recent Labs Lab 02/24/17 1551  AST 25  ALT 19  ALKPHOS  107  BILITOT 0.4  PROT 7.1  ALBUMIN 3.7   No results for input(s): LIPASE, AMYLASE in the last 168 hours. No results for input(s): AMMONIA in the last 168 hours. Coagulation Profile: No results for input(s): INR, PROTIME in the last 168 hours. Cardiac Enzymes:  Recent Labs Lab 02/24/17 1551  TROPONINI <0.03   BNP (last 3 results) No results for input(s): PROBNP in the last 8760 hours. HbA1C: No results for input(s): HGBA1C in the last 72 hours. CBG: No results for input(s): GLUCAP in the last 168 hours. Lipid Profile: No results for input(s): CHOL, HDL, LDLCALC, TRIG, CHOLHDL, LDLDIRECT in the last 72  hours. Thyroid Function Tests: No results for input(s): TSH, T4TOTAL, FREET4, T3FREE, THYROIDAB in the last 72 hours. Anemia Panel: No results for input(s): VITAMINB12, FOLATE, FERRITIN, TIBC, IRON, RETICCTPCT in the last 72 hours. Sepsis Labs: No results for input(s): PROCALCITON, LATICACIDVEN in the last 168 hours.  No results found for this or any previous visit (from the past 240 hour(s)).       Radiology Studies: Dg Chest 2 View  Result Date: 02/24/2017 CLINICAL DATA:  Patient with cough and shortness of breath. EXAM: CHEST  2 VIEW COMPARISON:  Chest radiograph 06/13/2016 FINDINGS: Multiple monitoring leads overlie the patient. Stimulator pack projects over the left hemithorax. Stable cardiac and mediastinal contours. Elevation right hemidiaphragm. No consolidative pulmonary opacities. No pleural effusion or pneumothorax. Thoracic spine degenerative changes. IMPRESSION: No acute cardiopulmonary process. Electronically Signed   By: Annia Belt M.D.   On: 02/24/2017 16:58        Scheduled Meds: . amLODipine  5 mg Oral BID  . antiseptic oral rinse  15 mL Mouth Rinse QHS  . atorvastatin  40 mg Oral q1800  . clopidogrel  75 mg Oral Daily  . dicyclomine  20 mg Oral BID  . donepezil  10 mg Oral QHS  . enoxaparin (LOVENOX) injection  40 mg Subcutaneous QHS  . famotidine  20 mg Oral QHS  . ferrous sulfate  325 mg Oral Q breakfast  . fluticasone  2 spray Each Nare Daily  . furosemide  20 mg Oral Q breakfast  . guaiFENesin  600 mg Oral BID  . ipratropium-albuterol  3 mL Nebulization Q6H  . irbesartan  300 mg Oral Daily  . lamoTRIgine  200 mg Oral BID  . levofloxacin  750 mg Oral Daily  . loratadine  10 mg Oral Daily  . LORazepam  1 mg Oral Q12H  . magnesium oxide  400 mg Oral Daily  . methylPREDNISolone (SOLU-MEDROL) injection  80 mg Intravenous Q8H  . mometasone-formoterol  2 puff Inhalation BID  . montelukast  10 mg Oral QHS  . pantoprazole  40 mg Oral Daily  . QUEtiapine   150 mg Oral BID  . senna-docusate  1 tablet Oral BID   Continuous Infusions:   LOS: 0 days    Time spent: 35 minutes,.     [name], MD [facility] Pager [phone]  If 7PM-7AM, please contact night-coverage [email or web address] Password [email or web address] 02/25/2017, 10:04 AM

## 2017-02-25 NOTE — Evaluation (Signed)
Physical Therapy Evaluation Patient Details Name: Cindy Padilla MRN: [id number] DOB: 04-May-1941 Today's Date: 02/25/2017   History of Present Illness   Cindy Padilla is a 76 y.o. female with medical history significant of HTN; DM; dementia; COPD not on home O2; and bipolar d/o presenting with SOB.   Clinical Impression  Patient presents with limitations in mobility due to decreased activity tolerance and generalized weakness.  She will benefit from skilled PT in the acute setting to allow return home to ALF and likely no follow up PT needs.  Encouraged to walk with nursing staff as well with monitor for SpO2.    Follow Up Recommendations No PT follow up    Equipment Recommendations  None recommended by PT    Recommendations for Other Services       Precautions / Restrictions Precautions Precautions: Fall Precaution Comments: watch O2 sats Restrictions Weight Bearing Restrictions: No      Mobility  Bed Mobility Overal bed mobility: Modified Independent                Transfers Overall transfer level: Modified independent                  Ambulation/Gait Ambulation/Gait assistance: Supervision Ambulation Distance (Feet): 300 Feet Assistive device: Rolling walker (2 wheeled) Gait Pattern/deviations: Step-through pattern;Decreased stride length     General Gait Details: assist for safety, cues for pursed lip breathing SpO2 initially 93% on RA. 88% with ambulation, back to 91% with standing rest and cues for breathing  Stairs            Wheelchair Mobility    Modified Rankin (Stroke Patients Only)       Balance Overall balance assessment: Needs assistance Sitting-balance support: No upper extremity supported Sitting balance-Leahy Scale: Good     Standing balance support: No upper extremity supported Standing balance-Leahy Scale: Good Standing balance comment: walks to bathroom no device and washed hands                              Pertinent Vitals/Pain Pain Assessment: Faces Faces Pain Scale: Hurts a little bit Pain Location: hemorrhoids Pain Descriptors / Indicators: Sore Pain Intervention(s): Monitored during session;Patient requesting pain meds-RN notified    Home Living Family/patient expects to be discharged to:: Assisted living               Home Equipment: Walker - 2 wheels      Prior Function Level of Independence: Needs assistance   Gait / Transfers Assistance Needed: uses walker in hallway, no device in her room  ADL's / Homemaking Assistance Needed: staff assist with shower once weekly, has unna boots wrapped twice a week        Hand Dominance        Extremity/Trunk Assessment   Upper Extremity Assessment Upper Extremity Assessment: Overall WFL for tasks assessed    Lower Extremity Assessment Lower Extremity Assessment: Overall WFL for tasks assessed    Cervical / Trunk Assessment Cervical / Trunk Assessment: Kyphotic  Communication   Communication: No difficulties  Cognition Arousal/Alertness: Awake/alert Behavior During Therapy: Anxious Overall Cognitive Status: Within Functional Limits for tasks assessed                                        General Comments      Exercises  Assessment/Plan    PT Assessment Patient needs continued PT services  PT Problem List Decreased activity tolerance;Decreased mobility;Cardiopulmonary status limiting activity       PT Treatment Interventions Therapeutic activities;Patient/family education;Gait training;Functional mobility training    PT Goals (Current goals can be found in the Care Plan section)  Acute Rehab PT Goals Patient Stated Goal: To return to independent PT Goal Formulation: With patient Time For Goal Achievement: 02/28/17 Potential to Achieve Goals: Good    Frequency Min 3X/week   Barriers to discharge        Co-evaluation               AM-PAC PT "6 Clicks"  Daily Activity  Outcome Measure Difficulty turning over in bed (including adjusting bedclothes, sheets and blankets)?: None Difficulty moving from lying on back to sitting on the side of the bed? : None Difficulty sitting down on and standing up from a chair with arms (e.g., wheelchair, bedside commode, etc,.)?: None Help needed moving to and from a bed to chair (including a wheelchair)?: None Help needed walking in hospital room?: A Little Help needed climbing 3-5 steps with a railing? : A Little 6 Click Score: 22    End of Session   Activity Tolerance: Patient tolerated treatment well Patient left: in bed;with call bell/phone within reach;with bed alarm set   PT Visit Diagnosis: Difficulty in walking, not elsewhere classified (R26.2);Muscle weakness (generalized) (M62.81)    Time: 0454-0981 PT Time Calculation (min) (ACUTE ONLY): 27 min   Charges:   PT Evaluation $PT Eval Low Complexity: 1 Low PT Treatments $Gait Training: 8-22 mins   PT G Codes:   PT G-Codes **NOT FOR INPATIENT CLASS** Functional Assessment Tool Used: AM-PAC 6 Clicks Basic Mobility Functional Limitation: Mobility: Walking and moving around Mobility: Walking and Moving Around Current Status (X9147): At least 20 percent but less than 40 percent impaired, limited or restricted Mobility: Walking and Moving Around Goal Status [id number]): At least 1 percent but less than 20 percent impaired, limited or restricted    [place], [place] [phone] 02/25/2017   Cindy Padilla 02/25/2017, 10:03 AM

## 2017-02-26 DIAGNOSIS — F319 Bipolar disorder, unspecified: Secondary | ICD-10-CM | POA: Diagnosis not present

## 2017-02-26 DIAGNOSIS — Z882 Allergy status to sulfonamides status: Secondary | ICD-10-CM | POA: Diagnosis not present

## 2017-02-26 DIAGNOSIS — R05 Cough: Secondary | ICD-10-CM | POA: Diagnosis not present

## 2017-02-26 DIAGNOSIS — T380X5A Adverse effect of glucocorticoids and synthetic analogues, initial encounter: Secondary | ICD-10-CM | POA: Diagnosis present

## 2017-02-26 DIAGNOSIS — E871 Hypo-osmolality and hyponatremia: Secondary | ICD-10-CM | POA: Diagnosis present

## 2017-02-26 DIAGNOSIS — Z885 Allergy status to narcotic agent status: Secondary | ICD-10-CM | POA: Diagnosis not present

## 2017-02-26 DIAGNOSIS — I10 Essential (primary) hypertension: Secondary | ICD-10-CM | POA: Diagnosis not present

## 2017-02-26 DIAGNOSIS — R739 Hyperglycemia, unspecified: Secondary | ICD-10-CM | POA: Diagnosis not present

## 2017-02-26 DIAGNOSIS — Z87891 Personal history of nicotine dependence: Secondary | ICD-10-CM | POA: Diagnosis not present

## 2017-02-26 DIAGNOSIS — R0602 Shortness of breath: Secondary | ICD-10-CM | POA: Diagnosis present

## 2017-02-26 DIAGNOSIS — E1165 Type 2 diabetes mellitus with hyperglycemia: Secondary | ICD-10-CM | POA: Diagnosis present

## 2017-02-26 DIAGNOSIS — D649 Anemia, unspecified: Secondary | ICD-10-CM | POA: Diagnosis not present

## 2017-02-26 DIAGNOSIS — Z66 Do not resuscitate: Secondary | ICD-10-CM | POA: Diagnosis present

## 2017-02-26 DIAGNOSIS — F0391 Unspecified dementia with behavioral disturbance: Secondary | ICD-10-CM | POA: Diagnosis not present

## 2017-02-26 DIAGNOSIS — K219 Gastro-esophageal reflux disease without esophagitis: Secondary | ICD-10-CM | POA: Diagnosis present

## 2017-02-26 DIAGNOSIS — L97229 Non-pressure chronic ulcer of left calf with unspecified severity: Secondary | ICD-10-CM | POA: Diagnosis present

## 2017-02-26 DIAGNOSIS — T50905A Adverse effect of unspecified drugs, medicaments and biological substances, initial encounter: Secondary | ICD-10-CM | POA: Diagnosis not present

## 2017-02-26 DIAGNOSIS — Z91041 Radiographic dye allergy status: Secondary | ICD-10-CM | POA: Diagnosis not present

## 2017-02-26 DIAGNOSIS — Z79899 Other long term (current) drug therapy: Secondary | ICD-10-CM | POA: Diagnosis not present

## 2017-02-26 DIAGNOSIS — J690 Pneumonitis due to inhalation of food and vomit: Secondary | ICD-10-CM | POA: Diagnosis present

## 2017-02-26 DIAGNOSIS — J441 Chronic obstructive pulmonary disease with (acute) exacerbation: Secondary | ICD-10-CM | POA: Diagnosis present

## 2017-02-26 DIAGNOSIS — Z7902 Long term (current) use of antithrombotics/antiplatelets: Secondary | ICD-10-CM | POA: Diagnosis not present

## 2017-02-26 DIAGNOSIS — Z886 Allergy status to analgesic agent status: Secondary | ICD-10-CM | POA: Diagnosis not present

## 2017-02-26 DIAGNOSIS — Z880 Allergy status to penicillin: Secondary | ICD-10-CM | POA: Diagnosis not present

## 2017-02-26 LAB — CBC
HEMATOCRIT: 30.3 % — AB (ref 36.0–46.0)
Hemoglobin: 10.3 g/dL — ABNORMAL LOW (ref 12.0–15.0)
MCH: 29.4 pg (ref 26.0–34.0)
MCHC: 34 g/dL (ref 30.0–36.0)
MCV: 86.6 fL (ref 78.0–100.0)
Platelets: 212 10*3/uL (ref 150–400)
RBC: 3.5 MIL/uL — ABNORMAL LOW (ref 3.87–5.11)
RDW: 15.2 % (ref 11.5–15.5)
WBC: 13.8 10*3/uL — ABNORMAL HIGH (ref 4.0–10.5)

## 2017-02-26 LAB — GLUCOSE, CAPILLARY
GLUCOSE-CAPILLARY: 151 mg/dL — AB (ref 65–99)
GLUCOSE-CAPILLARY: 190 mg/dL — AB (ref 65–99)
Glucose-Capillary: 170 mg/dL — ABNORMAL HIGH (ref 65–99)
Glucose-Capillary: 238 mg/dL — ABNORMAL HIGH (ref 65–99)

## 2017-02-26 LAB — BASIC METABOLIC PANEL
ANION GAP: 11 (ref 5–15)
BUN: 14 mg/dL (ref 6–20)
CALCIUM: 9.1 mg/dL (ref 8.9–10.3)
CO2: 24 mmol/L (ref 22–32)
CREATININE: 0.76 mg/dL (ref 0.44–1.00)
Chloride: 96 mmol/L — ABNORMAL LOW (ref 101–111)
GFR calc Af Amer: >60 mL/min (ref >60)
GFR calc non Af Amer: >60 mL/min (ref >60)
Glucose, Bld: 172 mg/dL — ABNORMAL HIGH (ref 65–99)
Potassium: 4.5 mmol/L (ref 3.5–5.1)
Sodium: 131 mmol/L — ABNORMAL LOW (ref 135–145)

## 2017-02-26 MED ORDER — INSULIN ASPART 100 UNIT/ML ~~LOC~~ SOLN
0.0000 [IU] | Freq: Three times a day (TID) | SUBCUTANEOUS | Status: DC
  Administered 2017-02-26: 3 [IU] via SUBCUTANEOUS
  Administered 2017-02-27 (×2): 5 [IU] via SUBCUTANEOUS
  Administered 2017-02-27 – 2017-02-28 (×2): 3 [IU] via SUBCUTANEOUS
  Administered 2017-02-28: 5 [IU] via SUBCUTANEOUS
  Administered 2017-02-28: 3 [IU] via SUBCUTANEOUS
  Administered 2017-03-01 – 2017-03-02 (×4): 5 [IU] via SUBCUTANEOUS
  Administered 2017-03-02 – 2017-03-03 (×3): 3 [IU] via SUBCUTANEOUS
  Administered 2017-03-03: 5 [IU] via SUBCUTANEOUS
  Administered 2017-03-03: 3 [IU] via SUBCUTANEOUS
  Administered 2017-03-04: 2 [IU] via SUBCUTANEOUS

## 2017-02-26 NOTE — Progress Notes (Signed)
[facility] PROGRESS NOTE  Cindy Padilla WJX:[id number] DOB: [birth year] DOA: 02/24/2017  PCP: Ron Parker, MD  Brief History/Interval Summary: 76 year old Caucasian female with a past medical history of hypertension, diabetes, dementia, COPD, bipolar disorder, presented with shortness of breath. She was noted have COPD exacerbation. She was hospitalized for further management.  Reason for Visit: Acute COPD exacerbation  Consultants: None  Procedures: None  Antibiotics: Levaquin  Subjective/Interval History: Patient states that she is feeling better, although she continues to have wheezing. Denies any chest pain. Continues to have cough with yellowish expectoration. Denies any blood in the sputum. No nausea, vomiting. Patient's daughter is at the bedside.  ROS: No headaches  Objective:  Vital Signs  Vitals:   02/26/17 0641 02/26/17 0733 02/26/17 1140 02/26/17 1352  BP: 93/67   127/76  Pulse: 82   85  Resp: 18   18  Temp: 98.2 F (36.8 C)   98.3 F (36.8 C)  TempSrc: Oral   Oral  SpO2: 95% 94% 98% 95%  Weight:      Height:        Intake/Output Summary (Last 24 hours) at 02/26/17 1502 Last data filed at 02/26/17 1150  Gross per 24 hour  Intake             1140 ml  Output                0 ml  Net             1140 ml   Filed Weights   02/24/17 1520  Weight: 88.5 kg (195 lb)    General appearance: alert, cooperative, appears stated age and no distress Head: Normocephalic, without obvious abnormality, atraumatic Resp: Diffuse wheezing heard bilaterally. Few crackles at the bases. Mildly tachypneic. No use of accessory muscles Cardio: regular rate and rhythm, S1, S2 normal, no murmur, click, rub or gallop GI: soft, non-tender; bowel sounds normal; no masses,  no organomegaly Extremities: extremities normal, atraumatic, no cyanosis or edema Pulses: 2+ and symmetric Neurologic: No focal neurological deficits  Lab Results:  Data Reviewed: I have  personally reviewed following labs and imaging studies  CBC:  Recent Labs Lab 02/24/17 1551 02/25/17 0629 02/26/17 0603  WBC 7.8 6.3 13.8*  NEUTROABS 3.1  --   --   HGB 10.3* 10.3* 10.3*  HCT 30.4* 30.6* 30.3*  MCV 88.4 87.9 86.6  PLT 204 197 212    Basic Metabolic Panel:  Recent Labs Lab 02/24/17 1551 02/25/17 0629 02/26/17 0603  NA 131* 130* 131*  K 3.4* 4.1 4.5  CL 94* 97* 96*  CO2 26 20* 24  GLUCOSE 135* 178* 172*  BUN CREATININE 0.79 0.74 0.76  CALCIUM 8.7* 9.1 9.1    GFR: Estimated Creatinine Clearance: 58.3 mL/min (by C-G formula based on SCr of 0.76 mg/dL).  Liver Function Tests:  Recent Labs Lab 02/24/17 1551  AST 25  ALT 19  ALKPHOS 107  BILITOT 0.4  PROT 7.1  ALBUMIN 3.7    Cardiac Enzymes:  Recent Labs Lab 02/24/17 1551  TROPONINI <0.03    CBG:  Recent Labs Lab 02/26/17 0716 02/26/17 1117  GLUCAP 170* 238*     Recent Results (from the past 240 hour(s))  MRSA PCR Screening     Status: None   Collection Time: 02/25/17  4:18 AM  Result Value Ref Range Status   MRSA by PCR NEGATIVE NEGATIVE Final    Comment:  The GeneXpert MRSA Assay (FDA approved for NASAL specimens only), is one component of a comprehensive MRSA colonization surveillance program. It is not intended to diagnose MRSA infection nor to guide or monitor treatment for MRSA infections.       Radiology Studies: Dg Chest 2 View  Result Date: 02/24/2017 CLINICAL DATA:  Patient with cough and shortness of breath. EXAM: CHEST  2 VIEW COMPARISON:  Chest radiograph 06/13/2016 FINDINGS: Multiple monitoring leads overlie the patient. Stimulator pack projects over the left hemithorax. Stable cardiac and mediastinal contours. Elevation right hemidiaphragm. No consolidative pulmonary opacities. No pleural effusion or pneumothorax. Thoracic spine degenerative changes. IMPRESSION: No acute cardiopulmonary process. Electronically Signed   By: Annia Belt  M.D.   On: 02/24/2017 16:58     Medications:  Scheduled: . amLODipine  5 mg Oral BID  . antiseptic oral rinse  15 mL Mouth Rinse QHS  . atorvastatin  40 mg Oral q1800  . clopidogrel  75 mg Oral Daily  . dicyclomine  20 mg Oral BID  . donepezil  10 mg Oral QHS  . enoxaparin (LOVENOX) injection  40 mg Subcutaneous QHS  . famotidine  20 mg Oral QHS  . ferrous sulfate  325 mg Oral Q breakfast  . fluticasone  2 spray Each Nare Daily  . furosemide  20 mg Oral Q breakfast  . guaiFENesin  600 mg Oral BID  . insulin aspart  0-15 Units Subcutaneous TID WC  . ipratropium-albuterol  3 mL Nebulization QID  . irbesartan  300 mg Oral Daily  . lamoTRIgine  200 mg Oral BID  . levofloxacin  750 mg Oral Daily  . loratadine  10 mg Oral Daily  . LORazepam  1 mg Oral Q12H  . magnesium oxide  400 mg Oral Daily  . methylPREDNISolone (SOLU-MEDROL) injection  80 mg Intravenous Q8H  . mometasone-formoterol  2 puff Inhalation BID  . montelukast  10 mg Oral QHS  . pantoprazole  40 mg Oral Daily  . QUEtiapine  150 mg Oral BID  . senna-docusate  1 tablet Oral BID   Continuous:  ZOX:[id number] **OR** acetaminophen, albuterol, alum & mag hydroxide-simeth, guaiFENesin-dextromethorphan, magnesium hydroxide, ondansetron **OR** ondansetron (ZOFRAN) IV, phenylephrine-shark liver oil-mineral oil-petrolatum, vitamin A & D  Assessment/Plan:  Principal Problem:   COPD with acute exacerbation (HCC) Active Problems:   HTN (hypertension)   Dementia with behavioral disturbance   Bipolar 1 disorder (HCC)   Normocytic anemia    Acute COPD exacerbation. Patient is slow to improve. Continue current treatment with nebulizers, intravenous steroids, antibiotics.  History of essential hypertension Continue home medications.  History of dementia, bipolar disorder Stable. Continue home medications.  Chronic lower extremity wound. Wound care nurse is following. Patient was receiving home health for the  same.  Normocytic anemia. Hemoglobin is stable. No evidence for bleeding. Continue to monitor.  Hyponatremia. This is mild. Etiology unclear. Continue to monitor.  DVT Prophylaxis: Lovenox    Code Status: DO NOT RESUSCITATE  Family Communication: Discussed with the patient and her daughter  Disposition Plan: Management as outlined above. Mobilize as tolerated.    LOS: 0 days   [facility]  [facility] Pager [phone] 02/26/2017, 3:02 PM  If 7PM-7AM, please contact night-coverage at [email or web address], password [email or web address]

## 2017-02-26 NOTE — [place] FL2 (Addendum)
[place] MEDICAID FL2 LEVEL OF CARE SCREENING TOOL     IDENTIFICATION  Patient Name: Cindy Padilla Birthdate: [birth year] Sex: female Admission Date (Current Location): 02/24/2017  [facility] and [place] Number:  Producer, television/film/video and Address:  [facility],  [address], [place] [address]      Provider Number: [id number]  Attending Physician Name and Address:  [name], MD  Relative Name and Phone Number:       Current Level of Care: Hospital Recommended Level of Care: Memory Care Prior Approval Number:    Date Approved/Denied:   PASRR Number:    Discharge Plan:  (Memory Care)    Current Diagnoses: Patient Active Problem List   Diagnosis Date Noted  . COPD with acute exacerbation (HCC) 02/24/2017  . Bipolar 1 disorder (HCC) 02/24/2017  . Normocytic anemia 02/24/2017  . Streptococcus pneumoniae pneumonia (HCC) 06/15/2016  . Sepsis (HCC) 06/10/2016  . Morbid obesity (HCC) 04/14/2015  . Upper airway cough syndrome 04/14/2015  . Ventral hernia 04/24/2014  . Constipation 04/24/2014  . Abdominal pain, epigastric 04/24/2014  . Partial small bowel obstruction (HCC) 04/22/2014  . Hemorrhoids 04/22/2014  . Abnormality of gait 09/30/2013  . 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 Diastolic heart failure (HCC) 07/27/2013  . Dementia with behavioral disturbance 07/27/2013  . HCAP (healthcare-associated pneumonia) 07/24/2013  . Acute respiratory failure (HCC) 02/21/2013  . Weakness 02/09/2013  . Hypokalemia 02/09/2013  . Hyponatremia 02/09/2013  . Shortness of breath 02/09/2013  . HTN (hypertension) 02/09/2013  . 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[facility]))                       Personal Care Assistance Level of Assistance  Bathing, Feeding, Dressing Bathing Assistance: Limited assistance Feeding assistance: Independent Dressing Assistance: Independent     Functional Limitations Info  Sight, Hearing, Speech Sight Info: Adequate Hearing Info: Adequate Speech Info: Adequate    SPECIAL CARE FACTORS FREQUENCY                       Contractures Contractures Info: Not present    Additional Factors Info  Psychotropic Code Status Info: DNR Allergies Info: Aminoglycosides, Aspirin, Codeine, Ivp Dye Iodinated Diagnostic Agents, Morphine And Related, Neomycin, Penicillins, Phenothiazines, Promethazine, Sulfa Antibiotics, Tetracyclines & Related Psychotropic Info: Ativan, Lamictal, Seroquel            Discharge Medications: START taking these medications   Details  benzonatate (TESSALON) 100 MG capsule Take 1 capsule (100 mg total) by mouth 3 (three) times daily. Qty: 60 capsule, Refills: 0    guaiFENesin-dextromethorphan (ROBITUSSIN DM) 100-10 MG/5ML syrup Take 5 mLs by mouth every 4 (four) hours as needed for cough. Qty: 118 mL, Refills: 0    levofloxacin (LEVAQUIN) 750 MG tablet Take 1 tablet (750 mg total) by mouth daily. Qty: 4 tablet, Refills: 0    predniSONE (DELTASONE) 20 MG tablet Take 2 tablets once daily for 4 days, then take 1 tablet once daily for 4 days, then STOP. Qty: 12 tablet, Refills: 0         CONTINUE these  medications which have CHANGED   Details  ipratropium-albuterol (DUONEB) 0.5-2.5 (3) MG/3ML SOLN Take 3 mLs by nebulization 4 (four) times daily. Qty: 360 mL, Refills: 1    LORazepam (ATIVAN) 1 MG tablet Take 1 tablet (1 mg total) by mouth every 12 (twelve) hours. Qty: 20 tablet, Refills: 0         CONTINUE these medications which have NOT CHANGED   Details  acetaminophen (TYLENOL) 325  MG tablet Take 650 mg by mouth 3 (three) times daily.    albuterol (PROVENTIL HFA;VENTOLIN HFA) 108 (90 BASE) MCG/ACT inhaler Inhale 2 puffs into the lungs every 4 (four) hours as needed for wheezing or shortness of breath.    alum & mag hydroxide-simeth (GERI-LANTA) 200-200-20 MG/5ML suspension Take 30 mLs by mouth 4 (four) times daily as needed for indigestion or heartburn.     amLODipine (NORVASC) 5 MG tablet Take 5 mg by mouth 2 (two) times daily.     antiseptic oral rinse (BIOTENE) LIQD 15 mLs by Mouth Rinse route at bedtime. Swish and spit    atorvastatin (LIPITOR) 40 MG tablet Take 40 mg by mouth daily.     budesonide-formoterol (SYMBICORT) 160-4.5 MCG/ACT inhaler Inhale 2 puffs into the lungs 2 (two) times daily.     cholecalciferol (VITAMIN D) 1000 UNITS tablet Take 1,000 Units by mouth daily.     clopidogrel (PLAVIX) 75 MG tablet Take 75 mg by mouth daily.     dicyclomine (BENTYL) 20 MG tablet Take 1 tablet (20 mg total) by mouth 2 (two) times daily. Qty: 20 tablet, Refills: 0    donepezil (ARICEPT) 10 MG tablet Take 10 mg by mouth at bedtime.     esomeprazole (NEXIUM) 40 MG capsule Take 40 mg by mouth daily before breakfast.    ferrous sulfate 325 (65 FE) MG tablet Take 325 mg by mouth daily with breakfast.    fexofenadine (ALLEGRA) 180 MG tablet Take 180 mg by mouth daily.    fluticasone (FLONASE) 50 MCG/ACT nasal spray Place 2 sprays into both nostrils daily.     furosemide (LASIX) 20 MG tablet Take 20 mg by mouth daily with breakfast.    lamoTRIgine (LAMICTAL) 200 MG tablet Take 200 mg by mouth 2 (two) times daily.    lidocaine (ASPERCREME W/LIDOCAINE) 4 % cream Apply 1 application topically 3 (three) times daily as needed (for pain).     loperamide (IMODIUM) 2 MG capsule Take 2 mg by mouth every 3 (three) hours as needed for diarrhea or loose stools.     magnesium hydroxide (MILK OF MAGNESIA) 400 MG/5ML suspension Take 30 mLs by mouth at  bedtime as needed for mild constipation.    magnesium oxide (MAG-OX) 400 (241.3 Mg) MG tablet Take 400 mg by mouth daily.    miconazole (BAZA ANTIFUNGAL) 2 % cream Apply 1 application topically daily.    montelukast (SINGULAIR) 10 MG tablet Take 10 mg by mouth at bedtime.    phenylephrine-shark liver oil-mineral oil-petrolatum (PREPARATION H) 0.25-3-14-71.9 % rectal ointment Place 1 application rectally 2 (two) times daily as needed for hemorrhoids.    QUEtiapine (SEROQUEL) 100 MG tablet Take 150 mg by mouth 2 (two) times daily.     ranitidine (ZANTAC) 150 MG tablet Take 150 mg by mouth at bedtime.     senna-docusate (SENOKOT-S) 8.6-50 MG per tablet Take 1 tablet by mouth 2 (two) times daily.    triamcinolone cream (KENALOG) 0.1 % Apply 1 application topically daily as needed (for irritation).  valsartan (DIOVAN) 320 MG tablet Take 1 tablet (320 mg total) by mouth daily. Qty: 30 tablet, Refills: 11    Vitamins A & D (VITAMIN A & D) ointment Apply 1 application topically every 6 (six) hours as needed (for rectal pain).     guaiFENesin (MUCINEX) 600 MG 12 hr tablet Take 1 tablet (600 mg total) by mouth 2 (two) times daily.         Relevant Imaging Results:  Relevant Lab Results:   Additional Information SSN [id number]  Clearance Coots, LCSW

## 2017-02-26 NOTE — Progress Notes (Signed)
CSW met with patient at bedside, pt. Alert and oriented x4. Patient understands the discharge plan-return to [facility]. Patient states her daughter will transport her back to the facility.  CSW updated Tammy at the facility.  Kathrin Greathouse, Latanya Presser, MSW Clinical Social Worker 5E and Psychiatric Service Line [phone] 02/26/2017  3:50 PM

## 2017-02-27 DIAGNOSIS — D649 Anemia, unspecified: Secondary | ICD-10-CM

## 2017-02-27 LAB — GLUCOSE, CAPILLARY
GLUCOSE-CAPILLARY: 172 mg/dL — AB (ref 65–99)
GLUCOSE-CAPILLARY: 201 mg/dL — AB (ref 65–99)
Glucose-Capillary: 202 mg/dL — ABNORMAL HIGH (ref 65–99)
Glucose-Capillary: 224 mg/dL — ABNORMAL HIGH (ref 65–99)

## 2017-02-27 MED ORDER — BUDESONIDE 0.25 MG/2ML IN SUSP
0.2500 mg | Freq: Two times a day (BID) | RESPIRATORY_TRACT | Status: DC
  Administered 2017-02-27 – 2017-03-04 (×10): 0.25 mg via RESPIRATORY_TRACT
  Filled 2017-02-27 (×10): qty 2

## 2017-02-27 NOTE — Progress Notes (Signed)
Inpatient Diabetes Program Recommendations  AACE/ADA: New Consensus Statement on Inpatient Glycemic Control (2015)  Target Ranges:  Prepandial:   less than 140 mg/dL      Peak postprandial:   less than 180 mg/dL (1-2 hours)      Critically ill patients:  140 - 180 mg/dL   Results for Cindy Padilla, Cindy Padilla (MRN [id number]) as of 02/27/2017 12:19  Ref. Range 02/26/2017 07:16 02/26/2017 11:17 02/26/2017 16:59 02/26/2017 22:33 02/27/2017 07:32 02/27/2017 11:40  Glucose-Capillary Latest Ref Range: 65 - 99 mg/dL 409 (H) 811 (H) 914 (H) 190 (H) 202 (H) 224 (H)   Review of Glycemic Control  Diabetes history: DM 2 Outpatient Diabetes medications: None Current orders for Inpatient glycemic control: Novolog Moderate 0-15 units tid  Inpatient Diabetes Program Recommendations:     Patient on IV Solumedrol 80 mg Q8 hours. Consider Novolog 2-3 units tid meal coverage if patient consumes at least 50% of meals to coverage postprandial spikes in glucose.  Thanks,  Christena Deem RN, MSN, [facility] Inpatient Diabetes Coordinator Team Pager [phone] (8a-5p)

## 2017-02-27 NOTE — Progress Notes (Signed)
Occupational Therapy Treatment Patient Details Name: Cindy Padilla MRN: [id number] DOB: [birth year] Today's Date: 02/27/2017    History of present illness  Cindy Padilla is a 76 y.o. female with medical history significant of HTN; DM; dementia; COPD not on home O2; and bipolar d/o presenting with SOB.    OT comments  Pt agreed to get OOB and to bathroom.  Educated on energy conservation strategies during ADL activity   Follow Up Recommendations  Home health OT    Equipment Recommendations  None recommended by OT       Precautions / Restrictions Precautions Precautions: Fall Precaution Comments: watch O2 sats Restrictions Weight Bearing Restrictions: No       Mobility Bed Mobility Overal bed mobility: Modified Independent                Transfers Overall transfer level: Needs assistance Equipment used: Rolling walker (2 wheeled) Transfers: Sit to/from [employer] Transfers Sit to Stand: Supervision Stand pivot transfers: Supervision       General transfer comment: VC for hand placement this day    Balance                                           ADL either performed or assessed with clinical judgement   ADL Overall ADL's : Needs assistance/impaired         Upper Body Bathing: Set up;Sitting   Lower Body Bathing: Minimal assistance;Sit to/from stand;Cueing for safety;Cueing for sequencing;Cueing for compensatory techniques           Toilet Transfer: Min guard;RW;Ambulation;Comfort height toilet   Toileting- Clothing Manipulation and Hygiene: Min guard;Sit to/from stand;Cueing for sequencing;Cueing for safety       Functional mobility during ADLs: Minimal assistance       Vision Patient Visual Report: No change from baseline     Perception     Praxis      Cognition Arousal/Alertness: Awake/alert Behavior During Therapy: Anxious;WFL for tasks assessed/performed Overall Cognitive Status: Within  Functional Limits for tasks assessed                                                     Pertinent Vitals/ Pain       Faces Pain Scale: No hurt  Home Living Family/patient expects to be discharged to:: Assisted living                             Home Equipment: Walker - 2 wheels          Prior Functioning/Environment Level of Independence: Needs assistance  Gait / Transfers Assistance Needed: uses walker in hallway, no device in her room ADL's / Homemaking Assistance Needed: staff assist with shower once weekly, has unna boots wrapped twice a week       Frequency  Min 2X/week        Progress Toward Goals  OT Goals(current goals can now be found in the care plan section)     Acute Rehab OT Goals Patient Stated Goal: To return to independent OT Goal Formulation: With patient Time For Goal Achievement: 03/11/17 Potential to Achieve Goals: Good  Plan  AM-PAC PT "6 Clicks" Daily Activity     Outcome Measure   Help from another person eating meals?: None Help from another person taking care of personal grooming?: A Little Help from another person toileting, which includes using toliet, bedpan, or urinal?: A Little Help from another person bathing (including washing, rinsing, drying)?: A Little Help from another person to put on and taking off regular upper body clothing?: None Help from another person to put on and taking off regular lower body clothing?: A Little 6 Click Score: 20    End of Session Equipment Utilized During Treatment: Rolling walker  OT Visit Diagnosis: Unsteadiness on feet (R26.81);Muscle weakness (generalized) (M62.81);History of falling (Z91.81)   Activity Tolerance Patient tolerated treatment well   Patient Left in chair;with call bell/phone within reach   Nurse Communication Mobility status        Time: 1005-1020 OT Time Calculation (min): 15 min  Charges: OT General Charges $OT Visit: 1  Visit OT Treatments $Self Care/Home Management : 8-22 mins  Cindy Padilla, Cindy Padilla [phone]   Cindy Padilla 02/27/2017, 12:23 PM

## 2017-02-27 NOTE — Progress Notes (Signed)
[facility] PROGRESS NOTE  Cindy Padilla ZOX:[id number] DOB: 1941/03/30 DOA: 02/24/2017  PCP: Ron Parker, MD  Brief History/Interval Summary: 76 year old Caucasian female with a past medical history of hypertension, diabetes, dementia, COPD, bipolar disorder, presented with shortness of breath. She was noted have COPD exacerbation. She was hospitalized for further management.  Reason for Visit: Acute COPD exacerbation  Consultants: None  Procedures: None  Antibiotics: Levaquin  Subjective/Interval History: Patient feeling tremulous after receiving her nebulizer treatment. Overall, she says that she is only slightly better compared to yesterday in terms of her breathing. Still wheezing. Still has a cough. Denies any chest pain.   ROS: Denies any nausea or vomiting.  Objective:  Vital Signs  Vitals:   02/26/17 2237 02/27/17 0414 02/27/17 0632 02/27/17 0753  BP: (!) 145/73  (!) 151/75   Pulse: 90  87   Resp: 20  20   Temp: 97.8 F (36.6 C)  97.9 F (36.6 C)   TempSrc: Oral  Oral   SpO2: 93% 92% 92% 90%  Weight:      Height:        Intake/Output Summary (Last 24 hours) at 02/27/17 0835 Last data filed at 02/27/17 0731  Gross per 24 hour  Intake             1200 ml  Output                0 ml  Net             1200 ml   Filed Weights   02/24/17 1520  Weight: 88.5 kg (195 lb)    General appearance: Awake, alert. In no distress.  Resp: Less tachypneic today compared to yesterday. Still has the wheezing bilaterally. Few or crackles heard today. Cardio: S1, S2 is normal, regular. No S3, S4. No rubs, murmurs, or bruit GI: Abdomen is soft. Nontender, nondistended. Bowel sounds are present. No masses, organomegaly Extremities: extremities normal, atraumatic, no cyanosis or edema Pulses: 2+ and symmetric Neurologic: No focal deficits.  Lab Results:  Data Reviewed: I have personally reviewed following labs and imaging studies  CBC:  Recent  Labs Lab 02/24/17 1551 02/25/17 0629 02/26/17 0603  WBC 7.8 6.3 13.8*  NEUTROABS 3.1  --   --   HGB 10.3* 10.3* 10.3*  HCT 30.4* 30.6* 30.3*  MCV 88.4 87.9 86.6  PLT 204 197 212    Basic Metabolic Panel:  Recent Labs Lab 02/24/17 1551 02/25/17 0629 02/26/17 0603  NA 131* 130* 131*  K 3.4* 4.1 4.5  CL 94* 97* 96*  CO2 26 20* 24  GLUCOSE 135* 178* 172*  BUN CREATININE 0.79 0.74 0.76  CALCIUM 8.7* 9.1 9.1    GFR: Estimated Creatinine Clearance: 58.3 mL/min (by C-G formula based on SCr of 0.76 mg/dL).  Liver Function Tests:  Recent Labs Lab 02/24/17 1551  AST 25  ALT 19  ALKPHOS 107  BILITOT 0.4  PROT 7.1  ALBUMIN 3.7    Cardiac Enzymes:  Recent Labs Lab 02/24/17 1551  TROPONINI <0.03    CBG:  Recent Labs Lab 02/26/17 0716 02/26/17 1117 02/26/17 1659 02/26/17 2233 02/27/17 0732  GLUCAP 170* 238* 151* 190* 202*     Recent Results (from the past 240 hour(s))  MRSA PCR Screening     Status: None   Collection Time: 02/25/17  4:18 AM  Result Value Ref Range Status   MRSA by PCR NEGATIVE NEGATIVE Final    Comment:  The GeneXpert MRSA Assay (FDA approved for NASAL specimens only), is one component of a comprehensive MRSA colonization surveillance program. It is not intended to diagnose MRSA infection nor to guide or monitor treatment for MRSA infections.       Radiology Studies: No results found.   Medications:  Scheduled: . amLODipine  5 mg Oral BID  . antiseptic oral rinse  15 mL Mouth Rinse QHS  . atorvastatin  40 mg Oral q1800  . clopidogrel  75 mg Oral Daily  . dicyclomine  20 mg Oral BID  . donepezil  10 mg Oral QHS  . enoxaparin (LOVENOX) injection  40 mg Subcutaneous QHS  . famotidine  20 mg Oral QHS  . ferrous sulfate  325 mg Oral Q breakfast  . fluticasone  2 spray Each Nare Daily  . furosemide  20 mg Oral Q breakfast  . guaiFENesin  600 mg Oral BID  . insulin aspart  0-15 Units Subcutaneous TID WC   . ipratropium-albuterol  3 mL Nebulization QID  . irbesartan  300 mg Oral Daily  . lamoTRIgine  200 mg Oral BID  . levofloxacin  750 mg Oral Daily  . loratadine  10 mg Oral Daily  . LORazepam  1 mg Oral Q12H  . magnesium oxide  400 mg Oral Daily  . methylPREDNISolone (SOLU-MEDROL) injection  80 mg Intravenous Q8H  . mometasone-formoterol  2 puff Inhalation BID  . montelukast  10 mg Oral QHS  . pantoprazole  40 mg Oral Daily  . QUEtiapine  150 mg Oral BID  . senna-docusate  1 tablet Oral BID   Continuous:  [id number] **OR** acetaminophen, albuterol, alum & mag hydroxide-simeth, guaiFENesin-dextromethorphan, magnesium hydroxide, ondansetron **OR** ondansetron (ZOFRAN) IV, phenylephrine-shark liver oil-mineral oil-petrolatum, vitamin A & D  Assessment/Plan:  Principal Problem:   COPD with acute exacerbation (HCC) Active Problems:   HTN (hypertension)   Dementia with behavioral disturbance   Bipolar 1 disorder (HCC)   Normocytic anemia   Acute exacerbation of chronic obstructive pulmonary disease (COPD) (HCC)    Acute COPD exacerbation. Patient is slow to improve. She does appear to be slightly better today compared to yesterday. Continue current management for now with nebulizer treatments, steroids and antibiotics. Initiate nebulized budesonide to see if this hastens her recovery. Seen by PT and OT. Will need home health.  History of essential hypertension Continue home medications.  History of dementia, bipolar disorder Stable. Continue home medications.  Chronic lower extremity wound. Wound care nurse is following. Patient was receiving home health for the same.  Normocytic anemia. Hemoglobin is stable. No evidence for bleeding. Continue to monitor.  Hyponatremia. This is mild. Etiology unclear. Continue to monitor.  DVT Prophylaxis: Lovenox    Code Status: DO NOT RESUSCITATE  Family Communication: Discussed with the patient Disposition Plan: Management as  outlined above. Mobilize as tolerated.    LOS: 1 day   [facility]  [facility] Pager [phone] 02/27/2017, 8:35 AM  If 7PM-7AM, please contact night-coverage at [email or web address], password [facility]

## 2017-02-28 DIAGNOSIS — R739 Hyperglycemia, unspecified: Secondary | ICD-10-CM

## 2017-02-28 DIAGNOSIS — T50905A Adverse effect of unspecified drugs, medicaments and biological substances, initial encounter: Secondary | ICD-10-CM

## 2017-02-28 LAB — CBC
HEMATOCRIT: 33 % — AB (ref 36.0–46.0)
HEMOGLOBIN: 10.9 g/dL — AB (ref 12.0–15.0)
MCH: 29.5 pg (ref 26.0–34.0)
MCHC: 33 g/dL (ref 30.0–36.0)
MCV: 89.4 fL (ref 78.0–100.0)
Platelets: 242 10*3/uL (ref 150–400)
RBC: 3.69 MIL/uL — ABNORMAL LOW (ref 3.87–5.11)
RDW: 15.1 % (ref 11.5–15.5)
WBC: 10.4 10*3/uL (ref 4.0–10.5)

## 2017-02-28 LAB — BASIC METABOLIC PANEL
Anion gap: 13 (ref 5–15)
BUN: 20 mg/dL (ref 6–20)
CHLORIDE: 93 mmol/L — AB (ref 101–111)
CO2: 25 mmol/L (ref 22–32)
CREATININE: 0.83 mg/dL (ref 0.44–1.00)
Calcium: 8.9 mg/dL (ref 8.9–10.3)
GFR calc Af Amer: >60 mL/min (ref >60)
GFR calc non Af Amer: >60 mL/min (ref >60)
GLUCOSE: 218 mg/dL — AB (ref 65–99)
Potassium: 4.7 mmol/L (ref 3.5–5.1)
Sodium: 131 mmol/L — ABNORMAL LOW (ref 135–145)

## 2017-02-28 LAB — GLUCOSE, CAPILLARY
GLUCOSE-CAPILLARY: 186 mg/dL — AB (ref 65–99)
Glucose-Capillary: 191 mg/dL — ABNORMAL HIGH (ref 65–99)
Glucose-Capillary: 219 mg/dL — ABNORMAL HIGH (ref 65–99)
Glucose-Capillary: 194 mg/dL — ABNORMAL HIGH (ref 65–99)

## 2017-02-28 MED ORDER — BENZONATATE 100 MG PO CAPS
100.0000 mg | ORAL_CAPSULE | Freq: Three times a day (TID) | ORAL | Status: DC
  Administered 2017-02-28 – 2017-03-04 (×14): 100 mg via ORAL
  Filled 2017-02-28 (×14): qty 1

## 2017-02-28 NOTE — Progress Notes (Signed)
[facility] PROGRESS NOTE  Cindy Padilla UJW:[id number] DOB: 1940/10/15 DOA: 02/24/2017  PCP: Ron Parker, MD  Brief History/Interval Summary: 76 year old Caucasian female with a past medical history of hypertension, diabetes, dementia, COPD, bipolar disorder, presented with shortness of breath. She was noted have COPD exacerbation. She was hospitalized for further management.  Reason for Visit: Acute COPD exacerbation  Consultants: None  Procedures: None  Antibiotics: Levaquin  Subjective/Interval History: Patient states that she is feeling better but continues to have wheezing. She is not a very good historian and unable to determine what her breathing is like at baseline.   ROS: Denies any nausea, vomiting  Objective:  Vital Signs  Vitals:   02/27/17 1918 02/27/17 2328 02/28/17 0623 02/28/17 0738  BP:  (!) 163/68 129/64   Pulse:  79 79   Resp:  20 20   Temp:  98 F (36.7 C) 98 F (36.7 C)   TempSrc:  Oral Oral   SpO2: 96% 97% 94% 96%  Weight:      Height:        Intake/Output Summary (Last 24 hours) at 02/28/17 0935 Last data filed at 02/28/17 0841  Gross per 24 hour  Intake              720 ml  Output                0 ml  Net              720 ml   Filed Weights   02/24/17 1520  Weight: 88.5 kg (195 lb)    General appearance: Awake, alert. No distress. Resp: Air entry appears to be improved compared to yesterday. Continues to have wheezing bilaterally. Less tachypneic. No use of accessory muscles.  Cardio: S1, S2 is regular. GI: Abdomen remains soft. Nontender. Nondistended. Extremities: No edema Neurologic: No focal deficits.  Lab Results:  Data Reviewed: I have personally reviewed following labs and imaging studies  CBC:  Recent Labs Lab 02/24/17 1551 02/25/17 0629 02/26/17 0603 02/28/17 0528  WBC 7.8 6.3 13.8* 10.4  NEUTROABS 3.1  --   --   --   HGB 10.3* 10.3* 10.3* 10.9*  HCT 30.4* 30.6* 30.3* 33.0*  MCV 88.4 87.9  86.6 89.4  PLT 204 197 212 242    Basic Metabolic Panel:  Recent Labs Lab 02/24/17 1551 02/25/17 0629 02/26/17 0603 02/28/17 0528  NA 131* 130* 131* 131*  K 3.4* 4.1 4.5 4.7  CL 94* 97* 96* 93*  CO2 26 20* 24 25  GLUCOSE 135* 178* 172* 218*  BUN CREATININE 0.79 0.74 0.76 0.83  CALCIUM 8.7* 9.1 9.1 8.9    GFR: Estimated Creatinine Clearance: 56.2 mL/min (by C-G formula based on SCr of 0.83 mg/dL).  Liver Function Tests:  Recent Labs Lab 02/24/17 1551  AST 25  ALT 19  ALKPHOS 107  BILITOT 0.4  PROT 7.1  ALBUMIN 3.7    Cardiac Enzymes:  Recent Labs Lab 02/24/17 1551  TROPONINI <0.03    CBG:  Recent Labs Lab 02/27/17 0732 02/27/17 1140 02/27/17 1716 02/27/17 2233 02/28/17 0722  GLUCAP 202* 224* 172* 201* 191*     Recent Results (from the past 240 hour(s))  MRSA PCR Screening     Status: None   Collection Time: 02/25/17  4:18 AM  Result Value Ref Range Status   MRSA by PCR NEGATIVE NEGATIVE Final    Comment:        The  GeneXpert MRSA Assay (FDA approved for NASAL specimens only), is one component of a comprehensive MRSA colonization surveillance program. It is not intended to diagnose MRSA infection nor to guide or monitor treatment for MRSA infections.       Radiology Studies: No results found.   Medications:  Scheduled: . amLODipine  5 mg Oral BID  . antiseptic oral rinse  15 mL Mouth Rinse QHS  . atorvastatin  40 mg Oral q1800  . budesonide (PULMICORT) nebulizer solution  0.25 mg Nebulization BID  . clopidogrel  75 mg Oral Daily  . dicyclomine  20 mg Oral BID  . donepezil  10 mg Oral QHS  . enoxaparin (LOVENOX) injection  40 mg Subcutaneous QHS  . famotidine  20 mg Oral QHS  . ferrous sulfate  325 mg Oral Q breakfast  . fluticasone  2 spray Each Nare Daily  . furosemide  20 mg Oral Q breakfast  . guaiFENesin  600 mg Oral BID  . insulin aspart  0-15 Units Subcutaneous TID WC  . ipratropium-albuterol  3 mL  Nebulization QID  . irbesartan  300 mg Oral Daily  . lamoTRIgine  200 mg Oral BID  . levofloxacin  750 mg Oral Daily  . loratadine  10 mg Oral Daily  . LORazepam  1 mg Oral Q12H  . magnesium oxide  400 mg Oral Daily  . methylPREDNISolone (SOLU-MEDROL) injection  80 mg Intravenous Q8H  . montelukast  10 mg Oral QHS  . pantoprazole  40 mg Oral Daily  . QUEtiapine  150 mg Oral BID  . senna-docusate  1 tablet Oral BID   Continuous:  [id number] **OR** acetaminophen, albuterol, alum & mag hydroxide-simeth, guaiFENesin-dextromethorphan, magnesium hydroxide, ondansetron **OR** ondansetron (ZOFRAN) IV, phenylephrine-shark liver oil-mineral oil-petrolatum, vitamin A & D  Assessment/Plan:  Principal Problem:   COPD with acute exacerbation (HCC) Active Problems:   HTN (hypertension)   Dementia with behavioral disturbance   Bipolar 1 disorder (HCC)   Normocytic anemia   Acute exacerbation of chronic obstructive pulmonary disease (COPD) (HCC)    Acute COPD exacerbation. Patient has been very slow to improve, although she appears to be making some progress. Continue current management for now with nebulizer treatments, steroids, antibiotics and nebulized budesonide. Seen by PT and OT. Will need home health.  Hyperglycemia Elevated blood glucose level is due to steroids. Continue sliding scale insulin coverage.  History of essential hypertension Continue home medications. Some fluctuation noted in blood pressure but for the most part it is reasonably well controlled.  History of dementia, bipolar disorder Stable. Continue home medications.  Chronic lower extremity wound. Wound care nurse is following. Patient was receiving home health for the same.  Normocytic anemia. Hemoglobin is stable. No evidence for bleeding. Continue to monitor.  Hyponatremia. This is mild. Etiology unclear. Continue to monitor.  DVT Prophylaxis: Lovenox    Code Status: DO NOT RESUSCITATE  Family  Communication: Discussed with the patient Disposition Plan: Management as outlined above.    LOS: 2 days   [facility]  [facility] Pager [phone] 02/28/2017, 9:35 AM  If 7PM-7AM, please contact night-coverage at [email or web address], password [facility]

## 2017-02-28 NOTE — Progress Notes (Signed)
Date:  February 28, 2017 Chart reviewed for concurrent status and case management needs.  Will continue to follow patient progress.  Discharge Planning: following for needs  Expected discharge date: March 03, 2017  Rhonda Davis, BSN, RN3, CCM   [phone]  

## 2017-02-28 NOTE — Progress Notes (Signed)
Physical Therapy Treatment Patient Details Name: Cindy Padilla MRN: [id number] DOB: 09-04-1940 Today's Date: 02/28/2017    History of Present Illness  Cindy Padilla is a 76 y.o. female with medical history significant of HTN; DM; dementia; COPD not on home O2; and bipolar d/o presenting with SOB.     PT Comments    Pt ambulated 300' with RW, no loss of balance, SaO2 90% on room air, wheezing noted, RN called for breathing tx. Overall, pt is doing well with mobility.    Follow Up Recommendations  No PT follow up     Equipment Recommendations  None recommended by PT    Recommendations for Other Services       Precautions / Restrictions Precautions Precautions: Fall Precaution Comments: watch O2 sats Restrictions Weight Bearing Restrictions: No    Mobility  Bed Mobility Overal bed mobility: Modified Independent                Transfers Overall transfer level: Needs assistance Equipment used: Rolling walker (2 wheeled) Transfers: Sit to/from Stand Sit to Stand: Supervision         General transfer comment: VC for hand placement   Ambulation/Gait Ambulation/Gait assistance: Supervision Ambulation Distance (Feet): 300 Feet Assistive device: Rolling walker (2 wheeled) Gait Pattern/deviations: Step-through pattern;Decreased stride length   Gait velocity interpretation: at or above normal speed for age/gender General Gait Details: VCs pursed lip breathing, 2/4 dyspnea, SaO2 90% on room air, wheezing noted -RN notified and has called for a breathing tx; steady, no LOB   Stairs            Wheelchair Mobility    Modified Rankin (Stroke Patients Only)       Balance Overall balance assessment: Needs assistance Sitting-balance support: No upper extremity supported Sitting balance-Leahy Scale: Good     Standing balance support: No upper extremity supported Standing balance-Leahy Scale: Good                               Cognition Arousal/Alertness: Awake/alert Behavior During Therapy: Anxious;WFL for tasks assessed/performed Overall Cognitive Status: Within Functional Limits for tasks assessed                                        Exercises      General Comments        Pertinent Vitals/Pain Pain Assessment: No/denies pain    Home Living                      Prior Function            PT Goals (current goals can now be found in the care plan section) Acute Rehab PT Goals Patient Stated Goal: To return to independent PT Goal Formulation: With patient Time For Goal Achievement: 03/07/17 Potential to Achieve Goals: Good Additional Goals Additional Goal #1: Patient to self monitor and perform pursed lip breathing as needed for dyspnea without cues.  Progress towards PT goals: Progressing toward goals    Frequency    Min 3X/week      PT Plan Current plan remains appropriate    Co-evaluation              AM-PAC PT "6 Clicks" Daily Activity  Outcome Measure  Difficulty turning over in bed (including adjusting bedclothes, sheets and blankets)?: None Difficulty  moving from lying on back to sitting on the side of the bed? : None Difficulty sitting down on and standing up from a chair with arms (e.g., wheelchair, bedside commode, etc,.)?: None Help needed moving to and from a bed to chair (including a wheelchair)?: None Help needed walking in hospital room?: A Little Help needed climbing 3-5 steps with a railing? : A Little 6 Click Score: 22    End of Session Equipment Utilized During Treatment: Gait belt Activity Tolerance: Patient tolerated treatment well Patient left: in bed;with call bell/phone within reach;with bed alarm set Nurse Communication: Mobility status PT Visit Diagnosis: Difficulty in walking, not elsewhere classified (R26.2);Muscle weakness (generalized) (M62.81)     Time: 0981-1914 PT Time Calculation (min) (ACUTE ONLY): 12  min  Charges:  $Gait Training: 8-22 mins                    G Codes:       [phone]   Tamala Ser 02/28/2017, 12:35 PM

## 2017-03-01 ENCOUNTER — Inpatient Hospital Stay (HOSPITAL_COMMUNITY): Payer: Medicare Other

## 2017-03-01 DIAGNOSIS — R05 Cough: Secondary | ICD-10-CM

## 2017-03-01 LAB — GLUCOSE, CAPILLARY
GLUCOSE-CAPILLARY: 203 mg/dL — AB (ref 65–99)
GLUCOSE-CAPILLARY: 242 mg/dL — AB (ref 65–99)
GLUCOSE-CAPILLARY: 204 mg/dL — AB (ref 65–99)
GLUCOSE-CAPILLARY: 221 mg/dL — AB (ref 65–99)

## 2017-03-01 MED ORDER — PHENOL 1.4 % MT LIQD
1.0000 | OROMUCOSAL | Status: DC | PRN
  Filled 2017-03-01: qty 177

## 2017-03-01 MED ORDER — LEVOFLOXACIN 750 MG PO TABS
750.0000 mg | ORAL_TABLET | Freq: Every day | ORAL | Status: DC
  Administered 2017-03-02 – 2017-03-04 (×3): 750 mg via ORAL
  Filled 2017-03-01 (×3): qty 1

## 2017-03-01 MED ORDER — METHYLPREDNISOLONE SODIUM SUCC 125 MG IJ SOLR
80.0000 mg | Freq: Two times a day (BID) | INTRAMUSCULAR | Status: DC
  Administered 2017-03-01 – 2017-03-02 (×2): 80 mg via INTRAVENOUS
  Filled 2017-03-01 (×2): qty 2

## 2017-03-01 NOTE — Progress Notes (Signed)
[facility] PROGRESS NOTE  Cindy Padilla ZOX:[id number] DOB: 1940-08-14 DOA: 02/24/2017  PCP: Ron Parker, MD  Brief History/Interval Summary: 76 year old Caucasian female with a past medical history of hypertension, diabetes, dementia, COPD, bipolar disorder, presented with shortness of breath. She was noted have COPD exacerbation. She was hospitalized for further management.  Reason for Visit: Acute COPD exacerbation  Consultants: None  Procedures: None  Antibiotics: Levaquin  Subjective/Interval History: Patient continues to have a cough along with wheezing. She does feel better but not back to her baseline yet. Not a very good historian.   ROS: Denies any nausea or vomiting  Objective:  Vital Signs  Vitals:   02/28/17 2006 03/01/17 0543 03/01/17 0745 03/01/17 1150  BP: (!) 170/94 (!) 154/68    Pulse: 86 70    Resp: (!) 22 (!) 22    Temp: 97.7 F (36.5 C) 98.3 F (36.8 C)    TempSrc: Oral Oral    SpO2: 95% 93% 94% 94%  Weight:      Height:        Intake/Output Summary (Last 24 hours) at 03/01/17 1243 Last data filed at 03/01/17 0813  Gross per 24 hour  Intake              480 ml  Output                0 ml  Net              480 ml   Filed Weights   02/24/17 1520  Weight: 88.5 kg (195 lb)    General appearance: Awake, alert. In no distress. Resp: Not as tachypneic as before, but continues to cough. Continues to have wheezing, but less than before. Upper airway wheezing is noted. Few scattered crackles. Cardio: S1, S2 is normal, regular. No S3, S4. No rubs, murmurs GI: Abdomen is soft. Nontender, nondistended. Bowel sounds are present. No masses or organomegaly Extremities: No edema Neurologic: No focal deficits.  Lab Results:  Data Reviewed: I have personally reviewed following labs and imaging studies  CBC:  Recent Labs Lab 02/24/17 1551 02/25/17 0629 02/26/17 0603 02/28/17 0528  WBC 7.8 6.3 13.8* 10.4  NEUTROABS 3.1  --    --   --   HGB 10.3* 10.3* 10.3* 10.9*  HCT 30.4* 30.6* 30.3* 33.0*  MCV 88.4 87.9 86.6 89.4  PLT 204 197 212 242    Basic Metabolic Panel:  Recent Labs Lab 02/24/17 1551 02/25/17 0629 02/26/17 0603 02/28/17 0528  NA 131* 130* 131* 131*  K 3.4* 4.1 4.5 4.7  CL 94* 97* 96* 93*  CO2 26 20* 24 25  GLUCOSE 135* 178* 172* 218*  BUN CREATININE 0.79 0.74 0.76 0.83  CALCIUM 8.7* 9.1 9.1 8.9    GFR: Estimated Creatinine Clearance: 56.2 mL/min (by C-G formula based on SCr of 0.83 mg/dL).  Liver Function Tests:  Recent Labs Lab 02/24/17 1551  AST 25  ALT 19  ALKPHOS 107  BILITOT 0.4  PROT 7.1  ALBUMIN 3.7    Cardiac Enzymes:  Recent Labs Lab 02/24/17 1551  TROPONINI <0.03    CBG:  Recent Labs Lab 02/28/17 1200 02/28/17 1700 02/28/17 2009 03/01/17 0728 03/01/17 1205  GLUCAP 186* 219* 194* 203* 242*     Recent Results (from the past 240 hour(s))  MRSA PCR Screening     Status: None   Collection Time: 02/25/17  4:18 AM  Result Value Ref Range Status  MRSA by PCR NEGATIVE NEGATIVE Final    Comment:        The GeneXpert MRSA Assay (FDA approved for NASAL specimens only), is one component of a comprehensive MRSA colonization surveillance program. It is not intended to diagnose MRSA infection nor to guide or monitor treatment for MRSA infections.       Radiology Studies: Dg Chest 2 View  Result Date: 03/01/2017 CLINICAL DATA:  Cough and chest congestion. EXAM: CHEST  2 VIEW COMPARISON:  02/24/2017 FINDINGS: Chronic left ventricular prominence. Chronic aortic atherosclerosis. There is mild patchy density in the right middle lobe or lingula consistent with mild pneumonia. The remainder the chest is clear. No effusions. Left-sided neurostimulator as seen previously. No significant bone finding. IMPRESSION: Patchy density best seen on the lateral consistent with mild pneumonia in the right middle lobe or lingula. Electronically Signed   By:  Paulina Fusi M.D.   On: 03/01/2017 10:48     Medications:  Scheduled: . amLODipine  5 mg Oral BID  . antiseptic oral rinse  15 mL Mouth Rinse QHS  . atorvastatin  40 mg Oral q1800  . benzonatate  100 mg Oral TID  . budesonide (PULMICORT) nebulizer solution  0.25 mg Nebulization BID  . clopidogrel  75 mg Oral Daily  . dicyclomine  20 mg Oral BID  . donepezil  10 mg Oral QHS  . enoxaparin (LOVENOX) injection  40 mg Subcutaneous QHS  . famotidine  20 mg Oral QHS  . ferrous sulfate  325 mg Oral Q breakfast  . fluticasone  2 spray Each Nare Daily  . furosemide  20 mg Oral Q breakfast  . guaiFENesin  600 mg Oral BID  . insulin aspart  0-15 Units Subcutaneous TID WC  . ipratropium-albuterol  3 mL Nebulization QID  . irbesartan  300 mg Oral Daily  . lamoTRIgine  200 mg Oral BID  . [START ON 03/02/2017] levofloxacin  750 mg Oral Daily  . loratadine  10 mg Oral Daily  . LORazepam  1 mg Oral Q12H  . magnesium oxide  400 mg Oral Daily  . methylPREDNISolone (SOLU-MEDROL) injection  80 mg Intravenous Q8H  . montelukast  10 mg Oral QHS  . pantoprazole  40 mg Oral Daily  . QUEtiapine  150 mg Oral BID  . senna-docusate  1 tablet Oral BID   Continuous:  [id number] **OR** acetaminophen, albuterol, alum & mag hydroxide-simeth, guaiFENesin-dextromethorphan, magnesium hydroxide, ondansetron **OR** ondansetron (ZOFRAN) IV, phenol, phenylephrine-shark liver oil-mineral oil-petrolatum, vitamin A & D  Assessment/Plan:  Principal Problem:   COPD with acute exacerbation (HCC) Active Problems:   HTN (hypertension)   Dementia with behavioral disturbance   Bipolar 1 disorder (HCC)   Normocytic anemia   Acute exacerbation of chronic obstructive pulmonary disease (COPD) (HCC)    Acute COPD exacerbation/Pneumonia, possibly aspiration Patient has been very slow to improve. She continues to have a cough. There appears to be an upper airway component to her symptoms. She appears to be on maximal  treatment. Her cough is dry for the most part. We could try an antitussive agent. She is already on a nasal spray. She is also on Claritin and leukotriene inhibitor. Chest x-ray was repeated, which raised concern for opacity in the right lung. Patient has been on Levaquin. Initial film done at the time of admission did not show any pneumonia. However, patient is afebrile. WBC has been normal. This could have been present at the time of admission but not seen on chest x-ray  at that time. Patient does have a history of dementia. She could've aspirated as well. We will request a speech therapy evaluation. Continue other treatment as previously outlined including nebulizer treatments, steroids, antibiotics and nebulized budesonide. Seen by PT and OT. Will need home health.  Hyperglycemia Elevated blood glucose level is due to steroids. Continue sliding scale insulin coverage.  History of essential hypertension Continue home medications. Some fluctuation noted in blood pressure but for the most part it is reasonably well controlled.  History of dementia, bipolar disorder Stable. Continue home medications.  Chronic lower extremity wound Wound care nurse is following. Patient was receiving home health for the same.  Normocytic anemia. Hemoglobin is stable. No evidence for bleeding. Continue to monitor.  Hyponatremia. This is mild. Etiology unclear. Continue to monitor.  DVT Prophylaxis: Lovenox    Code Status: DO NOT RESUSCITATE  Family Communication: Discussed with the patient Disposition Plan: Management as outlined above.    LOS: 3 days   [facility]  [facility] Pager [phone] 03/01/2017, 12:43 PM  If 7PM-7AM, please contact night-coverage at [email or web address], password [facility]

## 2017-03-01 NOTE — Evaluation (Signed)
Clinical/Bedside Swallow Evaluation Patient Details  Name: Cindy Padilla MRN: [id number] Date of Birth: 08-26-1940  Today's Date: 03/01/2017 Time: SLP Start Time (ACUTE ONLY): 1330 SLP Stop Time (ACUTE ONLY): 1350 SLP Time Calculation (min) (ACUTE ONLY): 20 min  Past Medical History:  Past Medical History:  Diagnosis Date  . Arthritis   . Bipolar 1 disorder (HCC)   . Bronchitis   . Chronic back pain   . Constipation   . COPD (chronic obstructive pulmonary disease) (HCC)   . Dementia   . Diabetes mellitus   . GERD (gastroesophageal reflux disease)   . Hiatal hernia   . Hypertension   . IBS (irritable bowel syndrome)    Past Surgical History:  Past Surgical History:  Procedure Laterality Date  . gallbladder    . MANDIBLE SURGERY    . PARTIAL HYSTERECTOMY     HPI:  Patient is a 77 y.o. female with a PMH: of hypertension, diabetes, dementia, COPD, bipolar disorder, presented with shortness of breath. She was noted to have COPD exacerbation.    Assessment / Plan / Recommendation Clinical Impression  Patient presents with a mild oropharyngeal dysphagia, however it was difficult to differentiate between swallowing difficulty versus COPD exacerbation as far as causing coughing. Patient exhibited dry cough, wheezing and c/o of feeling cough deep in chest as well as "tightness" in throat, and this was all prior to any PO's. Patient did not appear to exhibit increased frequency or severity of coughing when taking sips of thin liquids. Solids were not tested, as patient could not stop her coughing, however patient (confirmed by RN) reported she ate spaghetti for lunch and had no problems. RN told this SLP that patient did not cough when eating, and that her coughing is brought on by increase in her anxiety and activity level. She also does not cough when she is resting/sleeping. SLP Visit Diagnosis: Dysphagia, unspecified (R13.10)    Aspiration Risk  Mild aspiration risk    Diet  Recommendation Regular;Thin liquid   Liquid Administration via: Cup;Straw Medication Administration: Whole meds with liquid Supervision: Patient able to self feed;Intermittent supervision to cue for compensatory strategies Compensations: Minimize environmental distractions;Slow rate;Small sips/bites Postural Changes: Seated upright at 90 degrees    Other  Recommendations Oral Care Recommendations: Oral care BID   Follow up Recommendations Other (comment) (Pending progress)      Frequency and Duration min 1 x/week  1 week       Prognosis Prognosis for Safe Diet Advancement: Good      Swallow Study   General Date of Onset: 02/28/17 HPI: Patient is a 76 y.o. female with a PMH: of hypertension, diabetes, dementia, COPD, bipolar disorder, presented with shortness of breath. She was noted to have COPD exacerbation.  Type of Study: Bedside Swallow Evaluation Previous Swallow Assessment: N/A Diet Prior to this Study: Regular;Thin liquids Temperature Spikes Noted: No Respiratory Status: Room air History of Recent Intubation: No Behavior/Cognition: Alert;Cooperative;Confused;Requires cueing;Distractible Oral Cavity Assessment: Within Functional Limits Oral Care Completed by SLP: No Oral Cavity - Dentition: Adequate natural dentition Vision: Functional for self-feeding Self-Feeding Abilities: Able to feed self Patient Positioning: Upright in bed Baseline Vocal Quality: Hoarse (mildly hoarse) Volitional Cough: Strong Volitional Swallow: Unable to elicit    Oral/Motor/Sensory Function Overall Oral Motor/Sensory Function: Within functional limits   Ice Chips     Thin Liquid Thin Liquid: Impaired Presentation: Cup Pharyngeal  Phase Impairments: Throat Clearing - Immediate;Throat Clearing - Delayed Other Comments: Difficult to differntiate between chronic cough  related to COPD exacerbation from possible impact from liquids    Nectar Thick Nectar Thick Liquid: Not tested   Honey  Thick Honey Thick Liquid: Not tested   Puree Puree: Not tested   Solid   GO   Solid: Not tested       Angela Nevin, MA, CCC-SLP 03/01/17 3:22 PM

## 2017-03-02 DIAGNOSIS — F0391 Unspecified dementia with behavioral disturbance: Secondary | ICD-10-CM

## 2017-03-02 LAB — GLUCOSE, CAPILLARY
GLUCOSE-CAPILLARY: 195 mg/dL — AB (ref 65–99)
GLUCOSE-CAPILLARY: 162 mg/dL — AB (ref 65–99)
Glucose-Capillary: 246 mg/dL — ABNORMAL HIGH (ref 65–99)
Glucose-Capillary: 212 mg/dL — ABNORMAL HIGH (ref 65–99)

## 2017-03-02 MED ORDER — METHYLPREDNISOLONE SODIUM SUCC 40 MG IJ SOLR
40.0000 mg | Freq: Two times a day (BID) | INTRAMUSCULAR | Status: DC
  Administered 2017-03-02 – 2017-03-03 (×2): 40 mg via INTRAVENOUS
  Filled 2017-03-02 (×2): qty 1

## 2017-03-02 MED ORDER — LIDOCAINE HCL (PF) 4 % IJ SOLN: 3.0000 mL | Freq: Three times a day (TID) | INTRAMUSCULAR | Status: DC

## 2017-03-02 MED ORDER — LIDOCAINE HCL (PF) 4 % IJ SOLN
3.0000 mL | Freq: Three times a day (TID) | INTRAMUSCULAR | Status: AC
  Administered 2017-03-02 (×3): 3 mL via RESPIRATORY_TRACT
  Filled 2017-03-02 (×3): qty 5

## 2017-03-02 NOTE — Progress Notes (Signed)
[facility] PROGRESS NOTE  Cindy Padilla WUJ:[id number] DOB: Apr 07, 1941 DOA: 02/24/2017  PCP: Ron Parker, MD  Brief History/Interval Summary: 76 year old Caucasian female with a past medical history of hypertension, diabetes, dementia, COPD, bipolar disorder, presented with shortness of breath. She was noted have COPD exacerbation. She was hospitalized for further management.  Reason for Visit: Acute COPD exacerbation  Consultants: None  Procedures: None  Antibiotics: Levaquin  Subjective/Interval History: Patient continues to complain of foot dry cough. Continues to have some wheezing. Denies any chest pain. Unfortunately, she is not a very good historian.   ROS: Denies any nausea or vomiting  Objective:  Vital Signs  Vitals:   03/01/17 1700 03/01/17 2015 03/01/17 2042 03/02/17 0500  BP: (!) 149/64 (!) 143/82  (!) 166/80  Pulse: 79 90  80  Resp: 18 19    Temp: 97.8 F (36.6 C) 98.1 F (36.7 C)    TempSrc: Oral Oral    SpO2: 99% 95% 94% 94%  Weight:      Height:        Intake/Output Summary (Last 24 hours) at 03/02/17 0943 Last data filed at 03/01/17 1720  Gross per 24 hour  Intake              480 ml  Output                0 ml  Net              480 ml   Filed Weights   02/24/17 1520  Weight: 88.5 kg (195 lb)    General appearance: Awake, alert. In no distress. Resp: Effort appears to be normal. No use of accessory muscle. She does have upper airway wheezing. Lungs are mostly clear to auscultation today. Few scattered crackles.  Cardio: S1, S2 is normal, regular. No S3, S4. No rubs, murmurs, or bruit GI: Abdomen is soft. Nontender, nondistended. Bowel sounds are present. No masses or organomegaly.  Extremities: No edema Neurologic: No focal deficits.  Lab Results:  Data Reviewed: I have personally reviewed following labs and imaging studies  CBC:  Recent Labs Lab 02/24/17 1551 02/25/17 0629 02/26/17 0603 02/28/17 0528  WBC  7.8 6.3 13.8* 10.4  NEUTROABS 3.1  --   --   --   HGB 10.3* 10.3* 10.3* 10.9*  HCT 30.4* 30.6* 30.3* 33.0*  MCV 88.4 87.9 86.6 89.4  PLT 204 197 212 242    Basic Metabolic Panel:  Recent Labs Lab 02/24/17 1551 02/25/17 0629 02/26/17 0603 02/28/17 0528  NA 131* 130* 131* 131*  K 3.4* 4.1 4.5 4.7  CL 94* 97* 96* 93*  CO2 26 20* 24 25  GLUCOSE 135* 178* 172* 218*  BUN CREATININE 0.79 0.74 0.76 0.83  CALCIUM 8.7* 9.1 9.1 8.9    GFR: Estimated Creatinine Clearance: 56.2 mL/min (by C-G formula based on SCr of 0.83 mg/dL).  Liver Function Tests:  Recent Labs Lab 02/24/17 1551  AST 25  ALT 19  ALKPHOS 107  BILITOT 0.4  PROT 7.1  ALBUMIN 3.7    Cardiac Enzymes:  Recent Labs Lab 02/24/17 1551  TROPONINI <0.03    CBG:  Recent Labs Lab 03/01/17 0728 03/01/17 1205 03/01/17 1647 03/01/17 2013 03/02/17 0735  GLUCAP 203* 242* 204* 221* 195*     Recent Results (from the past 240 hour(s))  MRSA PCR Screening     Status: None   Collection Time: 02/25/17  4:18 AM  Result Value Ref  Range Status   MRSA by PCR NEGATIVE NEGATIVE Final    Comment:        The GeneXpert MRSA Assay (FDA approved for NASAL specimens only), is one component of a comprehensive MRSA colonization surveillance program. It is not intended to diagnose MRSA infection nor to guide or monitor treatment for MRSA infections.       Radiology Studies: Dg Chest 2 View  Result Date: 03/01/2017 CLINICAL DATA:  Cough and chest congestion. EXAM: CHEST  2 VIEW COMPARISON:  02/24/2017 FINDINGS: Chronic left ventricular prominence. Chronic aortic atherosclerosis. There is mild patchy density in the right middle lobe or lingula consistent with mild pneumonia. The remainder the chest is clear. No effusions. Left-sided neurostimulator as seen previously. No significant bone finding. IMPRESSION: Patchy density best seen on the lateral consistent with mild pneumonia in the right middle lobe  or lingula. Electronically Signed   By: Paulina Fusi M.D.   On: 03/01/2017 10:48     Medications:  Scheduled: . amLODipine  5 mg Oral BID  . antiseptic oral rinse  15 mL Mouth Rinse QHS  . atorvastatin  40 mg Oral q1800  . benzonatate  100 mg Oral TID  . budesonide (PULMICORT) nebulizer solution  0.25 mg Nebulization BID  . clopidogrel  75 mg Oral Daily  . dicyclomine  20 mg Oral BID  . donepezil  10 mg Oral QHS  . enoxaparin (LOVENOX) injection  40 mg Subcutaneous QHS  . famotidine  20 mg Oral QHS  . ferrous sulfate  325 mg Oral Q breakfast  . fluticasone  2 spray Each Nare Daily  . furosemide  20 mg Oral Q breakfast  . guaiFENesin  600 mg Oral BID  . insulin aspart  0-15 Units Subcutaneous TID WC  . ipratropium-albuterol  3 mL Nebulization QID  . irbesartan  300 mg Oral Daily  . lamoTRIgine  200 mg Oral BID  . levofloxacin  750 mg Oral Daily  . lidocaine  3 mL Other TID  . loratadine  10 mg Oral Daily  . LORazepam  1 mg Oral Q12H  . magnesium oxide  400 mg Oral Daily  . methylPREDNISolone (SOLU-MEDROL) injection  80 mg Intravenous Q12H  . montelukast  10 mg Oral QHS  . pantoprazole  40 mg Oral Daily  . QUEtiapine  150 mg Oral BID  . senna-docusate  1 tablet Oral BID   Continuous:  EAV:[id number] **OR** acetaminophen, albuterol, alum & mag hydroxide-simeth, guaiFENesin-dextromethorphan, magnesium hydroxide, ondansetron **OR** ondansetron (ZOFRAN) IV, phenol, phenylephrine-shark liver oil-mineral oil-petrolatum, vitamin A & D  Assessment/Plan:  Principal Problem:   COPD with acute exacerbation (HCC) Active Problems:   HTN (hypertension)   Dementia with behavioral disturbance   Bipolar 1 disorder (HCC)   Normocytic anemia   Acute exacerbation of chronic obstructive pulmonary disease (COPD) (HCC)    Acute COPD exacerbation/Pneumonia, possibly aspiration Patient has been very slow to improve. She has a dry cough. She started off with the COPD exacerbation, but it  appears that most of her current symptoms are upper airway in origin. Her lungs are mostly clear to auscultation. She appears to be on maximal treatment.  Continue antibiotics, nebulizer treatments, steroids. Taper steroids. Continue antitussive agents. She is already on a nasal spray. She is also on Claritin and leukotriene inhibitor. We will give her nebulized lidocaine to see if that helps with this cough that she has. Chest x-ray done yesterday did raise concern for pneumonia but clinically she does not appear to  have worsening infection. Continue Levaquin for now. Speech therapy has seen the patient and she does not have any overt signs of aspiration. She will need home health at the time of discharge. Anticipate discharge in the next 24-48 hours.   Hyperglycemia Elevated blood glucose level is due to steroids. Should improve as steroid is tapered down. Continue sliding scale insulin coverage.  History of essential hypertension Continue home medications. Elevated blood pressure, most likely due to steroids.  History of dementia, bipolar disorder Stable. Continue home medications.  Chronic lower extremity wound Wound care nurse is following. Patient was receiving home health for the same.  Normocytic anemia. Hemoglobin is stable. No evidence for bleeding. Continue to monitor.  Hyponatremia. This is mild. Etiology unclear. Continue to monitor.  DVT Prophylaxis: Lovenox    Code Status: DO NOT RESUSCITATE  Family Communication: Discussed with the patient Disposition Plan: Hopefully discharge in the next 24-48 hours.    LOS: 4 days   [facility]  [facility] Pager [phone] 03/02/2017, 9:43 AM  If 7PM-7AM, please contact night-coverage at [email or web address], password [facility]

## 2017-03-03 LAB — BASIC METABOLIC PANEL
Anion gap: 14 (ref 5–15)
BUN: 16 mg/dL (ref 6–20)
CHLORIDE: 90 mmol/L — AB (ref 101–111)
CO2: 27 mmol/L (ref 22–32)
CREATININE: 0.83 mg/dL (ref 0.44–1.00)
Calcium: 8.9 mg/dL (ref 8.9–10.3)
Glucose, Bld: 203 mg/dL — ABNORMAL HIGH (ref 65–99)
POTASSIUM: 4.5 mmol/L (ref 3.5–5.1)
SODIUM: 131 mmol/L — AB (ref 135–145)

## 2017-03-03 LAB — GLUCOSE, CAPILLARY
GLUCOSE-CAPILLARY: 172 mg/dL — AB (ref 65–99)
GLUCOSE-CAPILLARY: 225 mg/dL — AB (ref 65–99)
Glucose-Capillary: 163 mg/dL — ABNORMAL HIGH (ref 65–99)

## 2017-03-03 LAB — CBC
HEMATOCRIT: 36.6 % (ref 36.0–46.0)
HEMOGLOBIN: 12 g/dL (ref 12.0–15.0)
MCH: 29 pg (ref 26.0–34.0)
MCHC: 32.8 g/dL (ref 30.0–36.0)
MCV: 88.4 fL (ref 78.0–100.0)
PLATELETS: 274 10*3/uL (ref 150–400)
RBC: 4.14 MIL/uL (ref 3.87–5.11)
RDW: 14.9 % (ref 11.5–15.5)
WBC: 15.3 10*3/uL — AB (ref 4.0–10.5)

## 2017-03-03 MED ORDER — METHYLPREDNISOLONE SODIUM SUCC 40 MG IJ SOLR
40.0000 mg | Freq: Every day | INTRAMUSCULAR | Status: DC
  Administered 2017-03-04: 40 mg via INTRAVENOUS
  Filled 2017-03-03: qty 1

## 2017-03-03 MED ORDER — LIDOCAINE HCL (PF) 4 % IJ SOLN
3.0000 mL | Freq: Three times a day (TID) | INTRAMUSCULAR | Status: AC
  Administered 2017-03-03 (×2): 3 mL via RESPIRATORY_TRACT
  Filled 2017-03-03 (×4): qty 5

## 2017-03-03 NOTE — Progress Notes (Signed)
[facility] PROGRESS NOTE  Cindy Padilla ZOX:[id number] DOB: 06/18/1940 DOA: 02/24/2017  PCP: Ron Parker, MD  Brief History/Interval Summary: 76 year old Caucasian female with a past medical history of hypertension, diabetes, dementia, COPD, bipolar disorder, presented with shortness of breath. She was noted have COPD exacerbation. She was hospitalized for further management.  Reason for Visit: Acute COPD exacerbation  Consultants: None  Procedures: None  Antibiotics: Levaquin  Subjective/Interval History: Patient has been doing okay according to nursing staff. She continues to have this cough. This is mostly dry. Denies any chest pain.   ROS: Denies any nausea or vomiting  Objective:  Vital Signs  Vitals:   03/02/17 2140 03/02/17 2145 03/03/17 0624 03/03/17 0700  BP:   (!) 142/73   Pulse:   72   Resp:   16   Temp:   98.3 F (36.8 C)   TempSrc:   Oral   SpO2: 96% 97% 93% 94%  Weight:      Height:        Intake/Output Summary (Last 24 hours) at 03/03/17 0942 Last data filed at 03/02/17 1700  Gross per 24 hour  Intake              480 ml  Output                0 ml  Net              480 ml   Filed Weights   02/24/17 1520  Weight: 88.5 kg (195 lb)    General appearance: Awake, alert. In no distress Resp: Normal respiratory effort. Some cough is noted with upper airway wheezing. Lungs are mostly clear to auscultation. Few scattered crackles. Cardio: S1, S2 is normal, regular. No S3, S4, no rubs, murmurs, or bruit GI: Abdomen is soft. Nontender, nondistended. Bowel sounds present. No masses or organomegaly Extremities: No edema Neurologic: No focal deficits.  Lab Results:  Data Reviewed: I have personally reviewed following labs and imaging studies  CBC:  Recent Labs Lab 02/24/17 1551 02/25/17 0629 02/26/17 0603 02/28/17 0528 03/03/17 0543  WBC 7.8 6.3 13.8* 10.4 15.3*  NEUTROABS 3.1  --   --   --   --   HGB 10.3* 10.3* 10.3*  10.9* 12.0  HCT 30.4* 30.6* 30.3* 33.0* 36.6  MCV 88.4 87.9 86.6 89.4 88.4  PLT 204 197 212 242 274    Basic Metabolic Panel:  Recent Labs Lab 02/24/17 1551 02/25/17 0629 02/26/17 0603 02/28/17 0528 03/03/17 0543  NA 131* 130* 131* 131* 131*  K 3.4* 4.1 4.5 4.7 4.5  CL 94* 97* 96* 93* 90*  CO2 26 20* GLUCOSE 135* 178* 172* 218* 203*  BUN CREATININE 0.79 0.74 0.76 0.83 0.83  CALCIUM 8.7* 9.1 9.1 8.9 8.9    GFR: Estimated Creatinine Clearance: 56.2 mL/min (by C-G formula based on SCr of 0.83 mg/dL).  Liver Function Tests:  Recent Labs Lab 02/24/17 1551  AST 25  ALT 19  ALKPHOS 107  BILITOT 0.4  PROT 7.1  ALBUMIN 3.7    Cardiac Enzymes:  Recent Labs Lab 02/24/17 1551  TROPONINI <0.03    CBG:  Recent Labs Lab 03/02/17 0735 03/02/17 1209 03/02/17 1655 03/02/17 2041 03/03/17 0745  GLUCAP 195* 246* 162* 212* 172*     Recent Results (from the past 240 hour(s))  MRSA PCR Screening     Status: None   Collection Time: 02/25/17  4:18 AM  Result Value Ref Range Status   MRSA by PCR NEGATIVE NEGATIVE Final    Comment:        The GeneXpert MRSA Assay (FDA approved for NASAL specimens only), is one component of a comprehensive MRSA colonization surveillance program. It is not intended to diagnose MRSA infection nor to guide or monitor treatment for MRSA infections.       Radiology Studies: Dg Chest 2 View  Result Date: 03/01/2017 CLINICAL DATA:  Cough and chest congestion. EXAM: CHEST  2 VIEW COMPARISON:  02/24/2017 FINDINGS: Chronic left ventricular prominence. Chronic aortic atherosclerosis. There is mild patchy density in the right middle lobe or lingula consistent with mild pneumonia. The remainder the chest is clear. No effusions. Left-sided neurostimulator as seen previously. No significant bone finding. IMPRESSION: Patchy density best seen on the lateral consistent with mild pneumonia in the right middle lobe or  lingula. Electronically Signed   By: Paulina Fusi M.D.   On: 03/01/2017 10:48     Medications:  Scheduled: . amLODipine  5 mg Oral BID  . antiseptic oral rinse  15 mL Mouth Rinse QHS  . atorvastatin  40 mg Oral q1800  . benzonatate  100 mg Oral TID  . budesonide (PULMICORT) nebulizer solution  0.25 mg Nebulization BID  . clopidogrel  75 mg Oral Daily  . dicyclomine  20 mg Oral BID  . donepezil  10 mg Oral QHS  . enoxaparin (LOVENOX) injection  40 mg Subcutaneous QHS  . famotidine  20 mg Oral QHS  . ferrous sulfate  325 mg Oral Q breakfast  . fluticasone  2 spray Each Nare Daily  . furosemide  20 mg Oral Q breakfast  . guaiFENesin  600 mg Oral BID  . insulin aspart  0-15 Units Subcutaneous TID WC  . ipratropium-albuterol  3 mL Nebulization QID  . irbesartan  300 mg Oral Daily  . lamoTRIgine  200 mg Oral BID  . levofloxacin  750 mg Oral Daily  . lidocaine  3 mL Inhalation TID  . loratadine  10 mg Oral Daily  . LORazepam  1 mg Oral Q12H  . magnesium oxide  400 mg Oral Daily  . [START ON 03/04/2017] methylPREDNISolone (SOLU-MEDROL) injection  40 mg Intravenous Daily  . montelukast  10 mg Oral QHS  . pantoprazole  40 mg Oral Daily  . QUEtiapine  150 mg Oral BID  . senna-docusate  1 tablet Oral BID   Continuous:  ZOX:[id number] **OR** acetaminophen, albuterol, alum & mag hydroxide-simeth, guaiFENesin-dextromethorphan, magnesium hydroxide, ondansetron **OR** ondansetron (ZOFRAN) IV, phenol, phenylephrine-shark liver oil-mineral oil-petrolatum, vitamin A & D  Assessment/Plan:  Principal Problem:   COPD with acute exacerbation (HCC) Active Problems:   HTN (hypertension)   Dementia with behavioral disturbance   Bipolar 1 disorder (HCC)   Normocytic anemia   Acute exacerbation of chronic obstructive pulmonary disease (COPD) (HCC)    Acute COPD exacerbation/Pneumonia, possibly aspiration Patient was very slow to improve. She started off with the COPD exacerbation, but it  appears that most of her current symptoms are upper airway in origin. Her lungs are mostly clear to auscultation. She appears to be on maximal treatment.  Continue antibiotics, nebulizer treatments, steroids. Taper steroids. Continue antitussive agents. She is already on a nasal spray. She is also on Claritin and leukotriene inhibitor. She did experience some relief with nebulized lidocaine. Give additional doses today. Chest x-ray done did raise concern for pneumonia but clinically she does not appear to have worsening infection. Continue Levaquin for  now. Speech therapy has seen the patient and she does not have any overt signs of aspiration. She will need home health at the time of discharge. Anticipate discharge tomorrow.   Hyperglycemia Elevated blood glucose level is due to steroids. CBGs have improved with tapering doses of steroids. Continue sliding scale insulin coverage.  History of essential hypertension Continue home medications. Elevated blood pressure, most likely due to steroids.  History of dementia, bipolar disorder Stable. Continue home medications.  Chronic lower extremity wound Wound care nurse is following. Patient was receiving home health for the same.  Normocytic anemia. Hemoglobin is stable. No evidence for bleeding. Continue to monitor.  Hyponatremia. This is mild. Etiology unclear. Continue to monitor.  DVT Prophylaxis: Lovenox    Code Status: DO NOT RESUSCITATE  Family Communication: Discussed with the patient Disposition Plan: Anticipate discharge tomorrow.    LOS: 5 days   [facility]  [facility] Pager [phone] 03/03/2017, 9:42 AM  If 7PM-7AM, please contact night-coverage at [email or web address], password [facility]

## 2017-03-03 NOTE — Care Management Important Message (Signed)
Important Message  Patient Details  Name: Cindy Padilla MRN: [id number] Date of Birth: [birth year]   Medicare Important Message Given:  Yes    Caren Macadam 03/03/2017, 10:30 AMImportant Message  Patient Details  Name: Cindy Padilla MRN: [id number] Date of Birth: [birth year]   Medicare Important Message Given:  Yes    Caren Macadam 03/03/2017, 10:30 AM

## 2017-03-03 NOTE — Progress Notes (Signed)
  Speech Language Pathology Treatment: Dysphagia  Patient Details Name: Cindy Padilla MRN: [id number] DOB: 30-Mar-1941 Today's Date: 03/03/2017 Time: 4098-1191 SLP Time Calculation (min) (ACUTE ONLY): 26 min  Assessment / Plan / Recommendation Clinical Impression  Pt heard coughing in room as SLP reviewing chart and as SLP discussing current swallow status with pt. She continued to produce a dry/wheezy cough throughout session with cup and straw sips thin and solid cracker. Normal exhalation pattern present post swallow; no significant dyspnea present. Per review of imaging pt had pna 05/2016, no pneumonia diagnoses in 2017 or 2016. She endorses GERD and "takes medicine for it". Recommend continue regular texture/thin, educated pt re: esophageal and basic aspiration precautions and follow up once more. Pt would either benefit from MBS this admission to evaluate timing/coordination of respiration and swallow or if admitted in near future with pneumonia.    HPI HPI: Patient is a 76 y.o. female with a PMH: of hypertension, diabetes, dementia, COPD, bipolar disorder, presented with shortness of breath. She was noted to have COPD exacerbation.       SLP Plan  Continue with current plan of care       Recommendations  Diet recommendations: Regular;Thin liquid Liquids provided via: Cup;Straw Medication Administration: Whole meds with liquid Supervision: Patient able to self feed Compensations: Slow rate;Small sips/bites;Minimize environmental distractions Postural Changes and/or Swallow Maneuvers: Seated upright 90 degrees;Upright 30-60 min after meal                Oral Care Recommendations: Oral care BID Follow up Recommendations:  (TBD) SLP Visit Diagnosis: Dysphagia, unspecified (R13.10) Plan: Continue with current plan of care                      Cindy Padilla 03/03/2017, 10:58 AM  Breck Coons Lonell [employer] [phone]

## 2017-03-03 NOTE — Progress Notes (Signed)
Occupational Therapy Treatment Patient Details Name: Cindy Padilla MRN: [id number] DOB: 1941-04-23 Today's Date: 03/03/2017    History of present illness  Cindy Padilla is a 76 y.o. female with medical history significant of HTN; DM; dementia; COPD not on home O2; and bipolar d/o presenting with SOB.    OT comments  Pt agreeable to mobility in the room . Pt seems to be improving - but wheezing present. RN aware  Follow Up Recommendations  Home health OT    Equipment Recommendations  None recommended by OT    Recommendations for Other Services      Precautions / Restrictions Precautions Precautions: Fall       Mobility Bed Mobility Overal bed mobility: Modified Independent                Transfers Overall transfer level: Needs assistance Equipment used: Rolling walker (2 wheeled) Transfers: Sit to/from Stand Sit to Stand: Supervision Stand pivot transfers: Supervision       General transfer comment: VC for hand placement     Balance Overall balance assessment: Needs assistance Sitting-balance support: No upper extremity supported Sitting balance-Leahy Scale: Good     Standing balance support: No upper extremity supported Standing balance-Leahy Scale: Good                             ADL either performed or assessed with clinical judgement   ADL Overall ADL's : Needs assistance/impaired     Grooming: Supervision/safety;Standing;Oral care;Wash/dry face                   Toilet Transfer: RW;Ambulation;Comfort height toilet;Minimal assistance   Toileting- Clothing Manipulation and Hygiene: Supervision/safety;Cueing for safety;Cueing for sequencing       Functional mobility during ADLs: Min guard;Rolling walker       Vision Baseline Vision/History: No visual deficits            Cognition Arousal/Alertness: Awake/alert Behavior During Therapy: Anxious;WFL for tasks assessed/performed Overall Cognitive  Status: Within Functional Limits for tasks assessed                                                     Pertinent Vitals/ Pain       Pain Assessment: No/denies pain Faces Pain Scale: No hurt         Frequency  Min 2X/week        Progress Toward Goals  OT Goals(current goals can now be found in the care plan section)  Progress towards OT goals: Progressing toward goals     Plan Discharge plan remains appropriate       AM-PAC PT "6 Clicks" Daily Activity     Outcome Measure   Help from another person eating meals?: None Help from another person taking care of personal grooming?: A Little Help from another person toileting, which includes using toliet, bedpan, or urinal?: A Little Help from another person bathing (including washing, rinsing, drying)?: A Little Help from another person to put on and taking off regular upper body clothing?: None Help from another person to put on and taking off regular lower body clothing?: A Little 6 Click Score: 20    End of Session Equipment Utilized During Treatment: Rolling walker  OT Visit Diagnosis: Unsteadiness on feet (R26.81);Muscle weakness (generalized) (M62.81);History  of falling (Z91.81)   Activity Tolerance Patient tolerated treatment well   Patient Left in chair;with call bell/phone within reach   Nurse Communication Mobility status        Time: 1610-9604 OT Time Calculation (min): 15 min  Charges: OT General Charges $OT Visit: 1 Visit OT Treatments $Self Care/Home Management : 8-22 mins  [place], [place] [phone]   [name] 03/03/2017, 12:27 PM

## 2017-03-04 LAB — GLUCOSE, CAPILLARY
GLUCOSE-CAPILLARY: 99 mg/dL (ref 65–99)
GLUCOSE-CAPILLARY: 133 mg/dL — AB (ref 65–99)

## 2017-03-04 MED ORDER — BENZONATATE 100 MG PO CAPS: 100.0000 mg | ORAL_CAPSULE | Freq: Three times a day (TID) | ORAL | 0 refills | Status: DC

## 2017-03-04 MED ORDER — IPRATROPIUM-ALBUTEROL 0.5-2.5 (3) MG/3ML IN SOLN: 3.0000 mL | Freq: Four times a day (QID) | RESPIRATORY_TRACT | 1 refills | Status: DC

## 2017-03-04 MED ORDER — LEVOFLOXACIN 750 MG PO TABS: 750.0000 mg | ORAL_TABLET | Freq: Every day | ORAL | 0 refills | Status: DC

## 2017-03-04 MED ORDER — PREDNISONE 20 MG PO TABS: ORAL_TABLET | ORAL | 0 refills | Status: DC

## 2017-03-04 MED ORDER — GUAIFENESIN-DM 100-10 MG/5ML PO SYRP: 5.0000 mL | ORAL_SOLUTION | ORAL | 0 refills | Status: DC | PRN

## 2017-03-04 MED ORDER — LORAZEPAM 1 MG PO TABS: 1.0000 mg | ORAL_TABLET | Freq: Two times a day (BID) | ORAL | 0 refills | Status: DC

## 2017-03-04 NOTE — Progress Notes (Addendum)
LCSW following for disposition of needs:  Return to [facility]  [facility], RN regarding patient return to [facility] Call also placed to facility to update regarding patient return.  FL2 updated and discharge paperwork has been sent to facility. LCSW spoke with Tammy with regards to review of FL2 and paperwork. Awaiting return call to confirm all paperwork is correct. RN has been notified that LCSW is actively working on discharge.  Will establish mode of transportation as well once call returned and update family.  2:06 PM Call placed again to facility and message left for review of paperwork in effort for patient to return.  Message left for assistance in having patient return.  A few minutes after call completed, marketing representative Hallie has called and notified that she will be following up.    Cindy Padilla, MSW Clinical Social Work: Optician, dispensing Coverage for :  [phone]

## 2017-03-04 NOTE — Discharge Summary (Signed)
[facility]  Physician Discharge Summary   Patient ID: Cindy Padilla MRN: [id number] DOB/AGE: August 07, 1940 76 y.o.  Admit date: 02/24/2017 Discharge date: 03/04/2017  PCP: Ron Parker, MD  DISCHARGE DIAGNOSES:  Principal Problem:   COPD with acute exacerbation (HCC) Active Problems:   HTN (hypertension)   Dementia with behavioral disturbance   Bipolar 1 disorder (HCC)   Normocytic anemia   Acute exacerbation of chronic obstructive pulmonary disease (COPD) (HCC)   RECOMMENDATIONS FOR OUTPATIENT FOLLOW UP: 1. Please note that the frequency of her nebulizer treatment has been increased to 4 times a day. 2. Patient has severe upper airway cough syndrome, which will take time to improve. 3. Alternative anti-tussive agents can be considered if no improvement with Tessalon.  DISCHARGE CONDITION: fair  Diet recommendation: As before  Filed Weights   02/24/17 1520  Weight: 88.5 kg (195 lb)    INITIAL HISTORY: 76 year old Caucasian female with a past medical history of hypertension, diabetes, dementia, COPD, bipolar disorder, presented with shortness of breath. She was noted have COPD exacerbation. She was hospitalized for further management.    HOSPITAL COURSE:   Acute COPD exacerbation/Pneumonia, possibly aspiration/upper airway cough syndrome Patient was very slow to improve. She started off with COPD exacerbation, but it appears that most of her current symptoms are upper airway in origin. Her lungs are mostly clear to auscultation. She appears to be on maximal treatment.  Continue antibiotics, nebulizer treatments, steroids. Taper steroids. Continue antitussive agents, Tessalon. She is already on a nasal spray. She is also on Claritin and leukotriene inhibitor. She did experience some relief with nebulized lidocaine. Chest x-ray done did raise concern for pneumonia but clinically she does not appear to have worsening infection. Continue Levaquin for more more  days. Speech therapy has seen the patient and she does not have any overt signs of aspiration. She will need home health at the time of discharge. She is on maximal therapy for upper airway cough syndrome. Gabapentin can be considered. However, she was not started on the same as she is on multiple other psychotropic agents. Tussionex could also be considered if Tessalon does not help. She will continue to have dry cough and upper airway wheezing for the next many days. She is saturating normal on room air. May also consider outpatient pulmonology consultation.  Hyperglycemia Elevated blood glucose level is due to steroids. CBGs have improved with tapering doses of steroids. Continue to monitor periodically.  History of essential hypertension Continue home medications. Elevated blood pressure, most likely due to steroids.  History of dementia, bipolar disorder Stable. Continue home medications.  Chronic lower extremity wound Patient was receiving home health for the same. Home health can be resumed. Seen by wound care nurse here. Dressing changes were made. Una boots were applied. These can be reassessed by her home health nurse.  Normocytic anemia. Hemoglobin is stable. No evidence for bleeding.   Hyponatremia. This is mild. Etiology unclear. Monitor periodically.  Overall she is stable. Discussed in detail with patient, although unclear how much she understoodof what I was explaining due to her cognitive impairment. Explained to her daughter as well. Okay for discharge today to her assisted living facility.   PERTINENT LABS:  The results of significant diagnostics from this hospitalization (including imaging, microbiology, ancillary and laboratory) are listed below for reference.    Microbiology: Recent Results (from the past 240 hour(s))  MRSA PCR Screening     Status: None   Collection Time: 02/25/17  4:18 AM  Result Value Ref Range Status   MRSA by PCR NEGATIVE NEGATIVE  Final    Comment:        The GeneXpert MRSA Assay (FDA approved for NASAL specimens only), is one component of a comprehensive MRSA colonization surveillance program. It is not intended to diagnose MRSA infection nor to guide or monitor treatment for MRSA infections.      Labs: Basic Metabolic Panel:  Recent Labs Lab 02/26/17 0603 02/28/17 0528 03/03/17 0543  NA 131* 131* 131*  K 4.5 4.7 4.5  CL 96* 93* 90*  CO2 GLUCOSE 172* 218* 203*  BUN CREATININE 0.76 0.83 0.83  CALCIUM 9.1 8.9 8.9   CBC:  Recent Labs Lab 02/26/17 0603 02/28/17 0528 03/03/17 0543  WBC 13.8* 10.4 15.3*  HGB 10.3* 10.9* 12.0  HCT 30.3* 33.0* 36.6  MCV 86.6 89.4 88.4  PLT 212 242 274   BNP: BNP (last 3 results)  Recent Labs  06/10/16 2138 02/24/17 1551  BNP 70.1 72.6    CBG:  Recent Labs Lab 03/02/17 2041 03/03/17 0745 03/03/17 1147 03/03/17 1725 03/04/17 0738  GLUCAP 212* 172* 225* 163* 99     IMAGING STUDIES Dg Chest 2 View  Result Date: 03/01/2017 CLINICAL DATA:  Cough and chest congestion. EXAM: CHEST  2 VIEW COMPARISON:  02/24/2017 FINDINGS: Chronic left ventricular prominence. Chronic aortic atherosclerosis. There is mild patchy density in the right middle lobe or lingula consistent with mild pneumonia. The remainder the chest is clear. No effusions. Left-sided neurostimulator as seen previously. No significant bone finding. IMPRESSION: Patchy density best seen on the lateral consistent with mild pneumonia in the right middle lobe or lingula. Electronically Signed   By: Paulina Fusi M.D.   On: 03/01/2017 10:48   Dg Chest 2 View  Result Date: 02/24/2017 CLINICAL DATA:  Patient with cough and shortness of breath. EXAM: CHEST  2 VIEW COMPARISON:  Chest radiograph 06/13/2016 FINDINGS: Multiple monitoring leads overlie the patient. Stimulator pack projects over the left hemithorax. Stable cardiac and mediastinal contours. Elevation right hemidiaphragm. No  consolidative pulmonary opacities. No pleural effusion or pneumothorax. Thoracic spine degenerative changes. IMPRESSION: No acute cardiopulmonary process. Electronically Signed   By: Annia Belt M.D.   On: 02/24/2017 16:58    DISCHARGE EXAMINATION: Vitals:   03/03/17 2058 03/03/17 2344 03/04/17 0502 03/04/17 0848  BP:  (!) 142/69 138/72   Pulse:  88 83   Resp:  20 20   Temp:  97.6 F (36.4 C) 99 F (37.2 C)   TempSrc:  Oral Axillary   SpO2: 95% 96% 94% 95%  Weight:      Height:       General appearance: alert, cooperative, appears stated age and no distress Resp: Few scattered wheezes. Mostly clear to auscultation. She does have upper airway wheezing. No stridor. Cardio: regular rate and rhythm, S1, S2 normal, no murmur, click, rub or gallop GI: soft, non-tender; bowel sounds normal; no masses,  no organomegaly Extremities: extremities normal, atraumatic, no cyanosis or edema   DISPOSITION: Assisted living facility  Discharge Instructions    Call MD for:  difficulty breathing, headache or visual disturbances    Complete by:  As directed    Call MD for:  extreme fatigue    Complete by:  As directed    Call MD for:  persistant dizziness or light-headedness    Complete by:  As directed    Call MD for:  persistant nausea and vomiting  Complete by:  As directed    Call MD for:  severe uncontrolled pain    Complete by:  As directed    Call MD for:  temperature >100.4    Complete by:  As directed    Diet - low sodium heart healthy    Complete by:  As directed    Discharge instructions    Complete by:  As directed    Please review instructions on the discharge summary.  You were cared for by a hospitalist during your hospital stay. If you have any questions about your discharge medications or the care you received while you were in the hospital after you are discharged, you can call the unit and asked to speak with the hospitalist on call if the hospitalist that took care of  you is not available. Once you are discharged, your primary care physician will handle any further medical issues. Please note that NO REFILLS for any discharge medications will be authorized once you are discharged, as it is imperative that you return to your primary care physician (or establish a relationship with a primary care physician if you do not have one) for your aftercare needs so that they can reassess your need for medications and monitor your lab values. If you do not have a primary care physician, you can call [phone] for a physician referral.   Increase activity slowly    Complete by:  As directed       ALLERGIES:  Allergies  Allergen Reactions  . Aminoglycosides Other (See Comments)    Reaction:  Unknown   . Aspirin Other (See Comments)    Reaction:  Unknown   . Codeine Other (See Comments)    Reaction:  Unknown   . Ivp Dye [Iodinated Diagnostic Agents] Other (See Comments)    Reaction:  Unknown   . Morphine And Related Other (See Comments)    Reaction:  Unknown   . Neomycin Other (See Comments)    Reaction:  Unknown   . Penicillins Rash and Other (See Comments)    Has patient had a PCN reaction causing immediate rash, facial/tongue/throat swelling, SOB or lightheadedness with hypotension: No Has patient had a PCN reaction causing severe rash involving mucus membranes or skin necrosis: No Has patient had a PCN reaction that required hospitalization No Has patient had a PCN reaction occurring within the last 10 years: No If all of the above answers are "NO", then may proceed with Cephalosporin use.  Marland Kitchen Phenothiazines Other (See Comments)    Reaction:  Unknown   . Promethazine Other (See Comments)    Reaction:  Unknown   . Sulfa Antibiotics Other (See Comments)    Reaction:  Unknown   . Tetracyclines & Related Other (See Comments)    Reaction: Unknown      Current Discharge Medication List    START taking these medications   Details  benzonatate (TESSALON) 100  MG capsule Take 1 capsule (100 mg total) by mouth 3 (three) times daily. Qty: 60 capsule, Refills: 0    guaiFENesin-dextromethorphan (ROBITUSSIN DM) 100-10 MG/5ML syrup Take 5 mLs by mouth every 4 (four) hours as needed for cough. Qty: 118 mL, Refills: 0    levofloxacin (LEVAQUIN) 750 MG tablet Take 1 tablet (750 mg total) by mouth daily. Qty: 4 tablet, Refills: 0    predniSONE (DELTASONE) 20 MG tablet Take 2 tablets once daily for 4 days, then take 1 tablet once daily for 4 days, then STOP. Qty: 12 tablet, Refills:  0      CONTINUE these medications which have CHANGED   Details  ipratropium-albuterol (DUONEB) 0.5-2.5 (3) MG/3ML SOLN Take 3 mLs by nebulization 4 (four) times daily. Qty: 360 mL, Refills: 1    LORazepam (ATIVAN) 1 MG tablet Take 1 tablet (1 mg total) by mouth every 12 (twelve) hours. Qty: 20 tablet, Refills: 0      CONTINUE these medications which have NOT CHANGED   Details  acetaminophen (TYLENOL) 325 MG tablet Take 650 mg by mouth 3 (three) times daily.    albuterol (PROVENTIL HFA;VENTOLIN HFA) 108 (90 BASE) MCG/ACT inhaler Inhale 2 puffs into the lungs every 4 (four) hours as needed for wheezing or shortness of breath.    alum & mag hydroxide-simeth (GERI-LANTA) 200-200-20 MG/5ML suspension Take 30 mLs by mouth 4 (four) times daily as needed for indigestion or heartburn.     amLODipine (NORVASC) 5 MG tablet Take 5 mg by mouth 2 (two) times daily.     antiseptic oral rinse (BIOTENE) LIQD 15 mLs by Mouth Rinse route at bedtime. Swish and spit    atorvastatin (LIPITOR) 40 MG tablet Take 40 mg by mouth daily.     budesonide-formoterol (SYMBICORT) 160-4.5 MCG/ACT inhaler Inhale 2 puffs into the lungs 2 (two) times daily.     cholecalciferol (VITAMIN D) 1000 UNITS tablet Take 1,000 Units by mouth daily.     clopidogrel (PLAVIX) 75 MG tablet Take 75 mg by mouth daily.     dicyclomine (BENTYL) 20 MG tablet Take 1 tablet (20 mg total) by mouth 2 (two) times  daily. Qty: 20 tablet, Refills: 0    donepezil (ARICEPT) 10 MG tablet Take 10 mg by mouth at bedtime.     esomeprazole (NEXIUM) 40 MG capsule Take 40 mg by mouth daily before breakfast.    ferrous sulfate 325 (65 FE) MG tablet Take 325 mg by mouth daily with breakfast.    fexofenadine (ALLEGRA) 180 MG tablet Take 180 mg by mouth daily.    fluticasone (FLONASE) 50 MCG/ACT nasal spray Place 2 sprays into both nostrils daily.     furosemide (LASIX) 20 MG tablet Take 20 mg by mouth daily with breakfast.    lamoTRIgine (LAMICTAL) 200 MG tablet Take 200 mg by mouth 2 (two) times daily.    lidocaine (ASPERCREME W/LIDOCAINE) 4 % cream Apply 1 application topically 3 (three) times daily as needed (for pain).     loperamide (IMODIUM) 2 MG capsule Take 2 mg by mouth every 3 (three) hours as needed for diarrhea or loose stools.     magnesium hydroxide (MILK OF MAGNESIA) 400 MG/5ML suspension Take 30 mLs by mouth at bedtime as needed for mild constipation.    magnesium oxide (MAG-OX) 400 (241.3 Mg) MG tablet Take 400 mg by mouth daily.    miconazole (BAZA ANTIFUNGAL) 2 % cream Apply 1 application topically daily.    montelukast (SINGULAIR) 10 MG tablet Take 10 mg by mouth at bedtime.    phenylephrine-shark liver oil-mineral oil-petrolatum (PREPARATION H) 0.25-3-14-71.9 % rectal ointment Place 1 application rectally 2 (two) times daily as needed for hemorrhoids.    QUEtiapine (SEROQUEL) 100 MG tablet Take 150 mg by mouth 2 (two) times daily.     ranitidine (ZANTAC) 150 MG tablet Take 150 mg by mouth at bedtime.     senna-docusate (SENOKOT-S) 8.6-50 MG per tablet Take 1 tablet by mouth 2 (two) times daily.    triamcinolone cream (KENALOG) 0.1 % Apply 1 application topically daily as needed (for irritation).  valsartan (DIOVAN) 320 MG tablet Take 1 tablet (320 mg total) by mouth daily. Qty: 30 tablet, Refills: 11    Vitamins A & D (VITAMIN A & D) ointment Apply 1 application topically  every 6 (six) hours as needed (for rectal pain).     guaiFENesin (MUCINEX) 600 MG 12 hr tablet Take 1 tablet (600 mg total) by mouth 2 (two) times daily.        Follow-up Information    Ron Parker, MD. Schedule an appointment as soon as possible for a visit in 1 week(s).   Specialty:  Internal Medicine Contact information: [address] [place] [address] [phone]           TOTAL DISCHARGE TIME: 35 minutes  [facility]  [facility] Pager [phone]  03/04/2017, 10:05 AM

## 2017-03-04 NOTE — Progress Notes (Signed)
Orthopedic Tech Progress Note Patient Details:  Cindy Padilla 04-10-1941 [id number]  Ortho Devices Type of Ortho Device: Roland Rack boot Ortho Device/Splint Location: Bilateral unna boots Ortho Device/Splint Interventions: Application   Saul Fordyce 03/04/2017, 10:18 AM

## 2017-03-04 NOTE — Progress Notes (Signed)
Report given to Crystal at [facility]. Patient transferred via [facility].

## 2017-03-30 ENCOUNTER — Emergency Department (HOSPITAL_COMMUNITY)
Admission: EM | Admit: 2017-03-30 | Discharge: 2017-03-30 | Disposition: A | Discharge status: Skilled Nursing Facility | Payer: Medicare Other | Attending: Emergency Medicine | Admitting: Emergency Medicine

## 2017-03-30 ENCOUNTER — Emergency Department (HOSPITAL_COMMUNITY): Payer: Medicare Other

## 2017-03-30 ENCOUNTER — Encounter (HOSPITAL_COMMUNITY): Payer: Self-pay

## 2017-03-30 ENCOUNTER — Other Ambulatory Visit: Payer: Self-pay

## 2017-03-30 DIAGNOSIS — F17210 Nicotine dependence, cigarettes, uncomplicated: Secondary | ICD-10-CM | POA: Insufficient documentation

## 2017-03-30 DIAGNOSIS — R42 Dizziness and giddiness: Secondary | ICD-10-CM | POA: Insufficient documentation

## 2017-03-30 DIAGNOSIS — E119 Type 2 diabetes mellitus without complications: Secondary | ICD-10-CM | POA: Diagnosis not present

## 2017-03-30 DIAGNOSIS — I10 Essential (primary) hypertension: Secondary | ICD-10-CM | POA: Insufficient documentation

## 2017-03-30 DIAGNOSIS — F0390 Unspecified dementia without behavioral disturbance: Secondary | ICD-10-CM | POA: Insufficient documentation

## 2017-03-30 DIAGNOSIS — J449 Chronic obstructive pulmonary disease, unspecified: Secondary | ICD-10-CM | POA: Insufficient documentation

## 2017-03-30 DIAGNOSIS — Z79899 Other long term (current) drug therapy: Secondary | ICD-10-CM | POA: Diagnosis not present

## 2017-03-30 LAB — CBC
HEMATOCRIT: 32.8 % — AB (ref 36.0–46.0)
HEMOGLOBIN: 10.8 g/dL — AB (ref 12.0–15.0)
MCH: 29.7 pg (ref 26.0–34.0)
MCHC: 32.9 g/dL (ref 30.0–36.0)
MCV: 90.1 fL (ref 78.0–100.0)
Platelets: 210 10*3/uL (ref 150–400)
RBC: 3.64 MIL/uL — ABNORMAL LOW (ref 3.87–5.11)
RDW: 16 % — AB (ref 11.5–15.5)
WBC: 6.4 10*3/uL (ref 4.0–10.5)

## 2017-03-30 LAB — URINALYSIS, ROUTINE W REFLEX MICROSCOPIC
Bilirubin Urine: NEGATIVE
GLUCOSE, UA: NEGATIVE mg/dL
Hgb urine dipstick: NEGATIVE
KETONES UR: NEGATIVE mg/dL
LEUKOCYTES UA: NEGATIVE
NITRITE: NEGATIVE
PROTEIN: NEGATIVE mg/dL
Specific Gravity, Urine: 1.003 — ABNORMAL LOW (ref 1.005–1.030)
pH: 7 (ref 5.0–8.0)

## 2017-03-30 LAB — COMPREHENSIVE METABOLIC PANEL
ALBUMIN: 3.9 g/dL (ref 3.5–5.0)
ALK PHOS: 99 U/L (ref 38–126)
ALT: 20 U/L (ref 14–54)
ANION GAP: 9 (ref 5–15)
AST: 24 U/L (ref 15–41)
BILIRUBIN TOTAL: 0.7 mg/dL (ref 0.3–1.2)
BUN: 8 mg/dL (ref 6–20)
CALCIUM: 9.3 mg/dL (ref 8.9–10.3)
CO2: 29 mmol/L (ref 22–32)
Chloride: 100 mmol/L — ABNORMAL LOW (ref 101–111)
Creatinine, Ser: 0.79 mg/dL (ref 0.44–1.00)
GFR calc non Af Amer: >60 mL/min (ref >60)
Glucose, Bld: 104 mg/dL — ABNORMAL HIGH (ref 65–99)
POTASSIUM: 4 mmol/L (ref 3.5–5.1)
Sodium: 138 mmol/L (ref 135–145)
TOTAL PROTEIN: 6.9 g/dL (ref 6.5–8.1)

## 2017-03-30 MED ORDER — IPRATROPIUM BROMIDE 0.02 % IN SOLN
0.5000 mg | Freq: Once | RESPIRATORY_TRACT | Status: AC
  Administered 2017-03-30: 0.5 mg via RESPIRATORY_TRACT
  Filled 2017-03-30: qty 2.5

## 2017-03-30 MED ORDER — ALBUTEROL SULFATE (2.5 MG/3ML) 0.083% IN NEBU
5.0000 mg | INHALATION_SOLUTION | Freq: Once | RESPIRATORY_TRACT | Status: AC
  Administered 2017-03-30: 5 mg via RESPIRATORY_TRACT
  Filled 2017-03-30: qty 6

## 2017-03-30 NOTE — ED Triage Notes (Signed)
Pt c/o dizziness, headache, and problems walking X 2days  BP 157/74 HR 86 Resp18 SA02 96 RA  20ga LAC  [facility]

## 2017-03-30 NOTE — ED Provider Notes (Signed)
[facility]-EMERGENCY DEPT Provider Note   CSN: [id number] Arrival date & time: 03/30/17  1750     History   Chief Complaint Chief Complaint  Patient presents with  . Dizziness    HPI Cindy Padilla is a 76 y.o. female.  Patient c/o feeling dizzy, and feeling funny in her head. Symptoms present for past couple days. Gradual onset. Persistent. Denies specific exacerbating or alleviating factors. States if stands fast feels lightheaded as if faint. Denies room spinning or vertigo sensation. Also c/o dull head pain, gradual onset, mild. Pt is difficult historian. Denies change in speech or vision. No extremity numbness/weakness or loss of normal fxn. Walks w walker. Denies fall//trauma. No syncope. Denies fever or chills. Normal appetite. No nvd. No gu c/o. Denies chest pain or discomfort. +non prod cough, and occasional wheezing. Hx same. Denies change in meds or new meds.    The history is provided by the patient.  Dizziness  Associated symptoms: no blood in stool, no chest pain, no diarrhea, no headaches, no shortness of breath, no tinnitus, no vomiting and no weakness     Past Medical History:  Diagnosis Date  . Arthritis   . Bipolar 1 disorder (HCC)   . Bronchitis   . Chronic back pain   . Constipation   . COPD (chronic obstructive pulmonary disease) (HCC)   . Dementia   . Diabetes mellitus   . GERD (gastroesophageal reflux disease)   . Hiatal hernia   . Hypertension   . IBS (irritable bowel syndrome)     Patient Active Problem List   Diagnosis Date Noted  . Acute exacerbation of chronic obstructive pulmonary disease (COPD) (HCC) 02/26/2017  . COPD with acute exacerbation (HCC) 02/24/2017  . Bipolar 1 disorder (HCC) 02/24/2017  . Normocytic anemia 02/24/2017  . Streptococcus pneumoniae pneumonia (HCC) 06/15/2016  . Sepsis (HCC) 06/10/2016  . Morbid obesity (HCC) 04/14/2015  . Upper airway cough syndrome 04/14/2015  . Ventral hernia  04/24/2014  . Constipation 04/24/2014  . Abdominal pain, epigastric 04/24/2014  . Partial small bowel obstruction (HCC) 04/22/2014  . Hemorrhoids 04/22/2014  . Abnormality of gait 09/30/2013  . 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 Diastolic heart failure (HCC) 07/27/2013  . Dementia with behavioral disturbance 07/27/2013  . HCAP (healthcare-associated pneumonia) 07/24/2013  . Acute respiratory failure (HCC) 02/21/2013  . Weakness 02/09/2013  . Hypokalemia 02/09/2013  . Hyponatremia 02/09/2013  . Shortness of breath 02/09/2013  . HTN (hypertension) 02/09/2013  . Other and unspecified hyperlipidemia 02/09/2013    Past Surgical History:  Procedure Laterality Date  . gallbladder    . MANDIBLE SURGERY    . PARTIAL HYSTERECTOMY      OB History    No data available       Home Medications    Prior to Admission medications   Medication Sig Start Date End Date Taking? Authorizing Provider  acetaminophen (TYLENOL) 325 MG tablet Take 650 mg by mouth 3 (three) times daily.    [provider]  albuterol (PROVENTIL HFA;VENTOLIN HFA) 108 (90 BASE) MCG/ACT inhaler Inhale 2 puffs into the lungs every 4 (four) hours as needed for wheezing or shortness of breath.    [provider]  alum & mag hydroxide-simeth (GERI-LANTA) 200-200-20 MG/5ML suspension Take 30 mLs by mouth 4 (four) times daily as needed for indigestion or heartburn.     [provider]  amLODipine (NORVASC) 5 MG tablet Take 5 mg by mouth 2 (two) times  daily.     [provider]  antiseptic oral rinse (BIOTENE) LIQD 15 mLs by Mouth Rinse route at bedtime. Swish and spit    [provider]  atorvastatin (LIPITOR) 40 MG tablet Take 40 mg by mouth daily.     [provider]  benzonatate (TESSALON) 100 MG capsule Take 1 capsule (100 mg total) by mouth 3 (three) times daily. 03/04/17   Osvaldo ShipperKrishnan, Gokul, MD  budesonide-formoterol [facility](SYMBICORT) 160-4.5  MCG/ACT inhaler Inhale 2 puffs into the lungs 2 (two) times daily.     [provider]  cholecalciferol (VITAMIN D) 1000 UNITS tablet Take 1,000 Units by mouth daily.     [provider]  clopidogrel (PLAVIX) 75 MG tablet Take 75 mg by mouth daily.     [provider]  dicyclomine (BENTYL) 20 MG tablet Take 1 tablet (20 mg total) by mouth 2 (two) times daily. 10/31/16   Alvira MondaySchlossman, Erin, MD  donepezil (ARICEPT) 10 MG tablet Take 10 mg by mouth at bedtime.     [provider]  esomeprazole (NEXIUM) 40 MG capsule Take 40 mg by mouth daily before breakfast.    [provider]  ferrous sulfate 325 (65 FE) MG tablet Take 325 mg by mouth daily with breakfast.    [provider]  fexofenadine (ALLEGRA) 180 MG tablet Take 180 mg by mouth daily.    [provider]  fluticasone (FLONASE) 50 MCG/ACT nasal spray Place 2 sprays into both nostrils daily.     [provider]  furosemide (LASIX) 20 MG tablet Take 20 mg by mouth daily with breakfast.    [provider]  guaiFENesin (MUCINEX) 600 MG 12 hr tablet Take 1 tablet (600 mg total) by mouth 2 (two) times daily. Patient not taking: Reported on 02/24/2017 06/15/16   Calvert Cantorizwan, Saima, MD  guaiFENesin-dextromethorphan (ROBITUSSIN DM) 100-10 MG/5ML syrup Take 5 mLs by mouth every 4 (four) hours as needed for cough. 03/04/17   Osvaldo ShipperKrishnan, Gokul, MD  ipratropium-albuterol (DUONEB) 0.5-2.5 (3) MG/3ML SOLN Take 3 mLs by nebulization 4 (four) times daily. 03/04/17   Osvaldo ShipperKrishnan, Gokul, MD  lamoTRIgine (LAMICTAL) 200 MG tablet Take 200 mg by mouth 2 (two) times daily.    [provider]  levofloxacin (LEVAQUIN) 750 MG tablet Take 1 tablet (750 mg total) by mouth daily. 03/05/17   Osvaldo ShipperKrishnan, Gokul, MD  lidocaine (ASPERCREME W/LIDOCAINE) 4 % cream Apply 1 application topically 3 (three) times daily as needed (for pain).     [provider]  loperamide (IMODIUM) 2 MG capsule Take 2 mg by  mouth every 3 (three) hours as needed for diarrhea or loose stools.     [provider]  LORazepam (ATIVAN) 1 MG tablet Take 1 tablet (1 mg total) by mouth every 12 (twelve) hours. 03/04/17   Osvaldo ShipperKrishnan, Gokul, MD  magnesium hydroxide (MILK OF MAGNESIA) 400 MG/5ML suspension Take 30 mLs by mouth at bedtime as needed for mild constipation.    [provider]  magnesium oxide (MAG-OX) 400 (241.3 Mg) MG tablet Take 400 mg by mouth daily.    [provider]  miconazole (BAZA ANTIFUNGAL) 2 % cream Apply 1 application topically daily.    [provider]  montelukast (SINGULAIR) 10 MG tablet Take 10 mg by mouth at bedtime.    [provider]  phenylephrine-shark liver oil-mineral oil-petrolatum (PREPARATION H) 0.25-3-14-71.9 % rectal ointment Place 1 application rectally 2 (two) times daily as needed for hemorrhoids.    [provider]  predniSONE (  DELTASONE) 20 MG tablet Take 2 tablets once daily for 4 days, then take 1 tablet once daily for 4 days, then STOP. 03/04/17   [name], MD  QUEtiapine (SEROQUEL) 100 MG tablet Take 150 mg by mouth 2 (two) times daily.     [provider]  ranitidine (ZANTAC) 150 MG tablet Take 150 mg by mouth at bedtime.     [provider]  senna-docusate (SENOKOT-S) 8.6-50 MG per tablet Take 1 tablet by mouth 2 (two) times daily.    [provider]  triamcinolone cream (KENALOG) 0.1 % Apply 1 application topically daily as needed (for irritation).     [provider]  valsartan (DIOVAN) 320 MG tablet Take 1 tablet (320 mg total) by mouth daily. 04/16/13   Nyoka Cowden, MD  Vitamins A & D (VITAMIN A & D) ointment Apply 1 application topically every 6 (six) hours as needed (for rectal pain).     [provider]    Family History Family History  Problem Relation Age of Onset  . Emphysema Mother   . Cancer Other     Social History Social History   Tobacco Use  .  Smoking status: Former Smoker    Packs/day: 0.50    Years: 2.00    Pack years: 1.00    Types: Cigarettes    Last attempt to quit: 05/28/1959    Years since quitting: 57.8  . Smokeless tobacco: Former Neurosurgeon    Types: Snuff    Quit date: 05/28/1959  . Tobacco comment: Pt used snuff for 33 years  Substance Use Topics  . Alcohol use: No  . Drug use: No     Allergies   Aminoglycosides; Aspirin; Codeine; Ivp dye [iodinated diagnostic agents]; Morphine and related; Neomycin; Penicillins; Phenothiazines; Promethazine; Sulfa antibiotics; and Tetracyclines & related   Review of Systems Review of Systems  Constitutional: Negative for chills and fever.  HENT: Negative for tinnitus.   Eyes: Negative for visual disturbance.  Respiratory: Positive for cough and wheezing. Negative for shortness of breath.   Cardiovascular: Negative for chest pain and leg swelling.  Gastrointestinal: Negative for abdominal pain, blood in stool, diarrhea and vomiting.  Genitourinary: Negative for dysuria and flank pain.  Musculoskeletal: Negative for back pain and neck pain.  Skin: Negative for rash.  Neurological: Positive for dizziness and light-headedness. Negative for weakness, numbness and headaches.  Hematological: Does not bruise/bleed easily.  Psychiatric/Behavioral: Negative for confusion.     Physical Exam Updated Vital Signs BP 131/69 (BP Location: Right Arm)   Pulse 84   Temp 98.1 F (36.7 C) (Oral)   Resp 16   Ht 1.524 m (5')   Wt 81.6 kg (180 lb)   SpO2 95%   BMI 35.15 kg/m   Physical Exam  Constitutional: She is oriented to person, place, and time. She appears well-developed and well-nourished. No distress.  HENT:  Head: Atraumatic.  Mouth/Throat: Oropharynx is clear and moist.  Eyes: Conjunctivae and EOM are normal. Pupils are equal, round, and reactive to light. No scleral icterus.  Neck: Neck supple. No tracheal deviation present. No thyromegaly present.  No bruits.    Cardiovascular: Normal rate, regular rhythm, normal heart sounds and intact distal pulses. Exam reveals no gallop and no friction rub.  No murmur heard. Pulmonary/Chest: Effort normal. No respiratory distress. She has wheezes.  Abdominal: Soft. Normal appearance and bowel sounds are normal. She exhibits no distension. There is no tenderness.  Genitourinary:  Genitourinary Comments: No cva tenderness  Musculoskeletal: She exhibits no edema.  Neurological: She is alert and oriented to person, place, and time. No cranial nerve deficit.  Speech clear/fluent. Motor intact bil, stre 5/5. No pronator drift. sens grossly intact.   Skin: Skin is warm and dry. No rash noted. She is not diaphoretic.  Psychiatric: She has a normal mood and affect.  Nursing note and vitals reviewed.    ED Treatments / Results  Labs (all labs ordered are listed, but only abnormal results are displayed) Results for orders placed or performed during the hospital encounter of 03/30/17  CBC  Result Value Ref Range   WBC 6.4 4.0 - 10.5 K/uL   RBC 3.64 (L) 3.87 - 5.11 MIL/uL   Hemoglobin 10.8 (L) 12.0 - 15.0 g/dL   HCT 81.1 (L) 91.4 - 78.2 %   MCV 90.1 78.0 - 100.0 fL   MCH 29.7 26.0 - 34.0 pg   MCHC 32.9 30.0 - 36.0 g/dL   RDW 95.6 (H) 21.3 - 08.6 %   Platelets 210 150 - 400 K/uL  Comprehensive metabolic panel  Result Value Ref Range   Sodium 138 135 - 145 mmol/L   Potassium 4.0 3.5 - 5.1 mmol/L   Chloride 100 (L) 101 - 111 mmol/L   CO2 29 22 - 32 mmol/L   Glucose, Bld 104 (H) 65 - 99 mg/dL   BUN 8 6 - 20 mg/dL   Creatinine, Ser 5.78 0.44 - 1.00 mg/dL   Calcium 9.3 8.9 - 46.9 mg/dL   Total Protein 6.9 6.5 - 8.1 g/dL   Albumin 3.9 3.5 - 5.0 g/dL   AST 24 15 - 41 U/L   ALT 20 14 - 54 U/L   Alkaline Phosphatase 99 38 - 126 U/L   Total Bilirubin 0.7 0.3 - 1.2 mg/dL   GFR calc non Af Amer >60 >60 mL/min   GFR calc Af Amer >60 >60 mL/min   Anion gap 9 5 - 15  Urinalysis, Routine w reflex microscopic   Result Value Ref Range   Color, Urine COLORLESS (A) YELLOW   APPearance CLEAR CLEAR   Specific Gravity, Urine 1.003 (L) 1.005 - 1.030   pH 7.0 5.0 - 8.0   Glucose, UA NEGATIVE NEGATIVE mg/dL   Hgb urine dipstick NEGATIVE NEGATIVE   Bilirubin Urine NEGATIVE NEGATIVE   Ketones, ur NEGATIVE NEGATIVE mg/dL   Protein, ur NEGATIVE NEGATIVE mg/dL   Nitrite NEGATIVE NEGATIVE   Leukocytes, UA NEGATIVE NEGATIVE   Dg Chest 2 View  Result Date: 03/30/2017 CLINICAL DATA:  Pt c/o dizziness, headache, and problems walking X 2days; hx bronchitis; COPD, diabetic; HTN; ex smoker EXAM: CHEST  2 VIEW COMPARISON:  03/01/2017 FINDINGS: Cardiac silhouette is mildly enlarged. No mediastinal or hilar masses. No convincing adenopathy. Lungs are clear.  No pleural effusion or pneumothorax. Elevated right hemidiaphragm is stable. Skeletal structures are demineralized but grossly intact. IMPRESSION: No acute cardiopulmonary disease. Electronically Signed   By: Amie Portland M.D.   On: 03/30/2017 18:58   Dg Chest 2 View  Result Date: 03/01/2017 CLINICAL DATA:  Cough and chest congestion. EXAM: CHEST  2 VIEW COMPARISON:  02/24/2017 FINDINGS: Chronic left ventricular prominence. Chronic aortic atherosclerosis. There is mild patchy density in the right middle lobe or lingula consistent with mild pneumonia. The remainder the chest is clear. No effusions. Left-sided neurostimulator as seen previously. No significant bone finding. IMPRESSION: Patchy density best seen on the lateral consistent with mild pneumonia in the right middle lobe or lingula. Electronically Signed  By: Paulina Fusi M.D.   On: 03/01/2017 10:48   Ct Head Wo Contrast  Result Date: 03/30/2017 CLINICAL DATA:  Pt c/o dizziness, headache, and problems walking X 2days EXAM: CT HEAD WITHOUT CONTRAST TECHNIQUE: Contiguous axial images were obtained from the base of the skull through the vertex without intravenous contrast. COMPARISON:  07/22/2012 FINDINGS: Brain:  No evidence of acute infarction, hemorrhage, hydrocephalus, extra-axial collection or mass lesion/mass effect. There is ventricular sulcal enlargement reflecting mild generalized atrophy. Patchy white matter hypoattenuation is noted consistent with mild chronic microvascular ischemic change. There is small lacune infarct adjacent to the left caudate nucleus head with another in the right putaminal. Vascular: No hyperdense vessel or unexpected calcification. Skull: Normal. Negative for fracture or focal lesion. Sinuses/Orbits: Globes and orbits are unremarkable. Visualized sinuses and mastoid air cells are clear. Other: None. IMPRESSION: 1. No acute intracranial abnormalities. 2. Mild atrophy and chronic microvascular ischemic change. 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 No change from the prior study. Electronically Signed   By: Amie Portland M.D.   On: 03/30/2017 19:00    EKG  EKG Interpretation  Date/Time:  Sunday March 30 2017 18:52:15 EST Ventricular Rate:  77 PR Interval:    QRS Duration: 96 QT Interval:  414 QTC Calculation: 469 R Axis:   34 Text Interpretation:  Sinus rhythm No significant change since last tracing Confirmed by Cathren Laine ([id number]) on 03/30/2017 7:59:03 PM       Radiology Dg Chest 2 View  Result Date: 03/30/2017 CLINICAL DATA:  Pt c/o dizziness, headache, and problems walking X 2days; hx bronchitis; COPD, diabetic; HTN; ex smoker EXAM: CHEST  2 VIEW COMPARISON:  03/01/2017 FINDINGS: Cardiac silhouette is mildly enlarged. No mediastinal or hilar masses. No convincing adenopathy. Lungs are clear.  No pleural effusion or pneumothorax. Elevated right hemidiaphragm is stable. Skeletal structures are demineralized but grossly intact. IMPRESSION: No acute cardiopulmonary disease. Electronically Signed   By: Amie Portland M.D.   On: 03/30/2017 18:58   Ct Head Wo Contrast  Result Date: 03/30/2017 CLINICAL DATA:  Pt c/o dizziness, headache, and problems walking X 2days EXAM: CT  HEAD WITHOUT CONTRAST TECHNIQUE: Contiguous axial images were obtained from the base of the skull through the vertex without intravenous contrast. COMPARISON:  07/22/2012 FINDINGS: Brain: No evidence of acute infarction, hemorrhage, hydrocephalus, extra-axial collection or mass lesion/mass effect. There is ventricular sulcal enlargement reflecting mild generalized atrophy. Patchy white matter hypoattenuation is noted consistent with mild chronic microvascular ischemic change. There is small lacune infarct adjacent to the left caudate nucleus head with another in the right putaminal. Vascular: No hyperdense vessel or unexpected calcification. Skull: Normal. Negative for fracture or focal lesion. Sinuses/Orbits: Globes and orbits are unremarkable. Visualized sinuses and mastoid air cells are clear. Other: None. IMPRESSION: 1. No acute intracranial abnormalities. 2. Mild atrophy and chronic microvascular ischemic change. 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 No change from the prior study. Electronically Signed   By: Amie Portland M.D.   On: 03/30/2017 19:00    Procedures Procedures (including critical care time)  Medications Ordered in ED Medications - No data to display   Initial Impression / Assessment and Plan / ED Course  I have reviewed the triage vital signs and the nursing notes.  Pertinent labs & imaging results that were available during my care of the patient were reviewed by me and considered in my medical decision making (see chart for details).  Iv ns. Labs. Imaging.  Reviewed nursing notes and prior charts for  additional history.   Albuterol and atrovent neb.  Recheck no wheezing.  On recheck pt alert, comfortable appearing. No room spinning, no faintness or lightheadedness.  Vitals:   03/30/17 1810  BP: 131/69  Pulse: 84  Resp: 16  Temp: 98.1 F (36.7 C)  SpO2: 95%   Pt currently appears stable for d/c.     Final Clinical Impressions(s) / ED Diagnoses   Final diagnoses:   None    New Prescriptions This SmartLink is deprecated. Use AVSMEDLIST instead to display the medication list for a patient.   Cathren Laine, MD 03/30/17 [phone]

## 2017-03-30 NOTE — ED Notes (Signed)
[facility] picking up patient to take back to facility.

## 2017-03-30 NOTE — Discharge Instructions (Signed)
It was our pleasure to provide your ER care today - we hope that you feel better.  Rest. Drink adequate fluids.   Your lab tests look good.  Follow up with your doctor in the coming week for recheck.  Return to ER if worse, new symptoms, fevers, new or severe pain, fainting, other concern.

## 2017-03-30 NOTE — ED Notes (Signed)
Pt walked to restroom with one staff assist.

## 2017-03-30 NOTE — ED Notes (Signed)
Bed: [id number] Expected date:  Expected time:  Means of arrival:  Comments: 76 yo HA. dizziness

## 2017-03-30 NOTE — ED Notes (Signed)
Patient waiting on PTAR. 

## 2017-03-30 NOTE — ED Notes (Signed)
EKG delay due to pt being in CT

## 2017-03-30 NOTE — ED Notes (Signed)
Patient given water to drink.  

## 2017-06-26 ENCOUNTER — Emergency Department (HOSPITAL_COMMUNITY): Payer: Medicare Other

## 2017-06-26 ENCOUNTER — Other Ambulatory Visit: Payer: Self-pay

## 2017-06-26 ENCOUNTER — Emergency Department (HOSPITAL_COMMUNITY)
Admission: EM | Admit: 2017-06-26 | Discharge: 2017-06-26 | Disposition: A | Discharge status: Home / Self Care | Payer: Medicare Other | Attending: Emergency Medicine | Admitting: Emergency Medicine

## 2017-06-26 DIAGNOSIS — I110 Hypertensive heart disease with heart failure: Secondary | ICD-10-CM | POA: Diagnosis not present

## 2017-06-26 DIAGNOSIS — Z79899 Other long term (current) drug therapy: Secondary | ICD-10-CM | POA: Insufficient documentation

## 2017-06-26 DIAGNOSIS — F0390 Unspecified dementia without behavioral disturbance: Secondary | ICD-10-CM | POA: Diagnosis not present

## 2017-06-26 DIAGNOSIS — R0600 Dyspnea, unspecified: Secondary | ICD-10-CM | POA: Diagnosis present

## 2017-06-26 DIAGNOSIS — E119 Type 2 diabetes mellitus without complications: Secondary | ICD-10-CM | POA: Insufficient documentation

## 2017-06-26 DIAGNOSIS — F319 Bipolar disorder, unspecified: Secondary | ICD-10-CM | POA: Insufficient documentation

## 2017-06-26 DIAGNOSIS — I5030 Unspecified diastolic (congestive) heart failure: Secondary | ICD-10-CM | POA: Diagnosis not present

## 2017-06-26 DIAGNOSIS — J441 Chronic obstructive pulmonary disease with (acute) exacerbation: Secondary | ICD-10-CM | POA: Diagnosis not present

## 2017-06-26 DIAGNOSIS — Z7902 Long term (current) use of antithrombotics/antiplatelets: Secondary | ICD-10-CM | POA: Diagnosis not present

## 2017-06-26 LAB — CBC WITH DIFFERENTIAL/PLATELET
Basophils Absolute: 0 10*3/uL (ref 0.0–0.1)
Basophils Relative: 0 %
Eosinophils Absolute: 0.1 10*3/uL (ref 0.0–0.7)
Eosinophils Relative: 1 %
HEMATOCRIT: 32.8 % — AB (ref 36.0–46.0)
HEMOGLOBIN: 11.1 g/dL — AB (ref 12.0–15.0)
LYMPHS ABS: 4.8 10*3/uL — AB (ref 0.7–4.0)
Lymphocytes Relative: 37 %
MCH: 30.7 pg (ref 26.0–34.0)
MCHC: 33.8 g/dL (ref 30.0–36.0)
MCV: 90.9 fL (ref 78.0–100.0)
MONOS PCT: 11 %
Monocytes Absolute: 1.4 10*3/uL — ABNORMAL HIGH (ref 0.1–1.0)
NEUTROS ABS: 6.8 10*3/uL (ref 1.7–7.7)
Neutrophils Relative %: 51 %
Platelets: 253 10*3/uL (ref 150–400)
RBC: 3.61 MIL/uL — ABNORMAL LOW (ref 3.87–5.11)
RDW: 14 % (ref 11.5–15.5)
WBC: 13.1 10*3/uL — ABNORMAL HIGH (ref 4.0–10.5)

## 2017-06-26 LAB — BASIC METABOLIC PANEL
ANION GAP: 7 (ref 5–15)
BUN: 9 mg/dL (ref 6–20)
CHLORIDE: 101 mmol/L (ref 101–111)
CO2: 27 mmol/L (ref 22–32)
Calcium: 9.3 mg/dL (ref 8.9–10.3)
Creatinine, Ser: 0.62 mg/dL (ref 0.44–1.00)
GFR calc Af Amer: >60 mL/min (ref >60)
GFR calc non Af Amer: >60 mL/min (ref >60)
GLUCOSE: 108 mg/dL — AB (ref 65–99)
Potassium: 4.2 mmol/L (ref 3.5–5.1)
Sodium: 135 mmol/L (ref 135–145)

## 2017-06-26 LAB — I-STAT TROPONIN, ED: Troponin i, poc: 0 ng/mL (ref 0.00–0.08)

## 2017-06-26 LAB — BRAIN NATRIURETIC PEPTIDE: B Natriuretic Peptide: 122.5 pg/mL — ABNORMAL HIGH (ref 0.0–100.0)

## 2017-06-26 MED ORDER — IPRATROPIUM-ALBUTEROL 0.5-2.5 (3) MG/3ML IN SOLN
3.0000 mL | Freq: Once | RESPIRATORY_TRACT | Status: AC
  Administered 2017-06-26: 3 mL via RESPIRATORY_TRACT
  Filled 2017-06-26: qty 3

## 2017-06-26 NOTE — ED Triage Notes (Addendum)
[facility]  Has been shob x 3 days and had a PCXR yesterday,however results haven't been given but per EMS report, pt stated she knows she has pna and wanted to come to the ED bc she will have better treatment here.  EMS gave alberterol 5mg  neb for wheezing.  Pre neb sats RA 94%, post neb sats 100%.  Pt has hx of alzheimers but answering questions appropriately.  VSS 167/69, 74.  No c/o fevers.

## 2017-06-26 NOTE — ED Notes (Signed)
Patient transported to X-ray 

## 2017-06-26 NOTE — ED Notes (Signed)
Patient given ham sandwich and water. 

## 2017-06-26 NOTE — ED Notes (Signed)
PTAR called for transport.  

## 2017-06-26 NOTE — ED Provider Notes (Signed)
[facility]-EMERGENCY DEPT Provider Note   CSN: [id number] Arrival date & time: 06/26/17  1036     History   Chief Complaint Chief Complaint  Patient presents with  . Shortness of Breath    HPI Cindy Padilla is a 77 y.o. female.  Level 5 caveat dementia history is obtained from records accompanying patient, from EMS, from Cindy Padilla, nurse technician at [facility] memory care unit  HPI Patient complains of congestion in her face and in her chest onset yesterday, with difficulty breathing.  No known fever.  She was treated with Tylenol last dose yesterday.  EMS treated her with albuterol nebulized treatment while in route.  Patient states her breathing is presently normal.  Patient denies pain anywhere.  Other associated symptoms include minimal cough Past Medical History:  Diagnosis Date  . Arthritis   . Bipolar 1 disorder (HCC)   . Bronchitis   . Chronic back pain   . Constipation   . COPD (chronic obstructive pulmonary disease) (HCC)   . Dementia   . Diabetes mellitus   . GERD (gastroesophageal reflux disease)   . Hiatal hernia   . Hypertension   . IBS (irritable bowel syndrome)     Patient Active Problem List   Diagnosis Date Noted  . Acute exacerbation of chronic obstructive pulmonary disease (COPD) (HCC) 02/26/2017  . COPD with acute exacerbation (HCC) 02/24/2017  . Bipolar 1 disorder (HCC) 02/24/2017  . Normocytic anemia 02/24/2017  . Streptococcus pneumoniae pneumonia (HCC) 06/15/2016  . Sepsis (HCC) 06/10/2016  . Morbid obesity (HCC) 04/14/2015  . Upper airway cough syndrome 04/14/2015  . Ventral hernia 04/24/2014  . Constipation 04/24/2014  . Abdominal pain, epigastric 04/24/2014  . Partial small bowel obstruction (HCC) 04/22/2014  . Hemorrhoids 04/22/2014  . Abnormality of gait 09/30/2013  . 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 Diastolic heart failure (HCC) 07/27/2013  . Dementia with  behavioral disturbance 07/27/2013  . HCAP (healthcare-associated pneumonia) 07/24/2013  . Acute respiratory failure (HCC) 02/21/2013  . Weakness 02/09/2013  . Hypokalemia 02/09/2013  . Hyponatremia 02/09/2013  . Shortness of breath 02/09/2013  . HTN (hypertension) 02/09/2013  . Other and unspecified hyperlipidemia 02/09/2013    Past Surgical History:  Procedure Laterality Date  . gallbladder    . MANDIBLE SURGERY    . PARTIAL HYSTERECTOMY      OB History    No data available       Home Medications    Prior to Admission medications   Medication Sig Start Date End Date Taking? Authorizing Provider  acetaminophen (TYLENOL) 325 MG tablet Take 650 mg by mouth 3 (three) times daily.    [provider]  albuterol (PROVENTIL HFA;VENTOLIN HFA) 108 (90 BASE) MCG/ACT inhaler Inhale 2 puffs into the lungs every 4 (four) hours as needed for wheezing or shortness of breath.    [provider]  alum & mag hydroxide-simeth (GERI-LANTA) 200-200-20 MG/5ML suspension Take 30 mLs by mouth 4 (four) times daily as needed for indigestion or heartburn.     [provider]  amLODipine (NORVASC) 5 MG tablet Take 5 mg by mouth 2 (two) times daily.     [provider]  antiseptic oral rinse (BIOTENE) LIQD 15 mLs by Mouth Rinse route at bedtime. Swish and spit    [provider]  atorvastatin (LIPITOR) 40 MG tablet Take 40 mg by mouth daily.     [provider]  benzonatate (TESSALON) 100 MG capsule Take  1 capsule (100 mg total) by mouth 3 (three) times daily. 03/04/17   [name], MD  budesonide-formoterol [facility]) 160-4.5 MCG/ACT inhaler Inhale 2 puffs into the lungs 2 (two) times daily.     [provider]  cholecalciferol (VITAMIN D) 1000 UNITS tablet Take 1,000 Units by mouth daily.     [provider]  clopidogrel (PLAVIX) 75 MG tablet Take 75 mg by mouth daily.     [provider]  dicyclomine (BENTYL) 20 MG  tablet Take 1 tablet (20 mg total) by mouth 2 (two) times daily. 10/31/16   [name], MD  donepezil (ARICEPT) 10 MG tablet Take 10 mg by mouth at bedtime.     [provider]  esomeprazole (NEXIUM) 40 MG capsule Take 40 mg by mouth daily before breakfast.    [provider]  ferrous sulfate 325 (65 FE) MG tablet Take 325 mg by mouth daily with breakfast.    [provider]  fexofenadine (ALLEGRA) 180 MG tablet Take 180 mg by mouth daily.    [provider]  fluticasone (FLONASE) 50 MCG/ACT nasal spray Place 2 sprays into both nostrils daily.     [provider]  furosemide (LASIX) 20 MG tablet Take 20 mg by mouth daily with breakfast.    [provider]  guaiFENesin (MUCINEX) 600 MG 12 hr tablet Take 1 tablet (600 mg total) by mouth 2 (two) times daily. Patient not taking: Reported on 02/24/2017 06/15/16   [name], MD  guaiFENesin-dextromethorphan (ROBITUSSIN DM) 100-10 MG/5ML syrup Take 5 mLs by mouth every 4 (four) hours as needed for cough. 03/04/17   [name], MD  ipratropium-albuterol (DUONEB) 0.5-2.5 (3) MG/3ML SOLN Take 3 mLs by nebulization 4 (four) times daily. 03/04/17   [name], MD  lamoTRIgine (LAMICTAL) 200 MG tablet Take 200 mg by mouth 2 (two) times daily.    [provider]  levofloxacin (LEVAQUIN) 750 MG tablet Take 1 tablet (750 mg total) by mouth daily. 03/05/17   [name], MD  lidocaine (ASPERCREME W/LIDOCAINE) 4 % cream Apply 1 application topically 3 (three) times daily as needed (for pain).     [provider]  loperamide (IMODIUM) 2 MG capsule Take 2 mg by mouth every 3 (three) hours as needed for diarrhea or loose stools.     [provider]  LORazepam (ATIVAN) 1 MG tablet Take 1 tablet (1 mg total) by mouth every 12 (twelve) hours. 03/04/17   [name], MD  magnesium hydroxide (MILK OF MAGNESIA) 400 MG/5ML suspension Take 30 mLs by mouth at bedtime as needed  for mild constipation.    [provider]  magnesium oxide (MAG-OX) 400 (241.3 Mg) MG tablet Take 400 mg by mouth daily.    [provider]  miconazole (BAZA ANTIFUNGAL) 2 % cream Apply 1 application topically daily.    [provider]  montelukast (SINGULAIR) 10 MG tablet Take 10 mg by mouth at bedtime.    [provider]  phenylephrine-shark liver oil-mineral oil-petrolatum (PREPARATION H) 0.25-3-14-71.9 % rectal ointment Place 1 application rectally 2 (two) times daily as needed for hemorrhoids.    [provider]  predniSONE (DELTASONE) 20 MG tablet Take 2 tablets once daily for 4 days, then take 1 tablet once daily for 4 days, then STOP. 03/04/17   [name], MD  QUEtiapine (SEROQUEL) 100 MG tablet Take 150 mg by mouth 2 (two) times daily.     [provider]  ranitidine (ZANTAC) 150 MG tablet  Take 150 mg by mouth at bedtime.     [provider]  senna-docusate (SENOKOT-S) 8.6-50 MG per tablet Take 1 tablet by mouth 2 (two) times daily.    [provider]  triamcinolone cream (KENALOG) 0.1 % Apply 1 application topically daily as needed (for irritation).     [provider]  valsartan (DIOVAN) 320 MG tablet Take 1 tablet (320 mg total) by mouth daily. 04/16/13   Nyoka Cowden, MD  Vitamins A & D (VITAMIN A & D) ointment Apply 1 application topically every 6 (six) hours as needed (for rectal pain).     [provider]    Family History Family History  Problem Relation Age of Onset  . Emphysema Mother   . Cancer Other     Social History Social History   Tobacco Use  . Smoking status: Former Smoker    Packs/day: 0.50    Years: 2.00    Pack years: 1.00    Types: Cigarettes    Last attempt to quit: 05/28/1959    Years since quitting: 58.1  . Smokeless tobacco: Former Neurosurgeon    Types: Snuff    Quit date: 05/28/1959  . Tobacco comment: Pt used snuff for 33 years  Substance Use Topics  .  Alcohol use: No  . Drug use: No    DNR CODE STATUS Allergies   Aminoglycosides; Aspirin; Codeine; Ivp dye [iodinated diagnostic agents]; Morphine and related; Neomycin; Penicillins; Phenothiazines; Promethazine; Sulfa antibiotics; and Tetracyclines & related   Review of Systems Review of Systems  Unable to perform ROS: Dementia  HENT: Positive for congestion.   Respiratory: Positive for shortness of breath.      Physical Exam Updated Vital Signs BP (!) 161/62 (BP Location: Left Arm)   Pulse 92   Temp 98.4 F (36.9 C) (Oral)   Resp 18   Ht 5' (1.524 m)   Wt 81.6 kg (180 lb)   SpO2 94%   BMI 35.15 kg/m   Physical Exam  Constitutional: She appears well-developed and well-nourished. No distress.  HENT:  Head: Normocephalic and atraumatic.  Eyes: Conjunctivae are normal. Pupils are equal, round, and reactive to light.  Neck: Neck supple. No tracheal deviation present. No thyromegaly present.  Cardiovascular: Normal rate and regular rhythm.  No murmur heard. Pulmonary/Chest:  Scant diffuse minimal rhonchi.  No respiratory distress  Abdominal: Soft. Bowel sounds are normal. She exhibits no distension. There is no tenderness.  Obese  Musculoskeletal: Normal range of motion. She exhibits edema. She exhibits no tenderness.  Lateral lower extremities with 2+ pretibial pitting edema with thick and chronic appearing skin changes.  Skin is red and not warm or tender.  DP pulses 2+ bilaterally  Neurological: She is alert. Coordination normal.  Skin: Skin is warm and dry. No rash noted.  Psychiatric: She has a normal mood and affect.  Nursing note and vitals reviewed.    ED Treatments / Results  Labs (all labs ordered are listed, but only abnormal results are displayed) Labs Reviewed  BRAIN NATRIURETIC PEPTIDE  BASIC METABOLIC PANEL  CBC WITH DIFFERENTIAL/PLATELET  I-STAT TROPONIN, ED    EKG  EKG Interpretation Padilla       Radiology No results  found.  Procedures Procedures (including critical care time)  Medications Ordered in ED Medications - No data to display Chest x-ray viewed by me Results for orders placed or performed during the hospital encounter of 06/26/17  Brain natriuretic peptide  Result Value Ref Range  B Natriuretic Peptide 122.5 (H) 0.0 - 100.0 pg/mL  Basic metabolic panel  Result Value Ref Range   Sodium 135 135 - 145 mmol/L   Potassium 4.2 3.5 - 5.1 mmol/L   Chloride 101 101 - 111 mmol/L   CO2 27 22 - 32 mmol/L   Glucose, Bld 108 (H) 65 - 99 mg/dL   BUN 9 6 - 20 mg/dL   Creatinine, Ser 1.610.62 0.44 - 1.00 mg/dL   Calcium 9.3 8.9 - 09.610.3 mg/dL   GFR calc non Af Amer >60 >60 mL/min   GFR calc Af Amer >60 >60 mL/min   Anion gap 7 5 - 15  CBC with Differential/Platelet  Result Value Ref Range   WBC 13.1 (H) 4.0 - 10.5 K/uL   RBC 3.61 (L) 3.87 - 5.11 MIL/uL   Hemoglobin 11.1 (L) 12.0 - 15.0 g/dL   HCT 04.532.8 (L) 40.936.0 - 81.146.0 %   MCV 90.9 78.0 - 100.0 fL   MCH 30.7 26.0 - 34.0 pg   MCHC 33.8 30.0 - 36.0 g/dL   RDW 91.414.0 78.211.5 - 95.615.5 %   Platelets 253 150 - 400 K/uL   Neutrophils Relative % 51 %   Neutro Abs 6.8 1.7 - 7.7 K/uL   Lymphocytes Relative 37 %   Lymphs Abs 4.8 (H) 0.7 - 4.0 K/uL   Monocytes Relative 11 %   Monocytes Absolute 1.4 (H) 0.1 - 1.0 K/uL   Eosinophils Relative 1 %   Eosinophils Absolute 0.1 0.0 - 0.7 K/uL   Basophils Relative 0 %   Basophils Absolute 0.0 0.0 - 0.1 K/uL  I-stat troponin, ED  Result Value Ref Range   Troponin i, poc 0.00 0.00 - 0.08 ng/mL   Comment 3           Dg Chest 2 View  Result Date: 06/26/2017 CLINICAL DATA:  Shortness of breath. EXAM: CHEST  2 VIEW COMPARISON:  03/30/2017 03/01/2017. FINDINGS: Neurostimulator noted in stable position. Heart size stable. Low lung volumes with mild basilar atelectasis and/or scarring. Similar findings noted on prior exams no pleural effusion or pneumothorax. Thoracic spine scoliosis. IMPRESSION: Low lung volumes with mild  basilar atelectasis and/or scarring. Similar findings noted on prior exam. Chest is unchanged. Electronically Signed   By: Maisie Fushomas  Register   On: 06/26/2017 11:33   Initial Impression / Assessment and Plan / ED Course  I have reviewed the triage vital signs and the nursing notes.  Pertinent labs & imaging results that were available during my care of the patient were reviewed by me and considered in my medical decision making (see chart for details).     She feels her breathing is much improved after 2 nebulized treatments.  She feels ready to go home.  She is eating lunch in bed.  She is in no respiratory distress. Continue present medication regimen.  I called patient's daughter and healthcare power of attorney Leonides SakeSusan Maines is in agreement with plan Final Clinical Impressions(s) / ED Diagnoses  Diagnosis COPD Final diagnoses:  Padilla    ED Discharge Orders    Padilla       Doug SouJacubowitz, Annell Canty, MD 06/26/17 1341

## 2017-06-26 NOTE — ED Notes (Signed)
Bed: [id number] Expected date:  Expected time:  Means of arrival:  Comments: Ems-pna

## 2017-06-26 NOTE — ED Notes (Signed)
Patient ambulatory to restroom with one staff assistance without difficulty. After ambulation, patient's oxygen saturation remained at 92% on RA.

## 2017-06-26 NOTE — Discharge Instructions (Signed)
Continue present medication regimen.  Return or call Cindy Padilla's physician if her condition worsens for any reason

## 2017-11-07 ENCOUNTER — Emergency Department (HOSPITAL_COMMUNITY): Payer: Medicare Other

## 2017-11-07 ENCOUNTER — Other Ambulatory Visit: Payer: Self-pay

## 2017-11-07 ENCOUNTER — Emergency Department (HOSPITAL_COMMUNITY)
Admission: EM | Admit: 2017-11-07 | Discharge: 2017-11-07 | Disposition: A | Discharge status: Home / Self Care | Payer: Medicare Other | Attending: Emergency Medicine | Admitting: Emergency Medicine

## 2017-11-07 DIAGNOSIS — S0990XA Unspecified injury of head, initial encounter: Secondary | ICD-10-CM | POA: Diagnosis present

## 2017-11-07 DIAGNOSIS — F0390 Unspecified dementia without behavioral disturbance: Secondary | ICD-10-CM | POA: Insufficient documentation

## 2017-11-07 DIAGNOSIS — F319 Bipolar disorder, unspecified: Secondary | ICD-10-CM | POA: Diagnosis not present

## 2017-11-07 DIAGNOSIS — I10 Essential (primary) hypertension: Secondary | ICD-10-CM | POA: Diagnosis not present

## 2017-11-07 DIAGNOSIS — J449 Chronic obstructive pulmonary disease, unspecified: Secondary | ICD-10-CM | POA: Diagnosis not present

## 2017-11-07 DIAGNOSIS — Y998 Other external cause status: Secondary | ICD-10-CM | POA: Insufficient documentation

## 2017-11-07 DIAGNOSIS — M25551 Pain in right hip: Secondary | ICD-10-CM | POA: Insufficient documentation

## 2017-11-07 DIAGNOSIS — W19XXXA Unspecified fall, initial encounter: Secondary | ICD-10-CM

## 2017-11-07 DIAGNOSIS — Y92129 Unspecified place in nursing home as the place of occurrence of the external cause: Secondary | ICD-10-CM | POA: Diagnosis not present

## 2017-11-07 DIAGNOSIS — W01190A Fall on same level from slipping, tripping and stumbling with subsequent striking against furniture, initial encounter: Secondary | ICD-10-CM | POA: Diagnosis not present

## 2017-11-07 DIAGNOSIS — Z79899 Other long term (current) drug therapy: Secondary | ICD-10-CM | POA: Insufficient documentation

## 2017-11-07 DIAGNOSIS — Z87891 Personal history of nicotine dependence: Secondary | ICD-10-CM | POA: Diagnosis not present

## 2017-11-07 DIAGNOSIS — E119 Type 2 diabetes mellitus without complications: Secondary | ICD-10-CM | POA: Diagnosis not present

## 2017-11-07 DIAGNOSIS — Z7902 Long term (current) use of antithrombotics/antiplatelets: Secondary | ICD-10-CM | POA: Diagnosis not present

## 2017-11-07 DIAGNOSIS — Y9389 Activity, other specified: Secondary | ICD-10-CM | POA: Insufficient documentation

## 2017-11-07 DIAGNOSIS — M25511 Pain in right shoulder: Secondary | ICD-10-CM | POA: Insufficient documentation

## 2017-11-07 MED ORDER — HYDROCODONE-ACETAMINOPHEN 5-325 MG PO TABS
1.0000 | ORAL_TABLET | Freq: Once | ORAL | Status: AC
  Administered 2017-11-07: 1 via ORAL
  Filled 2017-11-07: qty 1

## 2017-11-07 NOTE — ED Provider Notes (Signed)
[facility]-EMERGENCY DEPT Provider Note   CSN: [id number] Arrival date & time: 11/07/17  0054     History   Chief Complaint Chief Complaint  Patient presents with  . Fall    HPI Cindy Padilla is a 77 y.o. female.  HPI 77 year old Caucasian female past medical history significant for COPD, dementia, diabetes, hypertension presents to the emergency department today from nursing facility for mechanical fall.  Patient states that she was walking this evening without her walker and tripped over the nightstand and fell onto her right side.  Patient reports pain from her right head to her right hip.  Patient has not been ambulatory since the event.  Pain is worse with palpation or range of motion.  She has not taken anything for the pain.  Patient denies LOC.  She was assisted up by nursing staff at the facility.  Patient has history of falls and dementia.  She denies being on any blood thinners.  She does report hitting her head but denies any vision changes, lightheadedness, dizziness, chest pain or shortness of breath. Past Medical History:  Diagnosis Date  . Arthritis   . Bipolar 1 disorder (HCC)   . Bronchitis   . Chronic back pain   . Constipation   . COPD (chronic obstructive pulmonary disease) (HCC)   . Dementia   . Diabetes mellitus   . GERD (gastroesophageal reflux disease)   . Hiatal hernia   . Hypertension   . IBS (irritable bowel syndrome)     Patient Active Problem List   Diagnosis Date Noted  . Acute exacerbation of chronic obstructive pulmonary disease (COPD) (HCC) 02/26/2017  . COPD with acute exacerbation (HCC) 02/24/2017  . Bipolar 1 disorder (HCC) 02/24/2017  . Normocytic anemia 02/24/2017  . Streptococcus pneumoniae pneumonia (HCC) 06/15/2016  . Sepsis (HCC) 06/10/2016  . Morbid obesity (HCC) 04/14/2015  . Upper airway cough syndrome 04/14/2015  . Ventral hernia 04/24/2014  . Constipation 04/24/2014  . Abdominal pain,  epigastric 04/24/2014  . Partial small bowel obstruction (HCC) 04/22/2014  . Hemorrhoids 04/22/2014  . Abnormality of gait 09/30/2013  . 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 Diastolic heart failure (HCC) 07/27/2013  . Dementia with behavioral disturbance 07/27/2013  . HCAP (healthcare-associated pneumonia) 07/24/2013  . Acute respiratory failure (HCC) 02/21/2013  . Weakness 02/09/2013  . Hypokalemia 02/09/2013  . Hyponatremia 02/09/2013  . Shortness of breath 02/09/2013  . HTN (hypertension) 02/09/2013  . Other and unspecified hyperlipidemia 02/09/2013    Past Surgical History:  Procedure Laterality Date  . gallbladder    . MANDIBLE SURGERY    . PARTIAL HYSTERECTOMY       OB History   None      Home Medications    Prior to Admission medications   Medication Sig Start Date End Date Taking? Authorizing Provider  acetaminophen (TYLENOL) 325 MG tablet Take 650 mg by mouth 3 (three) times daily.    [provider]  albuterol (PROVENTIL HFA;VENTOLIN HFA) 108 (90 BASE) MCG/ACT inhaler Inhale 2 puffs into the lungs every 4 (four) hours as needed for wheezing or shortness of breath.    [provider]  alum & mag hydroxide-simeth (GERI-LANTA) 200-200-20 MG/5ML suspension Take 30 mLs by mouth 4 (four) times daily as needed for indigestion or heartburn.     [provider]  amLODipine (NORVASC) 5 MG tablet Take 5 mg by mouth 2 (two) times daily.     [provider]  antiseptic oral rinse (BIOTENE) LIQD 15 mLs by Mouth Rinse route at bedtime. Swish and spit    [provider]  atorvastatin (LIPITOR) 40 MG tablet Take 40 mg by mouth daily.     [provider]  benzonatate (TESSALON) 100 MG capsule Take 1 capsule (100 mg total) by mouth 3 (three) times daily. 03/04/17   Osvaldo ShipperKrishnan, Gokul, MD  budesonide-formoterol [facility](SYMBICORT) 160-4.5 MCG/ACT inhaler Inhale 2 puffs into the lungs 2 (two) times daily.      [provider]  cholecalciferol (VITAMIN D) 1000 UNITS tablet Take 1,000 Units by mouth daily.     [provider]  clopidogrel (PLAVIX) 75 MG tablet Take 75 mg by mouth daily.     [provider]  dicyclomine (BENTYL) 20 MG tablet Take 1 tablet (20 mg total) by mouth 2 (two) times daily. 10/31/16   Alvira MondaySchlossman, Erin, MD  donepezil (ARICEPT) 10 MG tablet Take 10 mg by mouth at bedtime.     [provider]  esomeprazole (NEXIUM) 40 MG capsule Take 40 mg by mouth daily before breakfast.    [provider]  ferrous sulfate 325 (65 FE) MG tablet Take 325 mg by mouth daily with breakfast.    [provider]  fexofenadine (ALLEGRA) 180 MG tablet Take 180 mg by mouth daily.    [provider]  fluticasone (FLONASE) 50 MCG/ACT nasal spray Place 2 sprays into both nostrils daily.     [provider]  furosemide (LASIX) 20 MG tablet Take 20 mg by mouth daily with breakfast.    [provider]  guaiFENesin (MUCINEX) 600 MG 12 hr tablet Take 1 tablet (600 mg total) by mouth 2 (two) times daily. Patient not taking: Reported on 02/24/2017 06/15/16   Calvert Cantorizwan, Saima, MD  guaiFENesin-dextromethorphan (ROBITUSSIN DM) 100-10 MG/5ML syrup Take 5 mLs by mouth every 4 (four) hours as needed for cough. 03/04/17   Osvaldo ShipperKrishnan, Gokul, MD  ipratropium-albuterol (DUONEB) 0.5-2.5 (3) MG/3ML SOLN Take 3 mLs by nebulization 4 (four) times daily. 03/04/17   Osvaldo ShipperKrishnan, Gokul, MD  lamoTRIgine (LAMICTAL) 200 MG tablet Take 200 mg by mouth 2 (two) times daily.    [provider]  levofloxacin (LEVAQUIN) 750 MG tablet Take 1 tablet (750 mg total) by mouth daily. 03/05/17   Osvaldo ShipperKrishnan, Gokul, MD  lidocaine (ASPERCREME W/LIDOCAINE) 4 % cream Apply 1 application topically 3 (three) times daily as needed (for pain).     [provider]  loperamide (IMODIUM) 2 MG capsule Take 2 mg by mouth every 3 (three) hours as needed for diarrhea or loose stools.      [provider]  LORazepam (ATIVAN) 1 MG tablet Take 1 tablet (1 mg total) by mouth every 12 (twelve) hours. 03/04/17   Osvaldo ShipperKrishnan, Gokul, MD  magnesium hydroxide (MILK OF MAGNESIA) 400 MG/5ML suspension Take 30 mLs by mouth at bedtime as needed for mild constipation.    [provider]  magnesium oxide (MAG-OX) 400 (241.3 Mg) MG tablet Take 400 mg by mouth daily.    [provider]  miconazole (BAZA ANTIFUNGAL) 2 % cream Apply 1 application topically daily.    [provider]  montelukast (SINGULAIR) 10 MG tablet Take 10 mg by mouth at bedtime.    [provider]  phenylephrine-shark liver oil-mineral oil-petrolatum (PREPARATION H) 0.25-3-14-71.9 % rectal ointment Place 1 application rectally 2 (two) times daily as needed for hemorrhoids.    [provider]  predniSONE (DELTASONE) 20 MG tablet Take 2 tablets once daily  for 4 days, then take 1 tablet once daily for 4 days, then STOP. 03/04/17   [name], MD  QUEtiapine (SEROQUEL) 100 MG tablet Take 150 mg by mouth 2 (two) times daily.     [provider]  ranitidine (ZANTAC) 150 MG tablet Take 150 mg by mouth at bedtime.     [provider]  senna-docusate (SENOKOT-S) 8.6-50 MG per tablet Take 1 tablet by mouth 2 (two) times daily.    [provider]  triamcinolone cream (KENALOG) 0.1 % Apply 1 application topically daily as needed (for irritation).     [provider]  valsartan (DIOVAN) 320 MG tablet Take 1 tablet (320 mg total) by mouth daily. 04/16/13   Nyoka Cowden, MD  Vitamins A & D (VITAMIN A & D) ointment Apply 1 application topically every 6 (six) hours as needed (for rectal pain).     [provider]    Family History Family History  Problem Relation Age of Onset  . Emphysema Mother   . Cancer Other     Social History Social History   Tobacco Use  . Smoking status: Former Smoker    Packs/day: 0.50    Years: 2.00    Pack  years: 1.00    Types: Cigarettes    Last attempt to quit: 05/28/1959    Years since quitting: 58.4  . Smokeless tobacco: Former Neurosurgeon    Types: Snuff    Quit date: 05/28/1959  . Tobacco comment: Pt used snuff for 33 years  Substance Use Topics  . Alcohol use: No  . Drug use: No     Allergies   Aminoglycosides; Aspirin; Codeine; Ivp dye [iodinated diagnostic agents]; Morphine and related; Neomycin; Penicillins; Phenothiazines; Promethazine; Sulfa antibiotics; and Tetracyclines & related   Review of Systems Review of Systems  All other systems reviewed and are negative.    Physical Exam Updated Vital Signs BP (!) 161/54   Pulse (!) 59   Temp 98.4 F (36.9 C) (Oral)   Resp 14   Ht 5' (1.524 m)   Wt 81.6 kg (180 lb)   SpO2 94%   BMI 35.15 kg/m   Physical Exam Physical Exam  Constitutional: Pt is oriented to person, place, and time. Appears well-developed and well-nourished. No distress.  HENT:  Head: Normocephalic and atraumatic.  Ears: No bilateral hemotympanum. Nose: Nose normal. No septal hematoma. Mouth/Throat: Uvula is midline, oropharynx is clear and moist and mucous membranes are normal.  Eyes: Conjunctivae and EOM are normal. Pupils are equal, round, and reactive to light.  Neck: No spinous process tenderness and no muscular tenderness present. No rigidity. Normal range of motion present.  Full ROM without pain No midline cervical tenderness No crepitus, deformity or step-offs No paraspinal tenderness  Cardiovascular: Normal rate, regular rhythm and intact distal pulses.   Pulses:      Radial pulses are 2+ on the right side, and 2+ on the left side.       Dorsalis pedis pulses are 2+ on the right side, and 2+ on the left side.       Posterior tibial pulses are 2+ on the right side, and 2+ on the left side.  Pulmonary/Chest: Effort normal and breath sounds normal. No accessory muscle usage. No respiratory distress. No decreased breath sounds. No wheezes. No  rhonchi. No rales. Exhibits no tenderness and no bony tenderness.  Patient is tender over the right scapular region.  There is no obvious deformity, ecchymosis or edema. No  flail segment, crepitus or deformity Equal chest expansion  Abdominal: Soft. Normal appearance and bowel sounds are normal. There is no tenderness. There is no rigidity, no guarding and no CVA tenderness.  Abd soft and nontender  Musculoskeletal: Normal range of motion.       Thoracic back: Exhibits normal range of motion.       Lumbar back: Exhibits normal range of motion.  Full range of motion of the T-spine and L-spine No tenderness to palpation of the spinous processes of the T-spine or L-spine No crepitus, deformity or step-offs Mild tenderness to palpation of the paraspinous muscles of the L-spine  She with pain range of motion of the right shoulder.  No obvious deformity, ecchymosis or edema.  No pain with range of motion of the right elbow or right wrist.  Grip strength equal bilaterally.  Radial pulses are 2+ bilaterally.  Sensation intact.  Brisk cap refill. She does have some pain to palpation of the right hip joint.  No obvious deformity, ecchymosis or edema.  Strength equal in lower extremities.  DP pulses are 2+ bilaterally.  Skin compartments are soft.  Brisk cap refill. Lymphadenopathy:    Pt has no cervical adenopathy.  Neurological: Pt is alert and oriented to person, place, and time. Normal reflexes. No cranial nerve deficit. GCS eye subscore is 4. GCS verbal subscore is 5. GCS motor subscore is 6.  Reflex Scores:      Bicep reflexes are 2+ on the right side and 2+ on the left side.      Brachioradialis reflexes are 2+ on the right side and 2+ on the left side.      Patellar reflexes are 2+ on the right side and 2+ on the left side.      Achilles reflexes are 2+ on the right side and 2+ on the left side. Speech is clear and goal oriented, follows commands Normal 5/5 strength in upper and lower extremities  bilaterally including dorsiflexion and plantar flexion, strong and equal grip strength Sensation normal to light and sharp touch Moves extremities without ataxia, coordination intact No Clonus  Skin: Skin is warm and dry. No rash noted. Pt is not diaphoretic. No erythema.  Psychiatric: Normal mood and affect.  Nursing note and vitals reviewed.     ED Treatments / Results  Labs (all labs ordered are listed, but only abnormal results are displayed) Labs Reviewed - No data to display  EKG None  Radiology Dg Shoulder Right  Result Date: 11/07/2017 CLINICAL DATA:  Right shoulder pain after fall at nursing home tonight. EXAM: RIGHT SHOULDER - 2+ VIEW COMPARISON:  None. FINDINGS: Normal alignment. Minimal irregularity about the lateral humeral head appears chronic compared to chest CT 06/11/2016. No evidence of acute fracture. No gross focal soft tissue abnormality. IMPRESSION: No fracture or dislocation of the right shoulder. Electronically Signed   By: Rubye Oaks M.D.   On: 11/07/2017 02:54   Ct Head Wo Contrast  Result Date: 11/07/2017 CLINICAL DATA:  Head trauma EXAM: CT HEAD WITHOUT CONTRAST CT CERVICAL SPINE WITHOUT CONTRAST TECHNIQUE: Multidetector CT imaging of the head and cervical spine was performed following the standard protocol without intravenous contrast. Multiplanar CT image reconstructions of the cervical spine were also generated. COMPARISON:  Head CT 03/30/2017 FINDINGS: CT HEAD FINDINGS Brain: There is no mass, hemorrhage or extra-axial collection. The size and configuration of the ventricles and extra-axial CSF spaces are normal. Old left basal ganglia small vessel infarct. There is hypoattenuation of the  periventricular white matter, most commonly indicating chronic ischemic microangiopathy. Vascular: No abnormal hyperdensity of the major intracranial arteries or dural venous sinuses. No intracranial atherosclerosis. Skull: The visualized skull base, calvarium and  extracranial soft tissues are normal. Sinuses/Orbits: No fluid levels or advanced mucosal thickening of the visualized paranasal sinuses. No mastoid or middle ear effusion. The orbits are normal. CT CERVICAL SPINE FINDINGS Alignment: Grade 1 C3-C4 anterolisthesis. Skull base and vertebrae: No acute fracture. Soft tissues and spinal canal: No prevertebral fluid or swelling. No visible canal hematoma. Disc levels: Multilevel uncovertebral and facet hypertrophy. No spinal canal stenosis. Upper chest: No pneumothorax, pulmonary nodule or pleural effusion. Other: Normal visualized paraspinal cervical soft tissues. IMPRESSION: 1. No acute intracranial abnormality. 2. No acute fracture of the cervical spine. 3. Chronic small-vessel ischemia brain. 4. Multilevel cervical degenerative disc disease with uncovertebral and facet hypertrophy. Electronically Signed   By: Deatra Robinson M.D.   On: 11/07/2017 03:16   Ct Chest Wo Contrast  Result Date: 11/07/2017 CLINICAL DATA:  Larey Seat going to bathroom, complains of posterior upper chest and back pain EXAM: CT CHEST WITHOUT CONTRAST TECHNIQUE: Multidetector CT imaging of the chest was performed following the standard protocol without IV contrast. COMPARISON:  Radiograph 06/26/2017, CT chest 06/11/2016 FINDINGS: Cardiovascular: Limited evaluation without intravenous contrast. Moderate aortic atherosclerosis. No aneurysmal dilatation. Mild coronary artery calcification. Heart size upper normal. No pericardial effusion. Mediastinum/Nodes: Midline trachea. No thyroid mass. No significantly enlarged lymph nodes. Esophagus is within normal limits. Lungs/Pleura: Negative for pneumothorax or acute airspace consolidation. No pleural effusion. Upper Abdomen: Water density lesions within the anterior liver consistent with cysts. No acute abnormality. Musculoskeletal: Sternum is intact. Thoracic alignment within normal limits. No definitive acute displaced fracture. IMPRESSION: 1. No CT  evidence for acute intrathoracic abnormality allowing for absence of contrast 2. Large liver cysts Aortic Atherosclerosis (ICD10-I70.0). Electronically Signed   By: Jasmine Pang M.D.   On: 11/07/2017 03:26   Ct Cervical Spine Wo Contrast  Result Date: 11/07/2017 CLINICAL DATA:  Head trauma EXAM: CT HEAD WITHOUT CONTRAST CT CERVICAL SPINE WITHOUT CONTRAST TECHNIQUE: Multidetector CT imaging of the head and cervical spine was performed following the standard protocol without intravenous contrast. Multiplanar CT image reconstructions of the cervical spine were also generated. COMPARISON:  Head CT 03/30/2017 FINDINGS: CT HEAD FINDINGS Brain: There is no mass, hemorrhage or extra-axial collection. The size and configuration of the ventricles and extra-axial CSF spaces are normal. Old left basal ganglia small vessel infarct. There is hypoattenuation of the periventricular white matter, most commonly indicating chronic ischemic microangiopathy. Vascular: No abnormal hyperdensity of the major intracranial arteries or dural venous sinuses. No intracranial atherosclerosis. Skull: The visualized skull base, calvarium and extracranial soft tissues are normal. Sinuses/Orbits: No fluid levels or advanced mucosal thickening of the visualized paranasal sinuses. No mastoid or middle ear effusion. The orbits are normal. CT CERVICAL SPINE FINDINGS Alignment: Grade 1 C3-C4 anterolisthesis. Skull base and vertebrae: No acute fracture. Soft tissues and spinal canal: No prevertebral fluid or swelling. No visible canal hematoma. Disc levels: Multilevel uncovertebral and facet hypertrophy. No spinal canal stenosis. Upper chest: No pneumothorax, pulmonary nodule or pleural effusion. Other: Normal visualized paraspinal cervical soft tissues. IMPRESSION: 1. No acute intracranial abnormality. 2. No acute fracture of the cervical spine. 3. Chronic small-vessel ischemia brain. 4. Multilevel cervical degenerative disc disease with  uncovertebral and facet hypertrophy. Electronically Signed   By: Deatra Robinson M.D.   On: 11/07/2017 03:16   Dg Hip Unilat With Pelvis 2-3  Views Right  Result Date: 11/07/2017 CLINICAL DATA:  Right hip pain after fall at nursing home tonight. EXAM: DG HIP (WITH OR WITHOUT PELVIS) 2-3V RIGHT COMPARISON:  None. FINDINGS: The cortical margins of the bony pelvis and right hip are intact. No fracture. Pubic symphysis and sacroiliac joints are congruent. Both femoral heads are well-seated in the respective acetabula. Mild age related degenerative change of the hips. IMPRESSION: No fracture of the pelvis or right hip. Electronically Signed   By: Rubye Oaks M.D.   On: 11/07/2017 02:55    Procedures Procedures (including critical care time)  Medications Ordered in ED Medications  HYDROcodone-acetaminophen (NORCO/VICODIN) 5-325 MG per tablet 1 tablet (1 tablet Oral Given 11/07/17 0217)     Initial Impression / Assessment and Plan / ED Course  I have reviewed the triage vital signs and the nursing notes.  Pertinent labs & imaging results that were available during my care of the patient were reviewed by me and considered in my medical decision making (see chart for details).     Patient presents to the ED for evaluation following mechanical fall.  She does reside a nursing facility and has a history of Alzheimer's.  Patient denies LOC.  She is neurovascularly intact in all extremities.  No focal neuro deficit on exam.  CT imaging of head, cervical spine and chest reveal no acute abnormalities.  Does show a large liver cyst with history of same.  Patient has no abdominal pain.  X-ray of the right shoulder showed no acute ab normalities.  X-ray of the right hip shows no fracture of the pelvis or hip joint.  Patient has been ambulatory with her walker at baseline with normal gait and denies any pain to her right hip.  Patient treated pain medication in the ED.  Patient transported back to nursing  facility with follow-up PCP.  Discussed pain medication and symptomatic care at home. Pt is hemodynamically stable, in NAD, & able to ambulate in the ED. Evaluation does not show pathology that would require ongoing emergent intervention or inpatient treatment. I explained the diagnosis to the patient. Pain has been managed & has no complaints prior to dc. Pt is comfortable with above plan and is stable for discharge at this time. All questions were answered prior to disposition. Strict return precautions for f/u to the ED were discussed. Encouraged follow up with PCP.  She was also seen and evaluated with my attending who is agreed with the above plan.  Final Clinical Impressions(s) / ED Diagnoses   Final diagnoses:  Fall, initial encounter  Acute pain of right shoulder  Right hip pain    ED Discharge Orders    None       Wallace Keller 11/07/17 2251    Molpus, Jonny Ruiz, MD 11/07/17 2255

## 2017-11-07 NOTE — ED Notes (Signed)
Bed: WA17 Expected date: 11/07/17 Expected time: 12:45 AM Means of arrival: Ambulance Comments: Elderly Fall

## 2017-11-07 NOTE — ED Triage Notes (Addendum)
Pt to ED via [facility] with c/o of fall while trying to sit down on the toilet. Pt c/o of right side pain, 4/10 on movement. Pt has a welt on her Right elbow and she did state she hit her head. Pt is on clopidogrel. Pt does have pedal Edema, and A&O x4 with a hx of Alzheimers.

## 2017-11-07 NOTE — Discharge Instructions (Addendum)
CAT scans performed that showed no signs of acute fractures.  It does show a large liver cyst which you have a history of.  Please follow-up with your primary care doctor.  May take Tylenol and Motrin for pain as needed.  Return to the ED with any worsening symptoms.

## 2017-11-07 NOTE — ED Notes (Signed)
[facility]

## 2017-11-07 NOTE — ED Notes (Signed)
Pt ambulated 20 feet with walker and 1+ assistance. Reports feeling lightheaded and fatigued.

## 2017-11-11 ENCOUNTER — Encounter (HOSPITAL_COMMUNITY): Payer: Self-pay | Admitting: Emergency Medicine

## 2017-11-11 ENCOUNTER — Emergency Department (HOSPITAL_COMMUNITY)
Admission: EM | Admit: 2017-11-11 | Discharge: 2017-11-12 | Disposition: A | Discharge status: Home / Self Care | Payer: Medicare Other | Attending: Emergency Medicine | Admitting: Emergency Medicine

## 2017-11-11 ENCOUNTER — Emergency Department (HOSPITAL_COMMUNITY): Payer: Medicare Other

## 2017-11-11 DIAGNOSIS — Z7902 Long term (current) use of antithrombotics/antiplatelets: Secondary | ICD-10-CM | POA: Insufficient documentation

## 2017-11-11 DIAGNOSIS — F17210 Nicotine dependence, cigarettes, uncomplicated: Secondary | ICD-10-CM | POA: Insufficient documentation

## 2017-11-11 DIAGNOSIS — J449 Chronic obstructive pulmonary disease, unspecified: Secondary | ICD-10-CM | POA: Diagnosis not present

## 2017-11-11 DIAGNOSIS — M79604 Pain in right leg: Secondary | ICD-10-CM | POA: Insufficient documentation

## 2017-11-11 DIAGNOSIS — I5030 Unspecified diastolic (congestive) heart failure: Secondary | ICD-10-CM | POA: Diagnosis not present

## 2017-11-11 DIAGNOSIS — R2243 Localized swelling, mass and lump, lower limb, bilateral: Secondary | ICD-10-CM | POA: Diagnosis present

## 2017-11-11 DIAGNOSIS — Z79899 Other long term (current) drug therapy: Secondary | ICD-10-CM | POA: Insufficient documentation

## 2017-11-11 DIAGNOSIS — E119 Type 2 diabetes mellitus without complications: Secondary | ICD-10-CM | POA: Insufficient documentation

## 2017-11-11 DIAGNOSIS — R609 Edema, unspecified: Secondary | ICD-10-CM

## 2017-11-11 DIAGNOSIS — F0390 Unspecified dementia without behavioral disturbance: Secondary | ICD-10-CM | POA: Diagnosis not present

## 2017-11-11 DIAGNOSIS — M79605 Pain in left leg: Secondary | ICD-10-CM | POA: Diagnosis not present

## 2017-11-11 DIAGNOSIS — I110 Hypertensive heart disease with heart failure: Secondary | ICD-10-CM | POA: Diagnosis not present

## 2017-11-11 MED ORDER — FUROSEMIDE 10 MG/ML IJ SOLN
40.0000 mg | Freq: Once | INTRAMUSCULAR | Status: AC
  Administered 2017-11-12: 40 mg via INTRAVENOUS
  Filled 2017-11-11: qty 4

## 2017-11-11 NOTE — ED Triage Notes (Signed)
Per EMS[facility] c/o pain and swelling of bilateral lower legs x5 days. Reports taking lasix as prescribed. Hx cellulitis.  BP 150/92 HR 72 O2 96%

## 2017-11-11 NOTE — ED Provider Notes (Signed)
[facility]-EMERGENCY DEPT Provider Note   CSN: [id number] Arrival date & time: 11/11/17  2212     History   Chief Complaint Chief Complaint  Patient presents with  . Leg Swelling    HPI Cindy Padilla is a 77 y.o. female.  Patient is a 77 year old female with past medical history of diabetes, hypertension, COPD, and lower extremity edema.  She presents today for evaluation of leg swelling and pain.  This is chronic in nature, but has worsened over the past several days.  She is now having weeping from the skin and difficulty ambulating due to her legs being stiff and painful.  She denies any fevers or chills.  She does report feeling short of breath, however this is not much worse than baseline as she has a history of COPD.  She denies any chest pain.  She currently takes Lasix 20 mg daily and denies having had this dose missed or changed.  She also reports drinking extra water to prevent a flareup of her cystitis.  The history is provided by the patient.    Past Medical History:  Diagnosis Date  . Arthritis   . Bipolar 1 disorder (HCC)   . Bronchitis   . Chronic back pain   . Constipation   . COPD (chronic obstructive pulmonary disease) (HCC)   . Dementia   . Diabetes mellitus   . GERD (gastroesophageal reflux disease)   . Hiatal hernia   . Hypertension   . IBS (irritable bowel syndrome)     Patient Active Problem List   Diagnosis Date Noted  . Acute exacerbation of chronic obstructive pulmonary disease (COPD) (HCC) 02/26/2017  . COPD with acute exacerbation (HCC) 02/24/2017  . Bipolar 1 disorder (HCC) 02/24/2017  . Normocytic anemia 02/24/2017  . Streptococcus pneumoniae pneumonia (HCC) 06/15/2016  . Sepsis (HCC) 06/10/2016  . Morbid obesity (HCC) 04/14/2015  . Upper airway cough syndrome 04/14/2015  . Ventral hernia 04/24/2014  . Constipation 04/24/2014  . Abdominal pain, epigastric 04/24/2014  . Partial small bowel obstruction (HCC)  04/22/2014  . Hemorrhoids 04/22/2014  . Abnormality of gait 09/30/2013  . 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 Diastolic heart failure (HCC) 07/27/2013  . Dementia with behavioral disturbance 07/27/2013  . HCAP (healthcare-associated pneumonia) 07/24/2013  . Acute respiratory failure (HCC) 02/21/2013  . Weakness 02/09/2013  . Hypokalemia 02/09/2013  . Hyponatremia 02/09/2013  . Shortness of breath 02/09/2013  . HTN (hypertension) 02/09/2013  . Other and unspecified hyperlipidemia 02/09/2013    Past Surgical History:  Procedure Laterality Date  . gallbladder    . MANDIBLE SURGERY    . PARTIAL HYSTERECTOMY       OB History   None      Home Medications    Prior to Admission medications   Medication Sig Start Date End Date Taking? Authorizing Provider  acetaminophen (TYLENOL) 325 MG tablet Take 650 mg by mouth 3 (three) times daily.    [provider]  albuterol (PROVENTIL HFA;VENTOLIN HFA) 108 (90 BASE) MCG/ACT inhaler Inhale 2 puffs into the lungs every 4 (four) hours as needed for wheezing or shortness of breath.    [provider]  alum & mag hydroxide-simeth (GERI-LANTA) 200-200-20 MG/5ML suspension Take 30 mLs by mouth 4 (four) times daily as needed for indigestion or heartburn.     [provider]  amLODipine (NORVASC) 5 MG tablet Take 5 mg by mouth 2 (two) times daily.     [provider]  antiseptic oral rinse (BIOTENE) LIQD 15 mLs by Mouth Rinse route at bedtime. Swish and spit    [provider]  atorvastatin (LIPITOR) 40 MG tablet Take 40 mg by mouth daily.     [provider]  benzonatate (TESSALON) 100 MG capsule Take 1 capsule (100 mg total) by mouth 3 (three) times daily. 03/04/17   [name], MD  budesonide-formoterol [facility]) 160-4.5 MCG/ACT inhaler Inhale 2 puffs into the lungs 2 (two) times daily.     [provider]  cholecalciferol (VITAMIN D) 1000 UNITS  tablet Take 1,000 Units by mouth daily.     [provider]  clopidogrel (PLAVIX) 75 MG tablet Take 75 mg by mouth daily.     [provider]  dicyclomine (BENTYL) 20 MG tablet Take 1 tablet (20 mg total) by mouth 2 (two) times daily. 10/31/16   [name], MD  donepezil (ARICEPT) 10 MG tablet Take 10 mg by mouth at bedtime.     [provider]  esomeprazole (NEXIUM) 40 MG capsule Take 40 mg by mouth daily before breakfast.    [provider]  ferrous sulfate 325 (65 FE) MG tablet Take 325 mg by mouth daily with breakfast.    [provider]  fexofenadine (ALLEGRA) 180 MG tablet Take 180 mg by mouth daily.    [provider]  fluticasone (FLONASE) 50 MCG/ACT nasal spray Place 2 sprays into both nostrils daily.     [provider]  furosemide (LASIX) 20 MG tablet Take 20 mg by mouth daily with breakfast.    [provider]  guaiFENesin (MUCINEX) 600 MG 12 hr tablet Take 1 tablet (600 mg total) by mouth 2 (two) times daily. Patient not taking: Reported on 02/24/2017 06/15/16   [name], MD  guaiFENesin-dextromethorphan (ROBITUSSIN DM) 100-10 MG/5ML syrup Take 5 mLs by mouth every 4 (four) hours as needed for cough. 03/04/17   [name], MD  ipratropium-albuterol (DUONEB) 0.5-2.5 (3) MG/3ML SOLN Take 3 mLs by nebulization 4 (four) times daily. 03/04/17   [name], MD  lamoTRIgine (LAMICTAL) 200 MG tablet Take 200 mg by mouth 2 (two) times daily.    [provider]  levofloxacin (LEVAQUIN) 750 MG tablet Take 1 tablet (750 mg total) by mouth daily. 03/05/17   [name], MD  lidocaine (ASPERCREME W/LIDOCAINE) 4 % cream Apply 1 application topically 3 (three) times daily as needed (for pain).     [provider]  loperamide (IMODIUM) 2 MG capsule Take 2 mg by mouth every 3 (three) hours as needed for diarrhea or loose stools.     [provider]  LORazepam (ATIVAN) 1 MG tablet Take 1  tablet (1 mg total) by mouth every 12 (twelve) hours. 03/04/17   [name], MD  magnesium hydroxide (MILK OF MAGNESIA) 400 MG/5ML suspension Take 30 mLs by mouth at bedtime as needed for mild constipation.    [provider]  magnesium oxide (MAG-OX) 400 (241.3 Mg) MG tablet Take 400 mg by mouth daily.    [provider]  miconazole (BAZA ANTIFUNGAL) 2 % cream Apply 1 application topically daily.    [provider]  montelukast (SINGULAIR) 10 MG tablet Take 10 mg by mouth at bedtime.    [provider]  phenylephrine-shark liver oil-mineral oil-petrolatum (PREPARATION H) 0.25-3-14-71.9 % rectal ointment Place 1 application rectally 2 (two) times daily as needed for hemorrhoids.    [provider]  predniSONE (DELTASONE) 20 MG tablet Take 2 tablets  once daily for 4 days, then take 1 tablet once daily for 4 days, then STOP. 03/04/17   [name], MD  QUEtiapine (SEROQUEL) 100 MG tablet Take 150 mg by mouth 2 (two) times daily.     [provider]  ranitidine (ZANTAC) 150 MG tablet Take 150 mg by mouth at bedtime.     [provider]  senna-docusate (SENOKOT-S) 8.6-50 MG per tablet Take 1 tablet by mouth 2 (two) times daily.    [provider]  triamcinolone cream (KENALOG) 0.1 % Apply 1 application topically daily as needed (for irritation).     [provider]  valsartan (DIOVAN) 320 MG tablet Take 1 tablet (320 mg total) by mouth daily. 04/16/13   Nyoka Cowden, MD  Vitamins A & D (VITAMIN A & D) ointment Apply 1 application topically every 6 (six) hours as needed (for rectal pain).     [provider]    Family History Family History  Problem Relation Age of Onset  . Emphysema Mother   . Cancer Other     Social History Social History   Tobacco Use  . Smoking status: Former Smoker    Packs/day: 0.50    Years: 2.00    Pack years: 1.00    Types: Cigarettes    Last attempt to quit:  05/28/1959    Years since quitting: 58.4  . Smokeless tobacco: Former Neurosurgeon    Types: Snuff    Quit date: 05/28/1959  . Tobacco comment: Pt used snuff for 33 years  Substance Use Topics  . Alcohol use: No  . Drug use: No     Allergies   Aminoglycosides; Aspirin; Codeine; Ivp dye [iodinated diagnostic agents]; Morphine and related; Neomycin; Penicillins; Phenothiazines; Promethazine; Sulfa antibiotics; and Tetracyclines & related   Review of Systems Review of Systems  All other systems reviewed and are negative.    Physical Exam Updated Vital Signs BP (!) 179/80 (BP Location: Right Arm)   Pulse 74   Temp 98.7 F (37.1 C)   Resp 20   SpO2 93%   Physical Exam  Constitutional: She is oriented to person, place, and time. She appears well-developed and well-nourished. No distress.  HENT:  Head: Normocephalic and atraumatic.  Neck: Normal range of motion. Neck supple.  Cardiovascular: Normal rate and regular rhythm. Exam reveals no gallop and no friction rub.  No murmur heard. Pulmonary/Chest: Effort normal and breath sounds normal. No respiratory distress. She has no wheezes.  Abdominal: Soft. Bowel sounds are normal. She exhibits no distension. There is no tenderness.  Musculoskeletal: Normal range of motion. She exhibits edema.  There is 3-4+ pitting edema of both lower extremities.  Legs show findings of chronic venous stasis and are firm, taught, and weeping.  Neurological: She is alert and oriented to person, place, and time.  Skin: Skin is warm and dry. She is not diaphoretic.  Nursing note and vitals reviewed.    ED Treatments / Results  Labs (all labs ordered are listed, but only abnormal results are displayed) Labs Reviewed  BASIC METABOLIC PANEL  CBC WITH DIFFERENTIAL/PLATELET  BRAIN NATRIURETIC PEPTIDE  I-STAT TROPONIN, ED    EKG None  Radiology No results found.  Procedures Procedures (including critical care time)  Medications Ordered in  ED Medications  furosemide (LASIX) injection 40 mg (has no administration in time range)     Initial Impression / Assessment and Plan / ED Course  I have reviewed the triage vital signs and the nursing  notes.  Pertinent labs & imaging results that were available during my care of the patient were reviewed by me and considered in my medical decision making (see chart for details).  Patient presents here with increased lower extremity edema for the past several days.  She does have chronic venous stasis and edema.  She is on a low-dose of Lasix, 20 mg according to her face sheet from the extended care facility.  She was given a dose of IV Lasix here in the ER with good results.  Her laboratory studies are reassuring.  Her renal function is normal and BUN and chest x-ray are not consistent with CHF.  I feel as though she is appropriate for discharge with an increased dose of Lasix and to limit her fluid intake.  She tells me she drinks excess water to prevent a flareup of her cystitis.  I advised her to cut back on this.  Final Clinical Impressions(s) / ED Diagnoses   Final diagnoses:  None    ED Discharge Orders    None       Geoffery Lyons, MD 11/12/17 [phone]

## 2017-11-11 NOTE — ED Notes (Signed)
Pure wick in place 

## 2017-11-12 DIAGNOSIS — R2243 Localized swelling, mass and lump, lower limb, bilateral: Secondary | ICD-10-CM | POA: Diagnosis not present

## 2017-11-12 LAB — BASIC METABOLIC PANEL
Anion gap: 9 (ref 5–15)
BUN: 13 mg/dL (ref 6–20)
CALCIUM: 9.4 mg/dL (ref 8.9–10.3)
CO2: 28 mmol/L (ref 22–32)
CREATININE: 0.8 mg/dL (ref 0.44–1.00)
Chloride: 98 mmol/L — ABNORMAL LOW (ref 101–111)
GFR calc non Af Amer: >60 mL/min (ref >60)
Glucose, Bld: 113 mg/dL — ABNORMAL HIGH (ref 65–99)
Potassium: 4.3 mmol/L (ref 3.5–5.1)
SODIUM: 135 mmol/L (ref 135–145)

## 2017-11-12 LAB — CBC WITH DIFFERENTIAL/PLATELET
Basophils Absolute: 0 10*3/uL (ref 0.0–0.1)
Basophils Relative: 0 %
EOS ABS: 0.1 10*3/uL (ref 0.0–0.7)
EOS PCT: 2 %
HCT: 33.2 % — ABNORMAL LOW (ref 36.0–46.0)
HEMOGLOBIN: 11.2 g/dL — AB (ref 12.0–15.0)
Lymphocytes Relative: 34 %
Lymphs Abs: 2.4 10*3/uL (ref 0.7–4.0)
MCH: 30.9 pg (ref 26.0–34.0)
MCHC: 33.7 g/dL (ref 30.0–36.0)
MCV: 91.5 fL (ref 78.0–100.0)
MONOS PCT: 11 %
Monocytes Absolute: 0.8 10*3/uL (ref 0.1–1.0)
NEUTROS PCT: 53 %
Neutro Abs: 3.8 10*3/uL (ref 1.7–7.7)
PLATELETS: 231 10*3/uL (ref 150–400)
RBC: 3.63 MIL/uL — ABNORMAL LOW (ref 3.87–5.11)
RDW: 14.1 % (ref 11.5–15.5)
WBC: 7.2 10*3/uL (ref 4.0–10.5)

## 2017-11-12 LAB — I-STAT TROPONIN, ED: Troponin i, poc: 0.01 ng/mL (ref 0.00–0.08)

## 2017-11-12 LAB — BRAIN NATRIURETIC PEPTIDE: B NATRIURETIC PEPTIDE 5: 63.4 pg/mL (ref 0.0–100.0)

## 2017-11-12 MED ORDER — FUROSEMIDE 40 MG PO TABS: 40.0000 mg | ORAL_TABLET | Freq: Every day | ORAL | 0 refills | Status: DC

## 2017-11-12 MED ORDER — MUPIROCIN CALCIUM 2 % EX CREA: 1.0000 "application " | TOPICAL_CREAM | Freq: Two times a day (BID) | CUTANEOUS | 0 refills | Status: DC

## 2017-11-12 NOTE — Discharge Instructions (Addendum)
Increase your dose of Lasix to 40 mg daily for the next 2 weeks, then resume your normal dose of 20 mg daily.  Return to the emergency department if you develop difficulty breathing, chest pains, or other new and concerning symptoms.

## 2017-11-12 NOTE — ED Notes (Signed)
[facility]

## 2018-04-17 ENCOUNTER — Inpatient Hospital Stay (HOSPITAL_COMMUNITY)
Admission: EM | Admit: 2018-04-17 | Discharge: 2018-04-21 | DRG: 191 | Disposition: A | Discharge status: Skilled Nursing Facility | Payer: Medicare Other | Attending: Internal Medicine | Admitting: Internal Medicine

## 2018-04-17 ENCOUNTER — Encounter (HOSPITAL_COMMUNITY): Payer: Self-pay | Admitting: Emergency Medicine

## 2018-04-17 ENCOUNTER — Other Ambulatory Visit: Payer: Self-pay

## 2018-04-17 ENCOUNTER — Emergency Department (HOSPITAL_COMMUNITY): Payer: Medicare Other

## 2018-04-17 DIAGNOSIS — F319 Bipolar disorder, unspecified: Secondary | ICD-10-CM | POA: Diagnosis not present

## 2018-04-17 DIAGNOSIS — I110 Hypertensive heart disease with heart failure: Secondary | ICD-10-CM | POA: Diagnosis present

## 2018-04-17 DIAGNOSIS — I5032 Chronic diastolic (congestive) heart failure: Secondary | ICD-10-CM | POA: Diagnosis present

## 2018-04-17 DIAGNOSIS — Z880 Allergy status to penicillin: Secondary | ICD-10-CM

## 2018-04-17 DIAGNOSIS — E119 Type 2 diabetes mellitus without complications: Secondary | ICD-10-CM | POA: Diagnosis present

## 2018-04-17 DIAGNOSIS — E871 Hypo-osmolality and hyponatremia: Secondary | ICD-10-CM | POA: Diagnosis present

## 2018-04-17 DIAGNOSIS — Z886 Allergy status to analgesic agent status: Secondary | ICD-10-CM

## 2018-04-17 DIAGNOSIS — Z66 Do not resuscitate: Secondary | ICD-10-CM | POA: Diagnosis present

## 2018-04-17 DIAGNOSIS — F03918 Unspecified dementia, unspecified severity, with other behavioral disturbance: Secondary | ICD-10-CM | POA: Diagnosis present

## 2018-04-17 DIAGNOSIS — J441 Chronic obstructive pulmonary disease with (acute) exacerbation: Secondary | ICD-10-CM | POA: Diagnosis not present

## 2018-04-17 DIAGNOSIS — I10 Essential (primary) hypertension: Secondary | ICD-10-CM | POA: Diagnosis present

## 2018-04-17 DIAGNOSIS — F0391 Unspecified dementia with behavioral disturbance: Secondary | ICD-10-CM

## 2018-04-17 DIAGNOSIS — Z87891 Personal history of nicotine dependence: Secondary | ICD-10-CM

## 2018-04-17 DIAGNOSIS — K219 Gastro-esophageal reflux disease without esophagitis: Secondary | ICD-10-CM | POA: Diagnosis present

## 2018-04-17 DIAGNOSIS — Z888 Allergy status to other drugs, medicaments and biological substances status: Secondary | ICD-10-CM

## 2018-04-17 DIAGNOSIS — Z885 Allergy status to narcotic agent status: Secondary | ICD-10-CM

## 2018-04-17 DIAGNOSIS — Z79899 Other long term (current) drug therapy: Secondary | ICD-10-CM

## 2018-04-17 DIAGNOSIS — I878 Other specified disorders of veins: Secondary | ICD-10-CM | POA: Diagnosis present

## 2018-04-17 DIAGNOSIS — Z91041 Radiographic dye allergy status: Secondary | ICD-10-CM

## 2018-04-17 DIAGNOSIS — Z7951 Long term (current) use of inhaled steroids: Secondary | ICD-10-CM

## 2018-04-17 DIAGNOSIS — D649 Anemia, unspecified: Secondary | ICD-10-CM | POA: Diagnosis present

## 2018-04-17 DIAGNOSIS — Z7902 Long term (current) use of antithrombotics/antiplatelets: Secondary | ICD-10-CM

## 2018-04-17 LAB — BASIC METABOLIC PANEL
Anion gap: 11 (ref 5–15)
BUN: 16 mg/dL (ref 8–23)
CO2: 26 mmol/L (ref 22–32)
Calcium: 8.7 mg/dL — ABNORMAL LOW (ref 8.9–10.3)
Chloride: 96 mmol/L — ABNORMAL LOW (ref 98–111)
Creatinine, Ser: 0.81 mg/dL (ref 0.44–1.00)
GFR calc Af Amer: >60 mL/min (ref >60)
GLUCOSE: 134 mg/dL — AB (ref 70–99)
POTASSIUM: 4 mmol/L (ref 3.5–5.1)
Sodium: 133 mmol/L — ABNORMAL LOW (ref 135–145)

## 2018-04-17 LAB — CBC
HEMATOCRIT: 32 % — AB (ref 36.0–46.0)
HEMOGLOBIN: 10.3 g/dL — AB (ref 12.0–15.0)
MCH: 29.4 pg (ref 26.0–34.0)
MCHC: 32.2 g/dL (ref 30.0–36.0)
MCV: 91.4 fL (ref 80.0–100.0)
Platelets: 197 10*3/uL (ref 150–400)
RBC: 3.5 MIL/uL — ABNORMAL LOW (ref 3.87–5.11)
RDW: 14.8 % (ref 11.5–15.5)
WBC: 6.3 10*3/uL (ref 4.0–10.5)
nRBC: 0 % (ref 0.0–0.2)

## 2018-04-17 LAB — I-STAT TROPONIN, ED
TROPONIN I, POC: 0.02 ng/mL (ref 0.00–0.08)
TROPONIN I, POC: 0.02 ng/mL (ref 0.00–0.08)

## 2018-04-17 LAB — BRAIN NATRIURETIC PEPTIDE: B Natriuretic Peptide: 75.6 pg/mL (ref 0.0–100.0)

## 2018-04-17 MED ORDER — ALUM & MAG HYDROXIDE-SIMETH 200-200-20 MG/5ML PO SUSP: 30.0000 mL | Freq: Four times a day (QID) | ORAL | Status: DC | PRN

## 2018-04-17 MED ORDER — SODIUM CHLORIDE 0.9% FLUSH
3.0000 mL | Freq: Two times a day (BID) | INTRAVENOUS | Status: DC
  Administered 2018-04-18 – 2018-04-21 (×5): 3 mL via INTRAVENOUS

## 2018-04-17 MED ORDER — QUETIAPINE FUMARATE 25 MG PO TABS
150.0000 mg | ORAL_TABLET | Freq: Every day | ORAL | Status: DC
  Administered 2018-04-18 – 2018-04-20 (×4): 150 mg via ORAL
  Filled 2018-04-17 (×4): qty 1

## 2018-04-17 MED ORDER — METHYLPREDNISOLONE SODIUM SUCC 125 MG IJ SOLR
60.0000 mg | Freq: Four times a day (QID) | INTRAMUSCULAR | Status: DC
  Administered 2018-04-18 – 2018-04-19 (×8): 60 mg via INTRAVENOUS
  Filled 2018-04-17 (×8): qty 2

## 2018-04-17 MED ORDER — PANTOPRAZOLE SODIUM 40 MG PO TBEC
40.0000 mg | DELAYED_RELEASE_TABLET | Freq: Every day | ORAL | Status: DC
  Administered 2018-04-18 – 2018-04-21 (×4): 40 mg via ORAL
  Filled 2018-04-17 (×4): qty 1

## 2018-04-17 MED ORDER — SODIUM CHLORIDE 0.9 % IV SOLN
500.0000 mg | INTRAVENOUS | Status: DC
  Administered 2018-04-18 – 2018-04-20 (×3): 500 mg via INTRAVENOUS
  Filled 2018-04-17 (×4): qty 500

## 2018-04-17 MED ORDER — QUETIAPINE FUMARATE 25 MG PO TABS
125.0000 mg | ORAL_TABLET | Freq: Every day | ORAL | Status: DC
  Administered 2018-04-18 – 2018-04-21 (×4): 125 mg via ORAL
  Filled 2018-04-17 (×4): qty 1

## 2018-04-17 MED ORDER — ENOXAPARIN SODIUM 40 MG/0.4ML ~~LOC~~ SOLN
40.0000 mg | SUBCUTANEOUS | Status: DC
  Administered 2018-04-18 – 2018-04-20 (×4): 40 mg via SUBCUTANEOUS
  Filled 2018-04-17 (×5): qty 0.4

## 2018-04-17 MED ORDER — IPRATROPIUM-ALBUTEROL 0.5-2.5 (3) MG/3ML IN SOLN
3.0000 mL | Freq: Four times a day (QID) | RESPIRATORY_TRACT | Status: DC
  Administered 2018-04-17: 3 mL via RESPIRATORY_TRACT
  Filled 2018-04-17: qty 3

## 2018-04-17 MED ORDER — LAMOTRIGINE 100 MG PO TABS
200.0000 mg | ORAL_TABLET | Freq: Two times a day (BID) | ORAL | Status: DC
  Administered 2018-04-18 – 2018-04-21 (×8): 200 mg via ORAL
  Filled 2018-04-17 (×9): qty 2

## 2018-04-17 MED ORDER — IPRATROPIUM-ALBUTEROL 0.5-2.5 (3) MG/3ML IN SOLN
3.0000 mL | Freq: Once | RESPIRATORY_TRACT | Status: AC
  Administered 2018-04-17: 3 mL via RESPIRATORY_TRACT
  Filled 2018-04-17: qty 3

## 2018-04-17 MED ORDER — DICYCLOMINE HCL 10 MG PO CAPS
10.0000 mg | ORAL_CAPSULE | Freq: Four times a day (QID) | ORAL | Status: DC
  Administered 2018-04-18 – 2018-04-21 (×16): 10 mg via ORAL
  Filled 2018-04-17 (×16): qty 1

## 2018-04-17 MED ORDER — ATORVASTATIN CALCIUM 40 MG PO TABS
40.0000 mg | ORAL_TABLET | Freq: Every day | ORAL | Status: DC
  Administered 2018-04-18 – 2018-04-21 (×4): 40 mg via ORAL
  Filled 2018-04-17 (×4): qty 1

## 2018-04-17 MED ORDER — IRBESARTAN 300 MG PO TABS
300.0000 mg | ORAL_TABLET | Freq: Every day | ORAL | Status: DC
  Administered 2018-04-18 – 2018-04-21 (×4): 300 mg via ORAL
  Filled 2018-04-17 (×5): qty 1

## 2018-04-17 MED ORDER — MOMETASONE FURO-FORMOTEROL FUM 200-5 MCG/ACT IN AERO
2.0000 | INHALATION_SPRAY | Freq: Two times a day (BID) | RESPIRATORY_TRACT | Status: DC
  Filled 2018-04-17: qty 8.8

## 2018-04-17 MED ORDER — AMLODIPINE BESYLATE 5 MG PO TABS
5.0000 mg | ORAL_TABLET | Freq: Two times a day (BID) | ORAL | Status: DC
  Administered 2018-04-18 – 2018-04-21 (×7): 5 mg via ORAL
  Filled 2018-04-17 (×7): qty 1

## 2018-04-17 MED ORDER — SODIUM CHLORIDE 0.9 % IV SOLN
250.0000 mL | INTRAVENOUS | Status: DC | PRN
  Administered 2018-04-18 – 2018-04-19 (×3): 250 mL via INTRAVENOUS

## 2018-04-17 MED ORDER — ONDANSETRON HCL 4 MG/2ML IJ SOLN: 4.0000 mg | Freq: Four times a day (QID) | INTRAMUSCULAR | Status: DC | PRN

## 2018-04-17 MED ORDER — ALBUTEROL SULFATE (2.5 MG/3ML) 0.083% IN NEBU: 2.5000 mg | INHALATION_SOLUTION | RESPIRATORY_TRACT | Status: DC | PRN

## 2018-04-17 MED ORDER — ACETAMINOPHEN 325 MG PO TABS
650.0000 mg | ORAL_TABLET | Freq: Four times a day (QID) | ORAL | Status: DC | PRN
  Administered 2018-04-20 – 2018-04-21 (×2): 650 mg via ORAL
  Filled 2018-04-17 (×2): qty 2

## 2018-04-17 MED ORDER — LORAZEPAM 1 MG PO TABS
1.0000 mg | ORAL_TABLET | Freq: Two times a day (BID) | ORAL | Status: DC | PRN
  Administered 2018-04-18 – 2018-04-21 (×6): 1 mg via ORAL
  Filled 2018-04-17 (×6): qty 1

## 2018-04-17 MED ORDER — ACETAMINOPHEN 650 MG RE SUPP: 650.0000 mg | Freq: Four times a day (QID) | RECTAL | Status: DC | PRN

## 2018-04-17 MED ORDER — SENNOSIDES-DOCUSATE SODIUM 8.6-50 MG PO TABS
1.0000 | ORAL_TABLET | Freq: Two times a day (BID) | ORAL | Status: DC
  Administered 2018-04-18 – 2018-04-21 (×8): 1 via ORAL
  Filled 2018-04-17 (×8): qty 1

## 2018-04-17 MED ORDER — FUROSEMIDE 40 MG PO TABS
40.0000 mg | ORAL_TABLET | Freq: Every day | ORAL | Status: DC
  Administered 2018-04-18 – 2018-04-21 (×4): 40 mg via ORAL
  Filled 2018-04-17 (×4): qty 1

## 2018-04-17 MED ORDER — MONTELUKAST SODIUM 10 MG PO TABS
10.0000 mg | ORAL_TABLET | Freq: Every day | ORAL | Status: DC
  Administered 2018-04-18 – 2018-04-20 (×3): 10 mg via ORAL
  Filled 2018-04-17 (×4): qty 1

## 2018-04-17 MED ORDER — MAGNESIUM SULFATE 2 GM/50ML IV SOLN
2.0000 g | Freq: Once | INTRAVENOUS | Status: AC
  Administered 2018-04-17: 2 g via INTRAVENOUS
  Filled 2018-04-17: qty 50

## 2018-04-17 MED ORDER — FAMOTIDINE 20 MG PO TABS
20.0000 mg | ORAL_TABLET | Freq: Every day | ORAL | Status: DC
  Administered 2018-04-18 – 2018-04-20 (×4): 20 mg via ORAL
  Filled 2018-04-17 (×4): qty 1

## 2018-04-17 MED ORDER — MAGNESIUM HYDROXIDE 400 MG/5ML PO SUSP: 30.0000 mL | Freq: Every day | ORAL | Status: DC | PRN

## 2018-04-17 MED ORDER — SODIUM CHLORIDE 0.9% FLUSH
3.0000 mL | INTRAVENOUS | Status: DC | PRN
  Administered 2018-04-20: 3 mL via INTRAVENOUS
  Filled 2018-04-17: qty 3

## 2018-04-17 MED ORDER — GUAIFENESIN-DM 100-10 MG/5ML PO SYRP
5.0000 mL | ORAL_SOLUTION | ORAL | Status: DC | PRN
  Administered 2018-04-18 – 2018-04-20 (×5): 5 mL via ORAL
  Filled 2018-04-17 (×5): qty 10

## 2018-04-17 MED ORDER — ONDANSETRON HCL 4 MG PO TABS: 4.0000 mg | ORAL_TABLET | Freq: Four times a day (QID) | ORAL | Status: DC | PRN

## 2018-04-17 MED ORDER — CLOPIDOGREL BISULFATE 75 MG PO TABS
75.0000 mg | ORAL_TABLET | Freq: Every day | ORAL | Status: DC
  Administered 2018-04-18 – 2018-04-21 (×4): 75 mg via ORAL
  Filled 2018-04-17 (×4): qty 1

## 2018-04-17 MED ORDER — DONEPEZIL HCL 10 MG PO TABS
10.0000 mg | ORAL_TABLET | Freq: Every day | ORAL | Status: DC
  Administered 2018-04-18 – 2018-04-20 (×4): 10 mg via ORAL
  Filled 2018-04-17 (×4): qty 1

## 2018-04-17 MED ORDER — LORAZEPAM 1 MG PO TABS
1.0000 mg | ORAL_TABLET | Freq: Once | ORAL | Status: AC
  Administered 2018-04-17: 1 mg via ORAL
  Filled 2018-04-17: qty 1

## 2018-04-17 MED ORDER — ALBUTEROL SULFATE (2.5 MG/3ML) 0.083% IN NEBU
5.0000 mg | INHALATION_SOLUTION | Freq: Once | RESPIRATORY_TRACT | Status: AC
  Administered 2018-04-17: 5 mg via RESPIRATORY_TRACT
  Filled 2018-04-17: qty 6

## 2018-04-17 NOTE — ED Notes (Signed)
Bed: WA24 Expected date:  Expected time:  Means of arrival:  Comments: EMS-COPD 

## 2018-04-17 NOTE — ED Provider Notes (Signed)
[facility]-EMERGENCY DEPT Provider Note   CSN: [id number] Arrival date & time: 04/17/18  1652     History   Chief Complaint Chief Complaint  Patient presents with  . COPD    HPI Cindy Padilla is a 77 y.o. female.  The history is provided by the patient and medical records. No language interpreter was used.  COPD  This is a recurrent problem. The current episode started more than 2 days ago. The problem occurs constantly. The problem has been gradually worsening. Associated symptoms include shortness of breath. Pertinent negatives include no chest pain, no abdominal pain and no headaches. Nothing aggravates the symptoms. Nothing relieves the symptoms. She has tried nothing for the symptoms. The treatment provided no relief.    Past Medical History:  Diagnosis Date  . Arthritis   . Bipolar 1 disorder (HCC)   . Bronchitis   . Chronic back pain   . Constipation   . COPD (chronic obstructive pulmonary disease) (HCC)   . Dementia (HCC)   . Diabetes mellitus   . GERD (gastroesophageal reflux disease)   . Hiatal hernia   . Hypertension   . IBS (irritable bowel syndrome)     Patient Active Problem List   Diagnosis Date Noted  . Acute exacerbation of chronic obstructive pulmonary disease (COPD) (HCC) 02/26/2017  . COPD with acute exacerbation (HCC) 02/24/2017  . Bipolar 1 disorder (HCC) 02/24/2017  . Normocytic anemia 02/24/2017  . Streptococcus pneumoniae pneumonia (HCC) 06/15/2016  . Sepsis (HCC) 06/10/2016  . Morbid obesity (HCC) 04/14/2015  . Upper airway cough syndrome 04/14/2015  . Ventral hernia 04/24/2014  . Constipation 04/24/2014  . Abdominal pain, epigastric 04/24/2014  . Partial small bowel obstruction (HCC) 04/22/2014  . Hemorrhoids 04/22/2014  . Abnormality of gait 09/30/2013  . 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 Diastolic heart failure (HCC) 07/27/2013  . Dementia with behavioral  disturbance (HCC) 07/27/2013  . HCAP (healthcare-associated pneumonia) 07/24/2013  . Acute respiratory failure (HCC) 02/21/2013  . Weakness 02/09/2013  . Hypokalemia 02/09/2013  . Hyponatremia 02/09/2013  . Shortness of breath 02/09/2013  . HTN (hypertension) 02/09/2013  . Other and unspecified hyperlipidemia 02/09/2013    Past Surgical History:  Procedure Laterality Date  . gallbladder    . MANDIBLE SURGERY    . PARTIAL HYSTERECTOMY       OB History   None      Home Medications    Prior to Admission medications   Medication Sig Start Date End Date Taking? Authorizing Provider  acetaminophen (TYLENOL) 325 MG tablet Take 650 mg by mouth 3 (three) times daily.    [provider]  albuterol (PROVENTIL HFA;VENTOLIN HFA) 108 (90 BASE) MCG/ACT inhaler Inhale 2 puffs into the lungs every 4 (four) hours as needed for wheezing or shortness of breath.    [provider]  alum & mag hydroxide-simeth (GERI-LANTA) 200-200-20 MG/5ML suspension Take 30 mLs by mouth 4 (four) times daily as needed for indigestion or heartburn.     [provider]  amLODipine (NORVASC) 5 MG tablet Take 5 mg by mouth 2 (two) times daily.     [provider]  antiseptic oral rinse (BIOTENE) LIQD 15 mLs by Mouth Rinse route at bedtime. Swish and spit    [provider]  atorvastatin (LIPITOR) 40 MG tablet Take 40 mg by mouth daily.     [provider]  benzonatate (TESSALON) 100 MG capsule Take 1 capsule (100 mg total) by  mouth 3 (three) times daily. Patient not taking: Reported on 11/12/2017 03/04/17   [name], MD  budesonide-formoterol [facility]) 160-4.5 MCG/ACT inhaler Inhale 2 puffs into the lungs 2 (two) times daily.     [provider]  cholecalciferol (VITAMIN D) 1000 UNITS tablet Take 1,000 Units by mouth daily.     [provider]  clopidogrel (PLAVIX) 75 MG tablet Take 75 mg by mouth daily.     [provider]    dicyclomine (BENTYL) 10 MG capsule Take 10 mg by mouth 4 (four) times daily.    [provider]  dicyclomine (BENTYL) 20 MG tablet Take 1 tablet (20 mg total) by mouth 2 (two) times daily. Patient not taking: Reported on 11/12/2017 10/31/16   [name], MD  esomeprazole (NEXIUM) 40 MG capsule Take 40 mg by mouth daily before breakfast.    [provider]  ferrous sulfate 325 (65 FE) MG tablet Take 325 mg by mouth daily with breakfast.    [provider]  fluticasone (FLONASE) 50 MCG/ACT nasal spray Place 2 sprays into both nostrils daily.     [provider]  furosemide (LASIX) 40 MG tablet Take 1 tablet (40 mg total) by mouth daily. 11/12/17   Geoffery Lyons, MD  guaiFENesin (MUCINEX) 600 MG 12 hr tablet Take 1 tablet (600 mg total) by mouth 2 (two) times daily. Patient not taking: Reported on 02/24/2017 06/15/16   [name], MD  guaiFENesin-dextromethorphan (ROBITUSSIN DM) 100-10 MG/5ML syrup Take 5 mLs by mouth every 4 (four) hours as needed for cough. 03/04/17   [name], MD  ipratropium-albuterol (DUONEB) 0.5-2.5 (3) MG/3ML SOLN Take 3 mLs by nebulization 4 (four) times daily. 03/04/17   [name], MD  lamoTRIgine (LAMICTAL) 200 MG tablet Take 200 mg by mouth 2 (two) times daily.    [provider]  levofloxacin (LEVAQUIN) 750 MG tablet Take 1 tablet (750 mg total) by mouth daily. Patient not taking: Reported on 11/12/2017 03/05/17   [name], MD  lidocaine (ASPERCREME W/LIDOCAINE) 4 % cream Apply 1 application topically 3 (three) times daily as needed (for pain).     [provider]  loperamide (IMODIUM) 2 MG capsule Take 2 mg by mouth every 3 (three) hours as needed for diarrhea or loose stools.     [provider]  LORazepam (ATIVAN) 1 MG tablet Take 1 tablet (1 mg total) by mouth every 12 (twelve) hours. 03/04/17   [name], MD  magnesium hydroxide (MILK OF MAGNESIA) 400 MG/5ML suspension Take 30 mLs  by mouth at bedtime as needed for mild constipation.    [provider]  magnesium oxide (MAG-OX) 400 (241.3 Mg) MG tablet Take 400 mg by mouth daily.    [provider]  miconazole (BAZA ANTIFUNGAL) 2 % cream Apply 1 application topically daily.    [provider]  montelukast (SINGULAIR) 10 MG tablet Take 10 mg by mouth at bedtime.    [provider]  mupirocin cream (BACTROBAN) 2 % Apply 1 application topically 2 (two) times daily. 11/12/17   Geoffery Lyons, MD  phenylephrine-shark liver oil-mineral oil-petrolatum (PREPARATION H) 0.25-3-14-71.9 % rectal ointment Place 1 application rectally 2 (two) times daily as needed for hemorrhoids.    [provider]  predniSONE (DELTASONE) 20 MG tablet Take 2 tablets once daily for 4 days, then take 1 tablet once daily for 4 days, then STOP. Patient not taking: Reported on 11/12/2017 03/04/17   [name], MD  QUEtiapine (SEROQUEL) 100 MG  tablet Take 150 mg by mouth 2 (two) times daily.     [provider]  ranitidine (ZANTAC) 150 MG tablet Take 150 mg by mouth at bedtime.     [provider]  senna-docusate (SENOKOT-S) 8.6-50 MG per tablet Take 1 tablet by mouth 2 (two) times daily.    [provider]  triamcinolone cream (KENALOG) 0.1 % Apply 1 application topically daily as needed (for irritation).     [provider]  valsartan (DIOVAN) 320 MG tablet Take 1 tablet (320 mg total) by mouth daily. 04/16/13   Nyoka Cowden, MD  Vitamins A & D (VITAMIN A & D) ointment Apply 1 application topically every 6 (six) hours as needed (for rectal pain).     [provider]    Family History Family History  Problem Relation Age of Onset  . Emphysema Mother   . Cancer Other     Social History Social History   Tobacco Use  . Smoking status: Former Smoker    Packs/day: 0.50    Years: 2.00    Pack years: 1.00    Types: Cigarettes    Last attempt to quit: 05/28/1959      Years since quitting: 58.9  . Smokeless tobacco: Former Neurosurgeon    Types: Snuff    Quit date: 05/28/1959  . Tobacco comment: Pt used snuff for 33 years  Substance Use Topics  . Alcohol use: No  . Drug use: No     Allergies   Aminoglycosides; Aspirin; Codeine; Ivp dye [iodinated diagnostic agents]; Morphine and related; Neomycin; Penicillins; Phenothiazines; Promethazine; Sulfa antibiotics; and Tetracyclines & related   Review of Systems Review of Systems  Constitutional: Positive for fatigue. Negative for chills, diaphoresis and fever.  HENT: Positive for congestion and rhinorrhea. Negative for nosebleeds.   Respiratory: Positive for cough, chest tightness, shortness of breath and wheezing. Negative for stridor.   Cardiovascular: Negative for chest pain.  Gastrointestinal: Negative for abdominal pain, constipation, diarrhea, nausea and vomiting.  Genitourinary: Negative for dysuria, flank pain and frequency.  Musculoskeletal: Negative for back pain, neck pain and neck stiffness.  Skin: Positive for rash (wrapped leg cellulitis reportedly unchanged per pt report). Negative for wound.  Neurological: Negative for light-headedness and headaches.  Psychiatric/Behavioral: Negative for agitation and confusion.  All other systems reviewed and are negative.    Physical Exam Updated Vital Signs BP (!) 158/64 (BP Location: Left Arm)   Pulse 82   Temp 98.8 F (37.1 C) (Oral)   Resp 18   Ht 5' (1.524 m)   Wt 81.6 kg   SpO2 100%   BMI 35.13 kg/m   Physical Exam  Constitutional: She is oriented to person, place, and time. She appears well-developed and well-nourished. No distress.  HENT:  Head: Normocephalic and atraumatic.  Nose: Nose normal.  Mouth/Throat: Oropharynx is clear and moist. No oropharyngeal exudate.  Eyes: Pupils are equal, round, and reactive to light. Conjunctivae and EOM are normal.  Neck: Neck supple.  Cardiovascular: Normal rate and regular rhythm.  No murmur  heard. Pulmonary/Chest: Effort normal. No stridor. No respiratory distress. She has wheezes. She has rales. She exhibits no tenderness.  Abdominal: Soft. She exhibits no distension. There is no tenderness. There is no guarding.  Musculoskeletal: She exhibits edema. She exhibits no tenderness.  Neurological: She is alert and oriented to person, place, and time. No sensory deficit. She exhibits normal muscle tone.  Skin: Skin is warm and dry. Capillary refill takes less than 2  seconds. No rash noted. She is not diaphoretic. No erythema.  Psychiatric: She has a normal mood and affect.  Nursing note and vitals reviewed.    ED Treatments / Results  Labs (all labs ordered are listed, but only abnormal results are displayed) Labs Reviewed  BASIC METABOLIC PANEL - Abnormal; Notable for the following components:      Result Value   Sodium 133 (*)    Chloride 96 (*)    Glucose, Bld 134 (*)    Calcium 8.7 (*)    All other components within normal limits  CBC - Abnormal; Notable for the following components:   RBC 3.50 (*)    Hemoglobin 10.3 (*)    HCT 32.0 (*)    All other components within normal limits  EXPECTORATED SPUTUM ASSESSMENT W REFEX TO RESP CULTURE  BRAIN NATRIURETIC PEPTIDE  BASIC METABOLIC PANEL  I-STAT TROPONIN, ED  I-STAT TROPONIN, ED    EKG EKG Interpretation  Date/Time:  Friday April 17 2018 17:43:35 EST Ventricular Rate:  88 PR Interval:    QRS Duration: 110 QT Interval:  405 QTC Calculation: 490 R Axis:   41 Text Interpretation:  Sinus rhythm Low voltage, precordial leads Nonspecific T abnormalities, lateral leads Borderline prolonged QT interval When comapred to prior, more artifact.  No STEMI Confirmed by Theda Belfast ([id number]) on 04/17/2018 6:09:16 PM   Radiology Dg Chest 2 View  Result Date: 04/17/2018 CLINICAL DATA:  Wheezing, shortness of breath and cough for 2 days. History of diabetes and hypertension. EXAM: CHEST - 2 VIEW COMPARISON:  11/11/2017  radiographs.  CT 11/07/2017. FINDINGS: Stable mild cardiomegaly and aortic atherosclerosis. There are low lung volumes with vascular congestion, but no overt pulmonary edema or confluent airspace opacity. Nerve stimulator generator projects over the left anterior chest with wires leading into the left base of neck. No acute osseous findings are seen. IMPRESSION: No acute cardiopulmonary process. Aortic atherosclerosis and vascular congestion. Electronically Signed   By: Carey Bullocks M.D.   On: 04/17/2018 19:38    Procedures Procedures (including critical care time)  CRITICAL CARE Performed by: Canary Brim  Total critical care time: 35 minutes Critical care time was exclusive of separately billable procedures and treating other patients. Critical care was necessary to treat or prevent imminent or life-threatening deterioration. Critical care was time spent personally by me on the following activities: development of treatment plan with patient and/or surrogate as well as nursing, discussions with consultants, evaluation of patient's response to treatment, examination of patient, obtaining history from patient or surrogate, ordering and performing treatments and interventions, ordering and review of laboratory studies, ordering and review of radiographic studies, pulse oximetry and re-evaluation of patient's condition.   Medications Ordered in ED Medications  amLODipine (NORVASC) tablet 5 mg (has no administration in time range)  atorvastatin (LIPITOR) tablet 40 mg (has no administration in time range)  furosemide (LASIX) tablet 40 mg (has no administration in time range)  irbesartan (AVAPRO) tablet 300 mg (has no administration in time range)  donepezil (ARICEPT) tablet 10 mg (10 mg Oral Given 04/18/18 0211)  LORazepam (ATIVAN) tablet 1 mg (has no administration in time range)  QUEtiapine (SEROQUEL) tablet 150 mg (150 mg Oral Given 04/18/18 0211)  QUEtiapine (SEROQUEL) tablet 125 mg  (has no administration in time range)  alum & mag hydroxide-simeth (MAALOX/MYLANTA) 200-200-20 MG/5ML suspension 30 mL (has no administration in time range)  dicyclomine (BENTYL) capsule 10 mg (10 mg Oral Given 04/18/18 0212)  pantoprazole (PROTONIX) EC tablet 40  mg (has no administration in time range)  famotidine (PEPCID) tablet 20 mg (20 mg Oral Given 04/18/18 0212)  senna-docusate (Senokot-S) tablet 1 tablet (1 tablet Oral Given 04/18/18 0212)  clopidogrel (PLAVIX) tablet 75 mg (has no administration in time range)  lamoTRIgine (LAMICTAL) tablet 200 mg (200 mg Oral Given 04/18/18 0211)  mometasone-formoterol (DULERA) 200-5 MCG/ACT inhaler 2 puff (2 puffs Inhalation Not Given 04/17/18 2250)  guaiFENesin-dextromethorphan (ROBITUSSIN DM) 100-10 MG/5ML syrup 5 mL (5 mLs Oral Given 04/18/18 0213)  ipratropium-albuterol (DUONEB) 0.5-2.5 (3) MG/3ML nebulizer solution 3 mL (3 mLs Nebulization Given 04/17/18 2240)  montelukast (SINGULAIR) tablet 10 mg (has no administration in time range)  enoxaparin (LOVENOX) injection 40 mg (40 mg Subcutaneous Given 04/18/18 0214)  sodium chloride flush (NS) 0.9 % injection 3 mL (3 mLs Intravenous Given 04/18/18 0215)  sodium chloride flush (NS) 0.9 % injection 3 mL (has no administration in time range)  0.9 %  sodium chloride infusion (has no administration in time range)  acetaminophen (TYLENOL) tablet 650 mg (has no administration in time range)    Or  acetaminophen (TYLENOL) suppository 650 mg (has no administration in time range)  magnesium hydroxide (MILK OF MAGNESIA) suspension 30 mL (has no administration in time range)  ondansetron (ZOFRAN) tablet 4 mg (has no administration in time range)    Or  ondansetron (ZOFRAN) injection 4 mg (has no administration in time range)  azithromycin (ZITHROMAX) 500 mg in sodium chloride 0.9 % 250 mL IVPB (has no administration in time range)  albuterol (PROVENTIL) (2.5 MG/3ML) 0.083% nebulizer solution 2.5 mg (has no  administration in time range)  methylPREDNISolone sodium succinate (SOLU-MEDROL) 125 mg/2 mL injection 60 mg (60 mg Intravenous Given 04/18/18 0213)  albuterol (PROVENTIL) (2.5 MG/3ML) 0.083% nebulizer solution 5 mg (5 mg Nebulization Given 04/17/18 1756)  ipratropium-albuterol (DUONEB) 0.5-2.5 (3) MG/3ML nebulizer solution 3 mL (3 mLs Nebulization Given 04/17/18 2004)  magnesium sulfate IVPB 2 g 50 mL (0 g Intravenous Stopped 04/17/18 2003)  LORazepam (ATIVAN) tablet 1 mg (1 mg Oral Given 04/17/18 2204)     Initial Impression / Assessment and Plan / ED Course  I have reviewed the triage vital signs and the nursing notes.  Pertinent labs & imaging results that were available during my care of the patient were reviewed by me and considered in my medical decision making (see chart for details).     Cindy Padilla is a 77 y.o. female with a past medical history significant for bipolar disorder, CHF, hypertension, dementia, GERD, diabetes, and COPD who presents with chills, congestion, cough, shortness of breath, and wheezing.  Patient reports that her last several days she has had worsened rhinorrhea, congestion, nonproductive cough, and wheezing.  She says she is using her albuterol inhaler as many times as she can and then today got to the point where she thought she was going to pass out from her breathing.  She denies significant chest pain or palpitations.  She says that she has been wheezing nonstop.  She called EMS who provided her with a DuoNeb and Solu-Medrol.  Patient reports she still having wheezing and difficulty breathing.  She reports that she is having significant cough but cannot get anything up.  She denies hemoptysis.  She denies chest pain or chest pressure.  She reports her legs are always swollen and she has cellulitis managed by her primary doctor.  She reports no changes with this.  She denies recent medication changes.  Next  On exam, patient has  wheezing in all lung  fields.  Patient also has crackles in the bases.  Patient has pitting edema both her legs.  Patient's chest is nontender.  Abdomen is nontender.  Patient will be given a DuoNeb treatment as well as magnesium.  Patient is already received steroids.  She will have blood work including troponin, BNP due to history of heart failure with edema in the legs and crackles on lungs.  Patient will be ambulated to see what her pulse ox is.  When patient spoke with me her oxygen saturations dropped in the low 90s on room air.  Anticipate reassessment after work-up.  9:52 PM Troponin negative x2.  BMP reassuring.  CBC shows no leukocytosis and similar anemia to prior.  Chest x-ray shows no pneumonia.  Next  Patient was hypoxic and placed on 2 L nasal cannula.  Despite magnesium, steroids, and several breathing treatments, patient still having wheezing and now has hypoxia.  I suspect COPD exacerbation and as she is on oxygen will need admission.  BNP not elevated.  Hospitalist team called for admission for COPD exacerbation.  Final Clinical Impressions(s) / ED Diagnoses   Final diagnoses:  COPD exacerbation [facility](HCC)    ED Discharge Orders    None     Clinical Impression: 1. COPD exacerbation (HCC)     Disposition: Admit  This note was prepared with assistance of Dragon voice recognition software. Occasional wrong-word or sound-a-like substitutions may have occurred due to the inherent limitations of voice recognition software.     , Canary Brimhristopher J, MD 04/18/18 [phone]

## 2018-04-17 NOTE — H&P (Signed)
History and Physical    Cindy Padilla:[id number] DOB: [birth year] DOA: 04/17/2018  PCP: Ron Parker, MD   Patient coming from: ALF    Chief Complaint: Cough and wheeze   HPI: Cindy Padilla is a 77 y.o. female with medical history significant for bipolar disorder, COPD, mild dementia, hypertension, and chronic venous stasis, now presenting to the emergency department for evaluation of increased cough and wheezing.  Patient reports some rhinorrhea that developed several days ago and was followed closely by increased cough and wheezing.  She states that her cough has never been productive, but has worsened in severity over the last couple days.  She has also had significant wheezing over this interval and some difficulty sleeping the past 2 nights due to shortness of breath.  She denies any chest pain or palpitations.  She denies fevers or chills.  She reports chronic leg swelling with some ulceration that has been managed with compression wraps and antibiotic ointment.  She called EMS and was treated with nebs and 125 mg IV Solu-Medrol prior to arrival in the ED.  ED Course: Upon arrival to the ED, patient is found to be afebrile, saturating low 90s on room air, slightly tachypneic, and with stable blood pressure.  EKG features a sinus rhythm with nonspecific T wave abnormality in the lateral leads.  Chest x-ray is notable for some vascular congestion but no acute finding.  Chemistry panel features a mild hyponatremia and CBC is notable for a chronic stable normocytic anemia.  Troponin is negative and BNP is normal.  Patient was given a continuous albuterol treatment, DuoNeb, IV magnesium, and Ativan in the ED.  She reports some improvement, but remains dyspneic at rest and unable to ambulate more than a few steps.  She will be observed for ongoing evaluation and management of acute exacerbation in COPD.  Review of Systems:  All other systems reviewed and apart from HPI, are  negative.  Past Medical History:  Diagnosis Date  . Arthritis   . Bipolar 1 disorder (HCC)   . Bronchitis   . Chronic back pain   . Constipation   . COPD (chronic obstructive pulmonary disease) (HCC)   . Dementia (HCC)   . Diabetes mellitus   . GERD (gastroesophageal reflux disease)   . Hiatal hernia   . Hypertension   . IBS (irritable bowel syndrome)     Past Surgical History:  Procedure Laterality Date  . gallbladder    . MANDIBLE SURGERY    . PARTIAL HYSTERECTOMY       reports that she quit smoking about 58 years ago. Her smoking use included cigarettes. She has a 1.00 pack-year smoking history. She quit smokeless tobacco use about 58 years ago.  Her smokeless tobacco use included snuff. She reports that she does not drink alcohol or use drugs.  Allergies  Allergen Reactions  . Aminoglycosides Other (See Comments)    Reaction:  Unknown   . Aspirin Other (See Comments)    Reaction:  Unknown   . Codeine Other (See Comments)    Reaction:  Unknown   . Ivp Dye [Iodinated Diagnostic Agents] Other (See Comments)    Reaction:  Unknown   . Morphine And Related Other (See Comments)    Reaction:  Unknown   . Neomycin Other (See Comments)    Reaction:  Unknown   . Penicillins Rash and Other (See Comments)    Has patient had a PCN reaction causing immediate rash, facial/tongue/throat swelling, SOB or lightheadedness  with hypotension: No Has patient had a PCN reaction causing severe rash involving mucus membranes or skin necrosis: No Has patient had a PCN reaction that required hospitalization No Has patient had a PCN reaction occurring within the last 10 years: No If all of the above answers are "NO", then may proceed with Cephalosporin use.  Marland Kitchen Phenothiazines Other (See Comments)    Reaction:  Unknown   . Promethazine Other (See Comments)    Reaction:  Unknown   . Sulfa Antibiotics Other (See Comments)    Reaction:  Unknown   . Tetracyclines & Related Other (See Comments)      Reaction: Unknown     Family History  Problem Relation Age of Onset  . Emphysema Mother   . Cancer Other      Prior to Admission medications   Medication Sig Start Date End Date Taking? Authorizing Provider  acetaminophen (TYLENOL) 325 MG tablet Take 650 mg by mouth 3 (three) times daily.   Yes [provider]  amLODipine (NORVASC) 5 MG tablet Take 5 mg by mouth 2 (two) times daily.    Yes [provider]  antiseptic oral rinse (BIOTENE) LIQD 15 mLs by Mouth Rinse route at bedtime. Swish and spit   Yes [provider]  atorvastatin (LIPITOR) 40 MG tablet Take 40 mg by mouth daily.    Yes [provider]  budesonide-formoterol (SYMBICORT) 160-4.5 MCG/ACT inhaler Inhale 2 puffs into the lungs 2 (two) times daily.    Yes [provider]  cholecalciferol (VITAMIN D) 1000 UNITS tablet Take 1,000 Units by mouth daily.    Yes [provider]  clopidogrel (PLAVIX) 75 MG tablet Take 75 mg by mouth daily.    Yes [provider]  dicyclomine (BENTYL) 10 MG capsule Take 10 mg by mouth 4 (four) times daily.   Yes [provider]  donepezil (ARICEPT) 10 MG tablet Take 10 mg by mouth at bedtime.   Yes [provider]  esomeprazole (NEXIUM) 40 MG capsule Take 40 mg by mouth daily before breakfast.   Yes [provider]  ferrous sulfate 325 (65 FE) MG tablet Take 325 mg by mouth daily with breakfast.   Yes [provider]  fluticasone (FLONASE) 50 MCG/ACT nasal spray Place 2 sprays into both nostrils daily.    Yes [provider]  furosemide (LASIX) 40 MG tablet Take 1 tablet (40 mg total) by mouth daily. 11/12/17  Yes Delo, Riley Lam, MD  guaiFENesin-dextromethorphan (ROBITUSSIN DM) 100-10 MG/5ML syrup Take 5 mLs by mouth every 4 (four) hours as needed for cough. 03/04/17  Yes [name], MD  ipratropium-albuterol (DUONEB) 0.5-2.5 (3) MG/3ML SOLN Take 3 mLs by nebulization 4 (four) times daily.  03/04/17  Yes [name], MD  lamoTRIgine (LAMICTAL) 200 MG tablet Take 200 mg by mouth 2 (two) times daily.   Yes [provider]  magnesium oxide (MAG-OX) 400 (241.3 Mg) MG tablet Take 400 mg by mouth daily.   Yes [provider]  miconazole (BAZA ANTIFUNGAL) 2 % cream Apply 1 application topically daily.   Yes [provider]  montelukast (SINGULAIR) 10 MG tablet Take 10 mg by mouth at bedtime.   Yes [provider]  QUEtiapine (SEROQUEL) 100 MG tablet Take 150 mg by mouth at bedtime.    Yes [provider]  QUEtiapine (SEROQUEL) 50 MG tablet Take 125 mg by mouth every morning.   Yes [provider]  senna-docusate (SENOKOT-S) 8.6-50 MG per tablet Take 1 tablet  by mouth 2 (two) times daily.   Yes [provider]  valsartan (DIOVAN) 320 MG tablet Take 1 tablet (320 mg total) by mouth daily. 04/16/13  Yes Nyoka Cowden, MD  albuterol (PROVENTIL HFA;VENTOLIN HFA) 108 (90 BASE) MCG/ACT inhaler Inhale 2 puffs into the lungs every 4 (four) hours as needed for wheezing or shortness of breath.    [provider]  alum & mag hydroxide-simeth (GERI-LANTA) 200-200-20 MG/5ML suspension Take 30 mLs by mouth 4 (four) times daily as needed for indigestion or heartburn.     [provider]  lidocaine (ASPERCREME W/LIDOCAINE) 4 % cream Apply 1 application topically 3 (three) times daily as needed (for pain).     [provider]  loperamide (IMODIUM) 2 MG capsule Take 2 mg by mouth every 3 (three) hours as needed for diarrhea or loose stools.     [provider]  LORazepam (ATIVAN) 1 MG tablet Take 1 tablet (1 mg total) by mouth every 12 (twelve) hours. Patient taking differently: Take 1 mg by mouth 2 (two) times daily as needed for anxiety.  03/04/17   [name], MD  magnesium hydroxide (MILK OF MAGNESIA) 400 MG/5ML suspension Take 30 mLs by mouth at bedtime as needed for mild constipation.    [provider]  mupirocin cream (BACTROBAN) 2 % Apply 1 application topically 2 (two) times daily. Patient taking differently: Apply 1 application topically 2 (two) times daily as needed (wound card).  11/12/17   Geoffery Lyons, MD  phenylephrine-shark liver oil-mineral oil-petrolatum (PREPARATION H) 0.25-3-14-71.9 % rectal ointment Place 1 application rectally 2 (two) times daily as needed for hemorrhoids.    [provider]  ranitidine (ZANTAC) 150 MG tablet Take 150 mg by mouth at bedtime.     [provider]  triamcinolone cream (KENALOG) 0.1 % Apply 1 application topically daily as needed (for irritation).     [provider]  Vitamins A & D (VITAMIN A & D) ointment Apply 1 application topically every 6 (six) hours as needed (for rectal pain).     [provider]    Physical Exam: Vitals:   04/17/18 1718 04/17/18 1831 04/17/18 2002 04/17/18 2200  BP:  (!) 176/76 (!) 142/88 (!) 153/61  Pulse:  95 93 92  Resp:  (!) 21 15 20   Temp:      TempSrc:      SpO2:  94% 94% 95%  Weight: 81.6 kg     Height: 5' (1.524 m)       Constitutional: NAD, calm  Eyes: PERTLA, lids and conjunctivae normal ENMT: Mucous membranes are moist. Posterior pharynx clear of any exudate or lesions.   Neck: normal, supple, no masses, no thyromegaly Respiratory: Mild tachypnea and dyspneic with speech. Diminished breath sounds with prolonged expiratory phase. No accessory muscle use.  Cardiovascular: S1 & S2 heard, regular rate and rhythm. Pretibial pitting edema bilaterally. Abdomen: No distension, no tenderness, soft. Bowel sounds normal.  Musculoskeletal: no clubbing / cyanosis. No joint deformity upper and lower extremities.   Skin: no significant rashes, lesions, ulcers. Warm, dry, well-perfused. Neurologic: CN 2-12 grossly intact. Resting tremor. Sensation intact. Strength 5/5 in all 4 limbs.  Psychiatric: Alert and oriented to person, place, and situation. Pleasant,  cooperative.    Labs on Admission: I have personally reviewed following labs and imaging studies  CBC: Recent Labs  Lab 04/17/18 1754  WBC 6.3  HGB 10.3*  HCT 32.0*  MCV 91.4  PLT 197   Basic  Metabolic Panel: Recent Labs  Lab 04/17/18 1754  NA 133*  K 4.0  CL 96*  CO2 26  GLUCOSE 134*  BUN 16  CREATININE 0.81  CALCIUM 8.7*   GFR: Estimated Creatinine Clearance: 54.1 mL/min (by C-G formula based on SCr of 0.81 mg/dL). Liver Function Tests: No results for input(s): AST, ALT, ALKPHOS, BILITOT, PROT, ALBUMIN in the last 168 hours. No results for input(s): LIPASE, AMYLASE in the last 168 hours. No results for input(s): AMMONIA in the last 168 hours. Coagulation Profile: No results for input(s): INR, PROTIME in the last 168 hours. Cardiac Enzymes: No results for input(s): CKTOTAL, CKMB, CKMBINDEX, TROPONINI in the last 168 hours. BNP (last 3 results) No results for input(s): PROBNP in the last 8760 hours. HbA1C: No results for input(s): HGBA1C in the last 72 hours. CBG: No results for input(s): GLUCAP in the last 168 hours. Lipid Profile: No results for input(s): CHOL, HDL, LDLCALC, TRIG, CHOLHDL, LDLDIRECT in the last 72 hours. Thyroid Function Tests: No results for input(s): TSH, T4TOTAL, FREET4, T3FREE, THYROIDAB in the last 72 hours. Anemia Panel: No results for input(s): VITAMINB12, FOLATE, FERRITIN, TIBC, IRON, RETICCTPCT in the last 72 hours. Urine analysis:    Component Value Date/Time   COLORURINE COLORLESS (A) 03/30/2017 1813   APPEARANCEUR CLEAR 03/30/2017 1813   LABSPEC 1.003 (L) 03/30/2017 1813   PHURINE 7.0 03/30/2017 1813   GLUCOSEU NEGATIVE 03/30/2017 1813   HGBUR NEGATIVE 03/30/2017 1813   BILIRUBINUR NEGATIVE 03/30/2017 1813   KETONESUR NEGATIVE 03/30/2017 1813   PROTEINUR NEGATIVE 03/30/2017 1813   UROBILINOGEN 0.2 09/19/2014 1633   NITRITE NEGATIVE 03/30/2017 1813   LEUKOCYTESUR NEGATIVE 03/30/2017 1813   Sepsis  Labs: @LABRCNTIP (procalcitonin:4,lacticidven:4) )No results found for this or any previous visit (from the past 240 hour(s)).   Radiological Exams on Admission: Dg Chest 2 View  Result Date: 04/17/2018 CLINICAL DATA:  Wheezing, shortness of breath and cough for 2 days. History of diabetes and hypertension. EXAM: CHEST - 2 VIEW COMPARISON:  11/11/2017 radiographs.  CT 11/07/2017. FINDINGS: Stable mild cardiomegaly and aortic atherosclerosis. There are low lung volumes with vascular congestion, but no overt pulmonary edema or confluent airspace opacity. Nerve stimulator generator projects over the left anterior chest with wires leading into the left base of neck. No acute osseous findings are seen. IMPRESSION: No acute cardiopulmonary process. Aortic atherosclerosis and vascular congestion. Electronically Signed   By: Carey BullocksWilliam  Veazey M.D.   On: 04/17/2018 19:38    EKG: Independently reviewed. Sinus rhythm, non-specific T-wave abnormality in lateral leads.   Assessment/Plan  1. COPD with acute exacerbation  - Presents with SOB, cough, and wheezing  - CXR is clear, afebrile, no leukocytosis  - Treated with 125 mg IV Solu-Medrol and nebs by EMS, then continuous albuterol tx and DuoNeb in ED but remains dyspneic at rest  - Check sputum culture, continue systemic steroid, start azithromycin, continue ICS/LABA, DuoNebs, and as-needed albuterol nebs    2. Chronic diastolic CHF  - Appears compensated  - Continue Lasix and ARB    3. Bipolar disorder  - Continue Lamictal, Seroquel, as-needed Ativan    4. Dementia  - Continue Aricept    5. Hypertension  - BP at goal  - Continue ARB and Norvasc  6. Chronic venous stasis  - Continue compression, elevation     DVT prophylaxis: Lovenox  Code Status: DNR  Family Communication: Discussed with patient  Consults called: None Admission status: observation     Briscoe Deutscherimothy S Buck Mcaffee, MD [facility]  Pager [phone]  If 7PM-7AM, please  contact night-coverage [email or web address] Password [email or web address]  04/17/2018, 10:20 PM

## 2018-04-17 NOTE — ED Triage Notes (Signed)
Per EMS-SOB, wheezing and cough for 2 days-vitals normal-95% on RA-albuterol 5 mg and Atrovent .5mg  x2 given in rout-125 mg of solumedrol given in route-patient has history of dementia but a baseline mentally-20g left forearm

## 2018-04-17 NOTE — ED Notes (Signed)
pts oxygen dropped to 89% while walking. Pt stopped and coughed and oxygen came back up to 93%

## 2018-04-18 ENCOUNTER — Other Ambulatory Visit: Payer: Self-pay

## 2018-04-18 DIAGNOSIS — K219 Gastro-esophageal reflux disease without esophagitis: Secondary | ICD-10-CM | POA: Diagnosis present

## 2018-04-18 DIAGNOSIS — Z885 Allergy status to narcotic agent status: Secondary | ICD-10-CM | POA: Diagnosis not present

## 2018-04-18 DIAGNOSIS — E871 Hypo-osmolality and hyponatremia: Secondary | ICD-10-CM | POA: Diagnosis present

## 2018-04-18 DIAGNOSIS — Z888 Allergy status to other drugs, medicaments and biological substances status: Secondary | ICD-10-CM | POA: Diagnosis not present

## 2018-04-18 DIAGNOSIS — Z7951 Long term (current) use of inhaled steroids: Secondary | ICD-10-CM | POA: Diagnosis not present

## 2018-04-18 DIAGNOSIS — D649 Anemia, unspecified: Secondary | ICD-10-CM | POA: Diagnosis present

## 2018-04-18 DIAGNOSIS — I878 Other specified disorders of veins: Secondary | ICD-10-CM | POA: Diagnosis present

## 2018-04-18 DIAGNOSIS — J441 Chronic obstructive pulmonary disease with (acute) exacerbation: Secondary | ICD-10-CM | POA: Diagnosis present

## 2018-04-18 DIAGNOSIS — Z79899 Other long term (current) drug therapy: Secondary | ICD-10-CM | POA: Diagnosis not present

## 2018-04-18 DIAGNOSIS — Z7902 Long term (current) use of antithrombotics/antiplatelets: Secondary | ICD-10-CM | POA: Diagnosis not present

## 2018-04-18 DIAGNOSIS — F0391 Unspecified dementia with behavioral disturbance: Secondary | ICD-10-CM | POA: Diagnosis present

## 2018-04-18 DIAGNOSIS — I110 Hypertensive heart disease with heart failure: Secondary | ICD-10-CM | POA: Diagnosis present

## 2018-04-18 DIAGNOSIS — Z880 Allergy status to penicillin: Secondary | ICD-10-CM | POA: Diagnosis not present

## 2018-04-18 DIAGNOSIS — I5032 Chronic diastolic (congestive) heart failure: Secondary | ICD-10-CM | POA: Diagnosis present

## 2018-04-18 DIAGNOSIS — Z886 Allergy status to analgesic agent status: Secondary | ICD-10-CM | POA: Diagnosis not present

## 2018-04-18 DIAGNOSIS — Z91041 Radiographic dye allergy status: Secondary | ICD-10-CM | POA: Diagnosis not present

## 2018-04-18 DIAGNOSIS — F319 Bipolar disorder, unspecified: Secondary | ICD-10-CM | POA: Diagnosis present

## 2018-04-18 DIAGNOSIS — Z66 Do not resuscitate: Secondary | ICD-10-CM | POA: Diagnosis present

## 2018-04-18 DIAGNOSIS — E119 Type 2 diabetes mellitus without complications: Secondary | ICD-10-CM | POA: Diagnosis present

## 2018-04-18 DIAGNOSIS — Z87891 Personal history of nicotine dependence: Secondary | ICD-10-CM | POA: Diagnosis not present

## 2018-04-18 LAB — BASIC METABOLIC PANEL
Anion gap: 12 (ref 5–15)
BUN: 13 mg/dL (ref 8–23)
CALCIUM: 8.9 mg/dL (ref 8.9–10.3)
CO2: 24 mmol/L (ref 22–32)
CREATININE: 0.77 mg/dL (ref 0.44–1.00)
Chloride: 97 mmol/L — ABNORMAL LOW (ref 98–111)
GFR calc non Af Amer: >60 mL/min (ref >60)
Glucose, Bld: 218 mg/dL — ABNORMAL HIGH (ref 70–99)
Potassium: 4.2 mmol/L (ref 3.5–5.1)
SODIUM: 133 mmol/L — AB (ref 135–145)

## 2018-04-18 LAB — GLUCOSE, CAPILLARY: GLUCOSE-CAPILLARY: 138 mg/dL — AB (ref 70–99)

## 2018-04-18 MED ORDER — ORAL CARE MOUTH RINSE
15.0000 mL | Freq: Two times a day (BID) | OROMUCOSAL | Status: DC
  Administered 2018-04-18 – 2018-04-20 (×5): 15 mL via OROMUCOSAL

## 2018-04-18 MED ORDER — IPRATROPIUM-ALBUTEROL 0.5-2.5 (3) MG/3ML IN SOLN: 3.0000 mL | Freq: Four times a day (QID) | RESPIRATORY_TRACT | Status: DC

## 2018-04-18 MED ORDER — IPRATROPIUM-ALBUTEROL 0.5-2.5 (3) MG/3ML IN SOLN
3.0000 mL | Freq: Four times a day (QID) | RESPIRATORY_TRACT | Status: DC
  Administered 2018-04-18 – 2018-04-19 (×5): 3 mL via RESPIRATORY_TRACT
  Filled 2018-04-18 (×5): qty 3

## 2018-04-18 NOTE — Progress Notes (Signed)
PROGRESS NOTE    Cindy Padilla  WUJ:[id number] DOB: 07-12-1940 DOA: 04/17/2018 PCP: Ron Parker, MD  Outpatient Specialists:    Brief Narrative:  Cindy Padilla is a 77 y.o. female with medical history significant for bipolar disorder, COPD, mild dementia, hypertension, and chronic venous stasis, now presenting to the emergency department for evaluation of increased cough and wheezing.  Patient is a poor historian.  Chest x-ray is said to reveal vascular congestion.  Cardiac BNP is within normal range.  Last echo was in September 2014, and revealed grade 1 diastolic dysfunction.  No recent echocardiogram visualized.   Assessment & Plan:   Principal Problem:   COPD with acute exacerbation (HCC) Active Problems:   Hyponatremia   HTN (hypertension)   Chronic diastolic CHF (congestive heart failure) (HCC)   Dementia with behavioral disturbance (HCC)   Bipolar 1 disorder (HCC)   Normocytic anemia  1. COPD with acute exacerbation  - Presents with SOB, cough, and wheezing  - CXR is clear, afebrile, no leukocytosis  - Treated with 125 mg IV Solu-Medrol and nebs by EMS, then continuous albuterol tx and DuoNeb in ED but remains dyspneic at rest  - Check sputum culture, continue systemic steroid, start azithromycin, continue ICS/LABA, DuoNebs, and as-needed albuterol nebs   04/18/2018: Continue current management.  2. Chronic diastolic CHF  - Appears compensated  - Continue Lasix and ARB   04/18/2018: Continue current medication.  Monitor cardiac status closely.  3. Bipolar disorder  - Continue Lamictal, Seroquel, as-needed Ativan    4. Dementia  - Continue Aricept    5. Hypertension  - BP at goal  - Continue ARB and Norvasc  6. Chronic venous stasis  - Continue compression, elevation     DVT prophylaxis: Lovenox  Code Status: DNR  Family Communication: Discussed with patient  Consults called: None  Procedures:   None  Antimicrobials:    Azithromycin   Subjective: Shortness of breath and wheezing are improving. Patient is a poor historian.  Objective: Vitals:   04/18/18 0300 04/18/18 0413 04/18/18 0717 04/18/18 1030  BP: (!) 148/82 (!) 152/62  (!) 160/66  Pulse:  91  89  Resp:  18    Temp:  98.2 F (36.8 C)    TempSrc:  Oral    SpO2:  97% 96% 96%  Weight: 86.3 kg     Height: 5' (1.524 m)       Intake/Output Summary (Last 24 hours) at 04/18/2018 1241 Last data filed at 04/18/2018 0844 Gross per 24 hour  Intake 410 ml  Output -  Net 410 ml   Filed Weights   04/17/18 1718 04/18/18 0300  Weight: 81.6 kg 86.3 kg    Examination:  General exam: Appears calm and comfortable.  Obese. Respiratory system: Decreased air entry.  Expiratory wheeze.   Cardiovascular system: S1 & S2 heard.  Gastrointestinal system: Abdomen is obese, soft and nontender. No organomegaly or masses felt. Normal bowel sounds heard. Central nervous system: Alert and oriented. No focal neurological deficits. Extremities: Unna boots to the lower legs.  Data Reviewed: I have personally reviewed following labs and imaging studies  CBC: Recent Labs  Lab 04/17/18 1754  WBC 6.3  HGB 10.3*  HCT 32.0*  MCV 91.4  PLT 197   Basic Metabolic Panel: Recent Labs  Lab 04/17/18 1754 04/18/18 0504  NA 133* 133*  K 4.0 4.2  CL 96* 97*  CO2 26 24  GLUCOSE 134* 218*  BUN 16 13  CREATININE 0.81  0.77  CALCIUM 8.7* 8.9   GFR: Estimated Creatinine Clearance: 56.5 mL/min (by C-G formula based on SCr of 0.77 mg/dL). Liver Function Tests: No results for input(s): AST, ALT, ALKPHOS, BILITOT, PROT, ALBUMIN in the last 168 hours. No results for input(s): LIPASE, AMYLASE in the last 168 hours. No results for input(s): AMMONIA in the last 168 hours. Coagulation Profile: No results for input(s): INR, PROTIME in the last 168 hours. Cardiac Enzymes: No results for input(s): CKTOTAL, CKMB, CKMBINDEX, TROPONINI in the last 168 hours. BNP (last  3 results) No results for input(s): PROBNP in the last 8760 hours. HbA1C: No results for input(s): HGBA1C in the last 72 hours. CBG: Recent Labs  Lab 04/18/18 0748  GLUCAP 138*   Lipid Profile: No results for input(s): CHOL, HDL, LDLCALC, TRIG, CHOLHDL, LDLDIRECT in the last 72 hours. Thyroid Function Tests: No results for input(s): TSH, T4TOTAL, FREET4, T3FREE, THYROIDAB in the last 72 hours. Anemia Panel: No results for input(s): VITAMINB12, FOLATE, FERRITIN, TIBC, IRON, RETICCTPCT in the last 72 hours. Urine analysis:    Component Value Date/Time   COLORURINE COLORLESS (A) 03/30/2017 1813   APPEARANCEUR CLEAR 03/30/2017 1813   LABSPEC 1.003 (L) 03/30/2017 1813   PHURINE 7.0 03/30/2017 1813   GLUCOSEU NEGATIVE 03/30/2017 1813   HGBUR NEGATIVE 03/30/2017 1813   BILIRUBINUR NEGATIVE 03/30/2017 1813   KETONESUR NEGATIVE 03/30/2017 1813   PROTEINUR NEGATIVE 03/30/2017 1813   UROBILINOGEN 0.2 09/19/2014 1633   NITRITE NEGATIVE 03/30/2017 1813   LEUKOCYTESUR NEGATIVE 03/30/2017 1813   Sepsis Labs: @LABRCNTIP (procalcitonin:4,lacticidven:4)  )No results found for this or any previous visit (from the past 240 hour(s)).       Radiology Studies: Dg Chest 2 View  Result Date: 04/17/2018 CLINICAL DATA:  Wheezing, shortness of breath and cough for 2 days. History of diabetes and hypertension. EXAM: CHEST - 2 VIEW COMPARISON:  11/11/2017 radiographs.  CT 11/07/2017. FINDINGS: Stable mild cardiomegaly and aortic atherosclerosis. There are low lung volumes with vascular congestion, but no overt pulmonary edema or confluent airspace opacity. Nerve stimulator generator projects over the left anterior chest with wires leading into the left base of neck. No acute osseous findings are seen. IMPRESSION: No acute cardiopulmonary process. Aortic atherosclerosis and vascular congestion. Electronically Signed   By: Carey BullocksWilliam  Veazey M.D.   On: 04/17/2018 19:38        Scheduled Meds: .  amLODipine  5 mg Oral BID  . atorvastatin  40 mg Oral q1800  . clopidogrel  75 mg Oral Daily  . dicyclomine  10 mg Oral QID  . donepezil  10 mg Oral QHS  . enoxaparin (LOVENOX) injection  40 mg Subcutaneous Q24H  . famotidine  20 mg Oral QHS  . furosemide  40 mg Oral Daily  . ipratropium-albuterol  3 mL Nebulization QID  . irbesartan  300 mg Oral Daily  . lamoTRIgine  200 mg Oral BID  . mouth rinse  15 mL Mouth Rinse BID  . methylPREDNISolone (SOLU-MEDROL) injection  60 mg Intravenous Q6H  . mometasone-formoterol  2 puff Inhalation BID  . montelukast  10 mg Oral QHS  . pantoprazole  40 mg Oral Daily  . QUEtiapine  125 mg Oral Daily  . QUEtiapine  150 mg Oral QHS  . senna-docusate  1 tablet Oral BID  . sodium chloride flush  3 mL Intravenous Q12H   Continuous Infusions: . sodium chloride 250 mL (04/18/18 0352)  . azithromycin 500 mg (04/18/18 0421)     LOS: 0 days  Time spent: 35 minutes.    Dana Allan, MD  [facility] Pager #: [phone] 7PM-7AM contact night coverage as above

## 2018-04-18 NOTE — ED Notes (Signed)
ED TO INPATIENT HANDOFF REPORT  Name/Age/Gender Cindy Padilla 77 y.o. female  Code Status    Code Status Orders  (From admission, onward)         Start     Ordered   04/17/18 2219  Do not attempt resuscitation (DNR)  Continuous    Question Answer Comment  In the event of cardiac or respiratory ARREST Do not call a "code blue"   In the event of cardiac or respiratory ARREST Do not perform Intubation, CPR, defibrillation or ACLS   In the event of cardiac or respiratory ARREST Use medication by any route, position, wound care, and other measures to relive pain and suffering. May use oxygen, suction and manual treatment of airway obstruction as needed for comfort.      04/17/18 2220        Code Status History    Date Active Date Inactive Code Status Order ID Comments User Context   06/26/2017 1118 06/26/2017 1822 DNR [id number]  Orlie Dakin, MD ED   02/25/2017 1029 03/04/2017 1928 DNR [id number]  Elmarie Shiley, MD Inpatient   02/24/2017 2312 02/25/2017 1029 Full Code [id number]  Karmen Bongo, MD Inpatient   06/11/2016 0302 06/16/2016 1914 DNR [id number]  Rise Patience, MD Inpatient   04/23/2014 0933 04/24/2014 1927 DNR [id number]  Louellen Molder, MD Inpatient   04/22/2014 1847 04/23/2014 0933 Full Code [id number]  Louellen Molder, MD Inpatient   07/24/2013 2014 07/27/2013 2327 DNR [id number]  Clinton Gallant, MD Inpatient   04/19/2013 0337 04/19/2013 1633 Full Code [id number]  Jamse Mead, PA-C ED   02/21/2013 1759 02/24/2013 1622 DNR [id number]  Thurnell Lose, MD Inpatient   02/09/2013 1339 02/11/2013 1936 Full Code [id number]  Erline Hau, MD Inpatient      Home/SNF/Other Given to floor  Chief Complaint Shortness of Breath  Level of Care/Admitting Diagnosis ED Disposition    ED Disposition Condition [place]: [facility] [[id number]]  Level of Care: Med-Surg [16]  Diagnosis: COPD with acute exacerbation [facility])  [[id number]]  Admitting Physician: Vianne Bulls [[id number]]  Attending Physician: Vianne Bulls [[id number]]  PT Class (Do Not Modify): Observation [104]  PT Acc Code (Do Not Modify): Observation [10022]       Medical History Past Medical History:  Diagnosis Date  . Arthritis   . Bipolar 1 disorder ([place])   . Bronchitis   . Chronic back pain   . Constipation   . COPD (chronic obstructive pulmonary disease) ([place])   . Dementia ([place])   . Diabetes mellitus   . GERD (gastroesophageal reflux disease)   . Hiatal hernia   . Hypertension   . IBS (irritable bowel syndrome)     Allergies Allergies  Allergen Reactions  . Aminoglycosides Other (See Comments)    Reaction:  Unknown   . Aspirin Other (See Comments)    Reaction:  Unknown   . Codeine Other (See Comments)    Reaction:  Unknown   . Ivp Dye [Iodinated Diagnostic Agents] Other (See Comments)    Reaction:  Unknown   . Morphine And Related Other (See Comments)    Reaction:  Unknown   . Neomycin Other (See Comments)    Reaction:  Unknown   . Penicillins Rash and Other (See Comments)    Has patient had a PCN reaction causing immediate rash, facial/tongue/throat swelling, SOB or lightheadedness with hypotension: No Has patient had a PCN reaction causing severe rash involving mucus membranes  or skin necrosis: No Has patient had a PCN reaction that required hospitalization No Has patient had a PCN reaction occurring within the last 10 years: No If all of the above answers are "NO", then may proceed with Cephalosporin use.  Marland Kitchen Phenothiazines Other (See Comments)    Reaction:  Unknown   . Promethazine Other (See Comments)    Reaction:  Unknown   . Sulfa Antibiotics Other (See Comments)    Reaction:  Unknown   . Tetracyclines & Related Other (See Comments)    Reaction: Unknown     IV Location/Drains/Wounds Patient Lines/Drains/Airways Status   Active Line/Drains/Airways    Name:   Placement date:   Placement time:   Site:    Days:   Peripheral IV 04/17/18 Left Antecubital   04/17/18    1627    Antecubital   1   Wound / Incision (Open or Dehisced) 06/11/16 Other (Comment) Leg Right;Left reddened rash   06/11/16    0130    Leg   676   Wound / Incision (Open or Dehisced) 02/26/17 Other (Comment) Leg Right;Left full thickness wound   02/26/17    1139    Leg   416          Labs/Imaging Results for orders placed or performed during the hospital encounter of 04/17/18 (from the past 48 hour(s))  Basic metabolic panel     Status: Abnormal   Collection Time: 04/17/18  5:54 PM  Result Value Ref Range   Sodium 133 (L) 135 - 145 mmol/L   Potassium 4.0 3.5 - 5.1 mmol/L   Chloride 96 (L) 98 - 111 mmol/L   CO2 26 22 - 32 mmol/L   Glucose, Bld 134 (H) 70 - 99 mg/dL   BUN 16 8 - 23 mg/dL   Creatinine, Ser 0.81 0.44 - 1.00 mg/dL   Calcium 8.7 (L) 8.9 - 10.3 mg/dL   GFR calc non Af Amer >60 >60 mL/min   GFR calc Af Amer >60 >60 mL/min    Comment: (NOTE) The eGFR has been calculated using the CKD EPI equation. This calculation has not been validated in all clinical situations. eGFR's persistently <60 mL/min signify possible Chronic Kidney Disease.    Anion gap 11 5 - 15    Comment: Performed at [facility], [place] [address], [place], [place] [address]  CBC     Status: Abnormal   Collection Time: 04/17/18  5:54 PM  Result Value Ref Range   WBC 6.3 4.0 - 10.5 K/uL   RBC 3.50 (L) 3.87 - 5.11 MIL/uL   Hemoglobin 10.3 (L) 12.0 - 15.0 g/dL   HCT 32.0 (L) 36.0 - 46.0 %   MCV 91.4 80.0 - 100.0 fL   MCH 29.4 26.0 - 34.0 pg   MCHC 32.2 30.0 - 36.0 g/dL   RDW 14.8 11.5 - 15.5 %   Platelets 197 150 - 400 K/uL   nRBC 0.0 0.0 - 0.2 %    Comment: Performed at [facility], [place] [address] [place], [place] [address]  Brain natriuretic peptide     Status: None   Collection Time: 04/17/18  5:54 PM  Result Value Ref Range   B Natriuretic Peptide 75.6 0.0 - 100.0 pg/mL    Comment: Performed  at [facility], [place] [address] [place], [place] [address]  I-stat troponin, ED     Status: None   Collection Time: 04/17/18  6:33 PM  Result Value Ref Range  Troponin i, poc 0.02 0.00 - 0.08 ng/mL   Comment 3            Comment: Due to the release kinetics of cTnI, a negative result within the first hours of the onset of symptoms does not rule out myocardial infarction with certainty. If myocardial infarction is still suspected, repeat the test at appropriate intervals.   I-stat troponin, ED     Status: None   Collection Time: 04/17/18  8:56 PM  Result Value Ref Range   Troponin i, poc 0.02 0.00 - 0.08 ng/mL   Comment 3            Comment: Due to the release kinetics of cTnI, a negative result within the first hours of the onset of symptoms does not rule out myocardial infarction with certainty. If myocardial infarction is still suspected, repeat the test at appropriate intervals.    Dg Chest 2 View  Result Date: 04/17/2018 CLINICAL DATA:  Wheezing, shortness of breath and cough for 2 days. History of diabetes and hypertension. EXAM: CHEST - 2 VIEW COMPARISON:  11/11/2017 radiographs.  CT 11/07/2017. FINDINGS: Stable mild cardiomegaly and aortic atherosclerosis. There are low lung volumes with vascular congestion, but no overt pulmonary edema or confluent airspace opacity. Nerve stimulator generator projects over the left anterior chest with wires leading into the left base of neck. No acute osseous findings are seen. IMPRESSION: No acute cardiopulmonary process. Aortic atherosclerosis and vascular congestion. Electronically Signed   By: Richardean Sale M.D.   On: 04/17/2018 19:38    Pending Labs Unresulted Labs (From admission, onward)    Start     Ordered   04/24/18 0500  Creatinine, serum  (enoxaparin (LOVENOX)    CrCl >/= 30 ml/min)  Weekly,   R    Comments:  while on enoxaparin therapy    04/17/18 2220   04/18/18 3220  Basic metabolic panel  Tomorrow  morning,   R     04/17/18 2220   04/17/18 2220  Culture, sputum-assessment  Once,   R     04/17/18 2220          Vitals/Pain Today's Vitals   04/17/18 1831 04/17/18 2002 04/17/18 2200 04/17/18 2300  BP: (!) 176/76 (!) 142/88 (!) 153/61 (!) 173/69  Pulse: 95 93 92 (!) 101  Resp: (!) _0 Temp:      TempSrc:      SpO2: 94% 94% 95% 93%  Weight:      Height:      PainSc:        Isolation Precautions No active isolations  Medications Medications  amLODipine (NORVASC) tablet 5 mg (has no administration in time range)  atorvastatin (LIPITOR) tablet 40 mg (has no administration in time range)  furosemide (LASIX) tablet 40 mg (has no administration in time range)  irbesartan (AVAPRO) tablet 300 mg (has no administration in time range)  donepezil (ARICEPT) tablet 10 mg (has no administration in time range)  LORazepam (ATIVAN) tablet 1 mg (has no administration in time range)  QUEtiapine (SEROQUEL) tablet 150 mg (has no administration in time range)  QUEtiapine (SEROQUEL) tablet 125 mg (has no administration in time range)  alum & mag hydroxide-simeth (MAALOX/MYLANTA) 200-200-20 MG/5ML suspension 30 mL (has no administration in time range)  dicyclomine (BENTYL) capsule 10 mg (has no administration in time range)  pantoprazole (PROTONIX) EC tablet 40 mg (has no administration in time range)  famotidine (PEPCID) tablet 20 mg (has no administration in time  range)  senna-docusate (Senokot-S) tablet 1 tablet (has no administration in time range)  clopidogrel (PLAVIX) tablet 75 mg (has no administration in time range)  lamoTRIgine (LAMICTAL) tablet 200 mg (has no administration in time range)  mometasone-formoterol (DULERA) 200-5 MCG/ACT inhaler 2 puff (2 puffs Inhalation Not Given 04/17/18 2250)  guaiFENesin-dextromethorphan (ROBITUSSIN DM) 100-10 MG/5ML syrup 5 mL (has no administration in time range)  ipratropium-albuterol (DUONEB) 0.5-2.5 (3) MG/3ML nebulizer solution 3 mL (3  mLs Nebulization Given 04/17/18 2240)  montelukast (SINGULAIR) tablet 10 mg (has no administration in time range)  enoxaparin (LOVENOX) injection 40 mg (has no administration in time range)  sodium chloride flush (NS) 0.9 % injection 3 mL (has no administration in time range)  sodium chloride flush (NS) 0.9 % injection 3 mL (has no administration in time range)  0.9 %  sodium chloride infusion (has no administration in time range)  acetaminophen (TYLENOL) tablet 650 mg (has no administration in time range)    Or  acetaminophen (TYLENOL) suppository 650 mg (has no administration in time range)  magnesium hydroxide (MILK OF MAGNESIA) suspension 30 mL (has no administration in time range)  ondansetron (ZOFRAN) tablet 4 mg (has no administration in time range)    Or  ondansetron (ZOFRAN) injection 4 mg (has no administration in time range)  azithromycin (ZITHROMAX) 500 mg in sodium chloride 0.9 % 250 mL IVPB (has no administration in time range)  albuterol (PROVENTIL) (2.5 MG/3ML) 0.083% nebulizer solution 2.5 mg (has no administration in time range)  methylPREDNISolone sodium succinate (SOLU-MEDROL) 125 mg/2 mL injection 60 mg (has no administration in time range)  albuterol (PROVENTIL) (2.5 MG/3ML) 0.083% nebulizer solution 5 mg (5 mg Nebulization Given 04/17/18 1756)  ipratropium-albuterol (DUONEB) 0.5-2.5 (3) MG/3ML nebulizer solution 3 mL (3 mLs Nebulization Given 04/17/18 2004)  magnesium sulfate IVPB 2 g 50 mL (0 g Intravenous Stopped 04/17/18 2003)  LORazepam (ATIVAN) tablet 1 mg (1 mg Oral Given 04/17/18 2204)    Mobility walks with person assist

## 2018-04-19 LAB — GLUCOSE, CAPILLARY: Glucose-Capillary: 142 mg/dL — ABNORMAL HIGH (ref 70–99)

## 2018-04-19 MED ORDER — IPRATROPIUM-ALBUTEROL 0.5-2.5 (3) MG/3ML IN SOLN
3.0000 mL | Freq: Three times a day (TID) | RESPIRATORY_TRACT | Status: DC
  Administered 2018-04-19 – 2018-04-21 (×7): 3 mL via RESPIRATORY_TRACT
  Filled 2018-04-19 (×8): qty 3

## 2018-04-19 MED ORDER — METHYLPREDNISOLONE SODIUM SUCC 40 MG IJ SOLR
40.0000 mg | Freq: Three times a day (TID) | INTRAMUSCULAR | Status: DC
  Administered 2018-04-19 – 2018-04-21 (×6): 40 mg via INTRAVENOUS
  Filled 2018-04-19 (×6): qty 1

## 2018-04-19 NOTE — Progress Notes (Signed)
PROGRESS NOTE    Cindy DawnChristine B Duncombe  UJW:[id number] DOB: 07-01-1940 DOA: 04/17/2018 PCP: Ron ParkerBowen, Samuel, MD  Outpatient Specialists:    Brief Narrative:  Cindy Padilla is a 77 y.o. female with medical history significant for bipolar disorder, COPD, mild dementia, hypertension, and chronic venous stasis, now presenting to the emergency department for evaluation of increased cough and wheezing.  Patient is a poor historian.  Chest x-ray is said to reveal vascular congestion.  Cardiac BNP is within normal range.  Last echo was in September 2014, and revealed grade 1 diastolic dysfunction.  No recent echocardiogram visualized.  04/19/2018: Respiratory symptoms seem to be improving.  However, patient is over talkative.  Patient has history of bipolar disorder.  Will decrease the dose of steroids.  We will continue to monitor closely for overt hypomania.  Patient is already on quetiapine.  Assessment & Plan:   Principal Problem:   COPD with acute exacerbation (HCC) Active Problems:   Hyponatremia   HTN (hypertension)   Chronic diastolic CHF (congestive heart failure) (HCC)   Dementia with behavioral disturbance (HCC)   Bipolar 1 disorder (HCC)   Normocytic anemia  1. COPD with acute exacerbation  - Presents with SOB, cough, and wheezing  - CXR is clear, afebrile, no leukocytosis  - Treated with 125 mg IV Solu-Medrol and nebs by EMS, then continuous albuterol tx and DuoNeb in ED but remains dyspneic at rest  - Check sputum culture, continue systemic steroid, start azithromycin, continue ICS/LABA, DuoNebs, and as-needed albuterol nebs   04/18/2018: Continue current management. 04/19/2018: Decrease the dose of IV Solu-Medrol to 40 mg every 8 hours.  Likely DC in the next 1 to 2 days.  2. Chronic diastolic CHF  - Appears compensated  - Continue Lasix and ARB   04/18/2018: Continue current medication.  Monitor cardiac status closely.  3. Bipolar disorder  - Continue Lamictal,  Seroquel, as-needed Ativan   -Monitor closely.  4. Dementia  - Continue Aricept    5. Hypertension  - BP at goal  - Continue ARB and Norvasc  6. Chronic venous stasis  - Continue compression, elevation     DVT prophylaxis: Lovenox  Code Status: DNR  Family Communication: Discussed with patient  Consults called: None  Procedures:   None  Antimicrobials:   Azithromycin   Subjective: Shortness of breath and wheezing are improving. Patient is over talkative.    Objective: Vitals:   04/19/18 0952 04/19/18 0954 04/19/18 1315 04/19/18 1404  BP: (!) 153/80 (!) 153/80 (!) 173/73   Pulse: 85  85   Resp:      Temp:      TempSrc:      SpO2: 93%  96% 94%  Weight:      Height:        Intake/Output Summary (Last 24 hours) at 04/19/2018 1742 Last data filed at 04/19/2018 1619 Gross per 24 hour  Intake 3005.13 ml  Output 1450 ml  Net 1555.13 ml   Filed Weights   04/17/18 1718 04/18/18 0300 04/19/18 0453  Weight: 81.6 kg 86.3 kg 87.2 kg    Examination:  General exam: Appears calm and comfortable.  Obese. Respiratory system: Decreased air entry.  Expiratory wheeze.   Cardiovascular system: S1 & S2 heard.  Gastrointestinal system: Abdomen is obese, soft and nontender. No organomegaly or masses felt. Normal bowel sounds heard. Central nervous system: Alert and oriented. No focal neurological deficits.  Over talkative. Extremities: Unna boots to the lower legs.  Data Reviewed: I  have personally reviewed following labs and imaging studies  CBC: Recent Labs  Lab 04/17/18 1754  WBC 6.3  HGB 10.3*  HCT 32.0*  MCV 91.4  PLT 197   Basic Metabolic Panel: Recent Labs  Lab 04/17/18 1754 04/18/18 0504  NA 133* 133*  K 4.0 4.2  CL 96* 97*  CO2 26 24  GLUCOSE 134* 218*  BUN 16 13  CREATININE 0.81 0.77  CALCIUM 8.7* 8.9   GFR: Estimated Creatinine Clearance: 56.9 mL/min (by C-G formula based on SCr of 0.77 mg/dL). Liver Function Tests: No results for  input(s): AST, ALT, ALKPHOS, BILITOT, PROT, ALBUMIN in the last 168 hours. No results for input(s): LIPASE, AMYLASE in the last 168 hours. No results for input(s): AMMONIA in the last 168 hours. Coagulation Profile: No results for input(s): INR, PROTIME in the last 168 hours. Cardiac Enzymes: No results for input(s): CKTOTAL, CKMB, CKMBINDEX, TROPONINI in the last 168 hours. BNP (last 3 results) No results for input(s): PROBNP in the last 8760 hours. HbA1C: No results for input(s): HGBA1C in the last 72 hours. CBG: Recent Labs  Lab 04/18/18 0748 04/19/18 0737  GLUCAP 138* 142*   Lipid Profile: No results for input(s): CHOL, HDL, LDLCALC, TRIG, CHOLHDL, LDLDIRECT in the last 72 hours. Thyroid Function Tests: No results for input(s): TSH, T4TOTAL, FREET4, T3FREE, THYROIDAB in the last 72 hours. Anemia Panel: No results for input(s): VITAMINB12, FOLATE, FERRITIN, TIBC, IRON, RETICCTPCT in the last 72 hours. Urine analysis:    Component Value Date/Time   COLORURINE COLORLESS (A) 03/30/2017 1813   APPEARANCEUR CLEAR 03/30/2017 1813   LABSPEC 1.003 (L) 03/30/2017 1813   PHURINE 7.0 03/30/2017 1813   GLUCOSEU NEGATIVE 03/30/2017 1813   HGBUR NEGATIVE 03/30/2017 1813   BILIRUBINUR NEGATIVE 03/30/2017 1813   KETONESUR NEGATIVE 03/30/2017 1813   PROTEINUR NEGATIVE 03/30/2017 1813   UROBILINOGEN 0.2 09/19/2014 1633   NITRITE NEGATIVE 03/30/2017 1813   LEUKOCYTESUR NEGATIVE 03/30/2017 1813   Sepsis Labs: @LABRCNTIP (procalcitonin:4,lacticidven:4)  )No results found for this or any previous visit (from the past 240 hour(s)).       Radiology Studies: Dg Chest 2 View  Result Date: 04/17/2018 CLINICAL DATA:  Wheezing, shortness of breath and cough for 2 days. History of diabetes and hypertension. EXAM: CHEST - 2 VIEW COMPARISON:  11/11/2017 radiographs.  CT 11/07/2017. FINDINGS: Stable mild cardiomegaly and aortic atherosclerosis. There are low lung volumes with vascular  congestion, but no overt pulmonary edema or confluent airspace opacity. Nerve stimulator generator projects over the left anterior chest with wires leading into the left base of neck. No acute osseous findings are seen. IMPRESSION: No acute cardiopulmonary process. Aortic atherosclerosis and vascular congestion. Electronically Signed   By: Carey Bullocks M.D.   On: 04/17/2018 19:38        Scheduled Meds: . amLODipine  5 mg Oral BID  . atorvastatin  40 mg Oral q1800  . clopidogrel  75 mg Oral Daily  . dicyclomine  10 mg Oral QID  . donepezil  10 mg Oral QHS  . enoxaparin (LOVENOX) injection  40 mg Subcutaneous Q24H  . famotidine  20 mg Oral QHS  . furosemide  40 mg Oral Daily  . ipratropium-albuterol  3 mL Nebulization TID  . irbesartan  300 mg Oral Daily  . lamoTRIgine  200 mg Oral BID  . mouth rinse  15 mL Mouth Rinse BID  . methylPREDNISolone (SOLU-MEDROL) injection  40 mg Intravenous Q8H  . montelukast  10 mg Oral QHS  .  pantoprazole  40 mg Oral Daily  . QUEtiapine  125 mg Oral Daily  . QUEtiapine  150 mg Oral QHS  . senna-docusate  1 tablet Oral BID  . sodium chloride flush  3 mL Intravenous Q12H   Continuous Infusions: . sodium chloride 250 mL (04/19/18 0131)  . azithromycin 500 mg (04/18/18 2133)     LOS: 1 day    Time spent: 25 minutes.    Berton Mount, MD  [facility] Pager #: [phone] 7PM-7AM contact night coverage as above

## 2018-04-19 NOTE — Progress Notes (Signed)
Patient oxygen level 94-96% on room air

## 2018-04-19 NOTE — Care Management Note (Signed)
Case Management Note  Patient Details  Name: Cindy Padilla MRN: [id number] Date of Birth: 11/11/1940  Subjective/Objective:   COPD exec, CHF,  Bipolar Disorder, Dementia, HTN               Action/Plan: NCM spoke to pt and states [facility]. She has a Charity fundraiserN that comes twice per week to do wrap her legs. [facility] # [phone] to get information [facility], no answer. She has neb machine and RW. She is able to walk to bathroom. Pt may need resumption of care orders [facility] and oxygen. Will continue to follow for dc needs.    Expected Discharge Date:                  Expected Discharge Plan:  Assisted Living / Rest Home  In-House Referral:  Clinical Social Work  Discharge planning Services  CM Consult  Post Acute Care Choice:  Home Health, Resumption of Svcs/PTA Provider Choice offered to:  Patient  DME Arranged:    DME Agency:     HH Arranged:    HH Agency:     Status of Service:  In process, will continue to follow  If discussed at Long Length of Stay Meetings, dates discussed:    Additional Comments:  Elliot CousinShavis, Charla Criscione Ellen, RN 04/19/2018, 11:22 AM

## 2018-04-20 LAB — GLUCOSE, CAPILLARY: Glucose-Capillary: 197 mg/dL — ABNORMAL HIGH (ref 70–99)

## 2018-04-20 MED ORDER — SODIUM CHLORIDE 3 % IN NEBU
4.0000 mL | INHALATION_SOLUTION | Freq: Every day | RESPIRATORY_TRACT | Status: DC
  Administered 2018-04-21: 4 mL via RESPIRATORY_TRACT
  Filled 2018-04-20 (×2): qty 4

## 2018-04-20 MED ORDER — AZITHROMYCIN 250 MG PO TABS
500.0000 mg | ORAL_TABLET | Freq: Every day | ORAL | Status: DC
  Administered 2018-04-21: 500 mg via ORAL
  Filled 2018-04-20: qty 2

## 2018-04-20 NOTE — Progress Notes (Signed)
[facility] Tamy Tate-[facility]rep Mike aware.Patient has neb machine, rw @ ALF. Noted on 02-will monitor if needed. PT-cons await recc.CSW following for return back.

## 2018-04-20 NOTE — Evaluation (Signed)
Physical Therapy Evaluation Patient Details Name: Cindy Padilla MRN: [id number] DOB: [birth year] Today's Date: 04/20/2018   History of Present Illness  77 yo female admitted with COPD exac. Hx of dementia, COPD, biplar d/o, CHF, chronic venous stasis  Clinical Impression  On eval, pt was Min guard assist for mobility. She walked ~100 feet with a RW. O2 sat dropped to 88% on RA during ambulation. Noted wheezing and dyspnea with ambulation. Will continue to follow during hospital stay. Pt should be able to return to ALF at discharge.    Follow Up Recommendations (return to ALF)    Equipment Recommendations  None recommended by PT    Recommendations for Other Services       Precautions / Restrictions Precautions Precautions: Fall Restrictions Weight Bearing Restrictions: No      Mobility  Bed Mobility Overal bed mobility: Needs Assistance Bed Mobility: Supine to Sit;Sit to Supine     Supine to sit: Supervision;HOB elevated Sit to supine: Supervision;HOB elevated   General bed mobility comments: for safety  Transfers Overall transfer level: Needs assistance Equipment used: Rolling walker (2 wheeled);None Transfers: Sit to/from [employer]GI CorporationStand;Stand Pivot Transfers Sit to Stand: Supervision Stand pivot transfers: Supervision       General transfer comment: for safety.  Ambulation/Gait Ambulation/Gait assistance: Min guard Gait Distance (Feet): 100 Feet Assistive device: Rolling walker (2 wheeled) Gait Pattern/deviations: Step-through pattern;Decreased stride length     General Gait Details: close guard for safety. wheezing noted. O2 sat 88% on RA, dyspnea 2/4.  Stairs            Wheelchair Mobility    Modified Rankin (Stroke Patients Only)       Balance Overall balance assessment: Needs assistance         Standing balance support: Bilateral upper extremity supported Standing balance-Leahy Scale: Poor                                Pertinent Vitals/Pain Pain Assessment: No/denies pain    Home Living Family/patient expects to be discharged to:: Assisted living               Home Equipment: Walker - 2 wheels      Prior Function Level of Independence: Needs assistance   Gait / Transfers Assistance Needed: uses walker outside of room; no device inside room  ADL's / Homemaking Assistance Needed: assist with bathing. RN applies Northwest AirlinesUnna boots PRN.        Hand Dominance        Extremity/Trunk Assessment   Upper Extremity Assessment Upper Extremity Assessment: Overall WFL for tasks assessed    Lower Extremity Assessment Lower Extremity Assessment: Generalized weakness    Cervical / Trunk Assessment Cervical / Trunk Assessment: Kyphotic  Communication   Communication: No difficulties  Cognition Arousal/Alertness: Awake/alert Behavior During Therapy: WFL for tasks assessed/performed Overall Cognitive Status: History of cognitive impairments - at baseline                                        General Comments      Exercises     Assessment/Plan    PT Assessment Patient needs continued PT services  PT Problem List Decreased mobility;Decreased activity tolerance;Decreased balance       PT Treatment Interventions Gait training;DME instruction;Functional mobility training;Therapeutic activities;Balance training;Patient/family education;Therapeutic exercise    PT  Goals (Current goals can be found in the Care Plan section)  Acute Rehab PT Goals Patient Stated Goal: none stated PT Goal Formulation: With patient Time For Goal Achievement: 05/04/18 Potential to Achieve Goals: Good    Frequency Min 3X/week   Barriers to discharge        Co-evaluation               AM-PAC PT "6 Clicks" Mobility  Outcome Measure Help needed turning from your back to your side while in a flat bed without using bedrails?: None Help needed moving from lying on your back to sitting  on the side of a flat bed without using bedrails?: None Help needed moving to and from a bed to a chair (including a wheelchair)?: None Help needed standing up from a chair using your arms (e.g., wheelchair or bedside chair)?: None Help needed to walk in hospital room?: A Little Help needed climbing 3-5 steps with a railing? : A Little 6 Click Score: 22    End of Session Equipment Utilized During Treatment: Gait belt Activity Tolerance: Patient tolerated treatment well Patient left: in bed;with call bell/phone within reach;with bed alarm set   PT Visit Diagnosis: Unsteadiness on feet (R26.81)    Time: 1914-7829 PT Time Calculation (min) (ACUTE ONLY): 24 min   Charges:   PT Evaluation $PT Eval Moderate Complexity: 1 Mod PT Treatments $Gait Training: 8-22 mins          Rebeca Alert, PT Acute Rehabilitation Services Pager: [phone] Office: [phone]

## 2018-04-20 NOTE — Progress Notes (Signed)
PROGRESS NOTE    Cindy Padilla  NUU:[id number] DOB: [birth year] DOA: 04/17/2018 PCP: Ron Parker, MD  Outpatient Specialists:    Brief Narrative:  Cindy Padilla is a 77 y.o. female with medical history significant for bipolar disorder, COPD, mild dementia, hypertension, and chronic venous stasis, now presenting to the emergency department for evaluation of increased cough and wheezing.  Patient is a poor historian.  Chest x-ray is said to reveal vascular congestion.  Cardiac BNP is within normal range.  Last echo was in September 2014, and revealed grade 1 diastolic dysfunction.  No recent echocardiogram visualized.  04/19/2018: Respiratory symptoms seem to be improving.  However, patient is over talkative.  Patient has history of bipolar disorder.  Will decrease the dose of steroids.  We will continue to monitor closely for overt hypomania.  Patient is already on quetiapine.  04/20/2018: She continues to improve, but still has significant wheezing.  Patient complains of difficulty bringing up the phlegm.  Will start flutter valve, incentive spirometry as well as nebs hypertonic saline.  Hopefully, patient will be discharged back in the next 1 to 2 days.  Assessment & Plan:   Principal Problem:   COPD with acute exacerbation (HCC) Active Problems:   Hyponatremia   HTN (hypertension)   Chronic diastolic CHF (congestive heart failure) (HCC)   Dementia with behavioral disturbance (HCC)   Bipolar 1 disorder (HCC)   Normocytic anemia  1. COPD with acute exacerbation  - Presents with SOB, cough, and wheezing  - CXR is clear, afebrile, no leukocytosis  - Treated with 125 mg IV Solu-Medrol and nebs by EMS, then continuous albuterol tx and DuoNeb in ED but remains dyspneic at rest  - Check sputum culture, continue systemic steroid, start azithromycin, continue ICS/LABA, DuoNebs, and as-needed albuterol nebs   04/18/2018: Continue current management. 04/19/2018: Decrease the  dose of IV Solu-Medrol to 40 mg every 8 hours.  Likely DC in the next 1 to 2 days. 04/20/2018: Start flutter valve, incentive spirometry and nebs and hypertonic saline.  2. Chronic diastolic CHF  - Appears compensated  - Continue Lasix and ARB   04/18/2018: Continue current medication.  Monitor cardiac status closely.  3. Bipolar disorder  - Continue Lamictal, Seroquel, as-needed Ativan   -Monitor closely.  4. Dementia  - Continue Aricept    5. Hypertension  - BP at goal  - Continue ARB and Norvasc  6. Chronic venous stasis  - Continue compression, elevation     DVT prophylaxis: Lovenox  Code Status: DNR  Family Communication: Discussed with patient  Consults called: None  Procedures:   None  Antimicrobials:   Azithromycin   Subjective: Shortness of breath and wheezing are improving. Patient is finding it difficult to bring up the phlegms. Patient is over talkative.    Objective: Vitals:   04/19/18 2018 04/20/18 0603 04/20/18 0606 04/20/18 0616  BP: (!) 160/70 (!) 154/50    Pulse: 97 83    Resp: 20 20    Temp: 98.2 F (36.8 C) 98.8 F (37.1 C)    TempSrc: Oral Oral    SpO2: 93% 90%  93%  Weight:   85.6 kg   Height:        Intake/Output Summary (Last 24 hours) at 04/20/2018 1222 Last data filed at 04/20/2018 0900 Gross per 24 hour  Intake 378.54 ml  Output 2100 ml  Net -1721.46 ml   Filed Weights   04/18/18 0300 04/19/18 0453 04/20/18 0606  Weight: 86.3 kg 87.2  kg 85.6 kg    Examination:  General exam: Appears calm and comfortable.  Obese. Respiratory system: Decreased air entry.  Expiratory wheeze.   Cardiovascular system: S1 & S2 heard.  Gastrointestinal system: Abdomen is obese, soft and nontender. No organomegaly or masses felt. Normal bowel sounds heard. Central nervous system: Alert and oriented. No focal neurological deficits.  Over talkative. Extremities: Unna boots to the lower legs.  Data Reviewed: I have personally  reviewed following labs and imaging studies  CBC: Recent Labs  Lab 04/17/18 1754  WBC 6.3  HGB 10.3*  HCT 32.0*  MCV 91.4  PLT 197   Basic Metabolic Panel: Recent Labs  Lab 04/17/18 1754 04/18/18 0504  NA 133* 133*  K 4.0 4.2  CL 96* 97*  CO2 26 24  GLUCOSE 134* 218*  BUN 16 13  CREATININE 0.81 0.77  CALCIUM 8.7* 8.9   GFR: Estimated Creatinine Clearance: 56.3 mL/min (by C-G formula based on SCr of 0.77 mg/dL). Liver Function Tests: No results for input(s): AST, ALT, ALKPHOS, BILITOT, PROT, ALBUMIN in the last 168 hours. No results for input(s): LIPASE, AMYLASE in the last 168 hours. No results for input(s): AMMONIA in the last 168 hours. Coagulation Profile: No results for input(s): INR, PROTIME in the last 168 hours. Cardiac Enzymes: No results for input(s): CKTOTAL, CKMB, CKMBINDEX, TROPONINI in the last 168 hours. BNP (last 3 results) No results for input(s): PROBNP in the last 8760 hours. HbA1C: No results for input(s): HGBA1C in the last 72 hours. CBG: Recent Labs  Lab 04/18/18 0748 04/19/18 0737 04/20/18 1111  GLUCAP 138* 142* 197*   Lipid Profile: No results for input(s): CHOL, HDL, LDLCALC, TRIG, CHOLHDL, LDLDIRECT in the last 72 hours. Thyroid Function Tests: No results for input(s): TSH, T4TOTAL, FREET4, T3FREE, THYROIDAB in the last 72 hours. Anemia Panel: No results for input(s): VITAMINB12, FOLATE, FERRITIN, TIBC, IRON, RETICCTPCT in the last 72 hours. Urine analysis:    Component Value Date/Time   COLORURINE COLORLESS (A) 03/30/2017 1813   APPEARANCEUR CLEAR 03/30/2017 1813   LABSPEC 1.003 (L) 03/30/2017 1813   PHURINE 7.0 03/30/2017 1813   GLUCOSEU NEGATIVE 03/30/2017 1813   HGBUR NEGATIVE 03/30/2017 1813   BILIRUBINUR NEGATIVE 03/30/2017 1813   KETONESUR NEGATIVE 03/30/2017 1813   PROTEINUR NEGATIVE 03/30/2017 1813   UROBILINOGEN 0.2 09/19/2014 1633   NITRITE NEGATIVE 03/30/2017 1813   LEUKOCYTESUR NEGATIVE 03/30/2017 1813    Sepsis Labs: @LABRCNTIP (procalcitonin:4,lacticidven:4)  )No results found for this or any previous visit (from the past 240 hour(s)).       Radiology Studies: No results found.      Scheduled Meds: . amLODipine  5 mg Oral BID  . atorvastatin  40 mg Oral q1800  . clopidogrel  75 mg Oral Daily  . dicyclomine  10 mg Oral QID  . donepezil  10 mg Oral QHS  . enoxaparin (LOVENOX) injection  40 mg Subcutaneous Q24H  . famotidine  20 mg Oral QHS  . furosemide  40 mg Oral Daily  . ipratropium-albuterol  3 mL Nebulization TID  . irbesartan  300 mg Oral Daily  . lamoTRIgine  200 mg Oral BID  . mouth rinse  15 mL Mouth Rinse BID  . methylPREDNISolone (SOLU-MEDROL) injection  40 mg Intravenous Q8H  . montelukast  10 mg Oral QHS  . pantoprazole  40 mg Oral Daily  . QUEtiapine  125 mg Oral Daily  . QUEtiapine  150 mg Oral QHS  . senna-docusate  1 tablet Oral BID  .  sodium chloride flush  3 mL Intravenous Q12H  . sodium chloride HYPERTONIC  4 mL Nebulization Daily   Continuous Infusions: . sodium chloride Stopped (04/19/18 1100)  . azithromycin 500 mg (04/20/18 0014)     LOS: 2 days    Time spent: 25 minutes.    Berton Mount, MD  [facility] Pager #: [phone] 7PM-7AM contact night coverage as above

## 2018-04-20 NOTE — Progress Notes (Signed)
PHARMACIST - PHYSICIAN COMMUNICATION DR:   Dartha Lodgegbata CONCERNING: Antibiotic IV to Oral Route Change Policy  RECOMMENDATION: This patient is receiving azithromycin by the intravenous route.  Based on criteria approved by the Pharmacy and Therapeutics Committee, the antibiotic(s) is/are being converted to the equivalent oral dose form(s).   DESCRIPTION: These criteria include:  Patient being treated for a respiratory tract infection, urinary tract infection, cellulitis or clostridium difficile associated diarrhea if on metronidazole  The patient is not neutropenic and does not exhibit a GI malabsorption state  The patient is eating (either orally or via tube) and/or has been taking other orally administered medications for a least 24 hours  The patient is improving clinically and has a Tmax < 100.5  If you have questions about this conversion, please contact the Pharmacy Department  []   [phone]( [phone] )  [facility] []   [phone]( [phone] )  [facility] []   [phone]( [phone] )  [facility] []   [phone]( [phone]  [facility] [x]   [phone]( [phone] )  [facility]  Luisa HartScott Niguel Moure, PharmD Clinical Pharmacist Pager # [phone]

## 2018-04-20 NOTE — Care Management Note (Signed)
Case Management Note  Patient Details  Name: Cindy Padilla MRN: [id number] Date of Birth: 1940-06-16  Subjective/Objective: COPD. Hx: DNR,Bipolar,CHF,leg cellulitis. TC [place]-ALF-await call back from Nomeammy Tate-to confirm services @ ALF. Spoke to dtr-Susan Maines [phone]-d/c plan return back to ALF-she is unsure of HHC agency/& DME company for HHRN-Wrap legs BIW,& 02,neb machine,rw. PT cons-await recc.                  Action/Plan:d/c back to ALF w/HHC.   Expected Discharge Date:                  Expected Discharge Plan:  Assisted Living / Rest Home  In-House Referral:  Clinical Social Work  Discharge planning Services  CM Consult  Post Acute Care Choice:  Home Health, Resumption of Svcs/PTA Provider, Durable Medical Equipment(rw,neb machine,02) Choice offered to:  Patient  DME Arranged:    DME Agency:     HH Arranged:    HH Agency:     Status of Service:  In process, will continue to follow  If discussed at Long Length of Stay Meetings, dates discussed:    Additional Comments:  Lanier ClamMahabir, Marquetta Weiskopf, RN 04/20/2018, 11:37 AM

## 2018-04-21 LAB — GLUCOSE, CAPILLARY: Glucose-Capillary: 143 mg/dL — ABNORMAL HIGH (ref 70–99)

## 2018-04-21 MED ORDER — PREDNISONE 10 MG PO TABS: ORAL_TABLET | ORAL | 0 refills | Status: DC

## 2018-04-21 MED ORDER — IPRATROPIUM-ALBUTEROL 0.5-2.5 (3) MG/3ML IN SOLN: 3.0000 mL | Freq: Four times a day (QID) | RESPIRATORY_TRACT | 1 refills | Status: DC | PRN

## 2018-04-21 MED ORDER — HYDRALAZINE HCL 20 MG/ML IJ SOLN
5.0000 mg | Freq: Once | INTRAMUSCULAR | Status: AC
  Administered 2018-04-21: 5 mg via INTRAVENOUS
  Filled 2018-04-21: qty 1

## 2018-04-21 MED ORDER — TIOTROPIUM BROMIDE MONOHYDRATE 18 MCG IN CAPS: 18.0000 ug | ORAL_CAPSULE | Freq: Every day | RESPIRATORY_TRACT | 2 refills | Status: DC

## 2018-04-21 NOTE — Clinical Social Work Note (Signed)
Clinical Social Work Assessment  Patient Details  Name: Cindy Padilla MRN: [id number] Date of Birth: [birth year]  Date of referral:  04/21/18               Reason for consult:  Discharge Planning                Permission sought to share information with:  Family Supports Permission granted to share information::  Yes, Verbal Permission Granted  Name::     Lowell Guitar  Agency::  snf  Relationship::  daughter  Contact Information:  [phone]  Housing/Transportation Living arrangements for the past 2 months:  [place] of Information:  Patient Patient Interpreter Needed:  None Criminal Activity/Legal Involvement Pertinent to Current Situation/Hospitalization:  No - Comment as needed Significant Relationships:  Adult Children, Community Support Lives with:  Facility Resident Do you feel safe going back to the place where you live?  Yes Need for family participation in patient care:  Yes (Comment)  Care giving concerns:  No family at bedside. Patient stated she has support from her daughter but stated her daughter works a lot. Patient verified she is from [place]   Social Worker assessment / plan:  CSW met patient at bedside to go over discharge plans. Patient stated she is agreeable to go back home and would like to go by PTAR since patients daughter will be unable to take her  Employment status:  Retired Forensic scientist:  Medicare PT Recommendations:  Not assessed at this time Information / Referral to community resources:  Other (Comment Required)(back to facility)  Patient/Family's Response to care:  Patient very pleasant during assessment   Patient/Family's Understanding of and Emotional Response to Diagnosis, Current Treatment, and Prognosis:  Patient to go back home to ALF. Facility is aware of patients discharge and is willing to go back  Emotional Assessment Appearance:  Appears younger than stated  age Attitude/Demeanor/Rapport:  Engaged Affect (typically observed):  Accepting Orientation:  Oriented to Self, Oriented to Place, Oriented to  Time, Oriented to Situation Alcohol / Substance use:  Not Applicable Psych involvement (Current and /or in the community):  No (Comment)  Discharge Needs  Concerns to be addressed:  No discharge needs identified Readmission within the last 30 days:  No Current discharge risk:  None Barriers to Discharge:  No Barriers Identified   Wende Neighbors, LCSW 04/21/2018, 12:12 PM

## 2018-04-21 NOTE — Care Management Important Message (Signed)
Important Message  Patient Details  Name: Cindy Padilla MRN: [id number] Date of Birth: [birth year]   Medicare Important Message Given:  Yes    Cindy Padilla 04/21/2018, 10:31 AM

## 2018-04-21 NOTE — Discharge Summary (Signed)
Physician Discharge Summary  Patient ID: Cindy Padilla MRN: [id number] DOB/AGE: 06/14/1940 77 y.o.  Admit date: 04/17/2018 Discharge date: 04/21/2018  Admission Diagnoses:  Discharge Diagnoses:  Principal Problem:   COPD with acute exacerbation (HCC) Active Problems:   Hyponatremia   HTN (hypertension)   Chronic diastolic CHF (congestive heart failure) (HCC)   Dementia with behavioral disturbance (HCC)   Bipolar 1 disorder (HCC)   Normocytic anemia   Discharged Condition: stable  Hospital Course: Cindy Padilla a 77 year old female, with past medical history significant for bipolar disorder, COPD, mild dementia, hypertension, and chronic venous stasis.  Patient was admitted with worsening shortness of breath, wheezing and predominantly dry cough.  Patient was admitted and managed for COPD with exacerbation.  Patient was managed with IV Solu-Medrol and neb treatments.  Patient has improved significantly.  Patient is almost back to her baseline.  Patient will be discharged back to assisted living facility.  Patient will be discharged on tapering dose of prednisone.  Patient will need to follow with the primary care provider within 1 week of discharge.  COPD with acute exacerbation Patient presented with SOB, cough, and wheezing Chest x-ray was none revealing.  No fever.  No leukocytosis. Patient was managed with IV steroids and nebulizer treatment. Patient has improved significantly, and almost back to her baseline.  2.Chronic diastolic CHF -Appears compensated -Continue Lasix and ARB  3.Bipolar disorder -Continue Lamictal, Seroquel, as-needed Ativan -Monitor closely as patient was over talkative while on IV Solu-Medrol. -Patient will need to follow-up with her psychiatrist on discharge.  4.Dementia -Continue Aricept  5.Hypertension -Continue to optimize.  6. Chronic venous stasis  - Continue compression, elevation  Consults:  None  Discharge Exam: Blood pressure (!) 173/69, pulse 72, temperature 97.9 F (36.6 C), temperature source Oral, resp. rate 18, height 5' (1.524 m), weight 84 kg, SpO2 94 %.   Disposition: Discharge disposition: 01-Home or Self Care   Discharge Instructions    Diet - low sodium heart healthy   Complete by:  As directed    Increase activity slowly   Complete by:  As directed      Allergies as of 04/21/2018      Reactions   Aminoglycosides Other (See Comments)   Reaction:  Unknown    Aspirin Other (See Comments)   Reaction:  Unknown    Codeine Other (See Comments)   Reaction:  Unknown    Ivp Dye [iodinated Diagnostic Agents] Other (See Comments)   Reaction:  Unknown    Morphine And Related Other (See Comments)   Reaction:  Unknown    Neomycin Other (See Comments)   Reaction:  Unknown    Penicillins Rash, Other (See Comments)   Has patient had a PCN reaction causing immediate rash, facial/tongue/throat swelling, SOB or lightheadedness with hypotension: No Has patient had a PCN reaction causing severe rash involving mucus membranes or skin necrosis: No Has patient had a PCN reaction that required hospitalization No Has patient had a PCN reaction occurring within the last 10 years: No If all of the above answers are "NO", then may proceed with Cephalosporin use.   Phenothiazines Other (See Comments)   Reaction:  Unknown    Promethazine Other (See Comments)   Reaction:  Unknown    Sulfa Antibiotics Other (See Comments)   Reaction:  Unknown    Tetracyclines & Related Other (See Comments)   Reaction: Unknown       Medication List    STOP taking these medications  BAZA ANTIFUNGAL 2 % cream Generic drug:  miconazole   loperamide 2 MG capsule Commonly known as:  IMODIUM   mupirocin cream 2 % Commonly known as:  BACTROBAN   phenylephrine-shark liver oil-mineral oil-petrolatum 0.25-3-14-71.9 % rectal ointment Commonly known as:  PREPARATION H   triamcinolone cream  0.1 % Commonly known as:  KENALOG   vitamin A & D ointment     TAKE these medications   acetaminophen 325 MG tablet Commonly known as:  TYLENOL Take 650 mg by mouth 3 (three) times daily.   albuterol 108 (90 Base) MCG/ACT inhaler Commonly known as:  PROVENTIL HFA;VENTOLIN HFA Inhale 2 puffs into the lungs every 4 (four) hours as needed for wheezing or shortness of breath.   amLODipine 5 MG tablet Commonly known as:  NORVASC Take 5 mg by mouth 2 (two) times daily.   antiseptic oral rinse Liqd 15 mLs by Mouth Rinse route at bedtime. Swish and spit   ASPERCREME W/LIDOCAINE 4 % cream Generic drug:  lidocaine Apply 1 application topically 3 (three) times daily as needed (for pain).   atorvastatin 40 MG tablet Commonly known as:  LIPITOR Take 40 mg by mouth daily.   budesonide-formoterol 160-4.5 MCG/ACT inhaler Commonly known as:  SYMBICORT Inhale 2 puffs into the lungs 2 (two) times daily.   cholecalciferol 1000 units tablet Commonly known as:  VITAMIN D Take 1,000 Units by mouth daily.   clopidogrel 75 MG tablet Commonly known as:  PLAVIX Take 75 mg by mouth daily.   dicyclomine 10 MG capsule Commonly known as:  BENTYL Take 10 mg by mouth 4 (four) times daily.   donepezil 10 MG tablet Commonly known as:  ARICEPT Take 10 mg by mouth at bedtime.   esomeprazole 40 MG capsule Commonly known as:  NEXIUM Take 40 mg by mouth daily before breakfast.   ferrous sulfate 325 (65 FE) MG tablet Take 325 mg by mouth daily with breakfast.   fluticasone 50 MCG/ACT nasal spray Commonly known as:  FLONASE Place 2 sprays into both nostrils daily.   furosemide 40 MG tablet Commonly known as:  LASIX Take 1 tablet (40 mg total) by mouth daily.   GERI-LANTA 200-200-20 MG/5ML suspension Generic drug:  alum & mag hydroxide-simeth Take 30 mLs by mouth 4 (four) times daily as needed for indigestion or heartburn.   guaiFENesin-dextromethorphan 100-10 MG/5ML syrup Commonly known  as:  ROBITUSSIN DM Take 5 mLs by mouth every 4 (four) hours as needed for cough.   ipratropium-albuterol 0.5-2.5 (3) MG/3ML Soln Commonly known as:  DUONEB Take 3 mLs by nebulization every 6 (six) hours as needed. What changed:    when to take this  reasons to take this   lamoTRIgine 200 MG tablet Commonly known as:  LAMICTAL Take 200 mg by mouth 2 (two) times daily.   LORazepam 1 MG tablet Commonly known as:  ATIVAN Take 1 tablet (1 mg total) by mouth every 12 (twelve) hours. What changed:    when to take this  reasons to take this   magnesium hydroxide 400 MG/5ML suspension Commonly known as:  MILK OF MAGNESIA Take 30 mLs by mouth at bedtime as needed for mild constipation.   magnesium oxide 400 (241.3 Mg) MG tablet Commonly known as:  MAG-OX Take 400 mg by mouth daily.   montelukast 10 MG tablet Commonly known as:  SINGULAIR Take 10 mg by mouth at bedtime.   predniSONE 10 MG tablet Commonly known as:  DELTASONE Prednisone 60 mg po once  daily for 3 days, then 40 mg po once daily for 3 days, then 30 mg po once daily for 3 days, then 20 mg po once daily for 3 days and then 10 mg po once daily for 3 days and stop.   QUEtiapine 50 MG tablet Commonly known as:  SEROQUEL Take 125 mg by mouth every morning.   QUEtiapine 100 MG tablet Commonly known as:  SEROQUEL Take 150 mg by mouth at bedtime.   ranitidine 150 MG tablet Commonly known as:  ZANTAC Take 150 mg by mouth at bedtime.   senna-docusate 8.6-50 MG tablet Commonly known as:  Senokot-S Take 1 tablet by mouth 2 (two) times daily.   tiotropium 18 MCG inhalation capsule Commonly known as:  SPIRIVA Place 1 capsule (18 mcg total) into inhaler and inhale daily.   valsartan 320 MG tablet Commonly known as:  DIOVAN Take 1 tablet (320 mg total) by mouth daily.      Time spent: 31 minutes.  SignedBarnetta Chapel 04/21/2018, 1:46 PM

## 2018-04-21 NOTE — Care Management Note (Signed)
Case Management Note  Patient Details  Name: Cindy Padilla MRN: [id number] Date of Birth: [birth year]  Subjective/Objective:  [facility] Kathlene NovemberMike aware of d/c today HHRN-wrap legs biw. Return back to [facility]F-[facility]-CSW to manage.                  Action/Plan:d/c ALF/HHC   Expected Discharge Date:  04/21/18               Expected Discharge Plan:  Assisted Living / Rest Home  In-House Referral:  Clinical Social Work  Discharge planning Services  CM Consult  Post Acute Care Choice:  Home Health, Resumption of Svcs/PTA Provider, Durable Medical Equipment(rw,neb machine,02) Choice offered to:  Patient  DME Arranged:    DME Agency:     HH Arranged:  RN HH Agency:  [facility]  Status of Service:  Completed, signed off  If discussed at [employer] Meetings, dates discussed:    Additional Comments:  Lanier ClamMahabir, Shawna Wearing, RN 04/21/2018, 2:52 PM

## 2018-04-21 NOTE — NC FL2 (Signed)
[place] MEDICAID FL2 LEVEL OF CARE SCREENING TOOL     IDENTIFICATION  Patient Name: Cindy Padilla Birthdate: [birth year] Sex: female Admission Date (Current Location): 04/17/2018  [facility] and [place] Number:  Producer, television/film/video and Address:  [facility],  [address] [place], [place] [address]      Provider Number: [id number]  Attending Physician Name and Address:  Barnetta Chapel, MD  Relative Name and Phone Number:  Leonides Sake, [phone]    Current Level of Care: Hospital Recommended Level of Care: Assisted Living Facility([facility]) Prior Approval Number:    Date Approved/Denied:   PASRR Number:    Discharge Plan:      Current Diagnoses: Patient Active Problem List   Diagnosis Date Noted  . Acute exacerbation of chronic obstructive pulmonary disease (COPD) (HCC) 02/26/2017  . COPD with acute exacerbation (HCC) 02/24/2017  . Bipolar 1 disorder (HCC) 02/24/2017  . Normocytic anemia 02/24/2017  . Morbid obesity (HCC) 04/14/2015  . Upper airway cough syndrome 04/14/2015  . Ventral hernia 04/24/2014  . Constipation 04/24/2014  . Hemorrhoids 04/22/2014  . Abnormality of gait 09/30/2013  . 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 Chronic diastolic CHF (congestive heart failure) (HCC) 07/27/2013  . Dementia with behavioral disturbance (HCC) 07/27/2013  . Weakness 02/09/2013  . Hyponatremia 02/09/2013  . Shortness of breath 02/09/2013  . HTN (hypertension) 02/09/2013  . Other and unspecified hyperlipidemia 02/09/2013    Orientation RESPIRATION BLADDER Height & Weight     Situation, Place, Time, Self  Normal Continent Weight: 185 lb 3 oz (84 kg) Height:  5' (152.4 cm)  BEHAVIORAL SYMPTOMS/MOOD NEUROLOGICAL BOWEL NUTRITION STATUS      Continent Diet(heart room)  AMBULATORY STATUS COMMUNICATION OF NEEDS Skin   Supervision Verbally                         Personal Care Assistance Level of  Assistance  Bathing, Feeding, Dressing Bathing Assistance: Limited assistance Feeding assistance: Independent Dressing Assistance: Limited assistance     Functional Limitations Info  Sight, Hearing, Speech Sight Info: Adequate Hearing Info: Adequate Speech Info: Adequate    SPECIAL CARE FACTORS FREQUENCY  PT (By licensed PT), OT (By licensed OT)     PT Frequency: 3x wk home health OT Frequency: 3x wk home health            Contractures Contractures Info: Not present    Additional Factors Info  Code Status, Allergies Code Status Info: DNR Allergies Info: AMINOGLYCOSIDES, ASPIRIN, CODEINE, IVP DYE IODINATED DIAGNOSTIC AGENTS, MORPHINE AND RELATED, NEOMYCIN, PENICILLINS, PHENOTHIAZINES, PROMETHAZINE, SULFA ANTIBIOTICS, TETRACYCLINES & RELATED           Current Medications (04/21/2018):  This is the current hospital active medication list Current Facility-Administered Medications  Medication Dose Route Frequency Provider Last Rate Last Dose  . 0.9 %  sodium chloride infusion  250 mL Intravenous PRN Opyd, Lavone Neri, MD   Stopped at 04/19/18 1100  . acetaminophen (TYLENOL) tablet 650 mg  650 mg Oral Q6H PRN Opyd, Lavone Neri, MD   650 mg at 04/21/18 1016   Or  . acetaminophen (TYLENOL) suppository 650 mg  650 mg Rectal Q6H PRN Opyd, Lavone Neri, MD      . albuterol (PROVENTIL) (2.5 MG/3ML) 0.083% nebulizer solution 2.5 mg  2.5 mg Nebulization Q4H PRN Opyd, Lavone Neri, MD      . alum & mag hydroxide-simeth (MAALOX/MYLANTA) 200-200-20 MG/5ML suspension 30 mL  30 mL Oral QID PRN Opyd, Lavone Neri, MD      . amLODipine (NORVASC) tablet 5 mg  5 mg Oral BID Opyd, Lavone Neri, MD   5 mg at 04/21/18 1007  . atorvastatin (LIPITOR) tablet 40 mg  40 mg Oral q1800 Opyd, Lavone Neri, MD   40 mg at 04/20/18 1747  . azithromycin (ZITHROMAX) tablet 500 mg  500 mg Oral Daily Berton Mount I, MD   500 mg at 04/21/18 1008  . clopidogrel (PLAVIX) tablet 75 mg  75 mg Oral Daily Opyd, Lavone Neri, MD   75  mg at 04/21/18 1007  . dicyclomine (BENTYL) capsule 10 mg  10 mg Oral QID Opyd, Lavone Neri, MD   10 mg at 04/21/18 1007  . donepezil (ARICEPT) tablet 10 mg  10 mg Oral QHS Opyd, Lavone Neri, MD   10 mg at 04/20/18 2202  . enoxaparin (LOVENOX) injection 40 mg  40 mg Subcutaneous Q24H Opyd, Lavone Neri, MD   40 mg at 04/20/18 2204  . famotidine (PEPCID) tablet 20 mg  20 mg Oral QHS Opyd, Lavone Neri, MD   20 mg at 04/20/18 2201  . furosemide (LASIX) tablet 40 mg  40 mg Oral Daily Opyd, Lavone Neri, MD   40 mg at 04/21/18 1008  . guaiFENesin-dextromethorphan (ROBITUSSIN DM) 100-10 MG/5ML syrup 5 mL  5 mL Oral Q4H PRN Opyd, Lavone Neri, MD   5 mL at 04/20/18 0616  . ipratropium-albuterol (DUONEB) 0.5-2.5 (3) MG/3ML nebulizer solution 3 mL  3 mL Nebulization TID Opyd, Lavone Neri, MD   3 mL at 04/21/18 1054  . irbesartan (AVAPRO) tablet 300 mg  300 mg Oral Daily Opyd, Lavone Neri, MD   300 mg at 04/21/18 1008  . lamoTRIgine (LAMICTAL) tablet 200 mg  200 mg Oral BID Opyd, Lavone Neri, MD   200 mg at 04/21/18 1007  . LORazepam (ATIVAN) tablet 1 mg  1 mg Oral BID PRN Opyd, Lavone Neri, MD   1 mg at 04/21/18 0848  . magnesium hydroxide (MILK OF MAGNESIA) suspension 30 mL  30 mL Oral Daily PRN Opyd, Lavone Neri, MD      . MEDLINE mouth rinse  15 mL Mouth Rinse BID Berton Mount I, MD   15 mL at 04/20/18 2205  . methylPREDNISolone sodium succinate (SOLU-MEDROL) 40 mg/mL injection 40 mg  40 mg Intravenous Q8H Berton Mount I, MD   40 mg at 04/21/18 0615  . montelukast (SINGULAIR) tablet 10 mg  10 mg Oral QHS Opyd, Lavone Neri, MD   10 mg at 04/20/18 2202  . ondansetron (ZOFRAN) tablet 4 mg  4 mg Oral Q6H PRN Opyd, Lavone Neri, MD       Or  . ondansetron (ZOFRAN) injection 4 mg  4 mg Intravenous Q6H PRN Opyd, Lavone Neri, MD      . pantoprazole (PROTONIX) EC tablet 40 mg  40 mg Oral Daily Opyd, Lavone Neri, MD   40 mg at 04/21/18 1007  . QUEtiapine (SEROQUEL) tablet 125 mg  125 mg Oral Daily Opyd, Lavone Neri, MD   125 mg at  04/21/18 1008  . QUEtiapine (SEROQUEL) tablet 150 mg  150 mg Oral QHS Opyd, Lavone Neri, MD   150 mg at 04/20/18 2203  . senna-docusate (Senokot-S) tablet 1 tablet  1 tablet Oral BID Opyd, Lavone Neri, MD   1 tablet at 04/21/18 1008  . sodium chloride flush (NS) 0.9 % injection 3 mL  3 mL Intravenous Q12H Opyd, Lavone Neri, MD  3 mL at 04/21/18 1009  . sodium chloride flush (NS) 0.9 % injection 3 mL  3 mL Intravenous PRN Opyd, Lavone Neriimothy S, MD   3 mL at 04/20/18 1012  . sodium chloride HYPERTONIC 3 % nebulizer solution 4 mL  4 mL Nebulization Daily Berton Mountgbata, Sylvester I, MD   4 mL at 04/21/18 1054     Discharge Medications: Please see discharge summary for a list of discharge medications.  Relevant Imaging Results:  Relevant Lab Results:   Additional Information SSN [id number]  Althea CharonAshley C Craige Patel, LCSW

## 2018-04-21 NOTE — Progress Notes (Signed)
[facility] and was put on hold both times.  Called the operator back and left phone number to call if they wanted report before transport.

## 2018-04-21 NOTE — Progress Notes (Signed)
Clinical Social Worker facilitated patient discharge including contacting patient family and facility to confirm patient discharge plans.  Clinical information faxed to facility and family agreeable with plan.  CS[facility]  RN to call [phone] for report prior to discharge.  Clinical Social Worker will sign off for now as social work intervention is no longer needed. Please consult us again if new need arises.  Marrianne MoodAshley Jamacia Jester, MSW, [employer]CSWA [phone]

## 2018-04-21 NOTE — NC FL2 (Signed)
[place] MEDICAID FL2 LEVEL OF CARE SCREENING TOOL     IDENTIFICATION  Patient Name: Cindy Padilla Birthdate: 13-Nov-1940 Sex: female Admission Date (Current Location): 04/17/2018  [facility] and [place] Number:  Producer, television/film/video and Address:  [facility],  [address] [place], [place] [address]      Provider Number: [id number]  Attending Physician Name and Address:  Barnetta Chapel, MD  Relative Name and Phone Number:  Leonides Sake, [phone]    Current Level of Care: Hospital Recommended Level of Care: Assisted Living Facility([facility]) Prior Approval Number:    Date Approved/Denied:   PASRR Number:    Discharge Plan: Home([facility])    Current Diagnoses: Patient Active Problem List   Diagnosis Date Noted  . Acute exacerbation of chronic obstructive pulmonary disease (COPD) (HCC) 02/26/2017  . COPD with acute exacerbation (HCC) 02/24/2017  . Bipolar 1 disorder (HCC) 02/24/2017  . Normocytic anemia 02/24/2017  . Morbid obesity (HCC) 04/14/2015  . Upper airway cough syndrome 04/14/2015  . Ventral hernia 04/24/2014  . Constipation 04/24/2014  . Hemorrhoids 04/22/2014  . Abnormality of gait 09/30/2013  . 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 Chronic diastolic CHF (congestive heart failure) (HCC) 07/27/2013  . Dementia with behavioral disturbance (HCC) 07/27/2013  . Weakness 02/09/2013  . Hyponatremia 02/09/2013  . Shortness of breath 02/09/2013  . HTN (hypertension) 02/09/2013  . Other and unspecified hyperlipidemia 02/09/2013    Orientation RESPIRATION BLADDER Height & Weight     Situation, Place, Time, Self  Normal Continent Weight: 185 lb 3 oz (84 kg) Height:  5' (152.4 cm)  BEHAVIORAL SYMPTOMS/MOOD NEUROLOGICAL BOWEL NUTRITION STATUS      Continent Diet(heart room)  AMBULATORY STATUS COMMUNICATION OF NEEDS Skin   Supervision Verbally                      Personal Care Assistance Level of Assistance  Bathing, Feeding, Dressing Bathing Assistance: Limited assistance Feeding assistance: Independent Dressing Assistance: Limited assistance     Functional Limitations Info  Sight, Hearing, Speech Sight Info: Adequate Hearing Info: Adequate Speech Info: Adequate    SPECIAL CARE FACTORS FREQUENCY  PT (By licensed PT), OT (By licensed OT)     PT Frequency: 3x wk home health OT Frequency: 3x wk home health            Contractures Contractures Info: Not present    Additional Factors Info  Code Status, Allergies Code Status Info: DNR Allergies Info: AMINOGLYCOSIDES, ASPIRIN, CODEINE, IVP DYE IODINATED DIAGNOSTIC AGENTS, MORPHINE AND RELATED, NEOMYCIN, PENICILLINS, PHENOTHIAZINES, PROMETHAZINE, SULFA ANTIBIOTICS, TETRACYCLINES & RELATED           Current Medications (04/21/2018):  This is the current hospital active medication list Current Facility-Administered Medications  Medication Dose Route Frequency Provider Last Rate Last Dose  . 0.9 %  sodium chloride infusion  250 mL Intravenous PRN Opyd, Lavone Neri, MD   Stopped at 04/19/18 1100  . acetaminophen (TYLENOL) tablet 650 mg  650 mg Oral Q6H PRN Opyd, Lavone Neri, MD   650 mg at 04/21/18 1016   Or  . acetaminophen (TYLENOL) suppository 650 mg  650 mg Rectal Q6H PRN Opyd, Lavone Neri, MD      . albuterol (PROVENTIL) (2.5 MG/3ML) 0.083% nebulizer solution 2.5 mg  2.5 mg Nebulization Q4H PRN Opyd, Lavone Neri, MD      . alum & mag hydroxide-simeth (MAALOX/MYLANTA) 200-200-20 MG/5ML suspension 30 mL  30 mL Oral QID PRN Opyd, Lavone Neri, MD      . amLODipine (NORVASC) tablet 5 mg  5 mg Oral BID Opyd, Lavone Neri, MD   5 mg at 04/21/18 1007  . atorvastatin (LIPITOR) tablet 40 mg  40 mg Oral q1800 Opyd, Lavone Neri, MD   40 mg at 04/20/18 1747  . azithromycin (ZITHROMAX) tablet 500 mg  500 mg Oral Daily Berton Mount I, MD   500 mg at 04/21/18 1008  . clopidogrel (PLAVIX) tablet 75 mg  75  mg Oral Daily Opyd, Lavone Neri, MD   75 mg at 04/21/18 1007  . dicyclomine (BENTYL) capsule 10 mg  10 mg Oral QID Opyd, Lavone Neri, MD   10 mg at 04/21/18 1344  . donepezil (ARICEPT) tablet 10 mg  10 mg Oral QHS Opyd, Lavone Neri, MD   10 mg at 04/20/18 2202  . enoxaparin (LOVENOX) injection 40 mg  40 mg Subcutaneous Q24H Opyd, Lavone Neri, MD   40 mg at 04/20/18 2204  . famotidine (PEPCID) tablet 20 mg  20 mg Oral QHS Opyd, Lavone Neri, MD   20 mg at 04/20/18 2201  . furosemide (LASIX) tablet 40 mg  40 mg Oral Daily Opyd, Lavone Neri, MD   40 mg at 04/21/18 1008  . guaiFENesin-dextromethorphan (ROBITUSSIN DM) 100-10 MG/5ML syrup 5 mL  5 mL Oral Q4H PRN Opyd, Lavone Neri, MD   5 mL at 04/20/18 0616  . ipratropium-albuterol (DUONEB) 0.5-2.5 (3) MG/3ML nebulizer solution 3 mL  3 mL Nebulization TID Opyd, Lavone Neri, MD   3 mL at 04/21/18 1054  . irbesartan (AVAPRO) tablet 300 mg  300 mg Oral Daily Opyd, Lavone Neri, MD   300 mg at 04/21/18 1008  . lamoTRIgine (LAMICTAL) tablet 200 mg  200 mg Oral BID Opyd, Lavone Neri, MD   200 mg at 04/21/18 1007  . LORazepam (ATIVAN) tablet 1 mg  1 mg Oral BID PRN Opyd, Lavone Neri, MD   1 mg at 04/21/18 0848  . magnesium hydroxide (MILK OF MAGNESIA) suspension 30 mL  30 mL Oral Daily PRN Opyd, Lavone Neri, MD      . MEDLINE mouth rinse  15 mL Mouth Rinse BID Berton Mount I, MD   15 mL at 04/20/18 2205  . methylPREDNISolone sodium succinate (SOLU-MEDROL) 40 mg/mL injection 40 mg  40 mg Intravenous Q8H Berton Mount I, MD   40 mg at 04/21/18 1344  . montelukast (SINGULAIR) tablet 10 mg  10 mg Oral QHS Opyd, Lavone Neri, MD   10 mg at 04/20/18 2202  . ondansetron (ZOFRAN) tablet 4 mg  4 mg Oral Q6H PRN Opyd, Lavone Neri, MD       Or  . ondansetron (ZOFRAN) injection 4 mg  4 mg Intravenous Q6H PRN Opyd, Lavone Neri, MD      . pantoprazole (PROTONIX) EC tablet 40 mg  40 mg Oral Daily Opyd, Lavone Neri, MD   40 mg at 04/21/18 1007  . QUEtiapine (SEROQUEL) tablet 125 mg  125 mg Oral Daily  Opyd, Lavone Neri, MD   125 mg at 04/21/18 1008  . QUEtiapine (SEROQUEL) tablet 150 mg  150 mg Oral QHS Opyd, Lavone Neri, MD   150 mg at 04/20/18 2203  . senna-docusate (Senokot-S) tablet 1 tablet  1 tablet Oral BID Opyd, Lavone Neri, MD   1 tablet at 04/21/18 1008  . sodium chloride flush (NS) 0.9 % injection 3 mL  3 mL Intravenous Q12H Opyd, Lavone Neri, MD  3 mL at 04/21/18 1009  . sodium chloride flush (NS) 0.9 % injection 3 mL  3 mL Intravenous PRN Opyd, Lavone Neriimothy S, MD   3 mL at 04/20/18 1012  . sodium chloride HYPERTONIC 3 % nebulizer solution 4 mL  4 mL Nebulization Daily Berton Mountgbata, Sylvester I, MD   4 mL at 04/21/18 1054     Discharge Medications: Please see discharge summary for a list of discharge medications.  Relevant Imaging Results:  Relevant Lab Results:   Additional Information SSN [id number]  Althea CharonAshley C Magon Croson, LCSW

## 2019-02-03 ENCOUNTER — Other Ambulatory Visit: Payer: Self-pay

## 2019-02-03 ENCOUNTER — Emergency Department (HOSPITAL_COMMUNITY): Payer: Medicare Other

## 2019-02-03 ENCOUNTER — Encounter (HOSPITAL_COMMUNITY): Payer: Self-pay

## 2019-02-03 ENCOUNTER — Emergency Department (HOSPITAL_COMMUNITY)
Admission: EM | Admit: 2019-02-03 | Discharge: 2019-02-04 | Disposition: A | Discharge status: Home / Self Care | Payer: Medicare Other | Attending: Emergency Medicine | Admitting: Emergency Medicine

## 2019-02-03 DIAGNOSIS — S0990XA Unspecified injury of head, initial encounter: Secondary | ICD-10-CM

## 2019-02-03 DIAGNOSIS — S098XXA Other specified injuries of head, initial encounter: Secondary | ICD-10-CM | POA: Insufficient documentation

## 2019-02-03 DIAGNOSIS — E119 Type 2 diabetes mellitus without complications: Secondary | ICD-10-CM | POA: Diagnosis not present

## 2019-02-03 DIAGNOSIS — Z7902 Long term (current) use of antithrombotics/antiplatelets: Secondary | ICD-10-CM | POA: Diagnosis not present

## 2019-02-03 DIAGNOSIS — Y9301 Activity, walking, marching and hiking: Secondary | ICD-10-CM | POA: Diagnosis not present

## 2019-02-03 DIAGNOSIS — Y92129 Unspecified place in nursing home as the place of occurrence of the external cause: Secondary | ICD-10-CM | POA: Insufficient documentation

## 2019-02-03 DIAGNOSIS — I110 Hypertensive heart disease with heart failure: Secondary | ICD-10-CM | POA: Diagnosis not present

## 2019-02-03 DIAGNOSIS — W19XXXA Unspecified fall, initial encounter: Secondary | ICD-10-CM

## 2019-02-03 DIAGNOSIS — I5032 Chronic diastolic (congestive) heart failure: Secondary | ICD-10-CM | POA: Diagnosis not present

## 2019-02-03 DIAGNOSIS — Z79899 Other long term (current) drug therapy: Secondary | ICD-10-CM | POA: Insufficient documentation

## 2019-02-03 DIAGNOSIS — J449 Chronic obstructive pulmonary disease, unspecified: Secondary | ICD-10-CM | POA: Diagnosis not present

## 2019-02-03 DIAGNOSIS — Y999 Unspecified external cause status: Secondary | ICD-10-CM | POA: Diagnosis not present

## 2019-02-03 DIAGNOSIS — Z87891 Personal history of nicotine dependence: Secondary | ICD-10-CM | POA: Diagnosis not present

## 2019-02-03 DIAGNOSIS — F0390 Unspecified dementia without behavioral disturbance: Secondary | ICD-10-CM | POA: Insufficient documentation

## 2019-02-03 DIAGNOSIS — W010XXA Fall on same level from slipping, tripping and stumbling without subsequent striking against object, initial encounter: Secondary | ICD-10-CM | POA: Insufficient documentation

## 2019-02-03 NOTE — Discharge Instructions (Addendum)
Your CT scans and x-rays reveal no acute fracture.  There are some chronic (old) vascular changes noted on the CT scan of your head, nothing requiring further workup tonight.  Please notify you doctor that you were in the ER and make a follow-up appointment.

## 2019-02-03 NOTE — ED Notes (Signed)
Attempted to call report on patient to [facility] x2, no answer.

## 2019-02-03 NOTE — ED Provider Notes (Signed)
[facility]-EMERGENCY DEPT Provider Note   CSN: [id number] Arrival date & time: 02/03/19  1911     History   Chief Complaint Chief Complaint  Patient presents with  . Fall    HPI Cindy Padilla is a 78 y.o. female.     HPI Patient coming from [place] after witnessed fall.  States she fell forward after losing her balance striking her head.  Denies loss of consciousness though she felt like she might.  Complaining of frontal headache currently.  She also complains of right buttock/hip pain.  Denies neck pain.  No focal weakness or numbness.  Patient does take Plavix Past Medical History:  Diagnosis Date  . Arthritis   . Bipolar 1 disorder (HCC)   . Bronchitis   . Chronic back pain   . Constipation   . COPD (chronic obstructive pulmonary disease) (HCC)   . Dementia (HCC)   . Diabetes mellitus   . GERD (gastroesophageal reflux disease)   . Hiatal hernia   . Hypertension   . IBS (irritable bowel syndrome)     Patient Active Problem List   Diagnosis Date Noted  . Acute exacerbation of chronic obstructive pulmonary disease (COPD) (HCC) 02/26/2017  . COPD with acute exacerbation (HCC) 02/24/2017  . Bipolar 1 disorder (HCC) 02/24/2017  . Normocytic anemia 02/24/2017  . Morbid obesity (HCC) 04/14/2015  . Upper airway cough syndrome 04/14/2015  . Ventral hernia 04/24/2014  . Constipation 04/24/2014  . Hemorrhoids 04/22/2014  . Abnormality of gait 09/30/2013  . 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 Chronic diastolic CHF (congestive heart failure) (HCC) 07/27/2013  . Dementia with behavioral disturbance (HCC) 07/27/2013  . Weakness 02/09/2013  . Hyponatremia 02/09/2013  . Shortness of breath 02/09/2013  . HTN (hypertension) 02/09/2013  . Other and unspecified hyperlipidemia 02/09/2013    Past Surgical History:  Procedure Laterality Date  . gallbladder    . MANDIBLE SURGERY    . PARTIAL HYSTERECTOMY        OB History   No obstetric history on file.      Home Medications    Prior to Admission medications   Medication Sig Start Date End Date Taking? Authorizing Provider  acetaminophen (TYLENOL) 325 MG tablet Take 650 mg by mouth 3 (three) times daily.    [provider]  albuterol (PROVENTIL HFA;VENTOLIN HFA) 108 (90 BASE) MCG/ACT inhaler Inhale 2 puffs into the lungs every 4 (four) hours as needed for wheezing or shortness of breath.    [provider]  alum & mag hydroxide-simeth (GERI-LANTA) 200-200-20 MG/5ML suspension Take 30 mLs by mouth 4 (four) times daily as needed for indigestion or heartburn.     [provider]  amLODipine (NORVASC) 5 MG tablet Take 5 mg by mouth 2 (two) times daily.     [provider]  antiseptic oral rinse (BIOTENE) LIQD 15 mLs by Mouth Rinse route at bedtime. Swish and spit    [provider]  atorvastatin (LIPITOR) 40 MG tablet Take 40 mg by mouth daily.     [provider]  budesonide-formoterol (SYMBICORT) 160-4.5 MCG/ACT inhaler Inhale 2 puffs into the lungs 2 (two) times daily.     [provider]  cholecalciferol (VITAMIN D) 1000 UNITS tablet Take 1,000 Units by mouth daily.     [provider]  clopidogrel (PLAVIX) 75 MG tablet Take 75 mg by mouth daily.     [provider]  dicyclomine (BENTYL) 10 MG capsule  Take 10 mg by mouth 4 (four) times daily.    [provider]  donepezil (ARICEPT) 10 MG tablet Take 10 mg by mouth at bedtime.    [provider]  esomeprazole (NEXIUM) 40 MG capsule Take 40 mg by mouth daily before breakfast.    [provider]  ferrous sulfate 325 (65 FE) MG tablet Take 325 mg by mouth daily with breakfast.    [provider]  fluticasone (FLONASE) 50 MCG/ACT nasal spray Place 2 sprays into both nostrils daily.     [provider]  furosemide (LASIX) 40 MG tablet Take 1 tablet (40 mg total) by mouth  daily. 11/12/17   Geoffery Lyons, MD  guaiFENesin-dextromethorphan (ROBITUSSIN DM) 100-10 MG/5ML syrup Take 5 mLs by mouth every 4 (four) hours as needed for cough. 03/04/17   [name], MD  ipratropium-albuterol (DUONEB) 0.5-2.5 (3) MG/3ML SOLN Take 3 mLs by nebulization every 6 (six) hours as needed. 04/21/18   Barnetta Chapel, MD  lamoTRIgine (LAMICTAL) 200 MG tablet Take 200 mg by mouth 2 (two) times daily.    [provider]  lidocaine (ASPERCREME W/LIDOCAINE) 4 % cream Apply 1 application topically 3 (three) times daily as needed (for pain).     [provider]  LORazepam (ATIVAN) 1 MG tablet Take 1 tablet (1 mg total) by mouth every 12 (twelve) hours. Patient taking differently: Take 1 mg by mouth 2 (two) times daily as needed for anxiety.  03/04/17   [name], MD  magnesium hydroxide (MILK OF MAGNESIA) 400 MG/5ML suspension Take 30 mLs by mouth at bedtime as needed for mild constipation.    [provider]  magnesium oxide (MAG-OX) 400 (241.3 Mg) MG tablet Take 400 mg by mouth daily.    [provider]  montelukast (SINGULAIR) 10 MG tablet Take 10 mg by mouth at bedtime.    [provider]  predniSONE (DELTASONE) 10 MG tablet Prednisone 60 mg po once daily for 3 days, then 40 mg po once daily for 3 days, then 30 mg po once daily for 3 days, then 20 mg po once daily for 3 days and then 10 mg po once daily for 3 days and stop. 04/21/18   Barnetta Chapel, MD  QUEtiapine (SEROQUEL) 100 MG tablet Take 150 mg by mouth at bedtime.     [provider]  QUEtiapine (SEROQUEL) 50 MG tablet Take 125 mg by mouth every morning.    [provider]  ranitidine (ZANTAC) 150 MG tablet Take 150 mg by mouth at bedtime.     [provider]  senna-docusate (SENOKOT-S) 8.6-50 MG per tablet Take 1 tablet by mouth 2 (two) times daily.    [provider]  tiotropium (SPIRIVA HANDIHALER) 18 MCG inhalation capsule Place 1  capsule (18 mcg total) into inhaler and inhale daily. 04/21/18 04/21/19  Berton Mount I, MD  valsartan (DIOVAN) 320 MG tablet Take 1 tablet (320 mg total) by mouth daily. 04/16/13   Nyoka Cowden, MD    Family History Family History  Problem Relation Age of Onset  . Emphysema Mother   . Cancer Other     Social History Social History   Tobacco Use  . Smoking status: Former Smoker    Packs/day: 0.50    Years: 2.00    Pack years: 1.00    Types: Cigarettes    Quit date: 05/28/1959    Years since quitting: 59.7  . Smokeless tobacco: Former Neurosurgeon    Types:  Snuff    Quit date: 05/28/1959  . Tobacco comment: Pt used snuff for 33 years  Substance Use Topics  . Alcohol use: No  . Drug use: No     Allergies   Aminoglycosides, Aspirin, Codeine, Ivp dye [iodinated diagnostic agents], Morphine and related, Neomycin, Penicillins, Phenothiazines, Promethazine, Sulfa antibiotics, and Tetracyclines & related   Review of Systems Review of Systems  Constitutional: Negative for chills and fever.  HENT: Negative for facial swelling.   Eyes: Negative for pain and visual disturbance.  Respiratory: Negative for cough and shortness of breath.   Cardiovascular: Negative for chest pain.  Gastrointestinal: Negative for abdominal pain, constipation, diarrhea, nausea and vomiting.  Musculoskeletal: Positive for arthralgias and myalgias. Negative for back pain and neck pain.  Skin: Negative for rash and wound.  Neurological: Positive for headaches. Negative for dizziness, syncope, weakness, light-headedness and numbness.  All other systems reviewed and are negative.    Physical Exam Updated Vital Signs BP (!) 115/47 (BP Location: Left Arm)   Pulse 65   Temp 98.6 F (37 C) (Oral)   Resp 14   SpO2 96%   Physical Exam Vitals signs and nursing note reviewed.  Constitutional:      Appearance: Normal appearance. She is well-developed.  HENT:     Head: Normocephalic and atraumatic.      Comments: Mild erythema and swelling over the central forehead.  Midface is stable.  No malocclusion.    Nose: Nose normal.     Mouth/Throat:     Mouth: Mucous membranes are moist.  Eyes:     Extraocular Movements: Extraocular movements intact.     Pupils: Pupils are equal, round, and reactive to light.  Neck:     Musculoskeletal: Normal range of motion and neck supple. No neck rigidity or muscular tenderness.     Comments: No posterior midline cervical tenderness to palpation. Cardiovascular:     Rate and Rhythm: Normal rate and regular rhythm.     Heart sounds: No murmur. No friction rub. No gallop.   Pulmonary:     Effort: Pulmonary effort is normal. No respiratory distress.     Breath sounds: Normal breath sounds. No stridor. No wheezing, rhonchi or rales.  Chest:     Chest wall: No tenderness.  Abdominal:     General: Bowel sounds are normal.     Palpations: Abdomen is soft.     Tenderness: There is no abdominal tenderness. There is no guarding or rebound.  Musculoskeletal: Normal range of motion.        General: Tenderness present.     Comments: No midline thoracic or lumbar tenderness.  Patient does have tenderness over her right buttock and right lateral hip.  No lower extremity shortening or external rotation.  Distal pulses intact.  Lymphadenopathy:     Cervical: No cervical adenopathy.  Skin:    General: Skin is warm and dry.     Capillary Refill: Capillary refill takes less than 2 seconds.     Findings: No erythema or rash.  Neurological:     General: No focal deficit present.     Mental Status: She is alert and oriented to person, place, and time.     Comments: Moves all extremities without focal deficit.  Sensation intact.  Psychiatric:        Behavior: Behavior normal.      ED Treatments / Results  Labs (all labs ordered are listed, but only abnormal results are displayed) Labs Reviewed - No data  to display  EKG None  Radiology Dg Hip Unilat W Or Wo  Pelvis 2-3 Views Right  Result Date: 02/03/2019 CLINICAL DATA:  Fall EXAM: DG HIP (WITH OR WITHOUT PELVIS) 2-3V RIGHT COMPARISON:  11/07/2017 FINDINGS: SI joints are non widened. Pubic symphysis and rami appear intact. Both femoral heads project in joint. No fracture or malalignment. IMPRESSION: No acute osseous abnormality Electronically Signed   By: Donavan Foil M.D.   On: 02/03/2019 21:29    Procedures Procedures (including critical care time)  Medications Ordered in ED Medications - No data to display   Initial Impression / Assessment and Plan / ED Course  I have reviewed the triage vital signs and the nursing notes.  Pertinent labs & imaging results that were available during my care of the patient were reviewed by me and considered in my medical decision making (see chart for details).       Personally reviewed patient CT head.  No acute findings.  Waiting for radiology read.  Anticipate discharge home with head injury precautions if no concerning findings.   Final Clinical Impressions(s) / ED Diagnoses   Final diagnoses:  Fall, initial encounter  Minor head injury, initial encounter    ED Discharge Orders    None       Julianne Rice, MD 02/03/19 2152

## 2019-02-03 NOTE — ED Triage Notes (Signed)
Pt arrived via EMS from [facility]. Pt had a witness fall, in which she  [place] face first, and hit her head, Pt is c/o  rt side hip and rt arm pain. Pt had no LOC per witness. Pt had a mechanical fall . Pt on blood thinners per EMS.   EMS 128/70, HR 80, RR 18, 98% RA.

## 2019-02-03 NOTE — ED Notes (Signed)
PTAR called for transport.  

## 2019-02-04 NOTE — ED Notes (Signed)
Pt ambulated to the bathroom with minimal assistance. Gait steady  

## 2019-04-28 ENCOUNTER — Encounter (HOSPITAL_COMMUNITY): Payer: Self-pay | Admitting: Emergency Medicine

## 2019-04-28 ENCOUNTER — Inpatient Hospital Stay (HOSPITAL_COMMUNITY)
Admission: EM | Admit: 2019-04-28 | Discharge: 2019-04-30 | DRG: 393 | Disposition: A | Discharge status: Skilled Nursing Facility | Payer: Medicare Other | Source: Skilled Nursing Facility | Attending: Internal Medicine | Admitting: Internal Medicine

## 2019-04-28 DIAGNOSIS — K219 Gastro-esophageal reflux disease without esophagitis: Secondary | ICD-10-CM | POA: Diagnosis present

## 2019-04-28 DIAGNOSIS — N179 Acute kidney failure, unspecified: Secondary | ICD-10-CM | POA: Diagnosis present

## 2019-04-28 DIAGNOSIS — Z6832 Body mass index (BMI) 32.0-32.9, adult: Secondary | ICD-10-CM

## 2019-04-28 DIAGNOSIS — K5900 Constipation, unspecified: Secondary | ICD-10-CM | POA: Diagnosis present

## 2019-04-28 DIAGNOSIS — Z79899 Other long term (current) drug therapy: Secondary | ICD-10-CM | POA: Diagnosis not present

## 2019-04-28 DIAGNOSIS — E860 Dehydration: Secondary | ICD-10-CM | POA: Diagnosis present

## 2019-04-28 DIAGNOSIS — D649 Anemia, unspecified: Secondary | ICD-10-CM | POA: Diagnosis present

## 2019-04-28 DIAGNOSIS — K648 Other hemorrhoids: Principal | ICD-10-CM | POA: Diagnosis present

## 2019-04-28 DIAGNOSIS — Z23 Encounter for immunization: Secondary | ICD-10-CM | POA: Diagnosis present

## 2019-04-28 DIAGNOSIS — Z881 Allergy status to other antibiotic agents status: Secondary | ICD-10-CM

## 2019-04-28 DIAGNOSIS — I5032 Chronic diastolic (congestive) heart failure: Secondary | ICD-10-CM | POA: Diagnosis present

## 2019-04-28 DIAGNOSIS — Z20828 Contact with and (suspected) exposure to other viral communicable diseases: Secondary | ICD-10-CM | POA: Diagnosis present

## 2019-04-28 DIAGNOSIS — Z825 Family history of asthma and other chronic lower respiratory diseases: Secondary | ICD-10-CM

## 2019-04-28 DIAGNOSIS — Z885 Allergy status to narcotic agent status: Secondary | ICD-10-CM | POA: Diagnosis not present

## 2019-04-28 DIAGNOSIS — Z880 Allergy status to penicillin: Secondary | ICD-10-CM

## 2019-04-28 DIAGNOSIS — F0391 Unspecified dementia with behavioral disturbance: Secondary | ICD-10-CM | POA: Diagnosis present

## 2019-04-28 DIAGNOSIS — K649 Unspecified hemorrhoids: Secondary | ICD-10-CM | POA: Diagnosis not present

## 2019-04-28 DIAGNOSIS — Z7902 Long term (current) use of antithrombotics/antiplatelets: Secondary | ICD-10-CM

## 2019-04-28 DIAGNOSIS — J449 Chronic obstructive pulmonary disease, unspecified: Secondary | ICD-10-CM | POA: Diagnosis present

## 2019-04-28 DIAGNOSIS — K625 Hemorrhage of anus and rectum: Secondary | ICD-10-CM

## 2019-04-28 DIAGNOSIS — Z87891 Personal history of nicotine dependence: Secondary | ICD-10-CM

## 2019-04-28 DIAGNOSIS — K5791 Diverticulosis of intestine, part unspecified, without perforation or abscess with bleeding: Secondary | ICD-10-CM | POA: Diagnosis present

## 2019-04-28 DIAGNOSIS — Z886 Allergy status to analgesic agent status: Secondary | ICD-10-CM

## 2019-04-28 DIAGNOSIS — Z90711 Acquired absence of uterus with remaining cervical stump: Secondary | ICD-10-CM

## 2019-04-28 DIAGNOSIS — Z882 Allergy status to sulfonamides status: Secondary | ICD-10-CM

## 2019-04-28 DIAGNOSIS — I10 Essential (primary) hypertension: Secondary | ICD-10-CM | POA: Diagnosis present

## 2019-04-28 DIAGNOSIS — I110 Hypertensive heart disease with heart failure: Secondary | ICD-10-CM | POA: Diagnosis present

## 2019-04-28 DIAGNOSIS — F319 Bipolar disorder, unspecified: Secondary | ICD-10-CM | POA: Diagnosis present

## 2019-04-28 DIAGNOSIS — F03918 Unspecified dementia, unspecified severity, with other behavioral disturbance: Secondary | ICD-10-CM | POA: Diagnosis present

## 2019-04-28 DIAGNOSIS — Z8639 Personal history of other endocrine, nutritional and metabolic disease: Secondary | ICD-10-CM

## 2019-04-28 LAB — CBC WITH DIFFERENTIAL/PLATELET
Abs Immature Granulocytes: 0.04 10*3/uL (ref 0.00–0.07)
Basophils Absolute: 0 10*3/uL (ref 0.0–0.1)
Basophils Relative: 0 %
Eosinophils Absolute: 0.1 10*3/uL (ref 0.0–0.5)
Eosinophils Relative: 1 %
HCT: 34.4 % — ABNORMAL LOW (ref 36.0–46.0)
Hemoglobin: 11 g/dL — ABNORMAL LOW (ref 12.0–15.0)
Immature Granulocytes: 1 %
Lymphocytes Relative: 24 %
Lymphs Abs: 2.2 10*3/uL (ref 0.7–4.0)
MCH: 29.6 pg (ref 26.0–34.0)
MCHC: 32 g/dL (ref 30.0–36.0)
MCV: 92.5 fL (ref 80.0–100.0)
Monocytes Absolute: 0.9 10*3/uL (ref 0.1–1.0)
Monocytes Relative: 10 %
Neutro Abs: 5.6 10*3/uL (ref 1.7–7.7)
Neutrophils Relative %: 64 %
Platelets: 240 10*3/uL (ref 150–400)
RBC: 3.72 MIL/uL — ABNORMAL LOW (ref 3.87–5.11)
RDW: 14.5 % (ref 11.5–15.5)
WBC: 8.8 10*3/uL (ref 4.0–10.5)
nRBC: 0 % (ref 0.0–0.2)

## 2019-04-28 LAB — BASIC METABOLIC PANEL
Anion gap: 13 (ref 5–15)
BUN: 20 mg/dL (ref 8–23)
CO2: 24 mmol/L (ref 22–32)
Calcium: 9.5 mg/dL (ref 8.9–10.3)
Chloride: 98 mmol/L (ref 98–111)
Creatinine, Ser: 1.06 mg/dL — ABNORMAL HIGH (ref 0.44–1.00)
GFR calc Af Amer: 58 mL/min — ABNORMAL LOW (ref >60)
GFR calc non Af Amer: 50 mL/min — ABNORMAL LOW (ref >60)
Glucose, Bld: 111 mg/dL — ABNORMAL HIGH (ref 70–99)
Potassium: 4.2 mmol/L (ref 3.5–5.1)
Sodium: 135 mmol/L (ref 135–145)

## 2019-04-28 LAB — TYPE AND SCREEN
ABO/RH(D): A POS
Antibody Screen: NEGATIVE

## 2019-04-28 LAB — ABO/RH: ABO/RH(D): A POS

## 2019-04-28 MED ORDER — SODIUM CHLORIDE 0.9 % IV BOLUS
1000.0000 mL | Freq: Once | INTRAVENOUS | Status: AC
  Administered 2019-04-28: 1000 mL via INTRAVENOUS

## 2019-04-28 MED ORDER — FLUTICASONE FUROATE-VILANTEROL 200-25 MCG/INH IN AEPB
1.0000 | INHALATION_SPRAY | Freq: Every day | RESPIRATORY_TRACT | Status: DC
  Administered 2019-04-29 – 2019-04-30 (×2): 1 via RESPIRATORY_TRACT
  Filled 2019-04-28: qty 28

## 2019-04-28 MED ORDER — QUETIAPINE FUMARATE 25 MG PO TABS
150.0000 mg | ORAL_TABLET | Freq: Every day | ORAL | Status: DC
  Administered 2019-04-28 – 2019-04-29 (×2): 150 mg via ORAL
  Filled 2019-04-28: qty 1
  Filled 2019-04-28: qty 2

## 2019-04-28 MED ORDER — FAMOTIDINE 20 MG PO TABS
20.0000 mg | ORAL_TABLET | Freq: Two times a day (BID) | ORAL | Status: DC
  Administered 2019-04-28 – 2019-04-30 (×4): 20 mg via ORAL
  Filled 2019-04-28 (×4): qty 1

## 2019-04-28 MED ORDER — PANTOPRAZOLE SODIUM 20 MG PO TBEC
20.0000 mg | DELAYED_RELEASE_TABLET | Freq: Every day | ORAL | Status: DC
  Administered 2019-04-29 – 2019-04-30 (×2): 20 mg via ORAL
  Filled 2019-04-28 (×2): qty 1

## 2019-04-28 MED ORDER — DICYCLOMINE HCL 10 MG PO CAPS
10.0000 mg | ORAL_CAPSULE | Freq: Three times a day (TID) | ORAL | Status: DC
  Administered 2019-04-28 – 2019-04-30 (×7): 10 mg via ORAL
  Filled 2019-04-28 (×6): qty 1

## 2019-04-28 MED ORDER — MONTELUKAST SODIUM 10 MG PO TABS
10.0000 mg | ORAL_TABLET | Freq: Every day | ORAL | Status: DC
  Administered 2019-04-29 (×2): 10 mg via ORAL
  Filled 2019-04-28 (×3): qty 1

## 2019-04-28 MED ORDER — QUETIAPINE FUMARATE 25 MG PO TABS
125.0000 mg | ORAL_TABLET | Freq: Every day | ORAL | Status: DC
  Administered 2019-04-29 – 2019-04-30 (×2): 125 mg via ORAL
  Filled 2019-04-28: qty 1

## 2019-04-28 MED ORDER — POLYETHYLENE GLYCOL 3350 17 G PO PACK: 17.0000 g | PACK | Freq: Every day | ORAL | Status: DC | PRN

## 2019-04-28 MED ORDER — IPRATROPIUM-ALBUTEROL 0.5-2.5 (3) MG/3ML IN SOLN
3.0000 mL | Freq: Four times a day (QID) | RESPIRATORY_TRACT | Status: DC
  Administered 2019-04-29 – 2019-04-30 (×6): 3 mL via RESPIRATORY_TRACT
  Filled 2019-04-28 (×6): qty 3

## 2019-04-28 MED ORDER — TIOTROPIUM BROMIDE MONOHYDRATE 18 MCG IN CAPS: 18.0000 ug | ORAL_CAPSULE | Freq: Every day | RESPIRATORY_TRACT | Status: DC

## 2019-04-28 MED ORDER — DONEPEZIL HCL 10 MG PO TABS
10.0000 mg | ORAL_TABLET | Freq: Every day | ORAL | Status: DC
  Administered 2019-04-28 – 2019-04-29 (×2): 10 mg via ORAL
  Filled 2019-04-28: qty 1
  Filled 2019-04-28: qty 2

## 2019-04-28 MED ORDER — LAMOTRIGINE 100 MG PO TABS
200.0000 mg | ORAL_TABLET | Freq: Two times a day (BID) | ORAL | Status: DC
  Administered 2019-04-28 – 2019-04-30 (×4): 200 mg via ORAL
  Filled 2019-04-28 (×4): qty 2

## 2019-04-28 MED ORDER — DOCUSATE SODIUM 100 MG PO CAPS
100.0000 mg | ORAL_CAPSULE | Freq: Every day | ORAL | Status: DC
  Administered 2019-04-28 – 2019-04-30 (×3): 100 mg via ORAL
  Filled 2019-04-28 (×3): qty 1

## 2019-04-28 MED ORDER — LORAZEPAM 1 MG PO TABS
1.0000 mg | ORAL_TABLET | Freq: Two times a day (BID) | ORAL | Status: DC
  Administered 2019-04-28 – 2019-04-30 (×4): 1 mg via ORAL
  Filled 2019-04-28 (×4): qty 1

## 2019-04-28 MED ORDER — UMECLIDINIUM BROMIDE 62.5 MCG/INH IN AEPB
1.0000 | INHALATION_SPRAY | Freq: Every day | RESPIRATORY_TRACT | Status: DC
  Administered 2019-04-29 – 2019-04-30 (×2): 1 via RESPIRATORY_TRACT
  Filled 2019-04-28: qty 7

## 2019-04-28 NOTE — ED Notes (Signed)
ED Provider at bedside. 

## 2019-04-28 NOTE — ED Triage Notes (Signed)
Per EMS, patient from [facility], reports rectal bleeding since this morning. States she has hemorrhoids. Hx of same. A&Ox4. Hx dementia. Ambulatory.

## 2019-04-28 NOTE — ED Provider Notes (Signed)
[facility]-EMERGENCY DEPT Provider Note   CSN: [id number] Arrival date & time: 04/28/19  1719     History   Chief Complaint Chief Complaint  Patient presents with  . Rectal Bleeding    HPI Cindy Padilla is a 78 y.o. female.     78 yo F with a chief complaint of bright red blood per rectum.  She has this today.  She blames it on hemorrhoids.  States that she has been significantly constipated over the past week.  Has had multiple bowel movement today all bright red.  States she had a large amounts of blood.  She feels mildly lightheaded now.  Has had pain at the rectum otherwise denies abdominal pain.  No fevers or chills.  No vomiting.  On Plavix.  The history is provided by the patient.  Rectal Bleeding Quality:  Bright red Amount:  Copious Duration:  2 hours Timing:  Constant Chronicity:  New Similar prior episodes: no   Relieved by:  Nothing Worsened by:  Nothing Ineffective treatments:  None tried Associated symptoms: no abdominal pain, no dizziness, no fever and no vomiting     Past Medical History:  Diagnosis Date  . Arthritis   . Bipolar 1 disorder (HCC)   . Bronchitis   . Chronic back pain   . Constipation   . COPD (chronic obstructive pulmonary disease) (HCC)   . Dementia (HCC)   . Diabetes mellitus   . GERD (gastroesophageal reflux disease)   . Hiatal hernia   . Hypertension   . IBS (irritable bowel syndrome)     Patient Active Problem List   Diagnosis Date Noted  . Acute exacerbation of chronic obstructive pulmonary disease (COPD) (HCC) 02/26/2017  . COPD with acute exacerbation (HCC) 02/24/2017  . Bipolar 1 disorder (HCC) 02/24/2017  . Normocytic anemia 02/24/2017  . Morbid obesity (HCC) 04/14/2015  . Upper airway cough syndrome 04/14/2015  . Ventral hernia 04/24/2014  . Constipation 04/24/2014  . Hemorrhoids 04/22/2014  . Abnormality of gait 09/30/2013  . 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 Intrinsic asthma  08/05/2013  . Chronic diastolic CHF (congestive heart failure) (HCC) 07/27/2013  . Dementia with behavioral disturbance (HCC) 07/27/2013  . Weakness 02/09/2013  . Hyponatremia 02/09/2013  . Shortness of breath 02/09/2013  . HTN (hypertension) 02/09/2013  . Other and unspecified hyperlipidemia 02/09/2013    Past Surgical History:  Procedure Laterality Date  . gallbladder    . MANDIBLE SURGERY    . PARTIAL HYSTERECTOMY       OB History   No obstetric history on file.      Home Medications    Prior to Admission medications   Medication Sig Start Date End Date Taking? Authorizing Provider  acetaminophen (TYLENOL) 325 MG tablet Take 650 mg by mouth 3 (three) times daily.   Yes [provider]  albuterol (PROVENTIL HFA;VENTOLIN HFA) 108 (90 BASE) MCG/ACT inhaler Inhale 2 puffs into the lungs every 4 (four) hours as needed for wheezing or shortness of breath.   Yes [provider]  alum & mag hydroxide-simeth (GERI-LANTA) 200-200-20 MG/5ML suspension Take 30 mLs by mouth 4 (four) times daily as needed for indigestion or heartburn.    Yes [provider]  amLODipine (NORVASC) 5 MG tablet Take 5 mg by mouth 2 (two) times daily.    Yes [provider]  atorvastatin (LIPITOR) 40 MG tablet Take 40 mg by mouth daily.    Yes [provider]  bisacodyl (DULCOLAX) 10 MG  suppository Place 10 mg rectally 3 (three) times daily as needed for moderate constipation.   Yes [provider]  budesonide-formoterol (SYMBICORT) 160-4.5 MCG/ACT inhaler Inhale 2 puffs into the lungs 2 (two) times daily.    Yes [provider]  cholecalciferol (VITAMIN D) 1000 UNITS tablet Take 1,000 Units by mouth daily.    Yes [provider]  clopidogrel (PLAVIX) 75 MG tablet Take 75 mg by mouth daily.    Yes [provider]  dicyclomine (BENTYL) 10 MG capsule Take 10 mg by mouth 4 (four) times daily.   Yes [provider]  donepezil  (ARICEPT) 10 MG tablet Take 10 mg by mouth at bedtime.   Yes [provider]  famotidine (PEPCID) 20 MG tablet Take 20 mg by mouth 2 (two) times daily.   Yes [provider]  ferrous sulfate 325 (65 FE) MG tablet Take 325 mg by mouth daily with breakfast.   Yes [provider]  fluticasone (FLONASE) 50 MCG/ACT nasal spray Place 2 sprays into both nostrils daily.    Yes [provider]  guaiFENesin (ROBITUSSIN) 100 MG/5ML liquid Take 200 mg by mouth every 6 (six) hours as needed for cough.   Yes [provider]  guaiFENesin-dextromethorphan (ROBITUSSIN DM) 100-10 MG/5ML syrup Take 5 mLs by mouth every 4 (four) hours as needed for cough. 03/04/17  Yes Osvaldo ShipperKrishnan, Gokul, MD  ibuprofen (ADVIL) 200 MG tablet Take 200 mg by mouth every 6 (six) hours as needed for mild pain.   Yes [provider]  INCRUSE ELLIPTA 62.5 MCG/INH AEPB Inhale 1 puff into the lungs daily.  04/17/19  Yes [provider]  ipratropium-albuterol (DUONEB) 0.5-2.5 (3) MG/3ML SOLN Take 3 mLs by nebulization every 6 (six) hours as needed. Patient taking differently: Take 3 mLs by nebulization 4 (four) times daily.  04/21/18  Yes Berton Mountgbata, Sylvester I, MD  lamoTRIgine (LAMICTAL) 200 MG tablet Take 200 mg by mouth 2 (two) times daily.   Yes [provider]  lidocaine (ASPERCREME W/LIDOCAINE) 4 % cream Apply 1 application topically 3 (three) times daily as needed (for pain).    Yes [provider]  LORazepam (ATIVAN) 1 MG tablet Take 1 tablet (1 mg total) by mouth every 12 (twelve) hours. Patient taking differently: Take 1 mg by mouth 2 (two) times daily.  03/04/17  Yes Osvaldo ShipperKrishnan, Gokul, MD  losartan (COZAAR) 100 MG tablet Take 100 mg by mouth daily.   Yes [provider]  magnesium hydroxide (MILK OF MAGNESIA) 400 MG/5ML suspension Take 30 mLs by mouth at bedtime as needed for mild constipation.   Yes [provider]  magnesium oxide (MAG-OX) 400  (241.3 Mg) MG tablet Take 400 mg by mouth daily.   Yes [provider]  miconazole (MICOTIN) 2 % cream Apply 1 application topically daily.   Yes [provider]  montelukast (SINGULAIR) 10 MG tablet Take 10 mg by mouth at bedtime.   Yes [provider]  nystatin (NYSTATIN) powder Apply 1 Bottle topically 2 (two) times daily as needed (itchy rash).   Yes [provider]  pantoprazole (PROTONIX) 20 MG tablet Take 20 mg by mouth daily. 04/17/19  Yes [provider]  polyethylene glycol (MIRALAX / GLYCOLAX) 17 g packet Take 17 g by mouth daily as needed for mild constipation.   Yes [provider]  potassium chloride SA (KLOR-CON) 20 MEQ tablet Take 20 mEq by mouth daily. 03/15/19  Yes [provider]  QUEtiapine (SEROQUEL) 50 MG  tablet Take 125-150 mg by mouth 2 (two) times daily. Take 2.5 tablets (125 mg) in the morning and Take 3 tablets (150 mg) in the evening   Yes [provider]  senna-docusate (SENOKOT-S) 8.6-50 MG per tablet Take 1 tablet by mouth 2 (two) times daily.   Yes [provider]  torsemide (DEMADEX) 100 MG tablet Take 100 mg by mouth daily. 04/05/19  Yes [provider]  furosemide (LASIX) 40 MG tablet Take 1 tablet (40 mg total) by mouth daily. Patient not taking: Reported on 04/28/2019 11/12/17   Veryl Speak, MD  predniSONE (DELTASONE) 10 MG tablet Prednisone 60 mg po once daily for 3 days, then 40 mg po once daily for 3 days, then 30 mg po once daily for 3 days, then 20 mg po once daily for 3 days and then 10 mg po once daily for 3 days and stop. Patient not taking: Reported on 04/28/2019 04/21/18   Dana Allan I, MD  tiotropium (SPIRIVA HANDIHALER) 18 MCG inhalation capsule Place 1 capsule (18 mcg total) into inhaler and inhale daily. 04/21/18 04/21/19  Dana Allan I, MD  valsartan (DIOVAN) 320 MG tablet Take 1 tablet (320 mg total) by mouth daily. Patient not taking: Reported on  04/28/2019 04/16/13   Tanda Rockers, MD    Family History Family History  Problem Relation Age of Onset  . Emphysema Mother   . Cancer Other     Social History Social History   Tobacco Use  . Smoking status: Former Smoker    Packs/day: 0.50    Years: 2.00    Pack years: 1.00    Types: Cigarettes    Quit date: 05/28/1959    Years since quitting: 59.9  . Smokeless tobacco: Former Systems developer    Types: Snuff    Quit date: 05/28/1959  . Tobacco comment: Pt used snuff for 33 years  Substance Use Topics  . Alcohol use: No  . Drug use: No     Allergies   Aminoglycosides, Aspirin, Codeine, Ivp dye [iodinated diagnostic agents], Morphine and related, Neomycin, Penicillins, Phenothiazines, Promethazine, Sulfa antibiotics, and Tetracyclines & related   Review of Systems Review of Systems  Constitutional: Negative for chills and fever.  HENT: Negative for congestion and rhinorrhea.   Eyes: Negative for redness and visual disturbance.  Respiratory: Negative for shortness of breath and wheezing.   Cardiovascular: Negative for chest pain and palpitations.  Gastrointestinal: Positive for blood in stool and hematochezia. Negative for abdominal pain, nausea and vomiting.  Genitourinary: Negative for dysuria and urgency.  Musculoskeletal: Negative for arthralgias and myalgias.  Skin: Negative for pallor and wound.  Neurological: Negative for dizziness and headaches.     Physical Exam Updated Vital Signs BP (!) 149/59   Pulse 87   Temp 98 F (36.7 C)   Resp 18   SpO2 98%   Physical Exam Vitals signs and nursing note reviewed.  Constitutional:      General: She is not in acute distress.    Appearance: She is well-developed. She is not diaphoretic.  HENT:     Head: Normocephalic and atraumatic.  Eyes:     Pupils: Pupils are equal, round, and reactive to light.  Neck:     Musculoskeletal: Normal range of motion and neck supple.  Cardiovascular:     Rate and Rhythm: Normal rate  and regular rhythm.     Heart sounds: No murmur. No friction rub. No gallop.   Pulmonary:     Effort: Pulmonary  effort is normal.     Breath sounds: No wheezing or rales.  Abdominal:     General: There is no distension.     Palpations: Abdomen is soft.     Tenderness: There is no abdominal tenderness.  Genitourinary:    Comments: 2 small hemorrhoids nonbleeding.  Bright red blood on rectal exam Musculoskeletal:        General: No tenderness.  Skin:    General: Skin is warm and dry.  Neurological:     Mental Status: She is alert and oriented to person, place, and time.  Psychiatric:        Behavior: Behavior normal.      ED Treatments / Results  Labs (all labs ordered are listed, but only abnormal results are displayed) Labs Reviewed  BASIC METABOLIC PANEL - Abnormal; Notable for the following components:      Result Value   Glucose, Bld 111 (*)    Creatinine, Ser 1.06 (*)    GFR calc non Af Amer 50 (*)    GFR calc Af Amer 58 (*)    All other components within normal limits  CBC WITH DIFFERENTIAL/PLATELET - Abnormal; Notable for the following components:   RBC 3.72 (*)    Hemoglobin 11.0 (*)    HCT 34.4 (*)    All other components within normal limits  SARS CORONAVIRUS 2 (TAT 6-24 HRS)  CBC WITH DIFFERENTIAL/PLATELET  TYPE AND SCREEN  ABO/RH    EKG None  Radiology No results found.  Procedures Procedures (including critical care time)  Medications Ordered in ED Medications  sodium chloride 0.9 % bolus 1,000 mL (1,000 mLs Intravenous New Bag/Given (Non-Interop) 04/28/19 2010)     Initial Impression / Assessment and Plan / ED Course  I have reviewed the triage vital signs and the nursing notes.  Pertinent labs & imaging results that were available during my care of the patient were reviewed by me and considered in my medical decision making (see chart for details).        78 yo F with a chief complaints of bright red blood per rectum.  Patient had a  few copious bloody bowel movements today.  Blood pressure is stable not tachycardic.  Will obtain a laboratory evaluation discussed with GI and likely admit.  Discussed with Wendall Papa, GI. Recommended clear liquid diet. Will see in the morning.  The patients results and plan were reviewed and discussed.   Any x-rays performed were independently reviewed by myself.   Differential diagnosis were considered with the presenting HPI.  Medications  sodium chloride 0.9 % bolus 1,000 mL (1,000 mLs Intravenous New Bag/Given (Non-Interop) 04/28/19 2010)    Vitals:   04/28/19 1730 04/28/19 2002  BP: (!) 140/59 (!) 149/59  Pulse: 87 87  Resp: 19 18  Temp: 98 F (36.7 C)   SpO2: 97% 98%    Final diagnoses:  BRBPR (bright red blood per rectum)    Admission/ observation were discussed with the admitting physician, patient and/or family and they are comfortable with the plan.    Final Clinical Impressions(s) / ED Diagnoses   Final diagnoses:  BRBPR (bright red blood per rectum)    ED Discharge Orders    None       Melene Plan, DO 04/28/19 2231

## 2019-04-29 ENCOUNTER — Encounter (HOSPITAL_COMMUNITY): Payer: Self-pay

## 2019-04-29 ENCOUNTER — Other Ambulatory Visit: Payer: Self-pay

## 2019-04-29 DIAGNOSIS — K625 Hemorrhage of anus and rectum: Secondary | ICD-10-CM

## 2019-04-29 DIAGNOSIS — K5900 Constipation, unspecified: Secondary | ICD-10-CM

## 2019-04-29 DIAGNOSIS — J449 Chronic obstructive pulmonary disease, unspecified: Secondary | ICD-10-CM

## 2019-04-29 DIAGNOSIS — K922 Gastrointestinal hemorrhage, unspecified: Secondary | ICD-10-CM

## 2019-04-29 DIAGNOSIS — I5032 Chronic diastolic (congestive) heart failure: Secondary | ICD-10-CM

## 2019-04-29 DIAGNOSIS — K649 Unspecified hemorrhoids: Secondary | ICD-10-CM

## 2019-04-29 DIAGNOSIS — F319 Bipolar disorder, unspecified: Secondary | ICD-10-CM

## 2019-04-29 DIAGNOSIS — I10 Essential (primary) hypertension: Secondary | ICD-10-CM

## 2019-04-29 LAB — COMPREHENSIVE METABOLIC PANEL
ALT: 20 U/L (ref 0–44)
AST: 23 U/L (ref 15–41)
Albumin: 4 g/dL (ref 3.5–5.0)
Alkaline Phosphatase: 131 U/L — ABNORMAL HIGH (ref 38–126)
Anion gap: 12 (ref 5–15)
BUN: 18 mg/dL (ref 8–23)
CO2: 22 mmol/L (ref 22–32)
Calcium: 8.8 mg/dL — ABNORMAL LOW (ref 8.9–10.3)
Chloride: 101 mmol/L (ref 98–111)
Creatinine, Ser: 1.01 mg/dL — ABNORMAL HIGH (ref 0.44–1.00)
GFR calc Af Amer: >60 mL/min (ref >60)
GFR calc non Af Amer: 53 mL/min — ABNORMAL LOW (ref >60)
Glucose, Bld: 135 mg/dL — ABNORMAL HIGH (ref 70–99)
Potassium: 3.9 mmol/L (ref 3.5–5.1)
Sodium: 135 mmol/L (ref 135–145)
Total Bilirubin: 0.8 mg/dL (ref 0.3–1.2)
Total Protein: 7.3 g/dL (ref 6.5–8.1)

## 2019-04-29 LAB — IRON AND TIBC
Iron: 40 ug/dL (ref 28–170)
Saturation Ratios: 12 % (ref 10.4–31.8)
TIBC: 333 ug/dL (ref 250–450)
UIBC: 293 ug/dL

## 2019-04-29 LAB — CBC
HCT: 34.5 % — ABNORMAL LOW (ref 36.0–46.0)
Hemoglobin: 11 g/dL — ABNORMAL LOW (ref 12.0–15.0)
MCH: 29.6 pg (ref 26.0–34.0)
MCHC: 31.9 g/dL (ref 30.0–36.0)
MCV: 92.7 fL (ref 80.0–100.0)
Platelets: 232 10*3/uL (ref 150–400)
RBC: 3.72 MIL/uL — ABNORMAL LOW (ref 3.87–5.11)
RDW: 14.6 % (ref 11.5–15.5)
WBC: 7.6 10*3/uL (ref 4.0–10.5)
nRBC: 0 % (ref 0.0–0.2)

## 2019-04-29 LAB — FERRITIN: Ferritin: 74 ng/mL (ref 11–307)

## 2019-04-29 LAB — SARS CORONAVIRUS 2 (TAT 6-24 HRS): SARS Coronavirus 2: NEGATIVE

## 2019-04-29 LAB — PROTIME-INR
INR: 1 (ref 0.8–1.2)
Prothrombin Time: 12.9 seconds (ref 11.4–15.2)

## 2019-04-29 LAB — TRANSFERRIN: Transferrin: 238 mg/dL (ref 192–382)

## 2019-04-29 MED ORDER — PNEUMOCOCCAL VAC POLYVALENT 25 MCG/0.5ML IJ INJ
0.5000 mL | INJECTION | INTRAMUSCULAR | Status: AC
  Administered 2019-04-29: 0.5 mL via INTRAMUSCULAR
  Filled 2019-04-29: qty 0.5

## 2019-04-29 MED ORDER — HYDROCORTISONE ACETATE 25 MG RE SUPP
25.0000 mg | Freq: Two times a day (BID) | RECTAL | Status: DC
  Administered 2019-04-29 – 2019-04-30 (×3): 25 mg via RECTAL
  Filled 2019-04-29 (×4): qty 1

## 2019-04-29 MED ORDER — FLEET ENEMA 7-19 GM/118ML RE ENEM
1.0000 | ENEMA | Freq: Once | RECTAL | Status: AC
  Administered 2019-04-29: 1 via RECTAL
  Filled 2019-04-29: qty 1

## 2019-04-29 MED ORDER — SODIUM CHLORIDE 0.9 % IV SOLN
INTRAVENOUS | Status: DC
  Administered 2019-04-29: 17:00:00 via INTRAVENOUS

## 2019-04-29 MED ORDER — POLYETHYLENE GLYCOL 3350 17 G PO PACK
17.0000 g | PACK | Freq: Two times a day (BID) | ORAL | Status: DC
  Administered 2019-04-29 – 2019-04-30 (×2): 17 g via ORAL
  Filled 2019-04-29 (×2): qty 1

## 2019-04-29 MED ORDER — DM-GUAIFENESIN ER 30-600 MG PO TB12
1.0000 | ORAL_TABLET | Freq: Two times a day (BID) | ORAL | Status: DC | PRN
  Administered 2019-04-30: 1 via ORAL
  Filled 2019-04-29: qty 1

## 2019-04-29 MED ORDER — SALINE SPRAY 0.65 % NA SOLN
1.0000 | NASAL | Status: DC | PRN
  Administered 2019-04-30: 1 via NASAL
  Filled 2019-04-29: qty 44

## 2019-04-29 NOTE — H&P (Signed)
TRH H&P   Patient Demographics:    Cindy Padilla, is a 78 y.o. female  MRN: [id number]   DOB - [birth year]  Admit Date - 04/28/2019  Outpatient Primary MD for the patient is Ron ParkerBowen, Samuel, MD  Outpatient Specialists:GI    Patient coming from: SNF  Chief Complaint  Patient presents with  . Rectal Bleeding     HPI:    Cindy KindleChristine Padilla  is a 78 y.o. female, with COPD, dementia, obesity, NIDDM, HTN, IBS, hemorrhoids presents to the emergency room with rectal bleeding.  She reports she has been constipated, she took MiraLAX yesterday without relief.  She tried to disimpact herself, reports palpating a fecalith.  Later on she had another bowel movement that was loose, which had blood streaks.  She denies any hematemesis, melena, abdominal pain.  No abdominal trauma.  Denies any excessive NSAID use.  She had a flex sigmoidoscopy by Dr. Opal SidlesSweeney [facility] [place] 05[place], that revealed internal hemorrhoids.   Review of systems:  Review of Systems:  Constitutional: negative for anorexia, chills, fatigue or fevers HEENT: negative for earaches, epistaxis, or sore throat Respiratory: negative for dyspnea, cough, or wheezing Cardiovascular: negative for chest pain, palpitations, or syncope GU: negative for dysuria, urinary frequency, urinary urgency, hematuria Gastrointestinal: see HPI Musculoskeletal: negative for arthralgias, back pain or myalgias Neurological: negative for dizziness, headaches or weakness Behavioral/Psych: negative for suicidal or homicidal ideation Skin:negative for rash Heme: negative for bruises Endo: negative for hair loss, weight gain/loss   With Past History of the following :   Past Medical History:  Diagnosis Date  . Arthritis   . Bipolar 1 disorder (HCC)   . Bronchitis   . Chronic back pain   . Constipation   . COPD (chronic obstructive pulmonary disease) (HCC)   . Dementia (HCC)   . Diabetes mellitus   . GERD (gastroesophageal reflux disease)   . Hiatal hernia   . Hypertension   . IBS (irritable bowel syndrome)       Past Surgical History:  Procedure Laterality Date  . gallbladder    . MANDIBLE SURGERY    . PARTIAL HYSTERECTOMY       Social History:     Social History   Tobacco Use  . Smoking status: Former Smoker    Packs/day: 0.50  Years: 2.00    Pack years: 1.00    Types: Cigarettes    Quit date: 05/28/1959    Years since quitting: 59.9  . Smokeless tobacco: Former Neurosurgeon    Types: Snuff    Quit date: 05/28/1959  . Tobacco comment: Pt used snuff for 33 years  Substance Use Topics  . Alcohol use: No    Family History :    Family History  Problem Relation Age of Onset  . Emphysema Mother   . Cancer Other      Home Medications:   Prior to Admission medications   Medication Sig Start Date End Date Taking? Authorizing Provider  acetaminophen (TYLENOL) 325 MG tablet Take 650 mg by mouth 3 (three) times daily.   Yes [provider]  albuterol (PROVENTIL HFA;VENTOLIN HFA) 108 (90 BASE) MCG/ACT inhaler Inhale 2 puffs into the lungs every 4 (four) hours as needed for wheezing or shortness of breath.   Yes [provider]  alum & mag hydroxide-simeth (GERI-LANTA) 200-200-20 MG/5ML suspension Take 30 mLs by mouth 4 (four) times daily as needed for indigestion or heartburn.    Yes [provider]  amLODipine (NORVASC) 5 MG tablet Take 5 mg by mouth 2 (two) times daily.    Yes [provider]  atorvastatin (LIPITOR) 40 MG tablet Take 40 mg by mouth daily.    Yes [provider]  bisacodyl (DULCOLAX) 10 MG suppository Place 10 mg rectally 3 (three) times  daily as needed for moderate constipation.   Yes [provider]  budesonide-formoterol (SYMBICORT) 160-4.5 MCG/ACT inhaler Inhale 2 puffs into the lungs 2 (two) times daily.    Yes [provider]  cholecalciferol (VITAMIN D) 1000 UNITS tablet Take 1,000 Units by mouth daily.    Yes [provider]  clopidogrel (PLAVIX) 75 MG tablet Take 75 mg by mouth daily.    Yes [provider]  dicyclomine (BENTYL) 10 MG capsule Take 10 mg by mouth 4 (four) times daily.   Yes [provider]  donepezil (ARICEPT) 10 MG tablet Take 10 mg by mouth at bedtime.   Yes [provider]  famotidine (PEPCID) 20 MG tablet Take 20 mg by mouth 2 (two) times daily.   Yes [provider]  ferrous sulfate 325 (65 FE) MG tablet Take 325 mg by mouth daily with breakfast.   Yes [provider]  fluticasone (FLONASE) 50 MCG/ACT nasal spray Place 2 sprays into both nostrils daily.    Yes [provider]  guaiFENesin (ROBITUSSIN) 100 MG/5ML liquid Take 200 mg by mouth every 6 (six) hours as needed for cough.   Yes [provider]  guaiFENesin-dextromethorphan (ROBITUSSIN DM) 100-10 MG/5ML syrup Take 5 mLs by mouth every 4 (four) hours as needed for cough. 03/04/17  Yes [name], MD  ibuprofen (ADVIL) 200 MG tablet Take 200 mg by mouth every 6 (six) hours as needed for mild pain.   Yes [provider]  INCRUSE ELLIPTA 62.5 MCG/INH AEPB Inhale 1 puff into the lungs daily.  04/17/19  Yes [provider]  ipratropium-albuterol (DUONEB) 0.5-2.5 (3) MG/3ML SOLN Take 3 mLs by nebulization every 6 (six) hours as needed. Patient taking differently: Take 3 mLs by nebulization 4 (four) times daily.  04/21/18  Yes Berton Mount I, MD  lamoTRIgine (LAMICTAL) 200 MG tablet Take 200 mg by mouth 2 (two) times daily.   Yes [provider]  lidocaine (ASPERCREME W/LIDOCAINE) 4 % cream Apply 1 application topically 3 (three)  times daily as needed (for pain).    Yes [provider]  LORazepam (ATIVAN) 1 MG tablet Take 1 tablet (1 mg total) by mouth every 12 (twelve) hours. Patient taking differently: Take 1 mg by mouth 2 (two) times daily.  03/04/17  Yes Bonnielee Haff, MD  losartan (COZAAR) 100 MG tablet Take 100 mg by mouth daily.   Yes [provider]  magnesium hydroxide (MILK OF MAGNESIA) 400 MG/5ML suspension Take 30 mLs by mouth at bedtime as needed for mild constipation.   Yes [provider]  magnesium oxide (MAG-OX) 400 (241.3 Mg) MG tablet Take 400 mg by mouth daily.   Yes [provider]  miconazole (MICOTIN) 2 % cream Apply 1 application topically daily.   Yes [provider]  montelukast (SINGULAIR) 10 MG tablet Take 10 mg by mouth at bedtime.   Yes [provider]  nystatin (NYSTATIN) powder Apply 1 Bottle topically 2 (two) times daily as needed (itchy rash).   Yes [provider]  pantoprazole (PROTONIX) 20 MG tablet Take 20 mg by mouth daily. 04/17/19  Yes [provider]  polyethylene glycol (MIRALAX / GLYCOLAX) 17 g packet Take 17 g by mouth daily as needed for mild constipation.   Yes [provider]  potassium chloride SA (KLOR-CON) 20 MEQ tablet Take 20 mEq by mouth daily. 03/15/19  Yes [provider]  QUEtiapine (SEROQUEL) 50 MG tablet Take 125-150 mg by mouth 2 (two) times daily. Take 2.5 tablets (125 mg) in the morning and Take 3 tablets (150 mg) in the evening   Yes [provider]  senna-docusate (SENOKOT-S) 8.6-50 MG per tablet Take 1 tablet by mouth 2 (two) times daily.   Yes [provider]  torsemide (DEMADEX) 100 MG tablet Take 100 mg by mouth daily. 04/05/19  Yes [provider]  tiotropium (SPIRIVA HANDIHALER) 18 MCG inhalation capsule Place 1 capsule (18 mcg total) into inhaler and inhale daily. 04/21/18 04/21/19  Bonnell Public, MD     Allergies:     Allergies   Allergen Reactions  . Aminoglycosides Other (See Comments)    Reaction:  Unknown   . Aspirin Other (See Comments)    Reaction:  Unknown   . Codeine Other (See Comments)    Reaction:  Unknown   . Ivp Dye [Iodinated Diagnostic Agents] Other (See Comments)    Reaction:  Unknown   . Morphine And Related Other (See Comments)    Reaction:  Unknown   . Neomycin Other (See Comments)    Reaction:  Unknown   . Penicillins Rash and Other (See Comments)    Has patient had a PCN reaction causing immediate rash, facial/tongue/throat swelling, SOB or lightheadedness with hypotension: No Has patient had a PCN reaction causing severe rash involving mucus membranes or skin necrosis: No Has patient had a PCN reaction that required hospitalization No Has patient had a PCN reaction occurring within the last 10 years: No If all of the above answers are "NO", then may proceed with Cephalosporin use.  Marland Kitchen Phenothiazines Other (See Comments)    Reaction:  Unknown   . Promethazine Other (See Comments)    Reaction:  Unknown   . Sulfa Antibiotics Other (See Comments)    Reaction:  Unknown   . Tetracyclines & Related Other (See Comments)    Reaction: Unknown      Physical Exam:   Vitals  Blood pressure (!) 148/68, pulse 79, temperature 97.9 F (36.6 C), temperature source Oral, resp.  rate 16, height  (1.6 m), weight 83.9 kg, SpO2 94 %.  Physical Exam   Constitutional - resting comfortably, no acute distress Eyes - pupils equal round and reactive to light and accomodation, extra ocular movements intact Nose - no gross deformity or drainage Mouth - no oral lesions noted Throat - no swelling or erythema Neck - supple, no JVD   CV - (+)S1S2, no murmurs  Resp - CTA bilaterally, no wheezing or crackles, GI -distended abdomen, ventral hernia, (+)BS, soft, diffuse tenderness to palpation, no guarding, no rebound tenderness Extrem - no clubbing, cyanosis, or peripheral edema  Skin - no rashes or wounds  Neuro - alert, aware, oriented to person/place/time  Psych - normal affect, no anxiety   Patient has Pressure Ulcer on Admission?: no   Data Review:    CBC Recent Labs  Lab 04/28/19 1938 04/29/19 0005  WBC 8.8 7.6  HGB 11.0* 11.0*  HCT 34.4* 34.5*  PLT 240 232  MCV 92.5 92.7  MCH 29.6 29.6  MCHC 32.0 31.9  RDW 14.5 14.6  LYMPHSABS 2.2  --   MONOABS 0.9  --   EOSABS 0.1  --   BASOSABS 0.0  --    ------------------------------------------------------------------------------------------------------------------  Chemistries  Recent Labs  Lab 04/28/19 1747 04/29/19 0005  NA 135 135  K 4.2 3.9  CL 98 101  CO2 24 22  GLUCOSE 111* 135*  BUN 20 18  CREATININE 1.06* 1.01*  CALCIUM 9.5 8.8*  AST  --  23  ALT  --  20  ALKPHOS  --  131*  BILITOT  --  0.8   ------------------------------------------------------------------------------------------------------------------ estimated creatinine clearance is 46.3 mL/min (A) (by C-G formula based on SCr of 1.01 mg/dL (H)). ------------------------------------------------------------------------------------------------------------------ No results for input(s): TSH, T4TOTAL, T3FREE, THYROIDAB in the last 72 hours.  Invalid input(s): FREET3  Coagulation profile Recent Labs  Lab 04/29/19 0005  INR 1.0   ------------------------------------------------------------------------------------------------------------------- No results for input(s): DDIMER in the last 72 hours. -------------------------------------------------------------------------------------------------------------------  Cardiac Enzymes No results for input(s): CKMB, TROPONINI, MYOGLOBIN in the last 168 hours.  Invalid input(s): CK ------------------------------------------------------------------------------------------------------------------    Component Value Date/Time   BNP 75.6 04/17/2018 1754      ---------------------------------------------------------------------------------------------------------------  Urinalysis    Component Value Date/Time   COLORURINE COLORLESS (A) 03/30/2017 1813   APPEARANCEUR CLEAR 03/30/2017 1813   LABSPEC 1.003 (L) 03/30/2017 1813   PHURINE 7.0 03/30/2017 1813   GLUCOSEU NEGATIVE 03/30/2017 1813   HGBUR NEGATIVE 03/30/2017 1813   BILIRUBINUR NEGATIVE 03/30/2017 1813   KETONESUR NEGATIVE 03/30/2017 1813   PROTEINUR NEGATIVE 03/30/2017 1813   UROBILINOGEN 0.2 09/19/2014 1633   NITRITE NEGATIVE 03/30/2017 1813   LEUKOCYTESUR NEGATIVE 03/30/2017 1813    ----------------------------------------------------------------------------------------------------------------   Imaging Results:    No results found.    Assessment & Plan:    Principal Problem:   GIB (gastrointestinal bleeding) Active Problems:   HTN (hypertension)   Chronic diastolic CHF (congestive heart failure) (HCC)   Dementia with behavioral disturbance (HCC)   Constipation   Morbid obesity (HCC)   Bipolar 1 disorder (HCC)   COPD with chronic bronchitis (HCC)    GIB/Constipation: Patient presents stool streaked with blood, hemorrhoidal vs diverticular bleeding.  Hemodynamically stable.  H&H at baseline, continue to monitor.  Gastroenterology to evaluate patient in the a.m.  Clear liquid diet.    HTN: Blood pressures above but close to goal.  Holding antihypertensive medications in case there is excessive bleeding    Chronic diastolic CHF: Clinically euvolemic.  Bipolar 1 disorder/Dementia with behavioral disturbance: Clinically stable.  Denies any visual or auditory hallucinations.  Continue Aricept, Lamictal, lorazepam, Seroquel      Morbid obesity:Obesity affects all facets of care.  Likely secondary to excessive caloric intake.  Encourage patient to reduce caloric intake while increasing physical activity and engage in other lifestyle changes that may result in weight  loss.     COPD with chronic bronchitis: Evidence of acute exacerbation.  Continue Incruse Ellipta, Spiriva as needed nebs  DVT Prophylaxis SCDs   AM Labs Ordered, also please review Full Orders  Family Communication: Admission, patients condition and plan of care including tests being ordered have been discussed with the patient  who indicate understanding and agree with the plan and Code Status.  Code Status full  Likely DC to SNF  Condition GUARDED    Consults called: Gastroenterology  Admission status: Admit inpatient  Time spent in minutes : 53   Coletta Memos M.D on 04/29/2019 at 4:34 AM  To page go to [email or web address] - password [facility]

## 2019-04-29 NOTE — Progress Notes (Signed)
PROGRESS NOTE    Cindy Padilla  ZOX:[id number] DOB: 05/09/1941 DOA: 04/28/2019 PCP: Ron ParkerBowen, Samuel, MD    Brief Narrative:   78 year old lady with prior h/o chronic diastolic CHF, COPD, bipolar disorder, dementia, hypertension,  Diabetes mellitus, IBS, hemorrhoids,was brought in from ALF memory care unit for rectal bleeding. She reports [facility] the [facility].  She denies any bloody bowel movements overnight. And her H&H has remained stable around 11.   Assessment & Plan:   Principal Problem:   GIB (gastrointestinal bleeding) Active Problems:   HTN (hypertension)   Chronic diastolic CHF (congestive heart failure) (HCC)   Dementia with behavioral disturbance (HCC)   Constipation   Morbid obesity (HCC)   Bipolar 1 disorder (HCC)   COPD with chronic bronchitis (HCC)   Chronic constipation Rectal bleeding:  Differential include hemorrhoidal vs diverticular bleed.  Pt denies using NSAIDS, GI consulted for further evaluation.  Her H&H remains stable around 11.  Clear liquid diet for now.     Hypertension:  Well controlled.    Chronic diastolic heart failure:  No echocardiogram on file.  She appears to be euvolemic.    H/O COPD with bronchitis:  She does not appear to be wheezing .  Resume home bronchodilators.    Morbid obesity.  Body mass index is 32.61 kg/m.  mild AKI;  Probably from dehydration. Baseline creatinine between 0.6 to 0.7.  Gentle hydration. Repeat renal parameters in am.    Bipolar disorder/ dementia:  Resume home meds.    DVT prophylaxis: SCD'S  Code Status: (FULL CODE.  Family Communication: none at bedside.  Disposition Plan: pending further evaluation by GI.    Consultants:   Gastroenterology with Adolph Pollackle bauer.   Procedures:none.   Antimicrobials: none.     Subjective: No bloody bowel movements this am.  No nausea or vomiting.   Objective: Vitals:   04/29/19 0351 04/29/19 0552  04/29/19 0822 04/29/19 1347  BP:  (!) 131/57  (!) 147/64  Pulse:  73  69  Resp:  19  16  Temp:  98.5 F (36.9 C)  98.1 F (36.7 C)  TempSrc:  Oral  Oral  SpO2:  96% 97% 97%  Weight: 83.9 kg 83.5 kg    Height: 5\' 3"  (1.6 m)       Intake/Output Summary (Last 24 hours) at 04/29/2019 1527 Last data filed at 04/29/2019 0900 Gross per 24 hour  Intake 1240 ml  Output 960 ml  Net 280 ml   Filed Weights   04/29/19 0351 04/29/19 0552  Weight: 83.9 kg 83.5 kg    Examination:  General exam: Appears calm and comfortable  Respiratory system: Clear to auscultation. Respiratory effort normal. Cardiovascular system: S1 & S2 heard, RRR. No JVD, No pedal edema. Gastrointestinal system: Abdomen is nondistended, soft and nontender. Normal bowel sounds heard. Central nervous system: Alert and oriented. No focal neurological deficits. Extremities: Symmetric 5 x 5 power. Skin: No rashes, lesions or ulcers Psychiatry:  Mood & affect appropriate.     Data Reviewed: I have personally reviewed following labs and imaging studies  CBC: Recent Labs  Lab 04/28/19 1938 04/29/19 0005  WBC 8.8 7.6  NEUTROABS 5.6  --   HGB 11.0* 11.0*  HCT 34.4* 34.5*  MCV 92.5 92.7  PLT 240 232   Basic Metabolic Panel: Recent Labs  Lab 04/28/19 1747 04/29/19 0005  NA 135 135  K 4.2 3.9  CL 98 101  CO2 24  22  GLUCOSE 111* 135*  BUN 20 18  CREATININE 1.06* 1.01*  CALCIUM 9.5 8.8*   GFR: Estimated Creatinine Clearance: 46.2 mL/min (A) (by C-G formula based on SCr of 1.01 mg/dL (H)). Liver Function Tests: Recent Labs  Lab 04/29/19 0005  AST 23  ALT 20  ALKPHOS 131*  BILITOT 0.8  PROT 7.3  ALBUMIN 4.0   No results for input(s): LIPASE, AMYLASE in the last 168 hours. No results for input(s): AMMONIA in the last 168 hours. Coagulation Profile: Recent Labs  Lab 04/29/19 0005  INR 1.0   Cardiac Enzymes: No results for input(s): CKTOTAL, CKMB, CKMBINDEX, TROPONINI in the last 168 hours. BNP  (last 3 results) No results for input(s): PROBNP in the last 8760 hours. HbA1C: No results for input(s): HGBA1C in the last 72 hours. CBG: No results for input(s): GLUCAP in the last 168 hours. Lipid Profile: No results for input(s): CHOL, HDL, LDLCALC, TRIG, CHOLHDL, LDLDIRECT in the last 72 hours. Thyroid Function Tests: No results for input(s): TSH, T4TOTAL, FREET4, T3FREE, THYROIDAB in the last 72 hours. Anemia Panel: Recent Labs    04/29/19 0544  FERRITIN 74  TIBC 333  IRON 40   Sepsis Labs: No results for input(s): PROCALCITON, LATICACIDVEN in the last 168 hours.  Recent Results (from the past 240 hour(s))  SARS CORONAVIRUS 2 (TAT 6-24 HRS) Nasopharyngeal Nasopharyngeal Swab     Status: None   Collection Time: 04/28/19 10:25 PM   Specimen: Nasopharyngeal Swab  Result Value Ref Range Status   SARS Coronavirus 2 NEGATIVE NEGATIVE Final    Comment: (NOTE) SARS-CoV-2 target nucleic acids are NOT DETECTED. The SARS-CoV-2 RNA is generally detectable in upper and lower respiratory specimens during the acute phase of infection. Negative results do not preclude SARS-CoV-2 infection, do not rule out co-infections with other pathogens, and should not be used as the sole basis for treatment or other patient management decisions. Negative results must be combined with clinical observations, patient history, and epidemiological information. The expected result is Negative. Fact Sheet for Patients: HairSlick.no Fact Sheet for Healthcare Providers: quierodirigir.com This test is not yet approved or cleared by the [place] FDA and  has been authorized for detection and/or diagnosis of SARS-CoV-2 by FDA under an Emergency Use Authorization (EUA). This EUA will remain  in effect (meaning this test can be used) for the duration of the COVID-19 declaration under Section 56 4(b)(1) of the Act, 21 U.S.C. section 360bbb-3(b)(1),  unless the authorization is terminated or revoked sooner. Performed at [facility], [address], [place], [place] [address]          Radiology Studies: No results found.      Scheduled Meds: . dicyclomine  10 mg Oral TID AC & HS  . docusate sodium  100 mg Oral Daily  . donepezil  10 mg Oral QHS  . famotidine  20 mg Oral BID  . fluticasone furoate-vilanterol  1 puff Inhalation Daily  . hydrocortisone  25 mg Rectal BID  . ipratropium-albuterol  3 mL Nebulization QID  . lamoTRIgine  200 mg Oral BID  . LORazepam  1 mg Oral BID  . montelukast  10 mg Oral QHS  . pantoprazole  20 mg Oral Daily  . QUEtiapine  125 mg Oral Daily  . QUEtiapine  150 mg Oral QHS  . umeclidinium bromide  1 puff Inhalation Daily   Continuous Infusions:   LOS: 1 day       Kathlen Mody, MD [facility] 04/29/2019, 3:27 PM

## 2019-04-29 NOTE — TOC Initial Note (Signed)
Transition of Care [facility]) - Initial/Assessment Note    Patient Details  Name: Cindy Padilla MRN: [id number] Date of Birth: November 02, 1940  Transition [facility]) CM/SW Contact:    Cindy Mage, LCSW Phone Number: 04/29/2019, 12:20 PM    Clinical Narrative:   Cindy Padilla is here from [facility], where she has been a resident for 8 years or so.  She ambulates with a walker, is able to do ADL's, and is planning on returning there at d/c.  Will need FL2 done if here longer than 48 hours.  Facility states they are unable to transport. TOC will continue to follow during the course of hospitalization.                     Expected Discharge Plan: Assisted Living Barriers to Discharge: No Barriers Identified   Patient Goals and CMS Choice        Expected Discharge Plan and Services Expected Discharge Plan: Assisted Living   Discharge Planning Services: CM Consult   Living arrangements for the past 2 months: Assisted Living Facility                                      Prior Living Arrangements/Services Living arrangements for the past 2 months: [place] Lives with:: Facility Resident Patient language and need for interpreter reviewed:: Yes Do you feel safe going back to the place where you live?: Yes      Need for Family Participation in Patient Care: No (Comment) Care giver support system in place?: Yes (comment) Current home services: DME Criminal Activity/Legal Involvement Pertinent to Current Situation/Hospitalization: No - Comment as needed  Activities of Daily Living Home Assistive Devices/Equipment: Walker (specify type)(front wheel walker ) ADL Screening (condition at time of admission) Patient's cognitive ability adequate to safely complete daily activities?: Yes Is the patient deaf or have difficulty hearing?: No Does the patient have difficulty seeing, even when wearing glasses/contacts?: No Does the patient have difficulty  concentrating, remembering, or making decisions?: No Patient able to express need for assistance with ADLs?: Yes Does the patient have difficulty dressing or bathing?: No Independently performs ADLs?: Yes (appropriate for developmental age) Does the patient have difficulty walking or climbing stairs?: Yes Weakness of Legs: Both Weakness of Arms/Hands: None  Permission Sought/Granted Permission sought to share information with : Family Supports Permission granted to share information with : Yes, Verbal Permission Granted  Share Information with NAME: Cindy Padilla     Permission granted to share info w Relationship: daughter  Permission granted to share info w Contact Information: [phone]  Emotional Assessment Appearance:: Appears stated age Attitude/Demeanor/Rapport: Engaged Affect (typically observed): Appropriate Orientation: : Oriented to Padilla, Oriented to Place, Oriented to Situation Alcohol / Substance Use: Not Applicable Psych Involvement: No (comment)  Admission diagnosis:  BRBPR (bright red blood per rectum) [K62.5] Patient Active Problem List   Diagnosis Date Noted  . COPD with chronic bronchitis ([place]) 04/29/2019  . GIB (gastrointestinal bleeding) 04/28/2019  . Acute exacerbation of chronic obstructive pulmonary disease (COPD) ([place]) 02/26/2017  . COPD with acute exacerbation ([place]) 02/24/2017  . Bipolar 1 disorder ([place]) 02/24/2017  . Normocytic anemia 02/24/2017  . Morbid obesity ([place]) 04/14/2015  . Upper airway cough syndrome 04/14/2015  . Ventral hernia 04/24/2014  . Constipation 04/24/2014  . Hemorrhoids 04/22/2014  . Abnormality of gait 09/30/2013  . Dyskinesia, drug-induced  09/30/2013  . 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 Chronic diastolic CHF (congestive heart failure) (HCC) 07/27/2013  . Dementia with behavioral disturbance (HCC) 07/27/2013  . Weakness 02/09/2013  . Hyponatremia 02/09/2013  . Shortness of breath 02/09/2013  . HTN (hypertension) 02/09/2013  .  Other and unspecified hyperlipidemia 02/09/2013   PCP:  Ron Parker, MD Pharmacy:  No Pharmacies Listed    Social Determinants of Health (SDOH) Interventions    Readmission Risk Interventions No flowsheet data found.

## 2019-04-29 NOTE — Consult Note (Signed)
Referring Provider:  Dr. Karleen Hampshire, [facility] Primary Care Physician:  Sande Brothers, MD Primary Gastroenterologist:  Dr. Christoper Fabian in [place]  Reason for Consultation:  Hematochezia   HPI: Cindy Padilla is a 78 y.o. female with past medical history of COPD, some mild dementia, type 2 diabetes mellitus, hypertension, bipolar 1.  She is also on Plavix although I am not sure what the diagnosis is that requires this.  Anyway, she was admitted to [facility] long hospital with hematochezia.  She reports that this bleeding is from hemorrhoids and she has had them for quite some time.  She says that she gets constipated and has really hard stools, which aggravates them.  On this particular occasion, however, she had bright red blood dripping down her leg so she was brought to the emergency department.  Hemoglobin chronically runs low, but is completely stable at 11 g on this admission.  Iron studies are normal.  She does take ferrous sulfate 325 mg daily.  She believes her high starch-containing diet and her medications cause her constipation.  She says she takes a stool softener at the facility, but she is unaware of any other regular regimen.  She follows with Dr. Christoper Fabian for gastroenterology in [place].  I reviewed some of the records in care everywhere.  Had a flexible sigmoidoscopy with Dr. Christoper Fabian in January 2016 for complaints of rectal bleeding showed moderately severe external hemorrhoids, but was otherwise normal.  Last colonoscopy was in 2009 it was normal aside from hemorrhoids.  She apparently also had a flexible sigmoidoscopy in 2014 that revealed moderate external hemorrhoids with anal ulceration and skin tags.  She has had recurrent issues of rectal bleeding.  She received an enema today and had to brown bowel movements with just some old appearing blood according to the nursing staff.  Was complaining of being very sore on her bottom so hydrocortisone suppositories had already been  ordered.   Past Medical History:  Diagnosis Date  . Arthritis   . Bipolar 1 disorder ([place])   . Bronchitis   . Chronic back pain   . Constipation   . COPD (chronic obstructive pulmonary disease) ([place])   . Dementia ([place])   . Diabetes mellitus   . GERD (gastroesophageal reflux disease)   . Hiatal hernia   . Hypertension   . IBS (irritable bowel syndrome)     Past Surgical History:  Procedure Laterality Date  . gallbladder    . MANDIBLE SURGERY    . PARTIAL HYSTERECTOMY      Prior to Admission medications   Medication Sig Start Date End Date Taking? Authorizing Provider  acetaminophen (TYLENOL) 325 MG tablet Take 650 mg by mouth 3 (three) times daily.   Yes [provider]  albuterol (PROVENTIL HFA;VENTOLIN HFA) 108 (90 BASE) MCG/ACT inhaler Inhale 2 puffs into the lungs every 4 (four) hours as needed for wheezing or shortness of breath.   Yes [provider]  alum & mag hydroxide-simeth (GERI-LANTA) 200-200-20 MG/5ML suspension Take 30 mLs by mouth 4 (four) times daily as needed for indigestion or heartburn.    Yes [provider]  amLODipine (NORVASC) 5 MG tablet Take 5 mg by mouth 2 (two) times daily.    Yes [provider]  atorvastatin (LIPITOR) 40 MG tablet Take 40 mg by mouth daily.    Yes [provider]  bisacodyl (DULCOLAX) 10 MG suppository Place 10 mg rectally 3 (three) times daily as needed for moderate constipation.   Yes [provider]  budesonide-formoterol (SYMBICORT) 160-4.5 MCG/ACT inhaler Inhale 2 puffs into the lungs 2 (two) times daily.    Yes [provider]  cholecalciferol (VITAMIN D) 1000 UNITS tablet Take 1,000 Units by mouth daily.    Yes [provider]  clopidogrel (PLAVIX) 75 MG tablet Take 75 mg by mouth daily.    Yes [provider]  dicyclomine (BENTYL) 10 MG capsule Take 10 mg by mouth 4 (four) times daily.   Yes [provider]  donepezil (ARICEPT) 10 MG  tablet Take 10 mg by mouth at bedtime.   Yes [provider]  famotidine (PEPCID) 20 MG tablet Take 20 mg by mouth 2 (two) times daily.   Yes [provider]  ferrous sulfate 325 (65 FE) MG tablet Take 325 mg by mouth daily with breakfast.   Yes [provider]  fluticasone (FLONASE) 50 MCG/ACT nasal spray Place 2 sprays into both nostrils daily.    Yes [provider]  guaiFENesin (ROBITUSSIN) 100 MG/5ML liquid Take 200 mg by mouth every 6 (six) hours as needed for cough.   Yes [provider]  guaiFENesin-dextromethorphan (ROBITUSSIN DM) 100-10 MG/5ML syrup Take 5 mLs by mouth every 4 (four) hours as needed for cough. 03/04/17  Yes Osvaldo ShipperKrishnan, Gokul, MD  ibuprofen (ADVIL) 200 MG tablet Take 200 mg by mouth every 6 (six) hours as needed for mild pain.   Yes [provider]  INCRUSE ELLIPTA 62.5 MCG/INH AEPB Inhale 1 puff into the lungs daily.  04/17/19  Yes [provider]  ipratropium-albuterol (DUONEB) 0.5-2.5 (3) MG/3ML SOLN Take 3 mLs by nebulization every 6 (six) hours as needed. Patient taking differently: Take 3 mLs by nebulization 4 (four) times daily.  04/21/18  Yes Berton Mountgbata, Sylvester I, MD  lamoTRIgine (LAMICTAL) 200 MG tablet Take 200 mg by mouth 2 (two) times daily.   Yes [provider]  lidocaine (ASPERCREME W/LIDOCAINE) 4 % cream Apply 1 application topically 3 (three) times daily as needed (for pain).    Yes [provider]  LORazepam (ATIVAN) 1 MG tablet Take 1 tablet (1 mg total) by mouth every 12 (twelve) hours. Patient taking differently: Take 1 mg by mouth 2 (two) times daily.  03/04/17  Yes Osvaldo ShipperKrishnan, Gokul, MD  losartan (COZAAR) 100 MG tablet Take 100 mg by mouth daily.   Yes [provider]  magnesium hydroxide (MILK OF MAGNESIA) 400 MG/5ML suspension Take 30 mLs by mouth at bedtime as needed for mild constipation.   Yes [provider]  magnesium oxide (MAG-OX) 400 (241.3 Mg) MG  tablet Take 400 mg by mouth daily.   Yes [provider]  miconazole (MICOTIN) 2 % cream Apply 1 application topically daily.   Yes [provider]  montelukast (SINGULAIR) 10 MG tablet Take 10 mg by mouth at bedtime.   Yes [provider]  nystatin (NYSTATIN) powder Apply 1 Bottle topically 2 (two) times daily as needed (itchy rash).   Yes [provider]  pantoprazole (PROTONIX) 20 MG tablet Take 20 mg by mouth daily. 04/17/19  Yes [provider]  polyethylene glycol (MIRALAX / GLYCOLAX) 17 g packet Take 17 g by mouth daily as needed for mild constipation.   Yes [provider]  potassium chloride SA (KLOR-CON) 20 MEQ tablet Take 20 mEq by mouth daily. 03/15/19  Yes [provider]  QUEtiapine (SEROQUEL) 50 MG tablet Take 125-150 mg by mouth 2 (two) times daily. Take 2.5 tablets (125 mg) in the morning and  Take 3 tablets (150 mg) in the evening   Yes [provider]  senna-docusate (SENOKOT-S) 8.6-50 MG per tablet Take 1 tablet by mouth 2 (two) times daily.   Yes [provider]  torsemide (DEMADEX) 100 MG tablet Take 100 mg by mouth daily. 04/05/19  Yes [provider]  tiotropium (SPIRIVA HANDIHALER) 18 MCG inhalation capsule Place 1 capsule (18 mcg total) into inhaler and inhale daily. 04/21/18 04/21/19  Barnetta Chapel, MD    Current Facility-Administered Medications  Medication Dose Route Frequency Provider Last Rate Last Dose  . dicyclomine (BENTYL) capsule 10 mg  10 mg Oral TID AC & HS Coletta Memos, MD   10 mg at 04/29/19 1159  . docusate sodium (COLACE) capsule 100 mg  100 mg Oral Daily Coletta Memos, MD   100 mg at 04/29/19 1610  . donepezil (ARICEPT) tablet 10 mg  10 mg Oral QHS Coletta Memos, MD   10 mg at 04/28/19 2224  . famotidine (PEPCID) tablet 20 mg  20 mg Oral BID Coletta Memos, MD   20 mg at 04/29/19 9604  . fluticasone furoate-vilanterol (BREO ELLIPTA) 200-25 MCG/INH 1 puff  1 puff  Inhalation Daily Coletta Memos, MD   1 puff at 04/29/19 5409  . hydrocortisone (ANUSOL-HC) suppository 25 mg  25 mg Rectal BID Kathlen Mody, MD      . ipratropium-albuterol (DUONEB) 0.5-2.5 (3) MG/3ML nebulizer solution 3 mL  3 mL Nebulization QID Coletta Memos, MD   3 mL at 04/29/19 8119  . lamoTRIgine (LAMICTAL) tablet 200 mg  200 mg Oral BID Coletta Memos, MD   200 mg at 04/29/19 1478  . LORazepam (ATIVAN) tablet 1 mg  1 mg Oral BID Coletta Memos, MD   1 mg at 04/29/19 2956  . montelukast (SINGULAIR) tablet 10 mg  10 mg Oral QHS Coletta Memos, MD   10 mg at 04/29/19 0135  . pantoprazole (PROTONIX) EC tablet 20 mg  20 mg Oral Daily Coletta Memos, MD   20 mg at 04/29/19 0904  . polyethylene glycol (MIRALAX / GLYCOLAX) packet 17 g  17 g Oral Daily PRN Coletta Memos, MD      . QUEtiapine (SEROQUEL) tablet 125 mg  125 mg Oral Daily Coletta Memos, MD   125 mg at 04/29/19 0905  . QUEtiapine (SEROQUEL) tablet 150 mg  150 mg Oral QHS Coletta Memos, MD   150 mg at 04/28/19 2224  . umeclidinium bromide (INCRUSE ELLIPTA) 62.5 MCG/INH 1 puff  1 puff Inhalation Daily Coletta Memos, MD   1 puff at 04/29/19 2130    Allergies as of 04/28/2019 - Review Complete 04/28/2019  Allergen Reaction Noted  . Aminoglycosides Other (See Comments) 05/06/2014  . Aspirin Other (See Comments) 01/07/2011  . Codeine Other (See Comments) 01/07/2011  . Ivp dye [iodinated diagnostic agents] Other (See Comments) 01/07/2011  . Morphine and related Other (See Comments) 12/25/2010  . Neomycin Other (See Comments) 01/07/2011  . Penicillins Rash and Other (See Comments) 12/25/2010  . Phenothiazines Other (See Comments) 05/06/2014  . Promethazine Other (See Comments) 12/25/2010  . Sulfa antibiotics Other (See Comments) 12/25/2010  . Tetracyclines & related Other (See Comments) 12/25/2010    Family History  Problem Relation Age of Onset  . Emphysema Mother   . Cancer Other     Social History   Socioeconomic History  .  Marital status: Divorced    Spouse name: Not on file  . Number of children: 2  . Years of education: 7 th  .  Highest education level: Not on file  Occupational History  . Occupation: retired    Comment: retired  Engineer, production  . Financial resource strain: Not on file  . Food insecurity    Worry: Not on file    Inability: Not on file  . Transportation needs    Medical: Not on file    Non-medical: Not on file  Tobacco Use  . Smoking status: Former Smoker    Packs/day: 0.50    Years: 2.00    Pack years: 1.00    Types: Cigarettes    Quit date: 05/28/1959    Years since quitting: 59.9  . Smokeless tobacco: Former Neurosurgeon    Types: Snuff    Quit date: 05/28/1959  . Tobacco comment: Pt used snuff for 33 years  Substance and Sexual Activity  . Alcohol use: No  . Drug use: No  . Sexual activity: Never  Lifestyle  . Physical activity    Days per week: Not on file    Minutes per session: Not on file  . Stress: Not on file  Relationships  . Social Musician on phone: Not on file    Gets together: Not on file    Attends religious service: Not on file    Active member of club or organization: Not on file    Attends meetings of clubs or organizations: Not on file    Relationship status: Not on file  . Intimate partner violence    Fear of current or ex partner: Not on file    Emotionally abused: Not on file    Physically abused: Not on file    Forced sexual activity: Not on file  Other Topics Concern  . Not on file  Social History Narrative   Patient lives [facility].   Retired   Education 7 th grade   Right handed.   Caffeine one cup of coffee daily    Review of Systems: ROS is O/W negative except as mentioned in HPI.  Physical Exam: Vital signs in last 24 hours: Temp:  [97.9 F (36.6 C)-98.5 F (36.9 C)] 98.1 F (36.7 C) (12/03 1347) Pulse Rate:  [69-87] 69 (12/03 1347) Resp:  [16-19] 16 (12/03 1347) BP: (131-152)/(57-99) 147/64 (12/03 1347)  SpO2:  [94 %-98 %] 97 % (12/03 1347) Weight:  [83.5 kg-83.9 kg] 83.5 kg (12/03 0552) [name]: 04/29/19 General:  Alert, Well-developed, well-nourished, pleasant and cooperative in NAD Head:  Normocephalic and atraumatic. Eyes:  Sclera clear, no icterus.  Conjunctiva pink. Ears:  Normal auditory acuity. Mouth:  No deformity or lesions.   Lungs:  Clear throughout to auscultation.  No wheezes, crackles, or rhonchi.  Heart:  Regular rate and rhythm; no murmurs, clicks, rubs, or gallops. Abdomen:  Soft, non-distended.  BS present.  Non-tender. Rectal:  Inflamed internal hemorrhoids seen bulging from the anus, tender.  DRE with brown stool.  Anoscopy attempted but not able to be performed due to stool present in anal canal/rectal vault.  Msk:  Symmetrical without gross deformities. Pulses:  Normal pulses noted. Extremities:  Edema noted in B/L LE's with some redness/cellulitis type changes. Neurologic:  Alert and oriented x 4;  grossly normal neurologically. Skin:  Intact without significant lesions or rashes. Psych:  Alert and cooperative. Normal mood and affect.  Intake/Output from previous day: 12/02 0701 - 12/03 0700 In: 1000 [IV Piggyback:1000] Out: 700 [Urine:700] Intake/Output this shift: Total I/O In: 240 [P.O.:240] Out: 260 [Urine:260]  Lab Results: Recent  Labs    04/28/19 1938 04/29/19 0005  WBC 8.8 7.6  HGB 11.0* 11.0*  HCT 34.4* 34.5*  PLT 240 232   BMET Recent Labs    04/28/19 1747 04/29/19 0005  NA 135 135  K 4.2 3.9  CL 98 101  CO2 24 22  GLUCOSE 111* 135*  BUN 20 18  CREATININE 1.06* 1.01*  CALCIUM 9.5 8.8*   LFT Recent Labs    04/29/19 0005  PROT 7.3  ALBUMIN 4.0  AST 23  ALT 20  ALKPHOS 131*  BILITOT 0.8   PT/INR Recent Labs    04/29/19 0005  LABPROT 12.9  INR 1.0   IMPRESSION:  -Hematochezia: Very likely due to inflamed internal hemorrhoids as seen on anal/rectal exam today.  Anoscopy was attempted but there is a large amount  of stool present so not able to perform.  Certainly bleeding will be more significant in the setting of antiplatelet use. -Mild anemia:  Chronic, normocytic.  Iron studies normal.  Hgb stable. -Chronic antiplatelet use with Plavix: Unsure of the diagnosis that requires this.  PLAN: -I am putting the patient on a heart healthy diet.  She can be discharged either this evening or tomorrow morning from a GI standpoint. -Agree with hydrocortisone suppositories, which should be continued as an outpatient. -I placed her on MiraLAX twice daily as a regular bowel regimen.  This can be decreased to only once daily if diarrhea or loose stools occur. -Please reevaluate her need for ongoing Plavix use. -She can follow-up with her regular GI physician, Dr. Opal Sidles, as an outpatient for consideration of hemorrhoid banding.   Princella Pellegrini. Dreux Mcgroarty  04/29/2019, 1:54 PM

## 2019-04-30 LAB — CBC
HCT: 36.3 % (ref 36.0–46.0)
Hemoglobin: 11.3 g/dL — ABNORMAL LOW (ref 12.0–15.0)
MCH: 29.8 pg (ref 26.0–34.0)
MCHC: 31.1 g/dL (ref 30.0–36.0)
MCV: 95.8 fL (ref 80.0–100.0)
Platelets: 216 10*3/uL (ref 150–400)
RBC: 3.79 MIL/uL — ABNORMAL LOW (ref 3.87–5.11)
RDW: 14.8 % (ref 11.5–15.5)
WBC: 6.1 10*3/uL (ref 4.0–10.5)
nRBC: 0 % (ref 0.0–0.2)

## 2019-04-30 MED ORDER — LOSARTAN POTASSIUM 50 MG PO TABS: 100.0000 mg | ORAL_TABLET | Freq: Every day | ORAL | Status: DC

## 2019-04-30 MED ORDER — DOCUSATE SODIUM 100 MG PO CAPS: 100.0000 mg | ORAL_CAPSULE | Freq: Every day | ORAL | 0 refills | Status: DC

## 2019-04-30 MED ORDER — POLYETHYLENE GLYCOL 3350 17 G PO PACK: 17.0000 g | PACK | Freq: Two times a day (BID) | ORAL | 2 refills | Status: DC

## 2019-04-30 MED ORDER — HYDROCORTISONE ACETATE 25 MG RE SUPP: 25.0000 mg | Freq: Two times a day (BID) | RECTAL | 0 refills | Status: DC

## 2019-04-30 NOTE — NC FL2 (Signed)
[place] MEDICAID FL2 LEVEL OF CARE SCREENING TOOL     IDENTIFICATION  Patient Name: Cindy Padilla Birthdate: 1941-04-10 Sex: female Admission Date (Current Location): 04/28/2019  [facility] [place]   Facility and Address:  [facility],  [address], [place] [id number]      Provider Number: [id number]  Attending Physician Name and Address:  Kathlen ModyAkula, Vijaya, MD  Relative Name and Phone Number:       Current Level of Care: Hospital Recommended Level of Care: Assisted Living Facility Prior Approval Number:    Date Approved/Denied:   PASRR Number:    Discharge Plan: Domiciliary (Rest home)([place])    Current Diagnoses: Patient Active Problem List   Diagnosis Date Noted  . 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  . BRBPR (bright red blood per rectum) 04/28/2019  . Acute exacerbation of chronic obstructive pulmonary disease (COPD) (HCC) 02/26/2017  . COPD with acute exacerbation (HCC) 02/24/2017  . Bipolar 1 disorder (HCC) 02/24/2017  . Normocytic anemia 02/24/2017  . Morbid obesity (HCC) 04/14/2015  . Upper airway cough syndrome 04/14/2015  . Ventral hernia 04/24/2014  . Constipation 04/24/2014  . Hemorrhoids 04/22/2014  . Abnormality of gait 09/30/2013  . 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 Chronic diastolic CHF (congestive heart failure) (HCC) 07/27/2013  . Dementia with behavioral disturbance (HCC) 07/27/2013  . Weakness 02/09/2013  . Hyponatremia 02/09/2013  . Shortness of breath 02/09/2013  . HTN (hypertension) 02/09/2013  . Other and unspecified hyperlipidemia 02/09/2013    Orientation RESPIRATION BLADDER Height & Weight     Self, Situation, Place  Normal Continent Weight: 84 kg Height:  5\' 3"  (160 cm)  BEHAVIORAL SYMPTOMS/MOOD NEUROLOGICAL BOWEL NUTRITION STATUS  (none) (none) Continent (Regular)  AMBULATORY STATUS COMMUNICATION OF NEEDS Skin   Supervision  Verbally Normal                       Personal Care Assistance Level of Assistance  Bathing, Feeding, Dressing Bathing Assistance: Limited assistance Feeding assistance: Independent Dressing Assistance: Limited assistance     Functional Limitations Info  Sight, Hearing, Speech Sight Info: Adequate Hearing Info: Adequate Speech Info: Adequate    SPECIAL CARE FACTORS FREQUENCY                       Contractures Contractures Info: Not present    Additional Factors Info  Code Status, Allergies Code Status Info: full Allergies Info: Aminoglycosides, Aspirin, Codeine, Ivp dye, Morphine, Neomycin, Penicillins,Phenothizines, Promethazine, Sulfa Antibiotics,Tetracyclines           Current Medications (04/30/2019):      Discharge Medications:   STOP taking these medications   ibuprofen 200 MG tablet Commonly known as: ADVIL     TAKE these medications   acetaminophen 325 MG tablet Commonly known as: TYLENOL Take 650 mg by mouth 3 (three) times daily.   albuterol 108 (90 Base) MCG/ACT inhaler Commonly known as: VENTOLIN HFA Inhale 2 puffs into the lungs every 4 (four) hours as needed for wheezing or shortness of breath.   amLODipine 5 MG tablet Commonly known as: NORVASC Take 5 mg by mouth 2 (two) times daily.   Aspercreme w/Lidocaine 4 % cream Generic drug: lidocaine Apply 1 application topically 3 (three) times daily as needed (for pain).   atorvastatin 40 MG tablet Commonly known as: LIPITOR Take 40 mg by mouth daily.   bisacodyl 10 MG suppository Commonly known as:  DULCOLAX Place 10 mg rectally 3 (three) times daily as needed for moderate constipation.   budesonide-formoterol 160-4.5 MCG/ACT inhaler Commonly known as: SYMBICORT Inhale 2 puffs into the lungs 2 (two) times daily.   cholecalciferol 1000 units tablet Commonly known as: VITAMIN D Take 1,000 Units by mouth daily.   clopidogrel 75 MG tablet Commonly known as:  PLAVIX Take 75 mg by mouth daily.   dicyclomine 10 MG capsule Commonly known as: BENTYL Take 10 mg by mouth 4 (four) times daily.   docusate sodium 100 MG capsule Commonly known as: COLACE Take 1 capsule (100 mg total) by mouth daily. Start taking on: May 01, 2019   donepezil 10 MG tablet Commonly known as: ARICEPT Take 10 mg by mouth at bedtime.   famotidine 20 MG tablet Commonly known as: PEPCID Take 20 mg by mouth 2 (two) times daily.   ferrous sulfate 325 (65 FE) MG tablet Take 325 mg by mouth daily with breakfast.   fluticasone 50 MCG/ACT nasal spray Commonly known as: FLONASE Place 2 sprays into both nostrils daily.   Benetta Spar 200-200-20 MG/5ML suspension Generic drug: alum & mag hydroxide-simeth Take 30 mLs by mouth 4 (four) times daily as needed for indigestion or heartburn.   guaiFENesin 100 MG/5ML liquid Commonly known as: ROBITUSSIN Take 200 mg by mouth every 6 (six) hours as needed for cough.   guaiFENesin-dextromethorphan 100-10 MG/5ML syrup Commonly known as: ROBITUSSIN DM Take 5 mLs by mouth every 4 (four) hours as needed for cough.   hydrocortisone 25 MG suppository Commonly known as: ANUSOL-HC Place 1 suppository (25 mg total) rectally 2 (two) times daily.   Incruse Ellipta 62.5 MCG/INH Aepb Generic drug: umeclidinium bromide Inhale 1 puff into the lungs daily.   ipratropium-albuterol 0.5-2.5 (3) MG/3ML Soln Commonly known as: DUONEB Take 3 mLs by nebulization every 6 (six) hours as needed. What changed: when to take this   lamoTRIgine 200 MG tablet Commonly known as: LAMICTAL Take 200 mg by mouth 2 (two) times daily.   LORazepam 1 MG tablet Commonly known as: ATIVAN Take 1 tablet (1 mg total) by mouth every 12 (twelve) hours. What changed: when to take this   losartan 100 MG tablet Commonly known as: COZAAR Take 100 mg by mouth daily.   magnesium hydroxide 400 MG/5ML suspension Commonly known as: MILK OF  MAGNESIA Take 30 mLs by mouth at bedtime as needed for mild constipation.   magnesium oxide 400 (241.3 Mg) MG tablet Commonly known as: MAG-OX Take 400 mg by mouth daily.   miconazole 2 % cream Commonly known as: MICOTIN Apply 1 application topically daily.   montelukast 10 MG tablet Commonly known as: SINGULAIR Take 10 mg by mouth at bedtime.   nystatin powder Generic drug: nystatin Apply 1 Bottle topically 2 (two) times daily as needed (itchy rash).   pantoprazole 20 MG tablet Commonly known as: PROTONIX Take 20 mg by mouth daily.   polyethylene glycol 17 g packet Commonly known as: MIRALAX / GLYCOLAX Take 17 g by mouth 2 (two) times daily. What changed:   when to take this  reasons to take this   potassium chloride SA 20 MEQ tablet Commonly known as: KLOR-CON Take 20 mEq by mouth daily.   QUEtiapine 50 MG tablet Commonly known as: SEROQUEL Take 125-150 mg by mouth 2 (two) times daily. Take 2.5 tablets (125 mg) in the morning and Take 3 tablets (150 mg) in the evening   senna-docusate 8.6-50 MG tablet Commonly known as: Senokot-S Take  1 tablet by mouth 2 (two) times daily.   tiotropium 18 MCG inhalation capsule Commonly known as: Spiriva HandiHaler Place 1 capsule (18 mcg total) into inhaler and inhale daily.   torsemide 100 MG tablet Commonly known as: DEMADEX Take 100 mg by mouth daily.     Relevant Imaging Results:  Relevant Lab Results:   Additional Information    Ida Rogue, LCSW

## 2019-04-30 NOTE — Progress Notes (Signed)
Pt discharged to [facility] by [facility]. Discharge instructions and medication education provided to pt.

## 2019-04-30 NOTE — Discharge Summary (Signed)
Physician Discharge Summary  Cindy Padilla UKG:[id number] DOB: Aug 27, 1940 DOA: 04/28/2019  PCP: Sande Brothers, MD  Admit date: 04/28/2019 Discharge date: 04/30/2019  Admitted From: memory care unit.  Disposition: [facility].   Recommendations for Outpatient Follow-up:  1. Follow up with PCP in 1-2 weeks 2. Please obtain BMP/CBC in one week Please follow up with Gastroenterology in one to two weeks.   Discharge Condition: guarded.  CODE STATUS: full code.  Diet recommendation: Heart Healthy  Brief/Interim Summary:  78 year old lady with prior h/o chronic diastolic CHF, COPD, bipolar disorder, dementia, hypertension,  Diabetes mellitus, IBS, hemorrhoids,was brought in from ALF memory care unit for rectal bleeding. She reports following up with Dr Patrick North at [facility] in the past.  She denies any bloody bowel movements overnight. And her H&H has remained stable around 11.   Discharge Diagnoses:  Principal Problem:   BRBPR (bright red blood per rectum) Active Problems:   HTN (hypertension)   Chronic diastolic CHF (congestive heart failure) (HCC)   Dementia with behavioral disturbance (HCC)   Constipation   Morbid obesity (HCC)   Bipolar 1 disorder (HCC)   COPD with chronic bronchitis (HCC)  Chronic constipation Increased miralax to BID, along with senna, colace and MOM.   Rectal bleeding:  Differential include hemorrhoidal vs diverticular bleed.  Pt denies using NSAIDS, GI consulted for further evaluation. GI recommended further evaluation of the internal hemorrhoids as outpatient with her GI.  Her H&H remains stable around 11.  Advanced her diet, no more rectal bleeding evident.  She will be discharged on anusol cream.     Hypertension:  Sub optimally controlled.  Probably sec to holding the home meds. Restarted the meds.     Chronic diastolic heart failure:  No echocardiogram on file.  She appears to be euvolemic. Resume  Home meds on discharge.     H/O COPD with bronchitis:  She does not appear to be wheezing .  Resume home bronchodilators.    Morbid obesity.  Body mass index is 32.61 kg/m.  mild AKI;  Probably from dehydration. Baseline creatinine between 0.6 to 0.7.  Held lasix for 2 days. Repeat renal parameters in one week.   Bipolar disorder/ dementia:  Resume home meds.   Discharge Instructions   Allergies as of 04/30/2019      Reactions   Aminoglycosides Other (See Comments)   Reaction:  Unknown    Aspirin Other (See Comments)   Reaction:  Unknown    Codeine Other (See Comments)   Reaction:  Unknown    Ivp Dye [iodinated Diagnostic Agents] Other (See Comments)   Reaction:  Unknown    Morphine And Related Other (See Comments)   Reaction:  Unknown    Neomycin Other (See Comments)   Reaction:  Unknown    Penicillins Rash, Other (See Comments)   Has patient had a PCN reaction causing immediate rash, facial/tongue/throat swelling, SOB or lightheadedness with hypotension: No Has patient had a PCN reaction causing severe rash involving mucus membranes or skin necrosis: No Has patient had a PCN reaction that required hospitalization No Has patient had a PCN reaction occurring within the last 10 years: No If all of the above answers are "NO", then may proceed with Cephalosporin use.   Phenothiazines Other (See Comments)   Reaction:  Unknown    Promethazine Other (See Comments)   Reaction:  Unknown    Sulfa Antibiotics Other (See Comments)   Reaction:  Unknown    Tetracyclines & Related  Other (See Comments)   Reaction: Unknown       Medication List    STOP taking these medications   ibuprofen 200 MG tablet Commonly known as: ADVIL     TAKE these medications   acetaminophen 325 MG tablet Commonly known as: TYLENOL Take 650 mg by mouth 3 (three) times daily.   albuterol 108 (90 Base) MCG/ACT inhaler Commonly known as: VENTOLIN HFA Inhale 2 puffs into the lungs every 4 (four) hours as needed  for wheezing or shortness of breath.   amLODipine 5 MG tablet Commonly known as: NORVASC Take 5 mg by mouth 2 (two) times daily.   Aspercreme w/Lidocaine 4 % cream Generic drug: lidocaine Apply 1 application topically 3 (three) times daily as needed (for pain).   atorvastatin 40 MG tablet Commonly known as: LIPITOR Take 40 mg by mouth daily.   bisacodyl 10 MG suppository Commonly known as: DULCOLAX Place 10 mg rectally 3 (three) times daily as needed for moderate constipation.   budesonide-formoterol 160-4.5 MCG/ACT inhaler Commonly known as: SYMBICORT Inhale 2 puffs into the lungs 2 (two) times daily.   cholecalciferol 1000 units tablet Commonly known as: VITAMIN D Take 1,000 Units by mouth daily.   clopidogrel 75 MG tablet Commonly known as: PLAVIX Take 75 mg by mouth daily.   dicyclomine 10 MG capsule Commonly known as: BENTYL Take 10 mg by mouth 4 (four) times daily.   docusate sodium 100 MG capsule Commonly known as: COLACE Take 1 capsule (100 mg total) by mouth daily. Start taking on: May 01, 2019   donepezil 10 MG tablet Commonly known as: ARICEPT Take 10 mg by mouth at bedtime.   famotidine 20 MG tablet Commonly known as: PEPCID Take 20 mg by mouth 2 (two) times daily.   ferrous sulfate 325 (65 FE) MG tablet Take 325 mg by mouth daily with breakfast.   fluticasone 50 MCG/ACT nasal spray Commonly known as: FLONASE Place 2 sprays into both nostrils daily.   Venida Jarvis 200-200-20 MG/5ML suspension Generic drug: alum & mag hydroxide-simeth Take 30 mLs by mouth 4 (four) times daily as needed for indigestion or heartburn.   guaiFENesin 100 MG/5ML liquid Commonly known as: ROBITUSSIN Take 200 mg by mouth every 6 (six) hours as needed for cough.   guaiFENesin-dextromethorphan 100-10 MG/5ML syrup Commonly known as: ROBITUSSIN DM Take 5 mLs by mouth every 4 (four) hours as needed for cough.   hydrocortisone 25 MG suppository Commonly known as:  ANUSOL-HC Place 1 suppository (25 mg total) rectally 2 (two) times daily.   Incruse Ellipta 62.5 MCG/INH Aepb Generic drug: umeclidinium bromide Inhale 1 puff into the lungs daily.   ipratropium-albuterol 0.5-2.5 (3) MG/3ML Soln Commonly known as: DUONEB Take 3 mLs by nebulization every 6 (six) hours as needed. What changed: when to take this   lamoTRIgine 200 MG tablet Commonly known as: LAMICTAL Take 200 mg by mouth 2 (two) times daily.   LORazepam 1 MG tablet Commonly known as: ATIVAN Take 1 tablet (1 mg total) by mouth every 12 (twelve) hours. What changed: when to take this   losartan 100 MG tablet Commonly known as: COZAAR Take 100 mg by mouth daily.   magnesium hydroxide 400 MG/5ML suspension Commonly known as: MILK OF MAGNESIA Take 30 mLs by mouth at bedtime as needed for mild constipation.   magnesium oxide 400 (241.3 Mg) MG tablet Commonly known as: MAG-OX Take 400 mg by mouth daily.   miconazole 2 % cream Commonly known as: MICOTIN Apply 1  application topically daily.   montelukast 10 MG tablet Commonly known as: SINGULAIR Take 10 mg by mouth at bedtime.   nystatin powder Generic drug: nystatin Apply 1 Bottle topically 2 (two) times daily as needed (itchy rash).   pantoprazole 20 MG tablet Commonly known as: PROTONIX Take 20 mg by mouth daily.   polyethylene glycol 17 g packet Commonly known as: MIRALAX / GLYCOLAX Take 17 g by mouth 2 (two) times daily. What changed:   when to take this  reasons to take this   potassium chloride SA 20 MEQ tablet Commonly known as: KLOR-CON Take 20 mEq by mouth daily.   QUEtiapine 50 MG tablet Commonly known as: SEROQUEL Take 125-150 mg by mouth 2 (two) times daily. Take 2.5 tablets (125 mg) in the morning and Take 3 tablets (150 mg) in the evening   senna-docusate 8.6-50 MG tablet Commonly known as: Senokot-S Take 1 tablet by mouth 2 (two) times daily.   tiotropium 18 MCG inhalation capsule Commonly  known as: Spiriva HandiHaler Place 1 capsule (18 mcg total) into inhaler and inhale daily.   torsemide 100 MG tablet Commonly known as: DEMADEX Take 100 mg by mouth daily.      Follow-up Information    [facility] [place] [facility].   Specialty: Assisted Living Facility Contact information: [address] [place] [address] [phone]        Ron ParkerBowen, Samuel, MD. Schedule an appointment as soon as possible for a visit in 1 week(s).   Specialty: Internal Medicine Contact information: [address] [place] [address] [phone]          Allergies  Allergen Reactions  . Aminoglycosides Other (See Comments)    Reaction:  Unknown   . Aspirin Other (See Comments)    Reaction:  Unknown   . Codeine Other (See Comments)    Reaction:  Unknown   . Ivp Dye [Iodinated Diagnostic Agents] Other (See Comments)    Reaction:  Unknown   . Morphine And Related Other (See Comments)    Reaction:  Unknown   . Neomycin Other (See Comments)    Reaction:  Unknown   . Penicillins Rash and Other (See Comments)    Has patient had a PCN reaction causing immediate rash, facial/tongue/throat swelling, SOB or lightheadedness with hypotension: No Has patient had a PCN reaction causing severe rash involving mucus membranes or skin necrosis: No Has patient had a PCN reaction that required hospitalization No Has patient had a PCN reaction occurring within the last 10 years: No If all of the above answers are "NO", then may proceed with Cephalosporin use.  Marland Kitchen. Phenothiazines Other (See Comments)    Reaction:  Unknown   . Promethazine Other (See Comments)    Reaction:  Unknown   . Sulfa Antibiotics Other (See Comments)    Reaction:  Unknown   . Tetracyclines & Related Other (See Comments)    Reaction: Unknown     Consultations:  Gastroenterology.    Procedures/Studies:  No results found. Anoscopy    Subjective: No new complaints, wants to take a bath before going back.    Discharge Exam: Vitals:   04/30/19 0600 04/30/19 0806  BP: (!) 171/63   Pulse: 69   Resp: 18   Temp: 98 F (36.7 C)   SpO2: 92% 95%   Vitals:   04/30/19 0500 04/30/19 0600 04/30/19 0806 04/30/19 0900  BP:  (!) 171/63    Pulse:  69    Resp:  18  Temp:  98 F (36.7 C)    TempSrc:  Oral    SpO2:  92% 95%   Weight: 84.1 kg   84 kg  Height:        General: Pt is alert, awake, not in acute distress Cardiovascular: RRR, S1/S2 +, no rubs, no gallops Respiratory: CTA bilaterally, no wheezing, no rhonchi Abdominal: Soft, NT, ND, bowel sounds + Extremities: no edema, no cyanosis    The results of significant diagnostics from this hospitalization (including imaging, microbiology, ancillary and laboratory) are listed below for reference.     Microbiology: Recent Results (from the past 240 hour(s))  SARS CORONAVIRUS 2 (TAT 6-24 HRS) Nasopharyngeal Nasopharyngeal Swab     Status: None   Collection Time: 04/28/19 10:25 PM   Specimen: Nasopharyngeal Swab  Result Value Ref Range Status   SARS Coronavirus 2 NEGATIVE NEGATIVE Final    Comment: (NOTE) SARS-CoV-2 target nucleic acids are NOT DETECTED. The SARS-CoV-2 RNA is generally detectable in upper and lower respiratory specimens during the acute phase of infection. Negative results do not preclude SARS-CoV-2 infection, do not rule out co-infections with other pathogens, and should not be used as the sole basis for treatment or other patient management decisions. Negative results must be combined with clinical observations, patient history, and epidemiological information. The expected result is Negative. Fact Sheet for Patients: HairSlick.no Fact Sheet for Healthcare Providers: quierodirigir.com This test is not yet approved or cleared by the [place] FDA and  has been authorized for detection and/or diagnosis of SARS-CoV-2 by FDA under an Emergency Use  Authorization (EUA). This EUA will remain  in effect (meaning this test can be used) for the duration of the COVID-19 declaration under Section 56 4(b)(1) of the Act, 21 U.S.C. section 360bbb-3(b)(1), unless the authorization is terminated or revoked sooner. Performed at [facility], [address] [place], [place] [address]      Labs: BNP (last 3 results) No results for input(s): BNP in the last 8760 hours. Basic Metabolic Panel: Recent Labs  Lab 04/28/19 1747 04/29/19 0005  NA 135 135  K 4.2 3.9  CL 98 101  CO2 24 22  GLUCOSE 111* 135*  BUN 20 18  CREATININE 1.06* 1.01*  CALCIUM 9.5 8.8*   Liver Function Tests: Recent Labs  Lab 04/29/19 0005  AST 23  ALT 20  ALKPHOS 131*  BILITOT 0.8  PROT 7.3  ALBUMIN 4.0   No results for input(s): LIPASE, AMYLASE in the last 168 hours. No results for input(s): AMMONIA in the last 168 hours. CBC: Recent Labs  Lab 04/28/19 1938 04/29/19 0005 04/30/19 0621  WBC 8.8 7.6 6.1  NEUTROABS 5.6  --   --   HGB 11.0* 11.0* 11.3*  HCT 34.4* 34.5* 36.3  MCV 92.5 92.7 95.8  PLT 240 232 216   Cardiac Enzymes: No results for input(s): CKTOTAL, CKMB, CKMBINDEX, TROPONINI in the last 168 hours. BNP: Invalid input(s): POCBNP CBG: No results for input(s): GLUCAP in the last 168 hours. D-Dimer No results for input(s): DDIMER in the last 72 hours. Hgb A1c No results for input(s): HGBA1C in the last 72 hours. Lipid Profile No results for input(s): CHOL, HDL, LDLCALC, TRIG, CHOLHDL, LDLDIRECT in the last 72 hours. Thyroid function studies No results for input(s): TSH, T4TOTAL, T3FREE, THYROIDAB in the last 72 hours.  Invalid input(s): FREET3 Anemia work up Recent Labs    04/29/19 0544  FERRITIN 74  TIBC 333  IRON 40   Urinalysis  Component Value Date/Time   COLORURINE COLORLESS (A) 03/30/2017 1813   APPEARANCEUR CLEAR 03/30/2017 1813   LABSPEC 1.003 (L) 03/30/2017 1813   PHURINE 7.0 03/30/2017 1813   GLUCOSEU  NEGATIVE 03/30/2017 1813   HGBUR NEGATIVE 03/30/2017 1813   BILIRUBINUR NEGATIVE 03/30/2017 1813   KETONESUR NEGATIVE 03/30/2017 1813   PROTEINUR NEGATIVE 03/30/2017 1813   UROBILINOGEN 0.2 09/19/2014 1633   NITRITE NEGATIVE 03/30/2017 1813   LEUKOCYTESUR NEGATIVE 03/30/2017 1813   Sepsis Labs Invalid input(s): PROCALCITONIN,  WBC,  LACTICIDVEN Microbiology Recent Results (from the past 240 hour(s))  SARS CORONAVIRUS 2 (TAT 6-24 HRS) Nasopharyngeal Nasopharyngeal Swab     Status: None   Collection Time: 04/28/19 10:25 PM   Specimen: Nasopharyngeal Swab  Result Value Ref Range Status   SARS Coronavirus 2 NEGATIVE NEGATIVE Final    Comment: (NOTE) SARS-CoV-2 target nucleic acids are NOT DETECTED. The SARS-CoV-2 RNA is generally detectable in upper and lower respiratory specimens during the acute phase of infection. Negative results do not preclude SARS-CoV-2 infection, do not rule out co-infections with other pathogens, and should not be used as the sole basis for treatment or other patient management decisions. Negative results must be combined with clinical observations, patient history, and epidemiological information. The expected result is Negative. Fact Sheet for Patients: HairSlick.no Fact Sheet for Healthcare Providers: quierodirigir.com This test is not yet approved or cleared by the [place] FDA and  has been authorized for detection and/or diagnosis of SARS-CoV-2 by FDA under an Emergency Use Authorization (EUA). This EUA will remain  in effect (meaning this test can be used) for the duration of the COVID-19 declaration under Section 56 4(b)(1) of the Act, 21 U.S.C. section 360bbb-3(b)(1), unless the authorization is terminated or revoked sooner. Performed at [facility], [address], [place], [place] [address]      Time coordinating discharge: 32  minutes  SIGNED:   Kathlen Mody,  MD  [facility] 04/30/2019, 1:28 PM Pager   If 7PM-7AM, please contact night-coverage [email or web address] Password [email or web address]

## 2019-04-30 NOTE — Evaluation (Signed)
Physical Therapy Evaluation Patient Details Name: Cindy Padilla MRN: [id number] DOB: 01-20-1941 Today's Date: 04/30/2019   History of Present Illness  78 year old lady with prior h/o chronic diastolic CHF, COPD, bipolar disorder, dementia, hypertension,  Diabetes mellitus, IBS, hemorrhoids,was brought in from ALF memory care unit for rectal bleeding.  Clinical Impression  Pt ambulated 160' with RW, no loss of balance. She had 2-3/4 dyspnea with walking, SaO2 94% on room air. She appears to be near baseline of modified independence with mobility. She is safe to DC to her memory care unit from PT standpoint. No further acute PT indicated, will sign off.     Follow Up Recommendations No PT follow up    Equipment Recommendations  None recommended by PT    Recommendations for Other Services       Precautions / Restrictions Precautions Precautions: None Precaution Comments: denies falls in past 1 year Restrictions Weight Bearing Restrictions: No      Mobility  Bed Mobility               General bed mobility comments: up at edge of bed  Transfers Overall transfer level: Modified independent Equipment used: Rolling walker (2 wheeled)                Ambulation/Gait Ambulation/Gait assistance: Modified independent (Device/Increase time) Gait Distance (Feet): 160 Feet Assistive device: Rolling walker (2 wheeled) Gait Pattern/deviations: Step-through pattern;Trunk flexed Gait velocity: WFL   General Gait Details: steady, no loss of balance, distance limited by 3/4 dyspnea, SaO2 94% on room air 1 minute after walking  Stairs            Wheelchair Mobility    Modified Rankin (Stroke Patients Only)       Balance Overall balance assessment: Modified Independent                                           Pertinent Vitals/Pain Pain Assessment: Faces Faces Pain Scale: Hurts little more Pain Location: rectum from frequent BMs Pain  Descriptors / Indicators: Sore Pain Intervention(s): Limited activity within patient's tolerance;Monitored during session    Home Living Family/patient expects to be discharged to:: Assisted living               Home Equipment: Walker - 2 wheels      Prior Function Level of Independence: Independent with assistive device(s)         Comments: pt reports she walks with a RW and is independent with bathing/dressing     Hand Dominance        Extremity/Trunk Assessment   Upper Extremity Assessment Upper Extremity Assessment: Overall WFL for tasks assessed    Lower Extremity Assessment Lower Extremity Assessment: Overall WFL for tasks assessed    Cervical / Trunk Assessment Cervical / Trunk Assessment: Normal  Communication   Communication: No difficulties  Cognition Arousal/Alertness: Awake/alert Behavior During Therapy: WFL for tasks assessed/performed Overall Cognitive Status: No family/caregiver present to determine baseline cognitive functioning                                 General Comments: pt very talkative and had difficulty focusing on task at hand, required verbal cues to redirect; oriented and able to follow commands      General Comments  Exercises     Assessment/Plan    PT Assessment Patent does not need any further PT services  PT Problem List         PT Treatment Interventions      PT Goals (Current goals can be found in the Care Plan section)  Acute Rehab PT Goals Patient Stated Goal: return to [facility] PT Goal Formulation: All assessment and education complete, DC therapy    Frequency     Barriers to discharge        Co-evaluation               AM-PAC PT "6 Clicks" Mobility  Outcome Measure Help needed turning from your back to your side while in a flat bed without using bedrails?: None Help needed moving from lying on your back to sitting on the side of a flat bed without using bedrails?:  None Help needed moving to and from a bed to a chair (including a wheelchair)?: None Help needed standing up from a chair using your arms (e.g., wheelchair or bedside chair)?: None Help needed to walk in hospital room?: None Help needed climbing 3-5 steps with a railing? : A Little 6 Click Score: 23    End of Session Equipment Utilized During Treatment: Gait belt Activity Tolerance: Patient tolerated treatment well Patient left: in bed;with call bell/phone within reach;with bed alarm set Nurse Communication: Mobility status      Time: 1435-1452 PT Time Calculation (min) (ACUTE ONLY): 17 min   Charges:   PT Evaluation $PT Eval Low Complexity: 1 Low         Tamala Ser PT 04/30/2019  Acute Rehabilitation Services Pager [phone] Office [phone]

## 2019-04-30 NOTE — TOC Transition Note (Addendum)
Transition of Care [facility]) - CM/SW Discharge Note   Patient Details  Name: Cindy Padilla MRN: [id number] Date of Birth: [birth year]  [facility] CM/SW Contact:  Trish Mage, LCSW Phone Number: 04/30/2019, 3:20 PM   Clinical Narrative:   Patient to discharge today.  Returning to [facility] ALF.  Sent them signed FL2.  They declined to pick up patient.  PTAR set up. Nursing, please call report to Tammy at [phone]. TOC sign off.    Final next level of care: Assisted Living Barriers to Discharge: No Barriers Identified   Patient Goals and CMS Choice        Discharge Placement                       Discharge Plan and Services   Discharge Planning Services: CM Consult                                 Social Determinants of Health (SDOH) Interventions     Readmission Risk Interventions No flowsheet data found.

## 2019-04-30 NOTE — Care Management Important Message (Signed)
Important Message  Patient Details IM Letter given to [facility] to present to the Patient Name: Cindy Padilla MRN: [id number] Date of Birth: Apr 08, 1941   Medicare Important Message Given:  Yes     Kerin Salen 04/30/2019, 11:30 AM

## 2019-10-15 ENCOUNTER — Other Ambulatory Visit: Payer: Self-pay

## 2019-10-15 ENCOUNTER — Encounter (HOSPITAL_COMMUNITY): Payer: Self-pay

## 2019-10-15 ENCOUNTER — Emergency Department (HOSPITAL_COMMUNITY): Payer: Medicare Other

## 2019-10-15 ENCOUNTER — Observation Stay (HOSPITAL_COMMUNITY)
Admission: EM | Admit: 2019-10-15 | Discharge: 2019-10-16 | Disposition: A | Discharge status: Skilled Nursing Facility | Payer: Medicare Other | Attending: Internal Medicine | Admitting: Internal Medicine

## 2019-10-15 ENCOUNTER — Observation Stay (HOSPITAL_COMMUNITY): Payer: Medicare Other

## 2019-10-15 DIAGNOSIS — F0390 Unspecified dementia without behavioral disturbance: Secondary | ICD-10-CM | POA: Diagnosis not present

## 2019-10-15 DIAGNOSIS — N189 Chronic kidney disease, unspecified: Secondary | ICD-10-CM | POA: Diagnosis not present

## 2019-10-15 DIAGNOSIS — Z882 Allergy status to sulfonamides status: Secondary | ICD-10-CM | POA: Diagnosis not present

## 2019-10-15 DIAGNOSIS — F419 Anxiety disorder, unspecified: Secondary | ICD-10-CM | POA: Diagnosis not present

## 2019-10-15 DIAGNOSIS — Z880 Allergy status to penicillin: Secondary | ICD-10-CM | POA: Insufficient documentation

## 2019-10-15 DIAGNOSIS — K219 Gastro-esophageal reflux disease without esophagitis: Secondary | ICD-10-CM | POA: Diagnosis not present

## 2019-10-15 DIAGNOSIS — Z20822 Contact with and (suspected) exposure to covid-19: Secondary | ICD-10-CM | POA: Diagnosis not present

## 2019-10-15 DIAGNOSIS — J449 Chronic obstructive pulmonary disease, unspecified: Secondary | ICD-10-CM | POA: Diagnosis not present

## 2019-10-15 DIAGNOSIS — Z881 Allergy status to other antibiotic agents status: Secondary | ICD-10-CM | POA: Diagnosis not present

## 2019-10-15 DIAGNOSIS — I951 Orthostatic hypotension: Principal | ICD-10-CM | POA: Insufficient documentation

## 2019-10-15 DIAGNOSIS — I5032 Chronic diastolic (congestive) heart failure: Secondary | ICD-10-CM | POA: Diagnosis not present

## 2019-10-15 DIAGNOSIS — Z886 Allergy status to analgesic agent status: Secondary | ICD-10-CM | POA: Diagnosis not present

## 2019-10-15 DIAGNOSIS — E119 Type 2 diabetes mellitus without complications: Secondary | ICD-10-CM | POA: Insufficient documentation

## 2019-10-15 DIAGNOSIS — J4489 Other specified chronic obstructive pulmonary disease: Secondary | ICD-10-CM | POA: Diagnosis present

## 2019-10-15 DIAGNOSIS — Z885 Allergy status to narcotic agent status: Secondary | ICD-10-CM | POA: Insufficient documentation

## 2019-10-15 DIAGNOSIS — M1990 Unspecified osteoarthritis, unspecified site: Secondary | ICD-10-CM | POA: Insufficient documentation

## 2019-10-15 DIAGNOSIS — F03918 Unspecified dementia, unspecified severity, with other behavioral disturbance: Secondary | ICD-10-CM | POA: Diagnosis present

## 2019-10-15 DIAGNOSIS — Z87891 Personal history of nicotine dependence: Secondary | ICD-10-CM | POA: Diagnosis not present

## 2019-10-15 DIAGNOSIS — F319 Bipolar disorder, unspecified: Secondary | ICD-10-CM | POA: Diagnosis not present

## 2019-10-15 DIAGNOSIS — Z888 Allergy status to other drugs, medicaments and biological substances status: Secondary | ICD-10-CM | POA: Insufficient documentation

## 2019-10-15 DIAGNOSIS — R55 Syncope and collapse: Secondary | ICD-10-CM | POA: Diagnosis present

## 2019-10-15 DIAGNOSIS — I10 Essential (primary) hypertension: Secondary | ICD-10-CM | POA: Diagnosis present

## 2019-10-15 DIAGNOSIS — I130 Hypertensive heart and chronic kidney disease with heart failure and stage 1 through stage 4 chronic kidney disease, or unspecified chronic kidney disease: Secondary | ICD-10-CM | POA: Insufficient documentation

## 2019-10-15 DIAGNOSIS — Z7951 Long term (current) use of inhaled steroids: Secondary | ICD-10-CM | POA: Diagnosis not present

## 2019-10-15 DIAGNOSIS — Z91041 Radiographic dye allergy status: Secondary | ICD-10-CM | POA: Insufficient documentation

## 2019-10-15 DIAGNOSIS — Z66 Do not resuscitate: Secondary | ICD-10-CM | POA: Insufficient documentation

## 2019-10-15 DIAGNOSIS — Z79899 Other long term (current) drug therapy: Secondary | ICD-10-CM | POA: Diagnosis not present

## 2019-10-15 DIAGNOSIS — I082 Rheumatic disorders of both aortic and tricuspid valves: Secondary | ICD-10-CM | POA: Insufficient documentation

## 2019-10-15 DIAGNOSIS — I878 Other specified disorders of veins: Secondary | ICD-10-CM | POA: Insufficient documentation

## 2019-10-15 DIAGNOSIS — R42 Dizziness and giddiness: Secondary | ICD-10-CM | POA: Diagnosis present

## 2019-10-15 LAB — CBC
HCT: 38.5 % (ref 36.0–46.0)
HCT: 39.2 % (ref 36.0–46.0)
Hemoglobin: 12.1 g/dL (ref 12.0–15.0)
Hemoglobin: 12.7 g/dL (ref 12.0–15.0)
MCH: 29.8 pg (ref 26.0–34.0)
MCH: 29.8 pg (ref 26.0–34.0)
MCHC: 31.4 g/dL (ref 30.0–36.0)
MCHC: 32.4 g/dL (ref 30.0–36.0)
MCV: 94.8 fL (ref 80.0–100.0)
MCV: 92 fL (ref 80.0–100.0)
Platelets: 245 10*3/uL (ref 150–400)
Platelets: 284 10*3/uL (ref 150–400)
RBC: 4.06 MIL/uL (ref 3.87–5.11)
RBC: 4.26 MIL/uL (ref 3.87–5.11)
RDW: 14.6 % (ref 11.5–15.5)
RDW: 14.6 % (ref 11.5–15.5)
WBC: 9.1 10*3/uL (ref 4.0–10.5)
WBC: 7.4 10*3/uL (ref 4.0–10.5)
nRBC: 0 % (ref 0.0–0.2)
nRBC: 0 % (ref 0.0–0.2)

## 2019-10-15 LAB — COMPREHENSIVE METABOLIC PANEL
ALT: 17 U/L (ref 0–44)
AST: 22 U/L (ref 15–41)
Albumin: 3.6 g/dL (ref 3.5–5.0)
Alkaline Phosphatase: 110 U/L (ref 38–126)
Anion gap: 15 (ref 5–15)
BUN: 17 mg/dL (ref 8–23)
CO2: 26 mmol/L (ref 22–32)
Calcium: 9.2 mg/dL (ref 8.9–10.3)
Chloride: 98 mmol/L (ref 98–111)
Creatinine, Ser: 1.05 mg/dL — ABNORMAL HIGH (ref 0.44–1.00)
GFR calc Af Amer: 58 mL/min — ABNORMAL LOW (ref >60)
GFR calc non Af Amer: 50 mL/min — ABNORMAL LOW (ref >60)
Glucose, Bld: 128 mg/dL — ABNORMAL HIGH (ref 70–99)
Potassium: 4.2 mmol/L (ref 3.5–5.1)
Sodium: 139 mmol/L (ref 135–145)
Total Bilirubin: 0.9 mg/dL (ref 0.3–1.2)
Total Protein: 6.9 g/dL (ref 6.5–8.1)

## 2019-10-15 LAB — URINALYSIS, ROUTINE W REFLEX MICROSCOPIC
Bilirubin Urine: NEGATIVE
Glucose, UA: NEGATIVE mg/dL
Hgb urine dipstick: NEGATIVE
Ketones, ur: NEGATIVE mg/dL
Leukocytes,Ua: NEGATIVE
Nitrite: NEGATIVE
Protein, ur: NEGATIVE mg/dL
Specific Gravity, Urine: 1.004 — ABNORMAL LOW (ref 1.005–1.030)
pH: 8 (ref 5.0–8.0)

## 2019-10-15 LAB — CREATININE, SERUM
Creatinine, Ser: 1.01 mg/dL — ABNORMAL HIGH (ref 0.44–1.00)
GFR calc Af Amer: >60 mL/min (ref >60)
GFR calc non Af Amer: 53 mL/min — ABNORMAL LOW (ref >60)

## 2019-10-15 LAB — TROPONIN I (HIGH SENSITIVITY)
Troponin I (High Sensitivity): 9 ng/L (ref <18)
Troponin I (High Sensitivity): 9 ng/L (ref <18)

## 2019-10-15 LAB — SARS CORONAVIRUS 2 BY RT PCR (HOSPITAL ORDER, PERFORMED IN ~~LOC~~ HOSPITAL LAB): SARS Coronavirus 2: NEGATIVE

## 2019-10-15 MED ORDER — ALBUTEROL SULFATE HFA 108 (90 BASE) MCG/ACT IN AERS
2.0000 | INHALATION_SPRAY | RESPIRATORY_TRACT | Status: DC | PRN
  Filled 2019-10-15: qty 6.7

## 2019-10-15 MED ORDER — LORAZEPAM 1 MG PO TABS
1.0000 mg | ORAL_TABLET | Freq: Two times a day (BID) | ORAL | Status: DC
  Administered 2019-10-15 – 2019-10-16 (×3): 1 mg via ORAL
  Filled 2019-10-15 (×3): qty 1

## 2019-10-15 MED ORDER — BISACODYL 10 MG RE SUPP: 10.0000 mg | Freq: Three times a day (TID) | RECTAL | Status: DC | PRN

## 2019-10-15 MED ORDER — UMECLIDINIUM BROMIDE 62.5 MCG/INH IN AEPB
1.0000 | INHALATION_SPRAY | Freq: Every day | RESPIRATORY_TRACT | Status: DC
  Filled 2019-10-15 (×2): qty 7

## 2019-10-15 MED ORDER — LAMOTRIGINE 100 MG PO TABS
200.0000 mg | ORAL_TABLET | Freq: Two times a day (BID) | ORAL | Status: DC
  Administered 2019-10-15 – 2019-10-16 (×3): 200 mg via ORAL
  Filled 2019-10-15 (×5): qty 2

## 2019-10-15 MED ORDER — SODIUM CHLORIDE 0.9 % IV SOLN: INTRAVENOUS | Status: AC

## 2019-10-15 MED ORDER — VITAMIN D3 25 MCG (1000 UNIT) PO TABS
2000.0000 [IU] | ORAL_TABLET | Freq: Every day | ORAL | Status: DC
  Administered 2019-10-16: 2000 [IU] via ORAL
  Filled 2019-10-15 (×2): qty 2

## 2019-10-15 MED ORDER — MAGNESIUM OXIDE 400 (241.3 MG) MG PO TABS
400.0000 mg | ORAL_TABLET | Freq: Every day | ORAL | Status: DC
  Administered 2019-10-15 – 2019-10-16 (×2): 400 mg via ORAL
  Filled 2019-10-15 (×2): qty 1

## 2019-10-15 MED ORDER — FLUTICASONE PROPIONATE 50 MCG/ACT NA SUSP
2.0000 | Freq: Every day | NASAL | Status: DC
  Administered 2019-10-16: 2 via NASAL
  Filled 2019-10-15 (×2): qty 16

## 2019-10-15 MED ORDER — ATORVASTATIN CALCIUM 40 MG PO TABS
40.0000 mg | ORAL_TABLET | Freq: Every day | ORAL | Status: DC
  Administered 2019-10-15 – 2019-10-16 (×2): 40 mg via ORAL
  Filled 2019-10-15 (×2): qty 1

## 2019-10-15 MED ORDER — QUETIAPINE FUMARATE 100 MG PO TABS
100.0000 mg | ORAL_TABLET | Freq: Every morning | ORAL | Status: DC
  Administered 2019-10-16: 100 mg via ORAL
  Filled 2019-10-15: qty 1

## 2019-10-15 MED ORDER — QUETIAPINE FUMARATE 50 MG PO TABS
150.0000 mg | ORAL_TABLET | Freq: Every evening | ORAL | Status: DC
  Administered 2019-10-16: 150 mg via ORAL
  Filled 2019-10-15 (×2): qty 1

## 2019-10-15 MED ORDER — ALUM & MAG HYDROXIDE-SIMETH 200-200-20 MG/5ML PO SUSP: 30.0000 mL | Freq: Four times a day (QID) | ORAL | Status: DC | PRN

## 2019-10-15 MED ORDER — CLOPIDOGREL BISULFATE 75 MG PO TABS
75.0000 mg | ORAL_TABLET | Freq: Every day | ORAL | Status: DC
  Administered 2019-10-15 – 2019-10-16 (×2): 75 mg via ORAL
  Filled 2019-10-15 (×2): qty 1

## 2019-10-15 MED ORDER — ACETAMINOPHEN 650 MG RE SUPP: 650.0000 mg | Freq: Four times a day (QID) | RECTAL | Status: DC | PRN

## 2019-10-15 MED ORDER — MONTELUKAST SODIUM 10 MG PO TABS
10.0000 mg | ORAL_TABLET | Freq: Every day | ORAL | Status: DC
  Administered 2019-10-15: 10 mg via ORAL
  Filled 2019-10-15 (×2): qty 1

## 2019-10-15 MED ORDER — POLYETHYLENE GLYCOL 3350 17 G PO PACK
17.0000 g | PACK | Freq: Every day | ORAL | Status: DC
  Administered 2019-10-16: 17 g via ORAL
  Filled 2019-10-15: qty 1

## 2019-10-15 MED ORDER — GUAIFENESIN-DM 100-10 MG/5ML PO SYRP: 5.0000 mL | ORAL_SOLUTION | ORAL | Status: DC | PRN

## 2019-10-15 MED ORDER — MOMETASONE FURO-FORMOTEROL FUM 200-5 MCG/ACT IN AERO
2.0000 | INHALATION_SPRAY | Freq: Two times a day (BID) | RESPIRATORY_TRACT | Status: DC
  Filled 2019-10-15 (×2): qty 8.8

## 2019-10-15 MED ORDER — DICYCLOMINE HCL 10 MG PO CAPS
10.0000 mg | ORAL_CAPSULE | Freq: Four times a day (QID) | ORAL | Status: DC
  Administered 2019-10-15 – 2019-10-16 (×5): 10 mg via ORAL
  Filled 2019-10-15 (×6): qty 1

## 2019-10-15 MED ORDER — DONEPEZIL HCL 10 MG PO TABS
10.0000 mg | ORAL_TABLET | Freq: Every day | ORAL | Status: DC
  Administered 2019-10-15: 10 mg via ORAL
  Filled 2019-10-15 (×2): qty 1

## 2019-10-15 MED ORDER — QUETIAPINE FUMARATE 100 MG PO TABS: 100.0000 mg | ORAL_TABLET | ORAL | Status: DC

## 2019-10-15 MED ORDER — HEPARIN SODIUM (PORCINE) 5000 UNIT/ML IJ SOLN
5000.0000 [IU] | Freq: Three times a day (TID) | INTRAMUSCULAR | Status: DC
  Administered 2019-10-15 – 2019-10-16 (×4): 5000 [IU] via SUBCUTANEOUS
  Filled 2019-10-15 (×4): qty 1

## 2019-10-15 MED ORDER — IPRATROPIUM-ALBUTEROL 0.5-2.5 (3) MG/3ML IN SOLN: 3.0000 mL | Freq: Four times a day (QID) | RESPIRATORY_TRACT | Status: DC | PRN

## 2019-10-15 MED ORDER — ALBUTEROL SULFATE (2.5 MG/3ML) 0.083% IN NEBU: 2.5000 mg | INHALATION_SOLUTION | RESPIRATORY_TRACT | Status: DC | PRN

## 2019-10-15 MED ORDER — MAGNESIUM HYDROXIDE 400 MG/5ML PO SUSP
30.0000 mL | Freq: Every evening | ORAL | Status: DC | PRN
  Filled 2019-10-15: qty 30

## 2019-10-15 MED ORDER — LORAZEPAM 0.5 MG PO TABS: 0.5000 mg | ORAL_TABLET | Freq: Every day | ORAL | Status: DC | PRN

## 2019-10-15 MED ORDER — HYDROCORTISONE ACETATE 25 MG RE SUPP
25.0000 mg | Freq: Two times a day (BID) | RECTAL | Status: DC | PRN
  Filled 2019-10-15: qty 1

## 2019-10-15 MED ORDER — POTASSIUM CHLORIDE CRYS ER 20 MEQ PO TBCR
20.0000 meq | EXTENDED_RELEASE_TABLET | Freq: Every day | ORAL | Status: DC
  Administered 2019-10-15 – 2019-10-16 (×2): 20 meq via ORAL
  Filled 2019-10-15 (×2): qty 1

## 2019-10-15 MED ORDER — PANTOPRAZOLE SODIUM 20 MG PO TBEC
20.0000 mg | DELAYED_RELEASE_TABLET | Freq: Every day | ORAL | Status: DC
  Administered 2019-10-16: 20 mg via ORAL
  Filled 2019-10-15: qty 1

## 2019-10-15 MED ORDER — FERROUS SULFATE 325 (65 FE) MG PO TABS
325.0000 mg | ORAL_TABLET | Freq: Every day | ORAL | Status: DC
  Administered 2019-10-16: 325 mg via ORAL
  Filled 2019-10-15: qty 1

## 2019-10-15 MED ORDER — ACETAMINOPHEN 325 MG PO TABS
650.0000 mg | ORAL_TABLET | Freq: Four times a day (QID) | ORAL | Status: DC | PRN
  Administered 2019-10-16: 650 mg via ORAL
  Filled 2019-10-15: qty 2

## 2019-10-15 NOTE — H&P (Signed)
TRH H&P   Patient Demographics:    Tranisha Tissue, is a 79 y.o. female  MRN: [id number]   DOB - 1940/08/02  Admit Date - 10/15/2019  Outpatient Primary MD for the patient is Ron Parker, MD  Referring MD/NP/PA: Dede Query   Patient coming from: [facility]  Chief Complaint  Patient presents with  . Chest Pain  . Dizziness      HPI:    Ahliyah Nienow  is a 79 y.o. female, with medical history significant for bipolar disorder, COPD, mild dementia, hypertension, and chronic venous stasis, mild dementia resents from [place] assisted living facility secondary to near syncope, patient report she was about to get out of her chair, she felt dizzy, lightheaded, some blurry vision, about to pass out, she did not pass out, no loss of consciousness, no fall, she does report generalized weakness and lethargy as well, she denies any focal deficits, slurred speech or facial droop, no urine or stool incontinence, reports some chest tightness, but report this is common for her usually due to anxiety, reports she had recent congestion, for which she was given prednisone, she denies cough, fever or chills. - in ED patient was noted to be orthostatic, systolic blood pressure dropped to 105 from 140, Significant for baseline creatinine around 1.05, otherwise no significant labs abnormalities, troponins were negative, EKG nonacute, treated hospitalist were consulted to admit    Review of systems:    In addition to the HPI above,  No Fever-chills, does report near syncope, lightheadedness and dizziness No Headache, No changes with Vision or hearing, No problems swallowing food or Liquids, No Chest pain, Cough or Shortness of Breath, No Abdominal pain, No Nausea or Vommitting, Bowel movements are regular, No Blood in stool or Urine, No dysuria, but she reports some polyuria No new  skin rashes or bruises, No new joints pains-aches,  No new weakness, tingling, numbness in any extremity, No recent weight gain or loss, No polyuria, polydypsia or polyphagia, No significant Mental Stressors.  A full 10 point Review of Systems was done, except as stated above, all other Review of Systems were negative.   With Past History of the following :    Past Medical History:  Diagnosis Date  . Arthritis   . Bipolar 1 disorder (HCC)   . Bronchitis   . Chronic back pain   . Constipation   . COPD (chronic obstructive pulmonary disease) (HCC)   . Dementia (HCC)   . Diabetes mellitus   . GERD (gastroesophageal reflux disease)   . Hiatal hernia   . Hypertension   . IBS (irritable bowel syndrome)       Past Surgical History:  Procedure Laterality Date  . gallbladder    . MANDIBLE SURGERY    . PARTIAL HYSTERECTOMY        Social History:     Social History   Tobacco  Use  . Smoking status: Former Smoker    Packs/day: 0.50    Years: 2.00    Pack years: 1.00    Types: Cigarettes    Quit date: 05/28/1959    Years since quitting: 60.4  . Smokeless tobacco: Former Neurosurgeon    Types: Snuff    Quit date: 05/28/1959  . Tobacco comment: Pt used snuff for 33 years  Substance Use Topics  . Alcohol use: No       Family History :     Family History  Problem Relation Age of Onset  . Emphysema Mother   . Cancer Other      Home Medications:   Prior to Admission medications   Medication Sig Start Date End Date Taking? Authorizing Provider  acetaminophen (TYLENOL) 325 MG tablet Take 650 mg by mouth 3 (three) times daily.   Yes [provider]  albuterol (PROVENTIL HFA;VENTOLIN HFA) 108 (90 BASE) MCG/ACT inhaler Inhale 2 puffs into the lungs every 4 (four) hours as needed for wheezing or shortness of breath.   Yes [provider]  alum & mag hydroxide-simeth (GERI-LANTA) 200-200-20 MG/5ML suspension Take 30 mLs by mouth 4 (four) times daily as needed for  indigestion or heartburn.    Yes [provider]  amLODipine (NORVASC) 5 MG tablet Take 5 mg by mouth 2 (two) times daily.    Yes [provider]  antiseptic oral rinse (BIOTENE) LIQD 15 mLs by Mouth Rinse route at bedtime.   Yes [provider]  atorvastatin (LIPITOR) 40 MG tablet Take 40 mg by mouth daily.    Yes [provider]  bisacodyl (DULCOLAX) 10 MG suppository Place 10 mg rectally 3 (three) times daily as needed for moderate constipation.   Yes [provider]  budesonide-formoterol (SYMBICORT) 160-4.5 MCG/ACT inhaler Inhale 2 puffs into the lungs 2 (two) times daily.    Yes [provider]  Cholecalciferol (VITAMIN D) 50 MCG (2000 UT) tablet Take 2,000 Units by mouth daily.    Yes [provider]  clopidogrel (PLAVIX) 75 MG tablet Take 75 mg by mouth daily.    Yes [provider]  dicyclomine (BENTYL) 10 MG capsule Take 10 mg by mouth 4 (four) times daily.   Yes [provider]  donepezil (ARICEPT) 10 MG tablet Take 10 mg by mouth at bedtime.   Yes [provider]  famotidine (PEPCID) 20 MG tablet Take 20 mg by mouth 2 (two) times daily.   Yes [provider]  ferrous sulfate 325 (65 FE) MG tablet Take 325 mg by mouth daily with breakfast.   Yes [provider]  fluticasone (FLONASE) 50 MCG/ACT nasal spray Place 2 sprays into both nostrils daily.    Yes [provider]  guaiFENesin (ROBITUSSIN) 100 MG/5ML liquid Take 200 mg by mouth every 6 (six) hours as needed for cough.   Yes [provider]  guaiFENesin-dextromethorphan (ROBITUSSIN DM) 100-10 MG/5ML syrup Take 5 mLs by mouth every 4 (four) hours as needed for cough. 03/04/17  Yes [name], MD  hydrocortisone (ANUSOL-HC) 25 MG suppository Place 1 suppository (25 mg total) rectally 2 (two) times daily. Patient taking differently: Place 25 mg rectally 2 (two) times daily as needed for hemorrhoids.  04/30/19   Yes Kathlen Mody, MD  INCRUSE ELLIPTA 62.5 MCG/INH AEPB Inhale 1 puff into the lungs daily.  04/17/19  Yes [provider]  ipratropium-albuterol (DUONEB) 0.5-2.5 (3) MG/3ML SOLN Take 3 mLs by nebulization every 6 (six) hours as  needed. Patient taking differently: Take 3 mLs by nebulization 4 (four) times daily.  04/21/18  Yes Dana Allan I, MD  lamoTRIgine (LAMICTAL) 200 MG tablet Take 200 mg by mouth 2 (two) times daily.   Yes [provider]  lidocaine (ASPERCREME W/LIDOCAINE) 4 % cream Apply 1 application topically 3 (three) times daily as needed (for pain).    Yes [provider]  LORazepam (ATIVAN) 0.5 MG tablet Take 0.5 mg by mouth daily as needed for anxiety. 08/19/19  Yes [provider]  LORazepam (ATIVAN) 1 MG tablet Take 1 tablet (1 mg total) by mouth every 12 (twelve) hours. Patient taking differently: Take 1 mg by mouth 2 (two) times daily.  03/04/17  Yes Bonnielee Haff, MD  losartan (COZAAR) 100 MG tablet Take 100 mg by mouth daily.   Yes [provider]  magnesium hydroxide (MILK OF MAGNESIA) 400 MG/5ML suspension Take 30 mLs by mouth at bedtime as needed for mild constipation.   Yes [provider]  magnesium oxide (MAG-OX) 400 (241.3 Mg) MG tablet Take 400 mg by mouth daily.   Yes [provider]  montelukast (SINGULAIR) 10 MG tablet Take 10 mg by mouth at bedtime.   Yes [provider]  nystatin (NYSTATIN) powder Apply 1 Bottle topically 2 (two) times daily as needed (itchy rash).   Yes [provider]  pantoprazole (PROTONIX) 20 MG tablet Take 20 mg by mouth daily. 04/17/19  Yes [provider]  polyethylene glycol (MIRALAX / GLYCOLAX) 17 g packet Take 17 g by mouth 2 (two) times daily. Patient taking differently: Take 17 g by mouth daily.  04/30/19  Yes Hosie Poisson, MD  potassium chloride SA (KLOR-CON) 20 MEQ tablet Take 20 mEq by mouth daily. 03/15/19  Yes [provider]    predniSONE (DELTASONE) 5 MG tablet Take 5 mg by mouth daily. For 7 days, end date 10-18-19 10/11/19  Yes [provider]  QUEtiapine (SEROQUEL) 100 MG tablet Take 100-150 mg by mouth See admin instructions. Taking 1 tablet (100mg ) in the morning and 1 & 1/2 tablet (150mg ) every evening   Yes [provider]  senna-docusate (SENOKOT-S) 8.6-50 MG per tablet Take 1 tablet by mouth 2 (two) times daily.   Yes [provider]  torsemide (DEMADEX) 100 MG tablet Take 100 mg by mouth daily. 04/05/19  Yes [provider]  docusate sodium (COLACE) 100 MG capsule Take 1 capsule (100 mg total) by mouth daily. Patient not taking: Reported on 10/15/2019 05/01/19   Hosie Poisson, MD  tiotropium (SPIRIVA HANDIHALER) 18 MCG inhalation capsule Place 1 capsule (18 mcg total) into inhaler and inhale daily. 04/21/18 04/21/19  Bonnell Public, MD     Allergies:     Allergies  Allergen Reactions  . Aminoglycosides Other (See Comments)    Reaction:  Unknown   . Aspirin Other (See Comments)    Reaction:  Unknown   . Codeine Other (See Comments)    Reaction:  Unknown   . Ivp Dye [Iodinated Diagnostic Agents] Other (See Comments)    Reaction:  Unknown   . Morphine And Related Other (See Comments)    Reaction:  Unknown   . Neomycin Other (See Comments)    Reaction:  Unknown   . Penicillins Rash and Other (See Comments)    Has patient had a PCN reaction causing immediate rash, facial/tongue/throat swelling, SOB or lightheadedness with hypotension: No Has patient had a PCN reaction causing severe rash involving mucus membranes or skin  necrosis: No Has patient had a PCN reaction that required hospitalization No Has patient had a PCN reaction occurring within the last 10 years: No If all of the above answers are "NO", then may proceed with Cephalosporin use.  Marland Kitchen. Phenothiazines Other (See Comments)    Reaction:  Unknown   . Promethazine Other (See Comments)    Reaction:  Unknown    . Sulfa Antibiotics Other (See Comments)    Reaction:  Unknown   . Tetracyclines & Related Other (See Comments)    Reaction: Unknown      Physical Exam:   Vitals  Blood pressure (!) 118/55, pulse (!) 58, temperature 98.1 F (36.7 C), temperature source Oral, resp. rate 13, height 5\' 3"  (1.6 m), weight 84 kg, SpO2 94 %.   1. General well developed female lying in bed in NAD,    2. Normal affect and insight, Not Suicidal or Homicidal, Awake Alert, Oriented X 3.  3. No F.N deficits, ALL C.Nerves Intact, Strength 5/5 all 4 extremities, Sensation intact all 4 extremities, Plantars down going.  4. Ears and Eyes appear Normal, Conjunctivae clear, PERRLA. Moist Oral Mucosa.  5. Supple Neck, No JVD, No cervical lymphadenopathy appriciated, No Carotid Bruits.  6. Symmetrical Chest wall movement, Good air movement bilaterally, CTAB.  7. RRR, No Gallops, Rubs or Murmurs, No Parasternal Heave.  She has +1 lower extremity edema  8. Positive Bowel Sounds, Abdomen Soft, No tenderness, No organomegaly appriciated,No rebound -guarding or rigidity.  9.  No Cyanosis, delayed  Skin Turgor, No Skin Rash or Bruise.  10. Good muscle tone,  joints appear normal , no effusions, Normal ROM.  11. No Palpable Lymph Nodes in Neck or Axillae     Data Review:    CBC Recent Labs  Lab 10/15/19 1134  WBC 9.1  HGB 12.1  HCT 38.5  PLT 245  MCV 94.8  MCH 29.8  MCHC 31.4  RDW 14.6   ------------------------------------------------------------------------------------------------------------------  Chemistries  Recent Labs  Lab 10/15/19 1134  NA 139  K 4.2  CL 98  CO2 26  GLUCOSE 128*  BUN 17  CREATININE 1.05*  CALCIUM 9.2  AST 22  ALT 17  ALKPHOS 110  BILITOT 0.9   ------------------------------------------------------------------------------------------------------------------ estimated creatinine clearance is 44.6 mL/min (A) (by C-G formula based on SCr of 1.05 mg/dL  (H)). ------------------------------------------------------------------------------------------------------------------ No results for input(s): TSH, T4TOTAL, T3FREE, THYROIDAB in the last 72 hours.  Invalid input(s): FREET3  Coagulation profile No results for input(s): INR, PROTIME in the last 168 hours. ------------------------------------------------------------------------------------------------------------------- No results for input(s): DDIMER in the last 72 hours. -------------------------------------------------------------------------------------------------------------------  Cardiac Enzymes No results for input(s): CKMB, TROPONINI, MYOGLOBIN in the last 168 hours.  Invalid input(s): CK ------------------------------------------------------------------------------------------------------------------    Component Value Date/Time   BNP 75.6 04/17/2018 1754     ---------------------------------------------------------------------------------------------------------------  Urinalysis    Component Value Date/Time   COLORURINE COLORLESS (A) 03/30/2017 1813   APPEARANCEUR CLEAR 03/30/2017 1813   LABSPEC 1.003 (L) 03/30/2017 1813   PHURINE 7.0 03/30/2017 1813   GLUCOSEU NEGATIVE 03/30/2017 1813   HGBUR NEGATIVE 03/30/2017 1813   BILIRUBINUR NEGATIVE 03/30/2017 1813   KETONESUR NEGATIVE 03/30/2017 1813   PROTEINUR NEGATIVE 03/30/2017 1813   UROBILINOGEN 0.2 09/19/2014 1633   NITRITE NEGATIVE 03/30/2017 1813   LEUKOCYTESUR NEGATIVE 03/30/2017 1813    ----------------------------------------------------------------------------------------------------------------   Imaging Results:    DG Chest Portable 1 View  Result Date: 10/15/2019 CLINICAL DATA:  Chest tightness. EXAM: PORTABLE CHEST 1 VIEW COMPARISON:  04/17/2018. FINDINGS: Patient is  rotated to the right. Neurostimulator noted in stable position. Heart size normal. No focal infiltrate. No pleural effusion or  pneumothorax. IMPRESSION: No acute cardiopulmonary disease. Stable exam from prior study of 04/17/2018. Electronically Signed   By: Maisie Fus  Register   On: 10/15/2019 11:58    My personal review of EKG: Rhythm NSR, Rate  68 /min,   Assessment & Plan:    Active Problems:   HTN (hypertension)   Chronic diastolic CHF (congestive heart failure) (HCC)   Dementia with behavioral disturbance (HCC)   Bipolar 1 disorder (HCC)   COPD with chronic bronchitis (HCC)   Near syncope   Near syncope -This is secondary to orthostasis, most likely due to diuretics, and antihypertensive regimen, she was clearly orthostatic in ED. -I will hold her 100 mg oral torsemide, amlodipine, and losartan, will keep on gentle hydration for total of 250 cc over night, will monitor on telemetry to ensure there is no bradycardia or pauses, and I will check her 2D echo given she didn't have any echo in records. -Consult PT. -Likely she'll be able to be discharged home tomorrow if orthostasis has resolved and has negative work-up.  Hypertension -We'll hold amlodipine and losartan given she is orthostatic  Dementia -Appears to be mild, continue with Aricept  Bipolar disorder -Lamictal, Seroquel and as needed Ativan  Chronic diastolic CHF -Appears to be compensated, monitor closely as holding her torsemide and she is on IV fluids  DVT Prophylaxis Heparin   AM Labs Ordered, also please review Full Orders  Family Communication: Admission, patients condition and plan of care including tests being ordered have been discussed with the patient and daughter via phone who indicate understanding and agree with the plan and Code Status.  Code Status DNR/confirmed by patient, and daughter, and she has DNR form  Likely DC to back to [facility]  Condition GUARDED    Consults called: None  Admission status: Observation  Time spent in minutes : 50 minutes   Huey Bienenstock M.D on 10/15/2019 at 3:31 PM  Between  7am to 7pm - Pager - [phone]. After 7pm go to [email or web address] - password [facility]  [facility] - Office  [phone]

## 2019-10-15 NOTE — ED Provider Notes (Signed)
Medical screening examination/treatment/procedure(s) were conducted as a shared visit with non-physician practitioner(s) and myself.  I personally evaluated the patient during the encounter.     Patient seen by me along with physician assistant patient.  Patient is a DNR.  Patient felt as if she was going to pass out to nursing home when she was walking.  She had to get assistance.  Felt like things were closing in and she was going to pass out but she did not.  Here patient had orthostatic hypotension and also got very dizzy.  Does not really complain of room spinning.  Lab work-up white blood cell count normal.  Electrolytes without significant abnormalities.  Patient has a little bit of renal insufficiency.  Her GFR was about 50 with a creatinine of 1.05.  Troponin was normal.  Patient did have a little bit of intermittent chest pain.  Chest x-ray was negative.  Patient does not appear clinically dehydrated.  On neuro exam everything seems to be working well here.  Patient does have a history of Alzheimer's disease but you are able to hold a conversation with her.  She states that she did not have the Covid vaccine because her daughter was against it.  Hospitalist will see patient for admission.   Vanetta Mulders, MD 10/15/19 1455

## 2019-10-15 NOTE — Progress Notes (Signed)
Medication not available

## 2019-10-15 NOTE — ED Provider Notes (Signed)
[facility] EMERGENCY DEPARTMENT Provider Note   CSN: [id number] Arrival date & time: 10/15/19  0762     History Chief Complaint  Patient presents with  . Chest Pain  . Dizziness    Cindy Padilla is a 79 y.o. female with pertinent past medical history of bipolar 1, COPD, dementia, diabetes, hypertension that presents to the emergency room via EMS today for near synco that occurred this morning.  Patient states that she was about to get to her chair and felt like she was going to pass out, states that she had blurry vision.  Did not pass out.  Patient states that after she sat in the chair she felt better, however still feels dizzy and lethargic.  Also complaining of some chest tightness which she normally gets due to anxiety.  Denies any pleuritic pain, is not exertional, does not radiate anywhere.  Denies any palpitations or shortness of breath.  Denies any nausea, vomiting, diarrhea.  States that the feeling of syncope happened to her previously, however it has not happened this bad where she has had to sit down from it.  States that she just started taking prednisone 3 days ago in addition to all of her other medications.  States that she takes multiple pills daily.  States that she has anxiety and takes Ativan normally daily.  Did take 1 this morning when she started having chest tightness, did help.  Denies any fevers, chills, nausea, vomiting, abdominal pain, back pain, headache, neck pain, myalgias.  Patient currently states that she feels weak and slightly out of it.  HPI     Past Medical History:  Diagnosis Date  . Arthritis   . Bipolar 1 disorder (HCC)   . Bronchitis   . Chronic back pain   . Constipation   . COPD (chronic obstructive pulmonary disease) (HCC)   . Dementia (HCC)   . Diabetes mellitus   . GERD (gastroesophageal reflux disease)   . Hiatal hernia   . Hypertension   . IBS (irritable bowel syndrome)     Patient Active Problem List    Diagnosis Date Noted  . 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  . BRBPR (bright red blood per rectum) 04/28/2019  . Acute exacerbation of chronic obstructive pulmonary disease (COPD) (HCC) 02/26/2017  . COPD with acute exacerbation (HCC) 02/24/2017  . Bipolar 1 disorder (HCC) 02/24/2017  . Normocytic anemia 02/24/2017  . Morbid obesity (HCC) 04/14/2015  . Upper airway cough syndrome 04/14/2015  . Ventral hernia 04/24/2014  . Constipation 04/24/2014  . Hemorrhoids 04/22/2014  . Abnormality of gait 09/30/2013  . 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 Chronic diastolic CHF (congestive heart failure) (HCC) 07/27/2013  . Dementia with behavioral disturbance (HCC) 07/27/2013  . Weakness 02/09/2013  . Hyponatremia 02/09/2013  . Shortness of breath 02/09/2013  . HTN (hypertension) 02/09/2013  . Other and unspecified hyperlipidemia 02/09/2013    Past Surgical History:  Procedure Laterality Date  . gallbladder    . MANDIBLE SURGERY    . PARTIAL HYSTERECTOMY       OB History   No obstetric history on file.     Family History  Problem Relation Age of Onset  . Emphysema Mother   . Cancer Other     Social History   Tobacco Use  . Smoking status: Former Smoker    Packs/day: 0.50    Years: 2.00    Pack years: 1.00    Types: Cigarettes  Quit date: 05/28/1959    Years since quitting: 60.4  . Smokeless tobacco: Former Systems developer    Types: Snuff    Quit date: 05/28/1959  . Tobacco comment: Pt used snuff for 33 years  Substance Use Topics  . Alcohol use: No  . Drug use: No    Home Medications Prior to Admission medications   Medication Sig Start Date End Date Taking? Authorizing Provider  acetaminophen (TYLENOL) 325 MG tablet Take 650 mg by mouth 3 (three) times daily.   Yes [provider]  albuterol (PROVENTIL HFA;VENTOLIN HFA) 108 (90 BASE) MCG/ACT inhaler Inhale 2 puffs into the lungs every 4 (four) hours as needed for wheezing or  shortness of breath.   Yes [provider]  alum & mag hydroxide-simeth (GERI-LANTA) 200-200-20 MG/5ML suspension Take 30 mLs by mouth 4 (four) times daily as needed for indigestion or heartburn.    Yes [provider]  amLODipine (NORVASC) 5 MG tablet Take 5 mg by mouth 2 (two) times daily.    Yes [provider]  antiseptic oral rinse (BIOTENE) LIQD 15 mLs by Mouth Rinse route at bedtime.   Yes [provider]  atorvastatin (LIPITOR) 40 MG tablet Take 40 mg by mouth daily.    Yes [provider]  bisacodyl (DULCOLAX) 10 MG suppository Place 10 mg rectally 3 (three) times daily as needed for moderate constipation.   Yes [provider]  budesonide-formoterol (SYMBICORT) 160-4.5 MCG/ACT inhaler Inhale 2 puffs into the lungs 2 (two) times daily.    Yes [provider]  Cholecalciferol (VITAMIN D) 50 MCG (2000 UT) tablet Take 2,000 Units by mouth daily.    Yes [provider]  clopidogrel (PLAVIX) 75 MG tablet Take 75 mg by mouth daily.    Yes [provider]  dicyclomine (BENTYL) 10 MG capsule Take 10 mg by mouth 4 (four) times daily.   Yes [provider]  donepezil (ARICEPT) 10 MG tablet Take 10 mg by mouth at bedtime.   Yes [provider]  famotidine (PEPCID) 20 MG tablet Take 20 mg by mouth 2 (two) times daily.   Yes [provider]  ferrous sulfate 325 (65 FE) MG tablet Take 325 mg by mouth daily with breakfast.   Yes [provider]  fluticasone (FLONASE) 50 MCG/ACT nasal spray Place 2 sprays into both nostrils daily.    Yes [provider]  guaiFENesin (ROBITUSSIN) 100 MG/5ML liquid Take 200 mg by mouth every 6 (six) hours as needed for cough.   Yes [provider]  guaiFENesin-dextromethorphan (ROBITUSSIN DM) 100-10 MG/5ML syrup Take 5 mLs by mouth every 4 (four) hours as needed for cough. 03/04/17  Yes Bonnielee Haff, MD  hydrocortisone (ANUSOL-HC) 25 MG  suppository Place 1 suppository (25 mg total) rectally 2 (two) times daily. Patient taking differently: Place 25 mg rectally 2 (two) times daily as needed for hemorrhoids.  04/30/19  Yes Hosie Poisson, MD  INCRUSE ELLIPTA 62.5 MCG/INH AEPB Inhale 1 puff into the lungs daily.  04/17/19  Yes [provider]  ipratropium-albuterol (DUONEB) 0.5-2.5 (3) MG/3ML SOLN Take 3 mLs by nebulization every 6 (six) hours as needed. Patient taking differently: Take 3 mLs by nebulization 4 (four) times daily.  04/21/18  Yes Dana Allan I, MD  lamoTRIgine (LAMICTAL) 200 MG tablet Take 200 mg by mouth 2 (two) times daily.   Yes [provider]  lidocaine (ASPERCREME W/LIDOCAINE) 4 % cream Apply 1 application topically 3 (three) times daily as needed (for  pain).    Yes [provider]  LORazepam (ATIVAN) 0.5 MG tablet Take 0.5 mg by mouth daily as needed for anxiety. 08/19/19  Yes [provider]  LORazepam (ATIVAN) 1 MG tablet Take 1 tablet (1 mg total) by mouth every 12 (twelve) hours. Patient taking differently: Take 1 mg by mouth 2 (two) times daily.  03/04/17  Yes [name], MD  losartan (COZAAR) 100 MG tablet Take 100 mg by mouth daily.   Yes [provider]  magnesium hydroxide (MILK OF MAGNESIA) 400 MG/5ML suspension Take 30 mLs by mouth at bedtime as needed for mild constipation.   Yes [provider]  magnesium oxide (MAG-OX) 400 (241.3 Mg) MG tablet Take 400 mg by mouth daily.   Yes [provider]  montelukast (SINGULAIR) 10 MG tablet Take 10 mg by mouth at bedtime.   Yes [provider]  nystatin (NYSTATIN) powder Apply 1 Bottle topically 2 (two) times daily as needed (itchy rash).   Yes [provider]  pantoprazole (PROTONIX) 20 MG tablet Take 20 mg by mouth daily. 04/17/19  Yes [provider]  polyethylene glycol (MIRALAX / GLYCOLAX) 17 g packet Take 17 g by mouth 2 (two) times daily. Patient taking  differently: Take 17 g by mouth daily.  04/30/19  Yes Kathlen Mody, MD  potassium chloride SA (KLOR-CON) 20 MEQ tablet Take 20 mEq by mouth daily. 03/15/19  Yes [provider]  predniSONE (DELTASONE) 5 MG tablet Take 5 mg by mouth daily. For 7 days, end date 10-18-19 10/11/19  Yes [provider]  QUEtiapine (SEROQUEL) 100 MG tablet Take 100-150 mg by mouth See admin instructions. Taking 1 tablet (100mg ) in the morning and 1 & 1/2 tablet (150mg ) every evening   Yes [provider]  senna-docusate (SENOKOT-S) 8.6-50 MG per tablet Take 1 tablet by mouth 2 (two) times daily.   Yes [provider]  torsemide (DEMADEX) 100 MG tablet Take 100 mg by mouth daily. 04/05/19  Yes [provider]  docusate sodium (COLACE) 100 MG capsule Take 1 capsule (100 mg total) by mouth daily. Patient not taking: Reported on 10/15/2019 05/01/19   10/17/2019, MD  tiotropium (SPIRIVA HANDIHALER) 18 MCG inhalation capsule Place 1 capsule (18 mcg total) into inhaler and inhale daily. 04/21/18 04/21/19  04/23/18 I, MD    Allergies    Aminoglycosides, Aspirin, Codeine, Ivp dye [iodinated diagnostic agents], Morphine and related, Neomycin, Penicillins, Phenothiazines, Promethazine, Sulfa antibiotics, and Tetracyclines & related  Review of Systems   Review of Systems  Constitutional: Negative for chills, diaphoresis, fatigue and fever.  HENT: Negative for congestion, sore throat and trouble swallowing.   Eyes: Negative for pain and visual disturbance.  Respiratory: Negative for cough, shortness of breath and wheezing.   Cardiovascular: Negative for chest pain, palpitations and leg swelling.  Gastrointestinal: Negative for abdominal distention, abdominal pain, diarrhea, nausea and vomiting.  Genitourinary: Negative for difficulty urinating.  Musculoskeletal: Negative for back pain, neck pain and neck stiffness.  Skin: Negative for pallor.  Neurological: Positive for  dizziness, syncope and light-headedness. Negative for speech difficulty, weakness and headaches.  Psychiatric/Behavioral: Negative for confusion.    Physical Exam Updated Vital Signs BP (!) 118/55   Pulse (!) 58   Temp 98.1 F (36.7 C) (Oral)   Resp 13   Ht 5\' 3"  (1.6 m)   Wt 84 kg   SpO2 94%   BMI 32.80 kg/m   Physical Exam Constitutional:  General: She is not in acute distress.    Appearance: Normal appearance. She is not ill-appearing, toxic-appearing or diaphoretic.  HENT:     Mouth/Throat:     Mouth: Mucous membranes are moist.     Pharynx: Oropharynx is clear.  Eyes:     General: No scleral icterus.    Extraocular Movements: Extraocular movements intact.     Pupils: Pupils are equal, round, and reactive to light.  Cardiovascular:     Rate and Rhythm: Normal rate and regular rhythm.     Pulses: Normal pulses.     Heart sounds: Normal heart sounds.  Pulmonary:     Effort: Pulmonary effort is normal. No respiratory distress.     Breath sounds: Normal breath sounds. No stridor. No wheezing, rhonchi or rales.  Chest:     Chest wall: No tenderness.  Abdominal:     General: Abdomen is flat. There is no distension.     Palpations: Abdomen is soft.     Tenderness: There is no abdominal tenderness. There is no guarding or rebound.  Musculoskeletal:        General: No swelling or tenderness. Normal range of motion.     Cervical back: Normal range of motion and neck supple. No rigidity.     Right lower leg: Edema (Bilateral leg erythema and 1+ pitting edema from foot into mid calf,  patient is in compression stockings.  Patient states that he she is being treated by this up with her PCP.Marland Kitchen. Normal strength, normal sensation.  Distal pulses 2+) present.     Left lower leg: Edema (Same as right) present.  Skin:    General: Skin is warm and dry.     Capillary Refill: Capillary refill takes less than 2 seconds.     Coloration: Skin is not pale.  Neurological:     General:  No focal deficit present.     Mental Status: She is alert and oriented to person, place, and time.     Comments: Alert. Clear speech. No facial droop. CNIII-XII grossly intact. Bilateral upper and lower extremities' sensation grossly intact. 5/5 symmetric strength with grip strength and with plantar and dorsi flexion bilaterally. Patellar DTRs are 2+ and symmetric . Normal finger to nose bilaterally. Negative pronator drift. Negative Romberg sign.   Psychiatric:        Mood and Affect: Mood normal.        Behavior: Behavior normal.     ED Results / Procedures / Treatments   Labs (all labs ordered are listed, but only abnormal results are displayed) Labs Reviewed  COMPREHENSIVE METABOLIC PANEL - Abnormal; Notable for the following components:      Result Value   Glucose, Bld 128 (*)    Creatinine, Ser 1.05 (*)    GFR calc non Af Amer 50 (*)    GFR calc Af Amer 58 (*)    All other components within normal limits  CBC  TROPONIN I (HIGH SENSITIVITY)  TROPONIN I (HIGH SENSITIVITY)    EKG None  Radiology DG Chest Portable 1 View  Result Date: 10/15/2019 CLINICAL DATA:  Chest tightness. EXAM: PORTABLE CHEST 1 VIEW COMPARISON:  04/17/2018. FINDINGS: Patient is rotated to the right. Neurostimulator noted in stable position. Heart size normal. No focal infiltrate. No pleural effusion or pneumothorax. IMPRESSION: No acute cardiopulmonary disease. Stable exam from prior study of 04/17/2018. Electronically Signed   By: Maisie Fushomas  Register   On: 10/15/2019 11:58    Procedures Procedures (including critical care time)  Medications Ordered in ED Medications - No data to display  ED Course  I have reviewed the triage vital signs and the nursing notes.  Pertinent labs & imaging results that were available during my care of the patient were reviewed by me and considered in my medical decision making (see chart for details).    MDM Rules/Calculators/A&P                     EMMILIA SOWDER is a 79 y.o. female with pertinent past medical history of bipolar 1, COPD, dementia, diabetes, hypertension that presents to the emergency room via EMS today for near synco that occurred this morning.  Presyncope work-up to include EKG, CBC, orthostatic vitals.  We will also do full ACS work-up due to chest tightness. Normal neuro exam, did not access gait due to dizziness.  Denies room spinning sensation.  .Differential diagnoses considered include near syncope due to vasovagal, orthostatic, cardiogenic causes.  Patient does not look dry at this time, states that she drinks a lot of water daily.  Will hold off on fluids at this time since patient does have CHF.  Patient with severe orthostatic hypotension, patient dropped 40 systolic on standing.  Patient states that she felt extremely dizzy while standing.  Even feels dizzy while sitting in the bed.  Will consult hospitalist.  Think this is most likely due to polypharmacy.  ECG interpreted by me demonstrated no signs of ischemia.  CXR interpreted by me demonstrated unchanged from last time.  Labs demonstrated normal CBC and CMP.   3:01 PM discussed case with hospitalist, Dr. Seth Bake, who agrees to accept care of patient.  The patient appears reasonably stabilized for admission considering the current resources, flow, and capabilities available in the ED at this time, and I doubt any other [facility] requiring further screening and/or treatment in the ED prior to admission.  .I discussed this case with my attending physician who cosigned this note including patient's presenting symptoms, physical exam, and planned diagnostics and interventions. Attending physician stated agreement with plan or made changes to plan which were implemented.   Attending physician assessed patient at bedside.  Final Clinical Impression(s) / ED Diagnoses Final diagnoses:  Orthostatic hypotension    Rx / DC Orders ED Discharge Orders    None         Farrel Gordon, PA-C 10/15/19 1538    Vanetta Mulders, MD 11/16/19 2050

## 2019-10-15 NOTE — ED Triage Notes (Signed)
From Cindy Padilla via ems;   called out for dizziness, lethargy, chest felt "funny"; pt normally ambulatory w/ walker; normally a and o; slurred speech noted w/ ems; stroke screen negative with ems; denies nausea; hx HTN, alzheimer's, asthma  HR 77 normal sinus 96% RA  RR 1 137/57 CBG 214

## 2019-10-15 NOTE — ED Notes (Signed)
Pt voice c/o of dizziness while standing during orthostatics, nurse notified

## 2019-10-16 ENCOUNTER — Observation Stay (HOSPITAL_BASED_OUTPATIENT_CLINIC_OR_DEPARTMENT_OTHER): Payer: Medicare Other

## 2019-10-16 ENCOUNTER — Other Ambulatory Visit (HOSPITAL_COMMUNITY): Payer: Medicare Other

## 2019-10-16 ENCOUNTER — Encounter (HOSPITAL_COMMUNITY): Payer: Self-pay | Admitting: Internal Medicine

## 2019-10-16 DIAGNOSIS — R55 Syncope and collapse: Secondary | ICD-10-CM | POA: Diagnosis not present

## 2019-10-16 DIAGNOSIS — I10 Essential (primary) hypertension: Secondary | ICD-10-CM | POA: Diagnosis not present

## 2019-10-16 DIAGNOSIS — I361 Nonrheumatic tricuspid (valve) insufficiency: Secondary | ICD-10-CM

## 2019-10-16 DIAGNOSIS — F0391 Unspecified dementia with behavioral disturbance: Secondary | ICD-10-CM | POA: Diagnosis not present

## 2019-10-16 DIAGNOSIS — I350 Nonrheumatic aortic (valve) stenosis: Secondary | ICD-10-CM

## 2019-10-16 DIAGNOSIS — I351 Nonrheumatic aortic (valve) insufficiency: Secondary | ICD-10-CM | POA: Diagnosis not present

## 2019-10-16 DIAGNOSIS — I5032 Chronic diastolic (congestive) heart failure: Secondary | ICD-10-CM | POA: Diagnosis not present

## 2019-10-16 LAB — BASIC METABOLIC PANEL
Anion gap: 11 (ref 5–15)
BUN: 23 mg/dL (ref 8–23)
CO2: 27 mmol/L (ref 22–32)
Calcium: 9 mg/dL (ref 8.9–10.3)
Chloride: 99 mmol/L (ref 98–111)
Creatinine, Ser: 0.98 mg/dL (ref 0.44–1.00)
GFR calc Af Amer: >60 mL/min (ref >60)
GFR calc non Af Amer: 55 mL/min — ABNORMAL LOW (ref >60)
Glucose, Bld: 112 mg/dL — ABNORMAL HIGH (ref 70–99)
Potassium: 3.5 mmol/L (ref 3.5–5.1)
Sodium: 137 mmol/L (ref 135–145)

## 2019-10-16 LAB — ECHOCARDIOGRAM COMPLETE
Height: 63 in
Weight: 2962.98 oz

## 2019-10-16 LAB — CBC
HCT: 36.9 % (ref 36.0–46.0)
Hemoglobin: 11.9 g/dL — ABNORMAL LOW (ref 12.0–15.0)
MCH: 29.5 pg (ref 26.0–34.0)
MCHC: 32.2 g/dL (ref 30.0–36.0)
MCV: 91.3 fL (ref 80.0–100.0)
Platelets: 283 10*3/uL (ref 150–400)
RBC: 4.04 MIL/uL (ref 3.87–5.11)
RDW: 14.7 % (ref 11.5–15.5)
WBC: 9.5 10*3/uL (ref 4.0–10.5)
nRBC: 0 % (ref 0.0–0.2)

## 2019-10-16 MED ORDER — PREDNISONE 10 MG PO TABS
10.0000 mg | ORAL_TABLET | Freq: Every day | ORAL | 0 refills | Status: AC
Start: 2019-10-17 — End: 2019-10-20

## 2019-10-16 MED ORDER — LORAZEPAM 0.5 MG PO TABS: 0.5000 mg | ORAL_TABLET | Freq: Every day | ORAL | 0 refills | Status: DC | PRN

## 2019-10-16 MED ORDER — PERFLUTREN LIPID MICROSPHERE
1.0000 mL | INTRAVENOUS | Status: AC | PRN
  Administered 2019-10-16: 2 mL via INTRAVENOUS
  Filled 2019-10-16: qty 10

## 2019-10-16 MED ORDER — PREDNISONE 20 MG PO TABS
20.0000 mg | ORAL_TABLET | Freq: Every day | ORAL | Status: DC
  Administered 2019-10-16: 20 mg via ORAL
  Filled 2019-10-16: qty 1

## 2019-10-16 MED ORDER — LORATADINE 10 MG PO TABS
10.0000 mg | ORAL_TABLET | Freq: Every day | ORAL | Status: DC
  Administered 2019-10-16: 10 mg via ORAL
  Filled 2019-10-16: qty 1

## 2019-10-16 MED ORDER — LORAZEPAM 1 MG PO TABS: 1.0000 mg | ORAL_TABLET | Freq: Two times a day (BID) | ORAL | 0 refills | Status: DC

## 2019-10-16 MED ORDER — TORSEMIDE 20 MG PO TABS: 60.0000 mg | ORAL_TABLET | Freq: Every day | ORAL | 0 refills | Status: DC

## 2019-10-16 NOTE — TOC Transition Note (Signed)
Transition of Care [facility]) - CM/SW Discharge Note   Patient Details  Name: Cindy Padilla MRN: [id number] Date of Birth: 07-11-1940  Transition of Care [facility]) CM/SW Contact:  Luiz Blare Phone Number: 10/16/2019, 1:53 PM   Clinical Narrative:    RN to call report prior to discharge [phone])  Patient will DC to: [name] Anticipated DC date: 10/16/19 Family notified: Darl Pikes, daughter Transport by: Sharin Mons   Per MD patient ready for DC to [facility]. RN, patient, patient's family, and facility notified of DC. Discharge Summary and FL2 sent to facility.  DC packet on chart. Ambulance transport requested for patient.   CSW will sign off for now as social work intervention is no longer needed. Please consult [place] again if new needs arise.    Final next level of care: Assisted Living Barriers to Discharge: No Barriers Identified   Patient Goals and CMS Choice   CMS Medicare.gov Compare Post Acute Care list provided to:: Patient Choice offered to / list presented to : Patient  Discharge Placement              Patient chooses bed at: [facility] Patient to be transferred to facility by: [facility] Name of family member notified: Darl Pikes Patient and family notified of of transfer: 10/16/19  Discharge Plan and Services                                     Social Determinants of Health (SDOH) Interventions     Readmission Risk Interventions No flowsheet data found.

## 2019-10-16 NOTE — Evaluation (Signed)
Physical Therapy Evaluation Patient Details Name: Cindy Padilla MRN: [id number] DOB: [birth year] Today's Date: 10/16/2019   History of Present Illness  79 y.o. female, with medical history significant for bipolar disorder, COPD, mild dementia, hypertension, chronic venous stasis, and mild dementia. She presented from [facility] assisted living facility secondary to near syncope.    Clinical Impression  PT eval complete. Pt required supervision transfers and ambulation 150' with RW. Steady gait noted with RW. No LOB. No reports of dizziness. Pt reports she is back to her baseline. No further PT intervention indicated. PT signing off.    Follow Up Recommendations No PT follow up    Equipment Recommendations  None recommended by PT    Recommendations for Other Services       Precautions / Restrictions Precautions Precautions: Fall Restrictions Weight Bearing Restrictions: No      Mobility  Bed Mobility Overal bed mobility: Modified Independent                Transfers Overall transfer level: Needs assistance Equipment used: Rolling walker (2 wheeled) Transfers: Sit to/from [employer] Sit to Stand: Supervision Stand pivot transfers: Supervision       General transfer comment: supervision for safety. No physical assist.  Ambulation/Gait Ambulation/Gait assistance: Supervision Gait Distance (Feet): 150 Feet Assistive device: Rolling walker (2 wheeled) Gait Pattern/deviations: Step-through pattern;Decreased stride length Gait velocity: decreased Gait velocity interpretation: 1.31 - 2.62 ft/sec, indicative of limited community ambulator General Gait Details: steady gait with RW. Supervision for safety.  Stairs            Wheelchair Mobility    Modified Rankin (Stroke Patients Only)       Balance Overall balance assessment: Mild deficits observed, not formally tested                                            Pertinent Vitals/Pain Pain Assessment: No/denies pain    Home Living Family/patient expects to be discharged to:: Assisted living               Home Equipment: Walker - 2 wheels;Shower seat;Grab bars - tub/shower      Prior Function Level of Independence: Independent with assistive device(s)         Comments: ambulates with RW to dining room for meals. Independent ADLs.     Hand Dominance        Extremity/Trunk Assessment   Upper Extremity Assessment Upper Extremity Assessment: Overall WFL for tasks assessed    Lower Extremity Assessment Lower Extremity Assessment: Overall WFL for tasks assessed       Communication   Communication: No difficulties  Cognition Arousal/Alertness: Awake/alert Behavior During Therapy: WFL for tasks assessed/performed Overall Cognitive Status: Within Functional Limits for tasks assessed                                        General Comments General comments (skin integrity, edema, etc.): No reports of dizziness during mobility.    Exercises     Assessment/Plan    PT Assessment Patent does not need any further PT services  PT Problem List         PT Treatment Interventions      PT Goals (Current goals can be found in the Care Plan section)  Acute Rehab PT Goals Patient Stated Goal: return to ALF today PT Goal Formulation: All assessment and education complete, DC therapy    Frequency     Barriers to discharge        Co-evaluation               AM-PAC PT "6 Clicks" Mobility  Outcome Measure Help needed turning from your back to your side while in a flat bed without using bedrails?: None Help needed moving from lying on your back to sitting on the side of a flat bed without using bedrails?: None Help needed moving to and from a bed to a chair (including a wheelchair)?: None Help needed standing up from a chair using your arms (e.g., wheelchair or bedside chair)?: None Help needed to  walk in hospital room?: None Help needed climbing 3-5 steps with a railing? : A Little 6 Click Score: 23    End of Session Equipment Utilized During Treatment: Gait belt Activity Tolerance: Patient tolerated treatment well Patient left: in chair;with call bell/phone within reach Nurse Communication: Mobility status PT Visit Diagnosis: Difficulty in walking, not elsewhere classified (R26.2)    Time: 1610-9604 PT Time Calculation (min) (ACUTE ONLY): 24 min   Charges:   PT Evaluation $PT Eval Low Complexity: 1 Low PT Treatments $Gait Training: 8-22 mins        Lorrin Goodell, PT  Office # [phone] Pager [phone]   Cindy Padilla 10/16/2019, 10:05 AM

## 2019-10-16 NOTE — Discharge Summary (Signed)
Cindy Padilla, is a 79 y.o. female  DOB 1940-10-29  MRN [id number].  Admission date:  10/15/2019  Admitting Physician  Starleen Armsawood S Haroun Cotham, MD  Discharge Date:  10/16/2019   Primary MD  Ron ParkerBowen, Samuel, MD  Recommendations for primary care physician for things to follow:  -Please check CBC, CMP in 7 days. -Please adjust antihypertensive regimen as needed, losartan has been stopped, and she was kept on Norvasc only. -Diuresis as needed as well, her torsemide dose has been lowered from 100 mg to 60 mg oral daily.  CODE STATUS: DNR  Admission Diagnosis  Orthostatic hypotension [I95.1] Near syncope [R55]   Discharge Diagnosis  Orthostatic hypotension [I95.1] Near syncope [R55]    Active Problems:   HTN (hypertension)   Chronic diastolic CHF (congestive heart failure) (HCC)   Dementia with behavioral disturbance (HCC)   Bipolar 1 disorder (HCC)   COPD with chronic bronchitis (HCC)   Near syncope      Past Medical History:  Diagnosis Date  . Arthritis   . Bipolar 1 disorder (HCC)   . Bronchitis   . Chronic back pain   . Constipation   . COPD (chronic obstructive pulmonary disease) (HCC)   . Dementia (HCC)   . Diabetes mellitus   . GERD (gastroesophageal reflux disease)   . Hiatal hernia   . Hypertension   . IBS (irritable bowel syndrome)     Past Surgical History:  Procedure Laterality Date  . gallbladder    . MANDIBLE SURGERY    . PARTIAL HYSTERECTOMY         History of present illness and  Hospital Course:     Kindly see H&P for history of present illness and admission details, please review complete Labs, Consult reports and Test reports for all details in brief  HPI  from the history and physical done on the day of admission 10/15/2019  Cindy Padilla  is a 79 y.o. female, with medical history significant forbipolar disorder, COPD, mild dementia, hypertension, and  chronic venous stasis, mild dementia resents [facility] secondary to near syncope, patient report she was about to get out of her chair, she felt dizzy, lightheaded, some blurry vision, about to pass out, she did not pass out, no loss of consciousness, no fall, she does report generalized weakness and lethargy as well, she denies any focal deficits, slurred speech or facial droop, no urine or stool incontinence, reports some chest tightness, but report this is common for her usually due to anxiety, reports she had recent congestion, for which she was given prednisone, she denies cough, fever or chills. - in ED patient was noted to be orthostatic, systolic blood pressure dropped to 105 from 140, Significant for baseline creatinine around 1.05, otherwise no significant labs abnormalities, troponins were negative, EKG nonacute, treated hospitalist were consulted to admit   Hospital Course    Near syncope/orthostatic hypotension -This is secondary to orthostasis, most likely due to diuretics, and antihypertensive regimen, she was clearly orthostatic in ED.  he was admitted overnight, monitored on telemetry, with no acute events on telemetry, no bradycardia or pauses or heart block, UA was negative, 2D echo obtained, with a preserved EF, no regional wall motion abnormalities, and no significant valvular disease, she was kept on gentle hydration overnight, where her diuretics and blood pressure medications has been held, this morning she is not orthostatic, she denies any dizziness or lightheadedness, she did very well with PT, blood pressure started to increase, so she will be discharged on Norvasc only, losartan has been stopped, as well I have decreased her torsemide dose from 100 mg to 60 mg oral daily, this to be monitored, and medication doses to be adjusted if needed by PCP.  Hypertension -See above discussion, losartan has been stopped, while amlodipine has been  resumed on discharge  Dementia -Appears to be mild, continue with Aricept  Bipolar disorder -Lamictal, Seroquel and as needed Ativan  Chronic diastolic CHF -Appears to be compensated, was overnight, torsemide has been held, she remains euvolemic at time of discharge.  Aa well patient appears to be having some surgical rhinitis, she has been on prednisone at her facility, I have increased her dose to 10 mg p.o. daily for next 3 days.  Discharge Condition:  Stable  Follow UP  Follow-up Information    Ron Parker, MD Follow up.   Specialty: Internal Medicine Contact information: [address] [place] [address] [phone]             Discharge Instructions  and  Discharge Medications    Discharge Instructions    Discharge instructions   Complete by: As directed    Follow with Primary MD Ron Parker, MD in 7 days   Get CBC, CMP, 2 view Chest X ray checked  by Primary MD next visit.    Activity: As tolerated with Full fall precautions use walker/cane & assistance as needed   Disposition Home    Diet: Heart Healthy ,low salt , with feeding assistance and aspiration precautions.  For Heart failure patients - Check your Weight same time everyday, if you gain over 2 pounds, or you develop in leg swelling, experience more shortness of breath or chest pain, call your Primary MD immediately. Follow Cardiac Low Salt Diet and 1.5 lit/day fluid restriction.   On your next visit with your primary care physician please Get Medicines reviewed and adjusted.   Please request your Prim.MD to go over all Hospital Tests and Procedure/Radiological results at the follow up, please get all Hospital records sent to your Prim MD by signing hospital release before you go home.   If you experience worsening of your admission symptoms, develop shortness of breath, life threatening emergency, suicidal or homicidal thoughts you must seek medical attention immediately by  calling 911 or calling your MD immediately  if symptoms less severe.  You Must read complete instructions/literature along with all the possible adverse reactions/side effects for all the Medicines you take and that have been prescribed to you. Take any new Medicines after you have completely understood and accpet all the possible adverse reactions/side effects.   Do not drive, operating heavy machinery, perform activities at heights, swimming or participation in water activities or provide baby sitting services if your were admitted for syncope or siezures until you have seen by Primary MD or a Neurologist and advised to do so again.  Do not drive when taking Pain medications.    Do not take more than prescribed Pain, Sleep and Anxiety Medications  Special Instructions: If you have smoked or chewed Tobacco  in the last 2 yrs please stop smoking, stop any regular Alcohol  and or any Recreational drug use.  Wear Seat belts while driving.   Please note  You were cared for by a hospitalist during your hospital stay. If you have any questions about your discharge medications or the care you received while you were in the hospital after you are discharged, you can call the unit and asked to speak with the hospitalist on call if the hospitalist that took care of you is not available. Once you are discharged, your primary care physician will handle any further medical issues. Please note that NO REFILLS for any discharge medications will be authorized once you are discharged, as it is imperative that you return to your primary care physician (or establish a relationship with a primary care physician if you do not have one) for your aftercare needs so that they can reassess your need for medications and monitor your lab values.   Increase activity slowly   Complete by: As directed      Allergies as of 10/16/2019      Reactions   Aminoglycosides Other (See Comments)   Reaction:  Unknown    Aspirin  Other (See Comments)   Reaction:  Unknown    Codeine Other (See Comments)   Reaction:  Unknown    Ivp Dye [iodinated Diagnostic Agents] Other (See Comments)   Reaction:  Unknown    Morphine And Related Other (See Comments)   Reaction:  Unknown    Neomycin Other (See Comments)   Reaction:  Unknown    Penicillins Rash, Other (See Comments)   Has patient had a PCN reaction causing immediate rash, facial/tongue/throat swelling, SOB or lightheadedness with hypotension: No Has patient had a PCN reaction causing severe rash involving mucus membranes or skin necrosis: No Has patient had a PCN reaction that required hospitalization No Has patient had a PCN reaction occurring within the last 10 years: No If all of the above answers are "NO", then may proceed with Cephalosporin use.   Phenothiazines Other (See Comments)   Reaction:  Unknown    Promethazine Other (See Comments)   Reaction:  Unknown    Sulfa Antibiotics Other (See Comments)   Reaction:  Unknown    Tetracyclines & Related Other (See Comments)   Reaction: Unknown       Medication List    STOP taking these medications   docusate sodium 100 MG capsule Commonly known as: COLACE   losartan 100 MG tablet Commonly known as: COZAAR   tiotropium 18 MCG inhalation capsule Commonly known as: Spiriva HandiHaler     TAKE these medications   acetaminophen 325 MG tablet Commonly known as: TYLENOL Take 650 mg by mouth 3 (three) times daily.   albuterol 108 (90 Base) MCG/ACT inhaler Commonly known as: VENTOLIN HFA Inhale 2 puffs into the lungs every 4 (four) hours as needed for wheezing or shortness of breath.   amLODipine 5 MG tablet Commonly known as: NORVASC Take 5 mg by mouth 2 (two) times daily.   antiseptic oral rinse Liqd 15 mLs by Mouth Rinse route at bedtime.   Aspercreme w/Lidocaine 4 % cream Generic drug: lidocaine Apply 1 application topically 3 (three) times daily as needed (for pain).   atorvastatin 40 MG  tablet Commonly known as: LIPITOR Take 40 mg by mouth daily.   bisacodyl 10 MG suppository Commonly known as: DULCOLAX Place 10 mg rectally 3 (  three) times daily as needed for moderate constipation.   budesonide-formoterol 160-4.5 MCG/ACT inhaler Commonly known as: SYMBICORT Inhale 2 puffs into the lungs 2 (two) times daily.   clopidogrel 75 MG tablet Commonly known as: PLAVIX Take 75 mg by mouth daily.   dicyclomine 10 MG capsule Commonly known as: BENTYL Take 10 mg by mouth 4 (four) times daily.   donepezil 10 MG tablet Commonly known as: ARICEPT Take 10 mg by mouth at bedtime.   famotidine 20 MG tablet Commonly known as: PEPCID Take 20 mg by mouth 2 (two) times daily.   ferrous sulfate 325 (65 FE) MG tablet Take 325 mg by mouth daily with breakfast.   fluticasone 50 MCG/ACT nasal spray Commonly known as: FLONASE Place 2 sprays into both nostrils daily.   Venida Jarvis 200-200-20 MG/5ML suspension Generic drug: alum & mag hydroxide-simeth Take 30 mLs by mouth 4 (four) times daily as needed for indigestion or heartburn.   guaiFENesin 100 MG/5ML liquid Commonly known as: ROBITUSSIN Take 200 mg by mouth every 6 (six) hours as needed for cough.   guaiFENesin-dextromethorphan 100-10 MG/5ML syrup Commonly known as: ROBITUSSIN DM Take 5 mLs by mouth every 4 (four) hours as needed for cough.   hydrocortisone 25 MG suppository Commonly known as: ANUSOL-HC Place 1 suppository (25 mg total) rectally 2 (two) times daily. What changed:   when to take this  reasons to take this   Incruse Ellipta 62.5 MCG/INH Aepb Generic drug: umeclidinium bromide Inhale 1 puff into the lungs daily.   ipratropium-albuterol 0.5-2.5 (3) MG/3ML Soln Commonly known as: DUONEB Take 3 mLs by nebulization every 6 (six) hours as needed. What changed: when to take this   lamoTRIgine 200 MG tablet Commonly known as: LAMICTAL Take 200 mg by mouth 2 (two) times daily.   LORazepam 1 MG  tablet Commonly known as: ATIVAN Take 1 tablet (1 mg total) by mouth 2 (two) times daily.   LORazepam 0.5 MG tablet Commonly known as: ATIVAN Take 1 tablet (0.5 mg total) by mouth daily as needed for anxiety.   magnesium hydroxide 400 MG/5ML suspension Commonly known as: MILK OF MAGNESIA Take 30 mLs by mouth at bedtime as needed for mild constipation.   magnesium oxide 400 (241.3 Mg) MG tablet Commonly known as: MAG-OX Take 400 mg by mouth daily.   montelukast 10 MG tablet Commonly known as: SINGULAIR Take 10 mg by mouth at bedtime.   nystatin powder Generic drug: nystatin Apply 1 Bottle topically 2 (two) times daily as needed (itchy rash).   pantoprazole 20 MG tablet Commonly known as: PROTONIX Take 20 mg by mouth daily.   polyethylene glycol 17 g packet Commonly known as: MIRALAX / GLYCOLAX Take 17 g by mouth 2 (two) times daily. What changed: when to take this   potassium chloride SA 20 MEQ tablet Commonly known as: KLOR-CON Take 20 mEq by mouth daily.   predniSONE 10 MG tablet Commonly known as: DELTASONE Take 1 tablet (10 mg total) by mouth daily for 3 days. Start taking on: Oct 17, 2019 What changed:   medication strength  how much to take  additional instructions   QUEtiapine 100 MG tablet Commonly known as: SEROQUEL Take 100-150 mg by mouth See admin instructions. Taking 1 tablet ( ) in the morning and 1 & 1/2 tablet ( ) every evening   senna-docusate 8.6-50 MG tablet Commonly known as: Senokot-S Take 1 tablet by mouth 2 (two) times daily.   torsemide 20 MG tablet Commonly known as: DEMADEX Take  3 tablets (60 mg total) by mouth daily. What changed:   medication strength  how much to take   Vitamin D 50 MCG (2000 UT) tablet Take 2,000 Units by mouth daily.         Diet and Activity recommendation: See Discharge Instructions above   Consults obtained -  None   Major procedures and Radiology Reports - PLEASE review detailed  and final reports for all details, in brief -    DG Chest Portable 1 View  Result Date: 10/15/2019 CLINICAL DATA:  Chest tightness. EXAM: PORTABLE CHEST 1 VIEW COMPARISON:  04/17/2018. FINDINGS: Patient is rotated to the right. Neurostimulator noted in stable position. Heart size normal. No focal infiltrate. No pleural effusion or pneumothorax. IMPRESSION: No acute cardiopulmonary disease. Stable exam from prior study of 04/17/2018. Electronically Signed   By: Marcello Moores  Register   On: 10/15/2019 11:58   ECHOCARDIOGRAM COMPLETE  Result Date: 10/16/2019    ECHOCARDIOGRAM REPORT   Patient Name:   Cindy Padilla Date of Exam: 10/16/2019 Medical Rec #:  [id number]             Height:       63.0 in Accession #:    [id number]            Weight:       185.2 lb Date of Birth:  09-Mar-1941             BSA:          1.871 m Patient Age:    68 years              BP:           139/51 mmHg Patient Gender: F                     HR:           60 bpm. Exam Location:  Inpatient Procedure: 2D Echo and Intracardiac Opacification Agent Indications:    Syncope 780.2 / R55  History:        Patient has prior history of Echocardiogram examinations, most                 recent 02/23/2013. COPD, Signs/Symptoms:Dementia ([place]) [F03.90];                 Risk Factors:Diabetes, Hypertension and Former Smoker. GERD.  Sonographer:    Darlina Sicilian RDCS Referring Phys: [place]  1. Left ventricular ejection fraction, by estimation, is 65 to 70%. The left ventricle has normal function. The left ventricle has no regional wall motion abnormalities. There is mild concentric left ventricular hypertrophy. Left ventricular diastolic parameters are consistent with Grade I diastolic dysfunction (impaired relaxation). Elevated left atrial pressure.  2. Right ventricular systolic function is normal. The right ventricular size is normal.  3. The mitral valve is normal in structure. No evidence of mitral valve regurgitation.  No evidence of mitral stenosis.  4. The aortic valve is normal in structure. Aortic valve regurgitation is mild. Moderate aortic valve stenosis. Aortic valve mean gradient measures 35.0 mmHg.  5. The inferior vena cava is normal in size with greater than 50% respiratory variability, suggesting right atrial pressure of 3 mmHg. FINDINGS  Left Ventricle: Left ventricular ejection fraction, by estimation, is 65 to 70%. The left ventricle has normal function. The left ventricle has no regional wall motion abnormalities. Definity contrast agent was given IV to delineate the left ventricular  endocardial borders.  The left ventricular internal cavity size was normal in size. There is mild concentric left ventricular hypertrophy. Left ventricular diastolic parameters are consistent with Grade I diastolic dysfunction (impaired relaxation). Elevated left atrial pressure. Right Ventricle: The right ventricular size is normal. No increase in right ventricular wall thickness. Right ventricular systolic function is normal. Left Atrium: Left atrial size was normal in size. Right Atrium: Right atrial size was normal in size. Pericardium: There is no evidence of pericardial effusion. Mitral Valve: The mitral valve is normal in structure. Normal mobility of the mitral valve leaflets. No evidence of mitral valve regurgitation. No evidence of mitral valve stenosis. Tricuspid Valve: The tricuspid valve is normal in structure. Tricuspid valve regurgitation is mild . No evidence of tricuspid stenosis. Aortic Valve: The aortic valve is normal in structure.. There is severe thickening and severe calcifcation of the aortic valve. Aortic valve regurgitation is mild. Moderate aortic stenosis is present. There is severe thickening of the aortic valve. There  is severe calcifcation of the aortic valve. Aortic valve mean gradient measures 35.0 mmHg. Aortic valve peak gradient measures 45.3 mmHg. Aortic valve area, by VTI measures 0.68 cm.  Pulmonic Valve: The pulmonic valve was normal in structure. Pulmonic valve regurgitation is not visualized. No evidence of pulmonic stenosis. Aorta: The aortic root is normal in size and structure. Venous: The inferior vena cava is normal in size with greater than 50% respiratory variability, suggesting right atrial pressure of 3 mmHg. IAS/Shunts: No atrial level shunt detected by color flow Doppler.  LEFT VENTRICLE PLAX 2D LVIDd:         3.90 cm      Diastology LVIDs:         2.20 cm      LV e' lateral:   5.11 cm/s LV PW:         1.20 cm      LV E/e' lateral: 17.8 LV IVS:        1.10 cm      LV e' medial:    6.64 cm/s LVOT diam:     1.70 cm      LV E/e' medial:  13.7 LV SV:         55 LV SV Index:   29 LVOT Area:     2.27 cm  LV Volumes (MOD) LV vol d, MOD A2C: 100.0 ml LV vol d, MOD A4C: 94.2 ml LV vol s, MOD A2C: 33.9 ml LV vol s, MOD A4C: 31.4 ml LV SV MOD A2C:     66.1 ml LV SV MOD A4C:     94.2 ml LV SV MOD BP:      65.5 ml RIGHT VENTRICLE RV S prime:     20.30 cm/s TAPSE (M-mode): 2.1 cm LEFT ATRIUM             Index LA diam:        3.70 cm 1.98 cm/m LA Vol (A2C):   33.8 ml 18.06 ml/m LA Vol (A4C):   42.0 ml 22.44 ml/m LA Biplane Vol: 37.8 ml 20.20 ml/m  AORTIC VALVE AV Area (Vmax):    0.76 cm AV Area (Vmean):   0.78 cm AV Area (VTI):     0.68 cm AV Vmax:           336.40 cm/s AV Vmean:          242.800 cm/s AV VTI:            0.800 m AV Peak Grad:  45.3 mmHg AV Mean Grad:      35.0 mmHg LVOT Vmax:         113.00 cm/s LVOT Vmean:        83.800 cm/s LVOT VTI:          0.241 m LVOT/AV VTI ratio: 0.30  AORTA Ao Root diam: 2.80 cm MITRAL VALVE MV Area (PHT): 2.26 cm     SHUNTS MV Decel Time: 335 msec     Systemic VTI:  0.24 m MV E velocity: 90.80 cm/s   Systemic Diam: 1.70 cm MV A velocity: 112.00 cm/s MV E/A ratio:  0.81 Tobias Alexander MD Electronically signed by Tobias Alexander MD Signature Date/Time: 10/16/2019/12:20:50 PM    Final     Micro Results   Recent Results (from the past 240  hour(s))  SARS Coronavirus 2 by RT PCR (hospital order, performed in [facility] hospital lab) Nasopharyngeal Nasopharyngeal Swab     Status: None   Collection Time: 10/15/19  3:49 PM   Specimen: Nasopharyngeal Swab  Result Value Ref Range Status   SARS Coronavirus 2 NEGATIVE NEGATIVE Final    Comment: (NOTE) SARS-CoV-2 target nucleic acids are NOT DETECTED. The SARS-CoV-2 RNA is generally detectable in upper and lower respiratory specimens during the acute phase of infection. The lowest concentration of SARS-CoV-2 viral copies this assay can detect is 250 copies / mL. A negative result does not preclude SARS-CoV-2 infection and should not be used as the sole basis for treatment or other patient management decisions.  A negative result may occur with improper specimen collection / handling, submission of specimen other than nasopharyngeal swab, presence of viral mutation(s) within the areas targeted by this assay, and inadequate number of viral copies (<250 copies / mL). A negative result must be combined with clinical observations, patient history, and epidemiological information. Fact Sheet for Patients:   BoilerBrush.com.cy Fact Sheet for Healthcare Providers: https://pope.com/ This test is not yet approved or cleared  by the [place] FDA and has been authorized for detection and/or diagnosis of SARS-CoV-2 by FDA under an Emergency Use Authorization (EUA).  This EUA will remain in effect (meaning this test can be used) for the duration of the COVID-19 declaration under Section 564(b)(1) of the Act, 21 U.S.C. section 360bbb-3(b)(1), unless the authorization is terminated or revoked sooner. Performed at [facility], [address] [place], [place] [address]        Today   Subjective:   Cindy Padilla today has no headache,no chest or abdominal pain,no new weakness tingling or numbness, no lightheadedness or  dizziness once ambulated with PT, denies any near-syncope, shortness of breath.  Objective:   Blood pressure (!) 139/51, pulse 60, temperature 98.1 F (36.7 C), resp. rate 17, height  (1.6 m), weight 84 kg, SpO2 95 %.   Intake/Output Summary (Last 24 hours) at 10/16/2019 1259 Last data filed at 10/16/2019 0439 Gross per 24 hour  Intake 640 ml  Output 1150 ml  Net -510 ml    Exam Awake Alert, Oriented x 3, No new F.N deficits, Normal affect Symmetrical Chest wall movement, Good air movement bilaterally, CTAB RRR,No Gallops,Rubs or new Murmurs, No Parasternal Heave +ve B.Sounds, Abd Soft, Non tender, No rebound -guarding or rigidity. No Cyanosis, Clubbing , trace edema, No new Rash or bruise  Data Review   CBC w Diff:  Lab Results  Component Value Date   WBC 9.5 10/16/2019   HGB 11.9 (L) 10/16/2019   HCT 36.9 10/16/2019  PLT 283 10/16/2019   LYMPHOPCT 24 04/28/2019   MONOPCT 10 04/28/2019   EOSPCT 1 04/28/2019   BASOPCT 0 04/28/2019    CMP:  Lab Results  Component Value Date   NA 137 10/16/2019   NA 136 07/01/2013   K 3.5 10/16/2019   CL 99 10/16/2019   CO2 27 10/16/2019   BUN 23 10/16/2019   BUN 12 07/01/2013   CREATININE 0.98 10/16/2019   PROT 6.9 10/15/2019   PROT 6.7 07/01/2013   ALBUMIN 3.6 10/15/2019   ALBUMIN 4.2 07/01/2013   BILITOT 0.9 10/15/2019   ALKPHOS 110 10/15/2019   AST 22 10/15/2019   ALT 17 10/15/2019  .   Total Time in preparing paper work, data evaluation and todays exam - 35 minutes  Huey Bienenstock M.D on 10/16/2019 at 12:59 PM  [facility]   Office  [phone]

## 2019-10-16 NOTE — Discharge Instructions (Signed)
Follow with Primary MD Ron Parker, MD in 7 days   Get CBC, CMP, 2 view Chest X ray checked  by Primary MD next visit.    Activity: As tolerated with Full fall precautions use walker/cane & assistance as needed   Disposition Home    Diet: Heart Healthy ,low salt , with feeding assistance and aspiration precautions.  For Heart failure patients - Check your Weight same time everyday, if you gain over 2 pounds, or you develop in leg swelling, experience more shortness of breath or chest pain, call your Primary MD immediately. Follow Cardiac Low Salt Diet and 1.5 lit/day fluid restriction.   On your next visit with your primary care physician please Get Medicines reviewed and adjusted.   Please request your Prim.MD to go over all Hospital Tests and Procedure/Radiological results at the follow up, please get all Hospital records sent to your Prim MD by signing hospital release before you go home.   If you experience worsening of your admission symptoms, develop shortness of breath, life threatening emergency, suicidal or homicidal thoughts you must seek medical attention immediately by calling 911 or calling your MD immediately  if symptoms less severe.  You Must read complete instructions/literature along with all the possible adverse reactions/side effects for all the Medicines you take and that have been prescribed to you. Take any new Medicines after you have completely understood and accpet all the possible adverse reactions/side effects.   Do not drive, operating heavy machinery, perform activities at heights, swimming or participation in water activities or provide baby sitting services if your were admitted for syncope or siezures until you have seen by Primary MD or a Neurologist and advised to do so again.  Do not drive when taking Pain medications.    Do not take more than prescribed Pain, Sleep and Anxiety Medications  Special Instructions: If you have smoked or chewed  Tobacco  in the last 2 yrs please stop smoking, stop any regular Alcohol  and or any Recreational drug use.  Wear Seat belts while driving.   Please note  You were cared for by a hospitalist during your hospital stay. If you have any questions about your discharge medications or the care you received while you were in the hospital after you are discharged, you can call the unit and asked to speak with the hospitalist on call if the hospitalist that took care of you is not available. Once you are discharged, your primary care physician will handle any further medical issues. Please note that NO REFILLS for any discharge medications will be authorized once you are discharged, as it is imperative that you return to your primary care physician (or establish a relationship with a primary care physician if you do not have one) for your aftercare needs so that they can reassess your need for medications and monitor your lab values.

## 2019-10-16 NOTE — Evaluation (Signed)
Occupational Therapy Evaluation Patient Details Name: Cindy Padilla MRN: [id number] DOB: Jun 28, 1940 Today's Date: 10/16/2019    History of Present Illness 79 y.o. female, with medical history significant for bipolar disorder, COPD, mild dementia, hypertension, chronic venous stasis, and mild dementia. She presented from [facility] assisted living facility secondary to near syncope.   Clinical Impression   PTA pt living in ALF, receiving assist for LB BADL at baseline. At time of eval, pt completing bed mobility at mod I level and sit <> stands with supervision assist. No c/o dizziness noted throughout session. Pt completed functional mobility to bathroom, with min A to manage panties and pad. She then completed UB dressing with set up assist and LB dressing with min A. She states this is her baseline, for the aides at her ALF assist with LB ADLs. Given current status, no further OT needs identified. Pt is back to baseline to return to ALF. OT will sign off, thank you for this consult.    Follow Up Recommendations  No OT follow up;Other (comment)(return to ALF)    Equipment Recommendations  None recommended by OT    Recommendations for Other Services       Precautions / Restrictions Precautions Precautions: Fall Restrictions Weight Bearing Restrictions: No      Mobility Bed Mobility Overal bed mobility: Modified Independent                Transfers Overall transfer level: Needs assistance Equipment used: Rolling walker (2 wheeled) Transfers: Sit to/from [employer] Sit to Stand: Supervision Stand pivot transfers: Supervision       General transfer comment: supervision for safety. No physical assist.    Balance Overall balance assessment: Mild deficits observed, not formally tested                                         ADL either performed or assessed with clinical judgement   ADL Overall ADL's : Needs  assistance/impaired;At baseline Eating/Feeding: Independent   Grooming: Supervision/safety;Standing;Wash/dry hands   Upper Body Bathing: Set up;Sitting   Lower Body Bathing: Minimal assistance;Sit to/from stand;Sitting/lateral leans Lower Body Bathing Details (indicate cue type and reason): assist from staff at baseline Upper Body Dressing : Set up;Sitting   Lower Body Dressing: Minimal assistance;Sit to/from stand;Sitting/lateral leans Lower Body Dressing Details (indicate cue type and reason): assist from staff at baseline Toilet Transfer: Min guard;RW;Regular Toilet;Grab bars   Toileting- Clothing Manipulation and Hygiene: Set up;Sitting/lateral lean;Sit to/from stand   Tub/ Shower Transfer: Min guard;Ambulation;Shower seat;Rolling walker   Functional mobility during ADLs: Min guard;Rolling walker       Vision Patient Visual Report: No change from baseline       Perception     Praxis      Pertinent Vitals/Pain Pain Assessment: No/denies pain     Hand Dominance     Extremity/Trunk Assessment Upper Extremity Assessment Upper Extremity Assessment: Overall WFL for tasks assessed   Lower Extremity Assessment Lower Extremity Assessment: Overall WFL for tasks assessed       Communication Communication Communication: No difficulties   Cognition Arousal/Alertness: Awake/alert Behavior During Therapy: WFL for tasks assessed/performed Overall Cognitive Status: History of cognitive impairments - at baseline Area of Impairment: Memory                     Memory: Decreased short-term memory  General Comments: history of mild dementia   General Comments       Exercises     Shoulder Instructions      Home Living Family/patient expects to be discharged to:: Assisted living                             Home Equipment: Walker - 2 wheels;Shower seat;Grab bars - tub/shower          Prior Functioning/Environment Level of  Independence: Independent with assistive device(s)        Comments: ambulates with RW to dining room for meals. Independent ADLs.        OT Problem List: Decreased knowledge of use of DME or AE;Decreased activity tolerance;Impaired balance (sitting and/or standing)      OT Treatment/Interventions:      OT Goals(Current goals can be found in the care plan section) Acute Rehab OT Goals Patient Stated Goal: return to ALF today OT Goal Formulation: All assessment and education complete, DC therapy  OT Frequency:     Barriers to D/C:            Co-evaluation              AM-PAC OT "6 Clicks" Daily Activity     Outcome Measure Help from another person eating meals?: None Help from another person taking care of personal grooming?: None Help from another person toileting, which includes using toliet, bedpan, or urinal?: A Little Help from another person bathing (including washing, rinsing, drying)?: A Little Help from another person to put on and taking off regular upper body clothing?: None Help from another person to put on and taking off regular lower body clothing?: A Little 6 Click Score: 21   End of Session Equipment Utilized During Treatment: Gait belt Nurse Communication: Mobility status  Activity Tolerance: Patient tolerated treatment well Patient left: in bed;with call bell/phone within reach  OT Visit Diagnosis: Other abnormalities of gait and mobility (R26.89)                Time: 3329-5188 OT Time Calculation (min): 25 min Charges:  OT General Charges $OT Visit: 1 Visit OT Evaluation $OT Eval Moderate Complexity: 1 Mod OT Treatments $Self Care/Home Management : 8-22 mins  Zenovia Jarred, MSOT, OTR/L Acute Rehabilitation Services [facility] Office Number: [phone] Pager: [phone]  Zenovia Jarred 10/16/2019, 3:34 PM

## 2019-10-16 NOTE — Progress Notes (Signed)
Pt plan to discharge. Report call to [facility] [place] with [facility].

## 2019-10-16 NOTE — Progress Notes (Signed)
  Echocardiogram 2D Echocardiogram with definity has been performed.  Cindy Padilla M 10/16/2019, 9:07 AM

## 2020-01-16 ENCOUNTER — Other Ambulatory Visit: Payer: Self-pay

## 2020-01-16 ENCOUNTER — Emergency Department (HOSPITAL_COMMUNITY)
Admission: EM | Admit: 2020-01-16 | Discharge: 2020-01-17 | Disposition: A | Discharge status: Home / Self Care | Payer: Medicare Other | Attending: Emergency Medicine | Admitting: Emergency Medicine

## 2020-01-16 ENCOUNTER — Encounter (HOSPITAL_COMMUNITY): Payer: Self-pay | Admitting: Emergency Medicine

## 2020-01-16 DIAGNOSIS — J449 Chronic obstructive pulmonary disease, unspecified: Secondary | ICD-10-CM | POA: Insufficient documentation

## 2020-01-16 DIAGNOSIS — E119 Type 2 diabetes mellitus without complications: Secondary | ICD-10-CM | POA: Insufficient documentation

## 2020-01-16 DIAGNOSIS — I5032 Chronic diastolic (congestive) heart failure: Secondary | ICD-10-CM | POA: Insufficient documentation

## 2020-01-16 DIAGNOSIS — W19XXXA Unspecified fall, initial encounter: Secondary | ICD-10-CM

## 2020-01-16 DIAGNOSIS — Y999 Unspecified external cause status: Secondary | ICD-10-CM | POA: Insufficient documentation

## 2020-01-16 DIAGNOSIS — Y929 Unspecified place or not applicable: Secondary | ICD-10-CM | POA: Insufficient documentation

## 2020-01-16 DIAGNOSIS — W010XXA Fall on same level from slipping, tripping and stumbling without subsequent striking against object, initial encounter: Secondary | ICD-10-CM | POA: Insufficient documentation

## 2020-01-16 DIAGNOSIS — Z7901 Long term (current) use of anticoagulants: Secondary | ICD-10-CM | POA: Insufficient documentation

## 2020-01-16 DIAGNOSIS — M25551 Pain in right hip: Secondary | ICD-10-CM | POA: Diagnosis present

## 2020-01-16 DIAGNOSIS — E876 Hypokalemia: Secondary | ICD-10-CM | POA: Diagnosis not present

## 2020-01-16 DIAGNOSIS — Z87891 Personal history of nicotine dependence: Secondary | ICD-10-CM | POA: Insufficient documentation

## 2020-01-16 DIAGNOSIS — I110 Hypertensive heart disease with heart failure: Secondary | ICD-10-CM | POA: Diagnosis not present

## 2020-01-16 DIAGNOSIS — Y939 Activity, unspecified: Secondary | ICD-10-CM | POA: Diagnosis not present

## 2020-01-16 NOTE — ED Triage Notes (Addendum)
Patient arrived with EMS from [facility] nursing home , fell from her bad this evening with no LOC , reports pain ay right hip worse with movement , respirations unlabored , alert and oriented.

## 2020-01-17 ENCOUNTER — Emergency Department (HOSPITAL_COMMUNITY): Payer: Medicare Other

## 2020-01-17 DIAGNOSIS — M25551 Pain in right hip: Secondary | ICD-10-CM | POA: Diagnosis not present

## 2020-01-17 LAB — CBC WITH DIFFERENTIAL/PLATELET
Abs Immature Granulocytes: 0.03 10*3/uL (ref 0.00–0.07)
Basophils Absolute: 0 10*3/uL (ref 0.0–0.1)
Basophils Relative: 1 %
Eosinophils Absolute: 0.2 10*3/uL (ref 0.0–0.5)
Eosinophils Relative: 3 %
HCT: 38.6 % (ref 36.0–46.0)
Hemoglobin: 12.1 g/dL (ref 12.0–15.0)
Immature Granulocytes: 0 %
Lymphocytes Relative: 36 %
Lymphs Abs: 2.6 10*3/uL (ref 0.7–4.0)
MCH: 28.9 pg (ref 26.0–34.0)
MCHC: 31.3 g/dL (ref 30.0–36.0)
MCV: 92.3 fL (ref 80.0–100.0)
Monocytes Absolute: 0.9 10*3/uL (ref 0.1–1.0)
Monocytes Relative: 12 %
Neutro Abs: 3.6 10*3/uL (ref 1.7–7.7)
Neutrophils Relative %: 48 %
Platelets: 263 10*3/uL (ref 150–400)
RBC: 4.18 MIL/uL (ref 3.87–5.11)
RDW: 14.1 % (ref 11.5–15.5)
WBC: 7.3 10*3/uL (ref 4.0–10.5)
nRBC: 0 % (ref 0.0–0.2)

## 2020-01-17 LAB — BASIC METABOLIC PANEL
Anion gap: 13 (ref 5–15)
BUN: 11 mg/dL (ref 8–23)
CO2: 28 mmol/L (ref 22–32)
Calcium: 9.3 mg/dL (ref 8.9–10.3)
Chloride: 101 mmol/L (ref 98–111)
Creatinine, Ser: 1.11 mg/dL — ABNORMAL HIGH (ref 0.44–1.00)
GFR calc Af Amer: 54 mL/min — ABNORMAL LOW (ref >60)
GFR calc non Af Amer: 47 mL/min — ABNORMAL LOW (ref >60)
Glucose, Bld: 119 mg/dL — ABNORMAL HIGH (ref 70–99)
Potassium: 3.1 mmol/L — ABNORMAL LOW (ref 3.5–5.1)
Sodium: 142 mmol/L (ref 135–145)

## 2020-01-17 MED ORDER — POTASSIUM CHLORIDE ER 10 MEQ PO TBCR
10.0000 meq | EXTENDED_RELEASE_TABLET | Freq: Every day | ORAL | 0 refills | Status: DC
Start: 2020-01-17 — End: 2020-04-17

## 2020-01-17 NOTE — Discharge Instructions (Signed)
Your potassium is noted to be a little bit low in the emergency department.  You were given a prescription for potassium supplementation for the next several days.  You will need to follow-up with your regular doctor to have these levels rechecked.  Please return to the emergency department for any new or worsening symptoms in the meantime.

## 2020-01-17 NOTE — ED Notes (Signed)
Attempted report, no answer; left message requesting phone call be returned

## 2020-01-17 NOTE — ED Provider Notes (Signed)
[facility] EMERGENCY DEPARTMENT Provider Note   CSN: [id number] Arrival date & time: 01/16/20  2257     History Chief Complaint  Patient presents with  . Fall    Cindy Padilla is a 79 y.o. female.  HPI   79 year old female with a history of arthritis, bipolar 1, COPD, diabetes hypertension IBS, obviously since the emergency department today for evaluation after a fall.  Patient states that normally she keeps her landline in her room however someone turned it off last night so when she went to get up this morning out of her bed she slipped and fell onto her right side.  She is complaining of some mild right hip pain.  States she has been ambulatory since this occurred and she does not want to come to the emergency department but her facility was concerned for possible fracture so sent her here.  She denies any head trauma or LOC.  Denies any neurologic symptoms at this time.  Denies any other injuries. Reviewed meds per paperwork from facility at bedside, pt not anticoagulated.   Past Medical History:  Diagnosis Date  . Arthritis   . Bipolar 1 disorder (HCC)   . Bronchitis   . Chronic back pain   . Constipation   . COPD (chronic obstructive pulmonary disease) (HCC)   . Dementia (HCC)   . Diabetes mellitus   . GERD (gastroesophageal reflux disease)   . Hiatal hernia   . Hypertension   . IBS (irritable bowel syndrome)     Patient Active Problem List   Diagnosis Date Noted  . Near syncope 10/15/2019  . 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  . BRBPR (bright red blood per rectum) 04/28/2019  . Acute exacerbation of chronic obstructive pulmonary disease (COPD) (HCC) 02/26/2017  . COPD with acute exacerbation (HCC) 02/24/2017  . Bipolar 1 disorder (HCC) 02/24/2017  . Normocytic anemia 02/24/2017  . Morbid obesity (HCC) 04/14/2015  . Upper airway cough syndrome 04/14/2015  . Ventral hernia 04/24/2014  . Constipation 04/24/2014  . Hemorrhoids  04/22/2014  . Abnormality of gait 09/30/2013  . 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 Chronic diastolic CHF (congestive heart failure) (HCC) 07/27/2013  . Dementia with behavioral disturbance (HCC) 07/27/2013  . Weakness 02/09/2013  . Hyponatremia 02/09/2013  . Shortness of breath 02/09/2013  . HTN (hypertension) 02/09/2013  . Other and unspecified hyperlipidemia 02/09/2013    Past Surgical History:  Procedure Laterality Date  . gallbladder    . MANDIBLE SURGERY    . PARTIAL HYSTERECTOMY       OB History   No obstetric history on file.     Family History  Problem Relation Age of Onset  . Emphysema Mother   . Cancer Other     Social History   Tobacco Use  . Smoking status: Former Smoker    Packs/day: 0.50    Years: 2.00    Pack years: 1.00    Types: Cigarettes    Quit date: 05/28/1959    Years since quitting: 60.6  . Smokeless tobacco: Former Neurosurgeon    Types: Snuff    Quit date: 05/28/1959  . Tobacco comment: Pt used snuff for 33 years  Vaping Use  . Vaping Use: Never used  Substance Use Topics  . Alcohol use: No  . Drug use: No    Home Medications Prior to Admission medications   Medication Sig Start Date End Date Taking? Authorizing Provider  acetaminophen (TYLENOL) 325 MG  tablet Take 650 mg by mouth 3 (three) times daily.    [provider]  albuterol (PROVENTIL HFA;VENTOLIN HFA) 108 (90 BASE) MCG/ACT inhaler Inhale 2 puffs into the lungs every 4 (four) hours as needed for wheezing or shortness of breath.    [provider]  alum & mag hydroxide-simeth (GERI-LANTA) 200-200-20 MG/5ML suspension Take 30 mLs by mouth 4 (four) times daily as needed for indigestion or heartburn.     [provider]  amLODipine (NORVASC) 5 MG tablet Take 5 mg by mouth 2 (two) times daily.     [provider]  antiseptic oral rinse (BIOTENE) LIQD 15 mLs by Mouth Rinse route at bedtime.    [provider]    atorvastatin (LIPITOR) 40 MG tablet Take 40 mg by mouth daily.     [provider]  bisacodyl (DULCOLAX) 10 MG suppository Place 10 mg rectally 3 (three) times daily as needed for moderate constipation.    [provider]  budesonide-formoterol (SYMBICORT) 160-4.5 MCG/ACT inhaler Inhale 2 puffs into the lungs 2 (two) times daily.     [provider]  Cholecalciferol (VITAMIN D) 50 MCG (2000 UT) tablet Take 2,000 Units by mouth daily.     [provider]  clopidogrel (PLAVIX) 75 MG tablet Take 75 mg by mouth daily.     [provider]  dicyclomine (BENTYL) 10 MG capsule Take 10 mg by mouth 4 (four) times daily.    [provider]  donepezil (ARICEPT) 10 MG tablet Take 10 mg by mouth at bedtime.    [provider]  famotidine (PEPCID) 20 MG tablet Take 20 mg by mouth 2 (two) times daily.    [provider]  ferrous sulfate 325 (65 FE) MG tablet Take 325 mg by mouth daily with breakfast.    [provider]  fluticasone (FLONASE) 50 MCG/ACT nasal spray Place 2 sprays into both nostrils daily.     [provider]  guaiFENesin (ROBITUSSIN) 100 MG/5ML liquid Take 200 mg by mouth every 6 (six) hours as needed for cough.    [provider]  guaiFENesin-dextromethorphan (ROBITUSSIN DM) 100-10 MG/5ML syrup Take 5 mLs by mouth every 4 (four) hours as needed for cough. 03/04/17   Osvaldo ShipperKrishnan, Gokul, MD  hydrocortisone (ANUSOL-HC) 25 MG suppository Place 1 suppository (25 mg total) rectally 2 (two) times daily. Patient taking differently: Place 25 mg rectally 2 (two) times daily as needed for hemorrhoids.  04/30/19   Kathlen ModyAkula, Vijaya, MD  INCRUSE ELLIPTA 62.5 MCG/INH AEPB Inhale 1 puff into the lungs daily.  04/17/19   [provider]  ipratropium-albuterol (DUONEB) 0.5-2.5 (3) MG/3ML SOLN Take 3 mLs by nebulization every 6 (six) hours as needed. Patient taking differently: Take 3 mLs by nebulization 4 (four)  times daily.  04/21/18   Barnetta Chapelgbata, Sylvester I, MD  lamoTRIgine (LAMICTAL) 200 MG tablet Take 200 mg by mouth 2 (two) times daily.    [provider]  lidocaine (ASPERCREME W/LIDOCAINE) 4 % cream Apply 1 application topically 3 (three) times daily as needed (for pain).     [provider]  LORazepam (ATIVAN) 0.5 MG tablet Take 1 tablet (0.5 mg total) by mouth daily as needed for anxiety. 10/16/19   Elgergawy, Leana Roeawood S, MD  LORazepam (ATIVAN) 1 MG tablet Take 1 tablet (1 mg total) by mouth 2 (two) times daily. 10/16/19   Elgergawy, Leana Roeawood S, MD  magnesium hydroxide (MILK OF MAGNESIA) 400 MG/5ML suspension Take 30 mLs by mouth at bedtime  as needed for mild constipation.    [provider]  magnesium oxide (MAG-OX) 400 (241.3 Mg) MG tablet Take 400 mg by mouth daily.    [provider]  montelukast (SINGULAIR) 10 MG tablet Take 10 mg by mouth at bedtime.    [provider]  nystatin (NYSTATIN) powder Apply 1 Bottle topically 2 (two) times daily as needed (itchy rash).    [provider]  pantoprazole (PROTONIX) 20 MG tablet Take 20 mg by mouth daily. 04/17/19   [provider]  polyethylene glycol (MIRALAX / GLYCOLAX) 17 g packet Take 17 g by mouth 2 (two) times daily. Patient taking differently: Take 17 g by mouth daily.  04/30/19   Kathlen Mody, MD  potassium chloride (KLOR-CON) 10 MEQ tablet Take 1 tablet (10 mEq total) by mouth daily for 5 days. 01/17/20 01/22/20  Lyndal Reggio S, PA-C  potassium chloride SA (KLOR-CON) 20 MEQ tablet Take 20 mEq by mouth daily. 03/15/19   [provider]  QUEtiapine (SEROQUEL) 100 MG tablet Take 100-150 mg by mouth See admin instructions. Taking 1 tablet (100mg ) in the morning and 1 & 1/2 tablet (150mg ) every evening    [provider]  senna-docusate (SENOKOT-S) 8.6-50 MG per tablet Take 1 tablet by mouth 2 (two) times daily.    [provider]  torsemide (DEMADEX) 20 MG tablet  Take 3 tablets (60 mg total) by mouth daily. 10/16/19   Elgergawy, 09-02-1979, MD    Allergies    Aminoglycosides, Aspirin, Codeine, Ivp dye [iodinated diagnostic agents], Morphine and related, Neomycin, Penicillins, Phenothiazines, Promethazine, Sulfa antibiotics, and Tetracyclines & related  Review of Systems   Review of Systems  Constitutional: Negative for fever.  HENT: Negative for ear pain and sore throat.   Eyes: Negative for visual disturbance.  Respiratory: Negative for cough and shortness of breath.   Cardiovascular: Negative for chest pain.  Gastrointestinal: Negative for abdominal pain, constipation, diarrhea, nausea and vomiting.  Genitourinary: Negative for dysuria and hematuria.  Musculoskeletal: Negative for back pain and neck pain.       Right hip pain  Skin: Negative for rash.  Neurological: Negative for headaches.       No head trauma or loc  All other systems reviewed and are negative.   Physical Exam Updated Vital Signs BP (!) 168/62   Pulse 78   Temp 98.7 F (37.1 C) (Oral)   Resp 18   Ht 5' (1.524 m)   Wt 85 kg   SpO2 97%   BMI 36.60 kg/m   Physical Exam Vitals and nursing note reviewed.  Constitutional:      General: She is not in acute distress.    Appearance: She is well-developed.  HENT:     Head: Normocephalic and atraumatic.  Eyes:     Conjunctiva/sclera: Conjunctivae normal.  Cardiovascular:     Rate and Rhythm: Normal rate and regular rhythm.     Heart sounds: Normal heart sounds. No murmur heard.   Pulmonary:     Effort: Pulmonary effort is normal. No respiratory distress.     Breath sounds: Normal breath sounds. No wheezing, rhonchi or rales.  Abdominal:     General: Bowel sounds are normal.     Palpations: Abdomen is soft.     Tenderness: There is no abdominal tenderness. There is no guarding or rebound.  Musculoskeletal:     Cervical back: Neck supple.     Comments: Mild TTP to the right hip.  Skin:  General: Skin is warm  and dry.  Neurological:     Mental Status: She is alert.     Comments: Mental Status:  Alert, thought content appropriate, able to give a coherent history. Speech fluent without evidence of aphasia. Able to follow 2 step commands without difficulty.  Cranial Nerves:  II:  pupils equal, round, reactive to light III,IV, VI: ptosis not present, extra-ocular motions intact bilaterally  V,VII: smile symmetric, facial light touch sensation equal VIII: hearing grossly normal to voice  X: uvula elevates symmetrically  XI: bilateral shoulder shrug symmetric and strong XII: midline tongue extension without fassiculations Motor:  Normal tone. 5/5 strength of BUE and BLE major muscle groups including strong and equal grip strength and dorsiflexion/plantar flexion Sensory: light touch normal in all extremities. Gait: normal gait and balance.       ED Results / Procedures / Treatments   Labs (all labs ordered are listed, but only abnormal results are displayed) Labs Reviewed  BASIC METABOLIC PANEL - Abnormal; Notable for the following components:      Result Value   Potassium 3.1 (*)    Glucose, Bld 119 (*)    Creatinine, Ser 1.11 (*)    GFR calc non Af Amer 47 (*)    GFR calc Af Amer 54 (*)    All other components within normal limits  CBC WITH DIFFERENTIAL/PLATELET    EKG None  Radiology DG Hip Unilat W or Wo Pelvis 2-3 Views Right  Result Date: 01/17/2020 CLINICAL DATA:  Fall, right hip pain EXAM: DG HIP (WITH OR WITHOUT PELVIS) 2-3V RIGHT COMPARISON:  None. FINDINGS: Single view radiograph of the pelvis demonstrates degenerative changes within the lower lumbar spine, pubic symphysis and hips bilaterally. There is no fracture or dislocation, however. No destructive osseous lesion. Soft tissues are unremarkable. IMPRESSION: Degenerative changes without acute findings. Electronically Signed   By: Helyn Numbers MD   On: 01/17/2020 00:44    Procedures Procedures (including critical  care time)  Medications Ordered in ED Medications - No data to display  ED Course  I have reviewed the triage vital signs and the nursing notes.  Pertinent labs & imaging results that were available during my care of the patient were reviewed by me and considered in my medical decision making (see chart for details).    MDM Rules/Calculators/A&P                          Patient with mechanical fall earlier this morning onto her right hip.  Complaining of right hip pain but has been ambulatory since the fall.  No head trauma or LOC.  Is not on anticoagulation.  X-ray of the right hip reviewed/interpreted and does not show any evidence of fracture or other bony abnormality.  Patient reports only mild pain in the ED and is ambulatory down the hallway with a walker she typically uses at home.  Reviewed/interpreted her labs.  She has some mild hypokalemia so we will give her Rx for supplementation for home.  Advised on rechecking labs and following up with PCP.  Advised on return precautions.  She voices understanding of plan reasons to return. all questions answered.  Patient stable discharge.  Final Clinical Impression(s) / ED Diagnoses Final diagnoses:  Fall, initial encounter  Right hip pain  Hypokalemia    Rx / DC Orders ED Discharge Orders         Ordered    potassium chloride (KLOR-CON) 10  MEQ tablet  Daily        01/17/20 1536           Karrie Meres, PA-C 01/17/20 1536    Long, Arlyss Repress, MD 01/18/20 [id number]

## 2020-01-17 NOTE — ED Notes (Signed)
Patient verbalizes understanding of discharge instructions. Opportunity for questioning and answers were provided. Pt discharged from ED. 

## 2020-04-13 ENCOUNTER — Encounter (HOSPITAL_COMMUNITY): Payer: Self-pay | Admitting: Emergency Medicine

## 2020-04-13 ENCOUNTER — Inpatient Hospital Stay (HOSPITAL_COMMUNITY)
Admission: EM | Admit: 2020-04-13 | Discharge: 2020-04-17 | DRG: 394 | Disposition: A | Discharge status: Home / Self Care | Payer: Medicare Other | Source: Skilled Nursing Facility | Attending: Student | Admitting: Student

## 2020-04-13 ENCOUNTER — Other Ambulatory Visit: Payer: Self-pay

## 2020-04-13 ENCOUNTER — Emergency Department (HOSPITAL_COMMUNITY): Payer: Medicare Other

## 2020-04-13 DIAGNOSIS — G2401 Drug induced subacute dyskinesia: Secondary | ICD-10-CM | POA: Diagnosis present

## 2020-04-13 DIAGNOSIS — K5900 Constipation, unspecified: Secondary | ICD-10-CM | POA: Diagnosis present

## 2020-04-13 DIAGNOSIS — K922 Gastrointestinal hemorrhage, unspecified: Secondary | ICD-10-CM | POA: Diagnosis present

## 2020-04-13 DIAGNOSIS — K319 Disease of stomach and duodenum, unspecified: Secondary | ICD-10-CM | POA: Diagnosis present

## 2020-04-13 DIAGNOSIS — R195 Other fecal abnormalities: Secondary | ICD-10-CM

## 2020-04-13 DIAGNOSIS — Z7902 Long term (current) use of antithrombotics/antiplatelets: Secondary | ICD-10-CM

## 2020-04-13 DIAGNOSIS — Z87891 Personal history of nicotine dependence: Secondary | ICD-10-CM

## 2020-04-13 DIAGNOSIS — D649 Anemia, unspecified: Secondary | ICD-10-CM | POA: Diagnosis present

## 2020-04-13 DIAGNOSIS — F03918 Unspecified dementia, unspecified severity, with other behavioral disturbance: Secondary | ICD-10-CM | POA: Diagnosis present

## 2020-04-13 DIAGNOSIS — Z66 Do not resuscitate: Secondary | ICD-10-CM | POA: Diagnosis present

## 2020-04-13 DIAGNOSIS — I110 Hypertensive heart disease with heart failure: Secondary | ICD-10-CM | POA: Diagnosis present

## 2020-04-13 DIAGNOSIS — E785 Hyperlipidemia, unspecified: Secondary | ICD-10-CM | POA: Diagnosis present

## 2020-04-13 DIAGNOSIS — K649 Unspecified hemorrhoids: Secondary | ICD-10-CM | POA: Diagnosis not present

## 2020-04-13 DIAGNOSIS — Z20822 Contact with and (suspected) exposure to covid-19: Secondary | ICD-10-CM | POA: Diagnosis present

## 2020-04-13 DIAGNOSIS — R109 Unspecified abdominal pain: Secondary | ICD-10-CM

## 2020-04-13 DIAGNOSIS — T502X5A Adverse effect of carbonic-anhydrase inhibitors, benzothiadiazides and other diuretics, initial encounter: Secondary | ICD-10-CM | POA: Diagnosis present

## 2020-04-13 DIAGNOSIS — K2100 Gastro-esophageal reflux disease with esophagitis, without bleeding: Secondary | ICD-10-CM | POA: Diagnosis present

## 2020-04-13 DIAGNOSIS — J449 Chronic obstructive pulmonary disease, unspecified: Secondary | ICD-10-CM | POA: Diagnosis present

## 2020-04-13 DIAGNOSIS — K449 Diaphragmatic hernia without obstruction or gangrene: Secondary | ICD-10-CM | POA: Diagnosis present

## 2020-04-13 DIAGNOSIS — I5032 Chronic diastolic (congestive) heart failure: Secondary | ICD-10-CM | POA: Diagnosis present

## 2020-04-13 DIAGNOSIS — Z79899 Other long term (current) drug therapy: Secondary | ICD-10-CM

## 2020-04-13 DIAGNOSIS — I10 Essential (primary) hypertension: Secondary | ICD-10-CM | POA: Diagnosis present

## 2020-04-13 DIAGNOSIS — K921 Melena: Secondary | ICD-10-CM

## 2020-04-13 DIAGNOSIS — F319 Bipolar disorder, unspecified: Secondary | ICD-10-CM | POA: Diagnosis present

## 2020-04-13 DIAGNOSIS — Z825 Family history of asthma and other chronic lower respiratory diseases: Secondary | ICD-10-CM

## 2020-04-13 DIAGNOSIS — Z7951 Long term (current) use of inhaled steroids: Secondary | ICD-10-CM

## 2020-04-13 DIAGNOSIS — F0391 Unspecified dementia with behavioral disturbance: Secondary | ICD-10-CM | POA: Diagnosis present

## 2020-04-13 DIAGNOSIS — E876 Hypokalemia: Secondary | ICD-10-CM | POA: Diagnosis present

## 2020-04-13 LAB — COMPREHENSIVE METABOLIC PANEL
ALT: 27 U/L (ref 0–44)
AST: 38 U/L (ref 15–41)
Albumin: 4.4 g/dL (ref 3.5–5.0)
Alkaline Phosphatase: 149 U/L — ABNORMAL HIGH (ref 38–126)
Anion gap: 16 — ABNORMAL HIGH (ref 5–15)
BUN: 19 mg/dL (ref 8–23)
CO2: 25 mmol/L (ref 22–32)
Calcium: 9.4 mg/dL (ref 8.9–10.3)
Chloride: 94 mmol/L — ABNORMAL LOW (ref 98–111)
Creatinine, Ser: 1.09 mg/dL — ABNORMAL HIGH (ref 0.44–1.00)
GFR, Estimated: 51 mL/min — ABNORMAL LOW (ref >60)
Glucose, Bld: 143 mg/dL — ABNORMAL HIGH (ref 70–99)
Potassium: 3.8 mmol/L (ref 3.5–5.1)
Sodium: 135 mmol/L (ref 135–145)
Total Bilirubin: 0.6 mg/dL (ref 0.3–1.2)
Total Protein: 8.7 g/dL — ABNORMAL HIGH (ref 6.5–8.1)

## 2020-04-13 LAB — CBC WITH DIFFERENTIAL/PLATELET
Abs Immature Granulocytes: 0.04 10*3/uL (ref 0.00–0.07)
Basophils Absolute: 0 10*3/uL (ref 0.0–0.1)
Basophils Relative: 0 %
Eosinophils Absolute: 0 10*3/uL (ref 0.0–0.5)
Eosinophils Relative: 0 %
HCT: 37.7 % (ref 36.0–46.0)
Hemoglobin: 12.6 g/dL (ref 12.0–15.0)
Immature Granulocytes: 0 %
Lymphocytes Relative: 14 %
Lymphs Abs: 1.8 10*3/uL (ref 0.7–4.0)
MCH: 30.1 pg (ref 26.0–34.0)
MCHC: 33.4 g/dL (ref 30.0–36.0)
MCV: 90 fL (ref 80.0–100.0)
Monocytes Absolute: 1 10*3/uL (ref 0.1–1.0)
Monocytes Relative: 8 %
Neutro Abs: 9.7 10*3/uL — ABNORMAL HIGH (ref 1.7–7.7)
Neutrophils Relative %: 78 %
Platelets: 259 10*3/uL (ref 150–400)
RBC: 4.19 MIL/uL (ref 3.87–5.11)
RDW: 14 % (ref 11.5–15.5)
WBC: 12.5 10*3/uL — ABNORMAL HIGH (ref 4.0–10.5)
nRBC: 0 % (ref 0.0–0.2)

## 2020-04-13 LAB — POC OCCULT BLOOD, ED: Fecal Occult Bld: POSITIVE — AB

## 2020-04-13 LAB — RESP PANEL BY RT-PCR (FLU A&B, COVID) ARPGX2
Influenza A by PCR: NEGATIVE
Influenza B by PCR: NEGATIVE
SARS Coronavirus 2 by RT PCR: NEGATIVE

## 2020-04-13 MED ORDER — ZINC OXIDE 20 % EX OINT
TOPICAL_OINTMENT | CUTANEOUS | Status: DC | PRN
  Filled 2020-04-13: qty 28.35

## 2020-04-13 NOTE — ED Notes (Signed)
Patient is in the restroom.  

## 2020-04-13 NOTE — ED Triage Notes (Signed)
Per EMS- Patient is a resident of [address]. Patient has a history of Alzheimer's  Patient was given a suppository for constipation. Patient had stool, but felt like she still had stool present and began removing her stool. Patient has hemorrhoids and caused herself to bleed. Staff called EMS.

## 2020-04-13 NOTE — ED Provider Notes (Signed)
[facility]-EMERGENCY DEPT Provider Note   CSN: [id number] Arrival date & time: 04/13/20  5035     History Chief Complaint  Patient presents with  . Rectal Bleeding    Cindy Padilla is a 79 y.o. female.  79 year old female with history of dementia sent by nursing facility for constipation and bleeding hemorrhoid. Patient states she has been constipated all day with fecal urgency and unable to get comfortable despite miralax and suppository at the facility. Patient states she used a gloved finger with vaseline to try and clear her stool but was unsuccessful and her hemorrhoids started to bleed. Patient is on plavix, denies history of GI bleed. States she has abdominal pain from time to time but does not think this is any worse today. No other complaints or concerns.         Past Medical History:  Diagnosis Date  . Arthritis   . Bipolar 1 disorder (HCC)   . Bronchitis   . Chronic back pain   . Constipation   . COPD (chronic obstructive pulmonary disease) (HCC)   . Dementia (HCC)   . Diabetes mellitus   . GERD (gastroesophageal reflux disease)   . Hiatal hernia   . Hypertension   . IBS (irritable bowel syndrome)     Patient Active Problem List   Diagnosis Date Noted  . Acute GI bleeding 04/13/2020  . Near syncope 10/15/2019  . 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  . BRBPR (bright red blood per rectum) 04/28/2019  . Acute exacerbation of chronic obstructive pulmonary disease (COPD) (HCC) 02/26/2017  . COPD with acute exacerbation (HCC) 02/24/2017  . Bipolar 1 disorder (HCC) 02/24/2017  . Normocytic anemia 02/24/2017  . Morbid obesity (HCC) 04/14/2015  . Upper airway cough syndrome 04/14/2015  . Ventral hernia 04/24/2014  . Constipation 04/24/2014  . Hemorrhoids 04/22/2014  . Abnormality of gait 09/30/2013  . 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 Chronic diastolic CHF (congestive heart failure)  (HCC) 07/27/2013  . Dementia with behavioral disturbance (HCC) 07/27/2013  . Weakness 02/09/2013  . Hyponatremia 02/09/2013  . Shortness of breath 02/09/2013  . HTN (hypertension) 02/09/2013  . Other and unspecified hyperlipidemia 02/09/2013    Past Surgical History:  Procedure Laterality Date  . gallbladder    . MANDIBLE SURGERY    . PARTIAL HYSTERECTOMY       OB History   No obstetric history on file.     Family History  Problem Relation Age of Onset  . Emphysema Mother   . Cancer Other     Social History   Tobacco Use  . Smoking status: Former Smoker    Packs/day: 0.50    Years: 2.00    Pack years: 1.00    Types: Cigarettes    Quit date: 05/28/1959    Years since quitting: 60.9  . Smokeless tobacco: Former Neurosurgeon    Types: Snuff    Quit date: 05/28/1959  . Tobacco comment: Pt used snuff for 33 years  Vaping Use  . Vaping Use: Never used  Substance Use Topics  . Alcohol use: No  . Drug use: No    Home Medications Prior to Admission medications   Medication Sig Start Date End Date Taking? Authorizing Provider  acetaminophen (TYLENOL) 325 MG tablet Take 650 mg by mouth 3 (three) times daily.    [provider]  albuterol (PROVENTIL HFA;VENTOLIN HFA) 108 (90 BASE) MCG/ACT inhaler Inhale 2 puffs into the lungs every  4 (four) hours as needed for wheezing or shortness of breath.    [provider]  alum & mag hydroxide-simeth (GERI-LANTA) 200-200-20 MG/5ML suspension Take 30 mLs by mouth 4 (four) times daily as needed for indigestion or heartburn.     [provider]  amLODipine (NORVASC) 5 MG tablet Take 5 mg by mouth 2 (two) times daily.     [provider]  antiseptic oral rinse (BIOTENE) LIQD 15 mLs by Mouth Rinse route at bedtime.    [provider]  atorvastatin (LIPITOR) 40 MG tablet Take 40 mg by mouth daily.     [provider]  bisacodyl (DULCOLAX) 10 MG suppository Place 10 mg rectally 3 (three) times  daily as needed for moderate constipation.    [provider]  budesonide-formoterol (SYMBICORT) 160-4.5 MCG/ACT inhaler Inhale 2 puffs into the lungs 2 (two) times daily.     [provider]  Cholecalciferol (VITAMIN D) 50 MCG (2000 UT) tablet Take 2,000 Units by mouth daily.     [provider]  clopidogrel (PLAVIX) 75 MG tablet Take 75 mg by mouth daily.     [provider]  dicyclomine (BENTYL) 10 MG capsule Take 10 mg by mouth 4 (four) times daily.    [provider]  donepezil (ARICEPT) 10 MG tablet Take 10 mg by mouth at bedtime.    [provider]  famotidine (PEPCID) 20 MG tablet Take 20 mg by mouth 2 (two) times daily.    [provider]  ferrous sulfate 325 (65 FE) MG tablet Take 325 mg by mouth daily with breakfast.    [provider]  fluticasone (FLONASE) 50 MCG/ACT nasal spray Place 2 sprays into both nostrils daily.     [provider]  guaiFENesin (ROBITUSSIN) 100 MG/5ML liquid Take 200 mg by mouth every 6 (six) hours as needed for cough.    [provider]  guaiFENesin-dextromethorphan (ROBITUSSIN DM) 100-10 MG/5ML syrup Take 5 mLs by mouth every 4 (four) hours as needed for cough. 03/04/17   [name], MD  hydrocortisone (ANUSOL-HC) 25 MG suppository Place 1 suppository (25 mg total) rectally 2 (two) times daily. Patient taking differently: Place 25 mg rectally 2 (two) times daily as needed for hemorrhoids.  04/30/19   Kathlen Mody, MD  INCRUSE ELLIPTA 62.5 MCG/INH AEPB Inhale 1 puff into the lungs daily.  04/17/19   [provider]  ipratropium-albuterol (DUONEB) 0.5-2.5 (3) MG/3ML SOLN Take 3 mLs by nebulization every 6 (six) hours as needed. Patient taking differently: Take 3 mLs by nebulization 4 (four) times daily.  04/21/18   Barnetta Chapel, MD  lamoTRIgine (LAMICTAL) 200 MG tablet Take 200 mg by mouth 2 (two) times daily.    [provider]  lidocaine  (ASPERCREME W/LIDOCAINE) 4 % cream Apply 1 application topically 3 (three) times daily as needed (for pain).     [provider]  LORazepam (ATIVAN) 0.5 MG tablet Take 1 tablet (0.5 mg total) by mouth daily as needed for anxiety. 10/16/19   Elgergawy, Leana Roe, MD  LORazepam (ATIVAN) 1 MG tablet Take 1 tablet (1 mg total) by mouth 2 (two) times daily. 10/16/19   Elgergawy, Leana Roe, MD  magnesium hydroxide (MILK OF MAGNESIA) 400 MG/5ML suspension Take 30 mLs by mouth at bedtime as needed for mild constipation.    [provider]  magnesium oxide (MAG-OX) 400 (241.3 Mg) MG tablet Take 400 mg by mouth daily.    [provider]  montelukast (  SINGULAIR) 10 MG tablet Take 10 mg by mouth at bedtime.    [provider]  nystatin (NYSTATIN) powder Apply 1 Bottle topically 2 (two) times daily as needed (itchy rash).    [provider]  pantoprazole (PROTONIX) 20 MG tablet Take 20 mg by mouth daily. 04/17/19   [provider]  polyethylene glycol (MIRALAX / GLYCOLAX) 17 g packet Take 17 g by mouth 2 (two) times daily. Patient taking differently: Take 17 g by mouth daily.  04/30/19   Kathlen Mody, MD  potassium chloride (KLOR-CON) 10 MEQ tablet Take 1 tablet (10 mEq total) by mouth daily for 5 days. 01/17/20 01/22/20  Couture, Cortni S, PA-C  potassium chloride SA (KLOR-CON) 20 MEQ tablet Take 20 mEq by mouth daily. 03/15/19   [provider]  QUEtiapine (SEROQUEL) 100 MG tablet Take 100-150 mg by mouth See admin instructions. Taking 1 tablet (100mg ) in the morning and 1 & 1/2 tablet (150mg ) every evening    [provider]  senna-docusate (SENOKOT-S) 8.6-50 MG per tablet Take 1 tablet by mouth 2 (two) times daily.    [provider]  torsemide (DEMADEX) 20 MG tablet Take 3 tablets (60 mg total) by mouth daily. 10/16/19   Elgergawy, 09-02-1979, MD    Allergies    Aminoglycosides, Aspirin, Codeine, Ivp dye [iodinated diagnostic agents],  Morphine and related, Neomycin, Penicillins, Phenothiazines, Promethazine, Sulfa antibiotics, and Tetracyclines & related  Review of Systems   Review of Systems  Unable to perform ROS: Dementia    Physical Exam Updated Vital Signs BP (!) 143/64   Pulse 89   Resp 16   SpO2 100%   Physical Exam Vitals and nursing note reviewed.  Constitutional:      General: She is not in acute distress.    Appearance: She is well-developed. She is not diaphoretic.  HENT:     Head: Normocephalic and atraumatic.  Cardiovascular:     Rate and Rhythm: Normal rate and regular rhythm.     Heart sounds: Normal heart sounds.  Pulmonary:     Effort: Pulmonary effort is normal.     Breath sounds: Normal breath sounds.  Abdominal:     Palpations: Abdomen is soft.     Tenderness: There is no abdominal tenderness.  Genitourinary:    Comments: Stool is black and hemoccult positive. Multiple hemorrhoids, no obviously bleeding hemorrhoids.  Musculoskeletal:     Right lower leg: No edema.     Left lower leg: No edema.  Skin:    General: Skin is warm and dry.     Findings: No erythema or rash.  Neurological:     General: No focal deficit present.     Mental Status: She is alert.     Sensory: No sensory deficit.     Motor: No weakness.  Psychiatric:        Behavior: Behavior normal.     ED Results / Procedures / Treatments   Labs (all labs ordered are listed, but only abnormal results are displayed) Labs Reviewed  CBC WITH DIFFERENTIAL/PLATELET - Abnormal; Notable for the following components:      Result Value   WBC 12.5 (*)    Neutro Abs 9.7 (*)    All other components within normal limits  COMPREHENSIVE METABOLIC PANEL - Abnormal; Notable for the following components:   Chloride 94 (*)    Glucose, Bld 143 (*)    Creatinine, Ser 1.09 (*)    Total Protein 8.7 (*)  Alkaline Phosphatase 149 (*)    GFR, Estimated 51 (*)    Anion gap 16 (*)    All other components within normal limits    POC OCCULT BLOOD, ED - Abnormal; Notable for the following components:   Fecal Occult Bld POSITIVE (*)    All other components within normal limits  RESP PANEL BY RT-PCR (FLU A&B, COVID) [id number]    EKG None  Radiology CT Abdomen Pelvis Wo Contrast  Result Date: 04/13/2020 CLINICAL DATA:  Abdominal pain EXAM: CT ABDOMEN AND PELVIS WITHOUT CONTRAST TECHNIQUE: Multidetector CT imaging of the abdomen and pelvis was performed following the standard protocol without IV contrast. COMPARISON:  10/31/2016 FINDINGS: Lower chest: Lung bases are clear. No effusions. Heart is normal size. Hepatobiliary: Cysts scattered throughout the liver, unchanged. Prior cholecystectomy. Pancreas: No focal abnormality or ductal dilatation. Spleen: No focal abnormality.  Normal size. Adrenals/Urinary Tract: No adrenal abnormality. No focal renal abnormality. No stones or hydronephrosis. Urinary bladder is unremarkable. Stomach/Bowel: There is at the upper abdominal midline ventral hernia which contains the distal stomach and proximal duodenum. No evidence of bowel obstruction. Moderate stool in the rectum. Vascular/Lymphatic: No evidence of aneurysm or adenopathy. Aortic calcifications. Reproductive: Prior hysterectomy.  No adnexal masses. Other: No free fluid or free air. Musculoskeletal: No acute bony abnormality. IMPRESSION: Moderate stool in the rectum.  Remainder of the colon unremarkable. Upper abdominal midline ventral hernia containing the distal stomach and proximal duodenum, unchanged since prior study. Aortic atherosclerosis. Stable hepatic cysts. No acute findings. Electronically Signed   By: Charlett NoseKevin  Dover M.D.   On: 04/13/2020 21:19    Procedures Procedures (including critical care time)  Medications Ordered in ED Medications  zinc oxide 20 % ointment (has no administration in time range)    ED Course  I have reviewed the triage vital signs and the nursing notes.  Pertinent labs & imaging results that  were available during my care of the patient were reviewed by me and considered in my medical decision making (see chart for details).  Clinical Course as of Apr 14 2143  Thu Apr 13, 2020  [id number] 79 year old female with complaint of bleeding hemorrhoids and constipation, vague report of abdominal pain. On exam, found to have black stool which she states is not normal for her. Stool is hemoccult positive, complicated by history of bleeding hemorrhoids today, no active bleeding hemorrhoids at time of exam.  Patient is on plavix. Vitals stable, CBC with normal H&H, no significant change from prior. CMP with normal BUN. CT negative for acute process. Discussed with Dr. Bernette MayersSheldon, ER attending, plan is to admit for observation due to GI bleed on Plavix.   [LM]  2143 Case discussed with Dr. Toniann FailKakrakandy who will consult for admission.   [LM]    Clinical Course User Index [LM] Alden HippMurphy, Jeani Fassnacht A, PA-C   MDM Rules/Calculators/A&P                          Final Clinical Impression(s) / ED Diagnoses Final diagnoses:  Acute GI bleeding    Rx / DC Orders ED Discharge Orders    None       Alden HippMurphy, Haidee Stogsdill A, PA-C 04/13/20 2144    Pollyann SavoySheldon, Charles B, MD 04/13/20 2208

## 2020-04-14 ENCOUNTER — Observation Stay (HOSPITAL_COMMUNITY): Payer: Medicare Other | Admitting: Anesthesiology

## 2020-04-14 ENCOUNTER — Encounter (HOSPITAL_COMMUNITY)
Admission: EM | Disposition: A | Discharge status: Home / Self Care | Payer: Self-pay | Source: Skilled Nursing Facility | Attending: Internal Medicine

## 2020-04-14 ENCOUNTER — Encounter (HOSPITAL_COMMUNITY): Payer: Self-pay | Admitting: Internal Medicine

## 2020-04-14 DIAGNOSIS — F0391 Unspecified dementia with behavioral disturbance: Secondary | ICD-10-CM | POA: Diagnosis not present

## 2020-04-14 DIAGNOSIS — E876 Hypokalemia: Secondary | ICD-10-CM | POA: Diagnosis not present

## 2020-04-14 DIAGNOSIS — Z825 Family history of asthma and other chronic lower respiratory diseases: Secondary | ICD-10-CM | POA: Diagnosis not present

## 2020-04-14 DIAGNOSIS — T502X5A Adverse effect of carbonic-anhydrase inhibitors, benzothiadiazides and other diuretics, initial encounter: Secondary | ICD-10-CM | POA: Diagnosis present

## 2020-04-14 DIAGNOSIS — K642 Third degree hemorrhoids: Secondary | ICD-10-CM

## 2020-04-14 DIAGNOSIS — Z7902 Long term (current) use of antithrombotics/antiplatelets: Secondary | ICD-10-CM | POA: Diagnosis not present

## 2020-04-14 DIAGNOSIS — J449 Chronic obstructive pulmonary disease, unspecified: Secondary | ICD-10-CM

## 2020-04-14 DIAGNOSIS — D649 Anemia, unspecified: Secondary | ICD-10-CM | POA: Diagnosis not present

## 2020-04-14 DIAGNOSIS — I110 Hypertensive heart disease with heart failure: Secondary | ICD-10-CM | POA: Diagnosis not present

## 2020-04-14 DIAGNOSIS — F319 Bipolar disorder, unspecified: Secondary | ICD-10-CM | POA: Diagnosis not present

## 2020-04-14 DIAGNOSIS — K649 Unspecified hemorrhoids: Secondary | ICD-10-CM | POA: Diagnosis not present

## 2020-04-14 DIAGNOSIS — K5900 Constipation, unspecified: Secondary | ICD-10-CM | POA: Diagnosis present

## 2020-04-14 DIAGNOSIS — R195 Other fecal abnormalities: Secondary | ICD-10-CM

## 2020-04-14 DIAGNOSIS — I5032 Chronic diastolic (congestive) heart failure: Secondary | ICD-10-CM | POA: Diagnosis not present

## 2020-04-14 DIAGNOSIS — K625 Hemorrhage of anus and rectum: Secondary | ICD-10-CM | POA: Diagnosis not present

## 2020-04-14 DIAGNOSIS — K922 Gastrointestinal hemorrhage, unspecified: Secondary | ICD-10-CM | POA: Diagnosis present

## 2020-04-14 DIAGNOSIS — Z7951 Long term (current) use of inhaled steroids: Secondary | ICD-10-CM | POA: Diagnosis not present

## 2020-04-14 DIAGNOSIS — E785 Hyperlipidemia, unspecified: Secondary | ICD-10-CM | POA: Diagnosis not present

## 2020-04-14 DIAGNOSIS — Z79899 Other long term (current) drug therapy: Secondary | ICD-10-CM | POA: Diagnosis not present

## 2020-04-14 DIAGNOSIS — G2401 Drug induced subacute dyskinesia: Secondary | ICD-10-CM

## 2020-04-14 DIAGNOSIS — I10 Essential (primary) hypertension: Secondary | ICD-10-CM

## 2020-04-14 DIAGNOSIS — K921 Melena: Secondary | ICD-10-CM

## 2020-04-14 DIAGNOSIS — K319 Disease of stomach and duodenum, unspecified: Secondary | ICD-10-CM | POA: Diagnosis present

## 2020-04-14 DIAGNOSIS — K2100 Gastro-esophageal reflux disease with esophagitis, without bleeding: Secondary | ICD-10-CM | POA: Diagnosis not present

## 2020-04-14 DIAGNOSIS — K449 Diaphragmatic hernia without obstruction or gangrene: Secondary | ICD-10-CM | POA: Diagnosis present

## 2020-04-14 DIAGNOSIS — Z87891 Personal history of nicotine dependence: Secondary | ICD-10-CM | POA: Diagnosis not present

## 2020-04-14 DIAGNOSIS — Z66 Do not resuscitate: Secondary | ICD-10-CM | POA: Diagnosis not present

## 2020-04-14 DIAGNOSIS — Z20822 Contact with and (suspected) exposure to covid-19: Secondary | ICD-10-CM | POA: Diagnosis not present

## 2020-04-14 HISTORY — PX: ESOPHAGOGASTRODUODENOSCOPY (EGD) WITH PROPOFOL: SHX5813

## 2020-04-14 LAB — CBC
HCT: 34.3 % — ABNORMAL LOW (ref 36.0–46.0)
HCT: 33.1 % — ABNORMAL LOW (ref 36.0–46.0)
HCT: 34.3 % — ABNORMAL LOW (ref 36.0–46.0)
Hemoglobin: 11.4 g/dL — ABNORMAL LOW (ref 12.0–15.0)
Hemoglobin: 11.2 g/dL — ABNORMAL LOW (ref 12.0–15.0)
Hemoglobin: 11.3 g/dL — ABNORMAL LOW (ref 12.0–15.0)
MCH: 30.2 pg (ref 26.0–34.0)
MCH: 30.6 pg (ref 26.0–34.0)
MCH: 30.1 pg (ref 26.0–34.0)
MCHC: 33.2 g/dL (ref 30.0–36.0)
MCHC: 33.8 g/dL (ref 30.0–36.0)
MCHC: 32.9 g/dL (ref 30.0–36.0)
MCV: 91 fL (ref 80.0–100.0)
MCV: 90.4 fL (ref 80.0–100.0)
MCV: 91.5 fL (ref 80.0–100.0)
Platelets: 230 10*3/uL (ref 150–400)
Platelets: 236 10*3/uL (ref 150–400)
Platelets: 231 10*3/uL (ref 150–400)
RBC: 3.77 MIL/uL — ABNORMAL LOW (ref 3.87–5.11)
RBC: 3.66 MIL/uL — ABNORMAL LOW (ref 3.87–5.11)
RBC: 3.75 MIL/uL — ABNORMAL LOW (ref 3.87–5.11)
RDW: 14 % (ref 11.5–15.5)
RDW: 14.1 % (ref 11.5–15.5)
RDW: 14.3 % (ref 11.5–15.5)
WBC: 9.4 10*3/uL (ref 4.0–10.5)
WBC: 9.3 10*3/uL (ref 4.0–10.5)
WBC: 8.1 10*3/uL (ref 4.0–10.5)
nRBC: 0 % (ref 0.0–0.2)
nRBC: 0 % (ref 0.0–0.2)
nRBC: 0 % (ref 0.0–0.2)

## 2020-04-14 LAB — GLUCOSE, CAPILLARY: Glucose-Capillary: 106 mg/dL — ABNORMAL HIGH (ref 70–99)

## 2020-04-14 LAB — CBG MONITORING, ED
Glucose-Capillary: 129 mg/dL — ABNORMAL HIGH (ref 70–99)
Glucose-Capillary: 112 mg/dL — ABNORMAL HIGH (ref 70–99)

## 2020-04-14 SURGERY — ESOPHAGOGASTRODUODENOSCOPY (EGD) WITH PROPOFOL
Anesthesia: Monitor Anesthesia Care

## 2020-04-14 MED ORDER — LAMOTRIGINE 100 MG PO TABS
200.0000 mg | ORAL_TABLET | Freq: Two times a day (BID) | ORAL | Status: DC
  Administered 2020-04-14 – 2020-04-17 (×8): 200 mg via ORAL
  Filled 2020-04-14 (×9): qty 2

## 2020-04-14 MED ORDER — HYDRALAZINE HCL 20 MG/ML IJ SOLN: 10.0000 mg | INTRAMUSCULAR | Status: DC | PRN

## 2020-04-14 MED ORDER — ACETAMINOPHEN 325 MG PO TABS
650.0000 mg | ORAL_TABLET | Freq: Four times a day (QID) | ORAL | Status: DC | PRN
  Administered 2020-04-14 – 2020-04-17 (×3): 650 mg via ORAL
  Filled 2020-04-14 (×3): qty 2

## 2020-04-14 MED ORDER — HYDROCORTISONE (PERIANAL) 2.5 % EX CREA
TOPICAL_CREAM | Freq: Two times a day (BID) | CUTANEOUS | Status: DC
  Administered 2020-04-15 – 2020-04-16 (×2): 1 via RECTAL
  Filled 2020-04-14 (×3): qty 28.35

## 2020-04-14 MED ORDER — PROPOFOL 500 MG/50ML IV EMUL
INTRAVENOUS | Status: DC | PRN
  Administered 2020-04-14: 135 ug/kg/min via INTRAVENOUS

## 2020-04-14 MED ORDER — FLUTICASONE PROPIONATE 50 MCG/ACT NA SUSP
2.0000 | Freq: Every day | NASAL | Status: DC
  Administered 2020-04-16 – 2020-04-17 (×2): 2 via NASAL
  Filled 2020-04-14 (×2): qty 16

## 2020-04-14 MED ORDER — SODIUM CHLORIDE 0.9 % IV SOLN: INTRAVENOUS | Status: DC

## 2020-04-14 MED ORDER — QUETIAPINE FUMARATE 100 MG PO TABS
100.0000 mg | ORAL_TABLET | Freq: Every day | ORAL | Status: DC
  Administered 2020-04-14 – 2020-04-17 (×4): 100 mg via ORAL
  Filled 2020-04-14 (×2): qty 1
  Filled 2020-04-14: qty 2
  Filled 2020-04-14 (×2): qty 1

## 2020-04-14 MED ORDER — MAGNESIUM CITRATE PO SOLN: 1.0000 | Freq: Once | ORAL | Status: DC

## 2020-04-14 MED ORDER — SODIUM CHLORIDE 0.9 % IV SOLN: INTRAVENOUS | Status: AC

## 2020-04-14 MED ORDER — MOMETASONE FURO-FORMOTEROL FUM 200-5 MCG/ACT IN AERO
2.0000 | INHALATION_SPRAY | Freq: Two times a day (BID) | RESPIRATORY_TRACT | Status: DC
  Administered 2020-04-14 – 2020-04-17 (×5): 2 via RESPIRATORY_TRACT
  Filled 2020-04-14 (×3): qty 8.8

## 2020-04-14 MED ORDER — PROPOFOL 10 MG/ML IV BOLUS
INTRAVENOUS | Status: DC | PRN
  Administered 2020-04-14: 10 mg via INTRAVENOUS

## 2020-04-14 MED ORDER — QUETIAPINE FUMARATE 50 MG PO TABS
150.0000 mg | ORAL_TABLET | Freq: Every day | ORAL | Status: DC
  Administered 2020-04-15 – 2020-04-16 (×3): 150 mg via ORAL
  Filled 2020-04-14 (×4): qty 1

## 2020-04-14 MED ORDER — LORAZEPAM 2 MG/ML IJ SOLN
0.5000 mg | Freq: Once | INTRAMUSCULAR | Status: AC
  Administered 2020-04-14: 0.5 mg via INTRAVENOUS
  Filled 2020-04-14: qty 1

## 2020-04-14 MED ORDER — LACTATED RINGERS IV SOLN
INTRAVENOUS | Status: DC
  Administered 2020-04-14: 1000 mL via INTRAVENOUS

## 2020-04-14 MED ORDER — ACETAMINOPHEN 650 MG RE SUPP: 650.0000 mg | Freq: Four times a day (QID) | RECTAL | Status: DC | PRN

## 2020-04-14 MED ORDER — ALBUTEROL SULFATE HFA 108 (90 BASE) MCG/ACT IN AERS
2.0000 | INHALATION_SPRAY | RESPIRATORY_TRACT | Status: DC | PRN
  Administered 2020-04-14: 2 via RESPIRATORY_TRACT
  Filled 2020-04-14: qty 6.7

## 2020-04-14 MED ORDER — LORAZEPAM 0.5 MG PO TABS: 0.5000 mg | ORAL_TABLET | Freq: Every day | ORAL | Status: DC | PRN

## 2020-04-14 MED ORDER — LORAZEPAM 1 MG PO TABS
1.0000 mg | ORAL_TABLET | Freq: Two times a day (BID) | ORAL | Status: DC
  Administered 2020-04-14 – 2020-04-17 (×8): 1 mg via ORAL
  Filled 2020-04-14 (×9): qty 1

## 2020-04-14 MED ORDER — PANTOPRAZOLE SODIUM 40 MG IV SOLR
40.0000 mg | Freq: Two times a day (BID) | INTRAVENOUS | Status: DC
  Administered 2020-04-14 – 2020-04-17 (×7): 40 mg via INTRAVENOUS
  Filled 2020-04-14 (×8): qty 40

## 2020-04-14 MED ORDER — SORBITOL 70 % SOLN
960.0000 mL | TOPICAL_OIL | Freq: Once | ORAL | Status: DC
  Filled 2020-04-14 (×2): qty 473

## 2020-04-14 SURGICAL SUPPLY — 14 items

## 2020-04-14 NOTE — ED Notes (Signed)
Pt resting comfortably at this time. Respirations even and unlabored. Pt lying on her left side, NADN, no other VS taken at this time in order to promote rest and healing.

## 2020-04-14 NOTE — Anesthesia Procedure Notes (Signed)
Procedure Name: MAC Date/Time: 04/14/2020 12:09 PM Performed by: Niel Hummer, CRNA Pre-anesthesia Checklist: Patient identified, Emergency Drugs available, Suction available and Patient being monitored Oxygen Delivery Method: Simple face mask

## 2020-04-14 NOTE — Progress Notes (Addendum)
PROGRESS NOTE    Cindy Padilla  IEP:[id number] DOB: 01/19/1941 DOA: 04/13/2020 PCP: Ron Parker, MD    Brief Narrative:  79 y.o. female with known history of hypertension, bipolar disorder, hyperlipidemia, COPD try to disimpact her stools at the skilled facility when patient stated she started bleeding likely from the hemorrhoids.  Since patient was on Plavix patient was transferred to the ER.  Patient states she was having some constipation try some MiraLAX and then tried suppository which did not give much resolved and patient tried to disimpact when she started bleeding.  Denies any abdominal pain nausea vomiting denies taking any NSAIDs.  Per GI consult done in December 2020 patient has had history of diverticulosis to previous sigmoidoscopy.  Assessment & Plan:   Principal Problem:   Acute GI bleeding Active Problems:   HTN (hypertension)   Chronic diastolic CHF (congestive heart failure) (HCC)   Dementia with behavioral disturbance (HCC)   Dyskinesia, drug-induced   COPD with chronic bronchitis (HCC)   Melena   Heme positive stool   1. Rectal bleeding with chronic normocytic anemia 1. Pt noted to have dark, tarry stools in ED 2. On plavix prior to admit, which is held for now 3. CBC trends reviewed. Hgb remains stable at 11.3 which appears to be pt's baseline 4. Appreciate input by GI. Pt underwent EGD with results reviewed. Findings of erosive esophagitis without bleeding. Recommendation for Protonix (pantoprazole) 40 mg PO BID X 2 months followed by Protonix 40mg  daily 5. Per GI, repeat CBC in AM and if hgb stable in AM, than can d/c tomorrow. 6. Plan to resume plavix in 5 days per GI recs 2. Hypertension 1. Continued on PRN IV hydralazine and replacing it taking oral medications. 3. Bipolar disorder 1. Continue on Lamictal and Seroquel. 4. History of chronic diastolic CHF 1. Currently appears compensated.  DVT prophylaxis: SCD's Code Status: DNR Family  Communication: Pt in room, family not at bedside  Status is: Observation  The patient will require care spanning > 2 midnights and should be moved to inpatient because: Unsafe d/c plan and Inpatient level of care appropriate due to severity of illness  Dispo: The patient is from: SNF              Anticipated d/c is to: SNF              Anticipated d/c date is: 1 day              Patient currently is not medically stable to d/c.  Consultants:   GI  Procedures:   EGD 11/19  Antimicrobials: Anti-infectives (From admission, onward)   None       Subjective: Fixated on wanting to eat something this AM while NPO awaiting GI eval  Objective: Vitals:   04/14/20 1237 04/14/20 1240 04/14/20 1250 04/14/20 1300  BP: (!) 119/40 (!) 122/34 128/64 (!) 145/39  Pulse: 67 76 71 66  Resp: 19 20 17 17   Temp: 98.5 F (36.9 C)     TempSrc: Axillary     SpO2: 100% 99% 91% 92%  Weight:      Height:       No intake or output data in the 24 hours ending 04/14/20 1702 Filed Weights   04/14/20 0950 04/14/20 1144  Weight: 78.8 kg 78.8 kg    Examination:  General exam: Appears calm and comfortable  Respiratory system: Clear to auscultation. Respiratory effort normal. Cardiovascular system: S1 & S2 heard, Regular Gastrointestinal system: Abdomen  is nondistended, soft and nontender. No organomegaly or masses felt. Normal bowel sounds heard. Central nervous system: Alert and oriented. No focal neurological deficits. Extremities: Symmetric 5 x 5 power. Skin: No rashes, lesions  Psychiatry: Judgement and insight appear normal. Mood & affect appropriate.   Data Reviewed: I have personally reviewed following labs and imaging studies  CBC: Recent Labs  Lab 04/13/20 2048 04/14/20 0124 04/14/20 0404 04/14/20 1047  WBC 12.5* 9.4 9.3 8.1  NEUTROABS 9.7*  --   --   --   HGB 12.6 11.4* 11.2* 11.3*  HCT 37.7 34.3* 33.1* 34.3*  MCV 90.0 91.0 90.4 91.5  PLT 259 230 236 231   Basic  Metabolic Panel: Recent Labs  Lab 04/13/20 2048  NA 135  K 3.8  CL 94*  CO2 25  GLUCOSE 143*  BUN 19  CREATININE 1.09*  CALCIUM 9.4   GFR: Estimated Creatinine Clearance: 38.2 mL/min (A) (by C-G formula based on SCr of 1.09 mg/dL (H)). Liver Function Tests: Recent Labs  Lab 04/13/20 2048  AST 38  ALT 27  ALKPHOS 149*  BILITOT 0.6  PROT 8.7*  ALBUMIN 4.4   No results for input(s): LIPASE, AMYLASE in the last 168 hours. No results for input(s): AMMONIA in the last 168 hours. Coagulation Profile: No results for input(s): INR, PROTIME in the last 168 hours. Cardiac Enzymes: No results for input(s): CKTOTAL, CKMB, CKMBINDEX, TROPONINI in the last 168 hours. BNP (last 3 results) No results for input(s): PROBNP in the last 8760 hours. HbA1C: No results for input(s): HGBA1C in the last 72 hours. CBG: Recent Labs  Lab 04/14/20 0110 04/14/20 0626  GLUCAP 129* 112*   Lipid Profile: No results for input(s): CHOL, HDL, LDLCALC, TRIG, CHOLHDL, LDLDIRECT in the last 72 hours. Thyroid Function Tests: No results for input(s): TSH, T4TOTAL, FREET4, T3FREE, THYROIDAB in the last 72 hours. Anemia Panel: No results for input(s): VITAMINB12, FOLATE, FERRITIN, TIBC, IRON, RETICCTPCT in the last 72 hours. Sepsis Labs: No results for input(s): PROCALCITON, LATICACIDVEN in the last 168 hours.  Recent Results (from the past 240 hour(s))  Resp Panel by RT-PCR (Flu A&B, Covid) Nasopharyngeal Swab     Status: None   Collection Time: 04/13/20  9:50 PM   Specimen: Nasopharyngeal Swab; Nasopharyngeal(NP) swabs in vial transport medium  Result Value Ref Range Status   SARS Coronavirus 2 by RT PCR NEGATIVE NEGATIVE Final    Comment: (NOTE) SARS-CoV-2 target nucleic acids are NOT DETECTED.  The SARS-CoV-2 RNA is generally detectable in upper respiratory specimens during the acute phase of infection. The lowest concentration of SARS-CoV-2 viral copies this assay can detect is 138  copies/mL. A negative result does not preclude SARS-Cov-2 infection and should not be used as the sole basis for treatment or other patient management decisions. A negative result may occur with  improper specimen collection/handling, submission of specimen other than nasopharyngeal swab, presence of viral mutation(s) within the areas targeted by this assay, and inadequate number of viral copies(<138 copies/mL). A negative result must be combined with clinical observations, patient history, and epidemiological information. The expected result is Negative.  Fact Sheet for Patients:  BloggerCourse.com  Fact Sheet for Healthcare Providers:  SeriousBroker.it  This test is no t yet approved or cleared by the [place] FDA and  has been authorized for detection and/or diagnosis of SARS-CoV-2 by FDA under an Emergency Use Authorization (EUA). This EUA will remain  in effect (meaning this test can be used) for the duration of the  COVID-19 declaration under Section 564(b)(1) of the Act, 21 U.S.C.section 360bbb-3(b)(1), unless the authorization is terminated  or revoked sooner.       Influenza A by PCR NEGATIVE NEGATIVE Final   Influenza B by PCR NEGATIVE NEGATIVE Final    Comment: (NOTE) The Xpert Xpress SARS-CoV-2/FLU/RSV plus assay is intended as an aid in the diagnosis of influenza from Nasopharyngeal swab specimens and should not be used as a sole basis for treatment. Nasal washings and aspirates are unacceptable for Xpert Xpress SARS-CoV-2/FLU/RSV testing.  Fact Sheet for Patients: BloggerCourse.com  Fact Sheet for Healthcare Providers: SeriousBroker.it  This test is not yet approved or cleared by the [place] FDA and has been authorized for detection and/or diagnosis of SARS-CoV-2 by FDA under an Emergency Use Authorization (EUA). This EUA will remain in effect (meaning  this test can be used) for the duration of the COVID-19 declaration under Section 564(b)(1) of the Act, 21 U.S.C. section 360bbb-3(b)(1), unless the authorization is terminated or revoked.  Performed at [facility], [address] [place], [place] [address]      Radiology Studies: CT Abdomen Pelvis Wo Contrast  Result Date: 04/13/2020 CLINICAL DATA:  Abdominal pain EXAM: CT ABDOMEN AND PELVIS WITHOUT CONTRAST TECHNIQUE: Multidetector CT imaging of the abdomen and pelvis was performed following the standard protocol without IV contrast. COMPARISON:  10/31/2016 FINDINGS: Lower chest: Lung bases are clear. No effusions. Heart is normal size. Hepatobiliary: Cysts scattered throughout the liver, unchanged. Prior cholecystectomy. Pancreas: No focal abnormality or ductal dilatation. Spleen: No focal abnormality.  Normal size. Adrenals/Urinary Tract: No adrenal abnormality. No focal renal abnormality. No stones or hydronephrosis. Urinary bladder is unremarkable. Stomach/Bowel: There is at the upper abdominal midline ventral hernia which contains the distal stomach and proximal duodenum. No evidence of bowel obstruction. Moderate stool in the rectum. Vascular/Lymphatic: No evidence of aneurysm or adenopathy. Aortic calcifications. Reproductive: Prior hysterectomy.  No adnexal masses. Other: No free fluid or free air. Musculoskeletal: No acute bony abnormality. IMPRESSION: Moderate stool in the rectum.  Remainder of the colon unremarkable. Upper abdominal midline ventral hernia containing the distal stomach and proximal duodenum, unchanged since prior study. Aortic atherosclerosis. Stable hepatic cysts. No acute findings. Electronically Signed   By: Charlett Nose M.D.   On: 04/13/2020 21:19    Scheduled Meds: . fluticasone  2 spray Each Nare Daily  . hydrocortisone   Rectal BID  . lamoTRIgine  200 mg Oral BID  . LORazepam  1 mg Oral BID  . mometasone-formoterol  2 puff Inhalation BID    . pantoprazole (PROTONIX) IV  40 mg Intravenous Q12H  . QUEtiapine  100 mg Oral Daily  . QUEtiapine  150 mg Oral QHS  . sorbitol, milk of mag, mineral oil, glycerin (SMOG) enema  960 mL Rectal Once   Continuous Infusions: . sodium chloride 50 mL/hr at 04/14/20 1112  . lactated ringers Stopped (04/14/20 1300)     LOS: 0 days   Rickey Barbara, MD [facility] Pager On Amion  If 7PM-7AM, please contact night-coverage 04/14/2020, 5:02 PM

## 2020-04-14 NOTE — Progress Notes (Signed)
Pt very agitated.  Pt refused enema and states " she doesn't want anyone sticking anything up her".  Pt is also distressed about her daughter and states "my daughter is going to commit suicide and theres nothing I can do"  Pt is crying now.  Pt is only oriented to name and DOB.  Pt stated she is at the drugstore.  Notified dr and will continue to monitor.

## 2020-04-14 NOTE — ED Notes (Signed)
Called report to [facility], Charity fundraiser on 6E

## 2020-04-14 NOTE — Consult Note (Addendum)
Referring Provider:  [facility] Primary Care Physician:  Ron Parker, MD Primary Gastroenterologist:  Dr. Opal Sidles in [facility]  Reason for Consultation:  GI bleed  HPI: Cindy Padilla is a 79 y.o. female with known history of hypertension, bipolar disorder, hyperlipidemia, COPD.  Report was that they were trying to disimpact her stools at the skilled facility when patient resides and she started bleeding likely from the hemorrhoids.  Since patient was on Plavix patient was transferred to the ER.  She has history of bleeding hemorrhoids and uses preparation and hydrocortisone on those.  Then here upon evaluation in the ED she was found to have black stools that were heme positive on rectal exam.  Denies any abdominal pain, nausea, vomiting.  She wants to eat.  Denies taking any NSAIDs.  Is on Plavix for unknown diagnosis.  Says that she takes iron at home every day but I do not see if on her list.  She tells me that her stools have been black for about a week.  She tells me that she feels like there is stool in her rectum but she cannot get it to come out.  BUN is normal.  Hgb close to normal.  She is on pantoprazole 40 mg IV BID for now.  CT of the abdomen and pelvis without contrast showed the following:  IMPRESSION: Moderate stool in the rectum.  Remainder of the colon unremarkable.  Upper abdominal midline ventral hernia containing the distal stomach and proximal duodenum, unchanged since prior study.  Aortic atherosclerosis.  Stable hepatic cysts.  No acute findings.   She follows with Dr. Opal Sidles for gastroenterology in [place].  I reviewed some of the records in care everywhere.  Had a flexible sigmoidoscopy with Dr. Opal Sidles in January 2016 for complaints of rectal bleeding showed moderately severe external hemorrhoids, but was otherwise normal.  Last colonoscopy was in 2009 it was normal aside from hemorrhoids.  She apparently also had a flexible sigmoidoscopy in  2014 that revealed moderate external hemorrhoids with anal ulceration and skin tags.  She has had recurrent issues of rectal bleeding.  We saw here in consult here for rectal bleeding in 04/2019 and found large bleeding hemorrhoids on rectal exam/anoscopy.  We recommended treatment and then to follow-up with Dr. Opal Sidles, which does not look like was done.   Past Medical History:  Diagnosis Date  . Arthritis   . Bipolar 1 disorder (HCC)   . Bronchitis   . Chronic back pain   . Constipation   . COPD (chronic obstructive pulmonary disease) (HCC)   . Dementia (HCC)   . Diabetes mellitus   . GERD (gastroesophageal reflux disease)   . Hiatal hernia   . Hypertension   . IBS (irritable bowel syndrome)     Past Surgical History:  Procedure Laterality Date  . gallbladder    . MANDIBLE SURGERY    . PARTIAL HYSTERECTOMY      Prior to Admission medications   Medication Sig Start Date End Date Taking? Authorizing Provider  acetaminophen (TYLENOL) 325 MG tablet Take 650 mg by mouth 3 (three) times daily.   Yes [provider]  albuterol (PROVENTIL HFA;VENTOLIN HFA) 108 (90 BASE) MCG/ACT inhaler Inhale 2 puffs into the lungs every 4 (four) hours as needed for wheezing or shortness of breath.   Yes [provider]  alum & mag hydroxide-simeth (GERI-LANTA) 200-200-20 MG/5ML suspension Take 30 mLs by mouth 4 (four) times daily as needed for indigestion or heartburn.  Yes [provider]  amLODipine (NORVASC) 5 MG tablet Take 5 mg by mouth 2 (two) times daily.    Yes [provider]  antiseptic oral rinse (BIOTENE) LIQD 15 mLs by Mouth Rinse route at bedtime.   Yes [provider]  atorvastatin (LIPITOR) 40 MG tablet Take 40 mg by mouth daily.    Yes [provider]  bisacodyl (DULCOLAX) 10 MG suppository Place 10 mg rectally 3 (three) times daily as needed for moderate constipation.   Yes [provider]  budesonide-formoterol  (SYMBICORT) 160-4.5 MCG/ACT inhaler Inhale 2 puffs into the lungs 2 (two) times daily.    Yes [provider]  Cholecalciferol (VITAMIN D) 50 MCG (2000 UT) tablet Take 2,000 Units by mouth daily.    Yes [provider]  clopidogrel (PLAVIX) 75 MG tablet Take 75 mg by mouth daily.    Yes [provider]  dicyclomine (BENTYL) 10 MG capsule Take 10 mg by mouth 4 (four) times daily.   Yes [provider]  donepezil (ARICEPT) 10 MG tablet Take 10 mg by mouth at bedtime.   Yes [provider]  famotidine (PEPCID) 20 MG tablet Take 20 mg by mouth 2 (two) times daily.   Yes [provider]  ferrous sulfate 325 (65 FE) MG tablet Take 325 mg by mouth daily with breakfast.   Yes [provider]  fluticasone (FLONASE) 50 MCG/ACT nasal spray Place 2 sprays into both nostrils daily.    Yes [provider]  guaiFENesin (ROBITUSSIN) 100 MG/5ML liquid Take 200 mg by mouth every 6 (six) hours as needed for cough.   Yes [provider]  guaiFENesin-dextromethorphan (ROBITUSSIN DM) 100-10 MG/5ML syrup Take 5 mLs by mouth every 4 (four) hours as needed for cough. 03/04/17  Yes [name], MD  hydrocortisone (ANUSOL-HC) 25 MG suppository Place 1 suppository (25 mg total) rectally 2 (two) times daily. Patient taking differently: Place 25 mg rectally 2 (two) times daily as needed for hemorrhoids.  04/30/19  Yes Kathlen Mody, MD  INCRUSE ELLIPTA 62.5 MCG/INH AEPB Inhale 1 puff into the lungs daily.  04/17/19  Yes [provider]  ipratropium-albuterol (DUONEB) 0.5-2.5 (3) MG/3ML SOLN Take 3 mLs by nebulization every 6 (six) hours as needed. Patient taking differently: Take 3 mLs by nebulization 4 (four) times daily.  04/21/18  Yes Berton Mount I, MD  lamoTRIgine (LAMICTAL) 200 MG tablet Take 200 mg by mouth 2 (two) times daily.   Yes [provider]  lidocaine (ASPERCREME W/LIDOCAINE) 4 % cream Apply 1 application  topically 3 (three) times daily as needed (for pain).    Yes [provider]  LORazepam (ATIVAN) 1 MG tablet Take 1 tablet (1 mg total) by mouth 2 (two) times daily. 10/16/19  Yes Elgergawy, Leana Roe, MD  magnesium hydroxide (MILK OF MAGNESIA) 400 MG/5ML suspension Take 30 mLs by mouth at bedtime as needed for mild constipation.   Yes [provider]  magnesium oxide (MAG-OX) 400 (241.3 Mg) MG tablet Take 400 mg by mouth daily.   Yes [provider]  montelukast (SINGULAIR) 10 MG tablet Take 10 mg by mouth at bedtime.   Yes [provider]  nystatin (NYSTATIN) powder Apply 1 Bottle topically 2 (two) times daily as needed (itchy rash).   Yes [provider]  pantoprazole (PROTONIX) 20 MG tablet Take 20 mg by mouth daily. 04/17/19  Yes [provider]  polyethylene glycol (MIRALAX / GLYCOLAX) 17 g packet Take 17 g by  mouth 2 (two) times daily. Patient taking differently: Take 17 g by mouth daily as needed for mild constipation.  04/30/19  Yes Kathlen Mody, MD  potassium chloride (KLOR-CON) 20 MEQ packet Take 20 mEq by mouth daily.   Yes [provider]  QUEtiapine (SEROQUEL) 100 MG tablet Take 100-150 mg by mouth See admin instructions. Taking 1 tablet ( ) in the morning and 1 & 1/2 tablet ( ) every evening   Yes [provider]  senna-docusate (SENOKOT-S) 8.6-50 MG per tablet Take 1 tablet by mouth 2 (two) times daily.   Yes [provider]  torsemide (DEMADEX) 20 MG tablet Take 3 tablets (60 mg total) by mouth daily. 10/16/19  Yes Elgergawy, Leana Roe, MD  LORazepam (ATIVAN) 0.5 MG tablet Take 1 tablet (0.5 mg total) by mouth daily as needed for anxiety. 10/16/19   Elgergawy, Leana Roe, MD  potassium chloride (KLOR-CON) 10 MEQ tablet Take 1 tablet (10 mEq total) by mouth daily for 5 days. Patient not taking: Reported on 04/13/2020 01/17/20 01/22/20  Couture, Cortni S, PA-C    Current Facility-Administered Medications    Medication Dose Route Frequency Provider Last Rate Last Admin  . 0.9 %  sodium chloride infusion   Intravenous Continuous Eduard Clos, MD 50 mL/hr at 04/14/20 0806 New Bag at 04/14/20 0806  . acetaminophen (TYLENOL) tablet 650 mg  650 mg Oral Q6H PRN Eduard Clos, MD       Or  . acetaminophen (TYLENOL) suppository 650 mg  650 mg Rectal Q6H PRN Eduard Clos, MD      . albuterol (VENTOLIN HFA) 108 (90 Base) MCG/ACT inhaler 2 puff  2 puff Inhalation Q4H PRN Eduard Clos, MD      . fluticasone (FLONASE) 50 MCG/ACT nasal spray 2 spray  2 spray Each Nare Daily Eduard Clos, MD      . hydrALAZINE (APRESOLINE) injection 10 mg  10 mg Intravenous Q4H PRN Eduard Clos, MD      . lamoTRIgine (LAMICTAL) tablet 200 mg  200 mg Oral BID Eduard Clos, MD   200 mg at 04/14/20 0117  . LORazepam (ATIVAN) tablet 0.5 mg  0.5 mg Oral Daily PRN Eduard Clos, MD      . LORazepam (ATIVAN) tablet 1 mg  1 mg Oral BID Eduard Clos, MD   1 mg at 04/14/20 0117  . mometasone-formoterol (DULERA) 200-5 MCG/ACT inhaler 2 puff  2 puff Inhalation BID Eduard Clos, MD   2 puff at 04/14/20 0122  . pantoprazole (PROTONIX) injection 40 mg  40 mg Intravenous Q12H Eduard Clos, MD      . QUEtiapine (SEROQUEL) tablet 100 mg  100 mg Oral Daily Eduard Clos, MD      . QUEtiapine (SEROQUEL) tablet 150 mg  150 mg Oral QHS Eduard Clos, MD      . zinc oxide 20 % ointment   Topical PRN Eduard Clos, MD        Allergies as of 04/13/2020 - Review Complete 04/13/2020  Allergen Reaction Noted  . Aminoglycosides Other (See Comments) 05/06/2014  . Aspirin Other (See Comments) 01/07/2011  . Codeine Other (See Comments) 01/07/2011  . Ivp dye [iodinated diagnostic agents] Other (See Comments) 01/07/2011  . Morphine and related Other (See Comments) 12/25/2010  . Neomycin Other (See Comments) 01/07/2011  . Penicillins Rash and Other (See  Comments) 12/25/2010  . Phenothiazines Other (See Comments) 05/06/2014  . Promethazine Other (See Comments) 12/25/2010  .  Sulfa antibiotics Other (See Comments) 12/25/2010  . Tetracyclines & related Other (See Comments) 12/25/2010    Family History  Problem Relation Age of Onset  . Emphysema Mother   . Cancer Other     Social History   Socioeconomic History  . Marital status: Divorced    Spouse name: Not on file  . Number of children: 2  . Years of education: 7 th  . Highest education level: Not on file  Occupational History  . Occupation: retired    Comment: retired  Tobacco Use  . Smoking status: Former Smoker    Packs/day: 0.50    Years: 2.00    Pack years: 1.00    Types: Cigarettes    Quit date: 05/28/1959    Years since quitting: 60.9  . Smokeless tobacco: Former Neurosurgeon    Types: Snuff    Quit date: 05/28/1959  . Tobacco comment: Pt used snuff for 33 years  Vaping Use  . Vaping Use: Never used  Substance and Sexual Activity  . Alcohol use: No  . Drug use: No  . Sexual activity: Never  Other Topics Concern  . Not on file  Social History Narrative   Patient lives [facility].   Retired   Education 7 th grade   Right handed.   Caffeine one cup of coffee daily   Social Determinants of Health   Financial Resource Strain:   . Difficulty of Paying Living Expenses: Not on file  Food Insecurity:   . Worried About Programme researcher, broadcasting/film/video in the Last Year: Not on file  . Ran Out of Food in the Last Year: Not on file  Transportation Needs:   . Lack of Transportation (Medical): Not on file  . Lack of Transportation (Non-Medical): Not on file  Physical Activity:   . Days of Exercise per Week: Not on file  . Minutes of Exercise per Session: Not on file  Stress:   . Feeling of Stress : Not on file  Social Connections:   . Frequency of Communication with Friends and Family: Not on file  . Frequency of Social Gatherings with Friends and Family: Not on file    . Attends Religious Services: Not on file  . Active Member of Clubs or Organizations: Not on file  . Attends Banker Meetings: Not on file  . Marital Status: Not on file  Intimate Partner Violence:   . Fear of Current or Ex-Partner: Not on file  . Emotionally Abused: Not on file  . Physically Abused: Not on file  . Sexually Abused: Not on file    Review of Systems: ROS is O/W negative except as mentioned in HPI.  Physical Exam: Vital signs in last 24 hours: Temp:  [98.1 F (36.7 C)-98.6 F (37 C)] 98.6 F (37 C) (11/19 0822) Pulse Rate:  [75-95] 79 (11/19 0822) Resp:  [13-22] 17 (11/19 0822) BP: (117-173)/(58-116) 129/116 (11/19 0822) SpO2:  [90 %-100 %] 93 % (11/19 1610)   General:  Alert, Well-developed, well-nourished, pleasant and cooperative in NAD Head:  Normocephalic and atraumatic. Eyes:  Sclera clear, no icterus.  Conjunctiva pink. Ears:  Normal auditory acuity. Mouth:  No deformity or lesions.   Lungs:  Clear throughout to auscultation.  No wheezes, crackles, or rhonchi.  Heart:  Regular rate and rhythm; no murmurs, clicks, rubs, or gallops. Abdomen:  Soft, non-distended.  BS present.  Non-tender.   Rectal:  Inflammation seen in perianal area.  DRE did not reveal hard  stool in the rectum.  No rectal masses.  Dark black stool noted on exam glove that is heme positive.  Msk:  Symmetrical without gross deformities. Pulses:  Normal pulses noted. Extremities:  Erythema noted on calves B/L. Neurologic:  Alert and  oriented x4;  grossly normal neurologically. Skin:  Intact without significant lesions or rashes. Psych:  Alert and cooperative. Normal mood and affect.  Lab Results: Recent Labs    04/13/20 2048 04/14/20 0124 04/14/20 0404  WBC 12.5* 9.4 9.3  HGB 12.6 11.4* 11.2*  HCT 37.7 34.3* 33.1*  PLT 259 230 236   BMET Recent Labs    04/13/20 2048  NA 135  K 3.8  CL 94*  CO2 25  GLUCOSE 143*  BUN 19  CREATININE 1.09*  CALCIUM 9.4    LFT Recent Labs    04/13/20 2048  PROT 8.7*  ALBUMIN 4.4  AST 38  ALT 27  ALKPHOS 149*  BILITOT 0.6    Studies/Results: CT Abdomen Pelvis Wo Contrast  Result Date: 04/13/2020 CLINICAL DATA:  Abdominal pain EXAM: CT ABDOMEN AND PELVIS WITHOUT CONTRAST TECHNIQUE: Multidetector CT imaging of the abdomen and pelvis was performed following the standard protocol without IV contrast. COMPARISON:  10/31/2016 FINDINGS: Lower chest: Lung bases are clear. No effusions. Heart is normal size. Hepatobiliary: Cysts scattered throughout the liver, unchanged. Prior cholecystectomy. Pancreas: No focal abnormality or ductal dilatation. Spleen: No focal abnormality.  Normal size. Adrenals/Urinary Tract: No adrenal abnormality. No focal renal abnormality. No stones or hydronephrosis. Urinary bladder is unremarkable. Stomach/Bowel: There is at the upper abdominal midline ventral hernia which contains the distal stomach and proximal duodenum. No evidence of bowel obstruction. Moderate stool in the rectum. Vascular/Lymphatic: No evidence of aneurysm or adenopathy. Aortic calcifications. Reproductive: Prior hysterectomy.  No adnexal masses. Other: No free fluid or free air. Musculoskeletal: No acute bony abnormality. IMPRESSION: Moderate stool in the rectum.  Remainder of the colon unremarkable. Upper abdominal midline ventral hernia containing the distal stomach and proximal duodenum, unchanged since prior study. Aortic atherosclerosis. Stable hepatic cysts. No acute findings. Electronically Signed   By: Charlett NoseKevin  Dover M.D.   On: 04/13/2020 21:19   IMPRESSION:  *GI bleed:  She says that she takes iron every day but I do not see it on her medication list.  Melena on exam and is FOBT +.  BUN is normal.  Hgb 11.2 grams, which is down just a little bit from baseline.  Says that stools have ben this way for about a week. *Chronic antiplatelet use with Plavix for unknown diagnosis *Constipation:  Moderate stool in colon  seen on CT scan. *Hemorrhoids:  Painful, uses hydrocortisone/preparation H at home.  PLAN: -EGD later this AM to rule out UGI bleeding source. -Will have the nurse staff give her a SMOG enema later today. -Will order hydrocortisone cream for perianal area. -Monitor Hgb and transfuse as indicated. -Agree with pantoprazole 40 mg IV BID for now.   Princella PellegriniJessica D. Zehr  04/14/2020, 8:51 AM    Attending physician's note   I have taken a history, examined the patient and reviewed the chart. I agree with the Advanced Practitioner's note, impression and recommendations.  79 year old female with history of hypertension, chronic constipation, large hemorrhoids presented with FOBT positive dark stool and ?  Small-volume rectal bleeding On chronic antiplatelet therapy with Plavix Hemoglobin slightly lower compared to baseline  Plan to proceed with EGD to exclude possible upper GI source of bleeding, erosive gastritis duodenitis or peptic ulcer disease Continue  to monitor hemoglobin daily and transfuse as needed below 7  Further recommendation based on findings on EGD  The risks and benefits as well as alternatives of endoscopic procedure(s) have been discussed and reviewed. All questions answered. The patient agrees to proceed.   The patient was provided an opportunity to ask questions and all were answered. The patient agreed with the plan and demonstrated an understanding of the instructions.  Iona Beard , MD [phone]

## 2020-04-14 NOTE — H&P (Signed)
History and Physical    Cindy Padilla ZOX:[id number] DOB: April 13, 1941 DOA: 04/13/2020  PCP: Ron Parker, MD  Patient coming from: Skilled nursing facility.  Chief Complaint: Rectal bleeding.  HPI: Cindy Padilla is a 79 y.o. female with known history of hypertension, bipolar disorder, hyperlipidemia, COPD try to disimpact her stools at the skilled facility when patient stated she started bleeding likely from the hemorrhoids.  Since patient was on Plavix patient was transferred to the ER.  Patient states she was having some constipation try some MiraLAX and then tried suppository which did not give much resolved and patient tried to disimpact when she started bleeding.  Denies any abdominal pain nausea vomiting denies taking any NSAIDs.  Per GI consult done in December 2020 patient has had history of diverticulosis to previous sigmoidoscopy.  I do not have access to these results.  ED Course: In the ER CT abdomen pelvis shows moderate stool burden.  ER physician did a rectal exam and had large amount of tarry black stools concerning for GI bleed and given that patient is on Plavix admitted for further observation.  Labs show hemoglobin of 12.6 WBC count of 12.5 creatinine of 1.09.  Covid test was negative.  Review of Systems: As per HPI, rest all negative.   Past Medical History:  Diagnosis Date  . Arthritis   . Bipolar 1 disorder (HCC)   . Bronchitis   . Chronic back pain   . Constipation   . COPD (chronic obstructive pulmonary disease) (HCC)   . Dementia (HCC)   . Diabetes mellitus   . GERD (gastroesophageal reflux disease)   . Hiatal hernia   . Hypertension   . IBS (irritable bowel syndrome)     Past Surgical History:  Procedure Laterality Date  . gallbladder    . MANDIBLE SURGERY    . PARTIAL HYSTERECTOMY       reports that she quit smoking about 60 years ago. Her smoking use included cigarettes. She has a 1.00 pack-year smoking history. She quit smokeless  tobacco use about 60 years ago.  Her smokeless tobacco use included snuff. She reports that she does not drink alcohol and does not use drugs.  Allergies  Allergen Reactions  . Aminoglycosides Other (See Comments)    Reaction:  Unknown   . Aspirin Other (See Comments)    Reaction:  Unknown   . Codeine Other (See Comments)    Reaction:  Unknown   . Ivp Dye [Iodinated Diagnostic Agents] Other (See Comments)    Reaction:  Unknown   . Morphine And Related Other (See Comments)    Reaction:  Unknown   . Neomycin Other (See Comments)    Reaction:  Unknown   . Penicillins Rash and Other (See Comments)    Has patient had a PCN reaction causing immediate rash, facial/tongue/throat swelling, SOB or lightheadedness with hypotension: No Has patient had a PCN reaction causing severe rash involving mucus membranes or skin necrosis: No Has patient had a PCN reaction that required hospitalization No Has patient had a PCN reaction occurring within the last 10 years: No If all of the above answers are "NO", then may proceed with Cephalosporin use.  Marland Kitchen Phenothiazines Other (See Comments)    Reaction:  Unknown   . Promethazine Other (See Comments)    Reaction:  Unknown   . Sulfa Antibiotics Other (See Comments)    Reaction:  Unknown   . Tetracyclines & Related Other (See Comments)    Reaction: Unknown  Family History  Problem Relation Age of Onset  . Emphysema Mother   . Cancer Other     Prior to Admission medications   Medication Sig Start Date End Date Taking? Authorizing Provider  acetaminophen (TYLENOL) 325 MG tablet Take 650 mg by mouth 3 (three) times daily.   Yes [provider]  albuterol (PROVENTIL HFA;VENTOLIN HFA) 108 (90 BASE) MCG/ACT inhaler Inhale 2 puffs into the lungs every 4 (four) hours as needed for wheezing or shortness of breath.   Yes [provider]  alum & mag hydroxide-simeth (GERI-LANTA) 200-200-20 MG/5ML suspension Take 30 mLs by mouth 4 (four)  times daily as needed for indigestion or heartburn.    Yes [provider]  amLODipine (NORVASC) 5 MG tablet Take 5 mg by mouth 2 (two) times daily.    Yes [provider]  antiseptic oral rinse (BIOTENE) LIQD 15 mLs by Mouth Rinse route at bedtime.   Yes [provider]  atorvastatin (LIPITOR) 40 MG tablet Take 40 mg by mouth daily.    Yes [provider]  bisacodyl (DULCOLAX) 10 MG suppository Place 10 mg rectally 3 (three) times daily as needed for moderate constipation.   Yes [provider]  budesonide-formoterol (SYMBICORT) 160-4.5 MCG/ACT inhaler Inhale 2 puffs into the lungs 2 (two) times daily.    Yes [provider]  Cholecalciferol (VITAMIN D) 50 MCG (2000 UT) tablet Take 2,000 Units by mouth daily.    Yes [provider]  clopidogrel (PLAVIX) 75 MG tablet Take 75 mg by mouth daily.    Yes [provider]  dicyclomine (BENTYL) 10 MG capsule Take 10 mg by mouth 4 (four) times daily.   Yes [provider]  donepezil (ARICEPT) 10 MG tablet Take 10 mg by mouth at bedtime.   Yes [provider]  famotidine (PEPCID) 20 MG tablet Take 20 mg by mouth 2 (two) times daily.   Yes [provider]  ferrous sulfate 325 (65 FE) MG tablet Take 325 mg by mouth daily with breakfast.   Yes [provider]  fluticasone (FLONASE) 50 MCG/ACT nasal spray Place 2 sprays into both nostrils daily.    Yes [provider]  guaiFENesin (ROBITUSSIN) 100 MG/5ML liquid Take 200 mg by mouth every 6 (six) hours as needed for cough.   Yes [provider]  guaiFENesin-dextromethorphan (ROBITUSSIN DM) 100-10 MG/5ML syrup Take 5 mLs by mouth every 4 (four) hours as needed for cough. 03/04/17  Yes Osvaldo ShipperKrishnan, Gokul, MD  hydrocortisone (ANUSOL-HC) 25 MG suppository Place 1 suppository (25 mg total) rectally 2 (two) times daily. Patient taking differently: Place 25 mg rectally 2 (two) times daily as needed  for hemorrhoids.  04/30/19  Yes Kathlen ModyAkula, Vijaya, MD  INCRUSE ELLIPTA 62.5 MCG/INH AEPB Inhale 1 puff into the lungs daily.  04/17/19  Yes [provider]  ipratropium-albuterol (DUONEB) 0.5-2.5 (3) MG/3ML SOLN Take 3 mLs by nebulization every 6 (six) hours as needed. Patient taking differently: Take 3 mLs by nebulization 4 (four) times daily.  04/21/18  Yes Berton Mountgbata, Sylvester I, MD  lamoTRIgine (LAMICTAL) 200 MG tablet Take 200 mg by mouth 2 (two) times daily.   Yes [provider]  lidocaine (ASPERCREME W/LIDOCAINE) 4 % cream Apply 1 application topically 3 (three) times daily as needed (for pain).    Yes [provider]  LORazepam (ATIVAN) 0.5 MG tablet Take 1 tablet (0.5 mg total) by mouth daily as needed for anxiety. 10/16/19  Yes Elgergawy, Leana Roeawood S, MD  LORazepam (ATIVAN) 1 MG tablet Take 1 tablet (1 mg total) by mouth 2 (two) times daily. 10/16/19  Yes Elgergawy, Leana Roe, MD  magnesium hydroxide (MILK OF MAGNESIA) 400 MG/5ML suspension Take 30 mLs by mouth at bedtime as needed for mild constipation.   Yes [provider]  magnesium oxide (MAG-OX) 400 (241.3 Mg) MG tablet Take 400 mg by mouth daily.   Yes [provider]  montelukast (SINGULAIR) 10 MG tablet Take 10 mg by mouth at bedtime.   Yes [provider]  nystatin (NYSTATIN) powder Apply 1 Bottle topically 2 (two) times daily as needed (itchy rash).   Yes [provider]  pantoprazole (PROTONIX) 20 MG tablet Take 20 mg by mouth daily. 04/17/19  Yes [provider]  polyethylene glycol (MIRALAX / GLYCOLAX) 17 g packet Take 17 g by mouth 2 (two) times daily. Patient taking differently: Take 17 g by mouth daily as needed for mild constipation.  04/30/19  Yes Kathlen Mody, MD  potassium chloride (KLOR-CON) 20 MEQ packet Take 20 mEq by mouth daily.   Yes [provider]  QUEtiapine (SEROQUEL) 100 MG tablet Take 100-150 mg by mouth See admin instructions. Taking 1  tablet (100mg ) in the morning and 1 & 1/2 tablet (150mg ) every evening   Yes [provider]  senna-docusate (SENOKOT-S) 8.6-50 MG per tablet Take 1 tablet by mouth 2 (two) times daily.   Yes [provider]  torsemide (DEMADEX) 20 MG tablet Take 3 tablets (60 mg total) by mouth daily. 10/16/19  Yes Elgergawy, 09-02-1978, MD  potassium chloride (KLOR-CON) 10 MEQ tablet Take 1 tablet (10 mEq total) by mouth daily for 5 days. Patient not taking: Reported on 04/13/2020 01/17/20 01/22/20  01/19/20, PA-C    Physical Exam: Constitutional: Moderately built and nourished. Vitals:   04/13/20 1902 04/13/20 1930 04/13/20 2000 04/13/20 2210  BP: (!) 143/69 135/63 (!) 143/64 129/70  Pulse: 94 89 89 84  Resp:  16  16  SpO2: 94% 93% 100% 94%   Eyes: Anicteric no pallor. ENMT: No discharge from the ears eyes nose or mouth. Neck: No mass felt.  No neck rigidity. Respiratory: No rhonchi or crepitations. Cardiovascular: S1-S2 heard. Abdomen: Soft nontender bowel sounds present. Musculoskeletal: No edema. Skin: No rash. Neurologic: Alert awake oriented to time place and person.  Moves all extremities. Psychiatric: Appears normal.  Normal affect.   Labs on Admission: I have personally reviewed following labs and imaging studies  CBC: Recent Labs  Lab 04/13/20 2048  WBC 12.5*  NEUTROABS 9.7*  HGB 12.6  HCT 37.7  MCV 90.0  PLT 259   Basic Metabolic Panel: Recent Labs  Lab 04/13/20 2048  NA 135  K 3.8  CL 94*  CO2 25  GLUCOSE 143*  BUN 19  CREATININE 1.09*  CALCIUM 9.4   GFR: CrCl cannot be calculated (Unknown ideal weight.). Liver Function Tests: Recent Labs  Lab 04/13/20 2048  AST 38  ALT 27  ALKPHOS 149*  BILITOT 0.6  PROT 8.7*  ALBUMIN 4.4   No results for input(s): LIPASE, AMYLASE in the last 168 hours. No results for input(s): AMMONIA in the last 168 hours. Coagulation Profile: No results for input(s): INR, PROTIME in the last 168  hours. Cardiac Enzymes: No results for input(s): CKTOTAL, CKMB, CKMBINDEX, TROPONINI in the last 168 hours. BNP (last 3 results) No results for input(s): PROBNP in the last 8760 hours. HbA1C: No results for input(s): HGBA1C in the last 72 hours.  CBG: No results for input(s): GLUCAP in the last 168 hours. Lipid Profile: No results for input(s): CHOL, HDL, LDLCALC, TRIG, CHOLHDL, LDLDIRECT in the last 72 hours. Thyroid Function Tests: No results for input(s): TSH, T4TOTAL, FREET4, T3FREE, THYROIDAB in the last 72 hours. Anemia Panel: No results for input(s): VITAMINB12, FOLATE, FERRITIN, TIBC, IRON, RETICCTPCT in the last 72 hours. Urine analysis:    Component Value Date/Time   COLORURINE STRAW (A) 10/15/2019 1646   APPEARANCEUR CLEAR 10/15/2019 1646   LABSPEC 1.004 (L) 10/15/2019 1646   PHURINE 8.0 10/15/2019 1646   GLUCOSEU NEGATIVE 10/15/2019 1646   HGBUR NEGATIVE 10/15/2019 1646   BILIRUBINUR NEGATIVE 10/15/2019 1646   KETONESUR NEGATIVE 10/15/2019 1646   PROTEINUR NEGATIVE 10/15/2019 1646   UROBILINOGEN 0.2 09/19/2014 1633   NITRITE NEGATIVE 10/15/2019 1646   LEUKOCYTESUR NEGATIVE 10/15/2019 1646   Sepsis Labs: @LABRCNTIP (procalcitonin:4,lacticidven:4) ) Recent Results (from the past 240 hour(s))  Resp Panel by RT-PCR (Flu A&B, Covid) Nasopharyngeal Swab     Status: None   Collection Time: 04/13/20  9:50 PM   Specimen: Nasopharyngeal Swab; Nasopharyngeal(NP) swabs in vial transport medium  Result Value Ref Range Status   SARS Coronavirus 2 by RT PCR NEGATIVE NEGATIVE Final    Comment: (NOTE) SARS-CoV-2 target nucleic acids are NOT DETECTED.  The SARS-CoV-2 RNA is generally detectable in upper respiratory specimens during the acute phase of infection. The lowest concentration of SARS-CoV-2 viral copies this assay can detect is 138 copies/mL. A negative result does not preclude SARS-Cov-2 infection and should not be used as the sole basis for treatment or other  patient management decisions. A negative result may occur with  improper specimen collection/handling, submission of specimen other than nasopharyngeal swab, presence of viral mutation(s) within the areas targeted by this assay, and inadequate number of viral copies(<138 copies/mL). A negative result must be combined with clinical observations, patient history, and epidemiological information. The expected result is Negative.  Fact Sheet for Patients:  04/15/20  Fact Sheet for Healthcare Providers:  BloggerCourse.com  This test is no t yet approved or cleared by the [employer] FDA and  has been authorized for detection and/or diagnosis of SARS-CoV-2 by FDA under an Emergency Use Authorization (EUA). This EUA will remain  in effect (meaning this test can be used) for the duration of the COVID-19 declaration under Section 564(b)(1) of the Act, 21 U.S.C.section 360bbb-3(b)(1), unless the authorization is terminated  or revoked sooner.       Influenza A by PCR NEGATIVE NEGATIVE Final   Influenza B by PCR NEGATIVE NEGATIVE Final    Comment: (NOTE) The Xpert Xpress SARS-CoV-2/FLU/RSV plus assay is intended as an aid in the diagnosis of influenza from Nasopharyngeal swab specimens and should not be used as a sole basis for treatment. Nasal washings and aspirates are unacceptable for Xpert Xpress SARS-CoV-2/FLU/RSV testing.  Fact Sheet for Patients: [place]  Fact Sheet for Healthcare Providers: BloggerCourse.com  This test is not yet approved or cleared by the [employer] FDA and has been authorized for detection and/or diagnosis of SARS-CoV-2 by FDA under an Emergency Use Authorization (EUA). This EUA will remain in effect (meaning this test can be used) for the duration of the COVID-19 declaration under Section 564(b)(1) of the Act, 21 U.S.C. section  360bbb-3(b)(1), unless the authorization is terminated or revoked.  Performed at [facility], [address] [place], [place]      Radiological Exams on Admission: CT Abdomen Pelvis Wo Contrast  Result Date:  04/13/2020 CLINICAL DATA:  Abdominal pain EXAM: CT ABDOMEN AND PELVIS WITHOUT CONTRAST TECHNIQUE: Multidetector CT imaging of the abdomen and pelvis was performed following the standard protocol without IV contrast. COMPARISON:  10/31/2016 FINDINGS: Lower chest: Lung bases are clear. No effusions. Heart is normal size. Hepatobiliary: Cysts scattered throughout the liver, unchanged. Prior cholecystectomy. Pancreas: No focal abnormality or ductal dilatation. Spleen: No focal abnormality.  Normal size. Adrenals/Urinary Tract: No adrenal abnormality. No focal renal abnormality. No stones or hydronephrosis. Urinary bladder is unremarkable. Stomach/Bowel: There is at the upper abdominal midline ventral hernia which contains the distal stomach and proximal duodenum. No evidence of bowel obstruction. Moderate stool in the rectum. Vascular/Lymphatic: No evidence of aneurysm or adenopathy. Aortic calcifications. Reproductive: Prior hysterectomy.  No adnexal masses. Other: No free fluid or free air. Musculoskeletal: No acute bony abnormality. IMPRESSION: Moderate stool in the rectum.  Remainder of the colon unremarkable. Upper abdominal midline ventral hernia containing the distal stomach and proximal duodenum, unchanged since prior study. Aortic atherosclerosis. Stable hepatic cysts. No acute findings. Electronically Signed   By: Charlett Nose M.D.   On: 04/13/2020 21:19     Assessment/Plan Principal Problem:   Acute GI bleeding Active Problems:   HTN (hypertension)   Chronic diastolic CHF (congestive heart failure) (HCC)   Dementia with behavioral disturbance (HCC)   Dyskinesia, drug-induced   COPD with chronic bronchitis (HCC)    1. Rectal bleeding -ER patient  states that patient had dark tarry stools on rectal exam.  Since patient is on Plavix we will check serial CBC keep patient on Protonix for now and consult GI.  N.p.o. except medications. 2. Hypertension we will keep patient on as needed IV hydralazine and replacing it taking oral medications. 3. Bipolar disorder on Lamictal and Seroquel. 4. History of diastolic CHF appears compensated.   DVT prophylaxis: SCDs.  Avoiding anticoagulation due to GI bleed. Code Status: DNR confirmed with patient. Family Communication: Discussed with patient. Disposition Plan: Back to facility when stable. Consults called: We will send message to on-call GI. Admission status: Observation.   Eduard Clos MD [facility] Pager [phone].  If 7PM-7AM, please contact night-coverage [email or web address] Password [email or web address]  04/14/2020, 12:06 AM

## 2020-04-14 NOTE — Transfer of Care (Signed)
Immediate Anesthesia Transfer of Care Note  Patient: Cindy Padilla  Procedure(s) Performed: Procedure(s): ESOPHAGOGASTRODUODENOSCOPY (EGD) WITH PROPOFOL (N/A)  Patient Location: PACU  Anesthesia Type:MAC  Level of Consciousness:  sedated, patient cooperative and responds to stimulation  Airway & Oxygen Therapy:Patient Spontanous Breathing and Patient connected to face mask oxgen  Post-op Assessment:  Report given to PACU RN and Post -op Vital signs reviewed and stable  Post vital signs:  Reviewed and stable  Last Vitals:  Vitals:   04/14/20 1144 04/14/20 1147  BP: (!) 141/49   Pulse: 75   Resp: 18   Temp:  37.1 C  SpO2: 83%     Complications: No apparent anesthesia complications

## 2020-04-14 NOTE — Anesthesia Preprocedure Evaluation (Addendum)
Anesthesia Evaluation  Patient identified by MRN, date of birth, ID band Patient awake    Reviewed: Allergy & Precautions, NPO status , Patient's Chart, lab work & pertinent test results  Airway Mallampati: II       Dental  (+) Teeth Intact, Implants   Pulmonary asthma , COPD,  COPD inhaler, former smoker,    Pulmonary exam normal        Cardiovascular hypertension, Pt. on medications Normal cardiovascular exam     Neuro/Psych PSYCHIATRIC DISORDERS Bipolar Disorder Dementia    GI/Hepatic Neg liver ROS, GERD  Medicated,  Endo/Other  diabetes  Renal/GU Renal InsufficiencyRenal disease  negative genitourinary   Musculoskeletal   Abdominal (+) + obese,   Peds  Hematology  (+) Blood dyscrasia, anemia ,   Anesthesia Other Findings IMPRESSIONS    1. Left ventricular ejection fraction, by estimation, is 65 to 70%. The  left ventricle has normal function. The left ventricle has no regional  wall motion abnormalities. There is mild concentric left ventricular  hypertrophy. Left ventricular diastolic  parameters are consistent with Grade I diastolic dysfunction (impaired  relaxation). Elevated left atrial pressure.  2. Right ventricular systolic function is normal. The right ventricular  size is normal.  3. The mitral valve is normal in structure. No evidence of mitral valve  regurgitation. No evidence of mitral stenosis.  4. The aortic valve is normal in structure. Aortic valve regurgitation is  mild. Moderate aortic valve stenosis. Aortic valve mean gradient measures  35.0 mmHg.  5. The inferior vena cava is normal in size with greater than 50%  respiratory variability, suggesting right atrial pressure of 3 mmHg.   Reproductive/Obstetrics                           Anesthesia Physical Anesthesia Plan  ASA: III  Anesthesia Plan: MAC   Post-op Pain Management:    Induction:   PONV  Risk Score and Plan: Propofol infusion  Airway Management Planned:   Additional Equipment: None  Intra-op Plan:   Post-operative Plan:   Informed Consent: I have reviewed the patients History and Physical, chart, labs and discussed the procedure including the risks, benefits and alternatives for the proposed anesthesia with the patient or authorized representative who has indicated his/her understanding and acceptance.       Plan Discussed with: CRNA  Anesthesia Plan Comments:         Anesthesia Quick Evaluation

## 2020-04-14 NOTE — Op Note (Signed)
[facility] Patient Name: Cindy KindleChristine Padilla Procedure Date: 04/14/2020 MRN: [id number] Attending MD: Napoleon FormKavitha V. Weronika Birch , MD Date of Birth: 1940/07/02 CSN: [id number] Age: [age] Admit Type: Inpatient Procedure:                Upper GI endoscopy Indications:              Active gastrointestinal bleeding, Suspected upper                            gastrointestinal bleeding, Gastrointestinal                            bleeding of unknown origin Providers:                Napoleon FormKavitha V. Saraiah Bhat, MD, Margaree MackintoshHayleigh Westmoreland, RN,                            Leanne LovelyAllison Townsend, Technician, Greig RightLogan Key, CRNA Referring MD:              Medicines:                Monitored Anesthesia Care Complications:            No immediate complications. Estimated Blood Loss:     Estimated blood loss was minimal. Procedure:                Pre-Anesthesia Assessment:                           - Prior to the procedure, a History and Physical                            was performed, and patient medications and                            allergies were reviewed. The patient's tolerance of                            previous anesthesia was also reviewed. The risks                            and benefits of the procedure and the sedation                            options and risks were discussed with the patient.                            All questions were answered, and informed consent                            was obtained. Prior Anticoagulants: The patient                            last took Plavix (clopidogrel) 1 day prior to the  procedure. ASA Grade Assessment: III - A patient                            with severe systemic disease. After reviewing the                            risks and benefits, the patient was deemed in                            satisfactory condition to undergo the procedure.                           After obtaining informed consent, the endoscope  was                            passed under direct vision. Throughout the                            procedure, the patient's blood pressure, pulse, and                            oxygen saturations were monitored continuously. The                            GIF-H190 ([id number]) Olympus gastroscope was                            introduced through the mouth, and advanced to the                            second part of duodenum. The upper GI endoscopy was                            technically difficult and complex due to a large                            hiatal hernia and ventral hernia, J-shaped stomach                            which made pyloric intubation difficult. Successful                            completion of the procedure was aided by applying                            abdominal pressure. The patient tolerated the                            procedure well. Scope In: Scope Out: Findings:      LA Grade C (one or more mucosal breaks continuous between tops of 2 or       more mucosal folds, less than 75% circumference) esophagitis with no       bleeding was found 34 to 35 cm from  the incisors.      A few dispersed less than 5 mm Cameron's erosion erosions with no       stigmata of recent bleeding were found in the gastric body and cardia.      A large hiatal hernia was present.      The duodenal bulb, first portion of the duodenum and second portion of       the duodenum were normal. Impression:               - LA Grade C reflux esophagitis with no bleeding.                           - Erosive gastropathy with no stigmata of recent                            bleeding.                           - Large hiatal hernia.                           - Normal duodenal bulb, first portion of the                            duodenum and second portion of the duodenum.                           - No specimens collected. Moderate Sedation:      Not Applicable - Patient had care per  Anesthesia. Recommendation:           - Patient has a contact number available for                            emergencies. The signs and symptoms of potential                            delayed complications were discussed with the                            patient. Return to normal activities tomorrow.                            Written discharge instructions were provided to the                            patient.                           - Resume previous diet.                           - Continue present medications.                           - Follow an antireflux regimen.                           -  Use Protonix (pantoprazole) 40 mg PO BID X 2                            months followed by Protonix 40mg  daily                           - Monitor Hgb daily and transfuse if below 7                           - If no further bleeding with stable Hgb, ok To                            discharge from hospital tomorrow.                           - Ok to restart Plavix in 5 days from GI                            standpoint, if continued use is indicated                           - GI is available if needed during this weekend,                            please call with any questions Procedure Code(s):        --- Professional ---                           [phone], Esophagogastroduodenoscopy, flexible,                            transoral; diagnostic, including collection of                            specimen(s) by brushing or washing, when performed                            (separate procedure) Diagnosis Code(s):        --- Professional ---                           K21.00, Gastro-esophageal reflux disease with                            esophagitis, without bleeding                           K31.89, Other diseases of stomach and duodenum                           K44.9, Diaphragmatic hernia without obstruction or                            gangrene  K92.2,  Gastrointestinal hemorrhage, unspecified CPT copyright 2019 American Medical Association. All rights reserved. The codes documented in this report are preliminary and upon coder review may  be revised to meet current compliance requirements. Napoleon Form, MD 04/14/2020 12:42:04 PM This report has been signed electronically. Number of Addenda: 0

## 2020-04-15 DIAGNOSIS — J449 Chronic obstructive pulmonary disease, unspecified: Secondary | ICD-10-CM | POA: Diagnosis not present

## 2020-04-15 DIAGNOSIS — K922 Gastrointestinal hemorrhage, unspecified: Secondary | ICD-10-CM | POA: Diagnosis not present

## 2020-04-15 LAB — GLUCOSE, CAPILLARY
Glucose-Capillary: 117 mg/dL — ABNORMAL HIGH (ref 70–99)
Glucose-Capillary: 84 mg/dL (ref 70–99)
Glucose-Capillary: 123 mg/dL — ABNORMAL HIGH (ref 70–99)
Glucose-Capillary: 122 mg/dL — ABNORMAL HIGH (ref 70–99)

## 2020-04-15 LAB — CBC
HCT: 33.3 % — ABNORMAL LOW (ref 36.0–46.0)
Hemoglobin: 10.9 g/dL — ABNORMAL LOW (ref 12.0–15.0)
MCH: 29.9 pg (ref 26.0–34.0)
MCHC: 32.7 g/dL (ref 30.0–36.0)
MCV: 91.5 fL (ref 80.0–100.0)
Platelets: 195 10*3/uL (ref 150–400)
RBC: 3.64 MIL/uL — ABNORMAL LOW (ref 3.87–5.11)
RDW: 14.2 % (ref 11.5–15.5)
WBC: 9.5 10*3/uL (ref 4.0–10.5)
nRBC: 0 % (ref 0.0–0.2)

## 2020-04-15 MED ORDER — AMLODIPINE BESYLATE 5 MG PO TABS
5.0000 mg | ORAL_TABLET | Freq: Two times a day (BID) | ORAL | Status: DC
  Administered 2020-04-15 – 2020-04-17 (×4): 5 mg via ORAL
  Filled 2020-04-15 (×5): qty 1

## 2020-04-15 MED ORDER — DONEPEZIL HCL 10 MG PO TABS
10.0000 mg | ORAL_TABLET | Freq: Every day | ORAL | Status: DC
  Administered 2020-04-15 – 2020-04-16 (×2): 10 mg via ORAL
  Filled 2020-04-15 (×3): qty 1

## 2020-04-15 MED ORDER — TORSEMIDE 20 MG PO TABS
60.0000 mg | ORAL_TABLET | Freq: Every day | ORAL | Status: DC
  Administered 2020-04-16 – 2020-04-17 (×2): 60 mg via ORAL
  Filled 2020-04-15 (×2): qty 3

## 2020-04-15 MED ORDER — ATORVASTATIN CALCIUM 40 MG PO TABS
40.0000 mg | ORAL_TABLET | Freq: Every day | ORAL | Status: DC
  Administered 2020-04-15 – 2020-04-17 (×3): 40 mg via ORAL
  Filled 2020-04-15 (×3): qty 1

## 2020-04-15 NOTE — Plan of Care (Signed)
  Problem: Urinary Elimination: Goal: Signs and symptoms of infection will decrease 04/15/2020 1231 by Delcie Roch, RN Outcome: Progressing 04/15/2020 1230 by Delcie Roch, RN Outcome: Progressing 04/15/2020 1229 by Delcie Roch, RN Outcome: Progressing   Problem: Clinical Measurements: Goal: Diagnostic test results will improve Outcome: Progressing   Problem: Activity: Goal: Risk for activity intolerance will decrease Outcome: Progressing   Problem: Elimination: Goal: Will not experience complications related to urinary retention Outcome: Progressing

## 2020-04-15 NOTE — Progress Notes (Addendum)
PROGRESS NOTE    Cindy Padilla  VEH:[id number] DOB: 1940/07/09 DOA: 04/13/2020 PCP: Ron Parker, MD    Brief Narrative:  79 y.o. female with known history of hypertension, bipolar disorder, hyperlipidemia, COPD try to disimpact her stools at the skilled facility when patient stated she started bleeding likely from the hemorrhoids.  Since patient was on Plavix patient was transferred to the ER.  Patient states she was having some constipation try some MiraLAX and then tried suppository which did not give much resolved and patient tried to disimpact when she started bleeding.  Denies any abdominal pain nausea vomiting denies taking any NSAIDs.  Per GI consult done in December 2020 patient has had history of diverticulosis to previous sigmoidoscopy.  Assessment & Plan:   Principal Problem:   Acute GI bleeding Active Problems:   HTN (hypertension)   Chronic diastolic CHF (congestive heart failure) (HCC)   Dementia with behavioral disturbance (HCC)   Dyskinesia, drug-induced   COPD with chronic bronchitis (HCC)   Melena   Heme positive stool   1. Rectal bleeding with chronic normocytic anemia 1. Pt noted to have dark, tarry stools in ED 2. Pt had been on plavix prior to admit, which is held for now 3. CBC trends reviewed. Hgb remains stable at 11.3 which appears to be pt's baseline 4. Appreciate input by GI. Pt underwent EGD with results reviewed. Findings of erosive esophagitis without bleeding. Recommendation for Protonix (pantoprazole) 40 mg PO BID X 2 months followed by Protonix 40mg  daily 5. Hgb has remained stable and pt is medically clear at this time for return to facility. Discussed with TOC. Plan to d/c Monday. 6. Per GI, recommendation to resume plavix in 5 days 2. Hypertension 1. BP stable at present, albeit suboptimally controlled 2. Resume home BP meds 3. Continued on PRN IV hydralazine as needed 3. Bipolar disorder 1. Continue on Lamictal and  Seroquel. 4. History of chronic diastolic CHF 1. Currently appears compensated. 2. Will resume home torsemide  DVT prophylaxis: SCD's Code Status: DNR Family Communication: Pt in room, family not at bedside  Status is: Observation  The patient will require care spanning > 2 midnights and should be moved to inpatient because: Unsafe d/c plan and Inpatient level of care appropriate due to severity of illness  Dispo: The patient is from: ALF              Anticipated d/c is to: ALF              Anticipated d/c date is: 2 days              Patient currently is medically stable to d/c. Pending dispo plan per [facility]  Consultants:   GI  Procedures:   EGD 11/19  Antimicrobials: Anti-infectives (From admission, onward)   None      Subjective: Complained of having "part of my brain removed" during yesterday's EGD resulting in paralysis. Staff reported no issues in baseline strength  Objective: Vitals:   04/14/20 1820 04/14/20 2053 04/15/20 0409 04/15/20 1535  BP: (!) 159/65 (!) 149/69 (!) 152/49 (!) 128/54  Pulse: 72 64 62 73  Resp: 16 15 17 15   Temp: 99 F (37.2 C)  98 F (36.7 C) 98.9 F (37.2 C)  TempSrc: Oral  Oral Oral  SpO2:   92% 96%  Weight:      Height:        Intake/Output Summary (Last 24 hours) at 04/15/2020 1818 Last data filed at 04/15/2020 1758  Gross per 24 hour  Intake 118 ml  Output 750 ml  Net -632 ml   Filed Weights   04/14/20 0950 04/14/20 1144  Weight: 78.8 kg 78.8 kg    Examination: General exam: Appears calm and comfortable  Respiratory system: Clear to auscultation. Respiratory effort normal. Cardiovascular system: S1 & S2 heard, Regular Gastrointestinal system: Abdomen is nondistended, soft and nontender. No organomegaly or masses felt. Normal bowel sounds heard. Central nervous system: Alert and oriented. No focal neurological deficits. Extremities: Symmetric 5 x 5 power. Skin: No rashes, lesions  Psychiatry: Judgement and insight  appear normal. Mood & affect appropriate.   Data Reviewed: I have personally reviewed following labs and imaging studies  CBC: Recent Labs  Lab 04/13/20 2048 04/14/20 0124 04/14/20 0404 04/14/20 1047 04/15/20 0702  WBC 12.5* 9.4 9.3 8.1 9.5  NEUTROABS 9.7*  --   --   --   --   HGB 12.6 11.4* 11.2* 11.3* 10.9*  HCT 37.7 34.3* 33.1* 34.3* 33.3*  MCV 90.0 91.0 90.4 91.5 91.5  PLT 259 230 236 231 195   Basic Metabolic Panel: Recent Labs  Lab 04/13/20 2048  NA 135  K 3.8  CL 94*  CO2 25  GLUCOSE 143*  BUN 19  CREATININE 1.09*  CALCIUM 9.4   GFR: Estimated Creatinine Clearance: 38.2 mL/min (A) (by C-G formula based on SCr of 1.09 mg/dL (H)). Liver Function Tests: Recent Labs  Lab 04/13/20 2048  AST 38  ALT 27  ALKPHOS 149*  BILITOT 0.6  PROT 8.7*  ALBUMIN 4.4   No results for input(s): LIPASE, AMYLASE in the last 168 hours. No results for input(s): AMMONIA in the last 168 hours. Coagulation Profile: No results for input(s): INR, PROTIME in the last 168 hours. Cardiac Enzymes: No results for input(s): CKTOTAL, CKMB, CKMBINDEX, TROPONINI in the last 168 hours. BNP (last 3 results) No results for input(s): PROBNP in the last 8760 hours. HbA1C: No results for input(s): HGBA1C in the last 72 hours. CBG: Recent Labs  Lab 04/14/20 1711 04/15/20 0120 04/15/20 0604 04/15/20 1158 04/15/20 1730  GLUCAP 106* 117* 84 123* 122*   Lipid Profile: No results for input(s): CHOL, HDL, LDLCALC, TRIG, CHOLHDL, LDLDIRECT in the last 72 hours. Thyroid Function Tests: No results for input(s): TSH, T4TOTAL, FREET4, T3FREE, THYROIDAB in the last 72 hours. Anemia Panel: No results for input(s): VITAMINB12, FOLATE, FERRITIN, TIBC, IRON, RETICCTPCT in the last 72 hours. Sepsis Labs: No results for input(s): PROCALCITON, LATICACIDVEN in the last 168 hours.  Recent Results (from the past 240 hour(s))  Resp Panel by RT-PCR (Flu A&B, Covid) Nasopharyngeal Swab     Status: None    Collection Time: 04/13/20  9:50 PM   Specimen: Nasopharyngeal Swab; Nasopharyngeal(NP) swabs in vial transport medium  Result Value Ref Range Status   SARS Coronavirus 2 by RT PCR NEGATIVE NEGATIVE Final    Comment: (NOTE) SARS-CoV-2 target nucleic acids are NOT DETECTED.  The SARS-CoV-2 RNA is generally detectable in upper respiratory specimens during the acute phase of infection. The lowest concentration of SARS-CoV-2 viral copies this assay can detect is 138 copies/mL. A negative result does not preclude SARS-Cov-2 infection and should not be used as the sole basis for treatment or other patient management decisions. A negative result may occur with  improper specimen collection/handling, submission of specimen other than nasopharyngeal swab, presence of viral mutation(s) within the areas targeted by this assay, and inadequate number of viral copies(<138 copies/mL). A negative result must be combined  with clinical observations, patient history, and epidemiological information. The expected result is Negative.  Fact Sheet for Patients:  BloggerCourse.com  Fact Sheet for Healthcare Providers:  SeriousBroker.it  This test is no t yet approved or cleared by the [place] FDA and  has been authorized for detection and/or diagnosis of SARS-CoV-2 by FDA under an Emergency Use Authorization (EUA). This EUA will remain  in effect (meaning this test can be used) for the duration of the COVID-19 declaration under Section 564(b)(1) of the Act, 21 U.S.C.section 360bbb-3(b)(1), unless the authorization is terminated  or revoked sooner.       Influenza A by PCR NEGATIVE NEGATIVE Final   Influenza B by PCR NEGATIVE NEGATIVE Final    Comment: (NOTE) The Xpert Xpress SARS-CoV-2/FLU/RSV plus assay is intended as an aid in the diagnosis of influenza from Nasopharyngeal swab specimens and should not be used as a sole basis for treatment.  Nasal washings and aspirates are unacceptable for Xpert Xpress SARS-CoV-2/FLU/RSV testing.  Fact Sheet for Patients: BloggerCourse.com  Fact Sheet for Healthcare Providers: SeriousBroker.it  This test is not yet approved or cleared by the [place] FDA and has been authorized for detection and/or diagnosis of SARS-CoV-2 by FDA under an Emergency Use Authorization (EUA). This EUA will remain in effect (meaning this test can be used) for the duration of the COVID-19 declaration under Section 564(b)(1) of the Act, 21 U.S.C. section 360bbb-3(b)(1), unless the authorization is terminated or revoked.  Performed at [facility], [address], [place], [place] [address]      Radiology Studies: CT Abdomen Pelvis Wo Contrast  Result Date: 04/13/2020 CLINICAL DATA:  Abdominal pain EXAM: CT ABDOMEN AND PELVIS WITHOUT CONTRAST TECHNIQUE: Multidetector CT imaging of the abdomen and pelvis was performed following the standard protocol without IV contrast. COMPARISON:  10/31/2016 FINDINGS: Lower chest: Lung bases are clear. No effusions. Heart is normal size. Hepatobiliary: Cysts scattered throughout the liver, unchanged. Prior cholecystectomy. Pancreas: No focal abnormality or ductal dilatation. Spleen: No focal abnormality.  Normal size. Adrenals/Urinary Tract: No adrenal abnormality. No focal renal abnormality. No stones or hydronephrosis. Urinary bladder is unremarkable. Stomach/Bowel: There is at the upper abdominal midline ventral hernia which contains the distal stomach and proximal duodenum. No evidence of bowel obstruction. Moderate stool in the rectum. Vascular/Lymphatic: No evidence of aneurysm or adenopathy. Aortic calcifications. Reproductive: Prior hysterectomy.  No adnexal masses. Other: No free fluid or free air. Musculoskeletal: No acute bony abnormality. IMPRESSION: Moderate stool in the rectum.  Remainder of the  colon unremarkable. Upper abdominal midline ventral hernia containing the distal stomach and proximal duodenum, unchanged since prior study. Aortic atherosclerosis. Stable hepatic cysts. No acute findings. Electronically Signed   By: Charlett Nose M.D.   On: 04/13/2020 21:19    Scheduled Meds:  fluticasone  2 spray Each Nare Daily   hydrocortisone   Rectal BID   lamoTRIgine  200 mg Oral BID   LORazepam  1 mg Oral BID   magnesium citrate  1 Bottle Oral Once   mometasone-formoterol  2 puff Inhalation BID   pantoprazole (PROTONIX) IV  40 mg Intravenous Q12H   QUEtiapine  100 mg Oral Daily   QUEtiapine  150 mg Oral QHS   sorbitol, milk of mag, mineral oil, glycerin (SMOG) enema  960 mL Rectal Once   Continuous Infusions:  lactated ringers Stopped (04/14/20 1300)     LOS: 1 day   Rickey Barbara, MD [facility] Pager On Amion  If 7PM-7AM, please  contact night-coverage 04/15/2020, 6:18 PM

## 2020-04-15 NOTE — TOC Initial Note (Addendum)
Transition of Care [facility]) - Initial/Assessment Note    Patient Details  Name: Cindy Padilla MRN: [id number] Date of Birth: March 05, 1941  [facility]) CM/SW Contact:    Ida Rogue, LCSW Phone Number: 04/15/2020, 9:50 AM  Clinical Narrative:   Alerted by MD that patient is stable for return to [facility] ALF.  Called [phone], left message asking them to return my call.  Will tlry again later to see if someone answers as call back is not likely. TOC will continue to follow during the course of hospitalization.  Addendum:  Left another message                 Expected Discharge Plan: Assisted Living Barriers to Discharge: Other (comment) (weekend return to ALF facility)   Patient Goals and CMS Choice        Expected Discharge Plan and Services Expected Discharge Plan: Assisted Living                                              Prior Living Arrangements/Services                       Activities of Daily Living Home Assistive Devices/Equipment: Blood pressure cuff, Grab bars around toilet, Hand-held shower hose, Grab bars in shower, Hospital bed, Nebulizer, Scales, Environmental consultant (specify type), Other (Comment) (front wheeled walker-[facility] will get necessary equipment for their patients) ADL Screening (condition at time of admission) Patient's cognitive ability adequate to safely complete daily activities?: No Is the patient deaf or have difficulty hearing?: No Does the patient have difficulty seeing, even when wearing glasses/contacts?: No Does the patient have difficulty concentrating, remembering, or making decisions?: Yes Patient able to express need for assistance with ADLs?: Yes Does the patient have difficulty dressing or bathing?: No Independently performs ADLs?: Yes (appropriate for developmental age) Does the patient have difficulty walking or climbing stairs?: Yes Weakness of Legs: Both Weakness of Arms/Hands:  None  Permission Sought/Granted                  Emotional Assessment              Admission diagnosis:  Acute GI bleeding [K92.2] Patient Active Problem List   Diagnosis Date Noted  . Melena   . Heme positive stool   . Acute GI bleeding 04/13/2020  . Near syncope 10/15/2019  . 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  . BRBPR (bright red blood per rectum) 04/28/2019  . Acute exacerbation of chronic obstructive pulmonary disease (COPD) (HCC) 02/26/2017  . COPD with acute exacerbation (HCC) 02/24/2017  . Bipolar 1 disorder (HCC) 02/24/2017  . Normocytic anemia 02/24/2017  . Morbid obesity (HCC) 04/14/2015  . Upper airway cough syndrome 04/14/2015  . Ventral hernia 04/24/2014  . Constipation 04/24/2014  . Hemorrhoids 04/22/2014  . Abnormality of gait 09/30/2013  . 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 Chronic diastolic CHF (congestive heart failure) (HCC) 07/27/2013  . Dementia with behavioral disturbance (HCC) 07/27/2013  . Weakness 02/09/2013  . Hyponatremia 02/09/2013  . Shortness of breath 02/09/2013  . HTN (hypertension) 02/09/2013  . Other and unspecified hyperlipidemia 02/09/2013   PCP:  Ron Parker, MD Pharmacy:  No Pharmacies Listed    Social Determinants of Health (SDOH) Interventions    Readmission Risk Interventions No  flowsheet data found.

## 2020-04-16 DIAGNOSIS — F0391 Unspecified dementia with behavioral disturbance: Secondary | ICD-10-CM

## 2020-04-16 DIAGNOSIS — K922 Gastrointestinal hemorrhage, unspecified: Secondary | ICD-10-CM | POA: Diagnosis not present

## 2020-04-16 DIAGNOSIS — I5032 Chronic diastolic (congestive) heart failure: Secondary | ICD-10-CM

## 2020-04-16 DIAGNOSIS — K209 Esophagitis, unspecified without bleeding: Secondary | ICD-10-CM

## 2020-04-16 DIAGNOSIS — J449 Chronic obstructive pulmonary disease, unspecified: Secondary | ICD-10-CM | POA: Diagnosis not present

## 2020-04-16 DIAGNOSIS — R262 Difficulty in walking, not elsewhere classified: Secondary | ICD-10-CM

## 2020-04-16 LAB — CBC
HCT: 33.3 % — ABNORMAL LOW (ref 36.0–46.0)
Hemoglobin: 10.8 g/dL — ABNORMAL LOW (ref 12.0–15.0)
MCH: 30.1 pg (ref 26.0–34.0)
MCHC: 32.4 g/dL (ref 30.0–36.0)
MCV: 92.8 fL (ref 80.0–100.0)
Platelets: 213 10*3/uL (ref 150–400)
RBC: 3.59 MIL/uL — ABNORMAL LOW (ref 3.87–5.11)
RDW: 14.2 % (ref 11.5–15.5)
WBC: 8.8 10*3/uL (ref 4.0–10.5)
nRBC: 0 % (ref 0.0–0.2)

## 2020-04-16 LAB — GLUCOSE, CAPILLARY
Glucose-Capillary: 122 mg/dL — ABNORMAL HIGH (ref 70–99)
Glucose-Capillary: 127 mg/dL — ABNORMAL HIGH (ref 70–99)
Glucose-Capillary: 129 mg/dL — ABNORMAL HIGH (ref 70–99)
Glucose-Capillary: 131 mg/dL — ABNORMAL HIGH (ref 70–99)
Glucose-Capillary: 136 mg/dL — ABNORMAL HIGH (ref 70–99)

## 2020-04-16 MED ORDER — DOCUSATE SODIUM 100 MG PO CAPS
200.0000 mg | ORAL_CAPSULE | Freq: Every day | ORAL | Status: DC
  Administered 2020-04-16 – 2020-04-17 (×2): 200 mg via ORAL
  Filled 2020-04-16 (×2): qty 2

## 2020-04-16 MED ORDER — SIMETHICONE 80 MG PO CHEW
80.0000 mg | CHEWABLE_TABLET | Freq: Once | ORAL | Status: AC
  Administered 2020-04-16: 80 mg via ORAL
  Filled 2020-04-16: qty 1

## 2020-04-16 MED ORDER — POLYETHYLENE GLYCOL 3350 17 G PO PACK: 17.0000 g | PACK | Freq: Two times a day (BID) | ORAL | Status: DC | PRN

## 2020-04-16 NOTE — TOC Progression Note (Signed)
Transition of Care [facility]) - Progression Note    Patient Details  Name: Cindy Padilla MRN: [id number] Date of Birth: 1941/03/21  [facility] CM/SW Contact  Coralyn Helling, [place] Phone Number: 04/16/2020, 12:26 PM  Clinical Narrative:   Called facility to facilitate patients return to ALF. No answer at [phone]. LCSW will continue to call facility to facilitate patient dc.     Expected Discharge Plan: Assisted Living Barriers to Discharge: Other (comment) (weekend return to ALF facility)  Expected Discharge Plan and Services Expected Discharge Plan: Assisted Living                                               Social Determinants of Health (SDOH) Interventions    Readmission Risk Interventions No flowsheet data found.

## 2020-04-16 NOTE — Progress Notes (Signed)
PROGRESS NOTE  Cindy Padilla WVP:[id number] DOB: 12-11-1940   PCP: Ron Parker, MD  Patient is from: ALF  DOA: 04/13/2020 LOS: 2  Chief complaints: GI bleed  Brief Narrative / Interim history: 79 year old female with history of bipolar disorder, diastolic CHF, COPD, HTN, HLD, ambulatory dysfunction diverticulosis, and hemorrhoid brought to ED from SNF out of concern for GI bleed after attempted disimpaction of stools for constipation since patient is on Plavix.   In ED, CT abdomen and pelvis revealed moderate stool burden.  Had large amount of tarry black stool on rectal exam.  Hgb 12.6 (about baseline).  WBC 12.5.  GI consulted, and she was admitted.  Patient underwent EGD on 11/19 that revealed large grade C reflux esophagitis with no bleeding, erosive gastropathy with no stigmata of recent bleeding, large hiatal hernia and normal duodenum.  GI recommended Protonix 40 mg twice daily for 2 months followed by 40 mg daily.  Per GI, okay to resume Plavix in 5 days  Medically stable to return to ALF, likely on 04/17/2020.  Subjective: Seen and examined earlier this morning.  No major events overnight or this morning.  No specific complaints.  Denies chest pain, dyspnea, lightheadedness, GI or UTI symptoms.  Objective: Vitals:   04/15/20 2012 04/16/20 0614 04/16/20 0737 04/16/20 1322  BP: (!) 130/55 140/73  (!) 140/58  Pulse: 70 70  66  Resp: 16 18  18   Temp: 98.5 F (36.9 C) 98.2 F (36.8 C)  98.9 F (37.2 C)  TempSrc: Oral Oral  Oral  SpO2: 90% 91% 93% 93%  Weight:      Height:        Intake/Output Summary (Last 24 hours) at 04/16/2020 1639 Last data filed at 04/16/2020 1415 Gross per 24 hour  Intake 0 ml  Output 1150 ml  Net -1150 ml   Filed Weights   04/14/20 0950 04/14/20 1144  Weight: 78.8 kg 78.8 kg    Examination:  GENERAL: No apparent distress.  Nontoxic. HEENT: MMM.  Vision and hearing grossly intact.  NECK: Supple.  No apparent JVD.  RESP:  No  IWOB.  Fair aeration bilaterally. CVS:  RRR. Heart sounds normal.  ABD/GI/GU: BS+. Abd soft, NTND.  MSK/EXT:  Moves extremities. No apparent deformity.  RLE larger than LLE (chronic) SKIN: no apparent skin lesion or wound NEURO: Awake, alert and oriented appropriately.  No apparent focal neuro deficit. PSYCH: Calm. Normal affect.  Procedures:  11/19-EGD revealed large grade C reflux esophagitis with no bleeding, erosive gastropathy with no stigmata of recent bleeding, large hiatal hernia and normal duodenum.  Microbiology summarized: COVID-19, influenza and RSV PCR nonreactive.  Assessment & Plan: Rectal bleed: Likely hemorrhoidal in the setting of attempted disimpaction for constipation.  EGD as above.  H&H relatively stable after initial drop, 12.6 (admit)>> 11.2>>> 10.8. -Check anemia panel -Protonix 40 mg twice daily for 2 months followed by 40 mg daily per GI recommendation -Okay to resume Plavix in 5 days from 11/19 per GI. -Bowel regimen to avoid constipation -Continue soft diet  Chronic diastolic CHF: TTE in 09/2019 with EF of 65 to 70%, mild concentric LVH, G1-DD.  Appears euvolemic.  On Demadex at home. -Continue home Demadex -Monitor fluid status and renal functions.  Chronic COPD: Stable. -Continue home breathing treatments  Essential hypertension: Stable -Continue home Demadex  Bipolar disorder: Stable. -Continue home Seroquel, Lamictal and Ativan.  History of dementia? -Continue home Aricept.   Constipation -Scheduled Colace -As needed MiraLAX  Hemorrhoid: -Continue suppositories.  Body mass index is 33.93 kg/m.         DVT prophylaxis:  SCDs Start: 04/14/20 0003  Code Status: DNR/DNI Family Communication: Patient and/or RN. Available if any question.  Status is: Inpatient  Remains inpatient appropriate because:Unsafe d/c plan   Dispo: The patient is from: ALF              Anticipated d/c is to: ALF              Anticipated d/c date is: 1  day              Patient currently is medically stable to d/c.       Consultants:  GI   Sch Meds:  Scheduled Meds: . amLODipine  5 mg Oral BID  . atorvastatin  40 mg Oral Daily  . docusate sodium  200 mg Oral Daily  . donepezil  10 mg Oral QHS  . fluticasone  2 spray Each Nare Daily  . hydrocortisone   Rectal BID  . lamoTRIgine  200 mg Oral BID  . LORazepam  1 mg Oral BID  . magnesium citrate  1 Bottle Oral Once  . mometasone-formoterol  2 puff Inhalation BID  . pantoprazole (PROTONIX) IV  40 mg Intravenous Q12H  . QUEtiapine  100 mg Oral Daily  . QUEtiapine  150 mg Oral QHS  . sorbitol, milk of mag, mineral oil, glycerin (SMOG) enema  960 mL Rectal Once  . torsemide  60 mg Oral Daily   Continuous Infusions: . lactated ringers Stopped (04/14/20 1300)   PRN Meds:.acetaminophen **OR** acetaminophen, albuterol, hydrALAZINE, LORazepam, polyethylene glycol, zinc oxide  Antimicrobials: Anti-infectives (From admission, onward)   None       I have personally reviewed the following labs and images: CBC: Recent Labs  Lab 04/13/20 2048 04/13/20 2048 04/14/20 0124 04/14/20 0404 04/14/20 1047 04/15/20 0702 04/16/20 0634  WBC 12.5*   < > 9.4 9.3 8.1 9.5 8.8  NEUTROABS 9.7*  --   --   --   --   --   --   HGB 12.6   < > 11.4* 11.2* 11.3* 10.9* 10.8*  HCT 37.7   < > 34.3* 33.1* 34.3* 33.3* 33.3*  MCV 90.0   < > 91.0 90.4 91.5 91.5 92.8  PLT 259   < > 230 236 231 195 213   < > = values in this interval not displayed.   BMP &GFR Recent Labs  Lab 04/13/20 2048  NA 135  K 3.8  CL 94*  CO2 25  GLUCOSE 143*  BUN 19  CREATININE 1.09*  CALCIUM 9.4   Estimated Creatinine Clearance: 38.2 mL/min (A) (by C-G formula based on SCr of 1.09 mg/dL (H)). Liver & Pancreas: Recent Labs  Lab 04/13/20 2048  AST 38  ALT 27  ALKPHOS 149*  BILITOT 0.6  PROT 8.7*  ALBUMIN 4.4   No results for input(s): LIPASE, AMYLASE in the last 168 hours. No results for input(s): AMMONIA  in the last 168 hours. Diabetic: No results for input(s): HGBA1C in the last 72 hours. Recent Labs  Lab 04/15/20 1158 04/15/20 1730 04/16/20 0015 04/16/20 0605 04/16/20 1154  GLUCAP 123* 122* 127* 122* 136*   Cardiac Enzymes: No results for input(s): CKTOTAL, CKMB, CKMBINDEX, TROPONINI in the last 168 hours. No results for input(s): PROBNP in the last 8760 hours. Coagulation Profile: No results for input(s): INR, PROTIME in the last 168 hours. Thyroid Function Tests: No results for input(s): TSH, T4TOTAL,  FREET4, T3FREE, THYROIDAB in the last 72 hours. Lipid Profile: No results for input(s): CHOL, HDL, LDLCALC, TRIG, CHOLHDL, LDLDIRECT in the last 72 hours. Anemia Panel: No results for input(s): VITAMINB12, FOLATE, FERRITIN, TIBC, IRON, RETICCTPCT in the last 72 hours. Urine analysis:    Component Value Date/Time   COLORURINE STRAW (A) 10/15/2019 1646   APPEARANCEUR CLEAR 10/15/2019 1646   LABSPEC 1.004 (L) 10/15/2019 1646   PHURINE 8.0 10/15/2019 1646   GLUCOSEU NEGATIVE 10/15/2019 1646   HGBUR NEGATIVE 10/15/2019 1646   BILIRUBINUR NEGATIVE 10/15/2019 1646   KETONESUR NEGATIVE 10/15/2019 1646   PROTEINUR NEGATIVE 10/15/2019 1646   UROBILINOGEN 0.2 09/19/2014 1633   NITRITE NEGATIVE 10/15/2019 1646   LEUKOCYTESUR NEGATIVE 10/15/2019 1646   Sepsis Labs: Invalid input(s): PROCALCITONIN, LACTICIDVEN  Microbiology: Recent Results (from the past 240 hour(s))  Resp Panel by RT-PCR (Flu A&B, Covid) Nasopharyngeal Swab     Status: None   Collection Time: 04/13/20  9:50 PM   Specimen: Nasopharyngeal Swab; Nasopharyngeal(NP) swabs in vial transport medium  Result Value Ref Range Status   SARS Coronavirus 2 by RT PCR NEGATIVE NEGATIVE Final    Comment: (NOTE) SARS-CoV-2 target nucleic acids are NOT DETECTED.  The SARS-CoV-2 RNA is generally detectable in upper respiratory specimens during the acute phase of infection. The lowest concentration of SARS-CoV-2 viral copies  this assay can detect is 138 copies/mL. A negative result does not preclude SARS-Cov-2 infection and should not be used as the sole basis for treatment or other patient management decisions. A negative result may occur with  improper specimen collection/handling, submission of specimen other than nasopharyngeal swab, presence of viral mutation(s) within the areas targeted by this assay, and inadequate number of viral copies(<138 copies/mL). A negative result must be combined with clinical observations, patient history, and epidemiological information. The expected result is Negative.  Fact Sheet for Patients:  BloggerCourse.com  Fact Sheet for Healthcare Providers:  SeriousBroker.it  This test is no t yet approved or cleared by the [place] FDA and  has been authorized for detection and/or diagnosis of SARS-CoV-2 by FDA under an Emergency Use Authorization (EUA). This EUA will remain  in effect (meaning this test can be used) for the duration of the COVID-19 declaration under Section 564(b)(1) of the Act, 21 U.S.C.section 360bbb-3(b)(1), unless the authorization is terminated  or revoked sooner.       Influenza A by PCR NEGATIVE NEGATIVE Final   Influenza B by PCR NEGATIVE NEGATIVE Final    Comment: (NOTE) The Xpert Xpress SARS-CoV-2/FLU/RSV plus assay is intended as an aid in the diagnosis of influenza from Nasopharyngeal swab specimens and should not be used as a sole basis for treatment. Nasal washings and aspirates are unacceptable for Xpert Xpress SARS-CoV-2/FLU/RSV testing.  Fact Sheet for Patients: BloggerCourse.com  Fact Sheet for Healthcare Providers: SeriousBroker.it  This test is not yet approved or cleared by the [place] FDA and has been authorized for detection and/or diagnosis of SARS-CoV-2 by FDA under an Emergency Use Authorization (EUA). This EUA  will remain in effect (meaning this test can be used) for the duration of the COVID-19 declaration under Section 564(b)(1) of the Act, 21 U.S.C. section 360bbb-3(b)(1), unless the authorization is terminated or revoked.  Performed at [facility], [address] [place], [place] [address]     Radiology Studies: No results found.   Meral Geissinger T. Mycheal [place] [facility]  If 7PM-7AM, please contact night-coverage [email or web address] 04/16/2020, 4:39 PM

## 2020-04-17 ENCOUNTER — Inpatient Hospital Stay (HOSPITAL_COMMUNITY): Payer: Medicare Other

## 2020-04-17 ENCOUNTER — Encounter (HOSPITAL_COMMUNITY): Payer: Self-pay | Admitting: Gastroenterology

## 2020-04-17 DIAGNOSIS — K649 Unspecified hemorrhoids: Principal | ICD-10-CM

## 2020-04-17 DIAGNOSIS — J449 Chronic obstructive pulmonary disease, unspecified: Secondary | ICD-10-CM | POA: Diagnosis not present

## 2020-04-17 DIAGNOSIS — F0391 Unspecified dementia with behavioral disturbance: Secondary | ICD-10-CM | POA: Diagnosis not present

## 2020-04-17 DIAGNOSIS — I5032 Chronic diastolic (congestive) heart failure: Secondary | ICD-10-CM | POA: Diagnosis not present

## 2020-04-17 DIAGNOSIS — K922 Gastrointestinal hemorrhage, unspecified: Secondary | ICD-10-CM | POA: Diagnosis not present

## 2020-04-17 LAB — RENAL FUNCTION PANEL
Albumin: 3.1 g/dL — ABNORMAL LOW (ref 3.5–5.0)
Anion gap: 13 (ref 5–15)
BUN: 11 mg/dL (ref 8–23)
CO2: 28 mmol/L (ref 22–32)
Calcium: 8.7 mg/dL — ABNORMAL LOW (ref 8.9–10.3)
Chloride: 98 mmol/L (ref 98–111)
Creatinine, Ser: 0.92 mg/dL (ref 0.44–1.00)
GFR, Estimated: >60 mL/min (ref >60)
Glucose, Bld: 122 mg/dL — ABNORMAL HIGH (ref 70–99)
Phosphorus: 3.8 mg/dL (ref 2.5–4.6)
Potassium: 3 mmol/L — ABNORMAL LOW (ref 3.5–5.1)
Sodium: 139 mmol/L (ref 135–145)

## 2020-04-17 LAB — RETICULOCYTES
Immature Retic Fract: 28.3 % — ABNORMAL HIGH (ref 2.3–15.9)
RBC.: 3.48 MIL/uL — ABNORMAL LOW (ref 3.87–5.11)
Retic Count, Absolute: 64.4 10*3/uL (ref 19.0–186.0)
Retic Ct Pct: 1.9 % (ref 0.4–3.1)

## 2020-04-17 LAB — IRON AND TIBC
Iron: 34 ug/dL (ref 28–170)
Saturation Ratios: 12 % (ref 10.4–31.8)
TIBC: 294 ug/dL (ref 250–450)
UIBC: 260 ug/dL

## 2020-04-17 LAB — GLUCOSE, CAPILLARY
Glucose-Capillary: 120 mg/dL — ABNORMAL HIGH (ref 70–99)
Glucose-Capillary: 133 mg/dL — ABNORMAL HIGH (ref 70–99)
Glucose-Capillary: 123 mg/dL — ABNORMAL HIGH (ref 70–99)
Glucose-Capillary: 118 mg/dL — ABNORMAL HIGH (ref 70–99)

## 2020-04-17 LAB — HEMOGLOBIN AND HEMATOCRIT, BLOOD
HCT: 32 % — ABNORMAL LOW (ref 36.0–46.0)
Hemoglobin: 10.4 g/dL — ABNORMAL LOW (ref 12.0–15.0)

## 2020-04-17 LAB — FOLATE: Folate: 13.5 ng/mL (ref >5.9)

## 2020-04-17 LAB — MAGNESIUM: Magnesium: 1.9 mg/dL (ref 1.7–2.4)

## 2020-04-17 LAB — FERRITIN: Ferritin: 118 ng/mL (ref 11–307)

## 2020-04-17 LAB — VITAMIN B12: Vitamin B-12: 118 pg/mL — ABNORMAL LOW (ref 180–914)

## 2020-04-17 MED ORDER — PANTOPRAZOLE SODIUM 40 MG PO TBEC: DELAYED_RELEASE_TABLET | ORAL | 0 refills | Status: DC

## 2020-04-17 MED ORDER — FERROUS SULFATE 325 (65 FE) MG PO TABS: 325.0000 mg | ORAL_TABLET | Freq: Two times a day (BID) | ORAL | 1 refills | Status: AC

## 2020-04-17 MED ORDER — POLYETHYLENE GLYCOL 3350 17 G PO PACK
17.0000 g | PACK | Freq: Two times a day (BID) | ORAL | Status: DC
  Administered 2020-04-17: 17 g via ORAL
  Filled 2020-04-17 (×2): qty 1

## 2020-04-17 MED ORDER — CLOPIDOGREL BISULFATE 75 MG PO TABS: 75.0000 mg | ORAL_TABLET | Freq: Every day | ORAL | Status: AC

## 2020-04-17 MED ORDER — POLYETHYLENE GLYCOL 3350 17 GM/SCOOP PO POWD: 17.0000 g | Freq: Two times a day (BID) | ORAL | 3 refills | Status: DC | PRN

## 2020-04-17 MED ORDER — CLOPIDOGREL BISULFATE 75 MG PO TABS
75.0000 mg | ORAL_TABLET | Freq: Every day | ORAL | Status: DC
Start: 2020-04-24 — End: 2020-04-17

## 2020-04-17 MED ORDER — POTASSIUM CHLORIDE CRYS ER 20 MEQ PO TBCR
40.0000 meq | EXTENDED_RELEASE_TABLET | ORAL | Status: AC
  Administered 2020-04-17 (×2): 40 meq via ORAL
  Filled 2020-04-17 (×2): qty 2

## 2020-04-17 MED ORDER — DICYCLOMINE HCL 20 MG PO TABS
20.0000 mg | ORAL_TABLET | Freq: Once | ORAL | Status: AC
  Administered 2020-04-17: 20 mg via ORAL
  Filled 2020-04-17: qty 1

## 2020-04-17 NOTE — Evaluation (Signed)
Physical Therapy Evaluation Patient Details Name: Cindy Padilla MRN: [id number] DOB: 1941-04-17 Today's Date: 04/17/2020   History of Present Illness  79yo female presenting to the ED with rectal bleeding. Admitted with concerns of acute GIB. PMH bipolar, back pain, COPD, dementia, DM, HTN, IBS, mandible fracture  Clinical Impression   Patient received in bed, pleasant but very impulsive and difficult to get focused, accurate information out of; she states she is questioning if she can take care of herself enough to go home right now. Able to mobilize on a min guard to MinA basis but extremely impulsive and with basically zero safety awareness, at one point pushing RW out of the way and staggering to the [facility] despite PT cues/needed MinA to complete successfully. Very internally distracted and tangential, also with a lot of urinary incontinence today that prevented [place] from really attempting gait. Left in bed with all needs met, bed alarm active and RN present/attending. Will definitely benefit from SNF prior to return home.    Follow Up Recommendations SNF;Supervision/Assistance - 24 hour    Equipment Recommendations  Rolling walker with 5" wheels;3in1 (PT)    Recommendations for Other Services       Precautions / Restrictions Precautions Precautions: Fall;Other (comment) Precaution Comments: very impulsive, very poor safety awareness Restrictions Weight Bearing Restrictions: No      Mobility  Bed Mobility Overal bed mobility: Needs Assistance Bed Mobility: Supine to Sit;Sit to Supine     Supine to sit: Supervision;HOB elevated Sit to supine: Supervision;HOB elevated   General bed mobility comments: increased time and HOB maximally elevated    Transfers Overall transfer level: Needs assistance Equipment used: Rolling walker (2 wheeled) Transfers: Sit to/from [facility] Sit to Stand: Min guard Stand pivot transfers: Min assist       General  transfer comment: min guard to come to full upright position, then became very impulsive and pushed RW to the side, unexpectedly dove for the commode and sat and urinated without pulling panties down  Ambulation/Gait             General Gait Details: deferred- urinary incontinence  Stairs            Wheelchair Mobility    Modified Rankin (Stroke Patients Only)       Balance Overall balance assessment: Needs assistance Sitting-balance support: Bilateral upper extremity supported;Feet supported Sitting balance-Leahy Scale: Good     Standing balance support: No upper extremity supported;During functional activity Standing balance-Leahy Scale: Poor Standing balance comment: impulsive, poor safety awareness                             Pertinent Vitals/Pain Pain Assessment: No/denies pain    Home Living Family/patient expects to be discharged to:: Assisted living               Home Equipment: Walker - 2 wheels;Shower seat;Grab bars - tub/shower Additional Comments: difficult to get information out of- able to clarify that she lives in an ALF and uses a walker but too distractable to really get accurate details otherwise    Prior Function Level of Independence: Independent with assistive device(s)         Comments: some information taken from prior charting- has a history of being ambulatory with RW, I with ADLs but does need help bathing     Hand Dominance        Extremity/Trunk Assessment   Upper Extremity Assessment Upper  Extremity Assessment: Defer to OT evaluation    Lower Extremity Assessment Lower Extremity Assessment: Generalized weakness    Cervical / Trunk Assessment Cervical / Trunk Assessment: Kyphotic  Communication   Communication: No difficulties  Cognition Arousal/Alertness: Awake/alert Behavior During Therapy: Impulsive Overall Cognitive Status: No family/caregiver present to determine baseline cognitive  functioning Area of Impairment: Orientation;Attention;Memory;Following commands;Safety/judgement;Awareness;Problem solving                   Current Attention Level: Sustained Memory: Decreased recall of precautions;Decreased short-term memory Following Commands: Follows one step commands inconsistently;Follows one step commands with increased time Safety/Judgement: Decreased awareness of safety;Decreased awareness of deficits Awareness: Intellectual Problem Solving: Slow processing;Decreased initiation;Difficulty sequencing;Requires verbal cues;Requires tactile cues General Comments: extremely easily distracted today, very impulsive and not following therapist cues for safety. Basically non-existant safety awareness      General Comments      Exercises     Assessment/Plan    PT Assessment Patient needs continued PT services  PT Problem List Decreased strength;Decreased cognition;Decreased knowledge of use of DME;Decreased activity tolerance;Decreased safety awareness;Decreased balance;Decreased mobility;Decreased coordination       PT Treatment Interventions DME instruction;Balance training;Gait training;Stair training;Cognitive remediation;Functional mobility training;Patient/family education;Therapeutic activities;Therapeutic exercise    PT Goals (Current goals can be found in the Care Plan section)  Acute Rehab PT Goals Patient Stated Goal: maybe go to snf PT Goal Formulation: With patient Time For Goal Achievement: 05/01/20 Potential to Achieve Goals: Fair    Frequency Min 2X/week   Barriers to discharge        Co-evaluation               AM-PAC PT "6 Clicks" Mobility  Outcome Measure Help needed turning from your back to your side while in a flat bed without using bedrails?: A Little Help needed moving from lying on your back to sitting on the side of a flat bed without using bedrails?: A Little Help needed moving to and from a bed to a chair  (including a wheelchair)?: A Little Help needed standing up from a chair using your arms (e.g., wheelchair or bedside chair)?: A Little Help needed to walk in hospital room?: A Little Help needed climbing 3-5 steps with a railing? : A Lot 6 Click Score: 17    End of Session   Activity Tolerance: Patient tolerated treatment well Patient left: in bed;with call bell/phone within reach;with bed alarm set (RN present and attending) Nurse Communication: Mobility status PT Visit Diagnosis: Unsteadiness on feet (R26.81);Muscle weakness (generalized) (M62.81)    Time: 1030-1100 PT Time Calculation (min) (ACUTE ONLY): 30 min   Charges:   PT Evaluation $PT Eval Moderate Complexity: 1 Mod PT Treatments $Therapeutic Activity: 8-22 mins        Windell Norfolk, DPT, PN1   Supplemental Physical Therapist [place]    Pager [phone] Acute Rehab Office [phone]

## 2020-04-17 NOTE — Discharge Summary (Addendum)
Physician Discharge Summary  Cindy Padilla DZH:[id number] DOB: 1941-02-06 DOA: 04/13/2020  PCP: Ron Parker, MD  Admit date: 04/13/2020 Discharge date: 04/17/2020  Admitted From: ALF Disposition: ALF  Recommendations for Outpatient Follow-up:  1. Follow ups as below. 2. Please obtain CBC/BMP/Mag at follow up 3. Please follow up on the following pending results: None  Home Health: None Equipment/Devices: None  Discharge Condition: Stable CODE STATUS: DNR/DNI   Hospital Course: 79 year old female with history of bipolar disorder, diastolic CHF, COPD, HTN, HLD, ambulatory dysfunction diverticulosis, and hemorrhoid brought to ED from SNF out of concern for GI bleed after attempted disimpaction of stools for constipation since patient is on Plavix.   In ED, CT abdomen and pelvis revealed moderate stool burden.  Had large amount of tarry black stool on rectal exam.  Hgb 12.6 (about baseline).  WBC 12.5.  GI consulted, and she was admitted.  Patient underwent EGD on 11/19 that revealed large grade C reflux esophagitis with no bleeding, erosive gastropathy with no stigmata of recent bleeding, large hiatal hernia and normal duodenum.  GI recommended Protonix 40 mg twice daily for 2 months followed by 40 mg daily.  Per GI, okay to resume Plavix in 5 days  Hemoglobin remained stable.  Patient has not further rectal bleed.  Discharged back to ALF.   See individual problem list below for more on hospital course.  Discharge Diagnoses:  Rectal bleed/hemorrhoids/esophagitis: Likely hemorrhoidal in the setting of attempted disimpaction for constipation.  EGD as above.  H&H relatively stable after initial drop, 12.6 (admit)>> 11.2>>> 10.8>10.4.  Anemia panel was normal. -Protonix 40 mg twice daily for 2 months followed by 40 mg daily per GI recommendation -Okay to resume Plavix in 5 days from 04/14/2020 per GI. -Bowel regimen to avoid constipation -Continue Anusol H suppositories  for hemorrhoid. -Recheck CBC in 1 week.  Chronic diastolic CHF: TTE in 09/2019 with EF of 65 to 70%, mild concentric LVH, G1-DD.  Appears euvolemic.  On Demadex at home. -Continue home Demadex and K-Dur. -Recheck renal function and electrolytes in a week.  Chronic COPD: Stable. -Continue home breathing treatments  Essential hypertension: Stable -Continue home Demadex and amlodipine.  Bipolar disorder: Stable. -Continue home Seroquel, Lamictal and Ativan.  History of dementia? -Continue home Aricept.   Constipation -MiraLAX and Senokot-S as below.  Hemorrhoid: -Continue supportive care with Anusol H.  Abdominal pain: vague description.  Diffuse tenderness on exam but no rebound or guarding.  KUB reassuring. -Bowel regimen as above  Addendum Hypokalemia: K 3.0.  Likely due to diuretics (torsemide).  Magnesium within normal. -Replenished prior to discharge-K-Dur 40 mEq x 2 -Continue home K-Dur 20 mEq daily.   Body mass index is 33.93 kg/m.            Discharge Exam: Vitals:   04/17/20 0533 04/17/20 0734  BP: (!) 145/54   Pulse: 64   Resp: 18   Temp: 98.4 F (36.9 C)   SpO2: 92% 95%    GENERAL: No apparent distress.  Nontoxic. HEENT: MMM.  Vision and hearing grossly intact.  NECK: Supple.  No apparent JVD.  RESP:  No IWOB.  Fair aeration bilaterally. CVS:  RRR. Heart sounds normal.  ABD/GI/GU: Bowel sounds present. Soft.  Diffuse tenderness.  MSK/EXT:  Moves extremities. No apparent deformity. LLE > RLE (chronic). SKIN: no apparent skin lesion or wound NEURO: Awake, alert and oriented appropriately.  No apparent focal neuro deficit. PSYCH: Calm. Normal affect.  Discharge Instructions  Discharge Instructions  Diet - low sodium heart healthy   Complete by: As directed    Increase activity slowly   Complete by: As directed      Allergies as of 04/17/2020      Reactions   Aminoglycosides Other (See Comments)   Reaction:  Unknown    Aspirin  Other (See Comments)   Reaction:  Unknown    Codeine Other (See Comments)   Reaction:  Unknown    Ivp Dye [iodinated Diagnostic Agents] Other (See Comments)   Reaction:  Unknown    Morphine And Related Other (See Comments)   Reaction:  Unknown    Neomycin Other (See Comments)   Reaction:  Unknown    Penicillins Rash, Other (See Comments)   Has patient had a PCN reaction causing immediate rash, facial/tongue/throat swelling, SOB or lightheadedness with hypotension: No Has patient had a PCN reaction causing severe rash involving mucus membranes or skin necrosis: No Has patient had a PCN reaction that required hospitalization No Has patient had a PCN reaction occurring within the last 10 years: No If all of the above answers are "NO", then may proceed with Cephalosporin use.   Phenothiazines Other (See Comments)   Reaction:  Unknown    Promethazine Other (See Comments)   Reaction:  Unknown    Sulfa Antibiotics Other (See Comments)   Reaction:  Unknown    Tetracyclines & Related Other (See Comments)   Reaction: Unknown       Medication List    STOP taking these medications   bisacodyl 10 MG suppository Commonly known as: DULCOLAX   dicyclomine 10 MG capsule Commonly known as: BENTYL   famotidine 20 MG tablet Commonly known as: PEPCID   guaiFENesin 100 MG/5ML liquid Commonly known as: ROBITUSSIN   guaiFENesin-dextromethorphan 100-10 MG/5ML syrup Commonly known as: ROBITUSSIN DM   polyethylene glycol 17 g packet Commonly known as: MIRALAX / GLYCOLAX Replaced by: polyethylene glycol powder 17 GM/SCOOP powder     TAKE these medications   acetaminophen 325 MG tablet Commonly known as: TYLENOL Take 650 mg by mouth 3 (three) times daily.   albuterol 108 (90 Base) MCG/ACT inhaler Commonly known as: VENTOLIN HFA Inhale 2 puffs into the lungs every 4 (four) hours as needed for wheezing or shortness of breath.   amLODipine 5 MG tablet Commonly known as: NORVASC Take 5 mg  by mouth 2 (two) times daily.   antiseptic oral rinse Liqd 15 mLs by Mouth Rinse route at bedtime.   Aspercreme w/Lidocaine 4 % cream Generic drug: lidocaine Apply 1 application topically 3 (three) times daily as needed (for pain).   atorvastatin 40 MG tablet Commonly known as: LIPITOR Take 40 mg by mouth daily.   budesonide-formoterol 160-4.5 MCG/ACT inhaler Commonly known as: SYMBICORT Inhale 2 puffs into the lungs 2 (two) times daily.   clopidogrel 75 MG tablet Commonly known as: PLAVIX Take 1 tablet (75 mg total) by mouth daily. Start taking on: April 24, 2020 What changed: These instructions start on April 24, 2020. If you are unsure what to do until then, ask your doctor or other care provider.   donepezil 10 MG tablet Commonly known as: ARICEPT Take 10 mg by mouth at bedtime.   ferrous sulfate 325 (65 FE) MG tablet Take 1 tablet (325 mg total) by mouth 2 (two) times daily with a meal. What changed: when to take this   fluticasone 50 MCG/ACT nasal spray Commonly known as: FLONASE Place 2 sprays into both nostrils daily.   Venida JarvisGeri-Lanta  200-200-20 MG/5ML suspension Generic drug: alum & mag hydroxide-simeth Take 30 mLs by mouth 4 (four) times daily as needed for indigestion or heartburn.   hydrocortisone 25 MG suppository Commonly known as: ANUSOL-HC Place 1 suppository (25 mg total) rectally 2 (two) times daily. What changed:   when to take this  reasons to take this   Incruse Ellipta 62.5 MCG/INH Aepb Generic drug: umeclidinium bromide Inhale 1 puff into the lungs daily.   ipratropium-albuterol 0.5-2.5 (3) MG/3ML Soln Commonly known as: DUONEB Take 3 mLs by nebulization every 6 (six) hours as needed. What changed: when to take this   lamoTRIgine 200 MG tablet Commonly known as: LAMICTAL Take 200 mg by mouth 2 (two) times daily.   LORazepam 1 MG tablet Commonly known as: ATIVAN Take 1 tablet (1 mg total) by mouth 2 (two) times daily.     LORazepam 0.5 MG tablet Commonly known as: ATIVAN Take 1 tablet (0.5 mg total) by mouth daily as needed for anxiety.   magnesium hydroxide 400 MG/5ML suspension Commonly known as: MILK OF MAGNESIA Take 30 mLs by mouth at bedtime as needed for mild constipation.   magnesium oxide 400 (241.3 Mg) MG tablet Commonly known as: MAG-OX Take 400 mg by mouth daily.   montelukast 10 MG tablet Commonly known as: SINGULAIR Take 10 mg by mouth at bedtime.   nystatin powder Generic drug: nystatin Apply 1 Bottle topically 2 (two) times daily as needed (itchy rash).   pantoprazole 40 MG tablet Commonly known as: Protonix Take 1 tablet (40 mg total) by mouth 2 (two) times daily for 60 days, THEN 1 tablet (40 mg total) daily. Start taking on: April 17, 2020 What changed:   medication strength  See the new instructions.   polyethylene glycol powder 17 GM/SCOOP powder Commonly known as: MiraLax Take 17 g by mouth 2 (two) times daily as needed for moderate constipation. Replaces: polyethylene glycol 17 g packet   potassium chloride 20 MEQ packet Commonly known as: KLOR-CON Take 20 mEq by mouth daily. What changed: Another medication with the same name was removed. Continue taking this medication, and follow the directions you see here.   QUEtiapine 100 MG tablet Commonly known as: SEROQUEL Take 100-150 mg by mouth See admin instructions. Taking 1 tablet (100mg ) in the morning and 1 & 1/2 tablet (150mg ) every evening   senna-docusate 8.6-50 MG tablet Commonly known as: Senokot-S Take 1 tablet by mouth 2 (two) times daily.   torsemide 20 MG tablet Commonly known as: DEMADEX Take 3 tablets (60 mg total) by mouth daily.   Vitamin D 50 MCG (2000 UT) tablet Take 2,000 Units by mouth daily.       Consultations:  Gastroenterology  Procedures/Studies:  11/19-EGD revealed large grade C reflux esophagitis with no bleeding, erosive gastropathy with no stigmata of recent bleeding,  large hiatal hernia and normal duodenum.   CT Abdomen Pelvis Wo Contrast  Result Date: 04/13/2020 CLINICAL DATA:  Abdominal pain EXAM: CT ABDOMEN AND PELVIS WITHOUT CONTRAST TECHNIQUE: Multidetector CT imaging of the abdomen and pelvis was performed following the standard protocol without IV contrast. COMPARISON:  10/31/2016 FINDINGS: Lower chest: Lung bases are clear. No effusions. Heart is normal size. Hepatobiliary: Cysts scattered throughout the liver, unchanged. Prior cholecystectomy. Pancreas: No focal abnormality or ductal dilatation. Spleen: No focal abnormality.  Normal size. Adrenals/Urinary Tract: No adrenal abnormality. No focal renal abnormality. No stones or hydronephrosis. Urinary bladder is unremarkable. Stomach/Bowel: There is at the upper abdominal midline ventral hernia which  contains the distal stomach and proximal duodenum. No evidence of bowel obstruction. Moderate stool in the rectum. Vascular/Lymphatic: No evidence of aneurysm or adenopathy. Aortic calcifications. Reproductive: Prior hysterectomy.  No adnexal masses. Other: No free fluid or free air. Musculoskeletal: No acute bony abnormality. IMPRESSION: Moderate stool in the rectum.  Remainder of the colon unremarkable. Upper abdominal midline ventral hernia containing the distal stomach and proximal duodenum, unchanged since prior study. Aortic atherosclerosis. Stable hepatic cysts. No acute findings. Electronically Signed   By: Charlett Nose M.D.   On: 04/13/2020 21:19   DG Abd Portable 1V  Result Date: 04/17/2020 CLINICAL DATA:  Diffuse abdominal pain EXAM: PORTABLE ABDOMEN - 1 VIEW COMPARISON:  CT 04/13/2020 FINDINGS: No sign of ileus or obstruction. Amount of fecal matter within normal limits. Ventral hernia shown by CT not appreciable on this one view abdominal radiograph. Aortic atherosclerotic calcification incidentally noted. Curvature in degenerative change of the lumbar spine as seen previously. Chronic osteitis pubis.  IMPRESSION: No acute finding. Amount of fecal matter within normal limits. Ventral hernia shown by CT not appreciable on this one view abdominal radiograph. Electronically Signed   By: Paulina Fusi M.D.   On: 04/17/2020 09:17        The results of significant diagnostics from this hospitalization (including imaging, microbiology, ancillary and laboratory) are listed below for reference.     Microbiology: Recent Results (from the past 240 hour(s))  Resp Panel by RT-PCR (Flu A&B, Covid) Nasopharyngeal Swab     Status: None   Collection Time: 04/13/20  9:50 PM   Specimen: Nasopharyngeal Swab; Nasopharyngeal(NP) swabs in vial transport medium  Result Value Ref Range Status   SARS Coronavirus 2 by RT PCR NEGATIVE NEGATIVE Final    Comment: (NOTE) SARS-CoV-2 target nucleic acids are NOT DETECTED.  The SARS-CoV-2 RNA is generally detectable in upper respiratory specimens during the acute phase of infection. The lowest concentration of SARS-CoV-2 viral copies this assay can detect is 138 copies/mL. A negative result does not preclude SARS-Cov-2 infection and should not be used as the sole basis for treatment or other patient management decisions. A negative result may occur with  improper specimen collection/handling, submission of specimen other than nasopharyngeal swab, presence of viral mutation(s) within the areas targeted by this assay, and inadequate number of viral copies(<138 copies/mL). A negative result must be combined with clinical observations, patient history, and epidemiological information. The expected result is Negative.  Fact Sheet for Patients:  BloggerCourse.com  Fact Sheet for Healthcare Providers:  SeriousBroker.it  This test is no t yet approved or cleared by the [place] FDA and  has been authorized for detection and/or diagnosis of SARS-CoV-2 by FDA under an Emergency Use Authorization (EUA). This EUA  will remain  in effect (meaning this test can be used) for the duration of the COVID-19 declaration under Section 564(b)(1) of the Act, 21 U.S.C.section 360bbb-3(b)(1), unless the authorization is terminated  or revoked sooner.       Influenza A by PCR NEGATIVE NEGATIVE Final   Influenza B by PCR NEGATIVE NEGATIVE Final    Comment: (NOTE) The Xpert Xpress SARS-CoV-2/FLU/RSV plus assay is intended as an aid in the diagnosis of influenza from Nasopharyngeal swab specimens and should not be used as a sole basis for treatment. Nasal washings and aspirates are unacceptable for Xpert Xpress SARS-CoV-2/FLU/RSV testing.  Fact Sheet for Patients: BloggerCourse.com  Fact Sheet for Healthcare Providers: SeriousBroker.it  This test is not yet approved or cleared by the [place]  FDA and has been authorized for detection and/or diagnosis of SARS-CoV-2 by FDA under an Emergency Use Authorization (EUA). This EUA will remain in effect (meaning this test can be used) for the duration of the COVID-19 declaration under Section 564(b)(1) of the Act, 21 U.S.C. section 360bbb-3(b)(1), unless the authorization is terminated or revoked.  Performed at [facility], [address], [place], [place] [address]      Labs: BNP (last 3 results) No results for input(s): BNP in the last 8760 hours. Basic Metabolic Panel: Recent Labs  Lab 04/13/20 2048 04/17/20 0555  NA 135 139  K 3.8 3.0*  CL 94* 98  CO2 25 28  GLUCOSE 143* 122*  BUN 19 11  CREATININE 1.09* 0.92  CALCIUM 9.4 8.7*  MG  --  1.9  PHOS  --  3.8   Liver Function Tests: Recent Labs  Lab 04/13/20 2048 04/17/20 0555  AST 38  --   ALT 27  --   ALKPHOS 149*  --   BILITOT 0.6  --   PROT 8.7*  --   ALBUMIN 4.4 3.1*   No results for input(s): LIPASE, AMYLASE in the last 168 hours. No results for input(s): AMMONIA in the last 168 hours. CBC: Recent Labs    Lab 04/13/20 2048 04/13/20 2048 04/14/20 0124 04/14/20 0124 04/14/20 0404 04/14/20 1047 04/15/20 0702 04/16/20 0634 04/17/20 0555  WBC 12.5*   < > 9.4  --  9.3 8.1 9.5 8.8  --   NEUTROABS 9.7*  --   --   --   --   --   --   --   --   HGB 12.6   < > 11.4*   < > 11.2* 11.3* 10.9* 10.8* 10.4*  HCT 37.7   < > 34.3*   < > 33.1* 34.3* 33.3* 33.3* 32.0*  MCV 90.0   < > 91.0  --  90.4 91.5 91.5 92.8  --   PLT 259   < > 230  --  236 231 195 213  --    < > = values in this interval not displayed.   Cardiac Enzymes: No results for input(s): CKTOTAL, CKMB, CKMBINDEX, TROPONINI in the last 168 hours. BNP: Invalid input(s): POCBNP CBG: Recent Labs  Lab 04/16/20 1154 04/16/20 1748 04/16/20 2317 04/17/20 0530 04/17/20 0756  GLUCAP 136* 131* 129* 120* 133*   D-Dimer No results for input(s): DDIMER in the last 72 hours. Hgb A1c No results for input(s): HGBA1C in the last 72 hours. Lipid Profile No results for input(s): CHOL, HDL, LDLCALC, TRIG, CHOLHDL, LDLDIRECT in the last 72 hours. Thyroid function studies No results for input(s): TSH, T4TOTAL, T3FREE, THYROIDAB in the last 72 hours.  Invalid input(s): FREET3 Anemia work up Recent Labs    04/17/20 0555  VITAMINB12 118*  FOLATE 13.5  FERRITIN 118  TIBC 294  IRON 34  RETICCTPCT 1.9   Urinalysis    Component Value Date/Time   COLORURINE STRAW (A) 10/15/2019 1646   APPEARANCEUR CLEAR 10/15/2019 1646   LABSPEC 1.004 (L) 10/15/2019 1646   PHURINE 8.0 10/15/2019 1646   GLUCOSEU NEGATIVE 10/15/2019 1646   HGBUR NEGATIVE 10/15/2019 1646   BILIRUBINUR NEGATIVE 10/15/2019 1646   KETONESUR NEGATIVE 10/15/2019 1646   PROTEINUR NEGATIVE 10/15/2019 1646   UROBILINOGEN 0.2 09/19/2014 1633   NITRITE NEGATIVE 10/15/2019 1646   LEUKOCYTESUR NEGATIVE 10/15/2019 1646   Sepsis Labs Invalid input(s): PROCALCITONIN,  WBC,  LACTICIDVEN   Time coordinating discharge: 35 minutes  SIGNED:  Almon Hercules, MD  [facility] 04/17/2020, 10:14 AM  If 7PM-7AM, please contact night-coverage [email or web address]

## 2020-04-17 NOTE — Progress Notes (Signed)
This nurse attempted to call report to facility two times. Will let night shift nurse know that this nurse was unsuccessful at reaching facility.

## 2020-04-17 NOTE — Evaluation (Signed)
Occupational Therapy Evaluation Patient Details Name: Cindy Padilla MRN: [id number] DOB: 14-Aug-1940 Today's Date: 04/17/2020    History of Present Illness 79yo female presenting to the ED with rectal bleeding. Admitted with concerns of acute GIB. PMH bipolar, back pain, COPD, dementia, DM, HTN, IBS, mandible fracture   Clinical Impression   Patient is currently requiring assistance with ADLs including Total assist for LE dressing, maximum assist for toileting and LE bathing, and moderate assist for UE dressing as well as supervision and setup for seated grooming and eating, most of which is below patient's typical baseline of being Modified independent with all except bathing and hair styling for which pt is assisted in her ALF.  During this evaluation, patient was limited by cognitive impairments including decreased STM, decreased awareness of situation, and impulsivity as well as generalized weakness and poor safety awareness, which has the potential to impact patient's safety and independence during functional mobility, as well as performance for ADLs. [employer] AM-PAC "6-clicks" Daily Activity Inpatient Short Form score of 13/24 indicates 63.03% ADL impairment this session. Patient demonstrates fair rehab potential, and should benefit from continued skilled occupational therapy services while in acute care to maximize safety, independence and quality of life at home.  Continued occupational therapy services in a SNF setting prior to return home is recommended.  ?     Follow Up Recommendations  Supervision/Assistance - 24 hour;SNF    Equipment Recommendations       Recommendations for Other Services       Precautions / Restrictions Precautions Precautions: Fall;Other (comment) Precaution Comments: very impulsive, very poor safety awareness Restrictions Weight Bearing Restrictions: No      Mobility Bed Mobility Overal bed mobility: Needs Assistance Bed Mobility:  Supine to Sit;Sit to Supine     Supine to sit: Supervision;HOB elevated Sit to supine: Supervision;HOB elevated   General bed mobility comments: increased time/effort, verbal cues for sequencing, and HOB maximally elevated    Transfers Overall transfer level: Needs assistance Equipment used: Rolling walker (2 wheeled) Transfers: Sit to/from Stand Sit to Stand: Min assist Stand pivot transfers: Min assist       General transfer comment: Min As to power up from low EOB.  Pt used RW and took 4 side steps with Min guard assist.    Balance Overall balance assessment: Needs assistance Sitting-balance support: Feet unsupported;No upper extremity supported Sitting balance-Leahy Scale: Good     Standing balance support: Bilateral upper extremity supported;During functional activity Standing balance-Leahy Scale: Poor Standing balance comment: impulsive, poor safety awareness             High level balance activites: Side stepping             ADL either performed or assessed with clinical judgement   ADL Overall ADL's : Needs assistance/impaired Eating/Feeding: Set up;Supervision/ safety Eating/Feeding Details (indicate cue type and reason): Cues for optimal positioning to avoid aspiration. Grooming: Set up;Sitting;Supervision/safety   Upper Body Bathing: Minimal assistance;Sitting;Set up   Lower Body Bathing: Maximal assistance;Bed level;Sitting/lateral leans   Upper Body Dressing : Moderate assistance;Sitting   Lower Body Dressing: Total assistance;Sitting/lateral leans Lower Body Dressing Details (indicate cue type and reason): Total assist to don and doff slide on shoes and socks.   Toilet Transfer Details (indicate cue type and reason): Please refer to Mobility section. Toileting- Clothing Manipulation and Hygiene: Maximal assistance       Functional mobility during ADLs: Minimal assistance;Cueing for sequencing;Cueing for safety;Rolling walker  Vision   Vision Assessment?: No apparent visual deficits     Perception     Praxis      Pertinent Vitals/Pain Pain Assessment: No/denies pain     Hand Dominance Right   Extremity/Trunk Assessment Upper Extremity Assessment Upper Extremity Assessment: Generalized weakness   Lower Extremity Assessment Lower Extremity Assessment: Generalized weakness   Cervical / Trunk Assessment Cervical / Trunk Assessment: Kyphotic   Communication Communication Communication: Other (comment);No difficulties (Tangential)   Cognition Arousal/Alertness: Awake/alert Behavior During Therapy: Impulsive;Restless Overall Cognitive Status: No family/caregiver present to determine baseline cognitive functioning Area of Impairment: Orientation;Attention;Memory;Following commands;Safety/judgement;Awareness;Problem solving                 Orientation Level: Disoriented to;Place;Time;Situation Current Attention Level: Sustained Memory: Decreased recall of precautions;Decreased short-term memory Following Commands: Follows one step commands inconsistently;Follows one step commands with increased time Safety/Judgement: Decreased awareness of safety;Decreased awareness of deficits Awareness: Intellectual Problem Solving: Slow processing;Decreased initiation;Difficulty sequencing;Requires verbal cues;Requires tactile cues General Comments: extremely easily distracted today, very impulsive and not following therapist cues for safety. Basically non-existant safety awareness   General Comments       Exercises     Shoulder Instructions      Home Living Family/patient expects to be discharged to:: Assisted living                             Home Equipment: Walker - 2 wheels;Shower seat;Grab bars - tub/shower   Additional Comments: Pt reports that she lives in "[place]: ALF for 8 years. Pt has disorganized thoughts and requires frequent redirection and increased time to  answer all questions.      Prior Functioning/Environment Level of Independence: Needs assistance  Gait / Transfers Assistance Needed: Furniture cruises in room and RW outside room. ADL's / Homemaking Assistance Needed: Pt is assisted with bathing and hair styling. Pt reports that she wears a diaper brief for urinary incontinence.   Comments: some information taken from prior charting- has a history of being ambulatory with RW, I with ADLs but does need help bathing        OT Problem List: Decreased strength;Increased edema;Decreased activity tolerance;Decreased safety awareness;Impaired balance (sitting and/or standing);Decreased knowledge of use of DME or AE;Obesity;Decreased knowledge of precautions      OT Treatment/Interventions: Self-care/ADL training;Therapeutic exercise;Therapeutic activities;Cognitive remediation/compensation;Energy conservation;DME and/or AE instruction;Patient/family education;Balance training    OT Goals(Current goals can be found in the care plan section) Acute Rehab OT Goals Patient Stated Goal: "Go back to that place today." OT Goal Formulation: With patient ADL Goals Pt Will Perform Upper Body Dressing: with set-up;sitting Pt Will Perform Lower Body Dressing: with set-up;sitting/lateral leans;sit to/from stand;with adaptive equipment Pt Will Transfer to Toilet: with supervision Pt Will Perform Toileting - Clothing Manipulation and hygiene: with supervision;with set-up Pt/caregiver will Perform Home Exercise Program: Increased strength;Both right and left upper extremity;With theraband;With Supervision  OT Frequency: Min 2X/week   Barriers to D/C:    None known       Co-evaluation              AM-PAC OT "6 Clicks" Daily Activity     Outcome Measure Help from another person eating meals?: A Little Help from another person taking care of personal grooming?: A Little Help from another person toileting, which includes using toliet, bedpan, or  urinal?: A Lot Help from another person bathing (including washing, rinsing, drying)?: A Lot Help from another person to  put on and taking off regular upper body clothing?: A Lot Help from another person to put on and taking off regular lower body clothing?: Total 6 Click Score: 13   End of Session Equipment Utilized During Treatment: Gait belt;Rolling walker Nurse Communication: Mobility status  Activity Tolerance: Patient tolerated treatment well Patient left: in bed;with call bell/phone within reach;with bed alarm set;with nursing/sitter in room  OT Visit Diagnosis: Unsteadiness on feet (R26.81);Muscle weakness (generalized) (M62.81);History of falling (Z91.81);Repeated falls (R29.6);Other (comment) (Reports 2 recent falls without injury)                Time: 9357-0177 OT Time Calculation (min): 28 min Charges:  OT General Charges $OT Visit: 1 Visit OT Evaluation $OT Eval Low Complexity: 1 Low OT Treatments $Self Care/Home Management : 8-22 mins  Victorino Dike, OT Acute Rehab Services Office: [phone] 04/17/2020  Theodoro Clock 04/17/2020, 12:15 PM

## 2020-04-17 NOTE — Care Management Important Message (Signed)
Important Message  Patient Details IM Letter given to the Patient Name: Cindy Padilla MRN: [id number] Date of Birth: May 13, 1941   Medicare Important Message Given:  Yes     Caren Macadam 04/17/2020, 10:29 AM

## 2020-04-17 NOTE — NC FL2 (Addendum)
[place] MEDICAID FL2 LEVEL OF CARE SCREENING TOOL     IDENTIFICATION  Patient Name: Cindy Padilla Birthdate: 1940/07/07 Sex: female Admission Date (Current Location): 04/13/2020  [facility] and [place] Number:  Producer, television/film/video and Address:  [facility],  [address] [place] [address]      Provider Number: [id number]  Attending Physician Name and Address:  Almon Hercules, MD  Relative Name and Phone Number:       Current Level of Care: Hospital Recommended Level of Care: Memory Care Prior Approval Number:    Date Approved/Denied:   PASRR Number:    Discharge Plan: Other (Comment) (Assisted Living)    Current Diagnoses: Patient Active Problem List   Diagnosis Date Noted  . Melena   . Heme positive stool   . Acute GI bleeding 04/13/2020  . Near syncope 10/15/2019  . 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  . BRBPR (bright red blood per rectum) 04/28/2019  . Acute exacerbation of chronic obstructive pulmonary disease (COPD) (HCC) 02/26/2017  . COPD with acute exacerbation (HCC) 02/24/2017  . Bipolar 1 disorder (HCC) 02/24/2017  . Normocytic anemia 02/24/2017  . Morbid obesity (HCC) 04/14/2015  . Upper airway cough syndrome 04/14/2015  . Ventral hernia 04/24/2014  . Constipation 04/24/2014  . Hemorrhoids 04/22/2014  . Abnormality of gait 09/30/2013  . 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 Chronic diastolic CHF (congestive heart failure) (HCC) 07/27/2013  . Dementia with behavioral disturbance (HCC) 07/27/2013  . Weakness 02/09/2013  . Hyponatremia 02/09/2013  . Shortness of breath 02/09/2013  . HTN (hypertension) 02/09/2013  . Other and unspecified hyperlipidemia 02/09/2013    Orientation RESPIRATION BLADDER Height & Weight     Self, Time, Situation, Place  Normal Incontinent Weight: 78.8 kg Height:  5' (152.4 cm)  BEHAVIORAL SYMPTOMS/MOOD NEUROLOGICAL BOWEL NUTRITION STATUS       Incontinent Diet (regular)  AMBULATORY STATUS COMMUNICATION OF NEEDS Skin   Limited Assist Verbally Normal                       Personal Care Assistance Level of Assistance  Bathing, Dressing Bathing Assistance: Limited assistance   Dressing Assistance: Limited assistance     Functional Limitations Info  Sight, Hearing Sight Info: Adequate Hearing Info: Adequate      SPECIAL CARE FACTORS FREQUENCY                       Contractures Contractures Info: Not present    Additional Factors Info  Code Status, Allergies Code Status Info: DNR Allergies Info: see MAR           Current Medications (04/17/2020):  This is the current hospital active medication list Current Facility-Administered Medications  Medication Dose Route Frequency Provider Last Rate Last Admin  . acetaminophen (TYLENOL) tablet 650 mg  650 mg Oral Q6H PRN Eduard Clos, MD   650 mg at 04/17/20 2536   Or  . acetaminophen (TYLENOL) suppository 650 mg  650 mg Rectal Q6H PRN Eduard Clos, MD      . albuterol (VENTOLIN HFA) 108 (90 Base) MCG/ACT inhaler 2 puff  2 puff Inhalation Q4H PRN Eduard Clos, MD   2 puff at 04/14/20 1102  . amLODipine (NORVASC) tablet 5 mg  5 mg Oral BID Jerald Kief, MD   5 mg at 04/17/20 1057  . atorvastatin (LIPITOR) tablet 40 mg  40 mg Oral Daily  Jerald Kief, MD   40 mg at 04/17/20 1057  . docusate sodium (COLACE) capsule 200 mg  200 mg Oral Daily Candelaria Stagers T, MD   200 mg at 04/17/20 1057  . donepezil (ARICEPT) tablet 10 mg  10 mg Oral QHS Jerald Kief, MD   10 mg at 04/16/20 2112  . fluticasone (FLONASE) 50 MCG/ACT nasal spray 2 spray  2 spray Each Nare Daily Eduard Clos, MD   2 spray at 04/17/20 1100  . hydrALAZINE (APRESOLINE) injection 10 mg  10 mg Intravenous Q4H PRN Eduard Clos, MD      . hydrocortisone (ANUSOL-HC) 2.5 % rectal cream   Rectal BID Leta Baptist, PA-C   Given at 04/17/20 1058  . lactated ringers  infusion   Intravenous Continuous Napoleon Form, MD   Stopped at 04/14/20 1300  . lamoTRIgine (LAMICTAL) tablet 200 mg  200 mg Oral BID Eduard Clos, MD   200 mg at 04/17/20 1057  . LORazepam (ATIVAN) tablet 0.5 mg  0.5 mg Oral Daily PRN Eduard Clos, MD      . LORazepam (ATIVAN) tablet 1 mg  1 mg Oral BID Eduard Clos, MD   1 mg at 04/17/20 1057  . magnesium citrate solution 1 Bottle  1 Bottle Oral Once Jerald Kief, MD      . mometasone-formoterol [facility]) 200-5 MCG/ACT inhaler 2 puff  2 puff Inhalation BID Eduard Clos, MD   2 puff at 04/17/20 0734  . pantoprazole (PROTONIX) injection 40 mg  40 mg Intravenous Q12H Eduard Clos, MD   40 mg at 04/17/20 1058  . polyethylene glycol (MIRALAX / GLYCOLAX) packet 17 g  17 g Oral BID Candelaria Stagers T, MD   17 g at 04/17/20 1057  . QUEtiapine (SEROQUEL) tablet 100 mg  100 mg Oral Daily Eduard Clos, MD   100 mg at 04/17/20 1057  . QUEtiapine (SEROQUEL) tablet 150 mg  150 mg Oral QHS Eduard Clos, MD   150 mg at 04/16/20 2111  . sorbitol, milk of mag, mineral oil, glycerin (SMOG) enema  960 mL Rectal Once Zehr, Jessica D, PA-C      . torsemide (DEMADEX) tablet 60 mg  60 mg Oral Daily Jerald Kief, MD   60 mg at 04/17/20 1057  . zinc oxide 20 % ointment   Topical PRN Eduard Clos, MD         Discharge Medications: Medication List    STOP taking these medications   bisacodyl 10 MG suppository Commonly known as: DULCOLAX   dicyclomine 10 MG capsule Commonly known as: BENTYL   famotidine 20 MG tablet Commonly known as: PEPCID   guaiFENesin 100 MG/5ML liquid Commonly known as: ROBITUSSIN   guaiFENesin-dextromethorphan 100-10 MG/5ML syrup Commonly known as: ROBITUSSIN DM   polyethylene glycol 17 g packet Commonly known as: MIRALAX / GLYCOLAX Replaced by: polyethylene glycol powder 17 GM/SCOOP powder     TAKE these medications   acetaminophen 325 MG  tablet Commonly known as: TYLENOL Take 650 mg by mouth 3 (three) times daily.   albuterol 108 (90 Base) MCG/ACT inhaler Commonly known as: VENTOLIN HFA Inhale 2 puffs into the lungs every 4 (four) hours as needed for wheezing or shortness of breath.   amLODipine 5 MG tablet Commonly known as: NORVASC Take 5 mg by mouth 2 (two) times daily.   antiseptic oral rinse Liqd 15 mLs by Mouth Rinse route at bedtime.  Aspercreme w/Lidocaine 4 % cream Generic drug: lidocaine Apply 1 application topically 3 (three) times daily as needed (for pain).   atorvastatin 40 MG tablet Commonly known as: LIPITOR Take 40 mg by mouth daily.   budesonide-formoterol 160-4.5 MCG/ACT inhaler Commonly known as: SYMBICORT Inhale 2 puffs into the lungs 2 (two) times daily.   clopidogrel 75 MG tablet Commonly known as: PLAVIX Take 1 tablet (75 mg total) by mouth daily. Start taking on: April 24, 2020 What changed: These instructions start on April 24, 2020. If you are unsure what to do until then, ask your doctor or other care provider.   donepezil 10 MG tablet Commonly known as: ARICEPT Take 10 mg by mouth at bedtime.   ferrous sulfate 325 (65 FE) MG tablet Take 1 tablet (325 mg total) by mouth 2 (two) times daily with a meal. What changed: when to take this   fluticasone 50 MCG/ACT nasal spray Commonly known as: FLONASE Place 2 sprays into both nostrils daily.   Venida Jarvis 200-200-20 MG/5ML suspension Generic drug: alum & mag hydroxide-simeth Take 30 mLs by mouth 4 (four) times daily as needed for indigestion or heartburn.   hydrocortisone 25 MG suppository Commonly known as: ANUSOL-HC Place 1 suppository (25 mg total) rectally 2 (two) times daily. What changed:   when to take this  reasons to take this   Incruse Ellipta 62.5 MCG/INH Aepb Generic drug: umeclidinium bromide Inhale 1 puff into the lungs daily.   ipratropium-albuterol 0.5-2.5 (3) MG/3ML  Soln Commonly known as: DUONEB Take 3 mLs by nebulization every 6 (six) hours as needed. What changed: when to take this   lamoTRIgine 200 MG tablet Commonly known as: LAMICTAL Take 200 mg by mouth 2 (two) times daily.   LORazepam 1 MG tablet Commonly known as: ATIVAN Take 1 tablet (1 mg total) by mouth 2 (two) times daily.   LORazepam 0.5 MG tablet Commonly known as: ATIVAN Take 1 tablet (0.5 mg total) by mouth daily as needed for anxiety.   magnesium hydroxide 400 MG/5ML suspension Commonly known as: MILK OF MAGNESIA Take 30 mLs by mouth at bedtime as needed for mild constipation.   magnesium oxide 400 (241.3 Mg) MG tablet Commonly known as: MAG-OX Take 400 mg by mouth daily.   montelukast 10 MG tablet Commonly known as: SINGULAIR Take 10 mg by mouth at bedtime.   nystatin powder Generic drug: nystatin Apply 1 Bottle topically 2 (two) times daily as needed (itchy rash).   pantoprazole 40 MG tablet Commonly known as: Protonix Take 1 tablet (40 mg total) by mouth 2 (two) times daily for 60 days, THEN 1 tablet (40 mg total) daily. Start taking on: April 17, 2020 What changed:   medication strength  See the new instructions.   polyethylene glycol powder 17 GM/SCOOP powder Commonly known as: MiraLax Take 17 g by mouth 2 (two) times daily as needed for moderate constipation. Replaces: polyethylene glycol 17 g packet   potassium chloride 20 MEQ packet Commonly known as: KLOR-CON Take 20 mEq by mouth daily. What changed: Another medication with the same name was removed. Continue taking this medication, and follow the directions you see here.   QUEtiapine 100 MG tablet Commonly known as: SEROQUEL Take 100-150 mg by mouth See admin instructions. Taking 1 tablet (100mg ) in the morning and 1 & 1/2 tablet (150mg ) every evening   senna-docusate 8.6-50 MG tablet Commonly known as: Senokot-S Take 1 tablet by mouth 2 (two) times daily.   torsemide 20 MG  tablet Commonly known as: DEMADEX Take 3 tablets (60 mg total) by mouth daily.   Vitamin D 50 MCG (2000 UT) tablet Take 2,000 Units by mouth daily.         Relevant Imaging Results:  Relevant Lab Results:   Additional Information SSN [id number]  Fenna Semel, Meriam SpragueNORA H, RN

## 2020-04-19 NOTE — Anesthesia Postprocedure Evaluation (Signed)
Anesthesia Post Note  Patient: Cindy Padilla  Procedure(s) Performed: ESOPHAGOGASTRODUODENOSCOPY (EGD) WITH PROPOFOL (N/A )     Patient location during evaluation: Endoscopy Anesthesia Type: MAC Level of consciousness: awake Pain management: pain level controlled Vital Signs Assessment: post-procedure vital signs reviewed and stable Respiratory status: spontaneous breathing Cardiovascular status: stable Postop Assessment: no apparent nausea or vomiting Anesthetic complications: no   No complications documented.  Last Vitals:  Vitals:   04/17/20 0734 04/17/20 2140  BP:  (!) 145/77  Pulse:  62  Resp:  14  Temp:  37.1 C  SpO2: 95% 97%    Last Pain:  Vitals:   04/17/20 2140  TempSrc: Oral  PainSc:                  Huston Foley

## 2020-11-23 ENCOUNTER — Encounter (HOSPITAL_COMMUNITY): Payer: Self-pay | Admitting: Emergency Medicine

## 2020-11-23 ENCOUNTER — Emergency Department (HOSPITAL_COMMUNITY)
Admission: EM | Admit: 2020-11-23 | Discharge: 2020-11-24 | Disposition: A | Discharge status: Home / Self Care | Payer: Medicare Other | Attending: Emergency Medicine | Admitting: Emergency Medicine

## 2020-11-23 ENCOUNTER — Emergency Department (HOSPITAL_COMMUNITY): Payer: Medicare Other

## 2020-11-23 DIAGNOSIS — S0990XA Unspecified injury of head, initial encounter: Secondary | ICD-10-CM | POA: Insufficient documentation

## 2020-11-23 DIAGNOSIS — W1812XA Fall from or off toilet with subsequent striking against object, initial encounter: Secondary | ICD-10-CM | POA: Insufficient documentation

## 2020-11-23 DIAGNOSIS — Y92002 Bathroom of unspecified non-institutional (private) residence single-family (private) house as the place of occurrence of the external cause: Secondary | ICD-10-CM | POA: Insufficient documentation

## 2020-11-23 MED ORDER — ACETAMINOPHEN 500 MG PO TABS
1000.0000 mg | ORAL_TABLET | Freq: Once | ORAL | Status: AC
  Administered 2020-11-23: 1000 mg via ORAL
  Filled 2020-11-23: qty 2

## 2020-11-23 NOTE — ED Notes (Signed)
ED provider at bedside.

## 2020-11-23 NOTE — ED Notes (Signed)
This RN has attempted multiple times to call [name] to give report - number gets disconnected and/or the mailbox is full. Will continue to try and call

## 2020-11-23 NOTE — ED Triage Notes (Signed)
Fall on thinners. Fell in BR onto tile floor and hit back of head, hematoma noted. From [facility]. On Plavix, stated has been dizzy all day. Also c/o pain tailbone.

## 2020-11-23 NOTE — ED Notes (Addendum)
This RN spoke to [place] who also tried to contact [employer] with no luck. Will continue to try and call

## 2020-11-23 NOTE — ED Provider Notes (Signed)
[facility] EMERGENCY DEPARTMENT Provider Note   CSN: [id number] Arrival date & time: 11/23/20  1704     History Chief Complaint  Patient presents with   Cindy Padilla    Cindy Padilla is a 80 y.o. female.  80 yo F with a chief complaints of of a fall.  The patient states that she was in the bathroom when she was trying to make her toilet seat more steady and she slipped and she lost her balance on the railing and bumped her head against the wall and then against the ground.  She denies any other injury in the fall.  Denies headaches denies chest pain abdominal pain denies shortness of breath denies extremity pain.  She is on blood thinning medications.  Denies any confusion or vomiting.  The history is provided by the patient.  Fall This is a new problem. The current episode started 3 to 5 hours ago. The problem occurs constantly. The problem has not changed since onset.Associated symptoms include headaches. Pertinent negatives include no chest pain and no shortness of breath. Nothing aggravates the symptoms. Nothing relieves the symptoms. She has tried nothing for the symptoms. The treatment provided no relief.      History reviewed. No pertinent past medical history.  There are no problems to display for this patient.   History reviewed. No pertinent surgical history.   OB History   No obstetric history on file.     No family history on file.     Home Medications Prior to Admission medications   Not on File    Allergies    Penicillins  Review of Systems   Review of Systems  Constitutional:  Negative for chills and fever.  HENT:  Negative for congestion and rhinorrhea.   Eyes:  Negative for redness and visual disturbance.  Respiratory:  Negative for shortness of breath and wheezing.   Cardiovascular:  Negative for chest pain and palpitations.  Gastrointestinal:  Negative for nausea and vomiting.  Genitourinary:  Positive for frequency and  urgency. Negative for dysuria.  Musculoskeletal:  Negative for arthralgias and myalgias.  Skin:  Negative for pallor and wound.  Neurological:  Positive for headaches. Negative for dizziness.   Physical Exam Updated Vital Signs BP 138/64   Pulse 65   Temp 99.2 F (37.3 C) (Oral)   Resp 14   Ht 5' (1.524 m)   Wt 80.3 kg   SpO2 94%   BMI 34.57 kg/m   Physical Exam Vitals and nursing note reviewed.  Constitutional:      General: She is not in acute distress.    Appearance: She is well-developed. She is not diaphoretic.  HENT:     Head: Normocephalic and atraumatic.  Eyes:     Pupils: Pupils are equal, round, and reactive to light.  Cardiovascular:     Rate and Rhythm: Normal rate and regular rhythm.     Heart sounds: No murmur heard.   No friction rub. No gallop.  Pulmonary:     Effort: Pulmonary effort is normal.     Breath sounds: No wheezing or rales.  Abdominal:     General: There is no distension.     Palpations: Abdomen is soft.     Tenderness: There is no abdominal tenderness.  Musculoskeletal:        General: No tenderness.     Cervical back: Normal range of motion and neck supple.  Skin:    General: Skin is warm and dry.  Neurological:     Mental Status: She is alert and oriented to person, place, and time.  Psychiatric:        Behavior: Behavior normal.    ED Results / Procedures / Treatments   Labs (all labs ordered are listed, but only abnormal results are displayed) Labs Reviewed - No data to display  EKG EKG Interpretation  Date/Time:  Thursday November 23 2020 17:20:10 EDT Ventricular Rate:  65 PR Interval:  183 QRS Duration: 98 QT Interval:  445 QTC Calculation: 463 R Axis:   20 Text Interpretation: Sinus rhythm Atrial premature complex Low voltage, precordial leads Nonspecific T abnormalities, lateral leads No old tracing to compare Confirmed by Melene Plan [phone]) on 11/23/2020 5:39:16 PM  Radiology CT Head Wo Contrast  Result Date:  11/23/2020 CLINICAL DATA:  Status post fall. EXAM: CT HEAD WITHOUT CONTRAST TECHNIQUE: Contiguous axial images were obtained from the base of the skull through the vertex without intravenous contrast. COMPARISON:  February 03, 2019 FINDINGS: Brain: There is mild cerebral atrophy with widening of the extra-axial spaces and ventricular dilatation. There are areas of decreased attenuation within the white matter tracts of the supratentorial brain, consistent with microvascular disease changes. 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 Vascular: No hyperdense vessel or unexpected calcification. Skull: Normal. Negative for fracture or focal lesion. Sinuses/Orbits: No acute finding. Other: None. IMPRESSION: 1. Generalized cerebral atrophy. 2. No acute intracranial abnormality. Electronically Signed   By: Aram Candela M.D.   On: 11/23/2020 18:25    Procedures Procedures   Medications Ordered in ED Medications  acetaminophen (TYLENOL) tablet 1,000 mg (1,000 mg Oral Given 11/23/20 1805)    ED Course  I have reviewed the triage vital signs and the nursing notes.  Pertinent labs & imaging results that were available during my care of the patient were reviewed by me and considered in my medical decision making (see chart for details).    MDM Rules/Calculators/A&P                          80 yo F with a cc of a fall.  Non syncopal by hx.  CT head.   CT head negative. D/c home.   6:50 PM:  I have discussed the diagnosis/risks/treatment options with the patient and believe the pt to be eligible for discharge home to follow-up with PCP. We also discussed returning to the ED immediately if new or worsening sx occur. We discussed the sx which are most concerning (e.g., sudden worsening pain, fever, inability to tolerate by mouth) that necessitate immediate return. Medications administered to the patient during their visit and any new prescriptions provided to the  patient are listed below.  Medications given during this visit Medications  acetaminophen (TYLENOL) tablet 1,000 mg (1,000 mg Oral Given 11/23/20 1805)     The patient appears reasonably screen and/or stabilized for discharge and I doubt any other medical condition or other [facility] requiring further screening, evaluation, or treatment in the ED at this time prior to discharge.     Final Clinical Impression(s) / ED Diagnoses Final diagnoses:  Closed head injury, initial encounter    Rx / DC Orders ED Discharge Orders     None        Melene Plan, DO 11/23/20 1850

## 2020-11-23 NOTE — Progress Notes (Signed)
Orthopedic Tech Progress Note Patient Details:  Cindy Padilla 25-Jun-1940 [id number] Level 2 Trauma  Patient ID: [name], female   DOB: [birth year], 80 y.o.   MRN: [id number]  Smitty Pluck 11/23/2020, 5:18 PM

## 2020-11-23 NOTE — ED Notes (Signed)
PTAR called, 10th down

## 2020-11-24 ENCOUNTER — Encounter (HOSPITAL_COMMUNITY): Payer: Self-pay | Admitting: Gastroenterology

## 2021-03-18 ENCOUNTER — Emergency Department (HOSPITAL_COMMUNITY): Payer: Medicare Other

## 2021-03-18 ENCOUNTER — Emergency Department (HOSPITAL_COMMUNITY)
Admission: EM | Admit: 2021-03-18 | Discharge: 2021-03-19 | Disposition: A | Discharge status: Home / Self Care | Payer: Medicare Other | Attending: Emergency Medicine | Admitting: Emergency Medicine

## 2021-03-18 DIAGNOSIS — Z87891 Personal history of nicotine dependence: Secondary | ICD-10-CM | POA: Diagnosis not present

## 2021-03-18 DIAGNOSIS — I10 Essential (primary) hypertension: Secondary | ICD-10-CM | POA: Diagnosis not present

## 2021-03-18 DIAGNOSIS — J449 Chronic obstructive pulmonary disease, unspecified: Secondary | ICD-10-CM | POA: Insufficient documentation

## 2021-03-18 DIAGNOSIS — E119 Type 2 diabetes mellitus without complications: Secondary | ICD-10-CM | POA: Diagnosis not present

## 2021-03-18 DIAGNOSIS — R109 Unspecified abdominal pain: Secondary | ICD-10-CM | POA: Insufficient documentation

## 2021-03-18 DIAGNOSIS — R197 Diarrhea, unspecified: Secondary | ICD-10-CM | POA: Diagnosis present

## 2021-03-18 DIAGNOSIS — Z20822 Contact with and (suspected) exposure to covid-19: Secondary | ICD-10-CM | POA: Insufficient documentation

## 2021-03-18 LAB — CBC WITH DIFFERENTIAL/PLATELET
Abs Immature Granulocytes: 0.02 10*3/uL (ref 0.00–0.07)
Basophils Absolute: 0 10*3/uL (ref 0.0–0.1)
Basophils Relative: 1 %
Eosinophils Absolute: 0.1 10*3/uL (ref 0.0–0.5)
Eosinophils Relative: 2 %
HCT: 37.3 % (ref 36.0–46.0)
Hemoglobin: 12.4 g/dL (ref 12.0–15.0)
Immature Granulocytes: 0 %
Lymphocytes Relative: 33 %
Lymphs Abs: 2.8 10*3/uL (ref 0.7–4.0)
MCH: 30.5 pg (ref 26.0–34.0)
MCHC: 33.2 g/dL (ref 30.0–36.0)
MCV: 91.6 fL (ref 80.0–100.0)
Monocytes Absolute: 0.8 10*3/uL (ref 0.1–1.0)
Monocytes Relative: 10 %
Neutro Abs: 4.6 10*3/uL (ref 1.7–7.7)
Neutrophils Relative %: 54 %
Platelets: 258 10*3/uL (ref 150–400)
RBC: 4.07 MIL/uL (ref 3.87–5.11)
RDW: 13.7 % (ref 11.5–15.5)
WBC: 8.3 10*3/uL (ref 4.0–10.5)
nRBC: 0 % (ref 0.0–0.2)

## 2021-03-18 LAB — URINALYSIS, ROUTINE W REFLEX MICROSCOPIC
Bacteria, UA: NONE SEEN
Bilirubin Urine: NEGATIVE
Glucose, UA: NEGATIVE mg/dL
Ketones, ur: NEGATIVE mg/dL
Leukocytes,Ua: NEGATIVE
Nitrite: NEGATIVE
Protein, ur: NEGATIVE mg/dL
Specific Gravity, Urine: 1.004 — ABNORMAL LOW (ref 1.005–1.030)
pH: 7 (ref 5.0–8.0)

## 2021-03-18 LAB — COMPREHENSIVE METABOLIC PANEL
ALT: 19 U/L (ref 0–44)
AST: 24 U/L (ref 15–41)
Albumin: 3.9 g/dL (ref 3.5–5.0)
Alkaline Phosphatase: 107 U/L (ref 38–126)
Anion gap: 13 (ref 5–15)
BUN: 16 mg/dL (ref 8–23)
CO2: 26 mmol/L (ref 22–32)
Calcium: 9.2 mg/dL (ref 8.9–10.3)
Chloride: 93 mmol/L — ABNORMAL LOW (ref 98–111)
Creatinine, Ser: 1 mg/dL (ref 0.44–1.00)
GFR, Estimated: 57 mL/min — ABNORMAL LOW (ref >60)
Glucose, Bld: 130 mg/dL — ABNORMAL HIGH (ref 70–99)
Potassium: 3.8 mmol/L (ref 3.5–5.1)
Sodium: 132 mmol/L — ABNORMAL LOW (ref 135–145)
Total Bilirubin: 0.6 mg/dL (ref 0.3–1.2)
Total Protein: 7.7 g/dL (ref 6.5–8.1)

## 2021-03-18 LAB — TROPONIN I (HIGH SENSITIVITY): Troponin I (High Sensitivity): 17 ng/L (ref <18)

## 2021-03-18 LAB — LIPASE, BLOOD: Lipase: 62 U/L — ABNORMAL HIGH (ref 11–51)

## 2021-03-18 NOTE — ED Provider Notes (Signed)
Emergency Medicine Provider Triage Evaluation Note  Cindy Padilla , a 80 y.o. female  was evaluated in triage.  Pt complains of epigastric abdominal pain, nausea, vomiting, diarrhea, generalized weakness.  Patient has history of dementia.    Per EMS patient was having nausea, vomiting, diarrhea, and generalized weakness since eating lunch earlier today.  Review of Systems  Positive:  Negative:   Unable to perform  Physical Exam  BP (!) 144/66 (BP Location: Right Arm)   Pulse 84   Temp 98.2 F (36.8 C) (Oral)   Resp 18   SpO2 96%  Gen:   Awake, no distress   Resp:  Normal effort  MSK:   Moves extremities without difficulty  Other:  Abdomen soft, nondistended, tenderness to epigastric area, no guarding or rebound tenderness.  Medical Decision Making  Medically screening exam initiated at 7:44 PM.  Appropriate orders placed.  Cindy Padilla was informed that the remainder of the evaluation will be completed by another provider, this initial triage assessment does not replace that evaluation, and the importance of remaining in the ED until their evaluation is complete.  Abdominal labs ordered.  Complains of epigastric pain and additional concern for possible ACS we will add on chest x-ray and troponin.   Cindy Schroeder, PA-C 03/18/21 1947    Cindy Lefevre, MD 03/18/21 2056

## 2021-03-18 NOTE — ED Triage Notes (Signed)
Pt from [facility] - dementia unit.  C/O Generalized weakness, diarrhea and nausea since lunch time. Was able to ambulate w/walker and not orthostatic.   170/80 90 pulse 18 RR  92%RA

## 2021-03-19 ENCOUNTER — Emergency Department (HOSPITAL_COMMUNITY): Payer: Medicare Other

## 2021-03-19 ENCOUNTER — Other Ambulatory Visit: Payer: Self-pay

## 2021-03-19 DIAGNOSIS — R197 Diarrhea, unspecified: Secondary | ICD-10-CM | POA: Diagnosis not present

## 2021-03-19 LAB — RESP PANEL BY RT-PCR (FLU A&B, COVID) ARPGX2
Influenza A by PCR: NEGATIVE
Influenza B by PCR: NEGATIVE
SARS Coronavirus 2 by RT PCR: NEGATIVE

## 2021-03-19 MED ORDER — SODIUM CHLORIDE 0.9 % IV BOLUS
1000.0000 mL | Freq: Once | INTRAVENOUS | Status: AC
  Administered 2021-03-19: 1000 mL via INTRAVENOUS

## 2021-03-19 NOTE — ED Notes (Signed)
Rounded on pt and pt stated that the purewick she had was removed and her brief was now wet. Explained to pt we do not have suction canisters for the purewicks in the hallway and that was why it was removed. I then offered to take pt to the restroom and change her brief, and she refused. Pt stated "No. I'm just gonna lay here and piss on myself." Explained to pt we would prefer she not and that we can take her to the bathroom whenever she needs to urinate and that I can change her brief now. Pt continued to refuse and say she would just continue to pee in her current brief on the bed. Brought pt a ginger ale, per her request, and offered again to change her. Pt again refused. Asked pt to notify staff when she needs to urinate.

## 2021-03-19 NOTE — Discharge Instructions (Addendum)
At this time there does not appear to be the presence of an emergent medical condition, however there is always the potential for conditions to change. Please read and follow the below instructions.  Please return to the Emergency Department immediately for any new or worsening symptoms or if your symptoms do not improve within 3 days. Please be sure to follow up with your Primary Care Provider within one week regarding your visit today; please call their office to schedule an appointment even if you are feeling better for a follow-up visit. 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  Please discuss these findings with your primary care provider at your follow-up appointment this week.  You may also call the general surgery at [facility] surgery to discuss the above findings.  Go to the nearest Emergency Department immediately if: You have fever or chills You have chest pain. You feel very weak or you pass out (faint). You have bloody or black poop or poop that looks like tar. You have very bad pain, cramping, or bloating in your belly (abdomen). You have trouble breathing or you are breathing very quickly. Your heart is beating very quickly. Your skin feels cold and clammy. You feel confused. You have signs of losing too much water in your body, such as: Dark pee, very little pee, or no pee. Cracked lips. Dry mouth. Sunken eyes. Sleepiness. Weakness. You have any new/concerning or worsening of symptoms   Please read the additional information packets attached to your discharge summary.  Do not take your medicine if  develop an itchy rash, swelling in your mouth or lips, or difficulty breathing; call 911 and seek immediate emergency medical attention if this occurs.  You may review your lab tests and  imaging results in their entirety on your MyChart account.  Please discuss all results of fully with your primary care provider and other specialist at your follow-up visit.  Note: Portions of this text may have been transcribed using voice recognition software. Every effort was made to ensure accuracy; however, inadvertent computerized transcription errors may still be present.

## 2021-03-19 NOTE — ED Provider Notes (Signed)
[facility] EMERGENCY DEPARTMENT Provider Note   CSN: [id number] Arrival date & time: 03/18/21  1920     History Chief Complaint  Patient presents with   Weakness   Diarrhea    Cindy Padilla is a 80 y.o. female history of bipolar, dementia, COPD, IBS, diabetes, hypertension, GERD, GI bleed, CHF.  Patient alert and oriented x4, patient reports that she had some sauerkraut yesterday afterwards she had some stomach pain on the right side it was crampy/achy, mild and associated with small amount of nonbloody diarrhea.  Patient reports this is consistent with her normal IBS but facility staff sent her to the emergency department for evaluation.  Patient feels her symptoms have gradually improved since arriving to the ER on 14 hours ago.  Patient denies fever, chills, chest pain, shortness of breath, cough, dysuria/hematuria, nausea/vomiting, bright red blood per rectum, melena or any additional concerns  HPI     Past Medical History:  Diagnosis Date   Arthritis    Bipolar 1 disorder (HCC)    Bronchitis    Chronic back pain    Constipation    COPD (chronic obstructive pulmonary disease) (HCC)    Dementia (HCC)    Diabetes mellitus    GERD (gastroesophageal reflux disease)    Hiatal hernia    Hypertension    IBS (irritable bowel syndrome)     Patient Active Problem List   Diagnosis Date Noted   Melena    Heme positive stool    Acute GI bleeding 04/13/2020   Near syncope 10/15/2019   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   BRBPR (bright red blood per rectum) 04/28/2019   Acute exacerbation of chronic obstructive pulmonary disease (COPD) (HCC) 02/26/2017   COPD with acute exacerbation (HCC) 02/24/2017   Bipolar 1 disorder (HCC) 02/24/2017   Normocytic anemia 02/24/2017   Morbid obesity (HCC) 04/14/2015   Upper airway cough syndrome 04/14/2015   Ventral hernia 04/24/2014   Constipation 04/24/2014   Hemorrhoids 04/22/2014   Abnormality  of gait 09/30/2013   Dyskinesia, 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   Chronic diastolic CHF (congestive heart failure) (HCC) 07/27/2013   Dementia with behavioral disturbance 07/27/2013   Weakness 02/09/2013   Hyponatremia 02/09/2013   Shortness of breath 02/09/2013   HTN (hypertension) 02/09/2013   Other and unspecified hyperlipidemia 02/09/2013    Past Surgical History:  Procedure Laterality Date   ESOPHAGOGASTRODUODENOSCOPY (EGD) WITH PROPOFOL N/A 04/14/2020   Procedure: ESOPHAGOGASTRODUODENOSCOPY (EGD) WITH PROPOFOL;  Surgeon: Napoleon Form, MD;  Location: [facility];  Service: Endoscopy;  Laterality: N/A;   gallbladder     MANDIBLE SURGERY     PARTIAL HYSTERECTOMY       OB History   No obstetric history on file.     Family History  Problem Relation Age of Onset   Emphysema Mother    Cancer Other     Social History   Tobacco Use   Smokeless tobacco: Former    Types: Snuff    Quit date: 05/28/1959   Tobacco comments:    Pt used snuff for 33 years  Vaping Use   Vaping Use: Never used  Substance Use Topics   Alcohol use: No   Drug use: No    Home Medications Prior to Admission medications   Medication Sig Start Date End Date Taking? Authorizing Provider  acetaminophen (TYLENOL) 325 MG tablet Take 650 mg by mouth 3 (three) times daily.    [provider]  albuterol (  PROVENTIL HFA;VENTOLIN HFA) 108 (90 BASE) MCG/ACT inhaler Inhale 2 puffs into the lungs every 4 (four) hours as needed for wheezing or shortness of breath.    [provider]  alum & mag hydroxide-simeth (GERI-LANTA) 200-200-20 MG/5ML suspension Take 30 mLs by mouth 4 (four) times daily as needed for indigestion or heartburn.     [provider]  amLODipine (NORVASC) 5 MG tablet Take 5 mg by mouth 2 (two) times daily.     [provider]  antiseptic oral rinse (BIOTENE) LIQD 15 mLs by Mouth Rinse route at bedtime.    [provider]   atorvastatin (LIPITOR) 40 MG tablet Take 40 mg by mouth daily.     [provider]  budesonide-formoterol (SYMBICORT) 160-4.5 MCG/ACT inhaler Inhale 2 puffs into the lungs 2 (two) times daily.     [provider]  Cholecalciferol (VITAMIN D) 50 MCG (2000 UT) tablet Take 2,000 Units by mouth daily.     [provider]  clopidogrel (PLAVIX) 75 MG tablet Take 1 tablet (75 mg total) by mouth daily. 04/20/20   Almon Hercules, MD  donepezil (ARICEPT) 10 MG tablet Take 10 mg by mouth at bedtime.    [provider]  ferrous sulfate 325 (65 FE) MG tablet Take 1 tablet (325 mg total) by mouth 2 (two) times daily with a meal. 04/17/20   Gonfa, Boyce Medici, MD  fluticasone (FLONASE) 50 MCG/ACT nasal spray Place 2 sprays into both nostrils daily.     [provider]  hydrocortisone (ANUSOL-HC) 25 MG suppository Place 1 suppository (25 mg total) rectally 2 (two) times daily. Patient taking differently: Place 25 mg rectally 2 (two) times daily as needed for hemorrhoids.  04/30/19   Kathlen Mody, MD  INCRUSE ELLIPTA 62.5 MCG/INH AEPB Inhale 1 puff into the lungs daily.  04/17/19   [provider]  ipratropium-albuterol (DUONEB) 0.5-2.5 (3) MG/3ML SOLN Take 3 mLs by nebulization every 6 (six) hours as needed. Patient taking differently: Take 3 mLs by nebulization 4 (four) times daily.  04/21/18   Barnetta Chapel, MD  lamoTRIgine (LAMICTAL) 200 MG tablet Take 200 mg by mouth 2 (two) times daily.    [provider]  lidocaine (ASPERCREME W/LIDOCAINE) 4 % cream Apply 1 application topically 3 (three) times daily as needed (for pain).     [provider]  LORazepam (ATIVAN) 0.5 MG tablet Take 1 tablet (0.5 mg total) by mouth daily as needed for anxiety. 10/16/19   Elgergawy, Leana Roe, MD  LORazepam (ATIVAN) 1 MG tablet Take 1 tablet (1 mg total) by mouth 2 (two) times daily. 10/16/19   Elgergawy, Leana Roe, MD  magnesium hydroxide (MILK OF MAGNESIA) 400  MG/5ML suspension Take 30 mLs by mouth at bedtime as needed for mild constipation.    [provider]  magnesium oxide (MAG-OX) 400 (241.3 Mg) MG tablet Take 400 mg by mouth daily.    [provider]  montelukast (SINGULAIR) 10 MG tablet Take 10 mg by mouth at bedtime.    [provider]  nystatin (NYSTATIN) powder Apply 1 Bottle topically 2 (two) times daily as needed (itchy rash).    [provider]  pantoprazole (PROTONIX) 40 MG tablet Take 1 tablet (40 mg total) by mouth 2 (two) times daily for 60 days, THEN 1 tablet (40 mg total) daily. 04/17/20 10/14/20  Almon Hercules, MD  polyethylene glycol powder (MIRALAX) 17 GM/SCOOP powder Take 17 g by mouth 2 (two) times daily as  needed for moderate constipation. 04/17/20   Almon Hercules, MD  potassium chloride (KLOR-CON) 20 MEQ packet Take 20 mEq by mouth daily.    [provider]  QUEtiapine (SEROQUEL) 100 MG tablet Take 100-150 mg by mouth See admin instructions. Taking 1 tablet (100mg ) in the morning and 1 & 1/2 tablet (150mg ) every evening    [provider]  senna-docusate (SENOKOT-S) 8.6-50 MG per tablet Take 1 tablet by mouth 2 (two) times daily.    [provider]  torsemide (DEMADEX) 20 MG tablet Take 3 tablets (60 mg total) by mouth daily. 10/16/19   Elgergawy, 09-02-1979, MD    Allergies    Aminoglycosides, Aspirin, Codeine, Ivp dye [iodinated diagnostic agents], Morphine and related, Neomycin, Penicillins, Penicillins, Phenothiazines, Promethazine, Sulfa antibiotics, and Tetracyclines & related  Review of Systems   Review of Systems Ten systems are reviewed and are negative for acute change except as noted in the HPI  Physical Exam Updated Vital Signs BP (!) 153/79   Pulse 86   Temp 98.1 F (36.7 C) (Oral)   Resp 16   SpO2 96%   Physical Exam Constitutional:      General: She is not in acute distress.    Appearance: Normal appearance. She is well-developed. She is not  ill-appearing or diaphoretic.  HENT:     Head: Normocephalic and atraumatic.  Eyes:     General: Vision grossly intact. Gaze aligned appropriately.     Pupils: Pupils are equal, round, and reactive to light.  Neck:     Trachea: Trachea and phonation normal.  Pulmonary:     Effort: Pulmonary effort is normal. No respiratory distress.  Abdominal:     General: There is no distension.     Palpations: Abdomen is soft.     Tenderness: There is abdominal tenderness (Mild right lower abdominal tenderness). There is no guarding or rebound.  Musculoskeletal:        General: Normal range of motion.     Cervical back: Normal range of motion.  Skin:    General: Skin is warm and dry.  Neurological:     Mental Status: She is alert.     GCS: GCS eye subscore is 4. GCS verbal subscore is 5. GCS motor subscore is 6.     Comments: Speech is clear and goal oriented, follows commands Major Cranial nerves without deficit, no facial droop Moves extremities without ataxia, coordination intact  Psychiatric:        Behavior: Behavior normal.    ED Results / Procedures / Treatments   Labs (all labs ordered are listed, but only abnormal results are displayed) Labs Reviewed  COMPREHENSIVE METABOLIC PANEL - Abnormal; Notable for the following components:      Result Value   Sodium 132 (*)    Chloride 93 (*)    Glucose, Bld 130 (*)    GFR, Estimated 57 (*)    All other components within normal limits  LIPASE, BLOOD - Abnormal; Notable for the following components:   Lipase 62 (*)    All other components within normal limits  URINALYSIS, ROUTINE W REFLEX MICROSCOPIC - Abnormal; Notable for the following components:   Color, Urine STRAW (*)    Specific Gravity, Urine 1.004 (*)    Hgb urine dipstick SMALL (*)    All other components within normal limits  RESP PANEL BY RT-PCR (FLU A&B, COVID) ARPGX2  URINE CULTURE  CBC WITH DIFFERENTIAL/PLATELET  TROPONIN I (HIGH SENSITIVITY)    EKG  EKG  Interpretation  Date/Time:  Sunday March 18 2021 19:47:43 EDT Ventricular Rate:  78 PR Interval:  180 QRS Duration: 76 QT Interval:  414 QTC Calculation: 471 R Axis:   19 Text Interpretation: Normal sinus rhythm Low voltage QRS Cannot rule out Anterior infarct , age undetermined Abnormal ECG Confirmed by Lorre Nick ([id number]) on 03/19/2021 8:55:29 AM  Radiology CT ABDOMEN PELVIS WO CONTRAST  Result Date: 03/19/2021 CLINICAL DATA:  Acute nonlocalized abdominal pain, weakness, diarrhea EXAM: CT ABDOMEN AND PELVIS WITHOUT CONTRAST TECHNIQUE: Multidetector CT imaging of the abdomen and pelvis was performed following the standard protocol without IV contrast. Sagittal and coronal MPR images reconstructed from axial data set. No oral contrast administered. Examination degraded by motion artifacts. COMPARISON:  04/13/2020 FINDINGS: Lower chest: Lung bases clear Hepatobiliary: Post cholecystectomy. Large cyst LEFT lobe liver 6.5 x 5.2 x 5.1 cm image 14. Additional low-attenuation foci within liver consistent with additional cysts, better visualized on prior exam. No additional masses. Pancreas: Grossly unremarkable Spleen: Normal appearance Adrenals/Urinary Tract: Adrenal glands normal appearance. Small exophytic high attenuation nodule RIGHT kidney 12 x 10 mm question hyperdense cyst, unchanged. Calcification at LEFT renal hilum unchanged, likely vascular. Minimal dilatation of RIGHT renal collecting system unchanged. Ureters and bladder unremarkable. No definite urinary tract calcification. Stomach/Bowel: Minimal sigmoid diverticulosis without evidence of diverticulitis. Portion of the gastric antrum extends into a RIGHT upper quadrant ventral hernia unchanged from previous exam, without gastric dilatation to suggest outlet obstruction. Unable to exclude wall thickening of the stomach due to underdistention. Remaining bowel loops grossly unremarkable. Appendix not visualized. Vascular/Lymphatic:  Atherosclerotic calcifications aorta and iliac arteries. Additional dense atherosclerotic calcifications at the proximal renal arteries bilaterally at the origins of the celiac and superior mesenteric arteries. Large calcification at splenic hilum question calcified splenic artery aneurysm 14 x 9 mm. No adenopathy. Numerous phleboliths. Reproductive: Uterus surgically absent.  Atrophic ovaries. Other: No free air or free fluid. Within limitations from motion artifacts, no definite inflammatory process seen. Musculoskeletal: Bones demineralized with degenerative disc and facet disease changes of lumbar spine, including chronic grade 1 anterolisthesis L4-L5 IMPRESSION: Hepatic cysts, largest 6.5 cm diameter. Minimal sigmoid diverticulosis without evidence of diverticulitis. RIGHT upper quadrant ventral hernia containing a portion of the gastric antrum without evidence of gastric outlet obstruction, unchanged. 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 No definite acute intra-abdominal or intrapelvic abnormalities. Aortic Atherosclerosis (ICD10-I70.0). Electronically Signed   By: Ulyses Southward M.D.   On: 03/19/2021 08:55   DG Chest 2 View  Result Date: 03/18/2021 CLINICAL DATA:  Fatigue EXAM: CHEST - 2 VIEW COMPARISON:  10/15/2019 FINDINGS: Heart and mediastinal contours are within normal limits. No focal opacities or effusions. No acute bony abnormality. Aortic atherosclerosis. IMPRESSION: No active cardiopulmonary disease. Electronically Signed   By: Charlett Nose M.D.   On: 03/18/2021 20:21    Procedures Procedures   Medications Ordered in ED Medications  sodium chloride 0.9 % bolus 1,000 mL (0 mLs Intravenous Stopped 03/19/21 1009)    ED Course  I have reviewed the triage vital signs and the nursing notes.  Pertinent labs & imaging results that were available during my care of the patient were reviewed by me and considered in my medical decision making (see chart for details).    MDM  Rules/Calculators/A&P                           Additional history obtained from: Nursing notes from this  visit. Review of EMR. [facility] Director [employer]. ----------------------------------- I ordered, reviewed and interpreted labs which include: COVID/influenza panel negative. CBC within normal limits, no leukocytosis to suggest infectious process.  No anemia or thrombocytopenia. High-sensitivity troponin within normal limits, given patient has no complaint of chest pain or shortness of breath and EKG without acute ischemic changes no delta troponin is indicated. This was discussed with Dr. Freida Busman who agrees.  Lipase mildly elevated at 62. CMP shows mild hyponatremia at 132 and mild hypochloremia at 93.  This will be repleted with IV fluids.  No AKI, emergent lecture derangement, LFT elevations or gap. Urinalysis not suggestive of infectious process.  CXR:  IMPRESSION:  No active cardiopulmonary disease.   CTAP: IMPRESSION:  Hepatic cysts, largest 6.5 cm diameter.     Minimal sigmoid diverticulosis without evidence of diverticulitis.     RIGHT upper quadrant ventral hernia containing a portion of the  gastric antrum without evidence of gastric outlet obstruction,  unchanged.     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     No definite acute intra-abdominal or intrapelvic abnormalities.     Aortic Atherosclerosis (ICD10-I70.0).   EKG: Normal sinus rhythm Low voltage QRS Cannot rule out Anterior infarct , age undetermined Abnormal ECG Confirmed by Lorre Nick ([id number]) on 03/19/2021 8:55:29 AM ----------------- Patient reassessed resting calmly bed no acute distress tolerating p.o. she has no complaints or concerns vital signs are stable.  I spoke with the director of [facility] nursing facility Deedee Barns who reports that patient's above story is correct, she had abd pain and diarrhea after eating dinner, they had no other concerns regarding  this patient.  They are agreeable to take patient back at their facility.  Suspect patient experiencing diarrhea due to diet yesterday possible IBS.  No evidence for infectious process to necessitate antibiotics or other emergent pathologies at this time.  Patient was made aware of incidental findings above which she reports that she was already aware of, these were also stated on her discharge paperwork going back to her nursing home for her to have primary care and general surgery follow-up.   At this time there does not appear to be any evidence of an acute emergency medical condition and the patient appears stable for discharge with appropriate outpatient follow up. Diagnosis was discussed with patient who verbalizes understanding of care plan and is agreeable to discharge. I have discussed return precautions with patient  who verbalizes understanding. Patient encouraged to follow-up with their PCP. All questions answered.  Patient seen and evaluated by Dr. Freida Busman during this visit who agrees with workup and discharge at this time.  Note: Portions of this report may have been transcribed using voice recognition software. Every effort was made to ensure accuracy; however, inadvertent computerized transcription errors may still be present.  Final Clinical Impression(s) / ED Diagnoses Final diagnoses:  Diarrhea, unspecified type    Rx / DC Orders ED Discharge Orders     None        Elizabeth Palau 03/19/21 1023    Lorre Nick, MD 03/24/21 [phone]

## 2021-03-19 NOTE — ED Provider Notes (Signed)
I provided a substantive portion of the care of this patient.  I personally performed the entirety of the medical decision making for this encounter.  EKG Interpretation  Date/Time:  Sunday March 18 2021 19:47:43 EDT Ventricular Rate:  78 PR Interval:  180 QRS Duration: 76 QT Interval:  414 QTC Calculation: 471 R Axis:   19 Text Interpretation: Normal sinus rhythm Low voltage QRS Cannot rule out Anterior infarct , age undetermined Abnormal ECG Confirmed by Lorre Nick ([id number]) on 03/19/2021 8:7:90 AM   80 year old female presents nausea vomiting and diarrhea with some weakness.  Has history of IBS and thinks that this is it.  Abdominal CT without acute findings.  EKG without ischemic changes.  Patient eating and drinking properly will be discharged   Lorre Nick, MD 03/19/21 [phone]

## 2021-03-19 NOTE — ED Notes (Signed)
[facility] called for report. Report given to staff, no concerns or questions. [facility] called.

## 2021-03-19 NOTE — ED Notes (Signed)
Pt not present in waiting room when called to reassess vital signs.

## 2021-03-19 NOTE — ED Notes (Signed)
Pt not answering 2nd call for vital signs.

## 2021-03-20 LAB — URINE CULTURE

## 2022-02-22 ENCOUNTER — Emergency Department (HOSPITAL_COMMUNITY): Payer: Medicare Other

## 2022-02-22 ENCOUNTER — Encounter (HOSPITAL_COMMUNITY): Payer: Self-pay

## 2022-02-22 ENCOUNTER — Emergency Department (HOSPITAL_COMMUNITY)
Admission: EM | Admit: 2022-02-22 | Discharge: 2022-02-22 | Disposition: A | Discharge status: Skilled Nursing Facility | Payer: Medicare Other | Attending: Emergency Medicine | Admitting: Emergency Medicine

## 2022-02-22 DIAGNOSIS — Z7901 Long term (current) use of anticoagulants: Secondary | ICD-10-CM | POA: Insufficient documentation

## 2022-02-22 DIAGNOSIS — W01198A Fall on same level from slipping, tripping and stumbling with subsequent striking against other object, initial encounter: Secondary | ICD-10-CM | POA: Diagnosis not present

## 2022-02-22 DIAGNOSIS — W19XXXA Unspecified fall, initial encounter: Secondary | ICD-10-CM

## 2022-02-22 DIAGNOSIS — S3991XA Unspecified injury of abdomen, initial encounter: Secondary | ICD-10-CM | POA: Diagnosis not present

## 2022-02-22 DIAGNOSIS — S0990XA Unspecified injury of head, initial encounter: Secondary | ICD-10-CM | POA: Insufficient documentation

## 2022-02-22 DIAGNOSIS — R0789 Other chest pain: Secondary | ICD-10-CM | POA: Insufficient documentation

## 2022-02-22 DIAGNOSIS — F0390 Unspecified dementia without behavioral disturbance: Secondary | ICD-10-CM | POA: Insufficient documentation

## 2022-02-22 LAB — COMPREHENSIVE METABOLIC PANEL
ALT: 20 U/L (ref 0–44)
AST: 29 U/L (ref 15–41)
Albumin: 2.7 g/dL — ABNORMAL LOW (ref 3.5–5.0)
Alkaline Phosphatase: 142 U/L — ABNORMAL HIGH (ref 38–126)
Anion gap: 10 (ref 5–15)
BUN: 9 mg/dL (ref 8–23)
CO2: 29 mmol/L (ref 22–32)
Calcium: 9.1 mg/dL (ref 8.9–10.3)
Chloride: 100 mmol/L (ref 98–111)
Creatinine, Ser: 0.83 mg/dL (ref 0.44–1.00)
GFR, Estimated: >60 mL/min (ref >60)
Glucose, Bld: 104 mg/dL — ABNORMAL HIGH (ref 70–99)
Potassium: 3.4 mmol/L — ABNORMAL LOW (ref 3.5–5.1)
Sodium: 139 mmol/L (ref 135–145)
Total Bilirubin: 0.5 mg/dL (ref 0.3–1.2)
Total Protein: 7.3 g/dL (ref 6.5–8.1)

## 2022-02-22 LAB — CBC WITH DIFFERENTIAL/PLATELET
Abs Immature Granulocytes: 0.07 10*3/uL (ref 0.00–0.07)
Basophils Absolute: 0.1 10*3/uL (ref 0.0–0.1)
Basophils Relative: 1 %
Eosinophils Absolute: 0.3 10*3/uL (ref 0.0–0.5)
Eosinophils Relative: 4 %
HCT: 35.2 % — ABNORMAL LOW (ref 36.0–46.0)
Hemoglobin: 11.5 g/dL — ABNORMAL LOW (ref 12.0–15.0)
Immature Granulocytes: 1 %
Lymphocytes Relative: 22 %
Lymphs Abs: 1.6 10*3/uL (ref 0.7–4.0)
MCH: 30.4 pg (ref 26.0–34.0)
MCHC: 32.7 g/dL (ref 30.0–36.0)
MCV: 93.1 fL (ref 80.0–100.0)
Monocytes Absolute: 0.7 10*3/uL (ref 0.1–1.0)
Monocytes Relative: 9 %
Neutro Abs: 4.8 10*3/uL (ref 1.7–7.7)
Neutrophils Relative %: 63 %
Platelets: 361 10*3/uL (ref 150–400)
RBC: 3.78 MIL/uL — ABNORMAL LOW (ref 3.87–5.11)
RDW: 13.9 % (ref 11.5–15.5)
WBC: 7.6 10*3/uL (ref 4.0–10.5)
nRBC: 0 % (ref 0.0–0.2)

## 2022-02-22 MED ORDER — ACETAMINOPHEN 500 MG PO TABS
500.0000 mg | ORAL_TABLET | Freq: Once | ORAL | Status: AC
  Administered 2022-02-22: 500 mg via ORAL
  Filled 2022-02-22: qty 1

## 2022-02-22 NOTE — ED Notes (Signed)
PT provided [place] sandwich and gingerale to drink. Pt reports head feeling better. Pt denies any other needs.

## 2022-02-22 NOTE — ED Notes (Signed)
Attempted report to [facility] x 4 with no answer from RN. Will call back for report to facility. Cleared for discharge.

## 2022-02-22 NOTE — ED Notes (Signed)
Pt lives in [place] and uses a walker when ambulating. Hx of dementia and currently at baseline mentation. GCS of 14 upon arrival which is pt baseline. Pt reports walking in her room when she slipped due to grape jelly on the floor. Pt landed on the floor where she hit her head. Currently on Plavix. Reports headache. No obvious deformity on extremities. Will continue to monitor.

## 2022-02-22 NOTE — ED Notes (Signed)
Taken to CT by this RN.  

## 2022-02-22 NOTE — ED Triage Notes (Signed)
From [facility]. Pt was walking in bedroom when pt slipped and fell due to grape jelly on the floor. A&Ox 3 and at baseline due to hx of dementia. Pt reports headache due to hitting head. On Plavix.

## 2022-02-22 NOTE — ED Provider Notes (Signed)
[facility] Provider [facility]   CSN: [id number] Arrival date & time: 02/22/22  [id number]     History  Chief Complaint  Patient presents with   Cindy Padilla    Cindy Padilla is a 81 y.o. female.   Fall  Patient presents as a level 2 trauma.  Presents after fall.  Slipped on something on the ground and fell striking the back of her head.  Also pain on right side of her chest/abdomen.  Is on Plavix.  No loss conscious.  Patient was unable to get up but states she would normally not be able to get up.     Home Medications Prior to Admission medications   Medication Sig Start Date End Date Taking? Authorizing Provider  acetaminophen (TYLENOL) 325 MG tablet Take 650 mg by mouth 3 (three) times daily.   Yes [provider]  amLODipine (NORVASC) 5 MG tablet Take 5 mg by mouth 2 (two) times daily.    Yes [provider]  antiseptic oral rinse (BIOTENE) LIQD 10 mLs by Mouth Rinse route at bedtime.   Yes [provider]  atorvastatin (LIPITOR) 20 MG tablet Take 20 mg by mouth every evening.   Yes [provider]  budesonide-formoterol (SYMBICORT) 160-4.5 MCG/ACT inhaler Inhale 2 puffs into the lungs 2 (two) times daily.    Yes [provider]  Cholecalciferol (VITAMIN D) 50 MCG (2000 UT) tablet Take 2,000 Units by mouth daily.    Yes [provider]  clopidogrel (PLAVIX) 75 MG tablet Take 1 tablet (75 mg total) by mouth daily. 04/20/20  Yes Almon HerculesGonfa, Taye T, MD  donepezil (ARICEPT) 10 MG tablet Take 10 mg by mouth at bedtime.   Yes [provider]  ferrous sulfate 325 (65 FE) MG tablet Take 1 tablet (325 mg total) by mouth 2 (two) times daily with a meal. Patient taking differently: Take 325 mg by mouth daily. 04/17/20  Yes Almon HerculesGonfa, Taye T, MD  fluticasone (FLONASE) 50 MCG/ACT nasal spray Place 2 sprays into both nostrils daily.    Yes [provider]  INCRUSE ELLIPTA 62.5 MCG/INH AEPB Inhale 1  puff into the lungs daily.  04/17/19  Yes [provider]  ipratropium-albuterol (DUONEB) 0.5-2.5 (3) MG/3ML SOLN Take 3 mLs by nebulization every 6 (six) hours as needed. Patient taking differently: Take 3 mLs by nebulization every 4 (four) hours. 04/21/18  Yes Berton Mountgbata, Sylvester I, MD  lamoTRIgine (LAMICTAL) 200 MG tablet Take 200 mg by mouth 2 (two) times daily.   Yes [provider]  LORazepam (ATIVAN) 1 MG tablet Take 1 tablet (1 mg total) by mouth 2 (two) times daily. 10/16/19  Yes Elgergawy, Leana Roeawood S, MD  magnesium oxide (MAG-OX) 400 (241.3 Mg) MG tablet Take 400 mg by mouth daily.   Yes [provider]  montelukast (SINGULAIR) 10 MG tablet Take 10 mg by mouth at bedtime.   Yes [provider]  pantoprazole (PROTONIX) 20 MG tablet Take 20 mg by mouth daily.   Yes [provider]  potassium chloride (KLOR-CON) 20 MEQ packet Take 20 mEq by mouth daily.   Yes [provider]  predniSONE (DELTASONE) 10 MG tablet Take 10 mg by mouth daily. 7 day course (02/20/22-02/26/22)   Yes [provider]  QUEtiapine (SEROQUEL) 100 MG tablet Take 100-150 mg by mouth See admin instructions. 100 mg  every morning, 150 mg every evening   Yes [provider]  senna-docusate (SENOKOT-S) 8.6-50 MG per tablet Take 1  tablet by mouth 2 (two) times daily.   Yes [provider]  torsemide (DEMADEX) 20 MG tablet Take 3 tablets (60 mg total) by mouth daily. Patient taking differently: Take 80 mg by mouth daily. 10/16/19  Yes Elgergawy, Silver Huguenin, MD  LORazepam (ATIVAN) 0.5 MG tablet Take 1 tablet (0.5 mg total) by mouth daily as needed for anxiety. Patient not taking: Reported on 02/22/2022 10/16/19   Elgergawy, Silver Huguenin, MD      Allergies    Iodine, Ms contin [morphine], Aminoglycosides, Aspirin, Codeine, Ivp dye [iodinated contrast media], Neomycin, Penicillins, Phenothiazines, Promethazine, Sulfa antibiotics, and Tetracyclines & related     Review of Systems   Review of Systems  Physical Exam Updated Vital Signs BP (!) 158/85 (BP Location: Right Arm)   Pulse 80   Temp 97.9 F (36.6 C) (Axillary)   Resp 16   Ht 5' (1.524 m)   Wt 79.4 kg   SpO2 95%   BMI 34.18 kg/m  Physical Exam Vitals and nursing note reviewed.  HENT:     Head:     Comments: Mild occipital tenderness. Cardiovascular:     Rate and Rhythm: Regular rhythm.  Pulmonary:     Comments: Mild tenderness right lateral chest wall. Chest:     Chest wall: Tenderness present.  Abdominal:     Tenderness: There is abdominal tenderness.     Comments: Mild right upper quadrant tenderness without rebound or guarding.  Musculoskeletal:        General: No tenderness.     Cervical back: Neck supple. No tenderness.     Comments: No extremity tenderness.  No lumbar tenderness.  Skin:    Capillary Refill: Capillary refill takes less than 2 seconds.  Neurological:     Mental Status: She is alert and oriented to person, place, and time.     ED Results / Procedures / Treatments   Labs (all labs ordered are listed, but only abnormal results are displayed) Labs Reviewed  COMPREHENSIVE METABOLIC PANEL - Abnormal; Notable for the following components:      Result Value   Potassium 3.4 (*)    Glucose, Bld 104 (*)    Albumin 2.7 (*)    Alkaline Phosphatase 142 (*)    All other components within normal limits  CBC WITH DIFFERENTIAL/PLATELET - Abnormal; Notable for the following components:   RBC 3.78 (*)    Hemoglobin 11.5 (*)    HCT 35.2 (*)    All other components within normal limits    EKG None  Radiology CT CHEST ABDOMEN PELVIS WO CONTRAST  Result Date: 02/22/2022 CLINICAL DATA:  81 year old female status post fall on blood thinners. EXAM: CT CHEST, ABDOMEN AND PELVIS WITHOUT CONTRAST TECHNIQUE: Multidetector CT imaging of the chest, abdomen and pelvis was performed following the standard protocol without IV contrast. RADIATION DOSE REDUCTION: This  exam was performed according to the departmental dose-optimization program which includes automated exposure control, adjustment of the mA and/or kV according to patient size and/or use of iterative reconstruction technique. COMPARISON:  CT Abdomen and Pelvis 03/19/2021.  Chest CT 11/07/2017. FINDINGS: CT CHEST FINDINGS Cardiovascular: Chronic left chest generator device appears to be associated with cervical postoperative changes on the left. Calcified aortic atherosclerosis. Calcified coronary artery atherosclerosis. No cardiomegaly or pericardial effusion. Vascular patency is not evaluated in the absence of IV contrast. Mediastinum/Nodes: No mediastinal hematoma or lymphadenopathy identified in the absence of contrast. Lungs/Pleura: Mild respiratory motion. Major airways remain patent. Confluent abnormal right upper lobe lung  opacity with consolidation along the minor fissure, which appears distorted, and additional confluent peribronchial sub solid and 1 solid opacity. This most resembles pneumonia. A left lung pneumonia seen in 2018 has resolved. No pneumothorax or pleural effusion. No convincing pulmonary contusion. Musculoskeletal: Osteopenia. No acute osseous abnormality identified. CT ABDOMEN PELVIS FINDINGS Hepatobiliary: Chronically absent gallbladder. Chronic hepatic cysts with simple fluid density (no follow-up imaging recommended). No evidence of liver injury on this noncontrast exam, no perihepatic fluid. Pancreas: Negative. Spleen: Stable noncontrast appearance.  No perisplenic fluid. Adrenals/Urinary Tract: Stable adrenal glands and kidneys since last year. Bilateral punctate nephrolithiasis or renal vascular calcifications. Decompressed ureters. Moderately distended but otherwise negative urinary bladder. Chronic pelvic phleboliths. 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 Upstream stomach remains decompressed. No definite gastric or duodenal inflammation. No superimposed free air or free fluid. Gas-filled rectum but decompressed redundant upstream sigmoid colon. Some sigmoid diverticula but no active inflammation. Redundant transverse colon with mild retained gas and stool. Cecum is on a lax mesentery. No large bowel inflammation. Diminutive and normal appendix on coronal image 28. Decompressed terminal ileum. No dilated small bowel. Vascular/Lymphatic: Extensive Aortoiliac calcified atherosclerosis. Normal caliber abdominal aorta. Vascular patency is not evaluated in the absence of IV contrast. No lymphadenopathy identified. Reproductive: Chronically absent uterus.  Diminutive ovaries. Other: No pelvic free fluid. Musculoskeletal: Chronic lumbar spine degeneration with exaggerated lordosis and chronic L4-L5 spondylolisthesis. No acute osseous abnormality identified. Osteopenia. Chronic degenerative subchondral cyst at the pubic symphysis. Chronic degenerative SI joint vacuum phenomena greater on the right. IMPRESSION: 1. No acute traumatic injury identified in the noncontrast chest, abdomen, or pelvis. 2. Right Upper Lobe Pneumonia. No pleural effusion or pneumothorax. Followup PA and lateral chest X-ray is recommended in 3-4 weeks following trial of antibiotic therapy to ensure resolution and exclude underlying malignancy. 3. 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 No evidence of bowel obstruction or acute inflammatory process in the abdomen or pelvis. 4.  Aortic Atherosclerosis (ICD10-I70.0). Electronically Signed   By: Odessa Fleming M.D.   On: 02/22/2022 08:05   CT Cervical Spine Wo Contrast  Result Date: 02/22/2022 CLINICAL DATA:  81 year old female status post fall on blood thinners. EXAM: CT CERVICAL SPINE WITHOUT CONTRAST TECHNIQUE: Multidetector CT imaging  of the cervical spine was performed without intravenous contrast. Multiplanar CT image reconstructions were also generated. RADIATION DOSE REDUCTION: This exam was performed according to the departmental dose-optimization program which includes automated exposure control, adjustment of the mA and/or kV according to patient size and/or use of iterative reconstruction technique. COMPARISON:  Head CT today reported separately. Cervical spine CT 02/03/2019. FINDINGS: Alignment: Not significantly changed from 2020. Chronic straightening of cervical lordosis with degenerative appearing anterolisthesis C3-C4 greater than C7-T1. Also trace retrolisthesis of C5 on C6. Bilateral posterior element alignment is within normal limits. Skull base and vertebrae: Osteopenia. Visualized skull base is intact. No atlanto-occipital dissociation. C1 and C2 appear intact and aligned. No acute osseous abnormality identified. Soft tissues and spinal canal: No prevertebral fluid or swelling. No visible canal hematoma. Retropharyngeal course of both carotid arteries redemonstrated. Calcified carotid atherosclerosis. Multiple surgical clips in and around the left thyroid bed are chronic. Disc levels: Chronic but progressed and now severe left side C1-C2 joint space loss and degeneration with subchondral cysts and osteophytosis. Widespread cervical facet arthropathy in the  setting of multilevel mild spondylolisthesis. Advanced lower cervical disc and endplate degeneration. Up to mild associated cervical spinal stenosis. Upper chest: Osteopenia. Visible upper thoracic levels appear intact. Motion artifact in the lung apices. Chest CT today is reported separately. Other: Head CT today is reported separately. IMPRESSION: 1. No acute traumatic injury identified in the cervical spine. 2. Chronically advanced cervical spine degeneration, progressed at C1-C2 since 2020. Electronically Signed   By: Odessa Fleming M.D.   On: 02/22/2022 07:53   CT HEAD WO  CONTRAST ( )  Result Date: 02/22/2022 CLINICAL DATA:  Level 2 fall, on blood thinners EXAM: CT HEAD WITHOUT CONTRAST TECHNIQUE: Contiguous axial images were obtained from the base of the skull through the vertex without intravenous contrast. RADIATION DOSE REDUCTION: This exam was performed according to the departmental dose-optimization program which includes automated exposure control, adjustment of the mA and/or kV according to patient size and/or use of iterative reconstruction technique. COMPARISON:  Prior CT scan the head 02/03/2019; 11/23/2020 FINDINGS: Brain: No evidence of acute infarction, hemorrhage, hydrocephalus, extra-axial collection or mass lesion/mass effect. Stable cerebral cortical atrophy and moderately advanced periventricular, and subcortical white matter hypoattenuation most consistent with chronic microvascular ischemic white matter disease. Stable left basal ganglia lacunar infarct. Vascular: No hyperdense vessel or unexpected calcification. Skull: Normal. Negative for fracture or focal lesion. Sinuses/Orbits: No acute finding. Other: None. IMPRESSION: 1. No acute intracranial abnormality. 2. Stable atrophy, moderately advanced chronic microvascular ischemic white matter changes and remote left basal ganglia lacunar infarct. Electronically Signed   By: Malachy Moan M.D.   On: 02/22/2022 07:50   DG Pelvis Portable  Result Date: 02/22/2022 CLINICAL DATA:  Level 2 fall EXAM: PORTABLE PELVIS 1 VIEWS COMPARISON:  None Available. FINDINGS: No evidence of fracture or malalignment. 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 The bones appear mildly demineralized. No lytic or blastic osseous lesion. Unremarkable visualized bowel gas pattern. IMPRESSION: 1. No evidence of acute fracture or malalignment. 2. Mild right hip joint osteoarthritis. Electronically Signed   By: Malachy Moan M.D.   On: 02/22/2022 07:44   DG Chest Portable 1 View  Result Date:  02/22/2022 CLINICAL DATA:  Level 2 fall EXAM: PORTABLE CHEST 1 VIEW COMPARISON:  Prior chest x-ray 03/18/2021 FINDINGS: Spinal stimulator in stable position overlying the left chest. Cardiac and mediastinal contours are unchanged. Atherosclerotic calcifications present in the transverse aorta. Very low lung volumes. Stable bronchitic changes. Linear opacity in the right upper lobe overlying the minor fissure consistent with atelectasis. No large effusion or pneumothorax. No evidence of displaced fracture. IMPRESSION: 1. Very low inspiratory volumes with right upper lobe atelectasis. 2. No pneumothorax, large effusion or displaced fracture. 3. Aortic atherosclerotic calcifications. Electronically Signed   By: Malachy Moan M.D.   On: 02/22/2022 07:44    Procedures Procedures    Medications Ordered in ED Medications  acetaminophen (TYLENOL) tablet 500 mg (500 mg Oral Given 02/22/22 8676)    ED Course/ Medical Decision Making/ A&P                           Medical Decision Making Amount and/or Complexity of Data Reviewed Labs: ordered. Radiology: ordered.  Risk OTC drugs.   Presents after fall.  Mechanical.  Hit back of her head and does have some pain on the right side.  Level 2 trauma due to tree with Plavix.  Differential diagnosis include intracranial hemorrhage, intrathoracic and abdominal injury.  Chest x-ray and pelvis x-ray  reassuring.  Head CT cervical spine CT and chest abdomen pelvis done and reassuring without clear traumatic injury.  Was limited somewhat by lack of IV contrast however since has unknown IV dye allergy.  Has no trauma seen and reassuring hemoglobin.  No free fluid.  Appears stable.  Vitals reassuring.  Will discharge home with outpatient follow-up as needed        Final Clinical Impression(s) / ED Diagnoses Final diagnoses:  Fall, initial encounter  Minor head injury, initial encounter  Blunt trauma to abdomen, initial encounter    Rx / DC  Orders ED Discharge Orders     None         Benjiman Core, MD 02/22/22 [phone]

## 2022-02-22 NOTE — ED Notes (Addendum)
Report given to PTAR. [facility] provided this nurse number if report is requested. Previous nurse attempted to call report multiple times.  Pt left dept. Via EMS stretcher.

## 2022-06-22 ENCOUNTER — Encounter (HOSPITAL_COMMUNITY): Payer: Self-pay

## 2022-06-22 ENCOUNTER — Other Ambulatory Visit: Payer: Self-pay

## 2022-06-22 ENCOUNTER — Emergency Department (HOSPITAL_COMMUNITY): Payer: Medicare Other

## 2022-06-22 ENCOUNTER — Emergency Department (HOSPITAL_COMMUNITY)
Admission: EM | Admit: 2022-06-22 | Discharge: 2022-06-22 | Disposition: A | Discharge status: Home / Self Care | Payer: Medicare Other | Attending: Emergency Medicine | Admitting: Emergency Medicine

## 2022-06-22 DIAGNOSIS — M25551 Pain in right hip: Secondary | ICD-10-CM | POA: Diagnosis present

## 2022-06-22 DIAGNOSIS — Z7951 Long term (current) use of inhaled steroids: Secondary | ICD-10-CM | POA: Insufficient documentation

## 2022-06-22 DIAGNOSIS — Z79899 Other long term (current) drug therapy: Secondary | ICD-10-CM | POA: Insufficient documentation

## 2022-06-22 DIAGNOSIS — F0390 Unspecified dementia without behavioral disturbance: Secondary | ICD-10-CM | POA: Diagnosis not present

## 2022-06-22 DIAGNOSIS — W010XXA Fall on same level from slipping, tripping and stumbling without subsequent striking against object, initial encounter: Secondary | ICD-10-CM | POA: Diagnosis not present

## 2022-06-22 DIAGNOSIS — J449 Chronic obstructive pulmonary disease, unspecified: Secondary | ICD-10-CM | POA: Insufficient documentation

## 2022-06-22 DIAGNOSIS — Z7901 Long term (current) use of anticoagulants: Secondary | ICD-10-CM | POA: Insufficient documentation

## 2022-06-22 DIAGNOSIS — E119 Type 2 diabetes mellitus without complications: Secondary | ICD-10-CM | POA: Insufficient documentation

## 2022-06-22 DIAGNOSIS — I10 Essential (primary) hypertension: Secondary | ICD-10-CM | POA: Insufficient documentation

## 2022-06-22 DIAGNOSIS — W19XXXA Unspecified fall, initial encounter: Secondary | ICD-10-CM

## 2022-06-22 NOTE — ED Provider Notes (Signed)
[place] EMERGENCY DEPARTMENT AT [facility] Provider Note   CSN: [id number] Arrival date & time: 06/22/22  2197     History  Chief Complaint  Patient presents with   Cindy Padilla    Cindy Padilla is a 82 y.o. female.  HPI      82yo female with history of dementia, bipolar disorder, DM, htn, COPD who presents with concern for fall.   Reports she typically gets up early in the morning to wash up and was up with her walker and tripped and fell onto her right side.  The staff at the facility wanted to help her up but she wanted to wait for paramedics. Was able to bear weight with paramedics but given her age wanted do make sure she didn't break her hip.  Pain is located right hip, worse movement, palpation.  Pain mild-moderate. No head trauma, no headache, no nausea, vomiting, numbness, weakness, trouble talking or vision changes. No recent illness. No cp/dyspnea, no abdominal pain.  Reports she has bipolar and not dementia.   Past Medical History:  Diagnosis Date   Arthritis    Bipolar 1 disorder (HCC)    Bronchitis    Chronic back pain    Constipation    COPD (chronic obstructive pulmonary disease) (HCC)    Dementia (HCC)    Diabetes mellitus    GERD (gastroesophageal reflux disease)    Hiatal hernia    Hypertension    IBS (irritable bowel syndrome)     Home Medications Prior to Admission medications   Medication Sig Start Date End Date Taking? Authorizing Provider  acetaminophen (TYLENOL) 325 MG tablet Take 650 mg by mouth 3 (three) times daily.    [provider]  amLODipine (NORVASC) 5 MG tablet Take 5 mg by mouth 2 (two) times daily.     [provider]  antiseptic oral rinse (BIOTENE) LIQD 10 mLs by Mouth Rinse route at bedtime.    [provider]  atorvastatin (LIPITOR) 20 MG tablet Take 20 mg by mouth every evening.    [provider]  budesonide-formoterol (SYMBICORT) 160-4.5 MCG/ACT inhaler Inhale 2 puffs into  the lungs 2 (two) times daily.     [provider]  Cholecalciferol (VITAMIN D) 50 MCG (2000 UT) tablet Take 2,000 Units by mouth daily.     [provider]  clopidogrel (PLAVIX) 75 MG tablet Take 1 tablet (75 mg total) by mouth daily. 04/20/20   Almon Hercules, MD  donepezil (ARICEPT) 10 MG tablet Take 10 mg by mouth at bedtime.    [provider]  ferrous sulfate 325 (65 FE) MG tablet Take 1 tablet (325 mg total) by mouth 2 (two) times daily with a meal. Patient taking differently: Take 325 mg by mouth daily. 04/17/20   Almon Hercules, MD  fluticasone (FLONASE) 50 MCG/ACT nasal spray Place 2 sprays into both nostrils daily.     [provider]  INCRUSE ELLIPTA 62.5 MCG/INH AEPB Inhale 1 puff into the lungs daily.  04/17/19   [provider]  ipratropium-albuterol (DUONEB) 0.5-2.5 (3) MG/3ML SOLN Take 3 mLs by nebulization every 6 (six) hours as needed. Patient taking differently: Take 3 mLs by nebulization every 4 (four) hours. 04/21/18   Barnetta Chapel, MD  lamoTRIgine (LAMICTAL) 200 MG tablet Take 200 mg by mouth 2 (two) times daily.    [provider]  LORazepam (ATIVAN) 0.5 MG tablet Take 1 tablet (0.5 mg total) by mouth daily as needed for  anxiety. Patient not taking: Reported on 02/22/2022 10/16/19   Elgergawy, Silver Huguenin, MD  LORazepam (ATIVAN) 1 MG tablet Take 1 tablet (1 mg total) by mouth 2 (two) times daily. 10/16/19   Elgergawy, Silver Huguenin, MD  magnesium oxide (MAG-OX) 400 (241.3 Mg) MG tablet Take 400 mg by mouth daily.    [provider]  montelukast (SINGULAIR) 10 MG tablet Take 10 mg by mouth at bedtime.    [provider]  pantoprazole (PROTONIX) 20 MG tablet Take 20 mg by mouth daily.    [provider]  potassium chloride (KLOR-CON) 20 MEQ packet Take 20 mEq by mouth daily.    [provider]  predniSONE (DELTASONE) 10 MG tablet Take 10 mg by mouth daily. 7 day course (02/20/22-02/26/22)     [provider]  QUEtiapine (SEROQUEL) 100 MG tablet Take 100-150 mg by mouth See admin instructions. 100 mg  every morning, 150 mg every evening    [provider]  senna-docusate (SENOKOT-S) 8.6-50 MG per tablet Take 1 tablet by mouth 2 (two) times daily.    [provider]  torsemide (DEMADEX) 20 MG tablet Take 3 tablets (60 mg total) by mouth daily. Patient taking differently: Take 80 mg by mouth daily. 10/16/19   Elgergawy, Silver Huguenin, MD      Allergies    Iodine, Ms contin [morphine], Aminoglycosides, Aspirin, Codeine, Ivp dye [iodinated contrast media], Neomycin, Penicillins, Phenothiazines, Promethazine, Sulfa antibiotics, and Tetracyclines & related    Review of Systems   Review of Systems  Physical Exam Updated Vital Signs BP (!) 155/77 (BP Location: Right Arm)   Pulse 65   Temp 98.1 F (36.7 C) (Oral)   Resp 18   Ht 5' (1.524 m)   Wt 79.4 kg   SpO2 96%   BMI 34.18 kg/m  Physical Exam Vitals and nursing note reviewed.  Constitutional:      General: She is not in acute distress.    Appearance: She is well-developed. She is not diaphoretic.  HENT:     Head: Normocephalic and atraumatic.  Eyes:     Conjunctiva/sclera: Conjunctivae normal.  Cardiovascular:     Rate and Rhythm: Normal rate and regular rhythm.     Heart sounds: Normal heart sounds. No murmur heard.    No friction rub. No gallop.  Pulmonary:     Effort: Pulmonary effort is normal. No respiratory distress.     Breath sounds: Normal breath sounds. No wheezing or rales.  Abdominal:     General: There is no distension.     Palpations: Abdomen is soft.     Tenderness: There is no abdominal tenderness. There is no guarding.  Musculoskeletal:        General: Tenderness (right hip) present.     Cervical back: Normal range of motion.  Skin:    General: Skin is warm and dry.     Findings: No erythema or rash.  Neurological:     Mental Status: She is alert and oriented to person,  place, and time.     ED Results / Procedures / Treatments   Labs (all labs ordered are listed, but only abnormal results are displayed) Labs Reviewed - No data to display  EKG None  Radiology No results found.  Procedures Procedures    Medications Ordered in ED Medications - No data to display  ED Course/ Medical Decision Making/ A&P  82yo female with history of dementia, bipolar disorder, DM, htn, COPD who presents with concern for fall with right hip pain.  Denies other medical concerns. No head trauma, no headache, no nausea, vomiting or neurologic symptoms and doubt ICH.    XR hip shows no evidence of fracture. Able to bear weight and ambulate with walker.  Low suspicion at this time for occult hip fracture.  Suspect likely contusion. Recommend continued supportive care and weight bearing as tolerated.         Final Clinical Impression(s) / ED Diagnoses Final diagnoses:  None    Rx / DC Orders ED Discharge Orders     None         Gareth Morgan, MD 06/22/22 2236

## 2022-06-22 NOTE — ED Triage Notes (Signed)
Pt c/o right hip/leg pain. Pt reports she got up to go to the bathroom, lost her balance and fell on the floor.

## 2022-06-22 NOTE — ED Notes (Signed)
Per pt, pt ambulates w walker at baseline. Pt ambulated up and down the hallway utilizing RW w/ supervision for safety. No LOB noted. Pt c/o slight R hip pain. No other complaints

## 2022-06-22 NOTE — ED Notes (Signed)
PTAR at bedside

## 2023-05-22 ENCOUNTER — Emergency Department (HOSPITAL_COMMUNITY): Payer: Medicare Other

## 2023-05-22 ENCOUNTER — Other Ambulatory Visit: Payer: Self-pay

## 2023-05-22 ENCOUNTER — Encounter (HOSPITAL_COMMUNITY): Payer: Self-pay

## 2023-05-22 ENCOUNTER — Emergency Department (HOSPITAL_COMMUNITY)
Admission: EM | Admit: 2023-05-22 | Discharge: 2023-05-22 | Disposition: A | Discharge status: Home / Self Care | Payer: Medicare Other | Source: Home / Self Care | Attending: Emergency Medicine | Admitting: Emergency Medicine

## 2023-05-22 DIAGNOSIS — S0990XA Unspecified injury of head, initial encounter: Secondary | ICD-10-CM | POA: Insufficient documentation

## 2023-05-22 DIAGNOSIS — W19XXXA Unspecified fall, initial encounter: Secondary | ICD-10-CM | POA: Insufficient documentation

## 2023-05-22 DIAGNOSIS — B338 Other specified viral diseases: Secondary | ICD-10-CM | POA: Diagnosis not present

## 2023-05-22 DIAGNOSIS — J441 Chronic obstructive pulmonary disease with (acute) exacerbation: Secondary | ICD-10-CM | POA: Insufficient documentation

## 2023-05-22 DIAGNOSIS — J329 Chronic sinusitis, unspecified: Secondary | ICD-10-CM | POA: Insufficient documentation

## 2023-05-22 LAB — BASIC METABOLIC PANEL
Anion gap: 13 (ref 5–15)
BUN: 20 mg/dL (ref 8–23)
CO2: 24 mmol/L (ref 22–32)
Calcium: 9 mg/dL (ref 8.9–10.3)
Chloride: 94 mmol/L — ABNORMAL LOW (ref 98–111)
Creatinine, Ser: 1.18 mg/dL — ABNORMAL HIGH (ref 0.44–1.00)
GFR, Estimated: 46 mL/min — ABNORMAL LOW (ref >60)
Glucose, Bld: 104 mg/dL — ABNORMAL HIGH (ref 70–99)
Potassium: 3.2 mmol/L — ABNORMAL LOW (ref 3.5–5.1)
Sodium: 131 mmol/L — ABNORMAL LOW (ref 135–145)

## 2023-05-22 LAB — CBC WITH DIFFERENTIAL/PLATELET
Abs Immature Granulocytes: 0.02 10*3/uL (ref 0.00–0.07)
Basophils Absolute: 0 10*3/uL (ref 0.0–0.1)
Basophils Relative: 0 %
Eosinophils Absolute: 0 10*3/uL (ref 0.0–0.5)
Eosinophils Relative: 0 %
HCT: 33.4 % — ABNORMAL LOW (ref 36.0–46.0)
Hemoglobin: 11.4 g/dL — ABNORMAL LOW (ref 12.0–15.0)
Immature Granulocytes: 0 %
Lymphocytes Relative: 14 %
Lymphs Abs: 1.3 10*3/uL (ref 0.7–4.0)
MCH: 31.7 pg (ref 26.0–34.0)
MCHC: 34.1 g/dL (ref 30.0–36.0)
MCV: 92.8 fL (ref 80.0–100.0)
Monocytes Absolute: 1.5 10*3/uL — ABNORMAL HIGH (ref 0.1–1.0)
Monocytes Relative: 17 %
Neutro Abs: 6.3 10*3/uL (ref 1.7–7.7)
Neutrophils Relative %: 69 %
Platelets: 203 10*3/uL (ref 150–400)
RBC: 3.6 MIL/uL — ABNORMAL LOW (ref 3.87–5.11)
RDW: 15 % (ref 11.5–15.5)
WBC: 9.2 10*3/uL (ref 4.0–10.5)
nRBC: 0 % (ref 0.0–0.2)

## 2023-05-22 MED ORDER — LEVOFLOXACIN 500 MG PO TABS: 500.0000 mg | ORAL_TABLET | Freq: Every day | ORAL | 0 refills | Status: DC

## 2023-05-22 MED ORDER — ACETAMINOPHEN 325 MG PO TABS
650.0000 mg | ORAL_TABLET | Freq: Once | ORAL | Status: AC
  Administered 2023-05-22: 650 mg via ORAL
  Filled 2023-05-22: qty 2

## 2023-05-22 MED ORDER — SODIUM CHLORIDE 0.9 % IV BOLUS
500.0000 mL | Freq: Once | INTRAVENOUS | Status: AC
  Administered 2023-05-22: 500 mL via INTRAVENOUS

## 2023-05-22 MED ORDER — POTASSIUM CHLORIDE 20 MEQ PO PACK
60.0000 meq | PACK | Freq: Once | ORAL | Status: AC
  Administered 2023-05-22: 60 meq via ORAL
  Filled 2023-05-22: qty 3

## 2023-05-22 NOTE — ED Notes (Signed)
Patient transported to CT 

## 2023-05-22 NOTE — ED Provider Notes (Signed)
[facility] EM[facility]MENT AT [facility] Provider Note   CSN: [id number] Arrival date & time: 05/22/23  1117     History  Chief Complaint  Patient presents with   Marletta Lor    Cindy Padilla is a 82 y.o. female.  82 year old female presents today for a fall that occurred earlier today.  She was walking to get her walker when she lost her balance and fell backwards.  She states a jug full of ice fell and hit her on top of the head.  No loss of consciousness.  Also complains of a productive cough.  Unable to clearly discern but this does not appear to be new.  No chest pain or shortness of breath.  She does have history of asthma/COPD.  The history is provided by the patient. No language interpreter was used.       Home Medications Prior to Admission medications   Medication Sig Start Date End Date Taking? Authorizing Provider  acetaminophen (TYLENOL) 325 MG tablet Take 650 mg by mouth 3 (three) times daily.   Yes [provider]  albuterol (VENTOLIN HFA) 108 (90 Base) MCG/ACT inhaler Inhale 2 puffs into the lungs every 4 (four) hours as needed for wheezing or shortness of breath. 05/12/23  Yes [provider]  amLODipine (NORVASC) 5 MG tablet Take 5 mg by mouth daily.   Yes [provider]  antiseptic oral rinse (BIOTENE) LIQD 10 mLs by Mouth Rinse route at bedtime.   Yes [provider]  atorvastatin (LIPITOR) 20 MG tablet Take 20 mg by mouth every evening.   Yes [provider]  baclofen (LIORESAL) 10 MG tablet Take 10 mg by mouth 2 (two) times daily.   Yes [provider]  BREO ELLIPTA 200-25 MCG/ACT AEPB Inhale 1 puff into the lungs daily. 04/21/23  Yes [provider]  budesonide-formoterol (SYMBICORT) 160-4.5 MCG/ACT inhaler Inhale 1 puff into the lungs daily.   Yes [provider]  Cholecalciferol (VITAMIN D) 50 MCG (2000 UT) tablet Take 2,000 Units by mouth daily.    Yes [provider]  diclofenac Sodium (VOLTAREN) 1 % GEL Apply 2 g topically 4 (four) times daily as needed (Pain).   Yes [provider]  donepezil (ARICEPT) 10 MG tablet Take 10 mg by mouth at bedtime.   Yes [provider]  ferrous sulfate 325 (65 FE) MG tablet Take 1 tablet (325 mg total) by mouth 2 (two) times daily with a meal. Patient taking differently: Take 325 mg by mouth daily. 04/17/20  Yes Almon Hercules, MD  fluticasone (FLONASE) 50 MCG/ACT nasal spray Place 2 sprays into both nostrils daily.    Yes [provider]  guaifenesin (ROBITUSSIN) 100 MG/5ML syrup Take 200 mg by mouth 4 (four) times daily as needed for cough.   Yes [provider]  hydrocortisone 2.5 % cream Apply 1 Application topically daily as needed (Irritation). 05/12/23  Yes [provider]  INCRUSE ELLIPTA 62.5 MCG/INH AEPB Inhale 1 puff into the lungs daily.  04/17/19  Yes [provider]  ipratropium-albuterol (DUONEB) 0.5-2.5 (3) MG/3ML SOLN Take 3 mLs by nebulization every 6 (six) hours as needed. Patient taking differently: Take 3 mLs by nebulization every 4 (four) hours as needed (Wheezing/SOB). 04/21/18  Yes Berton Mount I, MD  lamoTRIgine (LAMICTAL) 200 MG tablet Take 200 mg by mouth 2 (two) times daily.   Yes [provider]  loperamide (IMODIUM A-D) 2 MG tablet Take 2 mg by  mouth every 4 (four) hours as needed for diarrhea or loose stools.   Yes [provider]  LORazepam (ATIVAN) 1 MG tablet Take 1 tablet (1 mg total) by mouth 2 (two) times daily. 10/16/19  Yes Elgergawy, Leana Roe, MD  magnesium oxide (MAG-OX) 400 (241.3 Mg) MG tablet Take 400 mg by mouth daily.   Yes [provider]  montelukast (SINGULAIR) 10 MG tablet Take 10 mg by mouth at bedtime.   Yes [provider]  [facility] powder Apply 1 Application topically 2 (two) times daily as needed (Rash). 05/12/23  Yes [provider]  polyethylene glycol  (MIRALAX / GLYCOLAX) 17 g packet Take 17 g by mouth 2 (two) times daily as needed for moderate constipation.   Yes [provider]  potassium chloride (KLOR-CON) 20 MEQ packet Take 20 mEq by mouth daily.   Yes [provider]  potassium chloride SA (KLOR-CON M) 20 MEQ tablet Take 20 mEq by mouth daily. 04/21/23  Yes [provider]  QUEtiapine (SEROQUEL) 100 MG tablet Take 100-150 mg by mouth See admin instructions. Take 100mg  (1 tablet) by mouth every morning, and 150mg  (1 and 1/2 tablet) every evening.   Yes [provider]  senna-docusate (SENOKOT-S) 8.6-50 MG per tablet Take 1 tablet by mouth 2 (two) times daily.   Yes [provider]  torsemide (DEMADEX) 20 MG tablet Take 3 tablets (60 mg total) by mouth daily. Patient taking differently: Take 80 mg by mouth daily. 10/16/19  Yes Elgergawy, Leana Roe, MD  clopidogrel (PLAVIX) 75 MG tablet Take 1 tablet (75 mg total) by mouth daily. 04/20/20   Almon Hercules, MD      Allergies    Sulfa antibiotics, Iodine, Ms contin [morphine], Aminoglycosides, Aspirin, Codeine, Ivp dye [iodinated contrast media], Neomycin, Penicillins, Phenothiazines, Promethazine, and Tetracyclines & related    Review of Systems   Review of Systems  Constitutional:  Negative for chills and fever.  Respiratory:  Positive for cough. Negative for shortness of breath.   Cardiovascular:  Negative for chest pain.  Gastrointestinal:  Negative for abdominal pain.  All other systems reviewed and are negative.   Physical Exam Updated Vital Signs BP 133/60 (BP Location: Right Arm)   Pulse 68   Temp 98.2 F (36.8 C) (Oral)   Resp 16   SpO2 95%  Physical Exam Vitals and nursing note reviewed.  Constitutional:      General: She is not in acute distress.    Appearance: Normal appearance. She is not ill-appearing.  HENT:     Head: Normocephalic and atraumatic.     Nose: Nose normal.  Eyes:     General: No scleral icterus.     Extraocular Movements: Extraocular movements intact.     Conjunctiva/sclera: Conjunctivae normal.  Cardiovascular:     Rate and Rhythm: Normal rate and regular rhythm.     Heart sounds: Normal heart sounds.  Pulmonary:     Effort: Pulmonary effort is normal. No respiratory distress.     Breath sounds: Normal breath sounds. No wheezing or rales.  Abdominal:     General: There is no distension.     Palpations: Abdomen is soft.     Tenderness: There is no abdominal tenderness. There is no guarding.  Musculoskeletal:        General: Normal range of motion.     Cervical back: Normal range of motion.     Comments: Cervical, thoracic and lumbar spine without tenderness palpation.  Full range of  motion bilateral upper and lower extremities.  All major joints of upper and lower extremities. without tenderness to palpation.  Skin:    General: Skin is warm and dry.  Neurological:     General: No focal deficit present.     Mental Status: She is alert. Mental status is at baseline.     ED Results / Procedures / Treatments   Labs (all labs ordered are listed, but only abnormal results are displayed) Labs Reviewed  CBC WITH DIFFERENTIAL/PLATELET - Abnormal; Notable for the following components:      Result Value   RBC 3.60 (*)    Hemoglobin 11.4 (*)    HCT 33.4 (*)    Monocytes Absolute 1.5 (*)    All other components within normal limits  BASIC METABOLIC PANEL    EKG None  Radiology No results found.  Procedures Procedures    Medications Ordered in ED Medications - No data to display  ED Course/ Medical Decision Making/ A&P Clinical Course as of 05/22/23 1313  Thu May 22, 2023  1233 CBC without leukocytosis.  Mild anemia around her baseline.  BMP shows potassium of 3.2.  Repletion given.  Creatinine 1.18.  Slightly above her baseline.  Will give 500 mL bolus.  Conservative hydration due to history of diastolic heart failure. [AA]    Clinical Course User Index [AA] Marita Kansas, PA-C                                 Medical Decision Making Amount and/or Complexity of Data Reviewed Labs: ordered. Radiology: ordered.  Risk OTC drugs. Prescription drug management.   Medical Decision Making / ED Course   This patient presents to the ED for concern of fall, cough this involves an extensive number of treatment options, and is a complaint that carries with it a high risk of complications and morbidity.  The differential diagnosis includes intracranial bleed, concussion, other traumatic injury including fracture, pneumonia, viral URI, COPD exacerbation  MDM: 82 year old female presents today for concern of a fall.  This was an unwitnessed fall.  She does state that she had a check her on the head.  No loss of consciousness.  Not on any blood thinning medicine.  Exam overall reassuring.  Will obtain CT head, cervical spine.  Will add on chest x-ray due to cough and pelvic x-ray obtained.  There is nothing concerning leading to the fall or from the fall in the pelvis.  Following discussion she states she has had sinus congestion with postnasal drip now for "days".   Chest x-ray and pelvic x-ray without acute concern.  Given her history of COPD.  She endorses feeling dizzy and the postnasal drip will start her on Levaquin given all of her other allergies to antibiotics.  Pending CT head and cervical spine at the end of my shift.  Signed out to Dr. Wilkie Aye.   Lab Tests: -I ordered, reviewed, and interpreted labs.   The pertinent results include:   Labs Reviewed  CBC WITH DIFFERENTIAL/PLATELET - Abnormal; Notable for the following components:      Result Value   RBC 3.60 (*)    Hemoglobin 11.4 (*)    HCT 33.4 (*)    Monocytes Absolute 1.5 (*)    All other components within normal limits  BASIC METABOLIC PANEL      EKG  EKG Interpretation Date/Time:    Ventricular Rate:    PR  Interval:    QRS Duration:    QT Interval:    QTC Calculation:   R  Axis:      Text Interpretation:           Imaging Studies ordered: I ordered imaging studies including CT head, CT cervical spine, pelvic x-ray, chest x-ray I independently visualized and interpreted imaging. I agree with the radiologist interpretation   Medicines ordered and prescription drug management: No orders of the defined types were placed in this encounter.   -I have reviewed the patients home medicines and have made adjustments as needed  Critical interventions Fluids, potassium repletion   Cardiac Monitoring: The patient was maintained on a cardiac monitor.  I personally viewed and interpreted the cardiac monitored which showed an underlying rhythm of: Normal sinus rhythm  Reevaluation: After the interventions noted above, I reevaluated the patient and found that they have :stayed the same  Co morbidities that complicate the patient evaluation  Past Medical History:  Diagnosis Date   Arthritis    Bipolar 1 disorder (HCC)    Bronchitis    Chronic back pain    Constipation    COPD (chronic obstructive pulmonary disease) (HCC)    Dementia (HCC)    Diabetes mellitus    GERD (gastroesophageal reflux disease)    Hiatal hernia    Hypertension    IBS (irritable bowel syndrome)       Dispostion: Signout to Dr. Wilkie Aye at the end of my shift.   Final Clinical Impression(s) / ED Diagnoses Final diagnoses:  Fall, initial encounter  COPD exacerbation (HCC)  Sinusitis, unspecified chronicity, unspecified location    Rx / DC Orders ED Discharge Orders          Ordered    levofloxacin (LEVAQUIN) 500 MG tablet  Daily        05/22/23 1329              Marita Kansas, PA-C 05/22/23 1331    Horton, Clabe Seal, DO 05/22/23 1553

## 2023-05-22 NOTE — ED Notes (Signed)
[facility] notified for transport 

## 2023-05-22 NOTE — ED Triage Notes (Signed)
BIBA from [place] for an unwitnessed fall. Pt slid to the floor trying to get into bed, hit the back of her head with a mug. Pt also reports cough with yellow mucous. 130/60 BP 95% Hx of COPD 91 HR

## 2023-05-22 NOTE — ED Provider Notes (Signed)
Patient signed out to me by previous provider. Please refer to their note for full HPI.  Briefly this is a an 82 year old female with a mechanical fall at nursing facility.  Pending CT imaging.  CT imaging is negative from a traumatic standpoint.  On reevaluation patient feels well and is requesting discharge.  Patient at this time appears safe and stable for discharge and close outpatient follow up. Discharge plan and strict return to ED precautions discussed, patient verbalizes understanding and agreement.   Rozelle Logan, DO 05/22/23 1533

## 2023-05-22 NOTE — Discharge Instructions (Signed)
Your workup was reassuring.  X-rays did not show any concerning findings.  CT scans did not show any concerning findings.  Your blood work shows slightly decreased potassium.  You received a potassium supplement in the emergency department. Your kidney function was slightly elevated.  He received IV fluids for this.

## 2023-05-24 ENCOUNTER — Encounter (HOSPITAL_COMMUNITY): Payer: Self-pay | Admitting: Emergency Medicine

## 2023-05-24 ENCOUNTER — Inpatient Hospital Stay (HOSPITAL_COMMUNITY)
Admission: EM | Admit: 2023-05-24 | Discharge: 2023-05-29 | DRG: 190 | Disposition: A | Discharge status: Skilled Nursing Facility | Payer: Medicare Other | Source: Skilled Nursing Facility | Attending: Internal Medicine | Admitting: Internal Medicine

## 2023-05-24 ENCOUNTER — Emergency Department (HOSPITAL_COMMUNITY): Payer: Medicare Other

## 2023-05-24 ENCOUNTER — Inpatient Hospital Stay (HOSPITAL_COMMUNITY): Payer: Medicare Other

## 2023-05-24 ENCOUNTER — Other Ambulatory Visit: Payer: Self-pay

## 2023-05-24 DIAGNOSIS — J441 Chronic obstructive pulmonary disease with (acute) exacerbation: Principal | ICD-10-CM | POA: Diagnosis present

## 2023-05-24 DIAGNOSIS — Z7951 Long term (current) use of inhaled steroids: Secondary | ICD-10-CM

## 2023-05-24 DIAGNOSIS — I350 Nonrheumatic aortic (valve) stenosis: Secondary | ICD-10-CM | POA: Diagnosis not present

## 2023-05-24 DIAGNOSIS — Z79899 Other long term (current) drug therapy: Secondary | ICD-10-CM

## 2023-05-24 DIAGNOSIS — F0393 Unspecified dementia, unspecified severity, with mood disturbance: Secondary | ICD-10-CM | POA: Diagnosis present

## 2023-05-24 DIAGNOSIS — F03918 Unspecified dementia, unspecified severity, with other behavioral disturbance: Secondary | ICD-10-CM | POA: Diagnosis present

## 2023-05-24 DIAGNOSIS — J440 Chronic obstructive pulmonary disease with acute lower respiratory infection: Secondary | ICD-10-CM | POA: Diagnosis present

## 2023-05-24 DIAGNOSIS — K219 Gastro-esophageal reflux disease without esophagitis: Secondary | ICD-10-CM | POA: Diagnosis present

## 2023-05-24 DIAGNOSIS — Z87891 Personal history of nicotine dependence: Secondary | ICD-10-CM

## 2023-05-24 DIAGNOSIS — Z7902 Long term (current) use of antithrombotics/antiplatelets: Secondary | ICD-10-CM

## 2023-05-24 DIAGNOSIS — Z66 Do not resuscitate: Secondary | ICD-10-CM | POA: Diagnosis present

## 2023-05-24 DIAGNOSIS — M549 Dorsalgia, unspecified: Secondary | ICD-10-CM | POA: Diagnosis present

## 2023-05-24 DIAGNOSIS — I110 Hypertensive heart disease with heart failure: Secondary | ICD-10-CM | POA: Diagnosis present

## 2023-05-24 DIAGNOSIS — E785 Hyperlipidemia, unspecified: Secondary | ICD-10-CM | POA: Diagnosis present

## 2023-05-24 DIAGNOSIS — G8929 Other chronic pain: Secondary | ICD-10-CM | POA: Diagnosis present

## 2023-05-24 DIAGNOSIS — M1990 Unspecified osteoarthritis, unspecified site: Secondary | ICD-10-CM | POA: Diagnosis present

## 2023-05-24 DIAGNOSIS — Z881 Allergy status to other antibiotic agents status: Secondary | ICD-10-CM | POA: Diagnosis not present

## 2023-05-24 DIAGNOSIS — E871 Hypo-osmolality and hyponatremia: Secondary | ICD-10-CM | POA: Diagnosis present

## 2023-05-24 DIAGNOSIS — N289 Disorder of kidney and ureter, unspecified: Secondary | ICD-10-CM | POA: Diagnosis present

## 2023-05-24 DIAGNOSIS — K589 Irritable bowel syndrome without diarrhea: Secondary | ICD-10-CM | POA: Diagnosis present

## 2023-05-24 DIAGNOSIS — J205 Acute bronchitis due to respiratory syncytial virus: Secondary | ICD-10-CM | POA: Diagnosis present

## 2023-05-24 DIAGNOSIS — Z91041 Radiographic dye allergy status: Secondary | ICD-10-CM

## 2023-05-24 DIAGNOSIS — F319 Bipolar disorder, unspecified: Secondary | ICD-10-CM | POA: Diagnosis present

## 2023-05-24 DIAGNOSIS — E119 Type 2 diabetes mellitus without complications: Secondary | ICD-10-CM | POA: Diagnosis present

## 2023-05-24 DIAGNOSIS — G9341 Metabolic encephalopathy: Secondary | ICD-10-CM | POA: Insufficient documentation

## 2023-05-24 DIAGNOSIS — Z880 Allergy status to penicillin: Secondary | ICD-10-CM

## 2023-05-24 DIAGNOSIS — J9601 Acute respiratory failure with hypoxia: Secondary | ICD-10-CM | POA: Diagnosis present

## 2023-05-24 DIAGNOSIS — I060 Rheumatic aortic stenosis: Secondary | ICD-10-CM | POA: Diagnosis present

## 2023-05-24 DIAGNOSIS — B338 Other specified viral diseases: Secondary | ICD-10-CM

## 2023-05-24 DIAGNOSIS — Z888 Allergy status to other drugs, medicaments and biological substances status: Secondary | ICD-10-CM

## 2023-05-24 DIAGNOSIS — I5032 Chronic diastolic (congestive) heart failure: Secondary | ICD-10-CM | POA: Diagnosis present

## 2023-05-24 DIAGNOSIS — J159 Unspecified bacterial pneumonia: Secondary | ICD-10-CM | POA: Diagnosis present

## 2023-05-24 DIAGNOSIS — Z886 Allergy status to analgesic agent status: Secondary | ICD-10-CM

## 2023-05-24 DIAGNOSIS — I10 Essential (primary) hypertension: Secondary | ICD-10-CM | POA: Diagnosis present

## 2023-05-24 DIAGNOSIS — Z825 Family history of asthma and other chronic lower respiratory diseases: Secondary | ICD-10-CM

## 2023-05-24 DIAGNOSIS — Z90711 Acquired absence of uterus with remaining cervical stump: Secondary | ICD-10-CM

## 2023-05-24 DIAGNOSIS — Z882 Allergy status to sulfonamides status: Secondary | ICD-10-CM

## 2023-05-24 LAB — COMPREHENSIVE METABOLIC PANEL
ALT: 19 U/L (ref 0–44)
AST: 28 U/L (ref 15–41)
Albumin: 3.6 g/dL (ref 3.5–5.0)
Alkaline Phosphatase: 93 U/L (ref 38–126)
Anion gap: 13 (ref 5–15)
BUN: 25 mg/dL — ABNORMAL HIGH (ref 8–23)
CO2: 25 mmol/L (ref 22–32)
Calcium: 9.2 mg/dL (ref 8.9–10.3)
Chloride: 97 mmol/L — ABNORMAL LOW (ref 98–111)
Creatinine, Ser: 1.4 mg/dL — ABNORMAL HIGH (ref 0.44–1.00)
GFR, Estimated: 37 mL/min — ABNORMAL LOW (ref >60)
Glucose, Bld: 128 mg/dL — ABNORMAL HIGH (ref 70–99)
Potassium: 3.5 mmol/L (ref 3.5–5.1)
Sodium: 135 mmol/L (ref 135–145)
Total Bilirubin: 0.7 mg/dL (ref <1.2)
Total Protein: 7.4 g/dL (ref 6.5–8.1)

## 2023-05-24 LAB — CBC
HCT: 33.8 % — ABNORMAL LOW (ref 36.0–46.0)
Hemoglobin: 10.9 g/dL — ABNORMAL LOW (ref 12.0–15.0)
MCH: 30.4 pg (ref 26.0–34.0)
MCHC: 32.2 g/dL (ref 30.0–36.0)
MCV: 94.2 fL (ref 80.0–100.0)
Platelets: 213 10*3/uL (ref 150–400)
RBC: 3.59 MIL/uL — ABNORMAL LOW (ref 3.87–5.11)
RDW: 14.9 % (ref 11.5–15.5)
WBC: 6.9 10*3/uL (ref 4.0–10.5)
nRBC: 0 % (ref 0.0–0.2)

## 2023-05-24 LAB — BLOOD GAS, VENOUS
Acid-Base Excess: 1 mmol/L (ref 0.0–2.0)
Bicarbonate: 26.6 mmol/L (ref 20.0–28.0)
O2 Saturation: 87.1 %
Patient temperature: 37
pCO2, Ven: 45 mm[Hg] (ref 44–60)
pH, Ven: 7.38 (ref 7.25–7.43)
pO2, Ven: 56 mm[Hg] — ABNORMAL HIGH (ref 32–45)

## 2023-05-24 LAB — URINALYSIS, W/ REFLEX TO CULTURE (INFECTION SUSPECTED)
Bacteria, UA: NONE SEEN
Bilirubin Urine: NEGATIVE
Glucose, UA: NEGATIVE mg/dL
Ketones, ur: NEGATIVE mg/dL
Leukocytes,Ua: NEGATIVE
Nitrite: NEGATIVE
Protein, ur: NEGATIVE mg/dL
Specific Gravity, Urine: 1.01 (ref 1.005–1.030)
pH: 7 (ref 5.0–8.0)

## 2023-05-24 LAB — CBC WITH DIFFERENTIAL/PLATELET
Abs Immature Granulocytes: 0.04 10*3/uL (ref 0.00–0.07)
Basophils Absolute: 0 10*3/uL (ref 0.0–0.1)
Basophils Relative: 0 %
Eosinophils Absolute: 0 10*3/uL (ref 0.0–0.5)
Eosinophils Relative: 0 %
HCT: 35.6 % — ABNORMAL LOW (ref 36.0–46.0)
Hemoglobin: 11.5 g/dL — ABNORMAL LOW (ref 12.0–15.0)
Immature Granulocytes: 0 %
Lymphocytes Relative: 25 %
Lymphs Abs: 2.5 10*3/uL (ref 0.7–4.0)
MCH: 29.7 pg (ref 26.0–34.0)
MCHC: 32.3 g/dL (ref 30.0–36.0)
MCV: 92 fL (ref 80.0–100.0)
Monocytes Absolute: 1 10*3/uL (ref 0.1–1.0)
Monocytes Relative: 10 %
Neutro Abs: 6.3 10*3/uL (ref 1.7–7.7)
Neutrophils Relative %: 65 %
Platelets: 212 10*3/uL (ref 150–400)
RBC: 3.87 MIL/uL (ref 3.87–5.11)
RDW: 15.1 % (ref 11.5–15.5)
WBC: 9.9 10*3/uL (ref 4.0–10.5)
nRBC: 0 % (ref 0.0–0.2)

## 2023-05-24 LAB — CREATININE, SERUM
Creatinine, Ser: 1.21 mg/dL — ABNORMAL HIGH (ref 0.44–1.00)
GFR, Estimated: 44 mL/min — ABNORMAL LOW (ref >60)

## 2023-05-24 LAB — AMMONIA: Ammonia: 25 umol/L (ref 9–35)

## 2023-05-24 LAB — I-STAT CG4 LACTIC ACID, ED: Lactic Acid, Venous: 1.8 mmol/L (ref 0.5–1.9)

## 2023-05-24 LAB — TROPONIN I (HIGH SENSITIVITY)
Troponin I (High Sensitivity): 26 ng/L — ABNORMAL HIGH (ref <18)
Troponin I (High Sensitivity): 19 ng/L — ABNORMAL HIGH (ref <18)

## 2023-05-24 LAB — BRAIN NATRIURETIC PEPTIDE: B Natriuretic Peptide: 129.8 pg/mL — ABNORMAL HIGH (ref 0.0–100.0)

## 2023-05-24 LAB — RESP PANEL BY RT-PCR (RSV, FLU A&B, COVID)  RVPGX2
Influenza A by PCR: NEGATIVE
Influenza B by PCR: NEGATIVE
Resp Syncytial Virus by PCR: POSITIVE — AB
SARS Coronavirus 2 by RT PCR: NEGATIVE

## 2023-05-24 LAB — CBG MONITORING, ED: Glucose-Capillary: 179 mg/dL — ABNORMAL HIGH (ref 70–99)

## 2023-05-24 LAB — PROCALCITONIN: Procalcitonin: 0.24 ng/mL

## 2023-05-24 LAB — C-REACTIVE PROTEIN: CRP: 18.6 mg/dL — ABNORMAL HIGH (ref <1.0)

## 2023-05-24 LAB — VITAMIN B12: Vitamin B-12: 203 pg/mL (ref 180–914)

## 2023-05-24 MED ORDER — ENOXAPARIN SODIUM 30 MG/0.3ML IJ SOSY
30.0000 mg | PREFILLED_SYRINGE | Freq: Every day | INTRAMUSCULAR | Status: DC
  Administered 2023-05-24 – 2023-05-25 (×2): 30 mg via SUBCUTANEOUS
  Filled 2023-05-24 (×2): qty 0.3

## 2023-05-24 MED ORDER — LORAZEPAM 1 MG PO TABS
1.0000 mg | ORAL_TABLET | Freq: Two times a day (BID) | ORAL | Status: DC | PRN
  Administered 2023-05-25: 1 mg via ORAL
  Filled 2023-05-24: qty 1

## 2023-05-24 MED ORDER — SODIUM CHLORIDE 0.9% FLUSH
3.0000 mL | Freq: Two times a day (BID) | INTRAVENOUS | Status: DC
  Administered 2023-05-24 – 2023-05-29 (×11): 3 mL via INTRAVENOUS

## 2023-05-24 MED ORDER — QUETIAPINE FUMARATE 25 MG PO TABS: 150.0000 mg | ORAL_TABLET | Freq: Every day | ORAL | Status: DC

## 2023-05-24 MED ORDER — BACLOFEN 10 MG PO TABS: 5.0000 mg | ORAL_TABLET | Freq: Two times a day (BID) | ORAL | Status: DC

## 2023-05-24 MED ORDER — LAMOTRIGINE 100 MG PO TABS
200.0000 mg | ORAL_TABLET | Freq: Two times a day (BID) | ORAL | Status: DC
  Administered 2023-05-25 – 2023-05-29 (×9): 200 mg via ORAL
  Filled 2023-05-24 (×10): qty 2

## 2023-05-24 MED ORDER — LORAZEPAM 1 MG PO TABS: 1.0000 mg | ORAL_TABLET | Freq: Two times a day (BID) | ORAL | Status: DC

## 2023-05-24 MED ORDER — SODIUM CHLORIDE 0.9 % IV SOLN
500.0000 mg | INTRAVENOUS | Status: AC
  Administered 2023-05-24 – 2023-05-28 (×5): 500 mg via INTRAVENOUS
  Filled 2023-05-24 (×5): qty 5

## 2023-05-24 MED ORDER — SODIUM CHLORIDE 0.9 % IV SOLN
2.0000 g | INTRAVENOUS | Status: AC
  Administered 2023-05-24 – 2023-05-28 (×5): 2 g via INTRAVENOUS
  Filled 2023-05-24 (×5): qty 20

## 2023-05-24 MED ORDER — QUETIAPINE FUMARATE 25 MG PO TABS
50.0000 mg | ORAL_TABLET | Freq: Every day | ORAL | Status: DC
  Filled 2023-05-24: qty 2

## 2023-05-24 MED ORDER — BUDESONIDE 0.25 MG/2ML IN SUSP
0.2500 mg | Freq: Two times a day (BID) | RESPIRATORY_TRACT | Status: DC
  Administered 2023-05-24 – 2023-05-29 (×8): 0.25 mg via RESPIRATORY_TRACT
  Filled 2023-05-24 (×9): qty 2

## 2023-05-24 MED ORDER — ONDANSETRON HCL 4 MG PO TABS: 4.0000 mg | ORAL_TABLET | Freq: Four times a day (QID) | ORAL | Status: DC | PRN

## 2023-05-24 MED ORDER — IPRATROPIUM-ALBUTEROL 0.5-2.5 (3) MG/3ML IN SOLN
3.0000 mL | Freq: Four times a day (QID) | RESPIRATORY_TRACT | Status: DC
  Administered 2023-05-24 – 2023-05-25 (×6): 3 mL via RESPIRATORY_TRACT
  Filled 2023-05-24 (×6): qty 3

## 2023-05-24 MED ORDER — METHYLPREDNISOLONE SODIUM SUCC 40 MG IJ SOLR
20.0000 mg | Freq: Two times a day (BID) | INTRAMUSCULAR | Status: DC
  Administered 2023-05-24 – 2023-05-25 (×2): 20 mg via INTRAVENOUS
  Filled 2023-05-24 (×2): qty 1

## 2023-05-24 MED ORDER — IPRATROPIUM-ALBUTEROL 0.5-2.5 (3) MG/3ML IN SOLN
3.0000 mL | Freq: Once | RESPIRATORY_TRACT | Status: AC
  Administered 2023-05-24: 3 mL via RESPIRATORY_TRACT
  Filled 2023-05-24: qty 3

## 2023-05-24 MED ORDER — QUETIAPINE FUMARATE 100 MG PO TABS: 100.0000 mg | ORAL_TABLET | Freq: Every day | ORAL | Status: DC

## 2023-05-24 MED ORDER — ARFORMOTEROL TARTRATE 15 MCG/2ML IN NEBU
15.0000 ug | INHALATION_SOLUTION | Freq: Two times a day (BID) | RESPIRATORY_TRACT | Status: DC
Start: 2023-05-24 — End: 2023-05-29
  Administered 2023-05-24 – 2023-05-29 (×8): 15 ug via RESPIRATORY_TRACT
  Filled 2023-05-24 (×9): qty 2

## 2023-05-24 MED ORDER — DONEPEZIL HCL 10 MG PO TABS
10.0000 mg | ORAL_TABLET | Freq: Every day | ORAL | Status: DC
  Administered 2023-05-25 – 2023-05-28 (×4): 10 mg via ORAL
  Filled 2023-05-24 (×5): qty 1

## 2023-05-24 MED ORDER — ATORVASTATIN CALCIUM 10 MG PO TABS
20.0000 mg | ORAL_TABLET | Freq: Every evening | ORAL | Status: DC
  Administered 2023-05-25 – 2023-05-28 (×4): 20 mg via ORAL
  Filled 2023-05-24 (×5): qty 2

## 2023-05-24 MED ORDER — FUROSEMIDE 10 MG/ML IJ SOLN
60.0000 mg | Freq: Once | INTRAMUSCULAR | Status: AC
  Administered 2023-05-24: 60 mg via INTRAVENOUS
  Filled 2023-05-24: qty 8

## 2023-05-24 MED ORDER — ACETAMINOPHEN 325 MG PO TABS: 650.0000 mg | ORAL_TABLET | Freq: Four times a day (QID) | ORAL | Status: DC | PRN

## 2023-05-24 MED ORDER — ONDANSETRON HCL 4 MG/2ML IJ SOLN: 4.0000 mg | Freq: Four times a day (QID) | INTRAMUSCULAR | Status: DC | PRN

## 2023-05-24 MED ORDER — ACETAMINOPHEN 650 MG RE SUPP: 650.0000 mg | Freq: Four times a day (QID) | RECTAL | Status: DC | PRN

## 2023-05-24 MED ORDER — CLOPIDOGREL BISULFATE 75 MG PO TABS
75.0000 mg | ORAL_TABLET | Freq: Every day | ORAL | Status: DC
Start: 2023-05-25 — End: 2023-05-29
  Administered 2023-05-25 – 2023-05-29 (×5): 75 mg via ORAL
  Filled 2023-05-24 (×5): qty 1

## 2023-05-24 MED ORDER — AMLODIPINE BESYLATE 5 MG PO TABS
5.0000 mg | ORAL_TABLET | Freq: Every day | ORAL | Status: DC
  Administered 2023-05-25 – 2023-05-29 (×5): 5 mg via ORAL
  Filled 2023-05-24 (×5): qty 1

## 2023-05-24 NOTE — H&P (Signed)
History and Physical    Patient: Cindy Padilla:[id number] DOB: 1940/06/10 DOA: 05/24/2023 DOS: the patient was seen and examined on 05/24/2023 PCP: Valere Dross, FNP  Patient coming from: SNF Medical readiness/disposition: Anticipate patient should be ready for discharge back to skilled nursing facility on 05/28/2023  Chief Complaint:  Chief Complaint  Patient presents with   Cough   Shortness of Breath    Cellulitis    HPI: Cindy Padilla is a 82 y.o. female with medical history significant of COPD not on oxygen chronically, dementia, anemia, bipolar 1 disorder,HFpEF, moderate aortic stenosis and hypertension.  Upon review of preadmission medications patient apparently began having respiratory symptoms on 12/26 and was started on Levaquin.  EMS was called to the nursing facility because staff noticed increased shortness of breath and decreased mental status.  She had audible wheezing on exam and was given 2 DuoNebs and 125 mg of Solu-Medrol prior to arrival to the ER.  Patient was awake and able to answer orientation questions upon arrival to the ED.  She reported to the EDP shortness of breath and frequent coughing.  She was unable to clarify duration of onset of symptoms.  Upon arrival she was not hypoxemic but sats were in the 94-96 range and she did have increased work of breathing/tachypnea.  Chest x-ray done in the ED revealed diffuse interstitial prominence and peribronchial cuffing concerning for acute bronchitis.  Venous ABG did reveal low oxygen level of 56.  When awake patient able to maintain O2 saturations greater than 92% but when sleeping O2 sats dropped into the 89 to 90% range therefore I have applied nasal cannula O2 with caveat to keep in place and maintain O2 sats greater than 94% especially while sleeping.  PCR was also positive for RSV.  Labs were unremarkable except for borderline low potassium at 3.5, BUN of 25, creatinine of 1.4 with a baseline  creatinine 1.18.  Lactic acid was normal, no leukocytosis, BNP slightly elevated at 129.8 with a elevated troponin of 26.  Hospital service has been consulted to evaluate the patient for admission.  On my evaluation of the patient she was quite lethargic but had improved work of breathing and appeared to be resting comfortably.  She would awaken briefly to attempt to answer questions but went back to sleep rather quickly as well.  She was maintaining sinus rhythm on telemetry.  As noted above her O2 sats when sleeping were less than 92% but after oxygen 2 L applied her O2 sats were greater than 94%.  She was also noted to have what appeared to be chronic appearing edema of the lower extremities right greater than left and skin changes consistent with stasis dermatitis.  Of note patient was evaluated in the ED on 12/26 for an unwitnessed fall but had no injuries or other need to remain in the hospital so was discharged back to skilled nursing facility.  Review of Systems: As mentioned in the history of present illness. All other systems reviewed and are negative.  Past Medical History:  Diagnosis Date   Arthritis    Bipolar 1 disorder (HCC)    Bronchitis    Chronic back pain    Constipation    COPD (chronic obstructive pulmonary disease) (HCC)    Dementia (HCC)    Diabetes mellitus    GERD (gastroesophageal reflux disease)    Hiatal hernia    Hypertension    IBS (irritable bowel syndrome)    Past Surgical History:  Procedure Laterality  Date   ESOPHAGOGASTRODUODENOSCOPY (EGD) WITH PROPOFOL N/A 04/14/2020   Procedure: ESOPHAGOGASTRODUODENOSCOPY (EGD) WITH PROPOFOL;  Surgeon: Napoleon Form, MD;  Location: [facility];  Service: Endoscopy;  Laterality: N/A;   gallbladder     MANDIBLE SURGERY     PARTIAL HYSTERECTOMY     Social History:  reports that she has never smoked. She quit smokeless tobacco use about 64 years ago.  Her smokeless tobacco use included snuff. She reports that she  does not drink alcohol and does not use drugs.  Allergies  Allergen Reactions   Sulfa Antibiotics Other (See Comments)    Unknown reaction  Listed as an allergy on MAR  Other Reaction(s): Other (See Comments), Unknown    Unknown reaction Listed as an allergy on MAR   Iodine Other (See Comments)    Unknown reaction Listed as an allergy on MAR   Ms Contin [Morphine] Other (See Comments)    Unknown reaction Listed as an allergy on MAR   Aminoglycosides Other (See Comments)    Unknown reaction  Listed as an allergy on MAR   Aspirin Other (See Comments)    Unknown reaction  Listed as an allergy on MAR   Codeine Other (See Comments)    Unknown reaction Listed as an allergy on MAR   Ivp Dye [Iodinated Contrast Media] Other (See Comments)    Unknown reaction Listed as an allergy on MAR   Neomycin Other (See Comments)    Unknown reaction Listed as an allergy on MAR   Penicillins Rash and Other (See Comments)    Not listed on MAR  Has patient had a PCN reaction causing immediate rash, facial/tongue/throat swelling, SOB or lightheadedness with hypotension: No Has patient had a PCN reaction causing severe rash involving mucus membranes or skin necrosis: No Has patient had a PCN reaction that required hospitalization No Has patient had a PCN reaction occurring within the last 10 years: No If all of the above answers are "NO", then may proceed with Cephalosporin use.   Phenothiazines Other (See Comments)    Unknown reaction Listed as an allergy on MAR   Promethazine Other (See Comments)    Unknown reaction Not listed on MAR   Tetracyclines & Related Other (See Comments)    Unknown reaction Listed as an allergy on MAR    Family History  Problem Relation Age of Onset   Emphysema Mother    Cancer Other     Prior to Admission medications   Medication Sig Start Date End Date Taking? Authorizing Provider  acetaminophen (TYLENOL) 325 MG tablet Take 650 mg by mouth 3 (three)  times daily.    [provider]  albuterol (VENTOLIN HFA) 108 (90 Base) MCG/ACT inhaler Inhale 2 puffs into the lungs every 4 (four) hours as needed for wheezing or shortness of breath. 05/12/23  Yes [provider]  amLODipine (NORVASC) 5 MG tablet Take 5 mg by mouth daily.    [provider]  antiseptic oral rinse (BIOTENE) LIQD 10 mLs by Mouth Rinse route at bedtime.    [provider]  atorvastatin (LIPITOR) 20 MG tablet Take 20 mg by mouth every evening.    [provider]  baclofen (LIORESAL) 10 MG tablet Take 10 mg by mouth 2 (two) times daily.    [provider]  BREO ELLIPTA 200-25 MCG/ACT AEPB Inhale 1 puff into the lungs daily. 04/21/23   [provider]  Cholecalciferol (VITAMIN D) 50 MCG (2000 UT) tablet Take 2,000 Units by  mouth daily.     [provider]  clopidogrel (PLAVIX) 75 MG tablet Take 1 tablet (75 mg total) by mouth daily. 04/20/20   Almon Hercules, MD  diclofenac Sodium (VOLTAREN) 1 % GEL Apply 2 g topically 4 (four) times daily as needed (Pain).    [provider]  donepezil (ARICEPT) 10 MG tablet Take 10 mg by mouth at bedtime.    [provider]  ferrous sulfate 325 (65 FE) MG tablet Take 1 tablet (325 mg total) by mouth 2 (two) times daily with a meal. Patient taking differently: Take 325 mg by mouth daily. 04/17/20   Almon Hercules, MD  fluticasone (FLONASE) 50 MCG/ACT nasal spray Place 2 sprays into both nostrils daily.     [provider]  guaifenesin (ROBITUSSIN) 100 MG/5ML syrup Take 200 mg by mouth 4 (four) times daily as needed for cough.    [provider]  hydrocortisone 2.5 % cream Apply 1 Application topically daily as needed (Irritation). 05/12/23   [provider]  INCRUSE ELLIPTA 62.5 MCG/INH AEPB Inhale 1 puff into the lungs daily.  04/17/19   [provider]  ipratropium-albuterol (DUONEB) 0.5-2.5 (3) MG/3ML SOLN Take 3 mLs by  nebulization every 6 (six) hours as needed. Patient taking differently: Take 3 mLs by nebulization every 4 (four) hours as needed (Wheezing/SOB). 04/21/18   Barnetta Chapel, MD  lamoTRIgine (LAMICTAL) 200 MG tablet Take 200 mg by mouth 2 (two) times daily.    [provider]  levofloxacin (LEVAQUIN) 500 MG tablet Take 1 tablet (500 mg total) by mouth daily. 05/22/23   Marita Kansas, PA-C  loperamide (IMODIUM A-D) 2 MG tablet Take 2 mg by mouth every 4 (four) hours as needed for diarrhea or loose stools.   Yes [provider]  LORazepam (ATIVAN) 1 MG tablet Take 1 tablet (1 mg total) by mouth 2 (two) times daily. 10/16/19   Elgergawy, Leana Roe, MD  magnesium oxide (MAG-OX) 400 (241.3 Mg) MG tablet Take 400 mg by mouth daily.    [provider]  montelukast (SINGULAIR) 10 MG tablet Take 10 mg by mouth at bedtime.    [provider]  [facility] powder Apply 1 Application topically 2 (two) times daily as needed (Rash). 05/12/23   [provider]  polyethylene glycol (MIRALAX / GLYCOLAX) 17 g packet Take 17 g by mouth 2 (two) times daily as needed for moderate constipation.    [provider]  potassium chloride (KLOR-CON) 20 MEQ packet Take 20 mEq by mouth daily.    [provider]  potassium chloride SA (KLOR-CON M) 20 MEQ tablet Take 20 mEq by mouth daily. 04/21/23   [provider]  QUEtiapine (SEROQUEL) 100 MG tablet Take 100-150 mg by mouth See admin instructions. Take 100mg  (1 tablet) by mouth every morning, and 150mg  (1 and 1/2 tablet) every evening.    [provider]  senna-docusate (SENOKOT-S) 8.6-50 MG per tablet Take 1 tablet by mouth 2 (two) times daily.    [provider]  torsemide (DEMADEX) 20 MG tablet Take 3 tablets (60 mg total) by mouth daily. Patient taking differently: Take 80 mg by mouth daily. 10/16/19   Elgergawy, Leana Roe, MD    Physical Exam: Vitals:   05/24/23 1400 05/24/23 1411 05/24/23  1423 05/24/23 1430  BP: (!) 134/54  (!) 124/49 (!) 122/58  Pulse: 82 83 78 79  Resp: 17 17 16 16   Temp:   98.4 F (36.9 C)  TempSrc:   Oral   SpO2: 90% 92% 97% 95%  Weight:      Height:       Constitutional: NAD, calm but somewhat lethargic, appears to be comfortable Respiratory: Bilateral lung sounds with coarse diffuse inspiratory and expiratory rhonchi throughout all fields.  Oxygen applied as noted.  Normal respiratory rate and at this time no increased work of breathing while still Cardiovascular: Regular rate and rhythm, very tight pansystolic murmur left sternal border second intercostal space; no rubs / gallops.  Bilateral extremity edema right greater than left. 2+ pedal pulses.  Abdomen: no tenderness, no masses palpated. No hepatosplenomegaly. Bowel sounds positive.  Musculoskeletal: no clubbing / cyanosis. No joint deformity upper and lower extremities. Good ROM, no contractures. Normal muscle tone.  Skin: no rashes, lesions, ulcers. No induration Neurologic: CN 2-12 grossly intact based on observation noting patient having difficulty staying awake to participate with exam. Sensation peers to be grossly intact,Strength 2-3/5 x all 4 extremities.  Psychiatric: Drowsy and oriented x name only.  .   Data Reviewed:  As per HPI  Assessment and Plan: Acute hypoxemic respiratory failure secondary to RSV bronchitis with likely superimposed bacterial pneumonia Was on Levaquin for 2 days prior to presentation Positive for RSV and chest x-ray has bronchitic changes but as a precaution we will give Rocephin and Zithromax to cover any secondary bacterial pneumonia Digital DuoNebs every 6 hours and Solu-Medrol 20 mg IV every 12 hours Continuous pulse oximetry Check procalcitonin, CRP and urinary strep Continue supportive care with oxygen and continuous pulse oximetry Patient is a DNR and if respiratory status worsens would not escalate care  Acute metabolic encephalopathy Likely  multifactorial secondary to recent issue with increased work of breathing and likely inability to rest/sleep Check CT head stat Given her underlying history of COPD and severe bronchitis there could be a degree of CO2 retention therefore we will only utilize oxygen at 2 L/min Has underlying dementia as well Could also have a urinary tract infection therefore we will check urinalysis and culture via cath specimen N.p.o.  Known chronic HFpEF Mild elevation in BNP as well as troponin that could be solely from hypoxemia but could also be a marker of heart failure exacerbation secondary to hypoxemia Second troponin pending On Demadex 80 mg daily at the facility.  Will give Lasix 60 mg IV x 1 to see if this helps improve respiratory status.  Will check electrolytes in the a.m. to ensure this has not worsened renal function  Mild renal insufficiency Current creatinine elevated at 1.40 with baseline around 1.18 **repeat creatinine after arrival and after initiation of oxygen down to 1.21 Repeat labs in a.m.  Known aortic stenosis Last echocardiogram 2021 with moderate stenosis On auscultation patient had a very tight stenotic murmur and I am concerned her stenosis has worsened.  Although she is clearly not a candidate for any type of repair the degree of stenosis could influence future care especially if patient status worsens Obtain echocardiogram this admission  Hypertension Continue Norvasc  HLD Continue statin   Advance Care Planning:   Code Status: Limited: Do not attempt resuscitation (DNR) -DNR-LIMITED -Do Not Intubate/DNI    VTE prophylaxis: Lovenox  Consults: None  Family Communication: Called daughter Leonides Sake but went to voicemail.  Daughter briefly updated that mother was in the emergency room with pneumonia and was requiring oxygen and to please call us for any questions.  Was made aware mother would be admitted to the hospital.  Severity  of Illness: The appropriate  patient status for this patient is INPATIENT. Inpatient status is judged to be reasonable and necessary in order to provide the required intensity of service to ensure the patient's safety. The patient's presenting symptoms, physical exam findings, and initial radiographic and laboratory data in the context of their chronic comorbidities is felt to place them at high risk for further clinical deterioration. Furthermore, it is not anticipated that the patient will be medically stable for discharge from the hospital within 2 midnights of admission.   * I certify that at the point of admission it is my clinical judgment that the patient will require inpatient hospital care spanning beyond 2 midnights from the point of admission due to high intensity of service, high risk for further deterioration and high frequency of surveillance required.*  Author: Junious Silk, NP 05/24/2023 2:55 PM  For on call review [email or web address].

## 2023-05-24 NOTE — ED Provider Notes (Signed)
Emergency Department Provider Note   I have reviewed the triage vital signs and the nursing notes.   HISTORY  Chief Complaint Cough and Shortness of Breath (Cellulitis )   HPI Cindy Padilla is a 82 y.o. female with past history reviewed below including dementia, COPD, hypertension presents to the emergency department with increased shortness of breath, wheezing, confusion.  She is arriving from her nursing facility by EMS.  They were called with increased shortness of breath and decreased mental status.  She had wheezing on exam and was given 2 DuoNebs and 125 mg of Solu-Medrol prior to arrival.  Patient tells me she is feeling short of breath and having frequent cough.  She is unsure how Cathie Bonnell it has been ongoing but EMS reports several days.  No severe hypoxemia. Level 5 caveat: Dementia.     Past Medical History:  Diagnosis Date   Arthritis    Bipolar 1 disorder (HCC)    Bronchitis    Chronic back pain    Constipation    COPD (chronic obstructive pulmonary disease) (HCC)    Dementia (HCC)    Diabetes mellitus    GERD (gastroesophageal reflux disease)    Hiatal hernia    Hypertension    IBS (irritable bowel syndrome)     Review of Systems  Constitutional: No fever/chills Cardiovascular: Denies chest pain. Respiratory: Positive shortness of breath. Gastrointestinal: No abdominal pain.  Musculoskeletal: Negative for back pain. Skin: Negative for rash. Neurological: Mild HA.  ____________________________________________   PHYSICAL EXAM:  VITAL SIGNS: ED Triage Vitals  Encounter Vitals Group     BP 05/24/23 1107 (!) 124/99     Pulse Rate 05/24/23 1107 85     Resp 05/24/23 1107 19     Temp 05/24/23 1107 98.2 F (36.8 C)     Temp Source 05/24/23 1107 Oral     SpO2 05/24/23 1107 98 %     Weight 05/24/23 1116 161 lb 6 oz (73.2 kg)     Height 05/24/23 1116 5' (1.524 m)   Constitutional: Alert and oriented. Awake but increased work of breathing noted.   Eyes: Conjunctivae are normal.  Head: Atraumatic. Nose: No congestion/rhinnorhea. Mouth/Throat: Mucous membranes are moist.   Neck: No stridor.   Cardiovascular: Normal rate, regular rhythm. Good peripheral circulation. Grossly normal heart sounds.   Respiratory: Increased respiratory effort.  No retractions. Lungs with coarse wheezing bilaterally. Gastrointestinal: Soft and nontender. No distention.  Musculoskeletal: No gross deformities of extremities. Neurologic:  Normal speech and language.  Skin:  Skin is warm, dry and intact. No rash noted.   ____________________________________________   LABS (all labs ordered are listed, but only abnormal results are displayed)  Labs Reviewed  RESP PANEL BY RT-PCR (RSV, FLU A&B, COVID)  RVPGX2 - Abnormal; Notable for the following components:      Result Value   Resp Syncytial Virus by PCR POSITIVE (*)    All other components within normal limits  COMPREHENSIVE METABOLIC PANEL - Abnormal; Notable for the following components:   Chloride 97 (*)    Glucose, Bld 128 (*)    BUN 25 (*)    Creatinine, Ser 1.40 (*)    GFR, Estimated 37 (*)    All other components within normal limits  CBC WITH DIFFERENTIAL/PLATELET - Abnormal; Notable for the following components:   Hemoglobin 11.5 (*)    HCT 35.6 (*)    All other components within normal limits  BRAIN NATRIURETIC PEPTIDE - Abnormal; Notable for the following components:  B Natriuretic Peptide 129.8 (*)    All other components within normal limits  BLOOD GAS, VENOUS - Abnormal; Notable for the following components:   pO2, Ven 56 (*)    All other components within normal limits  TROPONIN I (HIGH SENSITIVITY) - Abnormal; Notable for the following components:   Troponin I (High Sensitivity) 26 (*)    All other components within normal limits  URINALYSIS, W/ REFLEX TO CULTURE (INFECTION SUSPECTED)  I-STAT CG4 LACTIC ACID, ED  I-STAT CG4 LACTIC ACID, ED  TROPONIN I (HIGH SENSITIVITY)    ____________________________________________  EKG   EKG Interpretation Date/Time:    Ventricular Rate:    PR Interval:    QRS Duration:    QT Interval:    QTC Calculation:   R Axis:      Text Interpretation:          ____________________________________________  RADIOLOGY  DG Chest 2 View Result Date: 05/24/2023 CLINICAL DATA:  82 year old female with history of shortness of breath. EXAM: CHEST - 2 VIEW COMPARISON:  Chest x-ray 05/22/2023. FINDINGS: Lung volumes are low. Elevation of the right hemidiaphragm again noted. No confluent consolidative airspace disease. No pleural effusions. Diffuse interstitial prominence and widespread peribronchial cuffing. Heart size is mildly enlarged (unchanged). Upper mediastinal contours are within normal limits. Atherosclerotic calcifications are noted in the thoracic aorta. Electronic device projecting over the left hemithorax with lead extending into the left cervical region, presumably a vagal nerve stimulator. IMPRESSION: 1. Diffuse interstitial prominence and peribronchial cuffing concerning for an acute bronchitis. 2. Aortic atherosclerosis. 3. Mild cardiomegaly. 4. Low lung volumes. Electronically Signed   By: Trudie Reed M.D.   On: 05/24/2023 12:31    ____________________________________________   PROCEDURES  Procedure(s) performed:   Procedures   ____________________________________________   INITIAL IMPRESSION / ASSESSMENT AND PLAN / ED COURSE  Pertinent labs & imaging results that were available during my care of the patient were reviewed by me and considered in my medical decision making (see chart for details).   This patient is Presenting for Evaluation of ***, which {Range:23949} require a range of treatment options, and {MDMcomplaint:23950} a complaint that involves a {MDMlevelrisk:23951} risk of morbidity and mortality.  The Differential Diagnoses include***.  Critical Interventions-    Medications   ipratropium-albuterol (DUONEB) 0.5-2.5 (3) MG/3ML nebulizer solution 3 mL (3 mLs Nebulization Given 05/24/23 1222)    Reassessment after intervention:     I *** Additional Historical Information from ***, as the patient is ***.  I decided to review pertinent External Data, and in summary ***.   Clinical Laboratory Tests Ordered, included   Radiologic Tests Ordered, included ***. I independently interpreted the images and agree with radiology interpretation.   Cardiac Monitor Tracing which shows ***   Social Determinants of Health Risk ***  Consult complete with  Medical Decision Making: Summary: ***  Reevaluation with update and discussion with   ***Considered admission***  Patient's presentation is most consistent with {EM COPA:27473}   Disposition:   ____________________________________________  FINAL CLINICAL IMPRESSION(S) / ED DIAGNOSES  Final diagnoses:  COPD exacerbation (HCC)  RSV (respiratory syncytial virus infection)     NEW OUTPATIENT MEDICATIONS STARTED DURING THIS VISIT:  New Prescriptions   No medications on file    Note:  This document was prepared using Dragon voice recognition software and may include unintentional dictation errors.  Alona Bene, MD, [facility]

## 2023-05-24 NOTE — Progress Notes (Signed)
Notified MD pt is now alert and oriented x 4. Diet order?

## 2023-05-24 NOTE — ED Triage Notes (Signed)
Pt. BIB EMS. Pt. From nursing facility. Per EMS pt. Had audible wheezing and rhonci. Pt. Has noted cough. Pt. Has dementia at baseline but mentation has been altered. Duo nebs x 2 given prior to arrrival. 125mg  solumedrol given in route. Redness noted to BLE.  142/64 BP 24 RR 129 CBG 84 HR 98% per duo neb

## 2023-05-24 NOTE — Progress Notes (Signed)
Pt arrived from ED via stretcher, positive for RSV, pt not wearing a mask.  Pt is unable to be aroused at this time so I am unable to complete admission.

## 2023-05-24 NOTE — ED Notes (Signed)
Pt to xray

## 2023-05-25 ENCOUNTER — Inpatient Hospital Stay (HOSPITAL_COMMUNITY): Payer: Medicare Other

## 2023-05-25 DIAGNOSIS — I350 Nonrheumatic aortic (valve) stenosis: Secondary | ICD-10-CM | POA: Diagnosis not present

## 2023-05-25 DIAGNOSIS — J205 Acute bronchitis due to respiratory syncytial virus: Secondary | ICD-10-CM | POA: Diagnosis not present

## 2023-05-25 LAB — CBC
HCT: 47 % — ABNORMAL HIGH (ref 36.0–46.0)
HCT: 35.8 % — ABNORMAL LOW (ref 36.0–46.0)
Hemoglobin: 16 g/dL — ABNORMAL HIGH (ref 12.0–15.0)
Hemoglobin: 11.4 g/dL — ABNORMAL LOW (ref 12.0–15.0)
MCH: 30.6 pg (ref 26.0–34.0)
MCH: 29.6 pg (ref 26.0–34.0)
MCHC: 34 g/dL (ref 30.0–36.0)
MCHC: 31.8 g/dL (ref 30.0–36.0)
MCV: 89.9 fL (ref 80.0–100.0)
MCV: 93 fL (ref 80.0–100.0)
Platelets: 102 10*3/uL — ABNORMAL LOW (ref 150–400)
Platelets: 235 10*3/uL (ref 150–400)
RBC: 5.23 MIL/uL — ABNORMAL HIGH (ref 3.87–5.11)
RBC: 3.85 MIL/uL — ABNORMAL LOW (ref 3.87–5.11)
RDW: 14.8 % (ref 11.5–15.5)
RDW: 14.9 % (ref 11.5–15.5)
WBC: 5.9 10*3/uL (ref 4.0–10.5)
WBC: 10.5 10*3/uL (ref 4.0–10.5)
nRBC: 0 % (ref 0.0–0.2)
nRBC: 0 % (ref 0.0–0.2)

## 2023-05-25 LAB — ECHOCARDIOGRAM COMPLETE
AR max vel: 0.85 cm2
AV Area VTI: 0.74 cm2
AV Area mean vel: 0.79 cm2
AV Mean grad: 47 mm[Hg]
AV Peak grad: 60.6 mm[Hg]
Ao pk vel: 3.89 m/s
Area-P 1/2: 4.34 cm2
Calc EF: 61.3 %
Height: 60 in
S' Lateral: 2.2 cm
Single Plane A2C EF: 58.4 %
Single Plane A4C EF: 70.1 %
Weight: 2529.12 [oz_av]

## 2023-05-25 LAB — BASIC METABOLIC PANEL
Anion gap: 11 (ref 5–15)
BUN: 26 mg/dL — ABNORMAL HIGH (ref 8–23)
CO2: 27 mmol/L (ref 22–32)
Calcium: 9.1 mg/dL (ref 8.9–10.3)
Chloride: 100 mmol/L (ref 98–111)
Creatinine, Ser: 0.89 mg/dL (ref 0.44–1.00)
GFR, Estimated: >60 mL/min (ref >60)
Glucose, Bld: 141 mg/dL — ABNORMAL HIGH (ref 70–99)
Potassium: 3.5 mmol/L (ref 3.5–5.1)
Sodium: 138 mmol/L (ref 135–145)

## 2023-05-25 LAB — STREP PNEUMONIAE URINARY ANTIGEN: Strep Pneumo Urinary Antigen: NEGATIVE

## 2023-05-25 MED ORDER — LORAZEPAM 1 MG PO TABS
1.0000 mg | ORAL_TABLET | Freq: Two times a day (BID) | ORAL | Status: DC
  Administered 2023-05-25 – 2023-05-29 (×8): 1 mg via ORAL
  Filled 2023-05-25 (×8): qty 1

## 2023-05-25 MED ORDER — METHYLPREDNISOLONE SODIUM SUCC 40 MG IJ SOLR
40.0000 mg | Freq: Two times a day (BID) | INTRAMUSCULAR | Status: DC
  Administered 2023-05-25 – 2023-05-27 (×5): 40 mg via INTRAVENOUS
  Filled 2023-05-25 (×5): qty 1

## 2023-05-25 MED ORDER — QUETIAPINE FUMARATE 25 MG PO TABS
50.0000 mg | ORAL_TABLET | Freq: Every day | ORAL | Status: DC
  Administered 2023-05-25: 50 mg via ORAL
  Filled 2023-05-25: qty 2

## 2023-05-25 MED ORDER — QUETIAPINE FUMARATE 100 MG PO TABS
100.0000 mg | ORAL_TABLET | Freq: Every day | ORAL | Status: DC
  Administered 2023-05-26 – 2023-05-29 (×4): 100 mg via ORAL
  Filled 2023-05-25 (×4): qty 1

## 2023-05-25 MED ORDER — MONTELUKAST SODIUM 10 MG PO TABS
10.0000 mg | ORAL_TABLET | Freq: Every day | ORAL | Status: DC
  Administered 2023-05-25 – 2023-05-28 (×4): 10 mg via ORAL
  Filled 2023-05-25 (×4): qty 1

## 2023-05-25 MED ORDER — GUAIFENESIN ER 600 MG PO TB12
1200.0000 mg | ORAL_TABLET | Freq: Two times a day (BID) | ORAL | Status: DC
  Administered 2023-05-25 – 2023-05-29 (×9): 1200 mg via ORAL
  Filled 2023-05-25 (×9): qty 2

## 2023-05-25 MED ORDER — CYANOCOBALAMIN 1000 MCG/ML IJ SOLN
1000.0000 ug | Freq: Every day | INTRAMUSCULAR | Status: AC
  Administered 2023-05-25 – 2023-05-29 (×5): 1000 ug via INTRAMUSCULAR
  Filled 2023-05-25 (×5): qty 1

## 2023-05-25 MED ORDER — POTASSIUM CHLORIDE CRYS ER 20 MEQ PO TBCR
40.0000 meq | EXTENDED_RELEASE_TABLET | Freq: Once | ORAL | Status: AC
  Administered 2023-05-25: 40 meq via ORAL
  Filled 2023-05-25: qty 2

## 2023-05-25 MED ORDER — TORSEMIDE 20 MG PO TABS
60.0000 mg | ORAL_TABLET | Freq: Every day | ORAL | Status: DC
  Administered 2023-05-25 – 2023-05-29 (×5): 60 mg via ORAL
  Filled 2023-05-25 (×6): qty 3

## 2023-05-25 MED ORDER — QUETIAPINE FUMARATE 25 MG PO TABS
150.0000 mg | ORAL_TABLET | Freq: Every day | ORAL | Status: DC
  Administered 2023-05-25 – 2023-05-28 (×4): 150 mg via ORAL
  Filled 2023-05-25 (×4): qty 2

## 2023-05-25 MED ORDER — BENZONATATE 100 MG PO CAPS
100.0000 mg | ORAL_CAPSULE | Freq: Three times a day (TID) | ORAL | Status: DC | PRN
  Administered 2023-05-25 (×2): 100 mg via ORAL
  Filled 2023-05-25 (×2): qty 1

## 2023-05-25 MED ORDER — QUETIAPINE FUMARATE 100 MG PO TABS: 100.0000 mg | ORAL_TABLET | Freq: Every day | ORAL | Status: DC

## 2023-05-25 MED ORDER — ALBUTEROL SULFATE (2.5 MG/3ML) 0.083% IN NEBU: 2.5000 mg | INHALATION_SOLUTION | RESPIRATORY_TRACT | Status: DC | PRN

## 2023-05-25 MED ORDER — MAGNESIUM SULFATE 2 GM/50ML IV SOLN
2.0000 g | Freq: Once | INTRAVENOUS | Status: AC
  Administered 2023-05-25: 2 g via INTRAVENOUS
  Filled 2023-05-25: qty 50

## 2023-05-25 MED ORDER — ALBUTEROL SULFATE (2.5 MG/3ML) 0.083% IN NEBU
INHALATION_SOLUTION | RESPIRATORY_TRACT | Status: AC
  Administered 2023-05-25: 2.5 mg
  Filled 2023-05-25: qty 3

## 2023-05-25 NOTE — Plan of Care (Signed)
°  Problem: Education: Goal: Knowledge of General Education information will improve Description: Including pain rating scale, medication(s)/side effects and non-pharmacologic comfort measures Outcome: Progressing   Problem: Clinical Measurements: Goal: Ability to maintain clinical measurements within normal limits will improve Outcome: Progressing Goal: Respiratory complications will improve Outcome: Progressing   Problem: Elimination: Goal: Will not experience complications related to urinary retention Outcome: Progressing   Problem: Pain Management: Goal: General experience of comfort will improve Outcome: Progressing   Problem: Safety: Goal: Ability to remain free from injury will improve Outcome: Progressing   Problem: Skin Integrity: Goal: Risk for impaired skin integrity will decrease Outcome: Progressing

## 2023-05-25 NOTE — Progress Notes (Addendum)
PROGRESS NOTE    Cindy Padilla  ZOX:[id number] DOB: Mar 24, 1941 DOA: 05/24/2023 PCP: Valere Dross, FNP   Brief Narrative: 82 year old with past medical history significant for COPD not on oxygen, dementia, anemia, bipolar 1 disorder, heart failure preserved ejection fraction moderate aortic stenosis and hypertension presents with altered mental status, hypoxic respiratory failure found to be positive for RSV.   Assessment & Plan:   Principal Problem:   RSV bronchitis  1-Acute Hypoxic Respiratory Failure in the setting of RSV bronchitis with superimposed Bacterial Pneumonia:, Acute COPD -Patient presented with worsening shortness of breath, hypoxia, chest x-ray diffuse interstitial prominence and peribronchial cuffing concerning for acute bronchitis -Patient was found to be RSV positive -Continue with supportive care. -She is having more trouble breathing this morning, she will get extra nebulizer treatment -Continue with DuoNeb, Pulmicort, Brovana -Start guaifenesin, Tessalon,  IV magnesium -Continue 3 L oxygen supplementation -Continue with IV ceftriaxone and azithromycin  Acute metabolic encephalopathy -In the setting of viral illness.  -Plan to resume lower home dose Seroquel she is alert and conversant this morning. -Resume home dose Ativan twice daily as needed  Chronic heart failure preserved ejection fraction: Aortic stenosis -Resume torsemide -Received IV Lasix yesterday -Follow echo  Mild renal insufficiency: -Present  with a creatinine 1.4, -Improved.  Monitor renal function  Hypertension: -Resume Norvasc, torsemide  Hyperlipidemia: -Continue statins  History of bipolar disorder Resume Seroquel and Ativan  Estimated body mass index is 30.87 kg/m as calculated from the following:   Height as of this encounter: 5' (1.524 m).   Weight as of this encounter: 71.7 kg.   DVT prophylaxis: Lovenox  Code Status: DNR Family Communication: Try to  contact family, no answer.  Disposition Plan:  Status is: Inpatient Remains inpatient appropriate because: management of Resp failure.     Consultants:    Procedures:  ECHO  Antimicrobials:    Subjective: She is alert and conversant this morning, she report shortness of breath, having difficulty breathing, she sounds very congested, wheezing.  Objective: Vitals:   05/25/23 0032 05/25/23 0306 05/25/23 0407 05/25/23 0411  BP: (!) 151/69   (!) 165/95  Pulse: 63   92  Resp: 18   18  Temp: 97.9 F (36.6 C)   97.9 F (36.6 C)  TempSrc:      SpO2: 95% 97%  99%  Weight:   71.7 kg   Height:        Intake/Output Summary (Last 24 hours) at 05/25/2023 0754 Last data filed at 05/25/2023 0407 Gross per 24 hour  Intake 120 ml  Output 450 ml  Net -330 ml   Filed Weights   05/24/23 1116 05/25/23 0407  Weight: 73.2 kg 71.7 kg    Examination:  General exam: Appears calm and comfortable  Respiratory system: Normal respiratory effort, bilateral rhonchorous and wheezing Cardiovascular system: S1 & S2 heard, RRR.  Gastrointestinal system: Abdomen is nondistended, soft and nontender. No organomegaly or masses felt. Normal bowel sounds heard. Central nervous system: Alert and oriented. No focal neurological deficits.    Data Reviewed: I have personally reviewed following labs and imaging studies  CBC: Recent Labs  Lab 05/22/23 1203 05/24/23 1114 05/24/23 1556 05/25/23 0506  WBC 9.2 9.9 6.9 5.9  NEUTROABS 6.3 6.3  --   --   HGB 11.4* 11.5* 10.9* 16.0*  HCT 33.4* 35.6* 33.8* 47.0*  MCV 92.8 92.0 94.2 89.9  PLT 203 212 213 102*   Basic Metabolic Panel: Recent Labs  Lab 05/22/23 1203 05/24/23 1114  05/24/23 1430 05/25/23 0506  NA 131* 135  --  138  K 3.2* 3.5  --  3.5  CL 94* 97*  --  100  CO2 24 25  --  27  GLUCOSE 104* 128*  --  141*  BUN 20 25*  --  26*  CREATININE 1.18* 1.40* 1.21* 0.89  CALCIUM 9.0 9.2  --  9.1   GFR: Estimated Creatinine Clearance:  42.3 mL/min (by C-G formula based on SCr of 0.89 mg/dL). Liver Function Tests: Recent Labs  Lab 05/24/23 1114  AST 28  ALT 19  ALKPHOS 93  BILITOT 0.7  PROT 7.4  ALBUMIN 3.6   No results for input(s): "LIPASE", "AMYLASE" in the last 168 hours. Recent Labs  Lab 05/24/23 1710  AMMONIA 25   Coagulation Profile: No results for input(s): "INR", "PROTIME" in the last 168 hours. Cardiac Enzymes: No results for input(s): "CKTOTAL", "CKMB", "CKMBINDEX", "TROPONINI" in the last 168 hours. BNP (last 3 results) No results for input(s): "PROBNP" in the last 8760 hours. HbA1C: No results for input(s): "HGBA1C" in the last 72 hours. CBG: Recent Labs  Lab 05/24/23 1453  GLUCAP 179*   Lipid Profile: No results for input(s): "CHOL", "HDL", "LDLCALC", "TRIG", "CHOLHDL", "LDLDIRECT" in the last 72 hours. Thyroid Function Tests: No results for input(s): "TSH", "T4TOTAL", "FREET4", "T3FREE", "THYROIDAB" in the last 72 hours. Anemia Panel: Recent Labs    05/24/23 1556  VITAMINB12 203   Sepsis Labs: Recent Labs  Lab 05/24/23 1135 05/24/23 1430  PROCALCITON  --  0.24  LATICACIDVEN 1.8  --     Recent Results (from the past 240 hours)  Resp panel by RT-PCR (RSV, Flu A&B, Covid) Anterior Nasal Swab     Status: Abnormal   Collection Time: 05/24/23 11:36 AM   Specimen: Anterior Nasal Swab  Result Value Ref Range Status   SARS Coronavirus 2 by RT PCR NEGATIVE NEGATIVE Final    Comment: (NOTE) SARS-CoV-2 target nucleic acids are NOT DETECTED.  The SARS-CoV-2 RNA is generally detectable in upper respiratory specimens during the acute phase of infection. The lowest concentration of SARS-CoV-2 viral copies this assay can detect is 138 copies/mL. A negative result does not preclude SARS-Cov-2 infection and should not be used as the sole basis for treatment or other patient management decisions. A negative result may occur with  improper specimen collection/handling, submission of  specimen other than nasopharyngeal swab, presence of viral mutation(s) within the areas targeted by this assay, and inadequate number of viral copies(<138 copies/mL). A negative result must be combined with clinical observations, patient history, and epidemiological information. The expected result is Negative.  Fact Sheet for Patients:  BloggerCourse.com  Fact Sheet for Healthcare Providers:  SeriousBroker.it  This test is no t yet approved or cleared by the [place] FDA and  has been authorized for detection and/or diagnosis of SARS-CoV-2 by FDA under an Emergency Use Authorization (EUA). This EUA will remain  in effect (meaning this test can be used) for the duration of the COVID-19 declaration under Section 564(b)(1) of the Act, 21 U.S.C.section 360bbb-3(b)(1), unless the authorization is terminated  or revoked sooner.       Influenza A by PCR NEGATIVE NEGATIVE Final   Influenza B by PCR NEGATIVE NEGATIVE Final    Comment: (NOTE) The Xpert Xpress SARS-CoV-2/FLU/RSV plus assay is intended as an aid in the diagnosis of influenza from Nasopharyngeal swab specimens and should not be used as a sole basis for treatment. Nasal washings and aspirates are  unacceptable for Xpert Xpress SARS-CoV-2/FLU/RSV testing.  Fact Sheet for Patients: BloggerCourse.com  Fact Sheet for Healthcare Providers: SeriousBroker.it  This test is not yet approved or cleared by the [place] FDA and has been authorized for detection and/or diagnosis of SARS-CoV-2 by FDA under an Emergency Use Authorization (EUA). This EUA will remain in effect (meaning this test can be used) for the duration of the COVID-19 declaration under Section 564(b)(1) of the Act, 21 U.S.C. section 360bbb-3(b)(1), unless the authorization is terminated or revoked.     Resp Syncytial Virus by PCR POSITIVE (A) NEGATIVE  Final    Comment: (NOTE) Fact Sheet for Patients: BloggerCourse.com  Fact Sheet for Healthcare Providers: SeriousBroker.it  This test is not yet approved or cleared by the [place] FDA and has been authorized for detection and/or diagnosis of SARS-CoV-2 by FDA under an Emergency Use Authorization (EUA). This EUA will remain in effect (meaning this test can be used) for the duration of the COVID-19 declaration under Section 564(b)(1) of the Act, 21 U.S.C. section 360bbb-3(b)(1), unless the authorization is terminated or revoked.  Performed at [facility], [address], [place], [place] [address]          Radiology Studies: CT HEAD WO CONTRAST ( ) Result Date: 05/24/2023 CLINICAL DATA:  Altered mentation, dementia EXAM: CT HEAD WITHOUT CONTRAST TECHNIQUE: Contiguous axial images were obtained from the base of the skull through the vertex without intravenous contrast. RADIATION DOSE REDUCTION: This exam was performed according to the departmental dose-optimization program which includes automated exposure control, adjustment of the mA and/or kV according to patient size and/or use of iterative reconstruction technique. COMPARISON:  05/22/2023 CT head FINDINGS: Brain: No evidence of acute infarction, hemorrhage, mass, mass effect, or midline shift. No hydrocephalus or extra-axial fluid collection. Periventricular white matter changes, likely the sequela of chronic small vessel ischemic disease. Cerebral volume is within normal limits for age. Vascular: No hyperdense vessel. Skull: Negative for fracture or focal lesion. Sinuses/Orbits: Mucosal thickening throughout the paranasal sinuses, most prominent in the ethmoid air cells. Status post bilateral lens replacements. Other: The mastoid air cells are well aerated. IMPRESSION: No acute intracranial process. Electronically Signed   By: Wiliam Ke M.D.   On:  05/24/2023 21:40   DG Chest 2 View Result Date: 05/24/2023 CLINICAL DATA:  82 year old female with history of shortness of breath. EXAM: CHEST - 2 VIEW COMPARISON:  Chest x-ray 05/22/2023. FINDINGS: Lung volumes are low. Elevation of the right hemidiaphragm again noted. No confluent consolidative airspace disease. No pleural effusions. Diffuse interstitial prominence and widespread peribronchial cuffing. Heart size is mildly enlarged (unchanged). Upper mediastinal contours are within normal limits. Atherosclerotic calcifications are noted in the thoracic aorta. Electronic device projecting over the left hemithorax with lead extending into the left cervical region, presumably a vagal nerve stimulator. IMPRESSION: 1. Diffuse interstitial prominence and peribronchial cuffing concerning for an acute bronchitis. 2. Aortic atherosclerosis. 3. Mild cardiomegaly. 4. Low lung volumes. Electronically Signed   By: Trudie Reed M.D.   On: 05/24/2023 12:31        Scheduled Meds:  amLODipine  5 mg Oral Daily   arformoterol  15 mcg Nebulization BID   atorvastatin  20 mg Oral QPM   budesonide (PULMICORT) nebulizer solution  0.25 mg Nebulization BID   clopidogrel  75 mg Oral Daily   cyanocobalamin  1,000 mcg Intramuscular Daily   donepezil  10 mg Oral QHS   enoxaparin (LOVENOX) injection  30 mg Subcutaneous QHS  ipratropium-albuterol  3 mL Nebulization Q6H   lamoTRIgine  200 mg Oral BID   methylPREDNISolone (SOLU-MEDROL) injection  20 mg Intravenous Q12H   QUEtiapine  50 mg Oral QHS   sodium chloride flush  3 mL Intravenous Q12H   Continuous Infusions:  azithromycin (ZITHROMAX) 500 mg in sodium chloride 0.9 % 250 mL IVPB 500 mg (05/24/23 1832)   cefTRIAXone (ROCEPHIN)  IV 2 g (05/24/23 1431)     LOS: 1 day    Time spent: 35 minutes.     [name], MD [facility]   If 7PM-7AM, please contact night-coverage [email or web address]  05/25/2023, 7:54 AM

## 2023-05-25 NOTE — Plan of Care (Signed)

## 2023-05-25 NOTE — Progress Notes (Signed)
Purewick changed, bedding changed, patient bathed

## 2023-05-25 NOTE — Progress Notes (Signed)
Notified MD, pt sounds horrible, just had nebs at 8 but is having trouble catching her breath.  Charge nurse also notified

## 2023-05-25 NOTE — Progress Notes (Signed)
°  Echocardiogram 2D Echocardiogram has been performed.  Cindy Padilla 05/25/2023, 11:30 AM

## 2023-05-26 ENCOUNTER — Inpatient Hospital Stay (HOSPITAL_COMMUNITY): Payer: Medicare Other

## 2023-05-26 DIAGNOSIS — J205 Acute bronchitis due to respiratory syncytial virus: Secondary | ICD-10-CM | POA: Diagnosis not present

## 2023-05-26 LAB — BASIC METABOLIC PANEL
Anion gap: 12 (ref 5–15)
BUN: 30 mg/dL — ABNORMAL HIGH (ref 8–23)
CO2: 25 mmol/L (ref 22–32)
Calcium: 9.1 mg/dL (ref 8.9–10.3)
Chloride: 100 mmol/L (ref 98–111)
Creatinine, Ser: 0.83 mg/dL (ref 0.44–1.00)
GFR, Estimated: >60 mL/min (ref >60)
Glucose, Bld: 152 mg/dL — ABNORMAL HIGH (ref 70–99)
Potassium: 3.6 mmol/L (ref 3.5–5.1)
Sodium: 137 mmol/L (ref 135–145)

## 2023-05-26 LAB — URINE CULTURE: Culture: NO GROWTH

## 2023-05-26 LAB — CBC
HCT: 34.2 % — ABNORMAL LOW (ref 36.0–46.0)
Hemoglobin: 11.2 g/dL — ABNORMAL LOW (ref 12.0–15.0)
MCH: 30.5 pg (ref 26.0–34.0)
MCHC: 32.7 g/dL (ref 30.0–36.0)
MCV: 93.2 fL (ref 80.0–100.0)
Platelets: 249 10*3/uL (ref 150–400)
RBC: 3.67 MIL/uL — ABNORMAL LOW (ref 3.87–5.11)
RDW: 15.1 % (ref 11.5–15.5)
WBC: 9.6 10*3/uL (ref 4.0–10.5)
nRBC: 0 % (ref 0.0–0.2)

## 2023-05-26 MED ORDER — ALUM & MAG HYDROXIDE-SIMETH 200-200-20 MG/5ML PO SUSP
30.0000 mL | Freq: Once | ORAL | Status: AC
  Administered 2023-05-26: 30 mL via ORAL
  Filled 2023-05-26: qty 30

## 2023-05-26 MED ORDER — POTASSIUM CHLORIDE CRYS ER 20 MEQ PO TBCR
40.0000 meq | EXTENDED_RELEASE_TABLET | Freq: Once | ORAL | Status: AC
  Administered 2023-05-26: 40 meq via ORAL
  Filled 2023-05-26: qty 2

## 2023-05-26 MED ORDER — IPRATROPIUM-ALBUTEROL 0.5-2.5 (3) MG/3ML IN SOLN
3.0000 mL | Freq: Four times a day (QID) | RESPIRATORY_TRACT | Status: DC
  Administered 2023-05-26 (×3): 3 mL via RESPIRATORY_TRACT
  Filled 2023-05-26 (×4): qty 3

## 2023-05-26 MED ORDER — ENOXAPARIN SODIUM 40 MG/0.4ML IJ SOSY
40.0000 mg | PREFILLED_SYRINGE | Freq: Every day | INTRAMUSCULAR | Status: DC
  Administered 2023-05-26 – 2023-05-28 (×3): 40 mg via SUBCUTANEOUS
  Filled 2023-05-26 (×3): qty 0.4

## 2023-05-26 NOTE — TOC Initial Note (Signed)
Transition of Care [facility]) - Initial/Assessment Note    Patient Details  Name: Cindy Padilla MRN: [id number] Date of Birth: Jul 27, 1940  [facility] CM/SW Contact:    Darleene Cleaver, LCSW Phone Number: 05/26/2023, 6:42 PM  Clinical Narrative:                  Patient is an 82 year old female who is alert and oriented x3.  Patient is from [facility].  Patient has been living there for about 11 years.  Per patient's daughter, the plan is to return if possible.  If patient needs to go to SNF first before returning she is in agreement for patient going to SNF.  Patient will need PT and OT to see her, CSW asked attending physician to order PT and OT once patient is able to participate with therapy.  TOC to continue to follow patient's progress throughout discharge planning.  Expected Discharge Plan: Assisted Living Barriers to Discharge: Continued Medical Work up   Patient Goals and CMS Choice Patient states their goals for this hospitalization and ongoing recovery are:: [facility].gov Compare Post Acute Care list provided to:: Patient Represenative (must comment) Choice offered to / list presented to : Adult Children [place] ownership interest in [facility]provided to:: Adult Children    Expected Discharge Plan and Services     Post Acute Care Choice: Home Health Living arrangements for the past 2 months: Assisted Living Facility                                      Prior Living Arrangements/Services Living arrangements for the past 2 months: Assisted Living Facility Lives with:: Facility Resident Patient language and need for interpreter reviewed:: Yes Do you feel safe going back to the place where you live?: Yes      Need for Family Participation in Patient Care: Yes (Comment) Care giver support system in place?: Yes (comment)   Criminal Activity/Legal Involvement Pertinent to Current  Situation/Hospitalization: No - Comment as needed  Activities of Daily Living   ADL Screening (condition at time of admission) Independently performs ADLs?: Yes (appropriate for developmental age) Is the patient deaf or have difficulty hearing?: No Does the patient have difficulty seeing, even when wearing glasses/contacts?: No Does the patient have difficulty concentrating, remembering, or making decisions?: No  Permission Sought/Granted Permission sought to share information with : Case Manager, Magazine features editor, Family Supports Permission granted to share information with : Yes, Release of Information Signed, Yes, Verbal Permission Granted  Share Information with NAME: Leonides Sake Daughter [phone]  [phone]  Permission granted to share info w AGENCY: ALF and SNF admissions        Emotional Assessment Appearance:: Appears stated age Attitude/Demeanor/Rapport: Engaged Affect (typically observed): Calm, Stable, Appropriate Orientation: : Oriented to Self, Oriented to Place, Oriented to  Time Alcohol / Substance Use: Not Applicable Psych Involvement: No (comment)  Admission diagnosis:  RSV (respiratory syncytial virus infection) [B33.8] COPD exacerbation (HCC) [J44.1] RSV bronchitis [J20.5] Patient Active Problem List   Diagnosis Date Noted   RSV bronchitis 05/24/2023   Melena    Heme positive stool    Acute GI bleeding 04/13/2020   Near syncope 10/15/2019   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   BRBPR (bright red blood per rectum) 04/28/2019   Acute exacerbation of chronic obstructive pulmonary disease (  COPD) (HCC) 02/26/2017   COPD with acute exacerbation (HCC) 02/24/2017   Bipolar 1 disorder (HCC) 02/24/2017   Normocytic anemia 02/24/2017   Morbid obesity (HCC) 04/14/2015   Upper airway cough syndrome 04/14/2015   Ventral hernia 04/24/2014   Constipation 04/24/2014   Hemorrhoids 04/22/2014   Abnormality of gait 09/30/2013    Dyskinesia, 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   Chronic diastolic CHF (congestive heart failure) (HCC) 07/27/2013   Dementia with behavioral disturbance (HCC) 07/27/2013   Weakness 02/09/2013   Hyponatremia 02/09/2013   Shortness of breath 02/09/2013   HTN (hypertension) 02/09/2013   Other and unspecified hyperlipidemia 02/09/2013   PCP:  Valere Dross, FNP Pharmacy:  No Pharmacies Listed    Social Drivers of Health (SDOH) Social History: SDOH Screenings   Food Insecurity: No Food Insecurity (05/24/2023)  Housing: Low Risk  (05/24/2023)  Transportation Needs: No Transportation Needs (05/24/2023)  Utilities: Not At Risk (05/24/2023)  Tobacco Use: Medium Risk (05/24/2023)   SDOH Interventions:     Readmission Risk Interventions     No data to display

## 2023-05-26 NOTE — Progress Notes (Signed)
PROGRESS NOTE    Cindy Padilla  ZOX:[id number] DOB: September 03, 1940 DOA: 05/24/2023 PCP: Valere Dross, FNP   Brief Narrative: 82 year old with past medical history significant for COPD not on oxygen, dementia, anemia, bipolar 1 disorder, heart failure preserved ejection fraction moderate aortic stenosis and hypertension presents with altered mental status, hypoxic respiratory failure found to be positive for RSV.   Assessment & Plan:   Principal Problem:   RSV bronchitis  1-Acute Hypoxic Respiratory Failure in the setting of RSV bronchitis with superimposed Bacterial Pneumonia:, Acute COPD -Patient presented with worsening shortness of breath, hypoxia, chest x-ray diffuse interstitial prominence and peribronchial cuffing concerning for acute bronchitis -Patient was found to be RSV positive -Continue with supportive care. -Continue with DuoNeb, Pulmicort, Brovana -Continue with guaifenesin, Tessalon.  -Continue 3 L oxygen supplementation -Continue with IV ceftriaxone and azithromycin day 3/5 Continue with current managements.   Acute metabolic encephalopathy -In the setting of viral illness.  -Resume Home dose Seroquel.  -Resume home dose Ativan twice daily schedule.   Chronic heart failure preserved ejection fraction: Aortic stenosis -Resume torsemide -Received IV Lasix 12/28 -Follow echo: severe aortic stenosis. Needs to follow up with cardiology -Negative 1.3 L Monitor.   Mild renal insufficiency: -Present  with a creatinine 1.4, -Improved.  Monitor renal function  Hypertension: -Continue with  Norvasc, torsemide  Hyperlipidemia: -Continue statins  History of bipolar disorder Resume Seroquel and Ativan  Estimated body mass index is 30.87 kg/m as calculated from the following:   Height as of this encounter: 5' (1.524 m).   Weight as of this encounter: 71.7 kg.   DVT prophylaxis: Lovenox  Code Status: DNR Family Communication: Try to contact family, no  answer.  Disposition Plan:  Status is: Inpatient Remains inpatient appropriate because: management of Resp failure.     Consultants:    Procedures:  ECHO  Antimicrobials:    Subjective: She is still having SOB, Cough. Lungs with BL ronchus.    Objective: Vitals:   05/26/23 0455 05/26/23 0947 05/26/23 1129 05/26/23 1255  BP: (!) 150/55 (!) 156/72  130/70  Pulse: 80 81  84  Resp: 18 18    Temp: 97.8 F (36.6 C) 97.9 F (36.6 C)  98.6 F (37 C)  TempSrc: Oral Oral    SpO2: 97% 99% 99% 98%  Weight:      Height:        Intake/Output Summary (Last 24 hours) at 05/26/2023 1311 Last data filed at 05/26/2023 0900 Gross per 24 hour  Intake 1040 ml  Output 1400 ml  Net -360 ml   Filed Weights   05/24/23 1116 05/25/23 0407  Weight: 73.2 kg 71.7 kg    Examination:  General exam: NAD Respiratory system: Normal Respiratory effort, BL Ronchus.  Cardiovascular system: S 1, S 2 RRR Gastrointestinal system: BS present, soft, NT Central nervous system: alert.   Data Reviewed: I have personally reviewed following labs and imaging studies  CBC: Recent Labs  Lab 05/22/23 1203 05/24/23 1114 05/24/23 1556 05/25/23 0506 05/25/23 0835 05/26/23 0447  WBC 9.2 9.9 6.9 5.9 10.5 9.6  NEUTROABS 6.3 6.3  --   --   --   --   HGB 11.4* 11.5* 10.9* 16.0* 11.4* 11.2*  HCT 33.4* 35.6* 33.8* 47.0* 35.8* 34.2*  MCV 92.8 92.0 94.2 89.9 93.0 93.2  PLT 203 212 213 102* 235 249   Basic Metabolic Panel: Recent Labs  Lab 05/22/23 1203 05/24/23 1114 05/24/23 1430 05/25/23 0506 05/26/23 0447  NA 131* 135  --  138 137  K 3.2* 3.5  --  3.5 3.6  CL 94* 97*  --  100 100  CO2 24 25  --  27 25  GLUCOSE 104* 128*  --  141* 152*  BUN 20 25*  --  26* 30*  CREATININE 1.18* 1.40* 1.21* 0.89 0.83  CALCIUM 9.0 9.2  --  9.1 9.1   GFR: Estimated Creatinine Clearance: 45.4 mL/min (by C-G formula based on SCr of 0.83 mg/dL). Liver Function Tests: Recent Labs  Lab 05/24/23 1114  AST 28   ALT 19  ALKPHOS 93  BILITOT 0.7  PROT 7.4  ALBUMIN 3.6   No results for input(s): "LIPASE", "AMYLASE" in the last 168 hours. Recent Labs  Lab 05/24/23 1710  AMMONIA 25   Coagulation Profile: No results for input(s): "INR", "PROTIME" in the last 168 hours. Cardiac Enzymes: No results for input(s): "CKTOTAL", "CKMB", "CKMBINDEX", "TROPONINI" in the last 168 hours. BNP (last 3 results) No results for input(s): "PROBNP" in the last 8760 hours. HbA1C: No results for input(s): "HGBA1C" in the last 72 hours. CBG: Recent Labs  Lab 05/24/23 1453  GLUCAP 179*   Lipid Profile: No results for input(s): "CHOL", "HDL", "LDLCALC", "TRIG", "CHOLHDL", "LDLDIRECT" in the last 72 hours. Thyroid Function Tests: No results for input(s): "TSH", "T4TOTAL", "FREET4", "T3FREE", "THYROIDAB" in the last 72 hours. Anemia Panel: Recent Labs    05/24/23 1556  VITAMINB12 203   Sepsis Labs: Recent Labs  Lab 05/24/23 1135 05/24/23 1430  PROCALCITON  --  0.24  LATICACIDVEN 1.8  --     Recent Results (from the past 240 hours)  Resp panel by RT-PCR (RSV, Flu A&B, Covid) Anterior Nasal Swab     Status: Abnormal   Collection Time: 05/24/23 11:36 AM   Specimen: Anterior Nasal Swab  Result Value Ref Range Status   SARS Coronavirus 2 by RT PCR NEGATIVE NEGATIVE Final    Comment: (NOTE) SARS-CoV-2 target nucleic acids are NOT DETECTED.  The SARS-CoV-2 RNA is generally detectable in upper respiratory specimens during the acute phase of infection. The lowest concentration of SARS-CoV-2 viral copies this assay can detect is 138 copies/mL. A negative result does not preclude SARS-Cov-2 infection and should not be used as the sole basis for treatment or other patient management decisions. A negative result may occur with  improper specimen collection/handling, submission of specimen other than nasopharyngeal swab, presence of viral mutation(s) within the areas targeted by this assay, and inadequate  number of viral copies(<138 copies/mL). A negative result must be combined with clinical observations, patient history, and epidemiological information. The expected result is Negative.  Fact Sheet for Patients:  BloggerCourse.com  Fact Sheet for Healthcare Providers:  SeriousBroker.it  This test is no t yet approved or cleared by the [place] FDA and  has been authorized for detection and/or diagnosis of SARS-CoV-2 by FDA under an Emergency Use Authorization (EUA). This EUA will remain  in effect (meaning this test can be used) for the duration of the COVID-19 declaration under Section 564(b)(1) of the Act, 21 U.S.C.section 360bbb-3(b)(1), unless the authorization is terminated  or revoked sooner.       Influenza A by PCR NEGATIVE NEGATIVE Final   Influenza B by PCR NEGATIVE NEGATIVE Final    Comment: (NOTE) The Xpert Xpress SARS-CoV-2/FLU/RSV plus assay is intended as an aid in the diagnosis of influenza from Nasopharyngeal swab specimens and should not be used as a sole basis for treatment. Nasal washings and aspirates are unacceptable for Xpert  Xpress SARS-CoV-2/FLU/RSV testing.  Fact Sheet for Patients: BloggerCourse.com  Fact Sheet for Healthcare Providers: SeriousBroker.it  This test is not yet approved or cleared by the [place] FDA and has been authorized for detection and/or diagnosis of SARS-CoV-2 by FDA under an Emergency Use Authorization (EUA). This EUA will remain in effect (meaning this test can be used) for the duration of the COVID-19 declaration under Section 564(b)(1) of the Act, 21 U.S.C. section 360bbb-3(b)(1), unless the authorization is terminated or revoked.     Resp Syncytial Virus by PCR POSITIVE (A) NEGATIVE Final    Comment: (NOTE) Fact Sheet for Patients: BloggerCourse.com  Fact Sheet for Healthcare  Providers: SeriousBroker.it  This test is not yet approved or cleared by the [place] FDA and has been authorized for detection and/or diagnosis of SARS-CoV-2 by FDA under an Emergency Use Authorization (EUA). This EUA will remain in effect (meaning this test can be used) for the duration of the COVID-19 declaration under Section 564(b)(1) of the Act, 21 U.S.C. section 360bbb-3(b)(1), unless the authorization is terminated or revoked.  Performed at [facility], [address] [place], [place] [address]   Urine Culture (for pregnant, neutropenic or urologic patients or patients with an indwelling urinary catheter)     Status: None   Collection Time: 05/24/23  7:30 PM   Specimen: In/Out Cath Urine  Result Value Ref Range Status   Specimen Description   Final    IN/OUT CATH URINE Performed at [facility], [address] [place], [place] [address]    Special Requests   Final    NONE Performed at [facility], [address], [place], [place] [address]    Culture   Final    NO GROWTH Performed at [facility], [address], [place], [place] [address]    Report Status 05/26/2023 FINAL  Final         Radiology Studies: ECHOCARDIOGRAM COMPLETE Result Date: 05/25/2023    ECHOCARDIOGRAM REPORT   Patient Name:   Cindy Padilla Date of Exam: 05/25/2023 Medical Rec #:  [id number]             Height:       60.0 in Accession #:    [id number]            Weight:       158.1 lb Date of Birth:  1940/11/13             BSA:          1.689 m Patient Age:    82 years              BP:           165/96 mmHg Patient Gender: F                     HR:           87 bpm. Exam Location:  Inpatient Procedure: 2D Echo, Cardiac Doppler and Color Doppler Indications:    I35.0 Nonrheumatic aortic (valve) stenosis  History:        Patient has prior history of Echocardiogram examinations, most                  recent 10/16/2019. CHF, COPD, Signs/Symptoms:Altered Mental                 Status, Alzheimer's, Shortness of Breath and Dyspnea; Risk  Factors:Hypertension. RSV positive.  Sonographer:    Sheralyn Boatman RDCS Referring Phys: [id number] Russella Dar  Sonographer Comments: Technically difficult study due to poor echo windows. Image acquisition challenging due to patient body habitus and Image acquisition challenging due to uncooperative patient. Patient had inspirometer in mouth and would not take it out. Patient talking throughout exam. IMPRESSIONS  1. Left ventricular ejection fraction, by estimation, is 65 to 70%. The left ventricle has normal function. The left ventricle has no regional wall motion abnormalities. There is mild left ventricular hypertrophy. Left ventricular diastolic parameters are consistent with Grade I diastolic dysfunction (impaired relaxation).  2. Right ventricular systolic function is normal. The right ventricular size is normal. The estimated right ventricular systolic pressure is 25.3 mmHg.  3. Large pleural effusion.  4. The mitral valve is degenerative. Trivial mitral valve regurgitation. Mild mitral stenosis. The mean mitral valve gradient is 7.0 mmHg with average heart rate of 85 bpm.  5. The aortic valve is tricuspid. Aortic valve regurgitation is not visualized. Severe aortic valve stenosis. Aortic valve area, by VTI measures 0.74 cm. Aortic valve mean gradient measures 47.0 mmHg. Aortic valve Vmax measures 3.89 m/s. Peak gradient 60.6 mmHg, DI 0.33.  6. The inferior vena cava is normal in size with <50% respiratory variability, suggesting right atrial pressure of 8 mmHg. Comparison(s): Changes from prior study are noted. 10/16/2019: LVEF 65-70%, moderate AS- MG 35 mmHg, DI 0.30. FINDINGS  Left Ventricle: Left ventricular ejection fraction, by estimation, is 65 to 70%. The left ventricle has normal function. The left ventricle has no regional wall motion abnormalities. The left  ventricular internal cavity size was normal in size. There is  mild left ventricular hypertrophy. Left ventricular diastolic parameters are consistent with Grade I diastolic dysfunction (impaired relaxation). Right Ventricle: The right ventricular size is normal. No increase in right ventricular wall thickness. Right ventricular systolic function is normal. The tricuspid regurgitant velocity is 2.08 m/s, and with an assumed right atrial pressure of 8 mmHg, the estimated right ventricular systolic pressure is 25.3 mmHg. Left Atrium: Left atrial size was normal in size. Right Atrium: Right atrial size was normal in size. Pericardium: There is no evidence of pericardial effusion. Mitral Valve: The mitral valve is degenerative in appearance. Trivial mitral valve regurgitation. Mild mitral valve stenosis. MV peak gradient, 12.0 mmHg. The mean mitral valve gradient is 7.0 mmHg with average heart rate of 85 bpm. Tricuspid Valve: The tricuspid valve is normal in structure. Tricuspid valve regurgitation is mild . No evidence of tricuspid stenosis. Aortic Valve: The aortic valve is tricuspid. Aortic valve regurgitation is not visualized. Severe aortic stenosis is present. Aortic valve mean gradient measures 47.0 mmHg. Aortic valve peak gradient measures 60.6 mmHg. Aortic valve area, by VTI measures  0.74 cm. Pulmonic Valve: The pulmonic valve was normal in structure. Pulmonic valve regurgitation is not visualized. No evidence of pulmonic stenosis. Aorta: The aortic root and ascending aorta are structurally normal, with no evidence of dilitation. Venous: The inferior vena cava is normal in size with less than 50% respiratory variability, suggesting right atrial pressure of 8 mmHg. IAS/Shunts: No atrial level shunt detected by color flow Doppler. Additional Comments: There is a large pleural effusion.  LEFT VENTRICLE PLAX 2D LVIDd:         3.90 cm     Diastology LVIDs:         2.20 cm     LV e' medial:    4.68 cm/s LV PW:  1.10 cm     LV E/e' medial:  20.1 LV IVS:        1.00 cm     LV e' lateral:   8.05 cm/s LVOT diam:     1.70 cm     LV E/e' lateral: 11.7 LV SV:         63 LV SV Index:   37 LVOT Area:     2.27 cm  LV Volumes (MOD) LV vol d, MOD A2C: 85.1 ml LV vol d, MOD A4C: 64.6 ml LV vol s, MOD A2C: 35.4 ml LV vol s, MOD A4C: 19.3 ml LV SV MOD A2C:     49.7 ml LV SV MOD A4C:     64.6 ml LV SV MOD BP:      46.2 ml RIGHT VENTRICLE             IVC RV S prime:     15.00 cm/s  IVC diam: 1.90 cm TAPSE (M-mode): 2.3 cm LEFT ATRIUM             Index        RIGHT ATRIUM          Index LA diam:        3.70 cm 2.19 cm/m   RA Area:     8.35 cm LA Vol (A2C):   34.9 ml 20.66 ml/m  RA Volume:   13.80 ml 8.17 ml/m LA Vol (A4C):   27.0 ml 15.99 ml/m LA Biplane Vol: 33.9 ml 20.07 ml/m  AORTIC VALVE AV Area (Vmax):    0.85 cm AV Area (Vmean):   0.79 cm AV Area (VTI):     0.74 cm AV Vmax:           389.20 cm/s AV Vmean:          283.800 cm/s AV VTI:            0.850 m AV Peak Grad:      60.6 mmHg AV Mean Grad:      47.0 mmHg LVOT Vmax:         145.00 cm/s LVOT Vmean:        99.300 cm/s LVOT VTI:          0.278 m LVOT/AV VTI ratio: 0.33  AORTA Ao Root diam: 2.60 cm Ao Asc diam:  3.40 cm MITRAL VALVE                TRICUSPID VALVE MV Area (PHT): 4.34 cm     TR Peak grad:   17.3 mmHg MV Peak grad:  12.0 mmHg    TR Vmax:        208.00 cm/s MV Mean grad:  7.0 mmHg MV Vmax:       1.73 m/s     SHUNTS MV Vmean:      130.0 cm/s   Systemic VTI:  0.28 m MV Decel Time: 175 msec     Systemic Diam: 1.70 cm MV E velocity: 93.90 cm/s MV A velocity: 122.33 cm/s MV E/A ratio:  0.77 Zoila Shutter MD Electronically signed by Zoila Shutter MD Signature Date/Time: 05/25/2023/11:53:47 AM    Final    CT HEAD WO CONTRAST ( ) Result Date: 05/24/2023 CLINICAL DATA:  Altered mentation, dementia EXAM: CT HEAD WITHOUT CONTRAST TECHNIQUE: Contiguous axial images were obtained from the base of the skull through the vertex without intravenous contrast. RADIATION  DOSE REDUCTION: This exam was performed according to the departmental dose-optimization program which includes automated exposure control, adjustment  of the mA and/or kV according to patient size and/or use of iterative reconstruction technique. COMPARISON:  05/22/2023 CT head FINDINGS: Brain: No evidence of acute infarction, hemorrhage, mass, mass effect, or midline shift. No hydrocephalus or extra-axial fluid collection. Periventricular white matter changes, likely the sequela of chronic small vessel ischemic disease. Cerebral volume is within normal limits for age. Vascular: No hyperdense vessel. Skull: Negative for fracture or focal lesion. Sinuses/Orbits: Mucosal thickening throughout the paranasal sinuses, most prominent in the ethmoid air cells. Status post bilateral lens replacements. Other: The mastoid air cells are well aerated. IMPRESSION: No acute intracranial process. Electronically Signed   By: Wiliam Ke M.D.   On: 05/24/2023 21:40        Scheduled Meds:  amLODipine  5 mg Oral Daily   arformoterol  15 mcg Nebulization BID   atorvastatin  20 mg Oral QPM   budesonide (PULMICORT) nebulizer solution  0.25 mg Nebulization BID   clopidogrel  75 mg Oral Daily   cyanocobalamin  1,000 mcg Intramuscular Daily   donepezil  10 mg Oral QHS   enoxaparin (LOVENOX) injection  40 mg Subcutaneous QHS   guaiFENesin  1,200 mg Oral BID   ipratropium-albuterol  3 mL Nebulization QID   lamoTRIgine  200 mg Oral BID   LORazepam  1 mg Oral BID   methylPREDNISolone (SOLU-MEDROL) injection  40 mg Intravenous Q12H   montelukast  10 mg Oral QHS   QUEtiapine  100 mg Oral Daily   QUEtiapine  150 mg Oral QHS   sodium chloride flush  3 mL Intravenous Q12H   torsemide  60 mg Oral Daily   Continuous Infusions:  azithromycin (ZITHROMAX) 500 mg in sodium chloride 0.9 % 250 mL IVPB 500 mg (05/25/23 1604)   cefTRIAXone (ROCEPHIN)  IV 2 g (05/25/23 1354)     LOS: 2 days    Time spent: 35 minutes.      [name], MD [facility]   If 7PM-7AM, please contact night-coverage [email or web address]  05/26/2023, 1:11 PM

## 2023-05-26 NOTE — Plan of Care (Signed)
  Problem: Health Behavior/Discharge Planning: Goal: Ability to manage health-related needs will improve Outcome: Progressing   Problem: Clinical Measurements: Goal: Diagnostic test results will improve Outcome: Progressing   Problem: Coping: Goal: Level of anxiety will decrease Outcome: Progressing   Problem: Skin Integrity: Goal: Risk for impaired skin integrity will decrease Outcome: Progressing

## 2023-05-27 DIAGNOSIS — J205 Acute bronchitis due to respiratory syncytial virus: Secondary | ICD-10-CM | POA: Diagnosis not present

## 2023-05-27 MED ORDER — IPRATROPIUM-ALBUTEROL 0.5-2.5 (3) MG/3ML IN SOLN
3.0000 mL | Freq: Three times a day (TID) | RESPIRATORY_TRACT | Status: DC
  Administered 2023-05-27 – 2023-05-29 (×4): 3 mL via RESPIRATORY_TRACT
  Filled 2023-05-27 (×7): qty 3

## 2023-05-27 MED ORDER — ALUM & MAG HYDROXIDE-SIMETH 200-200-20 MG/5ML PO SUSP
30.0000 mL | ORAL | Status: DC | PRN
  Administered 2023-05-27: 30 mL via ORAL
  Filled 2023-05-27: qty 30

## 2023-05-27 MED ORDER — PREDNISONE 20 MG PO TABS
40.0000 mg | ORAL_TABLET | Freq: Every day | ORAL | Status: DC
  Administered 2023-05-28 – 2023-05-29 (×2): 40 mg via ORAL
  Filled 2023-05-27 (×2): qty 2

## 2023-05-27 NOTE — Evaluation (Signed)
 Occupational Therapy Evaluation Patient Details Name: Cindy Padilla MRN: [id number] DOB: 1941-04-15 Today's Date: 05/27/2023   History of Present Illness Ms. Flicker is a 82 yr old female admitted to the hospital with altered mental status and shortness of breath. She was found to have RSV bronchitis and acute metabolic encephalopathy. PMH: COPD, dementia, anemia, bipolar disorder, heart failure, moderate aortic stenosis, HTN, arthritis   Clinical Impression   The pt is currently with the below listed deficits, which compromises her ADL performance and overall functional independence (see OT problem list). During the session today, she required. Mod to max assist for supine to sit, lower body dressing, sit to stand using a RW, and to step-pivot to the bedside chair using a RW. She was also noted to be with unsteadiness in standing, generalized weakness, and general deconditioning. She will benefit from further OT services to maximize her safety and independence with self-care tasks and to decrease the risk for further weakness and deconditioning. Patient will benefit from continued inpatient follow up therapy, <3 hours/day.        If plan is discharge home, recommend the following: A lot of help with walking and/or transfers;A lot of help with bathing/dressing/bathroom;Direct supervision/assist for medications management;Supervision due to cognitive status    Functional Status Assessment  Patient has had a recent decline in their functional status and demonstrates the ability to make significant improvements in function in a reasonable and predictable amount of time.  Equipment Recommendations  Other (comment) (defer to next level of care)    Recommendations for Other Services       Precautions / Restrictions Precautions Precautions: Fall Restrictions Weight Bearing Restrictions Per Provider Order: No      Mobility Bed Mobility Overal bed mobility: Needs Assistance Bed  Mobility: Supine to Sit     Supine to sit: Used rails, HOB elevated, Max assist     General bed mobility comments: max assist for legs and trunk, use of bed pad to slide to edge    Transfers Overall transfer level: Needs assistance Equipment used: Rolling walker (2 wheels) Transfers: Sit to/from Stand, Bed to chair/wheelchair/BSC Sit to Stand: +2 safety/equipment, +2 physical assistance, Mod assist     Step pivot transfers: Mod assist, +2 physical assistance, +2 safety/equipment            Balance     Sitting balance-Leahy Scale: Fair       Standing balance-Leahy Scale: Poor           ADL either performed or assessed with clinical judgement   ADL Overall ADL's : Needs assistance/impaired Eating/Feeding: Set up;Sitting   Grooming: Set up;Supervision/safety;Sitting           Upper Body Dressing : Moderate assistance;Sitting   Lower Body Dressing: Maximal assistance;Sitting/lateral leans       Toileting- Clothing Manipulation and Hygiene: Maximal assistance;Sit to/from stand Toileting - Clothing Manipulation Details (indicate cue type and reason): at bedside commode level, based on clinical judgement              Pertinent Vitals/Pain Pain Assessment Pain Assessment: Faces Pain Score: 3  Pain Location: She reported having chronic neck and knee pain due to arthritis. Pain Intervention(s): Limited activity within patient's tolerance, Monitored during session     Extremity/Trunk Assessment Upper Extremity Assessment Upper Extremity Assessment: Right hand dominant;Generalized weakness   Lower Extremity Assessment Lower Extremity Assessment: Generalized weakness      Communication     Cognition Arousal: Alert Behavior During Therapy: [facility]  for tasks assessed/performed Overall Cognitive Status: History of cognitive impairments - at baseline Area of Impairment: Memory, Orientation          Memory: Decreased short-term memory, Decreased  recall of precautions         General Comments: Oriented to person and year, disoriented to month and situation. Able to follow 1 step commands with occasional light repetition of prompts            Home Living Family/patient expects to be discharged to:: Assisted living        Home Equipment: Agricultural Consultant (2 wheels)   Additional Comments: resides at [facility]      Prior Functioning/Environment Prior Level of Function : Needs assist             Mobility Comments:  (She reported use of a RW for ambulation.) ADLs Comments:  (She reported dressing and toileting herself, however requiring assist for bathing.)        OT Problem List: Decreased strength;Decreased activity tolerance;Impaired balance (sitting and/or standing);Decreased cognition;Decreased knowledge of use of DME or AE;Decreased knowledge of precautions;Pain      OT Treatment/Interventions: Self-care/ADL training;Therapeutic exercise;Energy conservation;DME and/or AE instruction;Patient/family education;Balance training;Therapeutic activities    OT Goals(Current goals can be found in the care plan section) Acute Rehab OT Goals OT Goal Formulation: With patient Time For Goal Achievement: 06/10/23 Potential to Achieve Goals: Good ADL Goals Pt Will Perform Upper Body Dressing: with set-up;sitting Pt Will Perform Lower Body Dressing: with supervision;sit to/from stand Pt Will Transfer to Toilet: with supervision;ambulating Pt Will Perform Toileting - Clothing Manipulation and hygiene: with supervision;sit to/from stand  OT Frequency: Min 1X/week       AM-PAC OT 6 Clicks Daily Activity     Outcome Measure Help from another person eating meals?: A Little Help from another person taking care of personal grooming?: A Little Help from another person toileting, which includes using toliet, bedpan, or urinal?: A Lot Help from another person bathing (including washing, rinsing, drying)?: A Lot Help from  another person to put on and taking off regular upper body clothing?: A Lot Help from another person to put on and taking off regular lower body clothing?: A Lot 6 Click Score: 14   End of Session Equipment Utilized During Treatment: Gait belt;Rolling walker (2 wheels) Nurse Communication: Mobility status  Activity Tolerance: Other (comment) (Fair tolerance) Patient left: in chair;with call bell/phone within reach;with chair alarm set  OT Visit Diagnosis: Unsteadiness on feet (R26.81);Muscle weakness (generalized) (M62.81)                Time: 8652-8593 OT Time Calculation (min): 19 min Charges:  OT General Charges $OT Visit: 1 Visit OT Evaluation $OT Eval Moderate Complexity: 1 Mod   Marshal Schrecengost L Caidence Kaseman, OTR/L 05/27/2023, 2:32 PM

## 2023-05-27 NOTE — Plan of Care (Signed)

## 2023-05-27 NOTE — Progress Notes (Addendum)
 PROGRESS NOTE    Cindy Padilla  FMW:[id number] DOB: [birth year] DOA: 05/24/2023 PCP: Ginny Bale, FNP   Brief Narrative: 82 year old with past medical history significant for COPD not on oxygen , dementia, anemia, bipolar 1 disorder, heart failure preserved ejection fraction moderate aortic stenosis and hypertension presents with altered mental status, hypoxic respiratory failure found to be positive for RSV.   Assessment & Plan:   Principal Problem:   RSV bronchitis  1-Acute Hypoxic Respiratory Failure in the setting of RSV bronchitis with superimposed Bacterial Pneumonia:, Acute COPD -Patient presented with worsening shortness of breath, hypoxia, chest x-ray diffuse interstitial prominence and peribronchial cuffing concerning for acute bronchitis -Patient was found to be RSV positive -Continue with supportive care. -Continue with DuoNeb, Pulmicort , Brovana  -Continue with guaifenesin , Tessalon .  -Continue 3 L oxygen  supplementation -Continue with IV ceftriaxone  and azithromycin  day 4/5 Continue with current managements.  Improving today.  Change Solumedrol to prednisone .   Acute metabolic encephalopathy -In the setting of viral illness.  -Resume Home dose Seroquel .  -Resume home dose Ativan  twice daily schedule.  Improved.   Chronic heart failure preserved ejection fraction: Aortic stenosis -Resume torsemide  -Received IV Lasix  12/28 -Follow echo: severe aortic stenosis. Needs to follow up with cardiology -Negative 3 L Monitor.   Mild renal insufficiency: -Present  with a creatinine 1.4, -Improved.  Monitor renal function  Hypertension: -Continue with  Norvasc , torsemide   Hyperlipidemia: -Continue statins  History of bipolar disorder Resume Seroquel  and Ativan   Estimated body mass index is 30.87 kg/m as calculated from the following:   Height as of this encounter: 5' (1.524 m).   Weight as of this encounter: 71.7 kg.   DVT prophylaxis: Lovenox    Code Status: DNR Family Communication: Daughter. 12/31 Disposition Plan:  Status is: Inpatient Remains inpatient appropriate because: management of Resp failure. Discharge 05/28/2023    Consultants:    Procedures:  ECHO  Antimicrobials:    Subjective: She is feeling better, cough improving, dyspnea improved.   Objective: Vitals:   05/26/23 1929 05/26/23 2218 05/27/23 0600 05/27/23 1148  BP: (!) 163/75  (!) 171/62 (!) 153/78  Pulse: 98  76 86  Resp: 20  20 18   Temp: 98.3 F (36.8 C)  97.9 F (36.6 C) 97.9 F (36.6 C)  TempSrc:      SpO2: 90% 95% 100% 97%  Weight:      Height:        Intake/Output Summary (Last 24 hours) at 05/27/2023 1501 Last data filed at 05/27/2023 0606 Gross per 24 hour  Intake 350 ml  Output 2300 ml  Net -1950 ml   Filed Weights   05/24/23 1116 05/25/23 0407  Weight: 73.2 kg 71.7 kg    Examination:  General exam: NAD Respiratory system: Less [place].  Cardiovascular system: S,1 S 2  RRR Gastrointestinal system: BS present, soft nt Central nervous system: Alert  Data Reviewed: I have personally reviewed following labs and imaging studies  CBC: Recent Labs  Lab 05/22/23 1203 05/24/23 1114 05/24/23 1556 05/25/23 0506 05/25/23 0835 05/26/23 0447  WBC 9.2 9.9 6.9 5.9 10.5 9.6  NEUTROABS 6.3 6.3  --   --   --   --   HGB 11.4* 11.5* 10.9* 16.0* 11.4* 11.2*  HCT 33.4* 35.6* 33.8* 47.0* 35.8* 34.2*  MCV 92.8 92.0 94.2 89.9 93.0 93.2  PLT 203 212 213 102* 235 249   Basic Metabolic Panel: Recent Labs  Lab 05/22/23 1203 05/24/23 1114 05/24/23 1430 05/25/23 0506 05/26/23 0447  NA 131* 135  --  138 137  K 3.2* 3.5  --  3.5 3.6  CL 94* 97*  --  100 100  CO2 24 25  --  27 25  GLUCOSE 104* 128*  --  141* 152*  BUN 20 25*  --  26* 30*  CREATININE 1.18* 1.40* 1.21* 0.89 0.83  CALCIUM  9.0 9.2  --  9.1 9.1   GFR: Estimated Creatinine Clearance: 45.4 mL/min (by C-G formula based on SCr of 0.83 mg/dL). Liver Function  Tests: Recent Labs  Lab 05/24/23 1114  AST 28  ALT 19  ALKPHOS 93  BILITOT 0.7  PROT 7.4  ALBUMIN 3.6   No results for input(s): LIPASE, AMYLASE in the last 168 hours. Recent Labs  Lab 05/24/23 1710  AMMONIA 25   Coagulation Profile: No results for input(s): INR, PROTIME in the last 168 hours. Cardiac Enzymes: No results for input(s): CKTOTAL, CKMB, CKMBINDEX, TROPONINI in the last 168 hours. BNP (last 3 results) No results for input(s): PROBNP in the last 8760 hours. HbA1C: No results for input(s): HGBA1C in the last 72 hours. CBG: Recent Labs  Lab 05/24/23 1453  GLUCAP 179*   Lipid Profile: No results for input(s): CHOL, HDL, LDLCALC, TRIG, CHOLHDL, LDLDIRECT in the last 72 hours. Thyroid  Function Tests: No results for input(s): TSH, T4TOTAL, FREET4, T3FREE, THYROIDAB in the last 72 hours. Anemia Panel: Recent Labs    05/24/23 1556  VITAMINB12 203   Sepsis Labs: Recent Labs  Lab 05/24/23 1135 05/24/23 1430  PROCALCITON  --  0.24  LATICACIDVEN 1.8  --     Recent Results (from the past 240 hours)  Resp panel by RT-PCR (RSV, Flu A&B, Covid) Anterior Nasal Swab     Status: Abnormal   Collection Time: 05/24/23 11:36 AM   Specimen: Anterior Nasal Swab  Result Value Ref Range Status   SARS Coronavirus 2 by RT PCR NEGATIVE NEGATIVE Final    Comment: (NOTE) SARS-CoV-2 target nucleic acids are NOT DETECTED.  The SARS-CoV-2 RNA is generally detectable in upper respiratory specimens during the acute phase of infection. The lowest concentration of SARS-CoV-2 viral copies this assay can detect is 138 copies/mL. A negative result does not preclude SARS-Cov-2 infection and should not be used as the sole basis for treatment or other patient management decisions. A negative result may occur with  improper specimen collection/handling, submission of specimen other than nasopharyngeal swab, presence of viral mutation(s) within  the areas targeted by this assay, and inadequate number of viral copies(<138 copies/mL). A negative result must be combined with clinical observations, patient history, and epidemiological information. The expected result is Negative.  Fact Sheet for Patients:  bloggercourse.com  Fact Sheet for Healthcare Providers:  seriousbroker.it  This test is no t yet approved or cleared by the [place]  FDA and  has been authorized for detection and/or diagnosis of SARS-CoV-2 by FDA under an Emergency Use Authorization (EUA). This EUA will remain  in effect (meaning this test can be used) for the duration of the COVID-19 declaration under Section 564(b)(1) of the Act, 21 U.S.C.section 360bbb-3(b)(1), unless the authorization is terminated  or revoked sooner.       Influenza A by PCR NEGATIVE NEGATIVE Final   Influenza B by PCR NEGATIVE NEGATIVE Final    Comment: (NOTE) The Xpert Xpress SARS-CoV-2/FLU/RSV plus assay is intended as an aid in the diagnosis of influenza from Nasopharyngeal swab specimens and should not be used as a sole basis for treatment. Nasal washings and aspirates are unacceptable for Xpert  Xpress SARS-CoV-2/FLU/RSV testing.  Fact Sheet for Patients: bloggercourse.com  Fact Sheet for Healthcare Providers: seriousbroker.it  This test is not yet approved or cleared by the [place]  FDA and has been authorized for detection and/or diagnosis of SARS-CoV-2 by FDA under an Emergency Use Authorization (EUA). This EUA will remain in effect (meaning this test can be used) for the duration of the COVID-19 declaration under Section 564(b)(1) of the Act, 21 U.S.C. section 360bbb-3(b)(1), unless the authorization is terminated or revoked.     Resp Syncytial Virus by PCR POSITIVE (A) NEGATIVE Final    Comment: (NOTE) Fact Sheet for  Patients: bloggercourse.com  Fact Sheet for Healthcare Providers: seriousbroker.it  This test is not yet approved or cleared by the [place]  FDA and has been authorized for detection and/or diagnosis of SARS-CoV-2 by FDA under an Emergency Use Authorization (EUA). This EUA will remain in effect (meaning this test can be used) for the duration of the COVID-19 declaration under Section 564(b)(1) of the Act, 21 U.S.C. section 360bbb-3(b)(1), unless the authorization is terminated or revoked.  Performed at [facility], [address], [place], [place] [address]   Urine Culture (for pregnant, neutropenic or urologic patients or patients with an indwelling urinary catheter)     Status: None   Collection Time: 05/24/23  7:30 PM   Specimen: In/Out Cath Urine  Result Value Ref Range Status   Specimen Description   Final    IN/OUT CATH URINE Performed at [facility], [address] [place], [place] [address]    Special Requests   Final    NONE Performed at [facility], [address] [place], [place] [address]    Culture   Final    NO GROWTH Performed at [facility], [address] [place], [place] [address]    Report Status 05/26/2023 FINAL  Final         Radiology Studies: DG Chest 2 View Result Date: 05/26/2023 CLINICAL DATA:  Pleural effusion EXAM: CHEST - 2 VIEW COMPARISON:  05/24/2023 FINDINGS: Frontal and lateral views of the chest demonstrate stable nerve stimulator within the left anterior chest. The cardiac silhouette is stable. Central pulmonary vascular congestion, with progressive diffuse interstitial prominence. This could reflect interstitial edema or bronchitis. No lobar consolidation, effusion, or pneumothorax. IMPRESSION: 1. Diffuse interstitial prominence, which may reflect bronchitis or developing interstitial edema. Electronically Signed    By: Ozell Daring M.D.   On: 05/26/2023 19:29        Scheduled Meds:  amLODipine   5 mg Oral Daily   arformoterol   15 mcg Nebulization BID   atorvastatin   20 mg Oral QPM   budesonide  (PULMICORT ) nebulizer solution  0.25 mg Nebulization BID   clopidogrel   75 mg Oral Daily   cyanocobalamin   1,000 mcg Intramuscular Daily   donepezil   10 mg Oral QHS   enoxaparin  (LOVENOX ) injection  40 mg Subcutaneous QHS   guaiFENesin   1,200 mg Oral BID   ipratropium-albuterol   3 mL Nebulization TID   lamoTRIgine   200 mg Oral BID   LORazepam   1 mg Oral BID   methylPREDNISolone  (SOLU-MEDROL ) injection  40 mg Intravenous Q12H   montelukast   10 mg Oral QHS   QUEtiapine   100 mg Oral Daily   QUEtiapine   150 mg Oral QHS   sodium chloride  flush  3 mL Intravenous Q12H   torsemide   60 mg Oral Daily   Continuous Infusions:  azithromycin  (ZITHROMAX ) 500 mg in sodium chloride   0.9 % 250 mL IVPB 500 mg (05/27/23 1319)   cefTRIAXone  (ROCEPHIN )  IV 2 g (05/27/23 1443)     LOS: 3 days    Time spent: 35 minutes.     Owen DELENA Lore, MD [facility]   If 7PM-7AM, please contact night-coverage [email or web address]  05/27/2023, 3:01 PM

## 2023-05-27 NOTE — Evaluation (Signed)
 Physical Therapy Evaluation Patient Details Name: Cindy Padilla MRN: [id number] DOB: 1940-09-14 Today's Date: 05/27/2023  History of Present Illness  82 year old with past medical history significant for COPD not on oxygen , dementia, anemia, bipolar 1 disorder, heart failure preserved ejection fraction moderate aortic stenosis and hypertension presents with altered mental status, hypoxic respiratory failure found to be positive for RSV.  Clinical Impression  Pt admitted with above diagnosis.  Pt currently with functional limitations due to the deficits listed below (see PT Problem List). Pt will benefit from acute skilled PT to increase their independence and safety with mobility to allow discharge.     The patient reports ambulatory with Rw  at the ALF. Patient requiring  mod assistance with m,olity  , trnasfers. Unable  to progress ambulation. Patient reports feeling fatigued. Patient should progress to return to [facility] and may require increased support with mobility and ADL's.     If plan is discharge home, recommend the following: Two people to help with walking and/or transfers;A lot of help with bathing/dressing/bathroom;Assist for transportation   Can travel by private vehicle        Equipment Recommendations None recommended by PT  Recommendations for Other Services       Functional Status Assessment Patient has had a recent decline in their functional status and demonstrates the ability to make significant improvements in function in a reasonable and predictable amount of time.     Precautions / Restrictions Precautions Precautions: Fall Restrictions Weight Bearing Restrictions Per Provider Order: No      Mobility  Bed Mobility Overal bed mobility: Needs Assistance Bed Mobility: Rolling, Sidelying to Sit Rolling: Mod assist Sidelying to sit: Max assist, HOB elevated       General bed mobility comments: max assist for legs and trunk, use of bed pad to slide to  edge    Transfers Overall transfer level: Needs assistance Equipment used: Rolling walker (2 wheels) Transfers: Sit to/from Stand, Bed to chair/wheelchair/BSC Sit to Stand: +2 safety/equipment, +2 physical assistance, Mod assist   Step pivot transfers: Mod assist, +2 physical assistance, +2 safety/equipment       General transfer comment: patient stands from bed  x 2 at Rw. patient able to step to  recliner    Ambulation/Gait                  Stairs            Wheelchair Mobility     Tilt Bed    Modified Rankin (Stroke Patients Only)       Balance Overall balance assessment: Needs assistance Sitting-balance support: Feet supported, Bilateral upper extremity supported Sitting balance-Leahy Scale: Fair     Standing balance support: During functional activity, Bilateral upper extremity supported, Reliant on assistive device for balance Standing balance-Leahy Scale: Poor                               Pertinent Vitals/Pain Pain Assessment Pain Assessment: Faces Faces Pain Scale: Hurts little more Pain Location: general, arthritis Pain Descriptors / Indicators: Aching Pain Intervention(s): Monitored during session    Home Living Family/patient expects to be discharged to:: Assisted living                 Home Equipment: Agricultural [employer]ant (2 wheels) Additional Comments: resides at [facility]    Prior Function Prior Level of Function : Needs assist  Mobility Comments: reports ambulates with Rw independently ADLs Comments: able to dress self     Extremity/Trunk Assessment   Upper Extremity Assessment Upper Extremity Assessment: Generalized weakness    Lower Extremity Assessment Lower Extremity Assessment: Generalized weakness    Cervical / Trunk Assessment Cervical / Trunk Assessment: Kyphotic  Communication      Cognition Arousal: Alert Behavior During Therapy: WFL for tasks  assessed/performed Overall Cognitive Status: Impaired/Different from baseline Area of Impairment: Orientation                               General Comments: to [facility], 2024, Dec, not date        General Comments      Exercises     Assessment/Plan    PT Assessment Patient needs continued PT services  PT Problem List Decreased strength;Decreased mobility;Decreased range of motion;Decreased activity tolerance;Decreased cognition;Decreased balance;Decreased knowledge of use of DME;Pain       PT Treatment Interventions DME instruction;Therapeutic activities;Therapeutic exercise;Patient/family education;Functional mobility training    PT Goals (Current goals can be found in the Care Plan section)  Acute Rehab PT Goals Patient Stated Goal: return to  [facility] PT Goal Formulation: With patient Time For Goal Achievement: 06/10/23 Potential to Achieve Goals: Fair    Frequency Min 1X/week     Co-evaluation               AM-PAC PT 6 Clicks Mobility  Outcome Measure Help needed turning from your back to your side while in a flat bed without using bedrails?: A Lot Help needed moving from lying on your back to sitting on the side of a flat bed without using bedrails?: A Lot Help needed moving to and from a bed to a chair (including a wheelchair)?: A Lot Help needed standing up from a chair using your arms (e.g., wheelchair or bedside chair)?: A Lot Help needed to walk in hospital room?: Total Help needed climbing 3-5 steps with a railing? : Total 6 Click Score: 10    End of Session Equipment Utilized During Treatment: Gait belt;Oxygen  Activity Tolerance: Patient limited by fatigue Patient left: in chair;with call bell/phone within reach;with chair alarm set Nurse Communication: Mobility status;Need for lift equipment PT Visit Diagnosis: Unsteadiness on feet (R26.81);Muscle weakness (generalized) (M62.81);Difficulty in walking, not elsewhere classified  (R26.2);Pain Pain - part of body: Leg    Time: 1001-1046 PT Time Calculation (min) (ACUTE ONLY): 45 min   Charges:   PT Evaluation $PT Eval Low Complexity: 1 Low PT Treatments $Therapeutic Activity: 8-22 mins $Self Care/Home Management: 8-22 PT General Charges $$ ACUTE PT VISIT: 1 Visit         Darice Potters PT Acute Rehabilitation Services Office [phone] Weekend [phone]   Potters Darice Norris 05/27/2023, 1:54 PM

## 2023-05-28 DIAGNOSIS — J205 Acute bronchitis due to respiratory syncytial virus: Secondary | ICD-10-CM | POA: Diagnosis not present

## 2023-05-28 DIAGNOSIS — G9341 Metabolic encephalopathy: Secondary | ICD-10-CM | POA: Insufficient documentation

## 2023-05-28 MED ORDER — IPRATROPIUM-ALBUTEROL 0.5-2.5 (3) MG/3ML IN SOLN: 3.0000 mL | Freq: Three times a day (TID) | RESPIRATORY_TRACT | 0 refills | Status: AC

## 2023-05-28 MED ORDER — GUAIFENESIN ER 600 MG PO TB12: 1200.0000 mg | ORAL_TABLET | Freq: Two times a day (BID) | ORAL | 0 refills | Status: AC

## 2023-05-28 MED ORDER — PREDNISONE 20 MG PO TABS: 40.0000 mg | ORAL_TABLET | Freq: Every day | ORAL | 0 refills | Status: AC

## 2023-05-28 MED ORDER — BENZONATATE 100 MG PO CAPS: 100.0000 mg | ORAL_CAPSULE | Freq: Three times a day (TID) | ORAL | 0 refills | Status: AC | PRN

## 2023-05-28 MED ORDER — CEFDINIR 300 MG PO CAPS: 300.0000 mg | ORAL_CAPSULE | Freq: Two times a day (BID) | ORAL | 0 refills | Status: AC

## 2023-05-28 MED ORDER — CYANOCOBALAMIN 500 MCG PO TABS: 500.0000 ug | ORAL_TABLET | Freq: Every day | ORAL | 0 refills | Status: AC

## 2023-05-28 NOTE — Progress Notes (Signed)
 2 attempts made to call report to the facility. No answer on either attempt and the voicemail was full. Will attempt once more when the patient is discharged.

## 2023-05-28 NOTE — [place] FL2 (Addendum)
   MEDICAID [id number] LEVEL OF CARE FORM     IDENTIFICATION  Patient Name: Cindy Padilla Birthdate: [birth year] Sex: female Admission Date (Current Location): 05/24/2023  [place] and [place] Number:  Lloyd [id number] Facility and Address:  [facility],  [address], [place] [address]      Provider Number: [id number]  Attending Physician Name and Address:  Madelyne Owen LABOR, MD  Relative Name and Phone Number:  Harlen Pulling Daughter [phone]  [phone]    Current Level of Care: Hospital Recommended Level of Care: Assisted Living Facility, Memory Care Prior Approval Number:    Date Approved/Denied:   PASRR Number:    Discharge Plan: Domiciliary (Rest home) [facility])    Current Diagnoses: Patient Active Problem List   Diagnosis Date Noted    Dementia with behavioral disturbance (HCC)    07/27/2013   Acute metabolic encephalopathy 05/28/2023   RSV bronchitis 05/24/2023   Melena    Heme positive stool    Acute GI bleeding 04/13/2020   Near syncope 10/15/2019   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   BRBPR (bright red blood per rectum) 04/28/2019   Acute exacerbation of chronic obstructive pulmonary disease (COPD) (HCC) 02/26/2017   COPD with acute exacerbation (HCC) 02/24/2017   Bipolar 1 disorder (HCC) 02/24/2017   Normocytic anemia 02/24/2017   Morbid obesity (HCC) 04/14/2015   Upper airway cough syndrome 04/14/2015   Ventral hernia 04/24/2014   Constipation 04/24/2014   Hemorrhoids 04/22/2014   Abnormality of gait 09/30/2013   Dyskinesia, 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  AMBULATORY STATUS COMMUNICATION OF NEEDS Skin   Limited Assist Verbally Normal                       Personal Care Assistance Level of Assistance  Bathing, Feeding, Dressing Bathing Assistance: Limited assistance Feeding assistance: Independent Dressing Assistance: Limited assistance     Functional Limitations Info  Sight, Speech, Hearing Sight Info: Adequate Hearing Info: Adequate Speech Info: Adequate    SPECIAL CARE FACTORS FREQUENCY                    Contractures Contractures Info: Not present    Additional Factors Info  Code Status, Allergies, Psychotropic Code Status Info: DNR Allergies Info: Sulfa Antibiotics  Iodine  Ms Contin  (Morphine )  Aminoglycosides  Aspirin  Codeine  Ivp Dye (Iodinated Contrast Media)  Neomycin  Penicillins  Phenothiazines  Promethazine  Tetracyclines & Related Psychotropic Info: LORazepam  (ATIVAN ) tablet 1 mg, QUEtiapine  (SEROQUEL ) tablet 100 mg, QUEtiapine  (SEROQUEL ) tablet 150 mg         Current Medications (05/28/2023):  This is the current hospital active medication list Current Facility-Administered Medications  Medication Dose Route Frequency Provider Last Rate Last Admin   acetaminophen  (TYLENOL ) tablet 650 mg  650 mg Oral Q6H PRN Alto Isaiah CROME, NP       Or   acetaminophen  (TYLENOL ) suppository 650  mg  650 mg Rectal Q6H PRN Alto Isaiah CROME, NP       albuterol  (PROVENTIL ) (2.5 MG/3ML) 0.083% nebulizer solution 2.5 mg  2.5 mg Nebulization Q2H PRN Regalado, Belkys A, MD       alum & mag hydroxide-simeth (MAALOX/MYLANTA) 200-200-20 MG/5ML suspension 30 mL  30 mL Oral Q4H PRN Regalado, Belkys A, MD   30 mL at 05/27/23 1346   amLODipine  (NORVASC ) tablet 5 mg  5 mg Oral Daily Alto Isaiah CROME, NP   5 mg at 05/28/23 9177    arformoterol  (BROVANA ) nebulizer solution 15 mcg  15 mcg Nebulization BID Regalado, Belkys A, MD   15 mcg at 05/27/23 1055   atorvastatin  (LIPITOR) tablet 20 mg  20 mg Oral QPM Alto Isaiah CROME, NP   20 mg at 05/27/23 1652   azithromycin  (ZITHROMAX ) 500 mg in sodium chloride  0.9 % 250 mL IVPB  500 mg Intravenous Q24H Regalado, Belkys A, MD 250 mL/hr at 05/27/23 1319 500 mg at 05/27/23 1319   benzonatate  (TESSALON ) capsule 100 mg  100 mg Oral TID PRN Regalado, Belkys A, MD   100 mg at 05/25/23 2118   budesonide  (PULMICORT ) nebulizer solution 0.25 mg  0.25 mg Nebulization BID Regalado, Belkys A, MD   0.25 mg at 05/27/23 1055   cefTRIAXone  (ROCEPHIN ) 2 g in sodium chloride  0.9 % 100 mL IVPB  2 g Intravenous Q24H Regalado, Belkys A, MD 200 mL/hr at 05/27/23 1443 2 g at 05/27/23 1443   clopidogrel  (PLAVIX ) tablet 75 mg  75 mg Oral Daily Alto Isaiah CROME, NP   75 mg at 05/28/23 9178   cyanocobalamin  (VITAMIN B12) injection 1,000 mcg  1,000 mcg Intramuscular Daily Regalado, Belkys A, MD   1,000 mcg at 05/28/23 9178   donepezil  (ARICEPT ) tablet 10 mg  10 mg Oral QHS Alto Isaiah CROME, NP   10 mg at 05/27/23 2106   enoxaparin  (LOVENOX ) injection 40 mg  40 mg Subcutaneous QHS Nicholaus Quarry, RPH   40 mg at 05/27/23 2106   guaiFENesin  (MUCINEX ) 12 hr tablet 1,200 mg  1,200 mg Oral BID Regalado, Belkys A, MD   1,200 mg at 05/28/23 9178   ipratropium-albuterol  (DUONEB) 0.5-2.5 (3) MG/3ML nebulizer solution 3 mL  3 mL Nebulization TID Regalado, Belkys A, MD   3 mL at 05/27/23 1055   lamoTRIgine  (LAMICTAL ) tablet 200 mg  200 mg Oral BID Alto Isaiah CROME, NP   200 mg at 05/28/23 9178   LORazepam  (ATIVAN ) tablet 1 mg  1 mg Oral BID Regalado, Belkys A, MD   1 mg at 05/28/23 9178   montelukast  (SINGULAIR ) tablet 10 mg  10 mg Oral QHS Regalado, Belkys A, MD   10 mg at 05/27/23 2106   ondansetron  (ZOFRAN ) tablet 4 mg  4 mg Oral Q6H PRN Alto Isaiah CROME, NP       Or   ondansetron  (ZOFRAN ) injection 4 mg  4 mg Intravenous  Q6H PRN Alto Isaiah CROME, NP       predniSONE  (DELTASONE ) tablet 40 mg  40 mg Oral Q breakfast Regalado, Belkys A, MD   40 mg at 05/28/23 9178   QUEtiapine  (SEROQUEL ) tablet 100 mg  100 mg Oral Daily Regalado, Belkys A, MD   100 mg at 05/28/23 9178   QUEtiapine  (SEROQUEL ) tablet 150 mg  150 mg Oral QHS Regalado, Belkys A, MD   150 mg at 05/27/23 2106   sodium chloride  flush (NS) 0.9 % injection 3 mL  3 mL Intravenous Q12H Alto,  Isaiah CROME, NP   3 mL at 05/28/23 9177   torsemide  (DEMADEX ) tablet 60 mg  60 mg Oral Daily Regalado, Belkys A, MD   60 mg at 05/28/23 9177     Discharge Medications: STOP taking these medications     baclofen  10 MG tablet Commonly known as: LIORESAL     guaifenesin  100 MG/5ML syrup Commonly known as: ROBITUSSIN Replaced by: guaiFENesin  600 MG 12 hr tablet    levofloxacin  500 MG tablet Commonly known as: LEVAQUIN     loperamide  2 MG tablet Commonly known as: IMODIUM  A-D           TAKE these medications     acetaminophen  325 MG tablet Commonly known as: TYLENOL  Take 650 mg by mouth 3 (three) times daily.    albuterol  108 (90 Base) MCG/ACT inhaler Commonly known as: VENTOLIN  HFA Inhale 2 puffs into the lungs every 4 (four) hours as needed for wheezing or shortness of breath.    amLODipine  5 MG tablet Commonly known as: NORVASC  Take 5 mg by mouth daily.    antiseptic oral rinse Liqd 10 mLs by Mouth Rinse route at bedtime.    atorvastatin  20 MG tablet Commonly known as: LIPITOR Take 20 mg by mouth every evening.    benzonatate  100 MG capsule Commonly known as: TESSALON  Take 1 capsule (100 mg total) by mouth 3 (three) times daily as needed for cough.    Breo Ellipta  200-25 MCG/ACT Aepb Generic drug: fluticasone  furoate-vilanterol Inhale 1 puff into the lungs daily.    cefdinir  300 MG capsule Commonly known as: OMNICEF  Take 1 capsule (300 mg total) by mouth 2 (two) times daily for 2 days.    clopidogrel  75 MG tablet Commonly known as:  PLAVIX  Take 1 tablet (75 mg total) by mouth daily.    cyanocobalamin  500 MCG tablet Commonly known as: VITAMIN B12 Take 1 tablet (500 mcg total) by mouth daily.    diclofenac Sodium 1 % Gel Commonly known as: VOLTAREN Apply 2 g topically 4 (four) times daily as needed (Pain).    donepezil  10 MG tablet Commonly known as: ARICEPT  Take 10 mg by mouth at bedtime.    ferrous sulfate  325 (65 FE) MG tablet Take 1 tablet (325 mg total) by mouth 2 (two) times daily with a meal. What changed: when to take this    fluticasone  50 MCG/ACT nasal spray Commonly known as: FLONASE  Place 2 sprays into both nostrils daily.    guaiFENesin  600 MG 12 hr tablet Commonly known as: MUCINEX  Take 2 tablets (1,200 mg total) by mouth 2 (two) times daily. Replaces: guaifenesin  100 MG/5ML syrup    hydrocortisone  2.5 % cream Apply 1 Application topically daily as needed (Irritation).    Incruse Ellipta  62.5 MCG/INH Aepb Generic drug: umeclidinium bromide  Inhale 1 puff into the lungs daily.    ipratropium-albuterol  0.5-2.5 (3) MG/3ML Soln Commonly known as: DUONEB Take 3 mLs by nebulization 3 (three) times daily. What changed:  when to take this reasons to take this    lamoTRIgine  200 MG tablet Commonly known as: LAMICTAL  Take 200 mg by mouth 2 (two) times daily.    LORazepam  1 MG tablet Commonly known as: ATIVAN  Take 1 tablet (1 mg total) by mouth 2 (two) times daily.    magnesium  oxide 400 (241.3 Mg) MG tablet Commonly known as: MAG-OX Take 400 mg by mouth daily.    montelukast  10 MG tablet Commonly known as: SINGULAIR  Take 10 mg by mouth at bedtime.    Nyamyc powder  Generic drug: nystatin Apply 1 Application topically 2 (two) times daily as needed (Rash).    polyethylene glycol 17 g packet Commonly known as: MIRALAX  / GLYCOLAX  Take 17 g by mouth 2 (two) times daily as needed for moderate constipation.    potassium chloride  SA 20 MEQ tablet Commonly known as: KLOR-CON  M Take 20  mEq by mouth daily.    predniSONE  20 MG tablet Commonly known as: DELTASONE  Take 2 tablets (40 mg total) by mouth daily with breakfast for 4 days. Start taking on: May 29, 2023    QUEtiapine  100 MG tablet Commonly known as: SEROQUEL  Take 100-150 mg by mouth See admin instructions. Take 100mg  (1 tablet) by mouth every morning, and 150mg  (1 and 1/2 tablet) every evening.    senna-docusate 8.6-50 MG tablet Commonly known as: Senokot-S Take 1 tablet by mouth 2 (two) times daily.    torsemide  20 MG tablet Commonly known as: DEMADEX  Take 3 tablets (60 mg total) by mouth daily. What changed: how much to take    Vitamin D  50 MCG (2000 UT) tablet Take 2,000 Units by mouth daily.      Relevant Imaging Results:  Relevant Lab Results:   Additional Information SSN [id number]  Dalila Camellia SAUNDERS, LCSW

## 2023-05-28 NOTE — TOC Progression Note (Addendum)
 Transition of Care [facility]) - Progression Note    Patient Details  Name: Cindy Padilla MRN: [id number] Date of Birth: 08-20-1940  [facility] CM/SW Contact  Dalila Camellia SAUNDERS, [place] Phone Number: 05/28/2023, 3:14 PM  Clinical Narrative:     CSW spoke to [facility] at 9:41am, and they confirmed patient can return today and will need a copy of the FL2 and DC summary prior to discharge, CSW was given fax number of [phone].  CSW asked if they had a preference for DME company, they did not, CSW contacted Jermaine at [facility], and he can provide the oxygen  for the patient.   CSW attempted to fax requested documents to [phone] per facility request at the following times:  1002, 1031, 1105, 1133, 1159, 1250, 1320, 1350, 1403, and 1411.   Each time the fax would not go through, CSW tried different fax machines it would still not go through.   CSW tried calling [facility] [phone] at the following times:  1010, 1030, 1120, 1200, 1205, 1206, 1207, 1209, 1210, 1238, 1247, and 1330, 1355 to let them know the fax is not going through, but it would ring 4 times, then disconnect the call each time.  Not able to leave a message.  1332 CSW was informed by Jermaine at [facility] that oxygen  and nebulizer has been delivered.  CSW tried calling [address] again at 1357, and spoke to [employer], and told her fax is not going through, CSW verified the fax number that was given, and she said that is the correct number.  Per Kermit, she said patient is not able to return today, because of the holiday.    CSW explained that CSW has been trying to get a hold of the facility since early this morning, and the fax has not been able to go through.     She then spoke to her supervisor, and gave CSW the fax number for [phone], to send the paperwork to.  CSW faxed requested information to [phone] at 1425, and confirmation that it has been received at 1436.  2:41pm CSW  received call back from [place] at [facility] she said the manager is requesting the FL2 to be changed to document the oxygen , patient's diet, and dementia diagnosis.  CSW made requested changes to the [facility] and asked MD to sign again.    CSW then faxed the new FL2 to [phone], at 1505, confirmation received that it was accepted at 1517.  CSW awaiting for call back from [place] to verify everything is correct and patient can return today.  CSW updated attending physician and bedside nurse.  CSW attempted to call [facility] again at 1546, 1547, 1549, and 1550, it rings 4x then disconnects.    CSW updated attending physician, bedside nurse, charge nurse, and patient's daughter.    Potential discharge back to memory care ALF tomorrow since [facility] is not answering phone call.  Patient will need EMS transport back to facility with oxygen .  Oxygen  and nebulizer has been delivered to ALF by [employer].  CSW has not heard back again from [facility] facility, [facility] to continue to facilitate discharge planning.   Expected Discharge Plan: Assisted Living Barriers to Discharge: Barriers Resolved  Expected Discharge Plan and Services     Post Acute Care Choice: Home Health Living arrangements for the past 2 months: Assisted Living Facility Expected Discharge Date: 05/28/23  HH Arranged: PT, OT           Social Determinants of Health (SDOH) Interventions SDOH Screenings   Food Insecurity: No Food Insecurity (05/24/2023)  Housing: Low Risk  (05/24/2023)  Transportation Needs: No Transportation Needs (05/24/2023)  Utilities: Not At Risk (05/24/2023)  Social Connections: Moderately Integrated (05/27/2023)  Tobacco Use: Medium Risk (05/24/2023)    Readmission Risk Interventions     No data to display

## 2023-05-28 NOTE — Discharge Instructions (Signed)
Follow-up with cardiology outpatient

## 2023-05-28 NOTE — Discharge Summary (Signed)
 Physician Discharge Summary   Patient: Cindy Padilla MRN: [id number] DOB: [birth year]  Admit date:     05/24/2023  Discharge date: 05/28/23  Discharge Physician: Owen DELENA Lore   PCP: Ginny Bale, FNP   Recommendations at discharge:    Needs follow up for resolution RSV Needs to follow up with cardiology for further evaluation of Aortic Stenosis.   Discharge Diagnoses: Principal Problem:  Acute Hypoxic Respiratory Failure in the setting of RSV bronchitis with     superimposed Bacterial Pneumonia:, Acute COPD    RSV bronchitis Active Problems:   Hyponatremia   HTN (hypertension)   Dementia with behavioral disturbance (HCC)   Bipolar 1 disorder (HCC)   Acute exacerbation of chronic obstructive pulmonary disease (COPD) (HCC)  Resolved Problems:   * No resolved hospital problems. *  Hospital Course: 83 year old with past medical history significant for COPD not on oxygen , dementia, anemia, bipolar 1 disorder, heart failure preserved ejection fraction moderate aortic stenosis and hypertension presents with altered mental status, hypoxic respiratory failure found to be positive for RSV.    Assessment and Plan: No notes have been filed under this hospital service. Service: Hospitalist Acute  Hypoxic Respiratory Failure in the setting of RSV bronchitis with superimposed Bacterial Pneumonia:, Acute COPD -Patient presented with worsening shortness of breath, hypoxia, chest x-ray diffuse interstitial prominence and peribronchial cuffing concerning for acute bronchitis -Patient was found to be RSV positive -Continue with supportive care. -Continue with DuoNeb, Pulmicort , Brovana  -Continue with guaifenesin , Tessalon .  -Continue 3 L oxygen  supplementation -Received  IV ceftriaxone  and azithromycin  day 5/5 Continue with current managements.  Stable to be transfer to her facility.  Change Solumedrol to prednisone .  Discharge on Cefdinir  BID for 2 more days.   Acute  metabolic encephalopathy -In the setting of viral illness.  -Resume Home dose Seroquel .  -Resume home dose Ativan  twice daily schedule.  Improved.    Chronic heart failure preserved ejection fraction: Aortic stenosis -Resume torsemide  -Received IV Lasix  12/28 -Follow echo: severe aortic stenosis. Needs to follow up with cardiology -Negative 3 L Monitor.   Referral to Out patient cardiology made  Mild renal insufficiency: -Present  with a creatinine 1.4, -Improved.  Monitor renal function   Hypertension: -Continue with  Norvasc , torsemide    Hyperlipidemia: -Continue statins   History of bipolar disorder Resume Seroquel  and Ativan    Estimated body mass index is 30.87 kg/m as calculated from the following:   Height as of this encounter: 5' (1.524 m).   Weight as of this encounter: 71.7 kg.         Consultants: None Procedures performed: ECHO Disposition: Skilled nursing facility Diet recommendation:  Discharge Diet Orders (From admission, onward)     Start     Ordered   05/28/23 0000  Diet - low sodium heart healthy        05/28/23 0923           Cardiac diet DISCHARGE MEDICATION: Allergies as of 05/28/2023       Reactions   Sulfa Antibiotics Other (See Comments)   Unknown reaction Listed as an allergy on MAR Other Reaction(s): Other (See Comments), Unknown    Unknown reaction Listed as an allergy on MAR   Iodine Other (See Comments)   Unknown reaction Listed as an allergy on MAR   Ms Contin  [morphine ] Other (See Comments)   Unknown reaction Listed as an allergy on MAR   Aminoglycosides Other (See Comments)   Unknown reaction  Listed as an allergy on [facility]  Aspirin Other (See Comments)   Unknown reaction  Listed as an allergy on MAR   Codeine Other (See Comments)   Unknown reaction Listed as an allergy on MAR   Ivp Dye [iodinated Contrast Media] Other (See Comments)   Unknown reaction Listed as an allergy on MAR   Neomycin Other (See  Comments)   Unknown reaction Listed as an allergy on MAR   Penicillins Rash, Other (See Comments)   Not listed on MAR Has patient had a PCN reaction causing immediate rash, facial/tongue/throat swelling, SOB or lightheadedness with hypotension: No Has patient had a PCN reaction causing severe rash involving mucus membranes or skin necrosis: No Has patient had a PCN reaction that required hospitalization No Has patient had a PCN reaction occurring within the last 10 years: No If all of the above answers are NO, then may proceed with Cephalosporin use.   Phenothiazines Other (See Comments)   Unknown reaction Listed as an allergy on MAR   Promethazine Other (See Comments)   Unknown reaction Not listed on MAR   Tetracyclines & Related Other (See Comments)   Unknown reaction Listed as an allergy on MAR        Medication List     STOP taking these medications    baclofen  10 MG tablet Commonly known as: LIORESAL    guaifenesin  100 MG/5ML syrup Commonly known as: ROBITUSSIN Replaced by: guaiFENesin  600 MG 12 hr tablet   levofloxacin  500 MG tablet Commonly known as: LEVAQUIN    loperamide  2 MG tablet Commonly known as: IMODIUM  A-D       TAKE these medications    acetaminophen  325 MG tablet Commonly known as: TYLENOL  Take 650 mg by mouth 3 (three) times daily.   albuterol  108 (90 Base) MCG/ACT inhaler Commonly known as: VENTOLIN  HFA Inhale 2 puffs into the lungs every 4 (four) hours as needed for wheezing or shortness of breath.   amLODipine  5 MG tablet Commonly known as: NORVASC  Take 5 mg by mouth daily.   antiseptic oral rinse Liqd 10 mLs by Mouth Rinse route at bedtime.   atorvastatin  20 MG tablet Commonly known as: LIPITOR Take 20 mg by mouth every evening.   benzonatate  100 MG capsule Commonly known as: TESSALON  Take 1 capsule (100 mg total) by mouth 3 (three) times daily as needed for cough.   Breo Ellipta  200-25 MCG/ACT Aepb Generic drug: fluticasone   furoate-vilanterol Inhale 1 puff into the lungs daily.   cefdinir  300 MG capsule Commonly known as: OMNICEF  Take 1 capsule (300 mg total) by mouth 2 (two) times daily for 2 days.   clopidogrel  75 MG tablet Commonly known as: PLAVIX  Take 1 tablet (75 mg total) by mouth daily.   cyanocobalamin  500 MCG tablet Commonly known as: VITAMIN B12 Take 1 tablet (500 mcg total) by mouth daily.   diclofenac Sodium 1 % Gel Commonly known as: VOLTAREN Apply 2 g topically 4 (four) times daily as needed (Pain).   donepezil  10 MG tablet Commonly known as: ARICEPT  Take 10 mg by mouth at bedtime.   ferrous sulfate  325 (65 FE) MG tablet Take 1 tablet (325 mg total) by mouth 2 (two) times daily with a meal. What changed: when to take this   fluticasone  50 MCG/ACT nasal spray Commonly known as: FLONASE  Place 2 sprays into both nostrils daily.   guaiFENesin  600 MG 12 hr tablet Commonly known as: MUCINEX  Take 2 tablets (1,200 mg total) by mouth 2 (two) times daily. Replaces: guaifenesin  100 MG/5ML syrup  hydrocortisone  2.5 % cream Apply 1 Application topically daily as needed (Irritation).   Incruse Ellipta  62.5 MCG/INH Aepb Generic drug: umeclidinium bromide  Inhale 1 puff into the lungs daily.   ipratropium-albuterol  0.5-2.5 (3) MG/3ML Soln Commonly known as: DUONEB Take 3 mLs by nebulization 3 (three) times daily. What changed:  when to take this reasons to take this   lamoTRIgine  200 MG tablet Commonly known as: LAMICTAL  Take 200 mg by mouth 2 (two) times daily.   LORazepam  1 MG tablet Commonly known as: ATIVAN  Take 1 tablet (1 mg total) by mouth 2 (two) times daily.   magnesium  oxide 400 (241.3 Mg) MG tablet Commonly known as: MAG-OX Take 400 mg by mouth daily.   montelukast  10 MG tablet Commonly known as: SINGULAIR  Take 10 mg by mouth at bedtime.   Nyamyc powder Generic drug: nystatin Apply 1 Application topically 2 (two) times daily as needed (Rash).   polyethylene  glycol 17 g packet Commonly known as: MIRALAX  / GLYCOLAX  Take 17 g by mouth 2 (two) times daily as needed for moderate constipation.   potassium chloride  SA 20 MEQ tablet Commonly known as: KLOR-CON  M Take 20 mEq by mouth daily.   predniSONE  20 MG tablet Commonly known as: DELTASONE  Take 2 tablets (40 mg total) by mouth daily with breakfast for 4 days. Start taking on: May 29, 2023   QUEtiapine  100 MG tablet Commonly known as: SEROQUEL  Take 100-150 mg by mouth See admin instructions. Take 100mg  (1 tablet) by mouth every morning, and 150mg  (1 and 1/2 tablet) every evening.   senna-docusate 8.6-50 MG tablet Commonly known as: Senokot-S Take 1 tablet by mouth 2 (two) times daily.   torsemide  20 MG tablet Commonly known as: DEMADEX  Take 3 tablets (60 mg total) by mouth daily. What changed: how much to take   Vitamin D  50 MCG (2000 UT) tablet Take 2,000 Units by mouth daily.        Discharge Exam: Filed Weights   05/24/23 1116 05/25/23 0407 05/28/23 0600  Weight: 73.2 kg 71.7 kg 70.3 kg   General; NAD Lung Bl ronchus, no wheezing  Condition at discharge: stable  The results of significant diagnostics from this hospitalization (including imaging, microbiology, ancillary and laboratory) are listed below for reference.   Imaging Studies: DG Chest 2 View Result Date: 05/26/2023 CLINICAL DATA:  Pleural effusion EXAM: CHEST - 2 VIEW COMPARISON:  05/24/2023 FINDINGS: Frontal and lateral views of the chest demonstrate stable nerve stimulator within the left anterior chest. The cardiac silhouette is stable. Central pulmonary vascular congestion, with progressive diffuse interstitial prominence. This could reflect interstitial edema or bronchitis. No lobar consolidation, effusion, or pneumothorax. IMPRESSION: 1. Diffuse interstitial prominence, which may reflect bronchitis or developing interstitial edema. Electronically Signed   By: Ozell Daring M.D.   On: 05/26/2023 19:29    ECHOCARDIOGRAM COMPLETE Result Date: 05/25/2023    ECHOCARDIOGRAM REPORT   Patient Name:   Cindy Padilla Date of Exam: 05/25/2023 Medical Rec #:  [id number]             Height:       60.0 in Accession #:    [id number]            Weight:       158.1 lb Date of Birth:  Jun 30, 1940             BSA:          1.689 m Patient Age:    57 years  BP:           165/96 mmHg Patient Gender: F                     HR:           87 bpm. Exam Location:  Inpatient Procedure: 2D Echo, Cardiac Doppler and Color Doppler Indications:    I35.0 Nonrheumatic aortic (valve) stenosis  History:        Patient has prior history of Echocardiogram examinations, most                 recent 10/16/2019. CHF, COPD, Signs/Symptoms:Altered Mental                 Status, Alzheimer's, Shortness of Breath and Dyspnea; Risk                 Factors:Hypertension. RSV positive.  Sonographer:    Ellouise Mose RDCS Referring Phys: [id number] ISAIAH LITTIE LEVER  Sonographer Comments: Technically difficult study due to poor echo windows. Image acquisition challenging due to patient body habitus and Image acquisition challenging due to uncooperative patient. Patient had inspirometer in mouth and would not take it out. Patient talking throughout exam. IMPRESSIONS  1. Left ventricular ejection fraction, by estimation, is 65 to 70%. The left ventricle has normal function. The left ventricle has no regional wall motion abnormalities. There is mild left ventricular hypertrophy. Left ventricular diastolic parameters are consistent with Grade I diastolic dysfunction (impaired relaxation).  2. Right ventricular systolic function is normal. The right ventricular size is normal. The estimated right ventricular systolic pressure is 25.3 mmHg.  3. Large pleural effusion.  4. The mitral valve is degenerative. Trivial mitral valve regurgitation. Mild mitral stenosis. The mean mitral valve gradient is 7.0 mmHg with average heart rate of 85 bpm.  5. The aortic valve is  tricuspid. Aortic valve regurgitation is not visualized. Severe aortic valve stenosis. Aortic valve area, by VTI measures 0.74 cm. Aortic valve mean gradient measures 47.0 mmHg. Aortic valve Vmax measures 3.89 m/s. Peak gradient 60.6 mmHg, DI 0.33.  6. The inferior vena cava is normal in size with <50% respiratory variability, suggesting right atrial pressure of 8 mmHg. Comparison(s): Changes from prior study are noted. 10/16/2019: LVEF 65-70%, moderate AS- MG 35 mmHg, DI 0.30. FINDINGS  Left Ventricle: Left ventricular ejection fraction, by estimation, is 65 to 70%. The left ventricle has normal function. The left ventricle has no regional wall motion abnormalities. The left ventricular internal cavity size was normal in size. There is  mild left ventricular hypertrophy. Left ventricular diastolic parameters are consistent with Grade I diastolic dysfunction (impaired relaxation). Right Ventricle: The right ventricular size is normal. No increase in right ventricular wall thickness. Right ventricular systolic function is normal. The tricuspid regurgitant velocity is 2.08 m/s, and with an assumed right atrial pressure of 8 mmHg, the estimated right ventricular systolic pressure is 25.3 mmHg. Left Atrium: Left atrial size was normal in size. Right Atrium: Right atrial size was normal in size. Pericardium: There is no evidence of pericardial effusion. Mitral Valve: The mitral valve is degenerative in appearance. Trivial mitral valve regurgitation. Mild mitral valve stenosis. MV peak gradient, 12.0 mmHg. The mean mitral valve gradient is 7.0 mmHg with average heart rate of 85 bpm. Tricuspid Valve: The tricuspid valve is normal in structure. Tricuspid valve regurgitation is mild . No evidence of tricuspid stenosis. Aortic Valve: The aortic valve is tricuspid. Aortic valve regurgitation is not visualized. Severe aortic stenosis  is present. Aortic valve mean gradient measures 47.0 mmHg. Aortic valve peak gradient measures  60.6 mmHg. Aortic valve area, by VTI measures  0.74 cm. Pulmonic Valve: The pulmonic valve was normal in structure. Pulmonic valve regurgitation is not visualized. No evidence of pulmonic stenosis. Aorta: The aortic root and ascending aorta are structurally normal, with no evidence of dilitation. Venous: The inferior vena cava is normal in size with less than 50% respiratory variability, suggesting right atrial pressure of 8 mmHg. IAS/Shunts: No atrial level shunt detected by color flow Doppler. Additional Comments: There is a large pleural effusion.  LEFT VENTRICLE PLAX 2D LVIDd:         3.90 cm     Diastology LVIDs:         2.20 cm     LV e' medial:    4.68 cm/s LV PW:         1.10 cm     LV E/e' medial:  20.1 LV IVS:        1.00 cm     LV e' lateral:   8.05 cm/s LVOT diam:     1.70 cm     LV E/e' lateral: 11.7 LV SV:         63 LV SV Index:   37 LVOT Area:     2.27 cm  LV Volumes (MOD) LV vol d, MOD A2C: 85.1 ml LV vol d, MOD A4C: 64.6 ml LV vol s, MOD A2C: 35.4 ml LV vol s, MOD A4C: 19.3 ml LV SV MOD A2C:     49.7 ml LV SV MOD A4C:     64.6 ml LV SV MOD BP:      46.2 ml RIGHT VENTRICLE             IVC RV S prime:     15.00 cm/s  IVC diam: 1.90 cm TAPSE (M-mode): 2.3 cm LEFT ATRIUM             Index        RIGHT ATRIUM          Index LA diam:        3.70 cm 2.19 cm/m   RA Area:     8.35 cm LA Vol (A2C):   34.9 ml 20.66 ml/m  RA Volume:   13.80 ml 8.17 ml/m LA Vol (A4C):   27.0 ml 15.99 ml/m LA Biplane Vol: 33.9 ml 20.07 ml/m  AORTIC VALVE AV Area (Vmax):    0.85 cm AV Area (Vmean):   0.79 cm AV Area (VTI):     0.74 cm AV Vmax:           389.20 cm/s AV Vmean:          283.800 cm/s AV VTI:            0.850 m AV Peak Grad:      60.6 mmHg AV Mean Grad:      47.0 mmHg LVOT Vmax:         145.00 cm/s LVOT Vmean:        99.300 cm/s LVOT VTI:          0.278 m LVOT/AV VTI ratio: 0.33  AORTA Ao Root diam: 2.60 cm Ao Asc diam:  3.40 cm MITRAL VALVE                TRICUSPID VALVE MV Area (PHT): 4.34 cm     TR Peak  grad:   17.3 mmHg MV Peak grad:  12.0 mmHg  TR Vmax:        208.00 cm/s MV Mean grad:  7.0 mmHg MV Vmax:       1.73 m/s     SHUNTS MV Vmean:      130.0 cm/s   Systemic VTI:  0.28 m MV Decel Time: 175 msec     Systemic Diam: 1.70 cm MV E velocity: 93.90 cm/s MV A velocity: 122.33 cm/s MV E/A ratio:  0.77 Vinie Maxcy MD Electronically signed by Vinie Maxcy MD Signature Date/Time: 05/25/2023/11:53:47 AM    Final    CT HEAD WO CONTRAST ( ) Result Date: 05/24/2023 CLINICAL DATA:  Altered mentation, dementia EXAM: CT HEAD WITHOUT CONTRAST TECHNIQUE: Contiguous axial images were obtained from the base of the skull through the vertex without intravenous contrast. RADIATION DOSE REDUCTION: This exam was performed according to the departmental dose-optimization program which includes automated exposure control, adjustment of the mA and/or kV according to patient size and/or use of iterative reconstruction technique. COMPARISON:  05/22/2023 CT head FINDINGS: Brain: No evidence of acute infarction, hemorrhage, mass, mass effect, or midline shift. No hydrocephalus or extra-axial fluid collection. Periventricular white matter changes, likely the sequela of chronic small vessel ischemic disease. Cerebral volume is within normal limits for age. Vascular: No hyperdense vessel. Skull: Negative for fracture or focal lesion. Sinuses/Orbits: Mucosal thickening throughout the paranasal sinuses, most prominent in the ethmoid air cells. Status post bilateral lens replacements. Other: The mastoid air cells are well aerated. IMPRESSION: No acute intracranial process. Electronically Signed   By: Donald Campion M.D.   On: 05/24/2023 21:40   DG Chest 2 View Result Date: 05/24/2023 CLINICAL DATA:  83 year old female with history of shortness of breath. EXAM: CHEST - 2 VIEW COMPARISON:  Chest x-ray 05/22/2023. FINDINGS: Lung volumes are low. Elevation of the right hemidiaphragm again noted. No confluent consolidative airspace  disease. No pleural effusions. Diffuse interstitial prominence and widespread peribronchial cuffing. Heart size is mildly enlarged (unchanged). Upper mediastinal contours are within normal limits. Atherosclerotic calcifications are noted in the thoracic aorta. Electronic device projecting over the left hemithorax with lead extending into the left cervical region, presumably a vagal nerve stimulator. IMPRESSION: 1. Diffuse interstitial prominence and peribronchial cuffing concerning for an acute bronchitis. 2. Aortic atherosclerosis. 3. Mild cardiomegaly. 4. Low lung volumes. Electronically Signed   By: Toribio Aye M.D.   On: 05/24/2023 12:31   CT Head Wo Contrast Result Date: 05/22/2023 CLINICAL DATA:  Unwitnessed fall.  Patient hit the back of the head. EXAM: CT HEAD WITHOUT CONTRAST CT CERVICAL SPINE WITHOUT CONTRAST TECHNIQUE: Multidetector CT imaging of the head and cervical spine was performed following the standard protocol without intravenous contrast. Multiplanar CT image reconstructions of the cervical spine were also generated. RADIATION DOSE REDUCTION: This exam was performed according to the departmental dose-optimization program which includes automated exposure control, adjustment of the mA and/or kV according to patient size and/or use of iterative reconstruction technique. COMPARISON:  CT scan head and cervical spine from 02/22/2022. FINDINGS: CT HEAD FINDINGS Brain: No evidence of acute infarction, hemorrhage, hydrocephalus, extra-axial collection or mass lesion/mass effect. There is bilateral periventricular hypodensity, which is non-specific but most likely seen in the settings of microvascular ischemic changes. Moderate in extent. Otherwise normal appearance of brain parenchyma. Ventricles are normal. Cerebral volume is age appropriate. Vascular: No hyperdense vessel or unexpected calcification. Intracranial arteriosclerosis. Skull: Normal. Negative for fracture or focal lesion.  Sinuses/Orbits: No acute finding. Mild-to-moderate mucoperiosteal thickening noted in the bilateral ethmoidal air cells. Other: Visualized  mastoid air cells are unremarkable. No mastoid effusion. CT CERVICAL SPINE FINDINGS Alignment: There is loss of cervical lordosis. There is grade 1 anterolisthesis of C3 over C4 and grade 1 retrolisthesis of C5 over C6, likely degenerative. This examination does not assess for ligamentous injury or stability. Skull base and vertebrae: No acute fracture. No primary bone lesion or focal pathologic process. Soft tissues and spinal canal: No prevertebral fluid or swelling. No visible canal hematoma. Disc levels: Moderate multilevel reduced intervertebral disc height, most pronounced at C4-5 through C6-7 levels. There is mild multilevel facet arthropathy and marginal osteophyte formation. Upper chest: Negative. Other: None. IMPRESSION: *No acute intracranial abnormality. *No acute osseous injury or traumatic listhesis of the cervical spine. Electronically Signed   By: Ree Molt M.D.   On: 05/22/2023 13:24   CT Cervical Spine Wo Contrast Result Date: 05/22/2023 CLINICAL DATA:  Unwitnessed fall.  Patient hit the back of the head. EXAM: CT HEAD WITHOUT CONTRAST CT CERVICAL SPINE WITHOUT CONTRAST TECHNIQUE: Multidetector CT imaging of the head and cervical spine was performed following the standard protocol without intravenous contrast. Multiplanar CT image reconstructions of the cervical spine were also generated. RADIATION DOSE REDUCTION: This exam was performed according to the departmental dose-optimization program which includes automated exposure control, adjustment of the mA and/or kV according to patient size and/or use of iterative reconstruction technique. COMPARISON:  CT scan head and cervical spine from 02/22/2022. FINDINGS: CT HEAD FINDINGS Brain: No evidence of acute infarction, hemorrhage, hydrocephalus, extra-axial collection or mass lesion/mass effect. There is  bilateral periventricular hypodensity, which is non-specific but most likely seen in the settings of microvascular ischemic changes. Moderate in extent. Otherwise normal appearance of brain parenchyma. Ventricles are normal. Cerebral volume is age appropriate. Vascular: No hyperdense vessel or unexpected calcification. Intracranial arteriosclerosis. Skull: Normal. Negative for fracture or focal lesion. Sinuses/Orbits: No acute finding. Mild-to-moderate mucoperiosteal thickening noted in the bilateral ethmoidal air cells. Other: Visualized mastoid air cells are unremarkable. No mastoid effusion. CT CERVICAL SPINE FINDINGS Alignment: There is loss of cervical lordosis. There is grade 1 anterolisthesis of C3 over C4 and grade 1 retrolisthesis of C5 over C6, likely degenerative. This examination does not assess for ligamentous injury or stability. Skull base and vertebrae: No acute fracture. No primary bone lesion or focal pathologic process. Soft tissues and spinal canal: No prevertebral fluid or swelling. No visible canal hematoma. Disc levels: Moderate multilevel reduced intervertebral disc height, most pronounced at C4-5 through C6-7 levels. There is mild multilevel facet arthropathy and marginal osteophyte formation. Upper chest: Negative. Other: None. IMPRESSION: *No acute intracranial abnormality. *No acute osseous injury or traumatic listhesis of the cervical spine. Electronically Signed   By: Ree Molt M.D.   On: 05/22/2023 13:24   DG Chest Portable 1 View Result Date: 05/22/2023 CLINICAL DATA:  Unwitnessed fall.  Cough. EXAM: PORTABLE CHEST 1 VIEW COMPARISON:  02/22/2022. FINDINGS: Low lung volume. Redemonstration of elevated right hemidiaphragm. Bilateral lung fields are clear. Bilateral costophrenic angles are clear. Stable cardio-mediastinal silhouette. Neurostimulator device again seen with its battery pack overlying the left paramedian chest. No acute osseous abnormalities. The soft tissues are  within normal limits. IMPRESSION: No active disease. Electronically Signed   By: Ree Molt M.D.   On: 05/22/2023 13:16   DG Pelvis Portable Result Date: 05/22/2023 CLINICAL DATA:  Unwitnessed fall. EXAM: PORTABLE PELVIS 1-2 VIEWS COMPARISON:  02/22/2022. FINDINGS: Pelvis is intact with normal and symmetric sacroiliac joints. No acute fracture or dislocation. No aggressive osseous lesion. Visualized  sacral arcuate lines are unremarkable. There are changes of chronic pubic symphisitis. There are mild degenerative changes of bilateral hip joints with mild joint space narrowing and osteophytosis of the superior acetabulum. No radiopaque foreign bodies. IMPRESSION: *No acute osseous abnormality of the pelvis. Electronically Signed   By: Ree Molt M.D.   On: 05/22/2023 13:14    Microbiology: Results for orders placed or performed during the hospital encounter of 05/24/23  Resp panel by RT-PCR (RSV, Flu A&B, Covid) Anterior Nasal Swab     Status: Abnormal   Collection Time: 05/24/23 11:36 AM   Specimen: Anterior Nasal Swab  Result Value Ref Range Status   SARS Coronavirus 2 by RT PCR NEGATIVE NEGATIVE Final    Comment: (NOTE) SARS-CoV-2 target nucleic acids are NOT DETECTED.  The SARS-CoV-2 RNA is generally detectable in upper respiratory specimens during the acute phase of infection. The lowest concentration of SARS-CoV-2 viral copies this assay can detect is 138 copies/mL. A negative result does not preclude SARS-Cov-2 infection and should not be used as the sole basis for treatment or other patient management decisions. A negative result may occur with  improper specimen collection/handling, submission of specimen other than nasopharyngeal swab, presence of viral mutation(s) within the areas targeted by this assay, and inadequate number of viral copies(<138 copies/mL). A negative result must be combined with clinical observations, patient history, and epidemiological information.  The expected result is Negative.  Fact Sheet for Patients:  bloggercourse.com  Fact Sheet for Healthcare Providers:  seriousbroker.it  This test is no t yet approved or cleared by the [place]  FDA and  has been authorized for detection and/or diagnosis of SARS-CoV-2 by FDA under an Emergency Use Authorization (EUA). This EUA will remain  in effect (meaning this test can be used) for the duration of the COVID-19 declaration under Section 564(b)(1) of the Act, 21 U.S.C.section 360bbb-3(b)(1), unless the authorization is terminated  or revoked sooner.       Influenza A by PCR NEGATIVE NEGATIVE Final   Influenza B by PCR NEGATIVE NEGATIVE Final    Comment: (NOTE) The Xpert Xpress SARS-CoV-2/FLU/RSV plus assay is intended as an aid in the diagnosis of influenza from Nasopharyngeal swab specimens and should not be used as a sole basis for treatment. Nasal washings and aspirates are unacceptable for Xpert Xpress SARS-CoV-2/FLU/RSV testing.  Fact Sheet for Patients: bloggercourse.com  Fact Sheet for Healthcare Providers: seriousbroker.it  This test is not yet approved or cleared by the [place]  FDA and has been authorized for detection and/or diagnosis of SARS-CoV-2 by FDA under an Emergency Use Authorization (EUA). This EUA will remain in effect (meaning this test can be used) for the duration of the COVID-19 declaration under Section 564(b)(1) of the Act, 21 U.S.C. section 360bbb-3(b)(1), unless the authorization is terminated or revoked.     Resp Syncytial Virus by PCR POSITIVE (A) NEGATIVE Final    Comment: (NOTE) Fact Sheet for Patients: bloggercourse.com  Fact Sheet for Healthcare Providers: seriousbroker.it  This test is not yet approved or cleared by the [place]  FDA and has been authorized for detection  and/or diagnosis of SARS-CoV-2 by FDA under an Emergency Use Authorization (EUA). This EUA will remain in effect (meaning this test can be used) for the duration of the COVID-19 declaration under Section 564(b)(1) of the Act, 21 U.S.C. section 360bbb-3(b)(1), unless the authorization is terminated or revoked.  Performed at [facility], [address] [place], [place] [address]   Urine Culture (for pregnant,  neutropenic or urologic patients or patients with an indwelling urinary catheter)     Status: None   Collection Time: 05/24/23  7:30 PM   Specimen: In/Out Cath Urine  Result Value Ref Range Status   Specimen Description   Final    IN/OUT CATH URINE Performed at [facility], [address], [place], [place] [address]    Special Requests   Final    NONE Performed at [facility], [address], [place], [place] [address]    Culture   Final    NO GROWTH Performed at [facility], [address] [place], [place] [address]    Report Status 05/26/2023 FINAL  Final    Labs: CBC: Recent Labs  Lab 05/22/23 1203 05/24/23 1114 05/24/23 1556 05/25/23 0506 05/25/23 0835 05/26/23 0447  WBC 9.2 9.9 6.9 5.9 10.5 9.6  NEUTROABS 6.3 6.3  --   --   --   --   HGB 11.4* 11.5* 10.9* 16.0* 11.4* 11.2*  HCT 33.4* 35.6* 33.8* 47.0* 35.8* 34.2*  MCV 92.8 92.0 94.2 89.9 93.0 93.2  PLT 203 212 213 102* 235 249   Basic Metabolic Panel: Recent Labs  Lab 05/22/23 1203 05/24/23 1114 05/24/23 1430 05/25/23 0506 05/26/23 0447  NA 131* 135  --  138 137  K 3.2* 3.5  --  3.5 3.6  CL 94* 97*  --  100 100  CO2 24 25  --  27 25  GLUCOSE 104* 128*  --  141* 152*  BUN 20 25*  --  26* 30*  CREATININE 1.18* 1.40* 1.21* 0.89 0.83  CALCIUM  9.0 9.2  --  9.1 9.1   Liver Function Tests: Recent Labs  Lab 05/24/23 1114  AST 28  ALT 19  ALKPHOS 93  BILITOT 0.7  PROT 7.4  ALBUMIN 3.6   CBG: Recent Labs  Lab 05/24/23 1453   GLUCAP 179*    Discharge time spent: greater than 30 minutes.  Signed: Owen DELENA Lore, MD [facility] 05/28/2023

## 2023-05-28 NOTE — Progress Notes (Signed)
 SATURATION QUALIFICATIONS: (This note is used to comply with regulatory documentation for home oxygen )  Patient Saturations on Room Air at Rest = 82%  Patient Saturations on Room Air while Ambulating = 82%  Patient Saturations on 2 Liters of oxygen  while Ambulating = 94%  Please briefly explain why patient needs home oxygen : Patient unable to maintain oxygen  saturation at 88% or higher on room air while at rest and ambulating.

## 2023-05-29 DIAGNOSIS — J205 Acute bronchitis due to respiratory syncytial virus: Secondary | ICD-10-CM | POA: Diagnosis not present

## 2023-05-29 MED ORDER — CEFDINIR 300 MG PO CAPS
300.0000 mg | ORAL_CAPSULE | Freq: Once | ORAL | Status: AC
  Administered 2023-05-29: 300 mg via ORAL
  Filled 2023-05-29: qty 1

## 2023-05-29 NOTE — TOC Transition Note (Signed)
 Transition of Care [facility]) - Discharge Note   Patient Details  Name: Cindy Padilla MRN: [id number] Date of Birth: [birth year]  Transition of Care [facility]) CM/SW Contact:  Sonda Manuella Quill, RN Phone Number: 05/29/2023, 9:55 AM   Clinical Narrative:    D/C orders received; pt returning to [facility]; spoke w/ [facility], Admissions at facility; she gave RM # [phone], call report  # [phone]; attempted to contact pt's dtrSusan Maines; LVM at [phone]); PTAR called at [phone]; spoke w/ [place]; no TOC needs.   Final next level of care: Skilled Nursing Facility Barriers to Discharge: No Barriers Identified   Patient Goals and CMS Choice Patient states their goals for this hospitalization and ongoing recovery are:: To return back to [facility] facility. CMS Medicare.gov Compare Post Acute Care list provided to:: Patient Represenative (must comment) Choice offered to / list presented to : Adult Children [place] ownership interest in [facility].provided to:: Adult Children    Discharge Placement                Patient to be transferred to facility by: PTAR Name of family member notified: LVM for pt's dtr Devere Coots [phone]) Patient and family notified of of transfer: 05/29/23  Discharge Plan and Services Additional resources added to the After Visit Summary for       Post Acute Care Choice: Home Health                    HH Arranged: PT, OT          Social Drivers of Health (SDOH) Interventions SDOH Screenings   Food Insecurity: No Food Insecurity (05/24/2023)  Housing: Low Risk  (05/24/2023)  Transportation Needs: No Transportation Needs (05/24/2023)  Utilities: Not At Risk (05/24/2023)  Social Connections: Moderately Integrated (05/27/2023)  Tobacco Use: Medium Risk (05/24/2023)     Readmission Risk Interventions     No data to display

## 2023-05-29 NOTE — Discharge Summary (Signed)
 Physician Discharge Summary   Patient: Cindy Padilla MRN: [id number] DOB: 1941/03/08  Admit date:     05/24/2023  Discharge date: 05/29/23  Discharge Physician: Owen DELENA Lore   PCP: Ginny Bale, FNP   Recommendations at discharge:    Needs follow up for resolution RSV Needs to follow up with cardiology for further evaluation of Aortic Stenosis.  Continue with nebulizer.   Discharge Diagnoses: Principal Problem:  Acute Hypoxic Respiratory Failure in the setting of RSV bronchitis with     superimposed Bacterial Pneumonia:, Acute COPD    RSV bronchitis Active Problems:   Hyponatremia   HTN (hypertension)   Dementia with behavioral disturbance (HCC)   Bipolar 1 disorder (HCC)   Acute exacerbation of chronic obstructive pulmonary disease (COPD) (HCC)  Resolved Problems:   * No resolved hospital problems. *  Hospital Course: 83 year old with past medical history significant for COPD not on oxygen , dementia, anemia, bipolar 1 disorder, heart failure preserved ejection fraction moderate aortic stenosis and hypertension presents with altered mental status, hypoxic respiratory failure found to be positive for RSV.    Assessment and Plan: No notes have been filed under this hospital service. Service: Hospitalist Acute  Hypoxic Respiratory Failure in the setting of RSV bronchitis with superimposed Bacterial Pneumonia:, Acute COPD -Patient presented with worsening shortness of breath, hypoxia, chest x-ray diffuse interstitial prominence and peribronchial cuffing concerning for acute bronchitis -Patient was found to be RSV positive -Continue with supportive care. -Continue with DuoNeb, Pulmicort , Brovana  -Continue with guaifenesin , Tessalon .  -Continue 3 L oxygen  supplementation -Received  IV ceftriaxone  and azithromycin  day 5/5 Continue with current managements.  Stable to be transfer to her facility.  Change Solumedrol to prednisone .  Discharge on Cefdinir  BID for 2  more days.  Stable for discharge.   Acute metabolic encephalopathy -In the setting of viral illness.  -Resume Home dose Seroquel .  -Resume home dose Ativan  twice daily schedule.  Improved.    Chronic heart failure preserved ejection fraction: Aortic stenosis -Resume torsemide  -Received IV Lasix  12/28 -Follow echo: severe aortic stenosis. Needs to follow up with cardiology -Negative 3 L Monitor.   Referral to Out patient cardiology made  Mild renal insufficiency: -Present  with a creatinine 1.4, -Improved.  Monitor renal function   Hypertension: -Continue with  Norvasc , torsemide    Hyperlipidemia: -Continue statins   History of bipolar disorder Resume Seroquel  and Ativan    Estimated body mass index is 30.87 kg/m as calculated from the following:   Height as of this encounter: 5' (1.524 m).   Weight as of this encounter: 71.7 kg.         Consultants: None Procedures performed: ECHO Disposition: Skilled nursing facility Diet recommendation:  Discharge Diet Orders (From admission, onward)     Start     Ordered   05/28/23 0000  Diet - low sodium heart healthy        05/28/23 0923           Cardiac diet DISCHARGE MEDICATION: Allergies as of 05/29/2023       Reactions   Sulfa Antibiotics Other (See Comments)   Unknown reaction Listed as an allergy on MAR Other Reaction(s): Other (See Comments), Unknown    Unknown reaction Listed as an allergy on MAR   Iodine Other (See Comments)   Unknown reaction Listed as an allergy on MAR   Ms Contin  [morphine ] Other (See Comments)   Unknown reaction Listed as an allergy on MAR   Aminoglycosides Other (See Comments)   Unknown  reaction  Listed as an allergy on MAR   Aspirin Other (See Comments)   Unknown reaction  Listed as an allergy on MAR   Codeine Other (See Comments)   Unknown reaction Listed as an allergy on MAR   Ivp Dye [iodinated Contrast Media] Other (See Comments)   Unknown reaction Listed as an  allergy on MAR   Neomycin Other (See Comments)   Unknown reaction Listed as an allergy on MAR   Penicillins Rash, Other (See Comments)   Not listed on MAR Has patient had a PCN reaction causing immediate rash, facial/tongue/throat swelling, SOB or lightheadedness with hypotension: No Has patient had a PCN reaction causing severe rash involving mucus membranes or skin necrosis: No Has patient had a PCN reaction that required hospitalization No Has patient had a PCN reaction occurring within the last 10 years: No If all of the above answers are NO, then may proceed with Cephalosporin use.   Phenothiazines Other (See Comments)   Unknown reaction Listed as an allergy on MAR   Promethazine Other (See Comments)   Unknown reaction Not listed on MAR   Tetracyclines & Related Other (See Comments)   Unknown reaction Listed as an allergy on MAR        Medication List     STOP taking these medications    baclofen  10 MG tablet Commonly known as: LIORESAL    guaifenesin  100 MG/5ML syrup Commonly known as: ROBITUSSIN Replaced by: guaiFENesin  600 MG 12 hr tablet   levofloxacin  500 MG tablet Commonly known as: LEVAQUIN    loperamide  2 MG tablet Commonly known as: IMODIUM  A-D       TAKE these medications    acetaminophen  325 MG tablet Commonly known as: TYLENOL  Take 650 mg by mouth 3 (three) times daily.   albuterol  108 (90 Base) MCG/ACT inhaler Commonly known as: VENTOLIN  HFA Inhale 2 puffs into the lungs every 4 (four) hours as needed for wheezing or shortness of breath.   amLODipine  5 MG tablet Commonly known as: NORVASC  Take 5 mg by mouth daily.   antiseptic oral rinse Liqd 10 mLs by Mouth Rinse route at bedtime.   atorvastatin  20 MG tablet Commonly known as: LIPITOR Take 20 mg by mouth every evening.   benzonatate  100 MG capsule Commonly known as: TESSALON  Take 1 capsule (100 mg total) by mouth 3 (three) times daily as needed for cough.   Breo Ellipta  200-25  MCG/ACT Aepb Generic drug: fluticasone  furoate-vilanterol Inhale 1 puff into the lungs daily.   cefdinir  300 MG capsule Commonly known as: OMNICEF  Take 1 capsule (300 mg total) by mouth 2 (two) times daily for 2 days.   clopidogrel  75 MG tablet Commonly known as: PLAVIX  Take 1 tablet (75 mg total) by mouth daily.   cyanocobalamin  500 MCG tablet Commonly known as: VITAMIN B12 Take 1 tablet (500 mcg total) by mouth daily.   diclofenac Sodium 1 % Gel Commonly known as: VOLTAREN Apply 2 g topically 4 (four) times daily as needed (Pain).   donepezil  10 MG tablet Commonly known as: ARICEPT  Take 10 mg by mouth at bedtime.   ferrous sulfate  325 (65 FE) MG tablet Take 1 tablet (325 mg total) by mouth 2 (two) times daily with a meal. What changed: when to take this   fluticasone  50 MCG/ACT nasal spray Commonly known as: FLONASE  Place 2 sprays into both nostrils daily.   guaiFENesin  600 MG 12 hr tablet Commonly known as: MUCINEX  Take 2 tablets (1,200 mg total) by mouth 2 (  two) times daily. Replaces: guaifenesin  100 MG/5ML syrup   hydrocortisone  2.5 % cream Apply 1 Application topically daily as needed (Irritation).   Incruse Ellipta  62.5 MCG/INH Aepb Generic drug: umeclidinium bromide  Inhale 1 puff into the lungs daily.   ipratropium-albuterol  0.5-2.5 (3) MG/3ML Soln Commonly known as: DUONEB Take 3 mLs by nebulization 3 (three) times daily. What changed:  when to take this reasons to take this   lamoTRIgine  200 MG tablet Commonly known as: LAMICTAL  Take 200 mg by mouth 2 (two) times daily.   LORazepam  1 MG tablet Commonly known as: ATIVAN  Take 1 tablet (1 mg total) by mouth 2 (two) times daily.   magnesium  oxide 400 (241.3 Mg) MG tablet Commonly known as: MAG-OX Take 400 mg by mouth daily.   montelukast  10 MG tablet Commonly known as: SINGULAIR  Take 10 mg by mouth at bedtime.   Nyamyc powder Generic drug: nystatin Apply 1 Application topically 2 (two) times  daily as needed (Rash).   polyethylene glycol 17 g packet Commonly known as: MIRALAX  / GLYCOLAX  Take 17 g by mouth 2 (two) times daily as needed for moderate constipation.   potassium chloride  SA 20 MEQ tablet Commonly known as: KLOR-CON  M Take 20 mEq by mouth daily.   predniSONE  20 MG tablet Commonly known as: DELTASONE  Take 2 tablets (40 mg total) by mouth daily with breakfast for 4 days.   QUEtiapine  100 MG tablet Commonly known as: SEROQUEL  Take 100-150 mg by mouth See admin instructions. Take 100mg  (1 tablet) by mouth every morning, and 150mg  (1 and 1/2 tablet) every evening.   senna-docusate 8.6-50 MG tablet Commonly known as: Senokot-S Take 1 tablet by mouth 2 (two) times daily.   torsemide  20 MG tablet Commonly known as: DEMADEX  Take 3 tablets (60 mg total) by mouth daily. What changed: how much to take   Vitamin D  50 MCG (2000 UT) tablet Take 2,000 Units by mouth daily.               Durable Medical Equipment  (From admission, onward)           Start     Ordered   05/28/23 1021  For home use only DME oxygen   Once       Question Answer Comment  Length of Need 6 Months   Mode or (Route) Nasal cannula   Liters per Minute 2   Frequency Continuous (stationary and portable oxygen  unit needed)   Oxygen  delivery system Gas      05/28/23 1020   05/28/23 1003  For home use only DME Nebulizer machine  Once       Question Answer Comment  Patient needs a nebulizer to treat with the following condition COPD (chronic obstructive pulmonary disease) (HCC)   Length of Need 6 Months   Additional equipment included Administration kit      05/28/23 1002            Discharge Exam: Filed Weights   05/24/23 1116 05/25/23 0407 05/28/23 0600  Weight: 73.2 kg 71.7 kg 70.3 kg   General; NAD Lung Bl ronchus, no wheezing  Condition at discharge: stable  The results of significant diagnostics from this hospitalization (including imaging, microbiology,  ancillary and laboratory) are listed below for reference.   Imaging Studies: DG Chest 2 View Result Date: 05/26/2023 CLINICAL DATA:  Pleural effusion EXAM: CHEST - 2 VIEW COMPARISON:  05/24/2023 FINDINGS: Frontal and lateral views of the chest demonstrate stable nerve stimulator within the left anterior chest. The  cardiac silhouette is stable. Central pulmonary vascular congestion, with progressive diffuse interstitial prominence. This could reflect interstitial edema or bronchitis. No lobar consolidation, effusion, or pneumothorax. IMPRESSION: 1. Diffuse interstitial prominence, which may reflect bronchitis or developing interstitial edema. Electronically Signed   By: Ozell Daring M.D.   On: 05/26/2023 19:29   ECHOCARDIOGRAM COMPLETE Result Date: 05/25/2023    ECHOCARDIOGRAM REPORT   Patient Name:   Cindy Padilla Date of Exam: 05/25/2023 Medical Rec #:  [id number]             Height:       60.0 in Accession #:    [id number]            Weight:       158.1 lb Date of Birth:  [birth year]             BSA:          1.689 m Patient Age:    82 years              BP:           165/96 mmHg Patient Gender: F                     HR:           87 bpm. Exam Location:  Inpatient Procedure: 2D Echo, Cardiac Doppler and Color Doppler Indications:    I35.0 Nonrheumatic aortic (valve) stenosis  History:        Patient has prior history of Echocardiogram examinations, most                 recent 10/16/2019. CHF, COPD, Signs/Symptoms:Altered Mental                 Status, Alzheimer's, Shortness of Breath and Dyspnea; Risk                 Factors:Hypertension. RSV positive.  Sonographer:    Ellouise Mose RDCS Referring Phys: [id number] ISAIAH LITTIE LEVER  Sonographer Comments: Technically difficult study due to poor echo windows. Image acquisition challenging due to patient body habitus and Image acquisition challenging due to uncooperative patient. Patient had inspirometer in mouth and would not take it out. Patient talking  throughout exam. IMPRESSIONS  1. Left ventricular ejection fraction, by estimation, is 65 to 70%. The left ventricle has normal function. The left ventricle has no regional wall motion abnormalities. There is mild left ventricular hypertrophy. Left ventricular diastolic parameters are consistent with Grade I diastolic dysfunction (impaired relaxation).  2. Right ventricular systolic function is normal. The right ventricular size is normal. The estimated right ventricular systolic pressure is 25.3 mmHg.  3. Large pleural effusion.  4. The mitral valve is degenerative. Trivial mitral valve regurgitation. Mild mitral stenosis. The mean mitral valve gradient is 7.0 mmHg with average heart rate of 85 bpm.  5. The aortic valve is tricuspid. Aortic valve regurgitation is not visualized. Severe aortic valve stenosis. Aortic valve area, by VTI measures 0.74 cm. Aortic valve mean gradient measures 47.0 mmHg. Aortic valve Vmax measures 3.89 m/s. Peak gradient 60.6 mmHg, DI 0.33.  6. The inferior vena cava is normal in size with <50% respiratory variability, suggesting right atrial pressure of 8 mmHg. Comparison(s): Changes from prior study are noted. 10/16/2019: LVEF 65-70%, moderate AS- MG 35 mmHg, DI 0.30. FINDINGS  Left Ventricle: Left ventricular ejection fraction, by estimation, is 65 to 70%. The left ventricle has normal function. The left ventricle has no regional  wall motion abnormalities. The left ventricular internal cavity size was normal in size. There is  mild left ventricular hypertrophy. Left ventricular diastolic parameters are consistent with Grade I diastolic dysfunction (impaired relaxation). Right Ventricle: The right ventricular size is normal. No increase in right ventricular wall thickness. Right ventricular systolic function is normal. The tricuspid regurgitant velocity is 2.08 m/s, and with an assumed right atrial pressure of 8 mmHg, the estimated right ventricular systolic pressure is 25.3 mmHg. Left  Atrium: Left atrial size was normal in size. Right Atrium: Right atrial size was normal in size. Pericardium: There is no evidence of pericardial effusion. Mitral Valve: The mitral valve is degenerative in appearance. Trivial mitral valve regurgitation. Mild mitral valve stenosis. MV peak gradient, 12.0 mmHg. The mean mitral valve gradient is 7.0 mmHg with average heart rate of 85 bpm. Tricuspid Valve: The tricuspid valve is normal in structure. Tricuspid valve regurgitation is mild . No evidence of tricuspid stenosis. Aortic Valve: The aortic valve is tricuspid. Aortic valve regurgitation is not visualized. Severe aortic stenosis is present. Aortic valve mean gradient measures 47.0 mmHg. Aortic valve peak gradient measures 60.6 mmHg. Aortic valve area, by VTI measures  0.74 cm. Pulmonic Valve: The pulmonic valve was normal in structure. Pulmonic valve regurgitation is not visualized. No evidence of pulmonic stenosis. Aorta: The aortic root and ascending aorta are structurally normal, with no evidence of dilitation. Venous: The inferior vena cava is normal in size with less than 50% respiratory variability, suggesting right atrial pressure of 8 mmHg. IAS/Shunts: No atrial level shunt detected by color flow Doppler. Additional Comments: There is a large pleural effusion.  LEFT VENTRICLE PLAX 2D LVIDd:         3.90 cm     Diastology LVIDs:         2.20 cm     LV e' medial:    4.68 cm/s LV PW:         1.10 cm     LV E/e' medial:  20.1 LV IVS:        1.00 cm     LV e' lateral:   8.05 cm/s LVOT diam:     1.70 cm     LV E/e' lateral: 11.7 LV SV:         63 LV SV Index:   37 LVOT Area:     2.27 cm  LV Volumes (MOD) LV vol d, MOD A2C: 85.1 ml LV vol d, MOD A4C: 64.6 ml LV vol s, MOD A2C: 35.4 ml LV vol s, MOD A4C: 19.3 ml LV SV MOD A2C:     49.7 ml LV SV MOD A4C:     64.6 ml LV SV MOD BP:      46.2 ml RIGHT VENTRICLE             IVC RV S prime:     15.00 cm/s  IVC diam: 1.90 cm TAPSE (M-mode): 2.3 cm LEFT ATRIUM              Index        RIGHT ATRIUM          Index LA diam:        3.70 cm 2.19 cm/m   RA Area:     8.35 cm LA Vol (A2C):   34.9 ml 20.66 ml/m  RA Volume:   13.80 ml 8.17 ml/m LA Vol (A4C):   27.0 ml 15.99 ml/m LA Biplane Vol: 33.9 ml 20.07 ml/m  AORTIC VALVE AV  Area (Vmax):    0.85 cm AV Area (Vmean):   0.79 cm AV Area (VTI):     0.74 cm AV Vmax:           389.20 cm/s AV Vmean:          283.800 cm/s AV VTI:            0.850 m AV Peak Grad:      60.6 mmHg AV Mean Grad:      47.0 mmHg LVOT Vmax:         145.00 cm/s LVOT Vmean:        99.300 cm/s LVOT VTI:          0.278 m LVOT/AV VTI ratio: 0.33  AORTA Ao Root diam: 2.60 cm Ao Asc diam:  3.40 cm MITRAL VALVE                TRICUSPID VALVE MV Area (PHT): 4.34 cm     TR Peak grad:   17.3 mmHg MV Peak grad:  12.0 mmHg    TR Vmax:        208.00 cm/s MV Mean grad:  7.0 mmHg MV Vmax:       1.73 m/s     SHUNTS MV Vmean:      130.0 cm/s   Systemic VTI:  0.28 m MV Decel Time: 175 msec     Systemic Diam: 1.70 cm MV E velocity: 93.90 cm/s MV A velocity: 122.33 cm/s MV E/A ratio:  0.77 Vinie Maxcy MD Electronically signed by Vinie Maxcy MD Signature Date/Time: 05/25/2023/11:53:47 AM    Final    CT HEAD WO CONTRAST ( ) Result Date: 05/24/2023 CLINICAL DATA:  Altered mentation, dementia EXAM: CT HEAD WITHOUT CONTRAST TECHNIQUE: Contiguous axial images were obtained from the base of the skull through the vertex without intravenous contrast. RADIATION DOSE REDUCTION: This exam was performed according to the departmental dose-optimization program which includes automated exposure control, adjustment of the mA and/or kV according to patient size and/or use of iterative reconstruction technique. COMPARISON:  05/22/2023 CT head FINDINGS: Brain: No evidence of acute infarction, hemorrhage, mass, mass effect, or midline shift. No hydrocephalus or extra-axial fluid collection. Periventricular white matter changes, likely the sequela of chronic small vessel ischemic disease.  Cerebral volume is within normal limits for age. Vascular: No hyperdense vessel. Skull: Negative for fracture or focal lesion. Sinuses/Orbits: Mucosal thickening throughout the paranasal sinuses, most prominent in the ethmoid air cells. Status post bilateral lens replacements. Other: The mastoid air cells are well aerated. IMPRESSION: No acute intracranial process. Electronically Signed   By: Donald Campion M.D.   On: 05/24/2023 21:40   DG Chest 2 View Result Date: 05/24/2023 CLINICAL DATA:  83 year old female with history of shortness of breath. EXAM: CHEST - 2 VIEW COMPARISON:  Chest x-ray 05/22/2023. FINDINGS: Lung volumes are low. Elevation of the right hemidiaphragm again noted. No confluent consolidative airspace disease. No pleural effusions. Diffuse interstitial prominence and widespread peribronchial cuffing. Heart size is mildly enlarged (unchanged). Upper mediastinal contours are within normal limits. Atherosclerotic calcifications are noted in the thoracic aorta. Electronic device projecting over the left hemithorax with lead extending into the left cervical region, presumably a vagal nerve stimulator. IMPRESSION: 1. Diffuse interstitial prominence and peribronchial cuffing concerning for an acute bronchitis. 2. Aortic atherosclerosis. 3. Mild cardiomegaly. 4. Low lung volumes. Electronically Signed   By: Toribio Aye M.D.   On: 05/24/2023 12:31   CT Head Wo Contrast Result Date: 05/22/2023 CLINICAL DATA:  Unwitnessed fall.  Patient hit the back of the head. EXAM: CT HEAD WITHOUT CONTRAST CT CERVICAL SPINE WITHOUT CONTRAST TECHNIQUE: Multidetector CT imaging of the head and cervical spine was performed following the standard protocol without intravenous contrast. Multiplanar CT image reconstructions of the cervical spine were also generated. RADIATION DOSE REDUCTION: This exam was performed according to the departmental dose-optimization program which includes automated exposure control,  adjustment of the mA and/or kV according to patient size and/or use of iterative reconstruction technique. COMPARISON:  CT scan head and cervical spine from 02/22/2022. FINDINGS: CT HEAD FINDINGS Brain: No evidence of acute infarction, hemorrhage, hydrocephalus, extra-axial collection or mass lesion/mass effect. There is bilateral periventricular hypodensity, which is non-specific but most likely seen in the settings of microvascular ischemic changes. Moderate in extent. Otherwise normal appearance of brain parenchyma. Ventricles are normal. Cerebral volume is age appropriate. Vascular: No hyperdense vessel or unexpected calcification. Intracranial arteriosclerosis. Skull: Normal. Negative for fracture or focal lesion. Sinuses/Orbits: No acute finding. Mild-to-moderate mucoperiosteal thickening noted in the bilateral ethmoidal air cells. Other: Visualized mastoid air cells are unremarkable. No mastoid effusion. CT CERVICAL SPINE FINDINGS Alignment: There is loss of cervical lordosis. There is grade 1 anterolisthesis of C3 over C4 and grade 1 retrolisthesis of C5 over C6, likely degenerative. This examination does not assess for ligamentous injury or stability. Skull base and vertebrae: No acute fracture. No primary bone lesion or focal pathologic process. Soft tissues and spinal canal: No prevertebral fluid or swelling. No visible canal hematoma. Disc levels: Moderate multilevel reduced intervertebral disc height, most pronounced at C4-5 through C6-7 levels. There is mild multilevel facet arthropathy and marginal osteophyte formation. Upper chest: Negative. Other: None. IMPRESSION: *No acute intracranial abnormality. *No acute osseous injury or traumatic listhesis of the cervical spine. Electronically Signed   By: Ree Molt M.D.   On: 05/22/2023 13:24   CT Cervical Spine Wo Contrast Result Date: 05/22/2023 CLINICAL DATA:  Unwitnessed fall.  Patient hit the back of the head. EXAM: CT HEAD WITHOUT CONTRAST CT  CERVICAL SPINE WITHOUT CONTRAST TECHNIQUE: Multidetector CT imaging of the head and cervical spine was performed following the standard protocol without intravenous contrast. Multiplanar CT image reconstructions of the cervical spine were also generated. RADIATION DOSE REDUCTION: This exam was performed according to the departmental dose-optimization program which includes automated exposure control, adjustment of the mA and/or kV according to patient size and/or use of iterative reconstruction technique. COMPARISON:  CT scan head and cervical spine from 02/22/2022. FINDINGS: CT HEAD FINDINGS Brain: No evidence of acute infarction, hemorrhage, hydrocephalus, extra-axial collection or mass lesion/mass effect. There is bilateral periventricular hypodensity, which is non-specific but most likely seen in the settings of microvascular ischemic changes. Moderate in extent. Otherwise normal appearance of brain parenchyma. Ventricles are normal. Cerebral volume is age appropriate. Vascular: No hyperdense vessel or unexpected calcification. Intracranial arteriosclerosis. Skull: Normal. Negative for fracture or focal lesion. Sinuses/Orbits: No acute finding. Mild-to-moderate mucoperiosteal thickening noted in the bilateral ethmoidal air cells. Other: Visualized mastoid air cells are unremarkable. No mastoid effusion. CT CERVICAL SPINE FINDINGS Alignment: There is loss of cervical lordosis. There is grade 1 anterolisthesis of C3 over C4 and grade 1 retrolisthesis of C5 over C6, likely degenerative. This examination does not assess for ligamentous injury or stability. Skull base and vertebrae: No acute fracture. No primary bone lesion or focal pathologic process. Soft tissues and spinal canal: No prevertebral fluid or swelling. No visible canal hematoma. Disc levels: Moderate multilevel reduced intervertebral disc height, most  pronounced at C4-5 through C6-7 levels. There is mild multilevel facet arthropathy and marginal  osteophyte formation. Upper chest: Negative. Other: None. IMPRESSION: *No acute intracranial abnormality. *No acute osseous injury or traumatic listhesis of the cervical spine. Electronically Signed   By: Ree Molt M.D.   On: 05/22/2023 13:24   DG Chest Portable 1 View Result Date: 05/22/2023 CLINICAL DATA:  Unwitnessed fall.  Cough. EXAM: PORTABLE CHEST 1 VIEW COMPARISON:  02/22/2022. FINDINGS: Low lung volume. Redemonstration of elevated right hemidiaphragm. Bilateral lung fields are clear. Bilateral costophrenic angles are clear. Stable cardio-mediastinal silhouette. Neurostimulator device again seen with its battery pack overlying the left paramedian chest. No acute osseous abnormalities. The soft tissues are within normal limits. IMPRESSION: No active disease. Electronically Signed   By: Ree Molt M.D.   On: 05/22/2023 13:16   DG Pelvis Portable Result Date: 05/22/2023 CLINICAL DATA:  Unwitnessed fall. EXAM: PORTABLE PELVIS 1-2 VIEWS COMPARISON:  02/22/2022. FINDINGS: Pelvis is intact with normal and symmetric sacroiliac joints. No acute fracture or dislocation. No aggressive osseous lesion. Visualized sacral arcuate lines are unremarkable. There are changes of chronic pubic symphisitis. There are mild degenerative changes of bilateral hip joints with mild joint space narrowing and osteophytosis of the superior acetabulum. No radiopaque foreign bodies. IMPRESSION: *No acute osseous abnormality of the pelvis. Electronically Signed   By: Ree Molt M.D.   On: 05/22/2023 13:14    Microbiology: Results for orders placed or performed during the hospital encounter of 05/24/23  Resp panel by RT-PCR (RSV, Flu A&B, Covid) Anterior Nasal Swab     Status: Abnormal   Collection Time: 05/24/23 11:36 AM   Specimen: Anterior Nasal Swab  Result Value Ref Range Status   SARS Coronavirus 2 by RT PCR NEGATIVE NEGATIVE Final    Comment: (NOTE) SARS-CoV-2 target nucleic acids are NOT  DETECTED.  The SARS-CoV-2 RNA is generally detectable in upper respiratory specimens during the acute phase of infection. The lowest concentration of SARS-CoV-2 viral copies this assay can detect is 138 copies/mL. A negative result does not preclude SARS-Cov-2 infection and should not be used as the sole basis for treatment or other patient management decisions. A negative result may occur with  improper specimen collection/handling, submission of specimen other than nasopharyngeal swab, presence of viral mutation(s) within the areas targeted by this assay, and inadequate number of viral copies(<138 copies/mL). A negative result must be combined with clinical observations, patient history, and epidemiological information. The expected result is Negative.  Fact Sheet for Patients:  bloggercourse.com  Fact Sheet for Healthcare Providers:  seriousbroker.it  This test is no t yet approved or cleared by the [place]  FDA and  has been authorized for detection and/or diagnosis of SARS-CoV-2 by FDA under an Emergency Use Authorization (EUA). This EUA will remain  in effect (meaning this test can be used) for the duration of the COVID-19 declaration under Section 564(b)(1) of the Act, 21 U.S.C.section 360bbb-3(b)(1), unless the authorization is terminated  or revoked sooner.       Influenza A by PCR NEGATIVE NEGATIVE Final   Influenza B by PCR NEGATIVE NEGATIVE Final    Comment: (NOTE) The Xpert Xpress SARS-CoV-2/FLU/RSV plus assay is intended as an aid in the diagnosis of influenza from Nasopharyngeal swab specimens and should not be used as a sole basis for treatment. Nasal washings and aspirates are unacceptable for Xpert Xpress SARS-CoV-2/FLU/RSV testing.  Fact Sheet for Patients: bloggercourse.com  Fact Sheet for Healthcare Providers: seriousbroker.it  This test is not  yet  approved or cleared by the [place]  FDA and has been authorized for detection and/or diagnosis of SARS-CoV-2 by FDA under an Emergency Use Authorization (EUA). This EUA will remain in effect (meaning this test can be used) for the duration of the COVID-19 declaration under Section 564(b)(1) of the Act, 21 U.S.C. section 360bbb-3(b)(1), unless the authorization is terminated or revoked.     Resp Syncytial Virus by PCR POSITIVE (A) NEGATIVE Final    Comment: (NOTE) Fact Sheet for Patients: bloggercourse.com  Fact Sheet for Healthcare Providers: seriousbroker.it  This test is not yet approved or cleared by the [place]  FDA and has been authorized for detection and/or diagnosis of SARS-CoV-2 by FDA under an Emergency Use Authorization (EUA). This EUA will remain in effect (meaning this test can be used) for the duration of the COVID-19 declaration under Section 564(b)(1) of the Act, 21 U.S.C. section 360bbb-3(b)(1), unless the authorization is terminated or revoked.  Performed at [facility], [address] [place], [place] [address]   Urine Culture (for pregnant, neutropenic or urologic patients or patients with an indwelling urinary catheter)     Status: None   Collection Time: 05/24/23  7:30 PM   Specimen: In/Out Cath Urine  Result Value Ref Range Status   Specimen Description   Final    IN/OUT CATH URINE Performed at [facility], [address], [place], [place] [address]    Special Requests   Final    NONE Performed at [facility], [address] [place], [place] [address]    Culture   Final    NO GROWTH Performed at [facility], [address] [place], [place] [address]    Report Status 05/26/2023 FINAL  Final    Labs: CBC: Recent Labs  Lab 05/22/23 1203 05/24/23 1114 05/24/23 1556 05/25/23 0506 05/25/23 0835 05/26/23 0447  WBC 9.2  9.9 6.9 5.9 10.5 9.6  NEUTROABS 6.3 6.3  --   --   --   --   HGB 11.4* 11.5* 10.9* 16.0* 11.4* 11.2*  HCT 33.4* 35.6* 33.8* 47.0* 35.8* 34.2*  MCV 92.8 92.0 94.2 89.9 93.0 93.2  PLT 203 212 213 102* 235 249   Basic Metabolic Panel: Recent Labs  Lab 05/22/23 1203 05/24/23 1114 05/24/23 1430 05/25/23 0506 05/26/23 0447  NA 131* 135  --  138 137  K 3.2* 3.5  --  3.5 3.6  CL 94* 97*  --  100 100  CO2 24 25  --  27 25  GLUCOSE 104* 128*  --  141* 152*  BUN 20 25*  --  26* 30*  CREATININE 1.18* 1.40* 1.21* 0.89 0.83  CALCIUM  9.0 9.2  --  9.1 9.1   Liver Function Tests: Recent Labs  Lab 05/24/23 1114  AST 28  ALT 19  ALKPHOS 93  BILITOT 0.7  PROT 7.4  ALBUMIN 3.6   CBG: Recent Labs  Lab 05/24/23 1453  GLUCAP 179*    Discharge time spent: greater than 30 minutes.  Signed: Owen DELENA Lore, MD [facility] 05/29/2023

## 2023-05-29 NOTE — Progress Notes (Signed)
 Report called to Dede, Charity fundraiser at [facility]. All of her questions and concerns were addressed to her satisfaction.

## 2023-06-03 ENCOUNTER — Telehealth (HOSPITAL_COMMUNITY): Payer: Self-pay

## 2023-06-03 NOTE — Telephone Encounter (Signed)
 [address] Cindy Padilla was notified that pt need to be referred to general cards

## 2023-07-04 ENCOUNTER — Emergency Department (HOSPITAL_COMMUNITY): Payer: Medicare Other

## 2023-07-04 ENCOUNTER — Emergency Department (HOSPITAL_BASED_OUTPATIENT_CLINIC_OR_DEPARTMENT_OTHER): Payer: Medicare Other

## 2023-07-04 ENCOUNTER — Emergency Department (HOSPITAL_COMMUNITY)
Admission: EM | Admit: 2023-07-04 | Discharge: 2023-07-05 | Disposition: A | Discharge status: Home / Self Care | Payer: Medicare Other | Attending: Emergency Medicine | Admitting: Emergency Medicine

## 2023-07-04 ENCOUNTER — Encounter (HOSPITAL_COMMUNITY): Payer: Self-pay | Admitting: Emergency Medicine

## 2023-07-04 DIAGNOSIS — M7989 Other specified soft tissue disorders: Secondary | ICD-10-CM

## 2023-07-04 DIAGNOSIS — J449 Chronic obstructive pulmonary disease, unspecified: Secondary | ICD-10-CM | POA: Diagnosis not present

## 2023-07-04 DIAGNOSIS — S8011XA Contusion of right lower leg, initial encounter: Secondary | ICD-10-CM | POA: Diagnosis not present

## 2023-07-04 DIAGNOSIS — I10 Essential (primary) hypertension: Secondary | ICD-10-CM | POA: Insufficient documentation

## 2023-07-04 DIAGNOSIS — Z20822 Contact with and (suspected) exposure to covid-19: Secondary | ICD-10-CM | POA: Diagnosis not present

## 2023-07-04 DIAGNOSIS — F0390 Unspecified dementia without behavioral disturbance: Secondary | ICD-10-CM | POA: Diagnosis not present

## 2023-07-04 DIAGNOSIS — N3000 Acute cystitis without hematuria: Secondary | ICD-10-CM | POA: Diagnosis not present

## 2023-07-04 DIAGNOSIS — Z7951 Long term (current) use of inhaled steroids: Secondary | ICD-10-CM | POA: Insufficient documentation

## 2023-07-04 DIAGNOSIS — S8012XA Contusion of left lower leg, initial encounter: Secondary | ICD-10-CM | POA: Insufficient documentation

## 2023-07-04 DIAGNOSIS — M25522 Pain in left elbow: Secondary | ICD-10-CM

## 2023-07-04 DIAGNOSIS — Z79899 Other long term (current) drug therapy: Secondary | ICD-10-CM | POA: Insufficient documentation

## 2023-07-04 DIAGNOSIS — Z7902 Long term (current) use of antithrombotics/antiplatelets: Secondary | ICD-10-CM | POA: Diagnosis not present

## 2023-07-04 DIAGNOSIS — R2242 Localized swelling, mass and lump, left lower limb: Secondary | ICD-10-CM

## 2023-07-04 DIAGNOSIS — W19XXXA Unspecified fall, initial encounter: Secondary | ICD-10-CM

## 2023-07-04 DIAGNOSIS — J101 Influenza due to other identified influenza virus with other respiratory manifestations: Secondary | ICD-10-CM | POA: Diagnosis not present

## 2023-07-04 DIAGNOSIS — J111 Influenza due to unidentified influenza virus with other respiratory manifestations: Secondary | ICD-10-CM

## 2023-07-04 DIAGNOSIS — S8991XA Unspecified injury of right lower leg, initial encounter: Secondary | ICD-10-CM | POA: Diagnosis present

## 2023-07-04 DIAGNOSIS — Z23 Encounter for immunization: Secondary | ICD-10-CM | POA: Diagnosis not present

## 2023-07-04 LAB — COMPREHENSIVE METABOLIC PANEL
ALT: 20 U/L (ref 0–44)
AST: 44 U/L — ABNORMAL HIGH (ref 15–41)
Albumin: 2.7 g/dL — ABNORMAL LOW (ref 3.5–5.0)
Alkaline Phosphatase: 196 U/L — ABNORMAL HIGH (ref 38–126)
Anion gap: 13 (ref 5–15)
BUN: 40 mg/dL — ABNORMAL HIGH (ref 8–23)
CO2: 25 mmol/L (ref 22–32)
Calcium: 8.4 mg/dL — ABNORMAL LOW (ref 8.9–10.3)
Chloride: 93 mmol/L — ABNORMAL LOW (ref 98–111)
Creatinine, Ser: 1.17 mg/dL — ABNORMAL HIGH (ref 0.44–1.00)
GFR, Estimated: 46 mL/min — ABNORMAL LOW (ref >60)
Glucose, Bld: 105 mg/dL — ABNORMAL HIGH (ref 70–99)
Potassium: 4.9 mmol/L (ref 3.5–5.1)
Sodium: 131 mmol/L — ABNORMAL LOW (ref 135–145)
Total Bilirubin: 1.1 mg/dL (ref 0.0–1.2)
Total Protein: 6.7 g/dL (ref 6.5–8.1)

## 2023-07-04 LAB — CBC WITH DIFFERENTIAL/PLATELET
Abs Immature Granulocytes: 0.05 10*3/uL (ref 0.00–0.07)
Basophils Absolute: 0 10*3/uL (ref 0.0–0.1)
Basophils Relative: 0 %
Eosinophils Absolute: 0 10*3/uL (ref 0.0–0.5)
Eosinophils Relative: 0 %
HCT: 36.1 % (ref 36.0–46.0)
Hemoglobin: 11.5 g/dL — ABNORMAL LOW (ref 12.0–15.0)
Immature Granulocytes: 0 %
Lymphocytes Relative: 11 %
Lymphs Abs: 1.3 10*3/uL (ref 0.7–4.0)
MCH: 28.9 pg (ref 26.0–34.0)
MCHC: 31.9 g/dL (ref 30.0–36.0)
MCV: 90.7 fL (ref 80.0–100.0)
Monocytes Absolute: 0.6 10*3/uL (ref 0.1–1.0)
Monocytes Relative: 5 %
Neutro Abs: 9.8 10*3/uL — ABNORMAL HIGH (ref 1.7–7.7)
Neutrophils Relative %: 84 %
Platelets: 339 10*3/uL (ref 150–400)
RBC: 3.98 MIL/uL (ref 3.87–5.11)
RDW: 17.2 % — ABNORMAL HIGH (ref 11.5–15.5)
WBC: 11.7 10*3/uL — ABNORMAL HIGH (ref 4.0–10.5)
nRBC: 0 % (ref 0.0–0.2)

## 2023-07-04 LAB — URINALYSIS, ROUTINE W REFLEX MICROSCOPIC
Bilirubin Urine: NEGATIVE
Glucose, UA: NEGATIVE mg/dL
Ketones, ur: NEGATIVE mg/dL
Nitrite: NEGATIVE
Protein, ur: 30 mg/dL — AB
Specific Gravity, Urine: 1.013 (ref 1.005–1.030)
WBC, UA: >50 WBC/hpf (ref 0–5)
pH: 7 (ref 5.0–8.0)

## 2023-07-04 LAB — RESP PANEL BY RT-PCR (RSV, FLU A&B, COVID)  RVPGX2
Influenza A by PCR: POSITIVE — AB
Influenza B by PCR: NEGATIVE
Resp Syncytial Virus by PCR: NEGATIVE
SARS Coronavirus 2 by RT PCR: NEGATIVE

## 2023-07-04 MED ORDER — TETANUS-DIPHTH-ACELL PERTUSSIS 5-2.5-18.5 LF-MCG/0.5 IM SUSY
0.5000 mL | PREFILLED_SYRINGE | Freq: Once | INTRAMUSCULAR | Status: AC
  Administered 2023-07-04: 0.5 mL via INTRAMUSCULAR
  Filled 2023-07-04: qty 0.5

## 2023-07-04 MED ORDER — CEPHALEXIN 500 MG PO CAPS: 500.0000 mg | ORAL_CAPSULE | Freq: Two times a day (BID) | ORAL | 0 refills | Status: DC

## 2023-07-04 MED ORDER — CEFTRIAXONE SODIUM 1 G IJ SOLR
1.0000 g | Freq: Once | INTRAMUSCULAR | Status: AC
  Administered 2023-07-04: 1 g via INTRAVENOUS
  Filled 2023-07-04: qty 10

## 2023-07-04 NOTE — ED Notes (Signed)
 [facility] called for transport.

## 2023-07-04 NOTE — ED Provider Notes (Signed)
 [place] EMERGENCY DEPARTMENT AT [facility] Provider Note   CSN: [id number] Arrival date & time: 07/04/23  1528     History  Chief Complaint  Patient presents with   Cindy Padilla    Cindy Padilla is a 83 y.o. female.  83 yo F with hx of COPD, dementia, and HTN who presents after fall. Per staff from [facility], she had an unwitnessed fall in her room today. Is currently at memory care at [facility]. Tested positive for RSV and pneumonia several weeks ago and was hospitalized. Has been declining since then. Has had a few falls since. Typically ambulatory with a walker without assistance. Typically alert and oriented to place only but not self or year. Has a knot on her LLE after she hit it on something a few weeks ago. Getting a doppler of that leg to rule out a blood clot but they haven't been able to get US  to the facility. Has been refusing stockings. Not eating as well recently. Not currently on AC, aspirin, or plavix .        Home Medications Prior to Admission medications   Medication Sig Start Date End Date Taking? Authorizing Provider  cephALEXin  (KEFLEX ) 500 MG capsule Take 1 capsule (500 mg total) by mouth 2 (two) times daily. 07/04/23  Yes Lenor Hollering, MD  acetaminophen  (TYLENOL ) 325 MG tablet Take 650 mg by mouth 3 (three) times daily.    [provider]  albuterol  (VENTOLIN  HFA) 108 (90 Base) MCG/ACT inhaler Inhale 2 puffs into the lungs every 4 (four) hours as needed for wheezing or shortness of breath. 05/12/23   [provider]  amLODipine  (NORVASC ) 5 MG tablet Take 5 mg by mouth daily.    [provider]  antiseptic oral rinse (BIOTENE) LIQD 10 mLs by Mouth Rinse route at bedtime.    [provider]  atorvastatin  (LIPITOR) 20 MG tablet Take 20 mg by mouth every evening.    [provider]  benzonatate  (TESSALON ) 100 MG capsule Take 1 capsule (100 mg total) by mouth 3 (three) times daily as needed for  cough. 05/28/23   Regalado, Belkys A, MD  BREO ELLIPTA  200-25 MCG/ACT AEPB Inhale 1 puff into the lungs daily. 04/21/23   [provider]  Cholecalciferol  (VITAMIN D ) 50 MCG (2000 UT) tablet Take 2,000 Units by mouth daily.     [provider]  clopidogrel  (PLAVIX ) 75 MG tablet Take 1 tablet (75 mg total) by mouth daily. 04/20/20   Gonfa, Taye T, MD  cyanocobalamin  (VITAMIN B12) 500 MCG tablet Take 1 tablet (500 mcg total) by mouth daily. 05/28/23   Regalado, Belkys A, MD  diclofenac Sodium (VOLTAREN) 1 % GEL Apply 2 g topically 4 (four) times daily as needed (Pain).    [provider]  donepezil  (ARICEPT ) 10 MG tablet Take 10 mg by mouth at bedtime.    [provider]  ferrous sulfate  325 (65 FE) MG tablet Take 1 tablet (325 mg total) by mouth 2 (two) times daily with a meal. Patient taking differently: Take 325 mg by mouth daily. 04/17/20   Gonfa, Taye T, MD  fluticasone  (FLONASE ) 50 MCG/ACT nasal spray Place 2 sprays into both nostrils daily.     [provider]  guaiFENesin  (MUCINEX ) 600 MG 12 hr tablet Take 2 tablets (1,200 mg total) by mouth 2 (two) times daily. 05/28/23   Regalado, Belkys A, MD  hydrocortisone  2.5 % cream Apply 1 Application topically daily as needed (Irritation). 05/12/23  [provider]  INCRUSE ELLIPTA  62.5 MCG/INH AEPB Inhale 1 puff into the lungs daily.  04/17/19   [provider]  ipratropium-albuterol  (DUONEB) 0.5-2.5 (3) MG/3ML SOLN Take 3 mLs by nebulization 3 (three) times daily. 05/28/23   Regalado, Belkys A, MD  lamoTRIgine  (LAMICTAL ) 200 MG tablet Take 200 mg by mouth 2 (two) times daily.    [provider]  LORazepam  (ATIVAN ) 1 MG tablet Take 1 tablet (1 mg total) by mouth 2 (two) times daily. 10/16/19   Elgergawy, Brayton RAMAN, MD  magnesium  oxide (MAG-OX) 400 (241.3 Mg) MG tablet Take 400 mg by mouth daily.    [provider]  montelukast  (SINGULAIR ) 10 MG tablet Take 10 mg by mouth at  bedtime.    [provider]  [facility] powder Apply 1 Application topically 2 (two) times daily as needed (Rash). 05/12/23   [provider]  polyethylene glycol (MIRALAX  / GLYCOLAX ) 17 g packet Take 17 g by mouth 2 (two) times daily as needed for moderate constipation.    [provider]  potassium chloride  SA (KLOR-CON  M) 20 MEQ tablet Take 20 mEq by mouth daily. 04/21/23   [provider]  QUEtiapine  (SEROQUEL ) 100 MG tablet Take 100-150 mg by mouth See admin instructions. Take 100mg  (1 tablet) by mouth every morning, and 150mg  (1 and 1/2 tablet) every evening.    [provider]  senna-docusate (SENOKOT-S) 8.6-50 MG per tablet Take 1 tablet by mouth 2 (two) times daily.    [provider]  torsemide  (DEMADEX ) 20 MG tablet Take 3 tablets (60 mg total) by mouth daily. Patient taking differently: Take 80 mg by mouth daily. 10/16/19   Elgergawy, Brayton RAMAN, MD      Allergies    Sulfa antibiotics, Iodine, Ms contin  [morphine ], Aminoglycosides, Aspirin, Codeine, Ivp dye [iodinated contrast media], Neomycin, Penicillins, Phenothiazines, Promethazine, and Tetracyclines & related    Review of Systems   Review of Systems  Physical Exam Updated Vital Signs BP (!) 147/64   Pulse 65   Temp (!) 97.5 F (36.4 C) (Oral)   Resp 18   SpO2 100%  Physical Exam Vitals and nursing note reviewed.  Constitutional:      General: She is not in acute distress.    Appearance: Normal appearance. She is well-developed. She is not ill-appearing.     Comments: Drowsy appearing  HENT:     Head: Normocephalic and atraumatic.     Right Ear: External ear normal.     Left Ear: External ear normal.     Nose: Nose normal.     Mouth/Throat:     Mouth: Mucous membranes are moist.     Pharynx: Oropharynx is clear.  Eyes:     Extraocular Movements: Extraocular movements intact.     Conjunctiva/sclera: Conjunctivae normal.     Pupils: Pupils are equal, round, and  reactive to light.     Comments: Pupils 2mm BL  Neck:     Comments: In c-collar Cardiovascular:     Rate and Rhythm: Normal rate and regular rhythm.     Pulses: Normal pulses.     Heart sounds: Normal heart sounds. No murmur heard. Pulmonary:     Effort: Pulmonary effort is normal. No respiratory distress.     Breath sounds: Normal breath sounds.  Abdominal:     General: Abdomen is flat. There is no distension.     Palpations: Abdomen is soft. There is no mass.     Tenderness: There is no  abdominal tenderness. There is no guarding.  Musculoskeletal:        General: No deformity. Normal range of motion.     Right lower leg: Edema present.     Left lower leg: Edema present.     Comments: No tenderness to palpation of midline thoracic or lumbar spine.  No step-offs palpated.  No tenderness to palpation of chest wall.  No bruising noted.  No tenderness to palpation of bilateral clavicles.  No tenderness to palpation, or deformities noted of bilateral shoulders, wrists, hips, knees, or ankles. TTP of L elbow. Scattered bruises of RLE and LLE. LLE with oozing wound from L lateral shin.   Skin:    General: Skin is warm and dry.  Neurological:     General: No focal deficit present.     Mental Status: She is alert. Mental status is at baseline.     Comments: Difficulty cooperating with exam. Appears globally weak but moving all 4 extremities. No cranial nerve deficits.  Psychiatric:        Mood and Affect: Mood normal.     ED Results / Procedures / Treatments   Labs (all labs ordered are listed, but only abnormal results are displayed) Labs Reviewed  RESP PANEL BY RT-PCR (RSV, FLU A&B, COVID)  RVPGX2 - Abnormal; Notable for the following components:      Result Value   Influenza A by PCR POSITIVE (*)    All other components within normal limits  URINALYSIS, ROUTINE W REFLEX MICROSCOPIC - Abnormal; Notable for the following components:   APPearance CLOUDY (*)    Hgb urine dipstick  MODERATE (*)    Protein, ur 30 (*)    Leukocytes,Ua LARGE (*)    Bacteria, UA MANY (*)    All other components within normal limits  CBC WITH DIFFERENTIAL/PLATELET - Abnormal; Notable for the following components:   WBC 11.7 (*)    Hemoglobin 11.5 (*)    RDW 17.2 (*)    Neutro Abs 9.8 (*)    All other components within normal limits  COMPREHENSIVE METABOLIC PANEL - Abnormal; Notable for the following components:   Sodium 131 (*)    Chloride 93 (*)    Glucose, Bld 105 (*)    BUN 40 (*)    Creatinine, Ser 1.17 (*)    Calcium  8.4 (*)    Albumin 2.7 (*)    AST 44 (*)    Alkaline Phosphatase 196 (*)    GFR, Estimated 46 (*)    All other components within normal limits  URINE CULTURE    EKG None  Radiology VAS US  LOWER EXTREMITY VENOUS (DVT) (ONLY [facility] & [facility]) Result Date: 07/05/2023  Lower Venous DVT Study Patient Name:  Cindy Padilla  Date of Exam:   07/04/2023 Medical Rec #: [id number]              Accession #:    [id number] Date of Birth: Aug 04, 1940              Patient Gender: F Patient Age:   65 years Exam Location:  [facility] Procedure:      VAS US  LOWER EXTREMITY VENOUS (DVT) Referring Phys: LAMAR Fredna Stricker --------------------------------------------------------------------------------  Indications: Swelling, and Edema.  Comparison Study: Previous study on 10.24.2014. Performing Technologist: Edilia Elden Appl  Examination Guidelines: A complete evaluation includes B-mode imaging, spectral Doppler, color Doppler, and power Doppler as needed of all accessible portions of each vessel. Bilateral testing is considered an integral part of a  complete examination. Limited examinations for reoccurring indications may be performed as noted. The reflux portion of the exam is performed with the patient in reverse Trendelenburg.  +-----+---------------+---------+-----------+----------+--------------+ RIGHTCompressibilityPhasicitySpontaneityPropertiesThrombus Aging  +-----+---------------+---------+-----------+----------+--------------+ CFV  Full           Yes      Yes                                 +-----+---------------+---------+-----------+----------+--------------+ SFJ  Full           Yes      Yes                                 +-----+---------------+---------+-----------+----------+--------------+   +---------+---------------+---------+-----------+----------+--------------+ LEFT     CompressibilityPhasicitySpontaneityPropertiesThrombus Aging +---------+---------------+---------+-----------+----------+--------------+ CFV      Full           Yes      Yes                                 +---------+---------------+---------+-----------+----------+--------------+ SFJ      Full           Yes      Yes                                 +---------+---------------+---------+-----------+----------+--------------+ FV Prox  Full                                                        +---------+---------------+---------+-----------+----------+--------------+ FV Mid   Full                                                        +---------+---------------+---------+-----------+----------+--------------+ FV DistalFull                                                        +---------+---------------+---------+-----------+----------+--------------+ PFV      Full                                                        +---------+---------------+---------+-----------+----------+--------------+ POP      Full           Yes      Yes                                 +---------+---------------+---------+-----------+----------+--------------+ PTV      Full                                                        +---------+---------------+---------+-----------+----------+--------------+  PERO     Full                                                         +---------+---------------+---------+-----------+----------+--------------+    Summary: RIGHT: - No evidence of common femoral vein obstruction.   LEFT: - There is no evidence of deep vein thrombosis in the lower extremity.  - No cystic structure found in the popliteal fossa.  *See table(s) above for measurements and observations. Electronically signed by Fonda Rim on 07/05/2023 at 8:19:55 AM.    Final    DG Elbow Complete Left Result Date: 07/04/2023 CLINICAL DATA:  Fall. EXAM: LEFT ELBOW - COMPLETE 3+ VIEW COMPARISON:  None Available. FINDINGS: There is no evidence of fracture, dislocation, or joint effusion. There is no evidence of arthropathy or other focal bone abnormality. Soft tissues are unremarkable. IMPRESSION: Negative. Electronically Signed   By: Franky Crease M.D.   On: 07/04/2023 18:07   DG Tibia/Fibula Left Result Date: 07/04/2023 CLINICAL DATA:  Fall. EXAM: LEFT TIBIA AND FIBULA - 2 VIEW COMPARISON:  None Available. FINDINGS: Soft tissue swelling noted along the anterior mid calf. No acute bony abnormality. Specifically, no fracture, subluxation, or dislocation. IMPRESSION: No acute bony abnormality. Electronically Signed   By: Franky Crease M.D.   On: 07/04/2023 18:06   DG Pelvis Portable Result Date: 07/04/2023 CLINICAL DATA:  Unwitnessed fall. EXAM: PORTABLE PELVIS 1-2 VIEWS COMPARISON:  None Available. FINDINGS: Hip joints and SI joints symmetric. No acute bony abnormality. Specifically, no fracture, subluxation, or dislocation. IMPRESSION: No acute bony abnormality. Electronically Signed   By: Franky Crease M.D.   On: 07/04/2023 18:06   DG Chest 2 View Result Date: 07/04/2023 CLINICAL DATA:  Fall EXAM: CHEST - 2 VIEW COMPARISON:  05/26/2023 FINDINGS: Stable mild elevation of the right hemidiaphragm. Heart and mediastinal contours are within normal limits. No focal opacities or effusions. No acute bony abnormality. Left chest wall battery pack again noted, unchanged. IMPRESSION: No active  cardiopulmonary disease. Electronically Signed   By: Franky Crease M.D.   On: 07/04/2023 18:05   CT Cervical Spine Wo Contrast Result Date: 07/04/2023 CLINICAL DATA:  Neck trauma.  Unwitnessed fall. EXAM: CT CERVICAL SPINE WITHOUT CONTRAST TECHNIQUE: Multidetector CT imaging of the cervical spine was performed without intravenous contrast. Multiplanar CT image reconstructions were also generated. RADIATION DOSE REDUCTION: This exam was performed according to the departmental dose-optimization program which includes automated exposure control, adjustment of the mA and/or kV according to patient size and/or use of iterative reconstruction technique. COMPARISON:  CT of the cervical spine 05/22/2023 FINDINGS: Alignment: Grade 1 degenerative anterolisthesis at C3-4 is stable. No other significant listhesis is present. Straightening of the normal cervical lordosis is stable. Skull base and vertebrae: The craniocervical junction is normal. Vertebral body heights are normal. No acute or healing fractures are present. Soft tissues and spinal canal: No prevertebral fluid or swelling. No visible canal hematoma. Atherosclerotic calcifications are present within the carotid bifurcations bilaterally without definite stenosis. Stimulator wires are noted in the left neck. Disc levels: Multilevel degenerative changes in the cervical spine are stable. Upper chest: The lung apices are clear. The thoracic inlet is within normal limits. IMPRESSION: 1. No acute or healing fractures of the cervical spine. 2. Stable multilevel degenerative changes in the cervical spine. 3. Atherosclerotic calcifications within  the carotid bifurcations bilaterally without definite stenosis. Electronically Signed   By: Lonni Necessary M.D.   On: 07/04/2023 17:29   CT Head Wo Contrast Result Date: 07/04/2023 CLINICAL DATA:  Unwitnessed fall. Contusion to the left lower extremity. Dementia. EXAM: CT HEAD WITHOUT CONTRAST TECHNIQUE: Contiguous axial  images were obtained from the base of the skull through the vertex without intravenous contrast. RADIATION DOSE REDUCTION: This exam was performed according to the departmental dose-optimization program which includes automated exposure control, adjustment of the mA and/or kV according to patient size and/or use of iterative reconstruction technique. COMPARISON:  None Available. FINDINGS: Brain: Moderate atrophy and white matter changes are present bilaterally. Deep brain nuclei are within normal limits. The ventricles are of normal size. No significant extraaxial fluid collection is present. The brainstem and cerebellum are within normal limits. Midline structures are within normal limits. Vascular: Atherosclerotic calcifications are present within the cavernous internal carotid arteries bilaterally. No hyperdense vessel is present. Skull: Calvarium is intact. No focal lytic or blastic lesions are present. No significant extracranial soft tissue lesion is present. Sinuses/Orbits: The paranasal sinuses and mastoid air cells are clear. The globes and orbits are within normal limits. IMPRESSION: 1. No acute intracranial abnormality or significant traumatic injury. 2. Moderate atrophy and white matter disease likely reflects the sequela of chronic microvascular ischemia. Electronically Signed   By: Lonni Necessary M.D.   On: 07/04/2023 17:26    Procedures Procedures    Medications Ordered in ED Medications  Tdap (BOOSTRIX) injection 0.5 mL (0.5 mLs Intramuscular Given 07/04/23 1914)  cefTRIAXone  (ROCEPHIN ) 1 g in sodium chloride  0.9 % 100 mL IVPB (0 g Intravenous Stopped 07/04/23 2142)    ED Course/ Medical Decision Making/ A&P Clinical Course as of 07/05/23 0941  Fri Jul 04, 2023  1725 Signed out to Dr Lenor [RP]    Clinical Course User Index [RP] Yolande Lamar BROCKS, MD                                 Medical Decision Making Amount and/or Complexity of Data Reviewed Labs: ordered. Radiology:  ordered.  Risk Prescription drug management.   LAN ENTSMINGER is a 83 y.o. female with comorbidities that complicate the patient evaluation including COPD, dementia, and HTN who presents after fall and with worsening generalized weakness over the past 2 weeks  Initial Ddx:  Mechanical fall, TBI, C-spine injury, left lower extremity fracture, left elbow fracture, AKI, UTI, pneumonia, URI, generalized deconditioning  MDM/Course:  Patient presents emergency department after an unwitnessed fall.  After talking to her facility it appears that since being discharged from the hospital she has had a gradual decline in has been more generally weak than usual.  I suspect that this is due to deconditioning from being in the hospital but could also have another infection at this time that is limiting her recovery.  Ordered x-rays as well as a CT of the head and C-spine to evaluate for injury or fracture.  She also had an ultrasound of her left lower extremity which they were attempting to perform as an outpatient to rule out DVT given the swelling of her leg.  She has a COVID and flu, blood work, and urinalysis that is pending at this time.  Upon re-evaluation was stable.  Signed out to the oncoming physician awaiting results of her imaging and lab work.  This patient presents to the ED for concern of  complaints listed in HPI, this involves an extensive number of treatment options, and is a complaint that carries with it a high risk of complications and morbidity. Disposition including potential need for admission considered.   Dispo: Pending remainder of workup  Additional history obtained from Nursing Home/Care Facility Records reviewed Outpatient Clinic Notes I have reviewed the patients home medications and made adjustments as needed Social Determinants of health:  SNF resident, elderly  Portions of this note were generated with Scientist, clinical (histocompatibility and immunogenetics). Dictation errors may occur despite  best attempts at proofreading.     Final Clinical Impression(s) / ED Diagnoses Final diagnoses:  Acute cystitis without hematuria  Influenza  Fall, initial encounter  Localized swelling of left lower extremity  Left elbow pain    Rx / DC Orders ED Discharge Orders          Ordered    cephALEXin  (KEFLEX ) 500 MG capsule  2 times daily        07/04/23 2319              Yolande Lamar BROCKS, MD 07/05/23 [id number]

## 2023-07-04 NOTE — ED Triage Notes (Signed)
 Pt arrives by EMS from [facility] after an unwitnessed fall. Pt has oozing contusion to left lower leg, does not complain of pain anywhere else. Hx dementia, per EMS/nursing home staff pt is acting at baseline. EMS placed pt in c-collar.

## 2023-07-04 NOTE — ED Notes (Signed)
 Nurse Sherrine Dolly from [facility] called for update.

## 2023-07-04 NOTE — ED Notes (Signed)
 Report called to nurse at [facility]

## 2023-07-04 NOTE — ED Provider Notes (Signed)
 Care was taken over from Dr. Jakie.  Patient had an unwitnessed fall.  She has a hematoma to her left lower leg.  There is a little bit of oozing but it does not appear to be infected.  She had CT scans of her head and cervical spine which showed no acute abnormality.  Extremity x-rays did not show any acute fractures.  She reportedly has some increased weakness recently.  Workup here reveals that she is positive for influenza.  She also has evidence of UTI.  She was given dose of IV Rocephin .  Her symptoms seem to have been going on for a while and she appears to be out of the window for Tamiflu.  She otherwise is well-appearing.  She is alert and interactive.  She has no hypoxia.  Her chest x-ray does not show any evidence of pneumonia.  Her vital signs have been stable.  She appears appropriate for discharge.  She was discharged back to nursing facility.  She was given a prescription for Keflex .   Lenor Hollering, MD 07/04/23 [phone]

## 2023-07-06 LAB — URINE CULTURE: Culture: >=100000 — AB

## 2023-07-07 ENCOUNTER — Telehealth (HOSPITAL_BASED_OUTPATIENT_CLINIC_OR_DEPARTMENT_OTHER): Payer: Self-pay | Admitting: *Deleted

## 2023-07-07 NOTE — Progress Notes (Signed)
 ED Antimicrobial Stewardship Positive Culture Follow Up   Cindy Padilla is an 83 y.o. female who presented to [facility] on 07/04/2023 with a chief complaint of  Chief Complaint  Patient presents with   Fall    Recent Results (from the past 720 hours)  Resp panel by RT-PCR (RSV, Flu A&B, Covid) Anterior Nasal Swab     Status: Abnormal   Collection Time: 07/04/23  4:53 PM   Specimen: Anterior Nasal Swab  Result Value Ref Range Status   SARS Coronavirus 2 by RT PCR NEGATIVE NEGATIVE Final    Comment: (NOTE) SARS-CoV-2 target nucleic acids are NOT DETECTED.  The SARS-CoV-2 RNA is generally detectable in upper respiratory specimens during the acute phase of infection. The lowest concentration of SARS-CoV-2 viral copies this assay can detect is 138 copies/mL. A negative result does not preclude SARS-Cov-2 infection and should not be used as the sole basis for treatment or other patient management decisions. A negative result may occur with  improper specimen collection/handling, submission of specimen other than nasopharyngeal swab, presence of viral mutation(s) within the areas targeted by this assay, and inadequate number of viral copies(<138 copies/mL). A negative result must be combined with clinical observations, patient history, and epidemiological information. The expected result is Negative.  Fact Sheet for Patients:  bloggercourse.com  Fact Sheet for Healthcare Providers:  seriousbroker.it  This test is no t yet approved or cleared by the [place]  FDA and  has been authorized for detection and/or diagnosis of SARS-CoV-2 by FDA under an Emergency Use Authorization (EUA). This EUA will remain  in effect (meaning this test can be used) for the duration of the COVID-19 declaration under Section 564(b)(1) of the Act, 21 U.S.C.section 360bbb-3(b)(1), unless the authorization is terminated  or revoked sooner.        Influenza A by PCR POSITIVE (A) NEGATIVE Final   Influenza B by PCR NEGATIVE NEGATIVE Final    Comment: (NOTE) The Xpert Xpress SARS-CoV-2/FLU/RSV plus assay is intended as an aid in the diagnosis of influenza from Nasopharyngeal swab specimens and should not be used as a sole basis for treatment. Nasal washings and aspirates are unacceptable for Xpert Xpress SARS-CoV-2/FLU/RSV testing.  Fact Sheet for Patients: bloggercourse.com  Fact Sheet for Healthcare Providers: seriousbroker.it  This test is not yet approved or cleared by the [place]  FDA and has been authorized for detection and/or diagnosis of SARS-CoV-2 by FDA under an Emergency Use Authorization (EUA). This EUA will remain in effect (meaning this test can be used) for the duration of the COVID-19 declaration under Section 564(b)(1) of the Act, 21 U.S.C. section 360bbb-3(b)(1), unless the authorization is terminated or revoked.     Resp Syncytial Virus by PCR NEGATIVE NEGATIVE Final    Comment: (NOTE) Fact Sheet for Patients: bloggercourse.com  Fact Sheet for Healthcare Providers: seriousbroker.it  This test is not yet approved or cleared by the [place]  FDA and has been authorized for detection and/or diagnosis of SARS-CoV-2 by FDA under an Emergency Use Authorization (EUA). This EUA will remain in effect (meaning this test can be used) for the duration of the COVID-19 declaration under Section 564(b)(1) of the Act, 21 U.S.C. section 360bbb-3(b)(1), unless the authorization is terminated or revoked.  Performed at [facility], [address], [place], [place] [address]   Urine Culture     Status: Abnormal   Collection Time: 07/04/23  7:54 PM   Specimen: Urine, Clean Catch  Result Value Ref Range Status  Specimen Description   Final    URINE, CLEAN CATCH Performed at [facility], [address] [place], [place] [address]    Special Requests   Final    NONE Performed at [facility], [address], [place], [place] [address]    Culture (A)  Final    >=100,000 COLONIES/mL ESCHERICHIA COLI Confirmed Extended Spectrum Beta-Lactamase Producer (ESBL).  In bloodstream infections from ESBL organisms, carbapenems are preferred over piperacillin/tazobactam. They are shown to have a lower risk of mortality.    Report Status 07/06/2023 FINAL  Final   Organism ID, Bacteria ESCHERICHIA COLI (A)  Final      Susceptibility   Escherichia coli - MIC*    AMPICILLIN >=32 RESISTANT Resistant     CEFAZOLIN >=64 RESISTANT Resistant     CEFEPIME 4 INTERMEDIATE Intermediate     CEFTRIAXONE  32 RESISTANT Resistant     CIPROFLOXACIN >=4 RESISTANT Resistant     GENTAMICIN <=1 SENSITIVE Sensitive     IMIPENEM <=0.25 SENSITIVE Sensitive     NITROFURANTOIN  <=16 SENSITIVE Sensitive     TRIMETH/SULFA >=320 RESISTANT Resistant     AMPICILLIN/SULBACTAM 4 SENSITIVE Sensitive     PIP/TAZO <=4 SENSITIVE Sensitive ug/mL    * >=100,000 COLONIES/mL ESCHERICHIA COLI    [x]  Treated with cephalexin , organism resistant to prescribed antimicrobial []  Patient discharged originally without antimicrobial agent and treatment is now indicated  New antibiotic prescription: Stop cephalexin . Give fosfomycin 3 gm x 1 dose   ED Provider: Lonni Lites, PA-C  Damien Quiet, PharmD, BCPS, BCIDP Infectious Diseases Clinical Pharmacist Phone: [phone] 07/07/2023, 8:30 AM

## 2023-07-07 NOTE — Telephone Encounter (Signed)
 Post ED Visit - Positive Culture Follow-up: Successful Patient Follow-Up  Culture assessed and recommendations reviewed by:  []  Court Distance, Pharm.D. []  Skeet Duke, Pharm.D., BCPS AQ-ID []  Leslee Rase, Pharm.D., BCPS []  Garland Junk, [address].D., BCPS []  [place], [place].D., BCPS, AAHIVP []  Alcide Aly, Pharm.D., BCPS, AAHIVP []  Jerri Morale, PharmD, BCPS []  Graham Laws, PharmD, BCPS []  Cleda Curly, PharmD, BCPS [x]  Denson Flake, PharmD  Positive urine culture  []  Patient discharged without antimicrobial prescription and treatment is now indicated [x]  Organism is resistant to prescribed ED discharge antimicrobial []  Patient with positive blood cultures  Changes discussed with ED provider: Spero Dye New antibiotic prescription Fosfomycin 3g x 1 Faxed culture report to [facility] facility, date 07/07/23, time 0906   Georgine Kitchens 07/07/2023, 9:03 AM

## 2023-07-15 ENCOUNTER — Emergency Department (HOSPITAL_COMMUNITY): Payer: Medicare Other

## 2023-07-15 ENCOUNTER — Other Ambulatory Visit: Payer: Self-pay

## 2023-07-15 ENCOUNTER — Encounter (HOSPITAL_COMMUNITY): Payer: Self-pay

## 2023-07-15 ENCOUNTER — Inpatient Hospital Stay (HOSPITAL_COMMUNITY)
Admission: EM | Admit: 2023-07-15 | Discharge: 2023-07-29 | DRG: 871 | Disposition: A | Discharge status: Other Acute Inpatient Hospital | Payer: Medicare Other | Attending: Internal Medicine | Admitting: Internal Medicine

## 2023-07-15 DIAGNOSIS — Z87891 Personal history of nicotine dependence: Secondary | ICD-10-CM

## 2023-07-15 DIAGNOSIS — K264 Chronic or unspecified duodenal ulcer with hemorrhage: Secondary | ICD-10-CM | POA: Diagnosis present

## 2023-07-15 DIAGNOSIS — D539 Nutritional anemia, unspecified: Secondary | ICD-10-CM | POA: Diagnosis present

## 2023-07-15 DIAGNOSIS — Z7902 Long term (current) use of antithrombotics/antiplatelets: Secondary | ICD-10-CM

## 2023-07-15 DIAGNOSIS — Z1619 Resistance to other specified beta lactam antibiotics: Secondary | ICD-10-CM | POA: Diagnosis present

## 2023-07-15 DIAGNOSIS — R1310 Dysphagia, unspecified: Secondary | ICD-10-CM | POA: Diagnosis present

## 2023-07-15 DIAGNOSIS — K449 Diaphragmatic hernia without obstruction or gangrene: Secondary | ICD-10-CM | POA: Diagnosis present

## 2023-07-15 DIAGNOSIS — Z1612 Extended spectrum beta lactamase (ESBL) resistance: Secondary | ICD-10-CM | POA: Diagnosis present

## 2023-07-15 DIAGNOSIS — E785 Hyperlipidemia, unspecified: Secondary | ICD-10-CM | POA: Diagnosis present

## 2023-07-15 DIAGNOSIS — R0902 Hypoxemia: Secondary | ICD-10-CM | POA: Diagnosis present

## 2023-07-15 DIAGNOSIS — E875 Hyperkalemia: Principal | ICD-10-CM | POA: Diagnosis present

## 2023-07-15 DIAGNOSIS — Z23 Encounter for immunization: Secondary | ICD-10-CM

## 2023-07-15 DIAGNOSIS — A419 Sepsis, unspecified organism: Secondary | ICD-10-CM | POA: Diagnosis not present

## 2023-07-15 DIAGNOSIS — E876 Hypokalemia: Secondary | ICD-10-CM | POA: Diagnosis present

## 2023-07-15 DIAGNOSIS — B9620 Unspecified Escherichia coli [E. coli] as the cause of diseases classified elsewhere: Secondary | ICD-10-CM | POA: Diagnosis present

## 2023-07-15 DIAGNOSIS — Z993 Dependence on wheelchair: Secondary | ICD-10-CM

## 2023-07-15 DIAGNOSIS — R6520 Severe sepsis without septic shock: Secondary | ICD-10-CM | POA: Diagnosis present

## 2023-07-15 DIAGNOSIS — N179 Acute kidney failure, unspecified: Secondary | ICD-10-CM | POA: Diagnosis present

## 2023-07-15 DIAGNOSIS — I350 Nonrheumatic aortic (valve) stenosis: Secondary | ICD-10-CM | POA: Diagnosis present

## 2023-07-15 DIAGNOSIS — J90 Pleural effusion, not elsewhere classified: Secondary | ICD-10-CM | POA: Diagnosis present

## 2023-07-15 DIAGNOSIS — G9341 Metabolic encephalopathy: Secondary | ICD-10-CM | POA: Diagnosis present

## 2023-07-15 DIAGNOSIS — R5383 Other fatigue: Secondary | ICD-10-CM | POA: Diagnosis not present

## 2023-07-15 DIAGNOSIS — L03116 Cellulitis of left lower limb: Secondary | ICD-10-CM | POA: Diagnosis present

## 2023-07-15 DIAGNOSIS — I2510 Atherosclerotic heart disease of native coronary artery without angina pectoris: Secondary | ICD-10-CM | POA: Diagnosis present

## 2023-07-15 DIAGNOSIS — J440 Chronic obstructive pulmonary disease with acute lower respiratory infection: Secondary | ICD-10-CM | POA: Diagnosis present

## 2023-07-15 DIAGNOSIS — Z66 Do not resuscitate: Secondary | ICD-10-CM | POA: Diagnosis present

## 2023-07-15 DIAGNOSIS — E872 Acidosis, unspecified: Secondary | ICD-10-CM | POA: Diagnosis present

## 2023-07-15 DIAGNOSIS — D62 Acute posthemorrhagic anemia: Secondary | ICD-10-CM | POA: Diagnosis present

## 2023-07-15 DIAGNOSIS — D7589 Other specified diseases of blood and blood-forming organs: Secondary | ICD-10-CM | POA: Diagnosis present

## 2023-07-15 DIAGNOSIS — R269 Unspecified abnormalities of gait and mobility: Secondary | ICD-10-CM | POA: Diagnosis present

## 2023-07-15 DIAGNOSIS — Z1152 Encounter for screening for COVID-19: Secondary | ICD-10-CM

## 2023-07-15 DIAGNOSIS — E8809 Other disorders of plasma-protein metabolism, not elsewhere classified: Secondary | ICD-10-CM | POA: Diagnosis present

## 2023-07-15 DIAGNOSIS — J159 Unspecified bacterial pneumonia: Secondary | ICD-10-CM | POA: Diagnosis not present

## 2023-07-15 DIAGNOSIS — Z79899 Other long term (current) drug therapy: Secondary | ICD-10-CM

## 2023-07-15 DIAGNOSIS — J101 Influenza due to other identified influenza virus with other respiratory manifestations: Secondary | ICD-10-CM

## 2023-07-15 DIAGNOSIS — L03115 Cellulitis of right lower limb: Secondary | ICD-10-CM | POA: Diagnosis present

## 2023-07-15 DIAGNOSIS — R7989 Other specified abnormal findings of blood chemistry: Secondary | ICD-10-CM

## 2023-07-15 DIAGNOSIS — K269 Duodenal ulcer, unspecified as acute or chronic, without hemorrhage or perforation: Secondary | ICD-10-CM

## 2023-07-15 DIAGNOSIS — N390 Urinary tract infection, site not specified: Secondary | ICD-10-CM | POA: Diagnosis present

## 2023-07-15 DIAGNOSIS — R627 Adult failure to thrive: Secondary | ICD-10-CM | POA: Diagnosis present

## 2023-07-15 DIAGNOSIS — J1008 Influenza due to other identified influenza virus with other specified pneumonia: Secondary | ICD-10-CM | POA: Diagnosis present

## 2023-07-15 DIAGNOSIS — I10 Essential (primary) hypertension: Secondary | ICD-10-CM | POA: Diagnosis present

## 2023-07-15 DIAGNOSIS — R64 Cachexia: Secondary | ICD-10-CM | POA: Diagnosis present

## 2023-07-15 DIAGNOSIS — F0393 Unspecified dementia, unspecified severity, with mood disturbance: Secondary | ICD-10-CM | POA: Diagnosis present

## 2023-07-15 HISTORY — DX: Presence of other specified functional implants: Z96.89

## 2023-07-15 LAB — URINALYSIS, ROUTINE W REFLEX MICROSCOPIC
Bilirubin Urine: NEGATIVE
Glucose, UA: NEGATIVE mg/dL
Hgb urine dipstick: NEGATIVE
Ketones, ur: 5 mg/dL — AB
Nitrite: NEGATIVE
Protein, ur: NEGATIVE mg/dL
Specific Gravity, Urine: 1.016 (ref 1.005–1.030)
WBC, UA: >50 WBC/hpf (ref 0–5)
pH: 6 (ref 5.0–8.0)

## 2023-07-15 LAB — COMPREHENSIVE METABOLIC PANEL
ALT: 23 U/L (ref 0–44)
AST: 37 U/L (ref 15–41)
Albumin: 2.9 g/dL — ABNORMAL LOW (ref 3.5–5.0)
Alkaline Phosphatase: 165 U/L — ABNORMAL HIGH (ref 38–126)
Anion gap: 12 (ref 5–15)
BUN: 52 mg/dL — ABNORMAL HIGH (ref 8–23)
CO2: 20 mmol/L — ABNORMAL LOW (ref 22–32)
Calcium: 8.7 mg/dL — ABNORMAL LOW (ref 8.9–10.3)
Chloride: 98 mmol/L (ref 98–111)
Creatinine, Ser: 1.6 mg/dL — ABNORMAL HIGH (ref 0.44–1.00)
GFR, Estimated: 32 mL/min — ABNORMAL LOW (ref >60)
Glucose, Bld: 106 mg/dL — ABNORMAL HIGH (ref 70–99)
Potassium: 7.3 mmol/L (ref 3.5–5.1)
Sodium: 130 mmol/L — ABNORMAL LOW (ref 135–145)
Total Bilirubin: 1.1 mg/dL (ref 0.0–1.2)
Total Protein: 7.1 g/dL (ref 6.5–8.1)

## 2023-07-15 LAB — CBC WITH DIFFERENTIAL/PLATELET
Abs Immature Granulocytes: 0.16 10*3/uL — ABNORMAL HIGH (ref 0.00–0.07)
Basophils Absolute: 0 10*3/uL (ref 0.0–0.1)
Basophils Relative: 0 %
Eosinophils Absolute: 0 10*3/uL (ref 0.0–0.5)
Eosinophils Relative: 0 %
HCT: 35.9 % — ABNORMAL LOW (ref 36.0–46.0)
Hemoglobin: 11.1 g/dL — ABNORMAL LOW (ref 12.0–15.0)
Immature Granulocytes: 1 %
Lymphocytes Relative: 11 %
Lymphs Abs: 1.3 10*3/uL (ref 0.7–4.0)
MCH: 29.7 pg (ref 26.0–34.0)
MCHC: 30.9 g/dL (ref 30.0–36.0)
MCV: 96 fL (ref 80.0–100.0)
Monocytes Absolute: 0.6 10*3/uL (ref 0.1–1.0)
Monocytes Relative: 5 %
Neutro Abs: 9.1 10*3/uL — ABNORMAL HIGH (ref 1.7–7.7)
Neutrophils Relative %: 83 %
Platelets: 463 10*3/uL — ABNORMAL HIGH (ref 150–400)
RBC: 3.74 MIL/uL — ABNORMAL LOW (ref 3.87–5.11)
RDW: 19.8 % — ABNORMAL HIGH (ref 11.5–15.5)
WBC: 11.1 10*3/uL — ABNORMAL HIGH (ref 4.0–10.5)
nRBC: 0.9 % — ABNORMAL HIGH (ref 0.0–0.2)

## 2023-07-15 LAB — BASIC METABOLIC PANEL
Anion gap: 9 (ref 5–15)
BUN: 52 mg/dL — ABNORMAL HIGH (ref 8–23)
CO2: 21 mmol/L — ABNORMAL LOW (ref 22–32)
Calcium: 8.4 mg/dL — ABNORMAL LOW (ref 8.9–10.3)
Chloride: 102 mmol/L (ref 98–111)
Creatinine, Ser: 1.55 mg/dL — ABNORMAL HIGH (ref 0.44–1.00)
GFR, Estimated: 33 mL/min — ABNORMAL LOW (ref >60)
Glucose, Bld: 107 mg/dL — ABNORMAL HIGH (ref 70–99)
Potassium: 6.4 mmol/L (ref 3.5–5.1)
Sodium: 132 mmol/L — ABNORMAL LOW (ref 135–145)

## 2023-07-15 LAB — I-STAT CG4 LACTIC ACID, ED
Lactic Acid, Venous: 2.7 mmol/L (ref 0.5–1.9)
Lactic Acid, Venous: 3.1 mmol/L (ref 0.5–1.9)

## 2023-07-15 LAB — RESP PANEL BY RT-PCR (RSV, FLU A&B, COVID)  RVPGX2
Influenza A by PCR: POSITIVE — AB
Influenza B by PCR: NEGATIVE
Resp Syncytial Virus by PCR: NEGATIVE
SARS Coronavirus 2 by RT PCR: NEGATIVE

## 2023-07-15 LAB — CBG MONITORING, ED: Glucose-Capillary: 106 mg/dL — ABNORMAL HIGH (ref 70–99)

## 2023-07-15 LAB — TROPONIN I (HIGH SENSITIVITY)
Troponin I (High Sensitivity): 37 ng/L — ABNORMAL HIGH (ref <18)
Troponin I (High Sensitivity): 30 ng/L — ABNORMAL HIGH (ref <18)

## 2023-07-15 LAB — BRAIN NATRIURETIC PEPTIDE: B Natriuretic Peptide: 163.9 pg/mL — ABNORMAL HIGH (ref 0.0–100.0)

## 2023-07-15 MED ORDER — ACETAMINOPHEN 325 MG PO TABS
650.0000 mg | ORAL_TABLET | Freq: Four times a day (QID) | ORAL | Status: DC | PRN
  Administered 2023-07-19 – 2023-07-24 (×3): 650 mg via ORAL
  Filled 2023-07-15 (×4): qty 2

## 2023-07-15 MED ORDER — ATORVASTATIN CALCIUM 20 MG PO TABS
20.0000 mg | ORAL_TABLET | Freq: Every evening | ORAL | Status: DC
  Administered 2023-07-15 – 2023-07-28 (×9): 20 mg via ORAL
  Filled 2023-07-15 (×3): qty 1
  Filled 2023-07-15: qty 2
  Filled 2023-07-15 (×4): qty 1
  Filled 2023-07-15: qty 2
  Filled 2023-07-15 (×3): qty 1

## 2023-07-15 MED ORDER — SODIUM ZIRCONIUM CYCLOSILICATE 10 G PO PACK: 10.0000 g | PACK | Freq: Two times a day (BID) | ORAL | Status: DC

## 2023-07-15 MED ORDER — QUETIAPINE FUMARATE 50 MG PO TABS
50.0000 mg | ORAL_TABLET | Freq: Every day | ORAL | Status: DC
  Administered 2023-07-17 – 2023-07-23 (×5): 50 mg via ORAL
  Filled 2023-07-15 (×8): qty 1

## 2023-07-15 MED ORDER — SODIUM CHLORIDE 0.9 % IV SOLN
1.0000 g | INTRAVENOUS | Status: AC
  Administered 2023-07-15 – 2023-07-19 (×5): 1 g via INTRAVENOUS
  Filled 2023-07-15 (×6): qty 1000

## 2023-07-15 MED ORDER — ONDANSETRON HCL 4 MG/2ML IJ SOLN
4.0000 mg | Freq: Four times a day (QID) | INTRAMUSCULAR | Status: DC | PRN
  Administered 2023-07-24 – 2023-07-26 (×2): 4 mg via INTRAVENOUS
  Filled 2023-07-15 (×2): qty 2

## 2023-07-15 MED ORDER — DONEPEZIL HCL 10 MG PO TABS
10.0000 mg | ORAL_TABLET | Freq: Every day | ORAL | Status: DC
  Administered 2023-07-15 – 2023-07-28 (×13): 10 mg via ORAL
  Filled 2023-07-15 (×5): qty 1
  Filled 2023-07-15: qty 2
  Filled 2023-07-15 (×7): qty 1

## 2023-07-15 MED ORDER — CLOPIDOGREL BISULFATE 75 MG PO TABS
75.0000 mg | ORAL_TABLET | Freq: Every day | ORAL | Status: DC
  Administered 2023-07-17 – 2023-07-18 (×2): 75 mg via ORAL
  Filled 2023-07-15 (×4): qty 1

## 2023-07-15 MED ORDER — LORAZEPAM 0.5 MG PO TABS
0.5000 mg | ORAL_TABLET | Freq: Two times a day (BID) | ORAL | Status: DC
  Administered 2023-07-15 – 2023-07-21 (×9): 0.5 mg via ORAL
  Filled 2023-07-15 (×12): qty 1

## 2023-07-15 MED ORDER — QUETIAPINE FUMARATE 50 MG PO TABS
100.0000 mg | ORAL_TABLET | Freq: Every day | ORAL | Status: DC
  Administered 2023-07-15 – 2023-07-23 (×8): 100 mg via ORAL
  Filled 2023-07-15 (×9): qty 2

## 2023-07-15 MED ORDER — LACTATED RINGERS IV BOLUS
500.0000 mL | Freq: Once | INTRAVENOUS | Status: AC
  Administered 2023-07-15: 500 mL via INTRAVENOUS

## 2023-07-15 MED ORDER — SODIUM CHLORIDE 0.9 % IV BOLUS
500.0000 mL | Freq: Once | INTRAVENOUS | Status: AC
  Administered 2023-07-15: 500 mL via INTRAVENOUS

## 2023-07-15 MED ORDER — SODIUM ZIRCONIUM CYCLOSILICATE 10 G PO PACK
10.0000 g | PACK | Freq: Once | ORAL | Status: AC
  Administered 2023-07-15: 10 g via ORAL
  Filled 2023-07-15: qty 1

## 2023-07-15 MED ORDER — LACTATED RINGERS IV SOLN: INTRAVENOUS | Status: AC

## 2023-07-15 MED ORDER — ACETAMINOPHEN 650 MG RE SUPP: 650.0000 mg | Freq: Four times a day (QID) | RECTAL | Status: DC | PRN

## 2023-07-15 MED ORDER — AMLODIPINE BESYLATE 5 MG PO TABS
5.0000 mg | ORAL_TABLET | Freq: Every day | ORAL | Status: DC
  Administered 2023-07-17 – 2023-07-28 (×9): 5 mg via ORAL
  Filled 2023-07-15 (×14): qty 1

## 2023-07-15 MED ORDER — LAMOTRIGINE 100 MG PO TABS
200.0000 mg | ORAL_TABLET | Freq: Two times a day (BID) | ORAL | Status: DC
  Administered 2023-07-15 – 2023-07-23 (×12): 200 mg via ORAL
  Filled 2023-07-15 (×16): qty 2

## 2023-07-15 MED ORDER — ERTAPENEM SODIUM 1 G IJ SOLR
1.0000 g | INTRAMUSCULAR | Status: DC
  Filled 2023-07-15: qty 1000

## 2023-07-15 MED ORDER — SODIUM CHLORIDE 0.9 % IV SOLN
1.0000 g | Freq: Once | INTRAVENOUS | Status: AC
  Administered 2023-07-15: 1 g via INTRAVENOUS
  Filled 2023-07-15: qty 10

## 2023-07-15 MED ORDER — ONDANSETRON HCL 4 MG PO TABS
4.0000 mg | ORAL_TABLET | Freq: Four times a day (QID) | ORAL | Status: DC | PRN
Start: 2023-07-15 — End: 2023-07-29

## 2023-07-15 MED ORDER — ENOXAPARIN SODIUM 30 MG/0.3ML IJ SOSY
30.0000 mg | PREFILLED_SYRINGE | INTRAMUSCULAR | Status: DC
  Administered 2023-07-15: 30 mg via SUBCUTANEOUS
  Filled 2023-07-15: qty 0.3

## 2023-07-15 MED ORDER — OXYCODONE HCL 5 MG PO TABS
5.0000 mg | ORAL_TABLET | ORAL | Status: DC | PRN
  Administered 2023-07-15: 5 mg via ORAL
  Filled 2023-07-15: qty 1

## 2023-07-15 NOTE — H&P (Signed)
 History and Physical    Cindy Padilla QIO:[id number] DOB: Jan 26, 1941 DOA: 07/15/2023  PCP: Valere Dross, FNP   Chief Complaint:  fatigue  HPI: Cindy Padilla is a 83 y.o. female with medical history significant of with history of hypertension, dementia, COPD who presents emergency department due to fatigue.  Patient was diagnosed with influenza a week ago as well as a UTI.  Since patient was diagnosed with influenza she has failed to improve.  Today she was developing worsening fatigue and weakness.  She is typically able to walk with a walker but became wheelchair-bound.  She was brought to the ER for further assessment.  On arrival she was afebrile and hemodynamically stable.  Labs were obtained on presentation which demonstrated respiratory viral panel negative, WBC 11.1, hemoglobin 11.1, platelets 463, potassium 7.3, sodium 130, creatinine 1.6 baseline around 1.  BNP 163.  Lactic acid 2.7, 3.1.  Urinalysis concerning for infection.  Patient underwent chest x-ray which showed no acute airspace opacities.  Patient was started on ceftriaxone admitted further workup.  She was given [facility] for hyperkalemia.  Of note urine cultures from 2/7 grew E. coli with ESBL which was resistant to ceftriaxone.  Patient was started on ERtapenem.  Review of Systems: Review of Systems  Constitutional:  Positive for malaise/fatigue. Negative for chills and fever.  HENT: Negative.    Eyes: Negative.   Respiratory: Negative.    Cardiovascular: Negative.   Gastrointestinal: Negative.   Genitourinary: Negative.   Musculoskeletal: Negative.   Skin: Negative.   Neurological: Negative.   Endo/Heme/Allergies: Negative.   Psychiatric/Behavioral: Negative.       As per HPI otherwise 10 point review of systems negative.   Allergies  Allergen Reactions   Sulfa Antibiotics Other (See Comments)    Unknown reaction  Listed as an allergy on MAR  Other Reaction(s): Other (See Comments),  Unknown    Unknown reaction Listed as an allergy on MAR   Iodine Other (See Comments)    Unknown reaction Listed as an allergy on MAR   Ms Contin [Morphine] Other (See Comments)    Unknown reaction Listed as an allergy on MAR   Aminoglycosides Other (See Comments)    Unknown reaction  Listed as an allergy on MAR   Aspirin Other (See Comments)    Unknown reaction  Listed as an allergy on MAR   Codeine Other (See Comments)    Unknown reaction Listed as an allergy on MAR   Ivp Dye [Iodinated Contrast Media] Other (See Comments)    Unknown reaction Listed as an allergy on MAR   Neomycin Other (See Comments)    Unknown reaction Listed as an allergy on MAR   Penicillins Rash and Other (See Comments)    Not listed on MAR  Has patient had a PCN reaction causing immediate rash, facial/tongue/throat swelling, SOB or lightheadedness with hypotension: No Has patient had a PCN reaction causing severe rash involving mucus membranes or skin necrosis: No Has patient had a PCN reaction that required hospitalization No Has patient had a PCN reaction occurring within the last 10 years: No If all of the above answers are "NO", then may proceed with Cephalosporin use.   Phenothiazines Other (See Comments)    Unknown reaction Listed as an allergy on MAR   Promethazine Other (See Comments)    Unknown reaction Not listed on MAR   Tetracyclines & Related Other (See Comments)    Unknown reaction Listed as an allergy on [facility]  Past Medical History:  Diagnosis Date   Arthritis    Bipolar 1 disorder (HCC)    Bronchitis    Chronic back pain    Constipation    COPD (chronic obstructive pulmonary disease) (HCC)    Dementia (HCC)    Diabetes mellitus    GERD (gastroesophageal reflux disease)    Hiatal hernia    Hypertension    IBS (irritable bowel syndrome)     Past Surgical History:  Procedure Laterality Date   ESOPHAGOGASTRODUODENOSCOPY (EGD) WITH PROPOFOL N/A 04/14/2020   Procedure:  ESOPHAGOGASTRODUODENOSCOPY (EGD) WITH PROPOFOL;  Surgeon: Napoleon Form, MD;  Location: [facility] ENDOSCOPY;  Service: Endoscopy;  Laterality: N/A;   gallbladder     MANDIBLE SURGERY     PARTIAL HYSTERECTOMY       reports that she has never smoked. She quit smokeless tobacco use about 64 years ago.  Her smokeless tobacco use included snuff. She reports that she does not drink alcohol and does not use drugs.  Family History  Problem Relation Age of Onset   Emphysema Mother    Cancer Other     Prior to Admission medications   Medication Sig Start Date End Date Taking? Authorizing Provider  acetaminophen (TYLENOL) 325 MG tablet Take 650 mg by mouth 3 (three) times daily.   Yes [provider]  albuterol (VENTOLIN HFA) 108 (90 Base) MCG/ACT inhaler Inhale 2 puffs into the lungs every 4 (four) hours as needed for wheezing or shortness of breath. 05/12/23  Yes [provider]  amLODipine (NORVASC) 5 MG tablet Take 5 mg by mouth daily.   Yes [provider]  antiseptic oral rinse (BIOTENE) LIQD 10 mLs by Mouth Rinse route at bedtime.   Yes [provider]  atorvastatin (LIPITOR) 20 MG tablet Take 20 mg by mouth every evening.   Yes [provider]  benzonatate (TESSALON) 100 MG capsule Take 1 capsule (100 mg total) by mouth 3 (three) times daily as needed for cough. 05/28/23  Yes Regalado, Belkys A, MD  BREO ELLIPTA 200-25 MCG/ACT AEPB Inhale 1 puff into the lungs daily. 04/21/23  Yes [provider]  Cholecalciferol (VITAMIN D) 50 MCG (2000 UT) tablet Take 2,000 Units by mouth daily.    Yes [provider]  clopidogrel (PLAVIX) 75 MG tablet Take 1 tablet (75 mg total) by mouth daily. 04/20/20  Yes Almon Hercules, MD  cyanocobalamin (VITAMIN B12) 500 MCG tablet Take 1 tablet (500 mcg total) by mouth daily. 05/28/23  Yes Regalado, Belkys A, MD  diclofenac Sodium (VOLTAREN) 1 % GEL Apply 2 g topically 4 (four) times daily as needed (Pain).    Yes [provider]  donepezil (ARICEPT) 10 MG tablet Take 10 mg by mouth at bedtime.   Yes [provider]  ferrous sulfate 325 (65 FE) MG tablet Take 1 tablet (325 mg total) by mouth 2 (two) times daily with a meal. Patient taking differently: Take 325 mg by mouth daily. 04/17/20  Yes Almon Hercules, MD  fluticasone (FLONASE) 50 MCG/ACT nasal spray Place 2 sprays into both nostrils daily.    Yes [provider]  guaiFENesin (MUCINEX) 600 MG 12 hr tablet Take 2 tablets (1,200 mg total) by mouth 2 (two) times daily. 05/28/23  Yes Regalado, Belkys A, MD  hydrocortisone 2.5 % cream Apply 1 Application topically daily as needed (Irritation). 05/12/23  Yes [provider]  INCRUSE ELLIPTA 62.5 MCG/INH AEPB Inhale 1 puff into the lungs daily.  04/17/19  Yes [provider]  ipratropium-albuterol (DUONEB) 0.5-2.5 (3) MG/3ML SOLN Take 3 mLs by nebulization 3 (three) times daily. 05/28/23  Yes Regalado, Belkys A, MD  lamoTRIgine (LAMICTAL) 200 MG tablet Take 200 mg by mouth 2 (two) times daily.   Yes [provider]  LORazepam (ATIVAN) 0.5 MG tablet Take 0.5 mg by mouth 2 (two) times daily. 06/28/23  Yes [provider]  magnesium oxide (MAG-OX) 400 (241.3 Mg) MG tablet Take 400 mg by mouth daily.   Yes [provider]  montelukast (SINGULAIR) 10 MG tablet Take 10 mg by mouth at bedtime.   Yes [provider]  [facility] powder Apply 1 Application topically 2 (two) times daily as needed (Rash). 05/12/23  Yes [provider]  polyethylene glycol (MIRALAX / GLYCOLAX) 17 g packet Take 17 g by mouth 2 (two) times daily as needed for moderate constipation.   Yes [provider]  potassium chloride SA (KLOR-CON M) 20 MEQ tablet Take 20 mEq by mouth daily. 04/21/23  Yes [provider]  QUEtiapine (SEROQUEL) 50 MG tablet Take 50-100 mg by mouth See admin instructions. Take 50mg  (1 tablet) by mouth every morning and  100mg  (2 tablets) every evening.   Yes [provider]  senna-docusate (SENOKOT-S) 8.6-50 MG per tablet Take 1 tablet by mouth 2 (two) times daily.   Yes [provider]  torsemide (DEMADEX) 20 MG tablet Take 3 tablets (60 mg total) by mouth daily. Patient taking differently: Take 80 mg by mouth daily. 10/16/19  Yes Elgergawy, Leana Roe, MD  cephALEXin (KEFLEX) 500 MG capsule Take 1 capsule (500 mg total) by mouth 2 (two) times daily. Patient not taking: Reported on 07/15/2023 07/04/23   Rolan Bucco, MD  oseltamivir (TAMIFLU) 75 MG capsule Take 75 mg by mouth daily. Patient not taking: Reported on 07/15/2023 07/01/23   [provider]    Physical Exam: Vitals:   07/15/23 1900 07/15/23 2000 07/15/23 2044 07/15/23 2100  BP: (!) 119/52 (!) 131/57 (!) 124/44 (!) 103/57  Pulse: 62 65 61 62  Resp: 11 15 13 12   Temp:   97.9 F (36.6 C)   TempSrc:   Oral   SpO2: 99% 95% 99% 100%   Physical Exam Constitutional:      Appearance: She is normal weight.  HENT:     Head: Normocephalic.     Mouth/Throat:     Mouth: Mucous membranes are moist.  Eyes:     Extraocular Movements: Extraocular movements intact.  Cardiovascular:     Rate and Rhythm: Normal rate and regular rhythm.  Pulmonary:     Effort: Pulmonary effort is normal.  Abdominal:     General: Bowel sounds are normal.  Musculoskeletal:     Cervical back: Normal range of motion.  Skin:    General: Skin is warm.  Neurological:     Mental Status: She is alert.  Psychiatric:        Mood and Affect: Mood normal.        Labs on Admission: I have personally reviewed the patients's labs and imaging studies.  Assessment/Plan Principal Problem:   Community acquired bacterial pneumonia   # Acute encephalopathy most likely secondary to ESBL UTI # Severe sepsis secondary to UTI #AKI 2/2 UTI - Urine cultures from 2/7 growing ESBL - Patient altered with fatigue and weakness - Creatinine elevated from baseline  around 1.2-1.6 Plan: Start ertapenem Rebolus IV fluids Trend Cr TREND LACTIC   # Hyperkalemia - Patient's potassium was  7.4 and started on Lokelma.  Will plan to continue [facility] and IV fluids.  # Cognitive impairment # Mood disorder # Patient has known dementia with history of mood disorder  Plan: Continue lamotrigine As needed Ativan Continue Seroquel Continue donepezil  # CAD-continue Plavix  # Hypertension-continue Norvasc  # Hyperlipidemia-continue Lipitor    Admission status: Observation Telemetry  Certification: The appropriate patient status for this patient is OBSERVATION. Observation status is judged to be reasonable and necessary in order to provide the required intensity of service to ensure the patient's safety. The patient's presenting symptoms, physical exam findings, and initial radiographic and laboratory data in the context of their medical condition is felt to place them at decreased risk for further clinical deterioration. Furthermore, it is anticipated that the patient will be medically stable for discharge from the hospital within 2 midnights of admission.     Alan Mulder MD [facility] If 7PM-7AM, please contact night-coverage [email or web address]  07/15/2023, 9:30 PM

## 2023-07-15 NOTE — ED Provider Notes (Signed)
 [place] EMERGENCY DEPARTMENT AT [facility] Provider Note   CSN: [id number] Arrival date & time: 07/15/23  1310     History  Chief Complaint  Patient presents with   Fatigue   Headache    Cindy Padilla is a 83 y.o. female, hx of HTN, dementia, DMII, COPD, who presents to the ED 2/2 to increased fatigue. Previously diagnosed with influenza and UTI on 2/7, has since gotten more fatigued.   Per facility, patient had a viral infection, and she has been very lethargic since then.  States that today, symptoms worsen, and she was not talking, to the nursing staff, and was slumped over her, and has not been eating.  Staff has also noticed that she previously was walking with a walker, but now has become wheelchair-bound since 2/7.  Per staff, patient has not been short of breath, having chest pain, fevers, chills, or any wounds that are new.  Home Medications Prior to Admission medications   Medication Sig Start Date End Date Taking? Authorizing Provider  acetaminophen (TYLENOL) 325 MG tablet Take 650 mg by mouth 3 (three) times daily.   Yes [provider]  albuterol (VENTOLIN HFA) 108 (90 Base) MCG/ACT inhaler Inhale 2 puffs into the lungs every 4 (four) hours as needed for wheezing or shortness of breath. 05/12/23  Yes [provider]  amLODipine (NORVASC) 5 MG tablet Take 5 mg by mouth daily.   Yes [provider]  antiseptic oral rinse (BIOTENE) LIQD 10 mLs by Mouth Rinse route at bedtime.   Yes [provider]  atorvastatin (LIPITOR) 20 MG tablet Take 20 mg by mouth every evening.   Yes [provider]  benzonatate (TESSALON) 100 MG capsule Take 1 capsule (100 mg total) by mouth 3 (three) times daily as needed for cough. 05/28/23  Yes Regalado, Belkys A, MD  BREO ELLIPTA 200-25 MCG/ACT AEPB Inhale 1 puff into the lungs daily. 04/21/23  Yes [provider]  Cholecalciferol (VITAMIN D) 50 MCG (2000 UT) tablet Take  2,000 Units by mouth daily.    Yes [provider]  clopidogrel (PLAVIX) 75 MG tablet Take 1 tablet (75 mg total) by mouth daily. 04/20/20  Yes Almon Hercules, MD  cyanocobalamin (VITAMIN B12) 500 MCG tablet Take 1 tablet (500 mcg total) by mouth daily. 05/28/23  Yes Regalado, Belkys A, MD  diclofenac Sodium (VOLTAREN) 1 % GEL Apply 2 g topically 4 (four) times daily as needed (Pain).   Yes [provider]  donepezil (ARICEPT) 10 MG tablet Take 10 mg by mouth at bedtime.   Yes [provider]  ferrous sulfate 325 (65 FE) MG tablet Take 1 tablet (325 mg total) by mouth 2 (two) times daily with a meal. Patient taking differently: Take 325 mg by mouth daily. 04/17/20  Yes Almon Hercules, MD  fluticasone (FLONASE) 50 MCG/ACT nasal spray Place 2 sprays into both nostrils daily.    Yes [provider]  guaiFENesin (MUCINEX) 600 MG 12 hr tablet Take 2 tablets (1,200 mg total) by mouth 2 (two) times daily. 05/28/23  Yes Regalado, Belkys A, MD  hydrocortisone 2.5 % cream Apply 1 Application topically daily as needed (Irritation). 05/12/23  Yes [provider]  INCRUSE ELLIPTA 62.5 MCG/INH AEPB Inhale 1 puff into the lungs daily.  04/17/19  Yes [provider]  ipratropium-albuterol (DUONEB) 0.5-2.5 (3) MG/3ML SOLN Take 3 mLs by nebulization 3 (three) times daily. 05/28/23  Yes Regalado, Prentiss Bells, MD  lamoTRIgine (  LAMICTAL) 200 MG tablet Take 200 mg by mouth 2 (two) times daily.   Yes [provider]  LORazepam (ATIVAN) 0.5 MG tablet Take 0.5 mg by mouth 2 (two) times daily. 06/28/23  Yes [provider]  magnesium oxide (MAG-OX) 400 (241.3 Mg) MG tablet Take 400 mg by mouth daily.   Yes [provider]  montelukast (SINGULAIR) 10 MG tablet Take 10 mg by mouth at bedtime.   Yes [provider]  [facility] powder Apply 1 Application topically 2 (two) times daily as needed (Rash). 05/12/23  Yes [provider]  polyethylene  glycol (MIRALAX / GLYCOLAX) 17 g packet Take 17 g by mouth 2 (two) times daily as needed for moderate constipation.   Yes [provider]  potassium chloride SA (KLOR-CON M) 20 MEQ tablet Take 20 mEq by mouth daily. 04/21/23  Yes [provider]  QUEtiapine (SEROQUEL) 50 MG tablet Take 50-100 mg by mouth See admin instructions. Take 50mg  (1 tablet) by mouth every morning and 100mg  (2 tablets) every evening.   Yes [provider]  senna-docusate (SENOKOT-S) 8.6-50 MG per tablet Take 1 tablet by mouth 2 (two) times daily.   Yes [provider]  torsemide (DEMADEX) 20 MG tablet Take 3 tablets (60 mg total) by mouth daily. Patient taking differently: Take 80 mg by mouth daily. 10/16/19  Yes Elgergawy, Leana Roe, MD  cephALEXin (KEFLEX) 500 MG capsule Take 1 capsule (500 mg total) by mouth 2 (two) times daily. Patient not taking: Reported on 07/15/2023 07/04/23   Rolan Bucco, MD  oseltamivir (TAMIFLU) 75 MG capsule Take 75 mg by mouth daily. Patient not taking: Reported on 07/15/2023 07/01/23   [provider]      Allergies    Sulfa antibiotics, Iodine, Ms contin [morphine], Aminoglycosides, Aspirin, Codeine, Ivp dye [iodinated contrast media], Neomycin, Penicillins, Phenothiazines, Promethazine, and Tetracyclines & related    Review of Systems   Review of Systems  Constitutional:  Positive for fatigue. Negative for fever.    Physical Exam Updated Vital Signs BP 132/68 (BP Location: Left Arm)   Pulse 69   Temp 98.4 F (36.9 C) (Oral)   Resp 16   SpO2 98%  Physical Exam Vitals and nursing note reviewed.  Constitutional:      General: She is not in acute distress.    Appearance: She is well-developed.     Comments: Chronically ill appearing, mumbling  HENT:     Head: Normocephalic and atraumatic.     Mouth/Throat:     Mouth: Mucous membranes are dry.  Eyes:     Conjunctiva/sclera: Conjunctivae normal.  Cardiovascular:     Rate and Rhythm:  Normal rate and regular rhythm.     Heart sounds: No murmur heard. Pulmonary:     Effort: Pulmonary effort is normal. No respiratory distress.     Breath sounds: Normal breath sounds.  Abdominal:     Palpations: Abdomen is soft.     Tenderness: There is no abdominal tenderness.  Musculoskeletal:        General: No swelling.     Cervical back: Neck supple.  Skin:    General: Skin is warm and dry.     Capillary Refill: Capillary refill takes less than 2 seconds.     Comments: Dressing to left lower extremity.  2+ pitting edema bilateral lower extremities no overlying rash   Neurological:     Mental Status: She is lethargic.  Psychiatric:        Mood  and Affect: Mood normal.     ED Results / Procedures / Treatments   Labs (all labs ordered are listed, but only abnormal results are displayed) Labs Reviewed  RESP PANEL BY RT-PCR (RSV, FLU A&B, COVID)  [id number]  CBC WITH DIFFERENTIAL/PLATELET  COMPREHENSIVE METABOLIC PANEL  URINALYSIS, ROUTINE W REFLEX MICROSCOPIC  BRAIN NATRIURETIC PEPTIDE  CBG MONITORING, ED  I-STAT CG4 LACTIC ACID, ED  TROPONIN I (HIGH SENSITIVITY)    EKG None  Radiology No results found.  Procedures Procedures    Medications Ordered in ED Medications  sodium chloride 0.9 % bolus 500 mL (has no administration in time range)    ED Course/ Medical Decision Making/ A&P                                 Medical Decision Making Patient is an 83 year old female, here with progressive decline, over the last 2 weeks, after being in the hospital with a viral respiratory illness, and a urinary tract infection.  Staff stated that she was previously able to walk, and do typical daily activities, but has now become wheelchair-bound, and is not interacting, as previous.  We obtain a head CT, EKG, troponins, urinalysis, chest x-ray for further evaluation of her symptoms, to evaluate for any kind of metabolic, traumatic, or cardiac reasoning for her symptoms.  Most  history was obtained via calling the nursing facility, and tainting history, given patient's history of dementia.  Concern for failure to thrive, likely need admission given worsening symptoms, pending labs handed off to [place], [place], pending CAT scan, and blood work results, for further disposition.  Amount and/or Complexity of Data Reviewed Labs: ordered. Radiology: ordered.    Final Clinical Impression(s) / ED Diagnoses Final diagnoses:  None    Rx / DC Orders ED Discharge Orders     None         Mikey Maffett, Harley [place], PA 07/15/23 1459    [name], MD 07/15/23 2322

## 2023-07-15 NOTE — ED Provider Notes (Signed)
 Received patient from signout from previous provider pending ED workup.  See her note.  In short, patient presents to emergency department for evaluation of lethargy, progressive declining since UTI two weeks ago. Per nursing staff, patient has been requiring wheelchair assistance and has had decreased appetite. Demented at baseline.  Lab work significant for mild leukocytosis of 11.1, mild anemia of 11 per baseline.  Flu A+.  Potassium 6.4.  CBG 107.  Creatinine 1.55. Lactic acid initially 2.7 increased to 3.1.  First troponin 37 and second troponin 30 (recently has had mildly elevated troponins likely due to elevated creatinine).  Urine appears infectious with leukocytes, WBC, bacteria. EKG without peaked T waves. CT without acute changes. CXR neg for PNA, effusion  Provided 1 L IVF for elevated creatinine, likely dehydration.  Will treat UTI with rocephin. Lokelma for hyperkalemia  Will consult hospitalist and admit for flu, UTI, elevated lactate, hyperkalemia, fatigue  Alan Mulder MD accepts patient for admission    Judithann Sheen, [place] 07/15/23 2129    Gwyneth Sprout, MD 07/16/23 0030

## 2023-07-15 NOTE — ED Triage Notes (Signed)
 Pt BIB EMS due to pt being lethargic for the past few days. Pt c/o headache. Facility called EMS after noticing a decline in pt activity and baseline. Hx of Dementia AAOx2; Dx with UTI 2 weeks ago.  In Route BP 125/67 HR 74 SpO2 96% CBG 132 Hx of Diabetes RR 18 18ga R Wrist

## 2023-07-16 ENCOUNTER — Inpatient Hospital Stay (HOSPITAL_COMMUNITY): Payer: Medicare Other

## 2023-07-16 DIAGNOSIS — J1008 Influenza due to other identified influenza virus with other specified pneumonia: Secondary | ICD-10-CM | POA: Diagnosis present

## 2023-07-16 DIAGNOSIS — K449 Diaphragmatic hernia without obstruction or gangrene: Secondary | ICD-10-CM | POA: Diagnosis not present

## 2023-07-16 DIAGNOSIS — K922 Gastrointestinal hemorrhage, unspecified: Secondary | ICD-10-CM | POA: Diagnosis not present

## 2023-07-16 DIAGNOSIS — R5383 Other fatigue: Secondary | ICD-10-CM | POA: Diagnosis present

## 2023-07-16 DIAGNOSIS — I10 Essential (primary) hypertension: Secondary | ICD-10-CM | POA: Diagnosis present

## 2023-07-16 DIAGNOSIS — Z1612 Extended spectrum beta lactamase (ESBL) resistance: Secondary | ICD-10-CM | POA: Diagnosis present

## 2023-07-16 DIAGNOSIS — L03116 Cellulitis of left lower limb: Secondary | ICD-10-CM | POA: Diagnosis present

## 2023-07-16 DIAGNOSIS — A419 Sepsis, unspecified organism: Secondary | ICD-10-CM | POA: Diagnosis present

## 2023-07-16 DIAGNOSIS — K264 Chronic or unspecified duodenal ulcer with hemorrhage: Secondary | ICD-10-CM | POA: Diagnosis present

## 2023-07-16 DIAGNOSIS — G9341 Metabolic encephalopathy: Secondary | ICD-10-CM | POA: Diagnosis present

## 2023-07-16 DIAGNOSIS — L97909 Non-pressure chronic ulcer of unspecified part of unspecified lower leg with unspecified severity: Secondary | ICD-10-CM | POA: Diagnosis not present

## 2023-07-16 DIAGNOSIS — Z7902 Long term (current) use of antithrombotics/antiplatelets: Secondary | ICD-10-CM | POA: Diagnosis not present

## 2023-07-16 DIAGNOSIS — K3189 Other diseases of stomach and duodenum: Secondary | ICD-10-CM | POA: Diagnosis not present

## 2023-07-16 DIAGNOSIS — J159 Unspecified bacterial pneumonia: Secondary | ICD-10-CM | POA: Diagnosis present

## 2023-07-16 DIAGNOSIS — L03115 Cellulitis of right lower limb: Secondary | ICD-10-CM | POA: Diagnosis present

## 2023-07-16 DIAGNOSIS — R64 Cachexia: Secondary | ICD-10-CM | POA: Diagnosis present

## 2023-07-16 DIAGNOSIS — N390 Urinary tract infection, site not specified: Secondary | ICD-10-CM | POA: Diagnosis present

## 2023-07-16 DIAGNOSIS — Z23 Encounter for immunization: Secondary | ICD-10-CM | POA: Diagnosis present

## 2023-07-16 DIAGNOSIS — E872 Acidosis, unspecified: Secondary | ICD-10-CM | POA: Diagnosis present

## 2023-07-16 DIAGNOSIS — J90 Pleural effusion, not elsewhere classified: Secondary | ICD-10-CM | POA: Diagnosis present

## 2023-07-16 DIAGNOSIS — K921 Melena: Secondary | ICD-10-CM | POA: Diagnosis not present

## 2023-07-16 DIAGNOSIS — F0393 Unspecified dementia, unspecified severity, with mood disturbance: Secondary | ICD-10-CM | POA: Diagnosis present

## 2023-07-16 DIAGNOSIS — I350 Nonrheumatic aortic (valve) stenosis: Secondary | ICD-10-CM | POA: Diagnosis present

## 2023-07-16 DIAGNOSIS — D539 Nutritional anemia, unspecified: Secondary | ICD-10-CM | POA: Diagnosis present

## 2023-07-16 DIAGNOSIS — D62 Acute posthemorrhagic anemia: Secondary | ICD-10-CM | POA: Diagnosis present

## 2023-07-16 DIAGNOSIS — Z1619 Resistance to other specified beta lactam antibiotics: Secondary | ICD-10-CM | POA: Diagnosis present

## 2023-07-16 DIAGNOSIS — J440 Chronic obstructive pulmonary disease with acute lower respiratory infection: Secondary | ICD-10-CM | POA: Diagnosis present

## 2023-07-16 DIAGNOSIS — N179 Acute kidney failure, unspecified: Secondary | ICD-10-CM | POA: Diagnosis present

## 2023-07-16 DIAGNOSIS — Z66 Do not resuscitate: Secondary | ICD-10-CM | POA: Diagnosis present

## 2023-07-16 DIAGNOSIS — K269 Duodenal ulcer, unspecified as acute or chronic, without hemorrhage or perforation: Secondary | ICD-10-CM | POA: Diagnosis not present

## 2023-07-16 DIAGNOSIS — Z1152 Encounter for screening for COVID-19: Secondary | ICD-10-CM | POA: Diagnosis not present

## 2023-07-16 LAB — BASIC METABOLIC PANEL
Anion gap: 8 (ref 5–15)
Anion gap: 9 (ref 5–15)
BUN: 35 mg/dL — ABNORMAL HIGH (ref 8–23)
BUN: 30 mg/dL — ABNORMAL HIGH (ref 8–23)
CO2: 18 mmol/L — ABNORMAL LOW (ref 22–32)
CO2: 21 mmol/L — ABNORMAL LOW (ref 22–32)
Calcium: 7.5 mg/dL — ABNORMAL LOW (ref 8.9–10.3)
Calcium: 7.9 mg/dL — ABNORMAL LOW (ref 8.9–10.3)
Chloride: 107 mmol/L (ref 98–111)
Chloride: 105 mmol/L (ref 98–111)
Creatinine, Ser: 0.98 mg/dL (ref 0.44–1.00)
Creatinine, Ser: 0.79 mg/dL (ref 0.44–1.00)
GFR, Estimated: 57 mL/min — ABNORMAL LOW (ref >60)
GFR, Estimated: >60 mL/min (ref >60)
Glucose, Bld: 113 mg/dL — ABNORMAL HIGH (ref 70–99)
Glucose, Bld: 70 mg/dL (ref 70–99)
Potassium: 4.9 mmol/L (ref 3.5–5.1)
Potassium: 4.5 mmol/L (ref 3.5–5.1)
Sodium: 133 mmol/L — ABNORMAL LOW (ref 135–145)
Sodium: 135 mmol/L (ref 135–145)

## 2023-07-16 LAB — LACTIC ACID, PLASMA
Lactic Acid, Venous: >9 mmol/L (ref 0.5–1.9)
Lactic Acid, Venous: 3 mmol/L (ref 0.5–1.9)

## 2023-07-16 LAB — BLOOD GAS, ARTERIAL
Acid-base deficit: 3.1 mmol/L — ABNORMAL HIGH (ref 0.0–2.0)
Bicarbonate: 21.2 mmol/L (ref 20.0–28.0)
Drawn by: 23281
O2 Saturation: 99.8 %
Patient temperature: 36.5
pCO2 arterial: 34 mm[Hg] (ref 32–48)
pH, Arterial: 7.4 (ref 7.35–7.45)
pO2, Arterial: 82 mm[Hg] — ABNORMAL LOW (ref 83–108)

## 2023-07-16 LAB — IRON AND TIBC
Iron: 32 ug/dL (ref 28–170)
Saturation Ratios: 20 % (ref 10.4–31.8)
TIBC: 161 ug/dL — ABNORMAL LOW (ref 250–450)
UIBC: 129 ug/dL

## 2023-07-16 LAB — FOLATE: Folate: 7.5 ng/mL (ref >5.9)

## 2023-07-16 LAB — RETICULOCYTES
Immature Retic Fract: 37.4 % — ABNORMAL HIGH (ref 2.3–15.9)
RBC.: 2.95 MIL/uL — ABNORMAL LOW (ref 3.87–5.11)
Retic Count, Absolute: 157.8 10*3/uL (ref 19.0–186.0)
Retic Ct Pct: 5.4 % — ABNORMAL HIGH (ref 0.4–3.1)

## 2023-07-16 LAB — CBC
HCT: 31.4 % — ABNORMAL LOW (ref 36.0–46.0)
Hemoglobin: 9.4 g/dL — ABNORMAL LOW (ref 12.0–15.0)
MCH: 30.6 pg (ref 26.0–34.0)
MCHC: 29.9 g/dL — ABNORMAL LOW (ref 30.0–36.0)
MCV: 102.3 fL — ABNORMAL HIGH (ref 80.0–100.0)
Platelets: 339 10*3/uL (ref 150–400)
RBC: 3.07 MIL/uL — ABNORMAL LOW (ref 3.87–5.11)
RDW: 20.1 % — ABNORMAL HIGH (ref 11.5–15.5)
WBC: 8.3 10*3/uL (ref 4.0–10.5)
nRBC: 0.2 % (ref 0.0–0.2)

## 2023-07-16 LAB — VITAMIN B12: Vitamin B-12: 2733 pg/mL — ABNORMAL HIGH (ref 180–914)

## 2023-07-16 LAB — MRSA NEXT GEN BY PCR, NASAL: MRSA by PCR Next Gen: NOT DETECTED

## 2023-07-16 LAB — FERRITIN: Ferritin: 559 ng/mL — ABNORMAL HIGH (ref 11–307)

## 2023-07-16 MED ORDER — NOREPINEPHRINE 4 MG/250ML-% IV SOLN: 0.0000 ug/min | INTRAVENOUS | Status: DC

## 2023-07-16 MED ORDER — NOREPINEPHRINE 4 MG/250ML-% IV SOLN: 2.0000 ug/min | INTRAVENOUS | Status: DC

## 2023-07-16 MED ORDER — ENOXAPARIN SODIUM 40 MG/0.4ML IJ SOSY
40.0000 mg | PREFILLED_SYRINGE | INTRAMUSCULAR | Status: DC
  Administered 2023-07-16 – 2023-07-18 (×3): 40 mg via SUBCUTANEOUS
  Filled 2023-07-16 (×3): qty 0.4

## 2023-07-16 MED ORDER — UMECLIDINIUM BROMIDE 62.5 MCG/ACT IN AEPB
1.0000 | INHALATION_SPRAY | Freq: Every day | RESPIRATORY_TRACT | Status: DC
  Administered 2023-07-18 – 2023-07-28 (×8): 1 via RESPIRATORY_TRACT
  Filled 2023-07-16 (×3): qty 7

## 2023-07-16 MED ORDER — SODIUM CHLORIDE 0.9 % IV SOLN: 250.0000 mL | INTRAVENOUS | Status: AC

## 2023-07-16 MED ORDER — VANCOMYCIN HCL 750 MG/150ML IV SOLN
750.0000 mg | INTRAVENOUS | Status: DC
  Filled 2023-07-16: qty 150

## 2023-07-16 MED ORDER — VANCOMYCIN HCL 750 MG IV SOLR
750.0000 mg | INTRAVENOUS | Status: DC
  Administered 2023-07-17 – 2023-07-22 (×6): 750 mg via INTRAVENOUS
  Filled 2023-07-16: qty 15
  Filled 2023-07-16: qty 750
  Filled 2023-07-16 (×4): qty 15

## 2023-07-16 MED ORDER — PNEUMOCOCCAL 20-VAL CONJ VACC 0.5 ML IM SUSY
0.5000 mL | PREFILLED_SYRINGE | INTRAMUSCULAR | Status: AC
  Administered 2023-07-17: 0.5 mL via INTRAMUSCULAR
  Filled 2023-07-16: qty 0.5

## 2023-07-16 MED ORDER — INFLUENZA VAC A&B SURF ANT ADJ 0.5 ML IM SUSY
0.5000 mL | PREFILLED_SYRINGE | INTRAMUSCULAR | Status: DC
  Filled 2023-07-16: qty 0.5

## 2023-07-16 MED ORDER — LACTATED RINGERS IV BOLUS
1000.0000 mL | Freq: Once | INTRAVENOUS | Status: AC
  Administered 2023-07-16: 1000 mL via INTRAVENOUS

## 2023-07-16 MED ORDER — VANCOMYCIN HCL 750 MG/150ML IV SOLN: 750.0000 mg | INTRAVENOUS | Status: DC

## 2023-07-16 MED ORDER — VANCOMYCIN HCL IN DEXTROSE 1-5 GM/200ML-% IV SOLN
1000.0000 mg | Freq: Once | INTRAVENOUS | Status: AC
  Administered 2023-07-16: 1000 mg via INTRAVENOUS
  Filled 2023-07-16: qty 200

## 2023-07-16 MED ORDER — MONTELUKAST SODIUM 10 MG PO TABS
10.0000 mg | ORAL_TABLET | Freq: Every day | ORAL | Status: DC
  Administered 2023-07-17 – 2023-07-28 (×12): 10 mg via ORAL
  Filled 2023-07-16 (×12): qty 1

## 2023-07-16 MED ORDER — CHLORHEXIDINE GLUCONATE CLOTH 2 % EX PADS
6.0000 | MEDICATED_PAD | Freq: Every day | CUTANEOUS | Status: DC
  Administered 2023-07-16 – 2023-07-29 (×13): 6 via TOPICAL

## 2023-07-16 MED ORDER — FLUTICASONE FUROATE-VILANTEROL 200-25 MCG/ACT IN AEPB
1.0000 | INHALATION_SPRAY | Freq: Every day | RESPIRATORY_TRACT | Status: DC
  Administered 2023-07-18 – 2023-07-28 (×9): 1 via RESPIRATORY_TRACT
  Filled 2023-07-16 (×2): qty 28

## 2023-07-16 MED ORDER — NOREPINEPHRINE 4 MG/250ML-% IV SOLN
INTRAVENOUS | Status: AC
  Filled 2023-07-16: qty 250

## 2023-07-16 NOTE — Progress Notes (Signed)
 Pharmacy Antibiotic Note  Cindy Padilla is a 83 y.o. female admitted on 07/15/2023 with  fatigue .  PMH significant for recent ED visit on 2/7 at which she tested positive for influenza A. Pt was also diagnosed with UTI and prescribed cephalexin. UCx resulted as ESBL+ E.coli and antibiotics were changed to fosfomycin 3 g PO once. Influenza again + on admission.  Pt was started on ertapenem on admission. Pharmacy consulted to dose vancomycin for sepsis.   Today, 07/16/23 WBC improved to WNL SCr improved to WNL. CrCl ~45 mL/min Afebrile  Plan: Ertapenem 1 g IV q24h  Adjust vancomycin to 750 mg IV q24h based on improvement in renal function. Estimated AUC 554. Goal AUC 400-550. Check levels as needed Monitor renal function, culture data for ability to de-escalate antibiotics.   Height: 5' (152.4 cm) Weight: 63.3 kg (139 lb 8.8 oz) IBW/kg (Calculated) : 45.5  Temp (24hrs), Avg:98.1 F (36.7 C), Min:97.5 F (36.4 C), Max:98.6 F (37 C)  Recent Labs  Lab 07/15/23 1523 07/15/23 1535 07/15/23 1710 07/15/23 1719 07/15/23 2328 07/16/23 0248 07/16/23 0733  WBC 11.1*  --   --   --   --   --  8.3  CREATININE 1.60*  --  1.55*  --   --  0.98 0.79  LATICACIDVEN  --  2.7*  --  3.1* >9.0* 3.0*  --     Estimated Creatinine Clearance: 44.2 mL/min (by C-G formula based on SCr of 0.79 mg/dL).    Allergies  Allergen Reactions   Sulfa Antibiotics Other (See Comments)    Unknown reaction  Listed as an allergy on MAR  Other Reaction(s): Other (See Comments), Unknown    Unknown reaction Listed as an allergy on MAR   Iodine Other (See Comments)    Unknown reaction Listed as an allergy on MAR   Ms Contin [Morphine] Other (See Comments)    Unknown reaction Listed as an allergy on MAR   Aminoglycosides Other (See Comments)    Unknown reaction  Listed as an allergy on MAR   Aspirin Other (See Comments)    Unknown reaction  Listed as an allergy on MAR   Codeine Other (See  Comments)    Unknown reaction Listed as an allergy on MAR   Ivp Dye [Iodinated Contrast Media] Other (See Comments)    Unknown reaction Listed as an allergy on MAR   Neomycin Other (See Comments)    Unknown reaction Listed as an allergy on MAR   Penicillins Rash and Other (See Comments)    Not listed on MAR  Has patient had a PCN reaction causing immediate rash, facial/tongue/throat swelling, SOB or lightheadedness with hypotension: No Has patient had a PCN reaction causing severe rash involving mucus membranes or skin necrosis: No Has patient had a PCN reaction that required hospitalization No Has patient had a PCN reaction occurring within the last 10 years: No If all of the above answers are "NO", then may proceed with Cephalosporin use.   Phenothiazines Other (See Comments)    Unknown reaction Listed as an allergy on MAR   Promethazine Other (See Comments)    Unknown reaction Not listed on MAR   Tetracyclines & Related Other (See Comments)    Unknown reaction Listed as an allergy on MAR    Antimicrobials this admission: ertapenem 2/18 >>  vancomycin 2/19 >>   Dose adjustments this admission:  Microbiology results: 2/18 BCx:  2/18 UCx:   Influenza A+  Cindi Carbon, PharmD 07/16/2023  9:07 AM

## 2023-07-16 NOTE — Progress Notes (Addendum)
    OVERNIGHT PROGRESS REPORT  Photograph of leg wound available in media- appears as an avulsion (skin flap returned to area by Nursing with bandaging.   ....8 cm length x 4 cm width on ICU admission.      Chinita Greenland MSNA MSN ACNPC-AG Acute Care Nurse Practitioner [facility]

## 2023-07-16 NOTE — Progress Notes (Signed)
 ABg obtained and sent to lab for processing.  [name] (from Lab) notified.

## 2023-07-16 NOTE — Consult Note (Signed)
 WOC Nurse Consult Note: Reason for Consult: leg wound Wound type: skin tear/avulsion Pressure Injury POA: NA Measurement: 8cm x 4cm x 0.3cm  Wound QMV:HQION /friable at wound edges  Drainage (amount, consistency, odor) sanguinous, blood clot Periwound: intact  Dressing procedure/placement/frequency: Cover wound with single layer of xeroform gauze, top with foam. Change every 3 days   Discussed POC with patient and bedside nurse.  Re consult if needed, will not follow at this time. Thanks  Teigan Manner [employer], RN,CWOCN, CNS, CWON-AP [phone])
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
## 2023-07-16 NOTE — Progress Notes (Signed)
 ABI's have been completed. Preliminary results can be found in CV Proc through chart review.   07/16/23 10:52 AM Olen Cordial RVT

## 2023-07-16 NOTE — ED Notes (Signed)
 ED TO INPATIENT HANDOFF REPORT  ED Nurse Name and Phone #: Majel Homer, RN   S Name/Age/Gender Cindy Padilla 83 y.o. female Room/Bed: WA21/WA21  Code Status   Code Status: Limited: Do not attempt resuscitation (DNR) -DNR-LIMITED -Do Not Intubate/DNI   Home/SNF/Other Nursing Home Patient oriented to: self and place Is this baseline? Yes   Triage Complete: Triage complete  Chief Complaint Community acquired bacterial pneumonia [J15.9] Sepsis (HCC) [A41.9]  Triage Note Pt BIB EMS due to pt being lethargic for the past few days. Pt c/o headache. Facility called EMS after noticing a decline in pt activity and baseline. Hx of Dementia AAOx2; Dx with UTI 2 weeks ago.  In Route BP 125/67 HR 74 SpO2 96% CBG 132 Hx of Diabetes RR 18 18ga R Wrist   Allergies Allergies  Allergen Reactions   Sulfa Antibiotics Other (See Comments)    Unknown reaction  Listed as an allergy on MAR  Other Reaction(s): Other (See Comments), Unknown    Unknown reaction Listed as an allergy on MAR   Iodine Other (See Comments)    Unknown reaction Listed as an allergy on MAR   Ms Contin [Morphine] Other (See Comments)    Unknown reaction Listed as an allergy on MAR   Aminoglycosides Other (See Comments)    Unknown reaction  Listed as an allergy on MAR   Aspirin Other (See Comments)    Unknown reaction  Listed as an allergy on MAR   Codeine Other (See Comments)    Unknown reaction Listed as an allergy on MAR   Ivp Dye [Iodinated Contrast Media] Other (See Comments)    Unknown reaction Listed as an allergy on MAR   Neomycin Other (See Comments)    Unknown reaction Listed as an allergy on MAR   Penicillins Rash and Other (See Comments)    Not listed on MAR  Has patient had a PCN reaction causing immediate rash, facial/tongue/throat swelling, SOB or lightheadedness with hypotension: No Has patient had a PCN reaction causing severe rash involving mucus membranes or skin necrosis:  No Has patient had a PCN reaction that required hospitalization No Has patient had a PCN reaction occurring within the last 10 years: No If all of the above answers are "NO", then may proceed with Cephalosporin use.   Phenothiazines Other (See Comments)    Unknown reaction Listed as an allergy on MAR   Promethazine Other (See Comments)    Unknown reaction Not listed on MAR   Tetracyclines & Related Other (See Comments)    Unknown reaction Listed as an allergy on MAR    Level of Care/Admitting Diagnosis ED Disposition     ED Disposition  Admit   Condition  --   Comment  Hospital Area: [facility] [[id number]]  Level of Care: Stepdown [14]  Admit to SDU based on following criteria: Hemodynamic compromise or significant risk of instability:  Patient requiring short term acute titration and management of vasoactive drips, and invasive monitoring (i.e., CVP and Arterial line).  May admit patient to [facility] or [facility] if equivalent level of care is available:: No  Covid Evaluation: Asymptomatic - no recent exposure (last 10 days) testing not required  Diagnosis: Sepsis [facility]) [[id number]]  Admitting Physician: Alan Mulder [[id number]]  Attending Physician: Alan Mulder [[id number]]  Certification:: I certify this patient will need inpatient services for at least 2 midnights  Expected Medical Readiness: 07/18/2023          B Medical/Surgery History  Past Medical History:  Diagnosis Date   Arthritis    Bipolar 1 disorder (HCC)    Bronchitis    Chronic back pain    Constipation    COPD (chronic obstructive pulmonary disease) (HCC)    Dementia (HCC)    Diabetes mellitus    GERD (gastroesophageal reflux disease)    Hiatal hernia    Hypertension    IBS (irritable bowel syndrome)    Past Surgical History:  Procedure Laterality Date   ESOPHAGOGASTRODUODENOSCOPY (EGD) WITH PROPOFOL N/A 04/14/2020   Procedure: ESOPHAGOGASTRODUODENOSCOPY (EGD) WITH PROPOFOL;   Surgeon: Napoleon Form, MD;  Location: [facility];  Service: Endoscopy;  Laterality: N/A;   gallbladder     MANDIBLE SURGERY     PARTIAL HYSTERECTOMY       A IV Location/Drains/Wounds Patient Lines/Drains/Airways Status     Active Line/Drains/Airways     Name Placement date Placement time Site Days   Peripheral IV 07/15/23 18 G Posterior;Right Wrist 07/15/23  1329  Wrist  1   Peripheral IV 07/15/23 20 G Anterior;Proximal;Right Forearm 07/15/23  1532  Forearm  1   Urethral Catheter Alamin Mccuiston, RN Latex 16 Fr. 07/16/23  0154  Latex  less than 1   Wound / Incision (Open or Dehisced) 06/11/16 Other (Comment) Leg Right;Left reddened rash 06/11/16  0130  Leg  2591   Wound / Incision (Open or Dehisced) 02/26/17 Other (Comment) Leg Right;Left full thickness wound 02/26/17  1139  Leg  2331            Intake/Output Last 24 hours  Intake/Output Summary (Last 24 hours) at 07/16/2023 0302 Last data filed at 07/16/2023 0155 Gross per 24 hour  Intake --  Output 1600 ml  Net -1600 ml    Labs/Imaging Results for orders placed or performed during the hospital encounter of 07/15/23 (from the past 48 hours)  Resp panel by RT-PCR (RSV, Flu A&B, Covid) Anterior Nasal Swab     Status: Abnormal   Collection Time: 07/15/23  1:55 PM   Specimen: Anterior Nasal Swab  Result Value Ref Range   SARS Coronavirus 2 by RT PCR NEGATIVE NEGATIVE    Comment: (NOTE) SARS-CoV-2 target nucleic acids are NOT DETECTED.  The SARS-CoV-2 RNA is generally detectable in upper respiratory specimens during the acute phase of infection. The lowest concentration of SARS-CoV-2 viral copies this assay can detect is 138 copies/mL. A negative result does not preclude SARS-Cov-2 infection and should not be used as the sole basis for treatment or other patient management decisions. A negative result may occur with  improper specimen collection/handling, submission of specimen other than nasopharyngeal swab, presence of  viral mutation(s) within the areas targeted by this assay, and inadequate number of viral copies(<138 copies/mL). A negative result must be combined with clinical observations, patient history, and epidemiological information. The expected result is Negative.  Fact Sheet for Patients:  BloggerCourse.com  Fact Sheet for Healthcare Providers:  SeriousBroker.it  This test is no t yet approved or cleared by the [place] FDA and  has been authorized for detection and/or diagnosis of SARS-CoV-2 by FDA under an Emergency Use Authorization (EUA). This EUA will remain  in effect (meaning this test can be used) for the duration of the COVID-19 declaration under Section 564(b)(1) of the Act, 21 U.S.C.section 360bbb-3(b)(1), unless the authorization is terminated  or revoked sooner.       Influenza A by PCR POSITIVE (A) NEGATIVE   Influenza B by PCR NEGATIVE NEGATIVE    Comment: (  NOTE) The Xpert Xpress SARS-CoV-2/FLU/RSV plus assay is intended as an aid in the diagnosis of influenza from Nasopharyngeal swab specimens and should not be used as a sole basis for treatment. Nasal washings and aspirates are unacceptable for Xpert Xpress SARS-CoV-2/FLU/RSV testing.  Fact Sheet for Patients: BloggerCourse.com  Fact Sheet for Healthcare Providers: SeriousBroker.it  This test is not yet approved or cleared by the [place] FDA and has been authorized for detection and/or diagnosis of SARS-CoV-2 by FDA under an Emergency Use Authorization (EUA). This EUA will remain in effect (meaning this test can be used) for the duration of the COVID-19 declaration under Section 564(b)(1) of the Act, 21 U.S.C. section 360bbb-3(b)(1), unless the authorization is terminated or revoked.     Resp Syncytial Virus by PCR NEGATIVE NEGATIVE    Comment: (NOTE) Fact Sheet for  Patients: BloggerCourse.com  Fact Sheet for Healthcare Providers: SeriousBroker.it  This test is not yet approved or cleared by the [place] FDA and has been authorized for detection and/or diagnosis of SARS-CoV-2 by FDA under an Emergency Use Authorization (EUA). This EUA will remain in effect (meaning this test can be used) for the duration of the COVID-19 declaration under Section 564(b)(1) of the Act, 21 U.S.C. section 360bbb-3(b)(1), unless the authorization is terminated or revoked.  Performed at [facility], [address] [place], [place] [address]   CBC with Differential     Status: Abnormal   Collection Time: 07/15/23  3:23 PM  Result Value Ref Range   WBC 11.1 (H) 4.0 - 10.5 K/uL   RBC 3.74 (L) 3.87 - 5.11 MIL/uL   Hemoglobin 11.1 (L) 12.0 - 15.0 g/dL   HCT 60.4 (L) 54.0 - 98.1 %   MCV 96.0 80.0 - 100.0 fL   MCH 29.7 26.0 - 34.0 pg   MCHC 30.9 30.0 - 36.0 g/dL   RDW 19.1 (H) 47.8 - 29.5 %   Platelets 463 (H) 150 - 400 K/uL   nRBC 0.9 (H) 0.0 - 0.2 %   Neutrophils Relative % 83 %   Neutro Abs 9.1 (H) 1.7 - 7.7 K/uL   Lymphocytes Relative 11 %   Lymphs Abs 1.3 0.7 - 4.0 K/uL   Monocytes Relative 5 %   Monocytes Absolute 0.6 0.1 - 1.0 K/uL   Eosinophils Relative 0 %   Eosinophils Absolute 0.0 0.0 - 0.5 K/uL   Basophils Relative 0 %   Basophils Absolute 0.0 0.0 - 0.1 K/uL   Immature Granulocytes 1 %   Abs Immature Granulocytes 0.16 (H) 0.00 - 0.07 K/uL    Comment: Performed at [facility], [address] [place], [place] [address]  Comprehensive metabolic panel     Status: Abnormal   Collection Time: 07/15/23  3:23 PM  Result Value Ref Range   Sodium 130 (L) 135 - 145 mmol/L   Potassium 7.3 (HH) 3.5 - 5.1 mmol/L    Comment: CRITICAL RESULT CALLED TO, READ BACK BY AND VERIFIED WITH LEWIS,M. RN AT 1617 07/15/23 MULLINS,T    Chloride 98 98 - 111 mmol/L   CO2 20 (L)  22 - 32 mmol/L   Glucose, Bld 106 (H) 70 - 99 mg/dL    Comment: Glucose reference range applies only to samples taken after fasting for at least 8 hours.   BUN 52 (H) 8 - 23 mg/dL   Creatinine, Ser 8.65 (H) 0.44 - 1.00 mg/dL   Calcium 8.7 (L) 8.9 - 10.3 mg/dL   Total Protein 7.1 6.5 -  8.1 g/dL   Albumin 2.9 (L) 3.5 - 5.0 g/dL   AST 37 15 - 41 U/L   ALT 23 0 - 44 U/L   Alkaline Phosphatase 165 (H) 38 - 126 U/L   Total Bilirubin 1.1 0.0 - 1.2 mg/dL   GFR, Estimated 32 (L) >60 mL/min    Comment: (NOTE) Calculated using the CKD-EPI Creatinine Equation (2021)    Anion gap 12 5 - 15    Comment: Performed at [facility], [address] [place], [place] [address]  Brain natriuretic peptide     Status: Abnormal   Collection Time: 07/15/23  3:23 PM  Result Value Ref Range   B Natriuretic Peptide 163.9 (H) 0.0 - 100.0 pg/mL    Comment: Performed at [facility], [address], [place], [place] [address]  Troponin I (High Sensitivity)     Status: Abnormal   Collection Time: 07/15/23  3:23 PM  Result Value Ref Range   Troponin I (High Sensitivity) 37 (H) <18 ng/L    Comment: (NOTE) Elevated high sensitivity troponin I (hsTnI) values and significant  changes across serial measurements may suggest ACS but many other  chronic and acute conditions are known to elevate hsTnI results.  Refer to the "Links" section for chest pain algorithms and additional  guidance. Performed at [facility], [address] [place], [place] [address]   I-Stat CG4 Lactic Acid     Status: Abnormal   Collection Time: 07/15/23  3:35 PM  Result Value Ref Range   Lactic Acid, Venous 2.7 (HH) 0.5 - 1.9 mmol/L   Comment NOTIFIED PHYSICIAN   POC CBG, ED     Status: Abnormal   Collection Time: 07/15/23  3:50 PM  Result Value Ref Range   Glucose-Capillary 106 (H) 70 - 99 mg/dL    Comment: Glucose reference range applies only to samples taken after fasting for  at least 8 hours.  Troponin I (High Sensitivity)     Status: Abnormal   Collection Time: 07/15/23  5:10 PM  Result Value Ref Range   Troponin I (High Sensitivity) 30 (H) <18 ng/L    Comment: (NOTE) Elevated high sensitivity troponin I (hsTnI) values and significant  changes across serial measurements may suggest ACS but many other  chronic and acute conditions are known to elevate hsTnI results.  Refer to the "Links" section for chest pain algorithms and additional  guidance. Performed at [facility], [address], [place], [place] [address]   Basic metabolic panel     Status: Abnormal   Collection Time: 07/15/23  5:10 PM  Result Value Ref Range   Sodium 132 (L) 135 - 145 mmol/L   Potassium 6.4 (HH) 3.5 - 5.1 mmol/L    Comment: CRITICAL RESULT CALLED TO, READ BACK BY AND VERIFIED WITH NELSON,J. RN AT 1751 07/15/23 MULLINS,T    Chloride 102 98 - 111 mmol/L   CO2 21 (L) 22 - 32 mmol/L   Glucose, Bld 107 (H) 70 - 99 mg/dL    Comment: Glucose reference range applies only to samples taken after fasting for at least 8 hours.   BUN 52 (H) 8 - 23 mg/dL   Creatinine, Ser 2.95 (H) 0.44 - 1.00 mg/dL   Calcium 8.4 (L) 8.9 - 10.3 mg/dL   GFR, Estimated 33 (L) >60 mL/min    Comment: (NOTE) Calculated using the CKD-EPI Creatinine Equation (2021)    Anion gap 9 5 - 15  Comment: Performed at [facility], [address] [place], [place] [address]  I-Stat CG4 Lactic Acid     Status: Abnormal   Collection Time: 07/15/23  5:19 PM  Result Value Ref Range   Lactic Acid, Venous 3.1 (HH) 0.5 - 1.9 mmol/L   Comment NOTIFIED PHYSICIAN   Urinalysis, Routine w reflex microscopic -Urine, Catheterized     Status: Abnormal   Collection Time: 07/15/23  7:14 PM  Result Value Ref Range   Color, Urine AMBER (A) YELLOW    Comment: BIOCHEMICALS MAY BE AFFECTED BY COLOR   APPearance CLOUDY (A) CLEAR   Specific Gravity, Urine 1.016 1.005 - 1.030   pH 6.0 5.0 - 8.0    Glucose, UA NEGATIVE NEGATIVE mg/dL   Hgb urine dipstick NEGATIVE NEGATIVE   Bilirubin Urine NEGATIVE NEGATIVE   Ketones, ur 5 (A) NEGATIVE mg/dL   Protein, ur NEGATIVE NEGATIVE mg/dL   Nitrite NEGATIVE NEGATIVE   Leukocytes,Ua LARGE (A) NEGATIVE   RBC / HPF 0-5 0 - 5 RBC/hpf   WBC, UA >50 0 - 5 WBC/hpf   Bacteria, UA MANY (A) NONE SEEN   Squamous Epithelial / HPF 0-5 0 - 5 /HPF   Mucus PRESENT     Comment: Performed at [facility], [address], [place], [place] [address]  Lactic acid, plasma     Status: Abnormal   Collection Time: 07/15/23 11:28 PM  Result Value Ref Range   Lactic Acid, Venous >9.0 (HH) 0.5 - 1.9 mmol/L    Comment: CRITICAL RESULT CALLED TO, READ BACK BY AND VERIFIED WITH Janeice Robinson, RN 07/16/23 0018 BY K. DAVIS Performed at [facility], [address], [place], [place] [address]   Blood gas, arterial     Status: Abnormal   Collection Time: 07/16/23  1:22 AM  Result Value Ref Range   O2 Content ROOM AIR L/min   pH, Arterial 7.4 7.35 - 7.45   pCO2 arterial 34 32 - 48 mmHg   pO2, Arterial 82 (L) 83 - 108 mmHg   Bicarbonate 21.2 20.0 - 28.0 mmol/L   Acid-base deficit 3.1 (H) 0.0 - 2.0 mmol/L   O2 Saturation 99.8 %   Patient temperature 36.5    Collection site RIGHT RADIAL    Drawn by [id number]    Allens test (pass/fail) PASS PASS    Comment: Performed at [facility], [address] [place], [place] [address]   DG Chest 2 View Result Date: 07/15/2023 CLINICAL DATA:  Altered mental status. EXAM: CHEST - 2 VIEW COMPARISON:  Radiograph 07/04/2023, CT 02/22/2022 FINDINGS: Chronic elevation of right hemidiaphragm. Stable heart size and mediastinal contours. Stimulator with lead coursing into the neck, unchanged. No acute airspace disease, pleural effusion, or pneumothorax. Stable osseous structures. IMPRESSION: No acute findings. Electronically Signed   By: Narda Rutherford M.D.   On: 07/15/2023 16:46   CT  Head Wo Contrast Result Date: 07/15/2023 CLINICAL DATA:  Provided history: Mental status change, unknown cause. Lethargy. Headache. History of dementia. Recent UTI. EXAM: CT HEAD WITHOUT CONTRAST TECHNIQUE: Contiguous axial images were obtained from the base of the skull through the vertex without intravenous contrast. RADIATION DOSE REDUCTION: This exam was performed according to the departmental dose-optimization program which includes automated exposure control, adjustment of the mA and/or kV according to patient size and/or use of iterative reconstruction technique. COMPARISON:  Prior head CT examinations 07/04/2023 and earlier. FINDINGS: Brain: Generalized cerebral and cerebellar atrophy. Chronic lacunar infarcts again  demonstrated within/about the bilateral basal ganglia. Background moderate patchy and ill-defined hypoattenuation within the cerebral white matter, nonspecific but compatible chronic small vessel ischemic disease. There is no acute intracranial hemorrhage. No demarcated cortical infarct. No extra-axial fluid collection. No evidence of an intracranial mass. No midline shift. Vascular: No hyperdense vessel.  Atherosclerotic calcifications. Skull: No calvarial fracture or aggressive osseous lesion. Sinuses/Orbits: No mass or acute finding within the imaged orbits. No significant paranasal sinus disease at the imaged levels. IMPRESSION: 1. No evidence of an acute intracranial abnormality. 2. 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 3. Background moderate cerebral white matter chronic small vessel ischemic disease. 4. Generalized parenchymal atrophy. Electronically Signed   By: Jackey Loge D.O.   On: 07/15/2023 15:32    Pending Labs Unresulted Labs (From admission, onward)     Start     Ordered   07/16/23 0500  CBC  Tomorrow morning,   R        07/15/23 2128   07/16/23 0500  Basic metabolic panel  Tomorrow morning,   R        07/15/23 2128   07/16/23 0122  Basic  metabolic panel  Once,   R        07/16/23 0121   07/15/23 2152  Lactic acid, plasma  (Lactic Acid)  STAT Now then every 3 hours,   R      07/15/23 2151   07/15/23 2002  Urine Culture  Add-on,   AD       Question:  Indication  Answer:  Altered mental status (if no other cause identified)   07/15/23 2001            Vitals/Pain Today's Vitals   07/16/23 0110 07/16/23 0135 07/16/23 0145 07/16/23 0200  BP: (!) 133/52 119/74 (!) 137/102 101/63  Pulse: 70 84 91 79  Resp: 13 (!) 21 17 17   Temp:  (!) 97.5 F (36.4 C)    TempSrc:  Oral    SpO2: 97% 93% 97% 98%  Weight:      PainSc:        Isolation Precautions No active isolations  Medications Medications  amLODipine (NORVASC) tablet 5 mg (has no administration in time range)  atorvastatin (LIPITOR) tablet 20 mg (20 mg Oral Given 07/15/23 2212)  donepezil (ARICEPT) tablet 10 mg (10 mg Oral Given 07/15/23 2211)  LORazepam (ATIVAN) tablet 0.5 mg (0.5 mg Oral Given 07/15/23 2211)  QUEtiapine (SEROQUEL) tablet 100 mg (100 mg Oral Given 07/15/23 2211)  clopidogrel (PLAVIX) tablet 75 mg (has no administration in time range)  lamoTRIgine (LAMICTAL) tablet 200 mg (200 mg Oral Given 07/15/23 2211)  enoxaparin (LOVENOX) injection 30 mg (30 mg Subcutaneous Given 07/15/23 2213)  acetaminophen (TYLENOL) tablet 650 mg (has no administration in time range)    Or  acetaminophen (TYLENOL) suppository 650 mg (has no administration in time range)  ondansetron (ZOFRAN) tablet 4 mg (has no administration in time range)    Or  ondansetron (ZOFRAN) injection 4 mg (has no administration in time range)  oxyCODONE (Oxy IR/ROXICODONE) immediate release tablet 5 mg (5 mg Oral Given 07/15/23 2213)  QUEtiapine (SEROQUEL) tablet 50 mg (has no administration in time range)  lactated ringers infusion ( Intravenous New Bag/Given 07/15/23 2303)  sodium zirconium cyclosilicate (LOKELMA) packet 10 g (10 g Oral Not Given 07/15/23 2212)  ertapenem (INVANZ) 1 g in sodium  chloride 0.9 % 100 mL IVPB (0 g Intravenous Stopped 07/15/23 2303)  0.9 %  sodium chloride  infusion (0 mLs Intravenous Hold 07/16/23 0111)  vancomycin (VANCOREADY) IVPB 750 mg/150 mL (has no administration in time range)  sodium chloride 0.9 % bolus 500 mL (0 mLs Intravenous Stopped 07/15/23 1713)  sodium zirconium cyclosilicate (LOKELMA) packet 10 g (10 g Oral Given 07/15/23 1927)  lactated ringers bolus 500 mL (0 mLs Intravenous Stopped 07/15/23 2008)  cefTRIAXone (ROCEPHIN) 1 g in sodium chloride 0.9 % 100 mL IVPB (0 g Intravenous Stopped 07/15/23 2040)  sodium chloride 0.9 % bolus 500 mL (0 mLs Intravenous Stopped 07/15/23 2236)  lactated ringers bolus 1,000 mL (0 mLs Intravenous Stopped 07/16/23 0254)  vancomycin (VANCOCIN) IVPB 1000 mg/200 mL premix (0 mg Intravenous Stopped 07/16/23 0254)  lactated ringers bolus 1,000 mL (0 mLs Intravenous Stopped 07/16/23 0254)    Mobility walks with device     Focused Assessments See Chart   R Recommendations: See Admitting Provider Note  Report given to:   Additional Notes: See Chart

## 2023-07-16 NOTE — ED Notes (Signed)
 Bladder scan volume 916

## 2023-07-16 NOTE — Progress Notes (Signed)
 Pharmacy Antibiotic Note  Cindy Padilla is a 83 y.o. female admitted on 07/15/2023 with  evaluation of lethargy, progressive declining since UTI two weeks ago. Marland Kitchen  Pharmacy has been consulted to dose vancomycin for sepsis.  Plan: Vancomycin 1gm IV x 1 then 750mg  q48h (AUC 513, Scr 1.55) Follow renal function, cultures and clinical course  Weight: 70.3 kg (154 lb 15.7 oz)  Temp (24hrs), Avg:98.2 F (36.8 C), Min:97.9 F (36.6 C), Max:98.6 F (37 C)  Recent Labs  Lab 07/15/23 1523 07/15/23 1535 07/15/23 1710 07/15/23 1719 07/15/23 2328  WBC 11.1*  --   --   --   --   CREATININE 1.60*  --  1.55*  --   --   LATICACIDVEN  --  2.7*  --  3.1* >9.0*    Estimated Creatinine Clearance: 24.1 mL/min (A) (by C-G formula based on SCr of 1.55 mg/dL (H)).    Allergies  Allergen Reactions   Sulfa Antibiotics Other (See Comments)    Unknown reaction  Listed as an allergy on MAR  Other Reaction(s): Other (See Comments), Unknown    Unknown reaction Listed as an allergy on MAR   Iodine Other (See Comments)    Unknown reaction Listed as an allergy on MAR   Ms Contin [Morphine] Other (See Comments)    Unknown reaction Listed as an allergy on MAR   Aminoglycosides Other (See Comments)    Unknown reaction  Listed as an allergy on MAR   Aspirin Other (See Comments)    Unknown reaction  Listed as an allergy on MAR   Codeine Other (See Comments)    Unknown reaction Listed as an allergy on MAR   Ivp Dye [Iodinated Contrast Media] Other (See Comments)    Unknown reaction Listed as an allergy on MAR   Neomycin Other (See Comments)    Unknown reaction Listed as an allergy on MAR   Penicillins Rash and Other (See Comments)    Not listed on MAR  Has patient had a PCN reaction causing immediate rash, facial/tongue/throat swelling, SOB or lightheadedness with hypotension: No Has patient had a PCN reaction causing severe rash involving mucus membranes or skin necrosis: No Has patient  had a PCN reaction that required hospitalization No Has patient had a PCN reaction occurring within the last 10 years: No If all of the above answers are "NO", then may proceed with Cephalosporin use.   Phenothiazines Other (See Comments)    Unknown reaction Listed as an allergy on MAR   Promethazine Other (See Comments)    Unknown reaction Not listed on MAR   Tetracyclines & Related Other (See Comments)    Unknown reaction Listed as an allergy on MAR    Antimicrobials this admission: 2/18 CTX x1 2/18 ertapenem>> 2/19 vanc >>  Dose adjustments this admission:   Microbiology results: 2/18 UCx:     Thank you for allowing pharmacy to be a part of this patient's care.  Arley Phenix RPh 07/16/2023, 1:03 AM

## 2023-07-16 NOTE — Evaluation (Signed)
 Occupational Therapy Evaluation Patient Details Name: Cindy Padilla MRN: [id number] DOB: [birth year] Today's Date: 07/16/2023   History of Present Illness   Patient is a 83 year old female who presented with fatigue on 2/18. Patient was noted to have been diagnosed with influenza and UTI prior to admission.patient was admitted with community acquired bacterial pneumonia, acute encephalopathy, severe sepsis secondary to UTI and AKI.   PMH: bipolar, dementia, DM, GERD, IBS, HTN, COPD.     Clinical Impressions Patient is a 83 year old female who was admitted for above. Patient was living at memory care prior level with recent transition to w/c per chart review. Patient was unable to communicate PLOF at this time with increased confusion. Patient was unable to transition to EOB with increased pain with BLE movement and strong posterior leaning with patient unable to follow cues to correct posture and no +2 available at this time. Patient will need 24/7 caregiver support in next level of care. Patient was noted to have decreased functional activity tolernace, decreased ROM, decreased BUE strength, decreased endurance, decreased sitting balance, decreased standing balanced, decreased safety awareness, and decreased knowledge of AE/AD impacting participation in ADLs. Patient will benefit from continued inpatient follow up therapy, <3 hours/day.      If plan is discharge home, recommend the following:   Two people to help with walking and/or transfers;A lot of help with bathing/dressing/bathroom;Assistance with cooking/housework;Direct supervision/assist for medications management;Assist for transportation;Help with stairs or ramp for entrance;Direct supervision/assist for financial management;Assistance with feeding;Supervision due to cognitive status     Functional Status Assessment   Patient has had a recent decline in their functional status and demonstrates the ability to make significant  improvements in function in a reasonable and predictable amount of time.     Equipment Recommendations   None recommended by OT      Precautions/Restrictions   Precautions Precautions: Fall Recall of Precautions/Restrictions: Impaired Restrictions Weight Bearing Restrictions Per Provider Order: No Other Position/Activity Restrictions: wound on LLE     Mobility Bed Mobility Overal bed mobility: Needs Assistance             General bed mobility comments: unable to progress to EOB with only +1 and strong pushing response with increased pain in BLE with attempts.             ADL either performed or assessed with clinical judgement   ADL Overall ADL's : Needs assistance/impaired Eating/Feeding: Set up;Sitting;Minimal assistance Eating/Feeding Details (indicate cue type and reason): to hold water cup, noted to have coughing after small sip. water removed and nurse and SLP made aware. Grooming: Sitting;Maximal assistance   Upper Body Bathing: Bed level;Maximal assistance   Lower Body Bathing: Bed level;Total assistance   Upper Body Dressing : Bed level;Maximal assistance   Lower Body Dressing: Bed level;Total assistance     Toilet Transfer Details (indicate cue type and reason): unable to progress to EOB with increased pushing response Toileting- Clothing Manipulation and Hygiene: Bed level;Total assistance               Vision   Vision Assessment?: No apparent visual deficits            Pertinent Vitals/Pain Pain Assessment Pain Assessment: Faces Faces Pain Scale: Hurts even more Pain Location: BLE with attempts to move. Pain Descriptors / Indicators: Grimacing, Guarding, Constant Pain Intervention(s): Limited activity within patient's tolerance, Monitored during session     Extremity/Trunk Assessment Upper Extremity Assessment Upper Extremity Assessment: Difficult to assess due  to impaired cognition   Lower Extremity Assessment Lower  Extremity Assessment: Defer to PT evaluation (noted to have internal rotation of LLE in bed.)       Communication     Cognition Arousal: Lethargic Behavior During Therapy: Flat affect Cognition: Cognition impaired   Orientation impairments: Place, Time, Situation Awareness: Intellectual awareness impaired, Online awareness impaired Memory impairment (select all impairments): Short-term memory, Working [employer] fast streamer, Conservation officer, historic buildings, Non-declarative long-term memory Attention impairment (select first level of impairment): Focused attention Executive functioning impairment (select all impairments): Initiation, Organization, Sequencing, Reasoning, Problem solving                   Following commands: Impaired Following commands impaired: Follows one step commands inconsistently                Home Living Family/patient expects to be discharged to:: Skilled nursing facility                                 Additional Comments: resides at [facility]      Prior Functioning/Environment Prior Level of Function : Needs assist       Physical Assist : ADLs (physical)   ADLs (physical): Dressing;Toileting;Bathing   ADLs Comments: patient lives at memory care unclear level of assistance.    OT Problem List: Impaired balance (sitting and/or standing);Decreased knowledge of precautions;Pain;Decreased knowledge of use of DME or AE;Decreased activity tolerance;Decreased range of motion;Cardiopulmonary status limiting activity;Decreased safety awareness;Decreased cognition   OT Treatment/Interventions: Self-care/ADL training;DME and/or AE instruction;Therapeutic activities;Balance training;Therapeutic exercise;Patient/family education;Energy conservation      OT Goals(Current goals can be found in the care plan section)   Acute Rehab OT Goals Patient Stated Goal: none stated OT Goal Formulation: Patient unable to participate in goal setting Time  For Goal Achievement: 07/30/23 Potential to Achieve Goals: Fair   OT Frequency:  Min 1X/week       AM-PAC OT "6 Clicks" Daily Activity     Outcome Measure Help from another person eating meals?: A Lot Help from another person taking care of personal grooming?: Total Help from another person toileting, which includes using toliet, bedpan, or urinal?: Total Help from another person bathing (including washing, rinsing, drying)?: Total Help from another person to put on and taking off regular upper body clothing?: Total Help from another person to put on and taking off regular lower body clothing?: Total 6 Click Score: 7   End of Session Nurse Communication: Other (comment) (patients assist level and coughing with small sips)  Activity Tolerance: Patient limited by pain Patient left: in bed;with call bell/phone within reach;with bed alarm set  OT Visit Diagnosis: Unsteadiness on feet (R26.81);Pain;Muscle weakness (generalized) (M62.81);Other symptoms and signs involving cognitive function                Time: 1610-9604 OT Time Calculation (min): 12 min Charges:  OT General Charges $OT Visit: 1 Visit OT Evaluation $OT Eval Moderate Complexity: 1 Mod  Saidi Santacroce OTR/L, MS Acute Rehabilitation Department Office# [phone]   Selinda Flavin 07/16/2023, 10:46 AM

## 2023-07-16 NOTE — Progress Notes (Signed)
 Occupational therapy noted that the patient had some difficulty swallowing with coughing post swallowing. They let speech know and patient has a active consult pending. Will make patient npo until speech can see.

## 2023-07-17 DIAGNOSIS — J159 Unspecified bacterial pneumonia: Secondary | ICD-10-CM | POA: Diagnosis not present

## 2023-07-17 LAB — URINE CULTURE: Culture: >=100000 — AB

## 2023-07-17 LAB — CREATININE, SERUM
Creatinine, Ser: 0.46 mg/dL (ref 0.44–1.00)
GFR, Estimated: >60 mL/min (ref >60)
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
## 2023-07-17 NOTE — Evaluation (Signed)
 Clinical/Bedside Swallow Evaluation Patient Details  Name: Cindy Padilla MRN: [id number] Date of Birth: 1941/05/14  Today's Date: 07/17/2023 Time: SLP Start Time (ACUTE ONLY): 4098 SLP Stop Time (ACUTE ONLY): 1009 SLP Time Calculation (min) (ACUTE ONLY): 17 min  Past Medical History:  Past Medical History:  Diagnosis Date   Arthritis    Bipolar 1 disorder (HCC)    Bronchitis    Chronic back pain    Constipation    COPD (chronic obstructive pulmonary disease) (HCC)    Dementia (HCC)    Diabetes mellitus    GERD (gastroesophageal reflux disease)    Hiatal hernia    Hypertension    IBS (irritable bowel syndrome)    Past Surgical History:  Past Surgical History:  Procedure Laterality Date   ESOPHAGOGASTRODUODENOSCOPY (EGD) WITH PROPOFOL N/A 04/14/2020   Procedure: ESOPHAGOGASTRODUODENOSCOPY (EGD) WITH PROPOFOL;  Surgeon: Napoleon Form, MD;  Location: [facility];  Service: Endoscopy;  Laterality: N/A;   gallbladder     MANDIBLE SURGERY     PARTIAL HYSTERECTOMY     HPI:  Patient is a 83 year old female who presented with fatigue on 2/18. Patient was noted to have been diagnosed with influenza and UTI prior to admission.patient was admitted with community acquired bacterial pneumonia, acute encephalopathy, severe sepsis secondary to UTI and AKI.   PMH: bipolar, dementia, DM, GERD, IBS, HTN, COPD.Marland Kitchen CT head without contrast with no acute findings.  CXR 2/18 without acute findings. Etiology of her encephalopathy likely multifactorial with severe sepsis, lactic acidosis, ESBL E. coli UTI, adult failure to thrive with poor oral intake, acute renal failure.    Assessment / Plan / Recommendation  Clinical Impression  Patient presents with swallowing function consistent with that during bedside swallow evaluation in 2018 during admission for COPD. She has a baseline wheezy congested cough which continued but does not increase during po intake nor does it present directly during  or after the swallow. Despite coughing, oropharyngeal function noted to be otherwise WNL based on palpation with what appears to be timely swallow initiation and appropriate hyolaryngeal movement. She complained of cracker being "too dry" and ultlimately expectorated bolus but was able to appropriately orally transit and clear pureed solid bolus. Do not suspect that patient is aspirating at this time however baseline coughing does make diagnostics at bedside more challenging. Note that CXR is negative despite possible bacterial PNA dx. Will modify diet for solids and follow up briefly for tolerance. SLP Visit Diagnosis: Dysphagia, unspecified (R13.10)       Diet Recommendation Thin liquid;Dysphagia 2 (Fine chop)    Liquid Administration via: Cup;Straw Medication Administration: Whole meds with liquid Supervision: Patient able to self feed;Intermittent supervision to cue for compensatory strategies Compensations: Slow rate;Small sips/bites Postural Changes: Seated upright at 90 degrees    Other  Recommendations Oral Care Recommendations: Oral care BID    Recommendations for follow up therapy are one component of a multi-disciplinary discharge planning process, led by the attending physician.  Recommendations may be updated based on patient status, additional functional criteria and insurance authorization.  Follow up Recommendations No SLP follow up         Functional Status Assessment Patient has had a recent decline in their functional status and demonstrates the ability to make significant improvements in function in a reasonable and predictable amount of time.  Frequency and Duration min 1 x/week  1 week       Prognosis Prognosis for improved oropharyngeal function: Good Barriers to Reach Goals:  Cognitive deficits      Swallow Study   General HPI: Patient is a 83 year old female who presented with fatigue on 2/18. Patient was noted to have been diagnosed with influenza and UTI  prior to admission.patient was admitted with community acquired bacterial pneumonia, acute encephalopathy, severe sepsis secondary to UTI and AKI.   PMH: bipolar, dementia, DM, GERD, IBS, HTN, COPD.Marland Kitchen CT head without contrast with no acute findings.  CXR 2/18 without acute findings. Etiology of her encephalopathy likely multifactorial with severe sepsis, lactic acidosis, ESBL E. coli UTI, adult failure to thrive with poor oral intake, acute renal failure. Type of Study: Bedside Swallow Evaluation Previous Swallow Assessment: 2018-seen at bedside,see impression statement Diet Prior to this Study: NPO Temperature Spikes Noted: No Respiratory Status: Room air History of Recent Intubation: No Behavior/Cognition: Alert;Cooperative;Pleasant mood Oral Cavity Assessment: Within Functional Limits Oral Care Completed by SLP: No Oral Cavity - Dentition: Dentures, bottom;Dentures, top Vision: Functional for self-feeding Self-Feeding Abilities: Able to feed self Patient Positioning: Upright in bed Baseline Vocal Quality: Normal Volitional Cough: Congested Volitional Swallow: Able to elicit    Oral/Motor/Sensory Function Overall Oral Motor/Sensory Function: Within functional limits   Ice Chips Ice chips: Not tested   Thin Liquid Thin Liquid: Within functional limits Presentation: Cup;Self Fed;Straw    Nectar Thick Nectar Thick Liquid: Not tested   Honey Thick Honey Thick Liquid: Not tested   Puree Puree: Within functional limits Presentation: Spoon   Solid     Solid: Impaired Presentation: Self Fed Oral Phase Impairments: Impaired mastication Oral Phase Functional Implications: Prolonged oral transit;Other (comment) (complained of cracker being "too dry" and expectorated bolus) Pharyngeal Phase Impairments:  (see impression statement)     Lyncoln Ledgerwood MA, CCC-SLP  Analyse Angst Meryl 07/17/2023,10:15 AM

## 2023-07-17 NOTE — [place] FL2 (Signed)
 [place] MEDICAID FL2 LEVEL OF CARE FORM     IDENTIFICATION  Patient Name: Cindy Padilla Birthdate: May 18, 1941 Sex: female Admission Date (Current Location): 07/15/2023  [facility] and [place] Number:  Producer, television/film/video and Address:  [facility],  [address] [place], [place] [address]      Provider Number: [id number]  Attending Physician Name and Address:  [place], Eric J, DO  Relative Name and Phone Number:  daughter, Leonides Sake @ [phone]    Current Level of Care: SNF Recommended Level of Care: Skilled Nursing Facility Prior Approval Number:    Date Approved/Denied:   PASRR Number: [id number]  Discharge Plan: SNF    Current Diagnoses: Patient Active Problem List   Diagnosis Date Noted   Sepsis (HCC) 07/16/2023   Community acquired bacterial pneumonia 07/15/2023   Acute metabolic encephalopathy 05/28/2023   RSV bronchitis 05/24/2023   Melena    Heme positive stool    Acute GI bleeding 04/13/2020   Near syncope 10/15/2019   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   BRBPR (bright red blood per rectum) 04/28/2019   Acute exacerbation of chronic obstructive pulmonary disease (COPD) (HCC) 02/26/2017   COPD with acute exacerbation (HCC) 02/24/2017   Bipolar 1 disorder (HCC) 02/24/2017   Normocytic anemia 02/24/2017   Morbid obesity (HCC) 04/14/2015   Upper airway cough syndrome 04/14/2015   Ventral hernia 04/24/2014   Constipation 04/24/2014   Hemorrhoids 04/22/2014   Abnormality of gait 09/30/2013   Dyskinesia, 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   Chronic diastolic CHF (congestive heart failure) (HCC) 07/27/2013   Dementia with behavioral disturbance (HCC) 07/27/2013   Weakness 02/09/2013   Hyponatremia 02/09/2013   Shortness of breath 02/09/2013   HTN (hypertension) 02/09/2013   Other and unspecified hyperlipidemia 02/09/2013    Orientation RESPIRATION BLADDER Height & Weight     Self  Normal  Incontinent Weight: 139 lb 8.8 oz (63.3 kg) Height:  5' (152.4 cm)  BEHAVIORAL SYMPTOMS/MOOD NEUROLOGICAL BOWEL NUTRITION STATUS      Incontinent Diet (Dys 2, thin liquids)  AMBULATORY STATUS COMMUNICATION OF NEEDS Skin   Extensive Assist Verbally Other (Comment) (1.  Pressure Injury Sacrum Right;Left;Mid Stage 1 -  Intact skin with non-blanchable redness of a localized area usually over a bony prominence.  2. Avulsion Pretibial Left;Anterior;Lower Large avulsion with skin flap replaced with dressing)                       Personal Care Assistance Level of Assistance  Bathing, Feeding, Dressing, Total care Bathing Assistance: Limited assistance Feeding assistance: Limited assistance Dressing Assistance: Limited assistance Total Care Assistance: Maximum assistance   Functional Limitations Info  Sight, Hearing, Speech Sight Info: Adequate Hearing Info: Adequate Speech Info: Adequate    SPECIAL CARE FACTORS FREQUENCY  PT (By licensed PT), OT (By licensed OT)     PT Frequency: 5x/wk OT Frequency: 5x/wk            Contractures Contractures Info: Not present    Additional Factors Info  Code Status, Allergies, Psychotropic Code Status Info: DNR Allergies Info: Sulfa Antibiotics, Iodine, Ms Contin (Morphine), Aminoglycosides, Aspirin, Codeine, Ivp Dye (Iodinated Contrast Media), Neomycin, Penicillins, Phenothiazines, Promethazine, Tetracyclines & Related Psychotropic Info: see MAR         Current Medications (07/17/2023):  This is the current hospital active medication list Current Facility-Administered Medications  Medication Dose Route Frequency Provider Last Rate Last Admin   acetaminophen (TYLENOL) tablet 650 mg  650 mg Oral Q6H PRN Alan Mulder, MD       Or   acetaminophen (TYLENOL) suppository 650 mg  650 mg Rectal Q6H PRN Alan Mulder, MD       amLODipine (NORVASC) tablet 5 mg  5 mg Oral Daily Dorrell, Robert, MD   5 mg at 07/17/23 1104   atorvastatin  (LIPITOR) tablet 20 mg  20 mg Oral QPM Alan Mulder, MD   20 mg at 07/15/23 2212   Chlorhexidine Gluconate Cloth 2 % PADS 6 each  6 each Topical Daily Alan Mulder, MD   6 each at 07/17/23 1041   clopidogrel (PLAVIX) tablet 75 mg  75 mg Oral Daily Alan Mulder, MD   75 mg at 07/17/23 1104   donepezil (ARICEPT) tablet 10 mg  10 mg Oral QHS Alan Mulder, MD   10 mg at 07/15/23 2211   enoxaparin (LOVENOX) injection 40 mg  40 mg Subcutaneous Q24H Cindi Carbon, RPH   40 mg at 07/16/23 2218   ertapenem [facility]) 1 g in sodium chloride 0.9 % 100 mL IVPB  1 g Intravenous Q24H Alan Mulder, MD 200 mL/hr at 07/16/23 2218 1 g at 07/16/23 2218   fluticasone furoate-vilanterol (BREO ELLIPTA) 200-25 MCG/ACT 1 puff  1 puff Inhalation Daily [place], Eric J, DO       lamoTRIgine (LAMICTAL) tablet 200 mg  200 mg Oral BID Alan Mulder, MD   200 mg at 07/17/23 1103   LORazepam (ATIVAN) tablet 0.5 mg  0.5 mg Oral BID Alan Mulder, MD   0.5 mg at 07/17/23 1104   montelukast (SINGULAIR) tablet 10 mg  10 mg Oral QHS [place], Eric J, DO       ondansetron [facility] tablet 4 mg  4 mg Oral Q6H PRN Alan Mulder, MD       Or   ondansetron (ZOFRAN) injection 4 mg  4 mg Intravenous Q6H PRN Alan Mulder, MD       oxyCODONE (Oxy IR/ROXICODONE) immediate release tablet 5 mg  5 mg Oral Q4H PRN Alan Mulder, MD   5 mg at 07/15/23 2213   QUEtiapine (SEROQUEL) tablet 100 mg  100 mg Oral QHS Alan Mulder, MD   100 mg at 07/15/23 2211   QUEtiapine (SEROQUEL) tablet 50 mg  50 mg Oral Daily Alan Mulder, MD   50 mg at 07/17/23 1103   umeclidinium bromide (INCRUSE ELLIPTA) 62.5 MCG/ACT 1 puff  1 puff Inhalation Daily [place], Eric J, DO       vancomycin (VANCOCIN) 750 mg in sodium chloride 0.9 % 250 mL IVPB  750 mg Intravenous Q24H Pricilla Riffle, RPH 250 mL/hr at 07/17/23 0834 750 mg at 07/17/23 1610     Discharge Medications: Please see discharge summary for a list of discharge  medications.  Relevant Imaging Results:  Relevant Lab Results:   Additional Information SSN [id number]  Amada Jupiter, LCSW

## 2023-07-17 NOTE — Evaluation (Signed)
 Physical Therapy Evaluation Patient Details Name: Cindy Padilla MRN: [id number] DOB: November 03, 1940 Today's Date: 07/17/2023  History of Present Illness  Patient is a 83 year old female who presented from memory care unit  with weakness, CAP on 07/15/23. Patient was noted to have been diagnosed with influenza and UTI 07/04/23 .patient was admitted with acute encephalopathy, severe sepsis secondary to UTI and AKI.   PMH: bipolar, dementia, DM, GERD, IBS, HTN, COPD.  Clinical Impression  +2 total assist for supine to sit, +2 max assist for sit to stand, pt only able to stand for ~15 seconds with RW before BLEs fatigued. Assisted pt back to bed for pericare as she was noted to be incontinent of stool. Mechanical lift recommended for transfers.         If plan is discharge home, recommend the following: Two people to help with walking and/or transfers;Assistance with cooking/housework;Two people to help with bathing/dressing/bathroom;Direct supervision/assist for medications management;Assist for transportation;Help with stairs or ramp for entrance;Direct supervision/assist for financial management   Can travel by private vehicle   No    Equipment Recommendations None recommended by PT  Recommendations for Other Services       Functional Status Assessment Patient has had a recent decline in their functional status and demonstrates the ability to make significant improvements in function in a reasonable and predictable amount of time.     Precautions / Restrictions Precautions Precautions: Fall Recall of Precautions/Restrictions: Impaired Restrictions Weight Bearing Restrictions Per Provider Order: No Other Position/Activity Restrictions: wound on LLE      Mobility  Bed Mobility Overal bed mobility: Needs Assistance Bed Mobility: Supine to Sit, Sit to Supine     Supine to sit: +2 for physical assistance, Total assist Sit to supine: +2 for physical assistance, Max assist    General bed mobility comments: assist to raise trunk and pivot hips to edge of bed    Transfers Overall transfer level: Needs assistance Equipment used: Rolling walker (2 wheels) Transfers: Sit to/from Stand Sit to Stand: Max assist, +2 physical assistance           General transfer comment: assist to power up, pt not able to stand for more than ~15 seconds before BLEs buckled. Pt noted to be soiled with BM so assisted pt back to bed for pericare.    Ambulation/Gait               General Gait Details: unable  Stairs            Wheelchair Mobility     Tilt Bed    Modified Rankin (Stroke Patients Only)       Balance Overall balance assessment: Needs assistance, History of Falls Sitting-balance support: Feet supported, Single extremity supported Sitting balance-Leahy Scale: Poor Sitting balance - Comments: requires single UE and BLE support   Standing balance support: Bilateral upper extremity supported, Reliant on assistive device for balance, During functional activity Standing balance-Leahy Scale: Poor Standing balance comment: mod assist for standing balance with RW                             Pertinent Vitals/Pain Pain Assessment Faces Pain Scale: Hurts little more Pain Location: BLE with attempts to move. Pain Descriptors / Indicators: Grimacing, Guarding Pain Intervention(s): Limited activity within patient's tolerance, Monitored during session, Repositioned    Home Living Family/patient expects to be discharged to:: Skilled nursing facility  Additional Comments: resides at [facility]    Prior Function Prior Level of Function : Needs assist;Patient poor historian/Family not available       Physical Assist : ADLs (physical)   ADLs (physical): Dressing;Toileting;Bathing Mobility Comments: patient lives at memory care unclear level of assistance. H&P stated pt walked with a RW until 2/7 when she  started using a WC       Extremity/Trunk Assessment        Lower Extremity Assessment Lower Extremity Assessment: Generalized weakness;RLE deficits/detail;LLE deficits/detail RLE Deficits / Details: knee ext -4/5 RLE: Unable to fully assess due to pain LLE Deficits / Details: knee ext -4/5 LLE: Unable to fully assess due to pain    Cervical / Trunk Assessment Cervical / Trunk Assessment: Kyphotic  Communication   Communication Communication: No apparent difficulties    Cognition Arousal: Alert Behavior During Therapy: WFL for tasks assessed/performed   PT - Cognitive impairments: No family/caregiver present to determine baseline, Memory, Orientation   Orientation impairments: Time, Situation                   PT - Cognition Comments: stated it is February 2022, oriented to self and knows she's in the hospital   Following commands impaired: Follows one step commands with increased time     Cueing Cueing Techniques: Verbal cues, Gestural cues     General Comments      Exercises     Assessment/Plan    PT Assessment Patient needs continued PT services  PT Problem List Decreased activity tolerance;Decreased strength;Decreased mobility;Decreased balance       PT Treatment Interventions DME instruction;Gait training;Therapeutic activities;Therapeutic exercise;Functional mobility training;Patient/family education    PT Goals (Current goals can be found in the Care Plan section)  Acute Rehab PT Goals PT Goal Formulation: Patient unable to participate in goal setting Time For Goal Achievement: 07/31/23 Potential to Achieve Goals: Good    Frequency Min 1X/week     Co-evaluation               AM-PAC PT "6 Clicks" Mobility  Outcome Measure Help needed turning from your back to your side while in a flat bed without using bedrails?: Total Help needed moving from lying on your back to sitting on the side of a flat bed without using bedrails?:  Total Help needed moving to and from a bed to a chair (including a wheelchair)?: Total Help needed standing up from a chair using your arms (e.g., wheelchair or bedside chair)?: Total Help needed to walk in hospital room?: Total Help needed climbing 3-5 steps with a railing? : Total 6 Click Score: 6    End of Session Equipment Utilized During Treatment: Gait belt Activity Tolerance: Patient limited by fatigue Patient left: in bed;with nursing/sitter in room;with call bell/phone within reach Nurse Communication: Mobility status;Need for lift equipment PT Visit Diagnosis: Difficulty in walking, not elsewhere classified (R26.2);History of falling (Z91.81)    Time: 1610-9604 PT Time Calculation (min) (ACUTE ONLY): 14 min   Charges:   PT Evaluation $PT Eval Moderate Complexity: 1 Mod   PT General Charges $$ ACUTE PT VISIT: 1 Visit         Tamala Ser PT 07/17/2023  Acute Rehabilitation Services  Office [phone]

## 2023-07-17 NOTE — TOC Initial Note (Signed)
 Transition of Care [facility]) - Initial/Assessment Note    Patient Details  Name: Cindy Padilla MRN: [id number] Date of Birth: Mar 07, 1941  Transition of Care [facility]) CM/SW Contact:    Amada Jupiter, LCSW Phone Number: 07/17/2023, 2:58 PM  Clinical Narrative:                 Spoke with pt's daughter, Darl Pikes, today who confirms pt admitted here from [facility] where she has been a resident since 2013.  Daughter aware of pt's current level of debility and adds that pt had been declining for ~ a week prior to admission.  At baseline, pt is mod ind with RW for mobility but had been requiring a w/c for ~ the last week at the facility.  Daughter aware and agreeable with therapy recommendation for SNF to attempt to regain some overall functioning and strength.  Will begin SNF bed search today.  Expected Discharge Plan: Skilled Nursing Facility Barriers to Discharge: Continued Medical Work up, SNF Pending bed offer   Patient Goals and CMS Choice Patient states their goals for this hospitalization and ongoing recovery are:: rehab          Expected Discharge Plan and Services In-house Referral: Clinical Social Work   Post Acute Care Choice: Skilled Nursing Facility Living arrangements for the past 2 months:  (Memory Care at [facility])                 DME Arranged: N/A DME Agency: NA                  Prior Living Arrangements/Services Living arrangements for the past 2 months:  (Memory Care at [facility]) Lives with:: Facility Resident Patient language and need for interpreter reviewed:: Yes Do you feel safe going back to the place where you live?: Yes      Need for Family Participation in Patient Care: No (Comment) Care giver support system in place?: Yes (comment)   Criminal Activity/Legal Involvement Pertinent to Current Situation/Hospitalization: No - Comment as needed  Activities of Daily Living   ADL Screening (condition at time of  admission) Independently performs ADLs?: No Does the patient have a NEW difficulty with bathing/dressing/toileting/self-feeding that is expected to last >3 days?: Yes (Initiates electronic notice to provider for possible OT consult) Does the patient have a NEW difficulty with getting in/out of bed, walking, or climbing stairs that is expected to last >3 days?: Yes (Initiates electronic notice to provider for possible PT consult) Does the patient have a NEW difficulty with communication that is expected to last >3 days?: Yes (Initiates electronic notice to provider for possible SLP consult) Is the patient deaf or have difficulty hearing?: No Does the patient have difficulty seeing, even when wearing glasses/contacts?: No Does the patient have difficulty concentrating, remembering, or making decisions?: Yes  Permission Sought/Granted Permission sought to share information with : Family Supports Permission granted to share information with : Yes, Verbal Permission Granted  Share Information with NAME: daughter, Leonides Sake @ [phone]           Emotional Assessment Appearance:: Appears stated age Attitude/Demeanor/Rapport: Unable to Assess Affect (typically observed): Unable to Assess Orientation: : Oriented to Self Alcohol / Substance Use: Not Applicable Psych Involvement: No (comment)  Admission diagnosis:  Hyperkalemia [E87.5] Influenza A [J10.1] Community acquired bacterial pneumonia [J15.9] Elevated lactic acid level [R79.89] Sepsis (HCC) [A41.9] Urinary tract infection without hematuria, site unspecified [N39.0] Patient Active Problem List   Diagnosis Date Noted  Sepsis (HCC) 07/16/2023   Community acquired bacterial pneumonia 07/15/2023   Acute metabolic encephalopathy 05/28/2023   RSV bronchitis 05/24/2023   Melena    Heme positive stool    Acute GI bleeding 04/13/2020   Near syncope 10/15/2019   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   BRBPR (bright red  blood per rectum) 04/28/2019   Acute exacerbation of chronic obstructive pulmonary disease (COPD) (HCC) 02/26/2017   COPD with acute exacerbation (HCC) 02/24/2017   Bipolar 1 disorder (HCC) 02/24/2017   Normocytic anemia 02/24/2017   Morbid obesity (HCC) 04/14/2015   Upper airway cough syndrome 04/14/2015   Ventral hernia 04/24/2014   Constipation 04/24/2014   Hemorrhoids 04/22/2014   Abnormality of gait 09/30/2013   Dyskinesia, 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   Chronic diastolic CHF (congestive heart failure) (HCC) 07/27/2013   Dementia with behavioral disturbance (HCC) 07/27/2013   Weakness 02/09/2013   Hyponatremia 02/09/2013   Shortness of breath 02/09/2013   HTN (hypertension) 02/09/2013   Other and unspecified hyperlipidemia 02/09/2013   PCP:  Valere Dross, FNP Pharmacy:  No Pharmacies Listed    Social Drivers of Health (SDOH) Social History: SDOH Screenings   Food Insecurity: No Food Insecurity (05/24/2023)  Housing: Low Risk  (05/24/2023)  Transportation Needs: No Transportation Needs (05/24/2023)  Utilities: Not At Risk (05/24/2023)  Social Connections: Moderately Integrated (05/27/2023)  Tobacco Use: Medium Risk (07/17/2023)   SDOH Interventions:     Readmission Risk Interventions    07/17/2023    2:56 PM  Readmission Risk Prevention Plan  Transportation Screening Complete  PCP or Specialist Appt within 3-5 Days Complete  HRI or Home Care Consult Complete  Social Work Consult for Recovery Care Planning/Counseling Complete

## 2023-07-18 DIAGNOSIS — J159 Unspecified bacterial pneumonia: Secondary | ICD-10-CM | POA: Diagnosis not present

## 2023-07-18 LAB — CBC
HCT: 26 % — ABNORMAL LOW (ref 36.0–46.0)
Hemoglobin: 7.8 g/dL — ABNORMAL LOW (ref 12.0–15.0)
MCH: 30.6 pg (ref 26.0–34.0)
MCHC: 30 g/dL (ref 30.0–36.0)
MCV: 102 fL — ABNORMAL HIGH (ref 80.0–100.0)
Platelets: 294 10*3/uL (ref 150–400)
RBC: 2.55 MIL/uL — ABNORMAL LOW (ref 3.87–5.11)
RDW: 20.6 % — ABNORMAL HIGH (ref 11.5–15.5)
WBC: 5.8 10*3/uL (ref 4.0–10.5)
nRBC: 0 % (ref 0.0–0.2)

## 2023-07-18 LAB — BASIC METABOLIC PANEL
Anion gap: 12 (ref 5–15)
BUN: 9 mg/dL (ref 8–23)
CO2: 23 mmol/L (ref 22–32)
Calcium: 7.9 mg/dL — ABNORMAL LOW (ref 8.9–10.3)
Chloride: 105 mmol/L (ref 98–111)
Creatinine, Ser: 0.39 mg/dL — ABNORMAL LOW (ref 0.44–1.00)
GFR, Estimated: >60 mL/min (ref >60)
Glucose, Bld: 61 mg/dL — ABNORMAL LOW (ref 70–99)
Potassium: 2.4 mmol/L — CL (ref 3.5–5.1)
Sodium: 140 mmol/L (ref 135–145)

## 2023-07-18 LAB — RENAL FUNCTION PANEL
Albumin: 2.1 g/dL — ABNORMAL LOW (ref 3.5–5.0)
Anion gap: 9 (ref 5–15)
BUN: 10 mg/dL (ref 8–23)
CO2: 26 mmol/L (ref 22–32)
Calcium: 8 mg/dL — ABNORMAL LOW (ref 8.9–10.3)
Chloride: 105 mmol/L (ref 98–111)
Creatinine, Ser: 0.54 mg/dL (ref 0.44–1.00)
GFR, Estimated: >60 mL/min (ref >60)
Glucose, Bld: 106 mg/dL — ABNORMAL HIGH (ref 70–99)
Phosphorus: 3.3 mg/dL (ref 2.5–4.6)
Potassium: 3.9 mmol/L (ref 3.5–5.1)
Sodium: 140 mmol/L (ref 135–145)

## 2023-07-18 LAB — MAGNESIUM
Magnesium: 2.1 mg/dL (ref 1.7–2.4)
Magnesium: 2.1 mg/dL (ref 1.7–2.4)

## 2023-07-18 LAB — GLUCOSE, CAPILLARY: Glucose-Capillary: 103 mg/dL — ABNORMAL HIGH (ref 70–99)

## 2023-07-18 LAB — PHOSPHORUS: Phosphorus: 1.7 mg/dL — ABNORMAL LOW (ref 2.5–4.6)

## 2023-07-18 LAB — LACTIC ACID, PLASMA: Lactic Acid, Venous: 1.5 mmol/L (ref 0.5–1.9)

## 2023-07-18 MED ORDER — POTASSIUM CHLORIDE IN NACL 20-0.9 MEQ/L-% IV SOLN
INTRAVENOUS | Status: DC
  Filled 2023-07-18 (×3): qty 1000

## 2023-07-18 MED ORDER — POTASSIUM CHLORIDE 10 MEQ/100ML IV SOLN
10.0000 meq | INTRAVENOUS | Status: AC
  Administered 2023-07-18 (×4): 10 meq via INTRAVENOUS
  Filled 2023-07-18 (×4): qty 100

## 2023-07-18 MED ORDER — POTASSIUM CHLORIDE IN NACL 20-0.9 MEQ/L-% IV SOLN
INTRAVENOUS | Status: DC
  Filled 2023-07-18: qty 1000

## 2023-07-18 MED ORDER — POTASSIUM PHOSPHATES 15 MMOLE/5ML IV SOLN
30.0000 mmol | Freq: Once | INTRAVENOUS | Status: AC
  Administered 2023-07-18: 30 mmol via INTRAVENOUS
  Filled 2023-07-18: qty 10
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
## 2023-07-18 NOTE — Progress Notes (Addendum)
 Speech Language Pathology Treatment: Dysphagia  Patient Details Name: Cindy Padilla MRN: [id number] DOB: [birth year] Today's Date: 07/18/2023 Time: 4098-1191 SLP Time Calculation (min) (ACUTE ONLY): 15 min  Assessment / Plan / Recommendation Clinical Impression  Pt seen for skilled ST intervention focused on follow up after BSE completed 07/17/23, to assess diet tolerance and continue education for maximizing safe swallow. Pt seated upright in bed with lunch tray present but largely untouched. Pt sleeping, but awakened easily with verbal stim.   Pt declined trials of lunch tray items, but did accept several boluses of Strawberry Ensure. No cough response noted following PO trials. Nursing reports poor PO intake as been ongoing. Pt indicated she did not want to be on pureed textures. Will continue Dys2/thin liquid to allow wider range of choices to facilitate PO intake but maintain energy conservation. Safe swallow precautions posted at [facility]. ST signing off at this time. Please reconsult if needs arise.   HPI HPI: Patient is a 83 year old female who presented with fatigue on 2/18. Patient was noted to have been diagnosed with influenza and UTI prior to admission.patient was admitted with community acquired bacterial pneumonia, acute encephalopathy, severe sepsis secondary to UTI and AKI.   PMH: bipolar, dementia, DM, GERD, IBS, HTN, COPD.Marland Kitchen CT head without contrast with no acute findings.  CXR 2/18 without acute findings. Etiology of her encephalopathy likely multifactorial with severe sepsis, lactic acidosis, ESBL E. coli UTI, adult failure to thrive with poor oral intake, acute renal failure.      SLP Plan  Discharge SLP treatment due to education complete      Recommendations for follow up therapy are one component of a multi-disciplinary discharge planning process, led by the attending physician.  Recommendations may be updated based on patient status, additional functional criteria and  insurance authorization.    Recommendations  Diet recommendations: Dysphagia 2 (fine chop);Thin liquid Liquids provided via: Straw;Cup Medication Administration: Whole meds with liquid Supervision: Full supervision/cueing for compensatory strategies;Trained caregiver to feed patient Compensations: Slow rate;Small sips/bites;Minimize environmental distractions Postural Changes and/or Swallow Maneuvers: Seated upright 90 degrees;Upright 30-60 min after meal                  Oral care BID   Frequent or constant Supervision/Assistance Dysphagia, unspecified (R13.10)     Discharge SLP treatment due to education complete    Ellice Boultinghouse B. Murvin Natal, [facility], CCC-SLP Speech Language Pathologist  [name] 07/18/2023, 1:26 PM

## 2023-07-18 NOTE — Plan of Care (Signed)

## 2023-07-18 NOTE — TOC Progression Note (Addendum)
 Transition of Care [facility]) - Progression Note   Patient Details  Name: Cindy Padilla MRN: [id number] Date of Birth: Feb 26, 1941  [facility]) CM/SW Contact  Ewing Schlein, LCSW Phone Number: 07/18/2023, 12:33 PM  Clinical Narrative: Patient received the following bed offers:  [facility] [address] [place], [place] [address] [phone] Overall rating ? Much below average  [facility] [address] [place], [place] [address] [phone] Overall rating ??? Average  [facility] [address] [place], [place] [address] [phone] Overall rating?? Below average  [facility] [address] [place], [place] [address] [phone] Overall rating ?? Below average  [facility] [address] [place], [place] [address] [phone] Overall rating ???? Above average  CSW emailed bed offers to daughter to review.  Addendum: Daughter chose [facility]. CSW contacting Bessie in admissions to confirm bed. Patient is not yet medically stable for SNF.  Addendum #2: Lowella Bandy with [facility] will have an available bed on 2/24.  Expected Discharge Plan: Skilled Nursing Facility Barriers to Discharge: Continued Medical Work up, SNF Pending bed offer  Expected Discharge Plan and Services In-house Referral: Clinical Social Work Post Acute Care Choice: Skilled Nursing Facility Living arrangements for the past 2 months:  (Memory Care at [facility])            DME Arranged: N/A DME Agency: NA  Social Determinants of Health (SDOH) Interventions SDOH Screenings   Food Insecurity: No Food Insecurity (05/24/2023)  Housing: Low Risk  (05/24/2023)  Transportation Needs: No Transportation Needs (05/24/2023)  Utilities: Not At Risk (05/24/2023)  Social Connections: Moderately Integrated (05/27/2023)  Tobacco Use: Medium Risk  (07/17/2023)   Readmission Risk Interventions    07/17/2023    2:56 PM  Readmission Risk Prevention Plan  Transportation Screening Complete  PCP or Specialist Appt within 3-5 Days Complete  HRI or Home Care Consult Complete  Social Work Consult for Recovery Care Planning/Counseling Complete

## 2023-07-18 NOTE — Progress Notes (Addendum)
 Physical Therapy Treatment Patient Details Name: Cindy Padilla MRN: [id number] DOB: 1940-06-12 Today's Date: 07/18/2023   History of Present Illness Patient is a 83 year old female who presented from memory care unit  with weakness, CAP on 07/15/23. Patient was noted to have been diagnosed with influenza and UTI 07/04/23 .patient was admitted with acute encephalopathy, severe sepsis secondary to UTI and AKI.   PMH: bipolar, dementia, DM, GERD, IBS, HTN, COPD.    PT Comments  Noted potassium 2.4, nurse stated pt has several had runs of potassium. Nurse stated pt has had decreased p.o. intake today, and decreased urine output. Pt lethargic in bed, would not open eyes to verbal/tactile stimuli.  Raised head of bed and pt became more alert, she opened her eyes and was verbal but only oriented to self. Attempted supine to sit with +2 assistance but pt c/o severe pain in LLE with minimal movement. Performed gentle AAROM BUEs and to RLE. Pain and confusion limiting progression with mobility. Bed left in chair position at end of session.    If plan is discharge home, recommend the following: Two people to help with walking and/or transfers;Assistance with cooking/housework;Two people to help with bathing/dressing/bathroom;Direct supervision/assist for medications management;Assist for transportation;Help with stairs or ramp for entrance;Direct supervision/assist for financial management   Can travel by private vehicle     No  Equipment Recommendations  None recommended by PT    Recommendations for Other Services       Precautions / Restrictions Precautions Precautions: Fall Recall of Precautions/Restrictions: Impaired Restrictions Weight Bearing Restrictions Per Provider Order: No Other Position/Activity Restrictions: wound on LLE     Mobility  Bed Mobility               General bed mobility comments: attempted supine to sit but pt c/o severe pain with minimal movement of LLE     Transfers                        Ambulation/Gait                   Stairs             Wheelchair Mobility     Tilt Bed    Modified Rankin (Stroke Patients Only)       Balance                                            Communication    Cognition Arousal: Lethargic Behavior During Therapy: Flat affect   PT - Cognitive impairments: No family/caregiver present to determine baseline, Memory, Orientation, Awareness, Problem solving, Attention, Initiation   Orientation impairments: Place, Time, Situation                   PT - Cognition Comments: oriented to self only, initially very lethargic and would not open eyes nor respond to commands/questions, with head of bed up pt opened eyes and became verbal, able to state her birthdate but just repeated "I love you, you're precious" over and over, not responsive to quesions/commands. Following commands: Impaired Following commands impaired: Follows one step commands with increased time    Cueing Cueing Techniques: Verbal cues, Gestural cues, Tactile cues  Exercises General Exercises - Lower Extremity Ankle Circles/Pumps: AAROM, Both, 10 reps, Supine Heel Slides: AAROM, Right, 10 reps, Limitations Heel Slides  Limitations: LLE painful with attempted movement Hip ABduction/ADduction: AAROM, Right, 10 reps, Supine, Limitations Hip Abduction/Adduction Limitations: LLE painful with attempted movement Shoulder flexion AAROM; both, 10 reps, supine   General Comments        Pertinent Vitals/Pain Pain Assessment Faces Pain Scale: Hurts even more Breathing: occasional labored breathing, short period of hyperventilation Negative Vocalization: occasional moan/groan, low speech, negative/disapproving quality Facial Expression: sad, frightened, frown Body Language: tense, distressed pacing, fidgeting Consolability: distracted or reassured by voice/touch PAINAD Score: 5 Pain  Location: LLE with attempted movement Pain Descriptors / Indicators: Grimacing, Guarding, Moaning Pain Intervention(s): Limited activity within patient's tolerance, Monitored during session, Repositioned    Home Living                          Prior Function            PT Goals (current goals can now be found in the care plan section) Acute Rehab PT Goals PT Goal Formulation: Patient unable to participate in goal setting Time For Goal Achievement: 07/31/23 Potential to Achieve Goals: Good Progress towards PT goals: Progressing toward goals    Frequency    Min 1X/week      PT Plan      Co-evaluation              AM-PAC PT "6 Clicks" Mobility   Outcome Measure  Help needed turning from your back to your side while in a flat bed without using bedrails?: Total Help needed moving from lying on your back to sitting on the side of a flat bed without using bedrails?: Total Help needed moving to and from a bed to a chair (including a wheelchair)?: Total Help needed standing up from a chair using your arms (e.g., wheelchair or bedside chair)?: Total Help needed to walk in hospital room?: Total Help needed climbing 3-5 steps with a railing? : Total 6 Click Score: 6    End of Session   Activity Tolerance: Patient limited by fatigue Patient left: in bed;with call bell/phone within reach;with bed alarm set Nurse Communication: Mobility status;Need for lift equipment PT Visit Diagnosis: Difficulty in walking, not elsewhere classified (R26.2);History of falling (Z91.81)     Time: 6578-4696 PT Time Calculation (min) (ACUTE ONLY): 19 min  Charges:    $Therapeutic Activity: 8-22 mins PT General Charges $$ ACUTE PT VISIT: 1 Visit                     Tamala Ser PT 07/18/2023  Acute Rehabilitation Services  Office [phone]

## 2023-07-19 DIAGNOSIS — J159 Unspecified bacterial pneumonia: Secondary | ICD-10-CM | POA: Diagnosis not present

## 2023-07-19 LAB — RENAL FUNCTION PANEL
Albumin: 1.6 g/dL — ABNORMAL LOW (ref 3.5–5.0)
Anion gap: 9 (ref 5–15)
BUN: 11 mg/dL (ref 8–23)
CO2: 22 mmol/L (ref 22–32)
Calcium: 7.6 mg/dL — ABNORMAL LOW (ref 8.9–10.3)
Chloride: 108 mmol/L (ref 98–111)
Creatinine, Ser: 0.57 mg/dL (ref 0.44–1.00)
GFR, Estimated: >60 mL/min (ref >60)
Glucose, Bld: 79 mg/dL (ref 70–99)
Phosphorus: 2.8 mg/dL (ref 2.5–4.6)
Potassium: 4 mmol/L (ref 3.5–5.1)
Sodium: 139 mmol/L (ref 135–145)

## 2023-07-19 LAB — HEMOGLOBIN AND HEMATOCRIT, BLOOD
HCT: 24.9 % — ABNORMAL LOW (ref 36.0–46.0)
HCT: 25.4 % — ABNORMAL LOW (ref 36.0–46.0)
Hemoglobin: 7.3 g/dL — ABNORMAL LOW (ref 12.0–15.0)
Hemoglobin: 7.7 g/dL — ABNORMAL LOW (ref 12.0–15.0)

## 2023-07-19 LAB — MAGNESIUM: Magnesium: 2 mg/dL (ref 1.7–2.4)

## 2023-07-19 LAB — GLUCOSE, CAPILLARY
Glucose-Capillary: 82 mg/dL (ref 70–99)
Glucose-Capillary: 103 mg/dL — ABNORMAL HIGH (ref 70–99)

## 2023-07-19 LAB — OCCULT BLOOD X 1 CARD TO LAB, STOOL: Fecal Occult Bld: POSITIVE — AB

## 2023-07-19 MED ORDER — PANTOPRAZOLE SODIUM 40 MG IV SOLR
40.0000 mg | Freq: Two times a day (BID) | INTRAVENOUS | Status: DC
Start: 2023-07-19 — End: 2023-07-25
  Administered 2023-07-19 – 2023-07-24 (×12): 40 mg via INTRAVENOUS
  Filled 2023-07-19 (×13): qty 10

## 2023-07-19 MED ORDER — SODIUM CHLORIDE 0.9 % IV SOLN
2.0000 g | INTRAVENOUS | Status: DC
  Administered 2023-07-20 – 2023-07-21 (×2): 2 g via INTRAVENOUS
  Filled 2023-07-19 (×2): qty 20

## 2023-07-19 NOTE — Progress Notes (Addendum)
 Pharmacy Antibiotic Note  Cindy Padilla is a 83 y.o. female admitted on 07/15/2023 with  fatigue .  PMH significant for recent ED visit on 2/7 at which she tested positive for influenza A. Pt was also diagnosed with UTI and prescribed cephalexin. UCx resulted as ESBL+ E.coli and antibiotics were changed to fosfomycin 3 g PO once. Influenza again + on admission.  Today, 07/19/23 WBC WNL Scr WNL. CrCl ~45 mL/min Intermittent fevers (temp high 38.3 C)  Plan: Continue Ertapenem 1 g IV q24h (narrow abx when able, when afebrile) Continue vancomycin to 750 mg IV q24h ( est AUC 554) Goal AUC 400-550. Check levels as needed Monitor renal function, culture data for ability to de-escalate antibiotics.   Height: 5' (152.4 cm) Weight: 63.3 kg (139 lb 8.8 oz) IBW/kg (Calculated) : 45.5  Temp (24hrs), Avg:98.7 F (37.1 C), Min:97.5 F (36.4 C), Max:100.9 F (38.3 C)  Recent Labs  Lab 07/15/23 1523 07/15/23 1535 07/15/23 1710 07/15/23 1719 07/15/23 2328 07/16/23 0248 07/16/23 0733 07/17/23 0642 07/18/23 0331 07/18/23 1537 07/19/23 0343  WBC 11.1*  --   --   --   --   --  8.3  --  5.8  --   --   CREATININE 1.60*  --    < >  --   --  0.98 0.79 0.46 0.39* 0.54 0.57  LATICACIDVEN  --  2.7*  --  3.1* >9.0* 3.0*  --   --  1.5  --   --    < > = values in this interval not displayed.    Estimated Creatinine Clearance: 44.2 mL/min (by C-G formula based on SCr of 0.57 mg/dL).    Allergies  Allergen Reactions   Sulfa Antibiotics Other (See Comments)    Unknown reaction  Listed as an allergy on MAR  Other Reaction(s): Other (See Comments), Unknown    Unknown reaction Listed as an allergy on MAR   Iodine Other (See Comments)    Unknown reaction Listed as an allergy on MAR   Ms Contin [Morphine] Other (See Comments)    Unknown reaction Listed as an allergy on MAR   Aminoglycosides Other (See Comments)    Unknown reaction  Listed as an allergy on MAR   Aspirin Other (See  Comments)    Unknown reaction  Listed as an allergy on MAR   Codeine Other (See Comments)    Unknown reaction Listed as an allergy on MAR   Ivp Dye [Iodinated Contrast Media] Other (See Comments)    Unknown reaction Listed as an allergy on MAR   Neomycin Other (See Comments)    Unknown reaction Listed as an allergy on MAR   Penicillins Rash and Other (See Comments)    Not listed on MAR  Has patient had a PCN reaction causing immediate rash, facial/tongue/throat swelling, SOB or lightheadedness with hypotension: No Has patient had a PCN reaction causing severe rash involving mucus membranes or skin necrosis: No Has patient had a PCN reaction that required hospitalization No Has patient had a PCN reaction occurring within the last 10 years: No If all of the above answers are "NO", then may proceed with Cephalosporin use.   Phenothiazines Other (See Comments)    Unknown reaction Listed as an allergy on MAR   Promethazine Other (See Comments)    Unknown reaction Not listed on MAR   Tetracyclines & Related Other (See Comments)    Unknown reaction Listed as an allergy on MAR    Antimicrobials this  admission: ertapenem 2/18 >>  vancomycin 2/19 >>   Dose adjustments this admission:  Microbiology results: 2/18 BCx: NGTD 2/18 UCx:  NGTD Influenza A+  Landis Gandy, PharmD 07/19/2023 11:26 AM
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
## 2023-07-19 NOTE — Progress Notes (Signed)
 MEWS Progress Note  Patient Details Name: Cindy Padilla MRN: [id number] DOB: 12/28/1940 Today's Date: 07/19/2023   MEWS Flowsheet Documentation:  Assess: MEWS Score Temp: (!) 100.9 F (38.3 C) BP: (!) 98/42 MAP (mmHg): (!) 58 Pulse Rate: 88 ECG Heart Rate: 97 Resp: 19 Level of Consciousness: Alert SpO2: 92 % O2 Device: Room Air O2 Flow Rate (L/min): 2 L/min Assess: MEWS Score MEWS Temp: 1 MEWS Systolic: 1 MEWS Pulse: 0 MEWS RR: 0 MEWS LOC: 0 MEWS Score: 2 MEWS Score Color: Yellow Assess: SIRS CRITERIA SIRS Temperature : 0 SIRS Respirations : 0 SIRS Pulse: 0 SIRS WBC: 0 SIRS Score Sum : 0 Assess: if the MEWS score is Yellow or Red Were vital signs accurate and taken at a resting state?: Yes Does the patient meet 2 or more of the SIRS criteria?: No MEWS guidelines implemented : Yes, yellow Treat MEWS Interventions: Considered administering scheduled or prn medications/treatments as ordered Take Vital Signs Increase Vital Sign Frequency : Yellow: Q2hr x1, continue Q4hrs until patient remains green for 12hrs Escalate MEWS: Escalate: Yellow: Discuss with charge nurse and consider notifying provider and/or RRT Notify: Charge Nurse/RN Name of Charge Nurse/RN Notified: Salli Quarry, RN (this is my patient) Provider Notification Provider Name/Title: Anthoney Harada, NP Date Provider Notified: 07/19/23 Time Provider Notified: [phone] Method of Notification: Page (secure chat sent to notify) Notification Reason: Other (Comment) (mews protocol) Provider response: Other (Comment) (will await any new orders- tylenol being given in the meantime) Notify: Rapid Response Name of Rapid Response RN Notified: not necessary at this time      Kizzie Bane 07/19/2023, 6:09 AM

## 2023-07-19 NOTE — Plan of Care (Signed)
 ?  Problem: Coping: ?Goal: Level of anxiety will decrease ?Outcome: Progressing ?  ?Problem: Safety: ?Goal: Ability to remain free from injury will improve ?Outcome: Progressing ?  ?

## 2023-07-19 NOTE — Plan of Care (Signed)
  Problem: Clinical Measurements: Goal: Will remain free from infection Outcome: Progressing   Problem: Activity: Goal: Risk for activity intolerance will decrease Outcome: Progressing   Problem: Coping: Goal: Level of anxiety will decrease Outcome: Progressing

## 2023-07-20 DIAGNOSIS — K449 Diaphragmatic hernia without obstruction or gangrene: Secondary | ICD-10-CM

## 2023-07-20 DIAGNOSIS — K922 Gastrointestinal hemorrhage, unspecified: Secondary | ICD-10-CM

## 2023-07-20 DIAGNOSIS — J159 Unspecified bacterial pneumonia: Secondary | ICD-10-CM | POA: Diagnosis not present

## 2023-07-20 DIAGNOSIS — Z7902 Long term (current) use of antithrombotics/antiplatelets: Secondary | ICD-10-CM | POA: Diagnosis not present

## 2023-07-20 LAB — GLUCOSE, CAPILLARY
Glucose-Capillary: 73 mg/dL (ref 70–99)
Glucose-Capillary: 80 mg/dL (ref 70–99)
Glucose-Capillary: 83 mg/dL (ref 70–99)
Glucose-Capillary: 83 mg/dL (ref 70–99)
Glucose-Capillary: 86 mg/dL (ref 70–99)
Glucose-Capillary: 88 mg/dL (ref 70–99)
Glucose-Capillary: 96 mg/dL (ref 70–99)

## 2023-07-20 LAB — HEMOGLOBIN AND HEMATOCRIT, BLOOD
HCT: 23.9 % — ABNORMAL LOW (ref 36.0–46.0)
HCT: 26.5 % — ABNORMAL LOW (ref 36.0–46.0)
Hemoglobin: 7.2 g/dL — ABNORMAL LOW (ref 12.0–15.0)
Hemoglobin: 7.9 g/dL — ABNORMAL LOW (ref 12.0–15.0)

## 2023-07-20 LAB — RENAL FUNCTION PANEL
Albumin: 1.5 g/dL — ABNORMAL LOW (ref 3.5–5.0)
Anion gap: 4 — ABNORMAL LOW (ref 5–15)
BUN: 11 mg/dL (ref 8–23)
CO2: 25 mmol/L (ref 22–32)
Calcium: 7.7 mg/dL — ABNORMAL LOW (ref 8.9–10.3)
Chloride: 113 mmol/L — ABNORMAL HIGH (ref 98–111)
Creatinine, Ser: 0.64 mg/dL (ref 0.44–1.00)
GFR, Estimated: >60 mL/min (ref >60)
Glucose, Bld: 89 mg/dL (ref 70–99)
Phosphorus: 2.5 mg/dL (ref 2.5–4.6)
Potassium: 3.8 mmol/L (ref 3.5–5.1)
Sodium: 142 mmol/L (ref 135–145)

## 2023-07-20 LAB — MAGNESIUM: Magnesium: 2.1 mg/dL (ref 1.7–2.4)

## 2023-07-20 MED ORDER — SUCRALFATE 1 GM/10ML PO SUSP
1.0000 g | Freq: Four times a day (QID) | ORAL | Status: DC
  Administered 2023-07-20 – 2023-07-24 (×7): 1 g via ORAL
  Filled 2023-07-20 (×12): qty 10

## 2023-07-20 NOTE — Consult Note (Addendum)
 Consultation  Referring Provider:      Primary Care Physician:  Valere Dross, FNP Primary Gastroenterologist:  Dr Adela Lank       Reason for Consultation:     GI bleed         HPI:   Cindy Padilla is a 83 y.o. female  [facility] (previously used walker, now wheelchair-bound since 07/04/2023) With dementia, HTN, HLD, CAD on Plavix,  COPD, critical aortic stenosis, large HH, cellulitis  Adm with influenza A/UTI/cellulitis/acute metabolic encephalopathy/sepsis and failure to thrive. On Vanco/Rocephin (had 5 days of Invanz) Currently in resp/droplet isolation  GI consulted for dark stools 2/22 while on Plavix/Lovenox Hb dropped from 11 to 7.8 Plavix and Lovenox has been held (last dose 2/21) Hb has been stable currently. She has been on iron at nursing home but was mostly constipated.  No abdominal pain.  No nausea/vomiting.  No nonsteroidals.   Previous GI workup: Last EGD 2021-LA grade C esophagitis, large hiatal hernia with Sheria Lang erosions.  Last colonoscopy 2009-by Dr. Carla Drape: Negative except for hemorrhoids.  Also had flexible sigmoidoscopy in January 2016 which showed hemorrhoids but was otherwise normal.      Latest Ref Rng & Units 07/20/2023    7:21 AM 07/20/2023    1:43 AM 07/19/2023    7:01 PM  CBC  Hemoglobin 12.0 - 15.0 g/dL 7.9  7.2  7.7   Hematocrit 36.0 - 46.0 % 26.5  23.9  25.4       Past Medical History:  Diagnosis Date   Arthritis    Bipolar 1 disorder (HCC)    Bronchitis    Chronic back pain    Constipation    COPD (chronic obstructive pulmonary disease) (HCC)    Dementia (HCC)    Diabetes mellitus    GERD (gastroesophageal reflux disease)    Hiatal hernia    Hypertension    IBS (irritable bowel syndrome)     Past Surgical History:  Procedure Laterality Date   ESOPHAGOGASTRODUODENOSCOPY (EGD) WITH PROPOFOL N/A 04/14/2020   Procedure: ESOPHAGOGASTRODUODENOSCOPY (EGD) WITH PROPOFOL;  Surgeon: Napoleon Form, MD;   Location: [facility];  Service: Endoscopy;  Laterality: N/A;   gallbladder     MANDIBLE SURGERY     PARTIAL HYSTERECTOMY      Family History  Problem Relation Age of Onset   Emphysema Mother    Cancer Other      Social History   Tobacco Use   Smoking status: Never   Smokeless tobacco: Former    Types: Snuff    Quit date: 05/28/1959   Tobacco comments:    Pt used snuff for 33 years  Vaping Use   Vaping status: Never Used  Substance Use Topics   Alcohol use: No   Drug use: No    Prior to Admission medications   Medication Sig Start Date End Date Taking? Authorizing Provider  acetaminophen (TYLENOL) 325 MG tablet Take 650 mg by mouth 3 (three) times daily.   Yes [provider]  albuterol (VENTOLIN HFA) 108 (90 Base) MCG/ACT inhaler Inhale 2 puffs into the lungs every 4 (four) hours as needed for wheezing or shortness of breath. 05/12/23  Yes [provider]  amLODipine (NORVASC) 5 MG tablet Take 5 mg by mouth daily.   Yes [provider]  antiseptic oral rinse (BIOTENE) LIQD 10 mLs by Mouth Rinse route at bedtime.   Yes [provider]  atorvastatin (LIPITOR) 20 MG tablet Take 20 mg by mouth  every evening.   Yes [provider]  benzonatate (TESSALON) 100 MG capsule Take 1 capsule (100 mg total) by mouth 3 (three) times daily as needed for cough. 05/28/23  Yes Regalado, Belkys A, MD  BREO ELLIPTA 200-25 MCG/ACT AEPB Inhale 1 puff into the lungs daily. 04/21/23  Yes [provider]  Cholecalciferol (VITAMIN D) 50 MCG (2000 UT) tablet Take 2,000 Units by mouth daily.    Yes [provider]  clopidogrel (PLAVIX) 75 MG tablet Take 1 tablet (75 mg total) by mouth daily. 04/20/20  Yes Almon Hercules, MD  cyanocobalamin (VITAMIN B12) 500 MCG tablet Take 1 tablet (500 mcg total) by mouth daily. 05/28/23  Yes Regalado, Belkys A, MD  diclofenac Sodium (VOLTAREN) 1 % GEL Apply 2 g topically 4 (four) times daily as needed (Pain).    Yes [provider]  donepezil (ARICEPT) 10 MG tablet Take 10 mg by mouth at bedtime.   Yes [provider]  ferrous sulfate 325 (65 FE) MG tablet Take 1 tablet (325 mg total) by mouth 2 (two) times daily with a meal. Patient taking differently: Take 325 mg by mouth daily. 04/17/20  Yes Almon Hercules, MD  fluticasone (FLONASE) 50 MCG/ACT nasal spray Place 2 sprays into both nostrils daily.    Yes [provider]  guaiFENesin (MUCINEX) 600 MG 12 hr tablet Take 2 tablets (1,200 mg total) by mouth 2 (two) times daily. 05/28/23  Yes Regalado, Belkys A, MD  hydrocortisone 2.5 % cream Apply 1 Application topically daily as needed (Irritation). 05/12/23  Yes [provider]  INCRUSE ELLIPTA 62.5 MCG/INH AEPB Inhale 1 puff into the lungs daily.  04/17/19  Yes [provider]  ipratropium-albuterol (DUONEB) 0.5-2.5 (3) MG/3ML SOLN Take 3 mLs by nebulization 3 (three) times daily. 05/28/23  Yes Regalado, Belkys A, MD  lamoTRIgine (LAMICTAL) 200 MG tablet Take 200 mg by mouth 2 (two) times daily.   Yes [provider]  LORazepam (ATIVAN) 0.5 MG tablet Take 0.5 mg by mouth 2 (two) times daily. 06/28/23  Yes [provider]  magnesium oxide (MAG-OX) 400 (241.3 Mg) MG tablet Take 400 mg by mouth daily.   Yes [provider]  montelukast (SINGULAIR) 10 MG tablet Take 10 mg by mouth at bedtime.   Yes [provider]  [facility] powder Apply 1 Application topically 2 (two) times daily as needed (Rash). 05/12/23  Yes [provider]  polyethylene glycol (MIRALAX / GLYCOLAX) 17 g packet Take 17 g by mouth 2 (two) times daily as needed for moderate constipation.   Yes [provider]  potassium chloride SA (KLOR-CON M) 20 MEQ tablet Take 20 mEq by mouth daily. 04/21/23  Yes [provider]  QUEtiapine (SEROQUEL) 50 MG tablet Take 50-100 mg by mouth See admin instructions. Take 50mg  (1 tablet) by mouth every morning and  100mg  (2 tablets) every evening.   Yes [provider]  senna-docusate (SENOKOT-S) 8.6-50 MG per tablet Take 1 tablet by mouth 2 (two) times daily.   Yes [provider]  torsemide (DEMADEX) 20 MG tablet Take 3 tablets (60 mg total) by mouth daily. Patient taking differently: Take 80 mg by mouth daily. 10/16/19  Yes Elgergawy, Leana Roe, MD  cephALEXin (KEFLEX) 500 MG capsule Take 1 capsule (500 mg total) by mouth 2 (two) times daily. Patient not taking: Reported on 07/15/2023 07/04/23   Rolan Bucco, MD  oseltamivir (TAMIFLU) 75 MG capsule Take 75 mg by mouth daily. Patient not  taking: Reported on 07/15/2023 07/01/23   [provider]    Current Facility-Administered Medications  Medication Dose Route Frequency Provider Last Rate Last Admin   acetaminophen (TYLENOL) tablet 650 mg  650 mg Oral Q6H PRN Alan Mulder, MD   650 mg at 07/19/23 2139   Or   acetaminophen (TYLENOL) suppository 650 mg  650 mg Rectal Q6H PRN Alan Mulder, MD       amLODipine (NORVASC) tablet 5 mg  5 mg Oral Daily Dorrell, Molly Maduro, MD   5 mg at 07/20/23 9604   atorvastatin (LIPITOR) tablet 20 mg  20 mg Oral QPM Alan Mulder, MD   20 mg at 07/19/23 1828   cefTRIAXone (ROCEPHIN) 2 g in sodium chloride 0.9 % 100 mL IVPB  2 g Intravenous Q24H Berton Mount I, MD       Chlorhexidine Gluconate Cloth 2 % PADS 6 each  6 each Topical Daily Dorrell, Robert, MD   6 each at 07/19/23 1100   donepezil (ARICEPT) tablet 10 mg  10 mg Oral QHS Alan Mulder, MD   10 mg at 07/19/23 2139   fluticasone furoate-vilanterol (BREO ELLIPTA) 200-25 MCG/ACT 1 puff  1 puff Inhalation Daily [place], Alvira Philips, DO   1 puff at 07/20/23 5409   lamoTRIgine (LAMICTAL) tablet 200 mg  200 mg Oral BID Alan Mulder, MD   200 mg at 07/20/23 8119   LORazepam (ATIVAN) tablet 0.5 mg  0.5 mg Oral BID Alan Mulder, MD   0.5 mg at 07/20/23 1478   montelukast (SINGULAIR) tablet 10 mg  10 mg Oral QHS [place], Alvira Philips, DO   10 mg at  07/19/23 2139   ondansetron (ZOFRAN) tablet 4 mg  4 mg Oral Q6H PRN Alan Mulder, MD       Or   ondansetron (ZOFRAN) injection 4 mg  4 mg Intravenous Q6H PRN Alan Mulder, MD       oxyCODONE (Oxy IR/ROXICODONE) immediate release tablet 5 mg  5 mg Oral Q4H PRN Alan Mulder, MD   5 mg at 07/15/23 2213   pantoprazole (PROTONIX) injection 40 mg  40 mg Intravenous Q12H Berton Mount I, MD   40 mg at 07/20/23 2956   QUEtiapine (SEROQUEL) tablet 100 mg  100 mg Oral Florence Canner, MD   100 mg at 07/19/23 2139   QUEtiapine (SEROQUEL) tablet 50 mg  50 mg Oral Daily Alan Mulder, MD   50 mg at 07/20/23 2130   umeclidinium bromide (INCRUSE ELLIPTA) 62.5 MCG/ACT 1 puff  1 puff Inhalation Daily [place], Alvira Philips, DO   1 puff at 07/20/23 8657   vancomycin (VANCOCIN) 750 mg in sodium chloride 0.9 % 250 mL IVPB  750 mg Intravenous Q24H Pricilla Riffle, RPH 250 mL/hr at 07/20/23 0946 750 mg at 07/20/23 0946    Allergies as of 07/15/2023 - Review Complete 07/15/2023  Allergen Reaction Noted   Sulfa antibiotics Other (See Comments) 12/25/2010   Iodine Other (See Comments)    Ms contin [morphine] Other (See Comments)    Aminoglycosides Other (See Comments) 05/06/2014   Aspirin Other (See Comments) 01/07/2011   Codeine Other (See Comments) 01/07/2011   Ivp dye [iodinated contrast media] Other (See Comments) 01/07/2011   Neomycin Other (See Comments) 01/07/2011   Penicillins Rash and Other (See Comments) 12/25/2010   Phenothiazines Other (See Comments) 05/06/2014   Promethazine Other (See Comments) 12/25/2010   Tetracyclines & related Other (See Comments) 12/25/2010     Review of Systems:    As  per HPI, otherwise negative    Physical Exam:  Vital signs in last 24 hours: Temp:  [98.2 F (36.8 C)-98.7 F (37.1 C)] 98.7 F (37.1 C) (02/23 0409) Pulse Rate:  [72-86] 80 (02/23 0409) Resp:  [16-17] 17 (02/23 0409) BP: (107-137)/(48-69) 137/65 (02/23 0409) SpO2:  [94 %-100 %] 94 %  (02/23 0833) [name] : 07/19/23 Gen: awake, alert, NAD.  Not oriented. HEENT: anicteric, no pallor CV: RRR, no mrg Pulm: CTA b/l Abd: soft, NT/ND, +BS throughout Ext: no c/c/e Neuro: nonfocal   LAB RESULTS: Recent Labs    07/18/23 0331 07/19/23 1330 07/19/23 1901 07/20/23 0143 07/20/23 0721  WBC 5.8  --   --   --   --   HGB 7.8*   < > 7.7* 7.2* 7.9*  HCT 26.0*   < > 25.4* 23.9* 26.5*  PLT 294  --   --   --   --    < > = values in this interval not displayed.   BMET Recent Labs    07/18/23 1537 07/19/23 0343 07/20/23 0143  NA 140 139 142  K 3.9 4.0 3.8  CL 105 108 113*  CO2 26 22 25   GLUCOSE 106* 79 89  BUN 10 11 11   CREATININE 0.54 0.57 0.64  CALCIUM 8.0* 7.6* 7.7*   LFT Recent Labs    07/20/23 0143  ALBUMIN 1.5*   PT/INR No results for input(s): "LABPROT", "INR" in the last 72 hours.  STUDIES: No results found.     Impression / Plan:   Assessment:   UGI bleed in setting of Plavix/Lovenox (last dose 2/21). Hb 11 (5 days ago) to 7.2. No active bleeding. Known large HH and Cameron erosions Influenza A/urosepsis (on antibiotics, droplet isolation) Multiple comorbidities including dementia, CAD, critical aortic stenosis   Plan: -Agree to hold Plavix/Lovenox for now. -Trend CBC, keep Hb>7 -IV Protonix 40 BID -Add Carafate 1 g elixir QID -Ideally, would like Plavix washout x 5 days, before setting her up for EGD.  Hence elective EGD 2/26. If starts having any active bleed, then will perform expedited EGD. -D/W pt's daughter Darl Pikes. -?Need for long term Plavix or can it be switched to bASA.  I would ask hospitalist svc to discuss with cardiology next week.  Dr. Marina Goodell taking over the service tomorrow.  Edman Circle, MD [facility] [phone]    LOS: 4 days

## 2023-07-20 NOTE — Plan of Care (Signed)
   Problem: Coping: Goal: Level of anxiety will decrease Outcome: Progressing   Problem: Safety: Goal: Ability to remain free from injury will improve Outcome: Progressing   Problem: Pain Managment: Goal: General experience of comfort will improve and/or be controlled Outcome: Progressing

## 2023-07-20 NOTE — Progress Notes (Signed)
 PROGRESS NOTE    Cindy Padilla  ZOX:[id number] DOB: 1940/06/04 DOA: 07/15/2023 PCP: Valere Dross, FNP    Brief Narrative:  Patient is an 83 year old female, with past medical history significant for dementia, HTN, HLD, CAD, COPD, mood disorder who presented to [facility] ED on 2/18 via EMS from [facility] memory care facility with confusion, progressive decline in activity over the preceding week.  Patient has been talking less and with poor oral intake.  Previously walking with a walker but became wheelchair-bound since 07/04/2023.  No reported dyspnea, no chest pain, no fevers, no chills, no wounds that are apparently new.  Previously diagnosed with influenza and UTI on 07/04/2023.  Urine culture from 07/04/2023 with ESBL E. coli, resistant to ceftriaxone.  Patient was admitted with acute metabolic encephalopathy, ESBL E. coli UTI, influenza A, adult failure to thrive.  Antibiotics were changed from ceftriaxone to ertapenem on admission due to resistance from ESBL E. coli.  No blood cultures were performed prior to initiation of antibiotics by EDP or admitting provider.  07/18/2023: Patient seen.  Potassium of 2.4 and phosphorus of 1.7 noted today.  Will replete potassium and phosphorus with IV KCl and IV potassium phosphate.  Will continue to monitor renal function and electrolytes.  Will also start patient on IV fluids.  07/19/2023: Dark stools reported.  Occult blood came back positive.  Hemoglobin has dropped from 11.1-9.4 to 7.8 g/dL.  GI team ([facility]) has been consulted.  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  Plavix and subcutaneous Lovenox will be held.  Will monitor H/H every 6 hours.  Will start patient on IV Protonix 40 Mg twice daily.  07/20/2023: Patient seen.  Also discussed with patient's daughter, Leonides Sake, extensively.  GI input is appreciated.  For EGD on 07/23/2023 if patient remains stable.  Patient was on  Plavix and subcutaneous Lovenox, however, both are on hold.  Assessment & Plan: Acute metabolic encephalopathy: Improving -See above documentation. -Patient presenting with worsening confusion over the last week.  Recently diagnosed with influenza A at her memory care unit.  Also diagnosed with ESBL E. coli UTI which was likely not treated appropriately given resistance profile.  CT head without contrast with no acute findings.  Etiology of her encephalopathy likely multifactorial with severe sepsis, lactic acidosis, ESBL E. coli UTI, adult failure to thrive with poor oral intake, acute renal failure. -Patient has completed antibiotics for UTI secondary to E. coli ESBL. -Patient has left flank wound and bilateral cellulitis. -Patient is currently on IV Rocephin. --Supportive care  Severe sepsis, POA ESBL E. coli UTI Lactic acidosis Patient afebrile, elevated WBC count of 11.1.  Patient confused, elevated lactic acid up to greater than 9.0.  Urinalysis consistent with UTI and previous recent urine culture positive for ESBL E. coli.  Patient was started on ceftriaxone by EDP and changed to ertapenem by admitting hospitalist.  Urine culture was ordered but unfortunately no blood cultures were performed prior to antibiotic administration. -- WBC 11.1>8.3 -- lactic acid 2.7>3.1>9.0>3.0 -- Follow blood culture result.   -- Urine culture: >100K E. coli -- Complete course of ertapenem 1 g IV every 24 hours 07/19/2023: Sepsis physiology has resolved.  Bilateral lower extremity cellulitis Left lower extremity necrotic wound -Left shin wounds from wheelchair incident per family as another residents of the facility ran into her leg a few weeks ago. -- Vancomycin, pharmacy consulted for dosing/monitoring -- Ertapenem 1 g IV every 24 hours  completed (5-day course) -Patient is currently on Rocephin and vancomycin -- Seen by wound RN, Cover wound with single layer of xeroform gauze, top with foam. Change  q3d  07/20/2023: Cellulitis continues to improve.  Continue wound care.  Continue antibiotics.  Acute renal failure: Resolved Creatinine 1.60 on admission, likely secondary to severe dehydration in the setting of poor oral intake in the days preceding hospitalization. -- Cr 1.60>>0.79  -- LR at 75 mL/h -- BMP in the am 07/18/2023: Resolved.  Serum creatinine of 0.54 and estimated GFR of greater than 60 mL/min per 1.73 m documented today.  Dysphagia Dysphagia 2 (Fine chop), thin liquids   Liquid Administration via: Cup;Straw Medication Administration: Whole meds with liquid Supervision: Patient able to self feed;Intermittent supervision to cue for compensatory strategies Compensations: Slow rate;Small sips/bites Postural Changes: Seated upright at 90 degrees   Recent influenza A viral infection Originally diagnosed with influenza A on 07/04/2023; completed course of Tamiflu at facility. -- Droplet precautions  Macrocytic anemia Hemoglobin 9.4, MCV 102.3.  Anemia panel with iron 32, TIBC 161, ferritin 559, folate 7.5, vitamin B12 2733. -- Transfuse for hemoglobin less than 7.0 -- Monitor CBC.  Hyperkalemia: Resolved Potassium 7.3 on admission, likely secondary to acute renal failure.  Treated with Lokelma. -- K 7.3>6.4>4.9>4.5 -- Continue monitor  -Resolved.        Hypokalemia: -Potassium of 2.4 noted today, phosphorus of 1.7. -See above documentation. 07/20/2023: Potassium of 3.8 today.  Hypophosphatemia: -Phosphorus of 1.7. -Replete. -Continue to monitor. 07/20/2023: Phosphorus of 2.5 today.  Acute urinary retention Foley catheter was placed in the ED for acute urinary retention. -- Voiding trial today -- Bladder scan as needed, after next void following Foley catheter removal or if no void in 6 hours  Pleural effusion Severe aortic stenosis Reviewed TTE from December 2024 with preserved LVEF, grade 1 diastolic dysfunction, large pleural effusion and severe aortic  stenosis.  Patient is asymptomatic, no chest pain, no tamponade physiology.  Has outpatient appointment coming up with cardiology, Dr. Odis Hollingshead on 08/11/2023  Essential hypertension -- Amlodipine 5 mg p.o. daily 07/18/2023: Blood pressure is controlled.  Hyperlipidemia -- Atorvastatin 20mg  p.o. daily  CAD -- Continue atorvastatin 20 mg p.o. daily, Plavix 75 mg p.o. daily  COPD:  -- Continue home Breo Ellipta, Incruse Ellipta, Singulair -- Albuterol neb q4h PRN SOB/wheezing  Anemia, likely acute blood loss anemia: -Suspect upper GI bleed. -See above documentation. -Dark-colored stools.  Fecal occult blood positive.  Hemoglobin has dropped from 11 g/dL to 7 g/dL. -GI team has been consulted. -Hold Plavix and subcutaneous Lovenox. -Start patient on IV Protonix 40 Mg twice daily. -Upper GI scope done in 2021 revealed esophagitis and Cameron's lesions.  Hypoalbuminemia: -Albumin of 1.5. -Low albumin may be of prognostic significance. -Have a low threshold to consult palliative care medicine.  Dementia Mood disorder Adult failure to thrive  Weakness/deconditioning/gait disturbance/debility: Patient residing at [facility] memory care facility.  Most recent baseline able to ambulate with the use of a walker but since has been utilizing a wheelchair due to acute decline with recent diagnosis of influenza A on 2/7. -- PT/OT evaluation -- Fall precautions -- TOC consulted for SNF placement as likely will need upgrade from memory care unit -- Supportive care, if no significant improvement over the next few days despite aggressive management as above, may need to consider a more palliative approach given her advanced age, underlying dementia.   DVT prophylaxis: SCDs Start: 07/15/23 2126    Code Status: Limited:  Do not attempt resuscitation (DNR) -DNR-LIMITED -Do Not Intubate/DNI  Family Communication: I called patient's daughter, Leonides Sake, on [phone], but the call went to  voicemail.  Disposition Plan:  Level of care: Med-Surg Status is: Inpatient Remains inpatient appropriate because: IV antibiotics    Consultants:  None  Procedures:  [place] ABI:   Antimicrobials:  Ceftriaxone 2/18-2/18 Vancomycin 2/18>> Ertapenem 2/18>> 07/19/2023 IV Rocephin 07/19/2023>>   Subjective: No history from patient.  Objective: Vitals:   07/20/23 0139 07/20/23 0409 07/20/23 0833 07/20/23 1414  BP: (!) 133/49 137/65  (!) 128/51  Pulse: 72 80    Resp: 16 17  16   Temp: 98.2 F (36.8 C) 98.7 F (37.1 C)  98.1 F (36.7 C)  TempSrc: Oral Oral    SpO2: 97% 100% 94% 99%  Weight:      Height:        Intake/Output Summary (Last 24 hours) at 07/20/2023 1643 Last data filed at 07/20/2023 1416 Gross per 24 hour  Intake 400 ml  Output 405 ml  Net -5 ml   Filed Weights   07/16/23 0000 07/16/23 0330  Weight: 70.3 kg 63.3 kg    Examination:  Physical Exam: GEN: Chronically ill looking.  Not in any distress. HEENT: Patient is pale. CV: S1-S2.   Abdomen: Soft and nontender. Neuro: Left-sided weakness/hemiplegia.  Patient is not particularly compliant. Extremities SCD to lower legs (will discontinue).       Data Reviewed: I have personally reviewed following labs and imaging studies  CBC: Recent Labs  Lab 07/15/23 1523 07/16/23 0733 07/18/23 0331 07/19/23 1330 07/19/23 1901 07/20/23 0143 07/20/23 0721  WBC 11.1* 8.3 5.8  --   --   --   --   NEUTROABS 9.1*  --   --   --   --   --   --   HGB 11.1* 9.4* 7.8* 7.3* 7.7* 7.2* 7.9*  HCT 35.9* 31.4* 26.0* 24.9* 25.4* 23.9* 26.5*  MCV 96.0 102.3* 102.0*  --   --   --   --   PLT 463* 339 294  --   --   --   --    Basic Metabolic Panel: Recent Labs  Lab 07/16/23 0733 07/17/23 0642 07/18/23 0331 07/18/23 1537 07/19/23 0343 07/20/23 0143  NA 135  --  140 140 139 142  K 4.5  --  2.4* 3.9 4.0 3.8  CL 105  --  105 105 108 113*  CO2 21*  --  23 26 22 25   GLUCOSE 70  --  61* 106* 79 89  BUN 30*  --  9 10  11 11   CREATININE 0.79 0.46 0.39* 0.54 0.57 0.64  CALCIUM 7.9*  --  7.9* 8.0* 7.6* 7.7*  MG  --   --  2.1  2.1  --  2.0 2.1  PHOS  --   --  1.7* 3.3 2.8 2.5   GFR: Estimated Creatinine Clearance: 44.2 mL/min (by C-G formula based on SCr of 0.64 mg/dL). Liver Function Tests: Recent Labs  Lab 07/15/23 1523 07/18/23 1537 07/19/23 0343 07/20/23 0143  AST 37  --   --   --   ALT 23  --   --   --   ALKPHOS 165*  --   --   --   BILITOT 1.1  --   --   --   PROT 7.1  --   --   --   ALBUMIN 2.9* 2.1* 1.6* 1.5*   No results for input(s): "  LIPASE", "AMYLASE" in the last 168 hours. No results for input(s): "AMMONIA" in the last 168 hours. Coagulation Profile: No results for input(s): "INR", "PROTIME" in the last 168 hours. Cardiac Enzymes: No results for input(s): "CKTOTAL", "CKMB", "CKMBINDEX", "TROPONINI" in the last 168 hours. BNP (last 3 results) No results for input(s): "PROBNP" in the last 8760 hours. HbA1C: No results for input(s): "HGBA1C" in the last 72 hours. CBG: Recent Labs  Lab 07/20/23 0023 07/20/23 0407 07/20/23 0731 07/20/23 1146 07/20/23 1537  GLUCAP 86 73 83 96 88   Lipid Profile: No results for input(s): "CHOL", "HDL", "LDLCALC", "TRIG", "CHOLHDL", "LDLDIRECT" in the last 72 hours. Thyroid Function Tests: No results for input(s): "TSH", "T4TOTAL", "FREET4", "T3FREE", "THYROIDAB" in the last 72 hours. Anemia Panel: No results for input(s): "VITAMINB12", "FOLATE", "FERRITIN", "TIBC", "IRON", "RETICCTPCT" in the last 72 hours.  Sepsis Labs: Recent Labs  Lab 07/15/23 1719 07/15/23 2328 07/16/23 0248 07/18/23 0331  LATICACIDVEN 3.1* >9.0* 3.0* 1.5    Recent Results (from the past 240 hours)  Resp panel by RT-PCR (RSV, Flu A&B, Covid) Anterior Nasal Swab     Status: Abnormal   Collection Time: 07/15/23  1:55 PM   Specimen: Anterior Nasal Swab  Result Value Ref Range Status   SARS Coronavirus 2 by RT PCR NEGATIVE NEGATIVE Final    Comment:  (NOTE) SARS-CoV-2 target nucleic acids are NOT DETECTED.  The SARS-CoV-2 RNA is generally detectable in upper respiratory specimens during the acute phase of infection. The lowest concentration of SARS-CoV-2 viral copies this assay can detect is 138 copies/mL. A negative result does not preclude SARS-Cov-2 infection and should not be used as the sole basis for treatment or other patient management decisions. A negative result may occur with  improper specimen collection/handling, submission of specimen other than nasopharyngeal swab, presence of viral mutation(s) within the areas targeted by this assay, and inadequate number of viral copies(<138 copies/mL). A negative result must be combined with clinical observations, patient history, and epidemiological information. The expected result is Negative.  Fact Sheet for Patients:  BloggerCourse.com  Fact Sheet for Healthcare Providers:  SeriousBroker.it  This test is no t yet approved or cleared by the [place] FDA and  has been authorized for detection and/or diagnosis of SARS-CoV-2 by FDA under an Emergency Use Authorization (EUA). This EUA will remain  in effect (meaning this test can be used) for the duration of the COVID-19 declaration under Section 564(b)(1) of the Act, 21 U.S.C.section 360bbb-3(b)(1), unless the authorization is terminated  or revoked sooner.       Influenza A by PCR POSITIVE (A) NEGATIVE Final   Influenza B by PCR NEGATIVE NEGATIVE Final    Comment: (NOTE) The Xpert Xpress SARS-CoV-2/FLU/RSV plus assay is intended as an aid in the diagnosis of influenza from Nasopharyngeal swab specimens and should not be used as a sole basis for treatment. Nasal washings and aspirates are unacceptable for Xpert Xpress SARS-CoV-2/FLU/RSV testing.  Fact Sheet for Patients: BloggerCourse.com  Fact Sheet for Healthcare  Providers: SeriousBroker.it  This test is not yet approved or cleared by the [place] FDA and has been authorized for detection and/or diagnosis of SARS-CoV-2 by FDA under an Emergency Use Authorization (EUA). This EUA will remain in effect (meaning this test can be used) for the duration of the COVID-19 declaration under Section 564(b)(1) of the Act, 21 U.S.C. section 360bbb-3(b)(1), unless the authorization is terminated or revoked.     Resp Syncytial Virus by PCR NEGATIVE NEGATIVE Final  Comment: (NOTE) Fact Sheet for Patients: BloggerCourse.com  Fact Sheet for Healthcare Providers: SeriousBroker.it  This test is not yet approved or cleared by the [place] FDA and has been authorized for detection and/or diagnosis of SARS-CoV-2 by FDA under an Emergency Use Authorization (EUA). This EUA will remain in effect (meaning this test can be used) for the duration of the COVID-19 declaration under Section 564(b)(1) of the Act, 21 U.S.C. section 360bbb-3(b)(1), unless the authorization is terminated or revoked.  Performed at [facility], [address], [place], [place] [address]   Urine Culture     Status: Abnormal   Collection Time: 07/15/23  8:02 PM   Specimen: Urine, Catheterized  Result Value Ref Range Status   Specimen Description   Final    URINE, CATHETERIZED Performed at [facility], [address], [place], [place] [address]    Special Requests   Final    NONE Performed at [facility], [address], [place], [place] [address]    Culture >=100,000 COLONIES/mL ESCHERICHIA COLI (A)  Final   Report Status 07/17/2023 FINAL  Final   Organism ID, Bacteria ESCHERICHIA COLI (A)  Final      Susceptibility   Escherichia coli - MIC*    AMPICILLIN >=32 RESISTANT Resistant     CEFAZOLIN >=64 RESISTANT Resistant     CEFEPIME 2  SENSITIVE Sensitive     CEFTRIAXONE 32 RESISTANT Resistant     CIPROFLOXACIN >=4 RESISTANT Resistant     GENTAMICIN <=1 SENSITIVE Sensitive     IMIPENEM <=0.25 SENSITIVE Sensitive     NITROFURANTOIN <=16 SENSITIVE Sensitive     TRIMETH/SULFA >=320 RESISTANT Resistant     AMPICILLIN/SULBACTAM 4 SENSITIVE Sensitive     PIP/TAZO <=4 SENSITIVE Sensitive ug/mL    * >=100,000 COLONIES/mL ESCHERICHIA COLI  MRSA Next Gen by PCR, Nasal     Status: None   Collection Time: 07/16/23  6:26 AM   Specimen: Nasal Mucosa; Nasal Swab  Result Value Ref Range Status   MRSA by PCR Next Gen NOT DETECTED NOT DETECTED Final    Comment: (NOTE) The GeneXpert MRSA Assay (FDA approved for NASAL specimens only), is one component of a comprehensive MRSA colonization surveillance program. It is not intended to diagnose MRSA infection nor to guide or monitor treatment for MRSA infections. Test performance is not FDA approved in patients less than 83 years old. Performed at [facility], [address] [place], [place] [address]   Culture, blood (Routine X 2) w Reflex to ID Panel     Status: None (Preliminary result)   Collection Time: 07/16/23  8:53 AM   Specimen: BLOOD LEFT HAND  Result Value Ref Range Status   Specimen Description   Final    BLOOD LEFT HAND Performed at [facility], [address], [place], [place] [address]    Special Requests   Final    BOTTLES DRAWN AEROBIC ONLY Blood Culture results may not be optimal due to an inadequate volume of blood received in culture bottles Performed at [facility], [address], [place], [place] [address]    Culture   Final    NO GROWTH 4 DAYS Performed at [facility], [address] [place], [place] [address]    Report Status PENDING  Incomplete  Culture, blood (Routine X 2) w Reflex to ID Panel     Status: None (Preliminary result)   Collection Time: 07/16/23  8:53  AM   Specimen: BLOOD LEFT FOREARM   Result Value Ref Range Status   Specimen Description   Final    BLOOD LEFT FOREARM Performed at [facility], [address] [place], [place] [address]    Special Requests   Final    BOTTLES DRAWN AEROBIC ONLY Blood Culture results may not be optimal due to an inadequate volume of blood received in culture bottles Performed at [facility], [address] [place], [place] [address]    Culture   Final    NO GROWTH 4 DAYS Performed at [facility], [address], [place], [place] [address]    Report Status PENDING  Incomplete         Radiology Studies: No results found.       Scheduled Meds:  amLODipine  5 mg Oral Daily   atorvastatin  20 mg Oral QPM   Chlorhexidine Gluconate Cloth  6 each Topical Daily   donepezil  10 mg Oral QHS   fluticasone furoate-vilanterol  1 puff Inhalation Daily   lamoTRIgine  200 mg Oral BID   LORazepam  0.5 mg Oral BID   montelukast  10 mg Oral QHS   pantoprazole (PROTONIX) IV  40 mg Intravenous Q12H   QUEtiapine  100 mg Oral QHS   QUEtiapine  50 mg Oral Daily   sucralfate  1 g Oral Q6H   umeclidinium bromide  1 puff Inhalation Daily   Continuous Infusions:  cefTRIAXone (ROCEPHIN)  IV 2 g (07/20/23 1308)   vancomycin 750 mg (07/20/23 0946)     LOS: 4 days    Time spent: 35 minutes   Barnetta Chapel, MD.   [facility] Available via Epic secure chat 7am-7pm After these hours, please refer to coverage provider listed on [email or web address] 07/20/2023, 4:43 PM

## 2023-07-20 NOTE — Consult Note (Signed)
 WOC consulted for leg wound, seen this admission for same, verified orders are in the EMR for care.   Will not consult for this reason [facility], CNS, CWON-AP [phone]

## 2023-07-21 ENCOUNTER — Encounter (HOSPITAL_COMMUNITY): Payer: Self-pay | Admitting: Internal Medicine

## 2023-07-21 DIAGNOSIS — Z7902 Long term (current) use of antithrombotics/antiplatelets: Secondary | ICD-10-CM | POA: Diagnosis not present

## 2023-07-21 DIAGNOSIS — D62 Acute posthemorrhagic anemia: Secondary | ICD-10-CM

## 2023-07-21 DIAGNOSIS — K921 Melena: Secondary | ICD-10-CM | POA: Diagnosis not present

## 2023-07-21 DIAGNOSIS — J159 Unspecified bacterial pneumonia: Secondary | ICD-10-CM | POA: Diagnosis not present

## 2023-07-21 LAB — CULTURE, BLOOD (ROUTINE X 2)
Culture: NO GROWTH
Culture: NO GROWTH

## 2023-07-21 LAB — GLUCOSE, CAPILLARY
Glucose-Capillary: 77 mg/dL (ref 70–99)
Glucose-Capillary: 75 mg/dL (ref 70–99)
Glucose-Capillary: 102 mg/dL — ABNORMAL HIGH (ref 70–99)
Glucose-Capillary: 111 mg/dL — ABNORMAL HIGH (ref 70–99)
Glucose-Capillary: 118 mg/dL — ABNORMAL HIGH (ref 70–99)

## 2023-07-21 LAB — AMMONIA: Ammonia: 22 umol/L (ref 9–35)

## 2023-07-21 MED ORDER — SENNOSIDES-DOCUSATE SODIUM 8.6-50 MG PO TABS: 1.0000 | ORAL_TABLET | Freq: Every evening | ORAL | Status: DC | PRN

## 2023-07-21 MED ORDER — IPRATROPIUM-ALBUTEROL 0.5-2.5 (3) MG/3ML IN SOLN: 3.0000 mL | RESPIRATORY_TRACT | Status: DC | PRN

## 2023-07-21 MED ORDER — TRAZODONE HCL 50 MG PO TABS: 50.0000 mg | ORAL_TABLET | Freq: Every evening | ORAL | Status: DC | PRN

## 2023-07-21 MED ORDER — DEXTROSE-SODIUM CHLORIDE 5-0.45 % IV SOLN: INTRAVENOUS | Status: AC

## 2023-07-21 MED ORDER — METOPROLOL TARTRATE 5 MG/5ML IV SOLN: 5.0000 mg | INTRAVENOUS | Status: DC | PRN

## 2023-07-21 MED ORDER — LORAZEPAM 0.5 MG PO TABS: 0.5000 mg | ORAL_TABLET | Freq: Two times a day (BID) | ORAL | Status: DC | PRN

## 2023-07-21 MED ORDER — HYDRALAZINE HCL 20 MG/ML IJ SOLN: 10.0000 mg | INTRAMUSCULAR | Status: DC | PRN

## 2023-07-21 NOTE — Progress Notes (Signed)
 Physical Therapy Treatment Patient Details Name: Cindy Padilla MRN: [id number] DOB: 1941-03-10 Today's Date: 07/21/2023   History of Present Illness Patient is a 83 year old female who presented from memory care unit  with weakness, CAP on 07/15/23. Patient was noted to have been diagnosed with influenza and UTI 07/04/23 .patient was admitted with acute encephalopathy, severe sepsis secondary to UTI and AKI.   PMH: bipolar, dementia, DM, GERD, IBS, HTN, COPD.    PT Comments  Pt requiring total assist for supine<>sit, follows commands inconsistently during PT session today. Pt speech unclear at times. Pillows place and heels floated once pt returned to supine and placed cloth  to R side of neck d/t excess moisture noted in that area d/t forward and lateral cervical flexion in supine. D/c plan remains appropriate at this time however pt appears to have limited rehab potential, limited progress made in acute setting. May need to consider other options after SNF trial.   If plan is discharge home, recommend the following: Two people to help with walking and/or transfers;Assistance with cooking/housework;Two people to help with bathing/dressing/bathroom;Direct supervision/assist for medications management;Assist for transportation;Help with stairs or ramp for entrance;Direct supervision/assist for financial management;Supervision due to cognitive status   Can travel by private vehicle     No  Equipment Recommendations  None recommended by PT    Recommendations for Other Services       Precautions / Restrictions Precautions Precautions: Fall Recall of Precautions/Restrictions: Impaired Restrictions Weight Bearing Restrictions Per Provider Order: No Other Position/Activity Restrictions: wound on LLE/cellulitis/painful     Mobility  Bed Mobility Overal bed mobility: Needs Assistance Bed Mobility: Sit to Supine, Supine to Sit     Supine to sit: +2 for physical assistance, Total  assist, +2 for safety/equipment Sit to supine: Total assist, +2 for physical assistance, +2 for safety/equipment   General bed mobility comments: +2 assist to elevate/control descent of trunk and progress LEs off and on to bed . total assist to scoot up in supine with bed in trendelenberg    Transfers                   General transfer comment: NT/unable    Ambulation/Gait               General Gait Details: unable   Stairs             Wheelchair Mobility     Tilt Bed    Modified Rankin (Stroke Patients Only)       Balance Overall balance assessment: Needs assistance, History of Falls Sitting-balance support: No upper extremity supported, Feet supported Sitting balance-Leahy Scale: Poor Sitting balance - Comments: poor to zero; unable to correct posterior lean with multi-modal cues Postural control: Posterior lean                                  Communication Communication Communication: Impaired Factors Affecting Communication: Reduced clarity of speech;Difficulty expressing self  Cognition Arousal: Alert Behavior During Therapy: Flat affect   PT - Cognitive impairments: No family/caregiver present to determine baseline, Memory, Orientation, Awareness, Problem solving, Attention, Initiation                         Following commands: Impaired Following commands impaired: Follows one step commands inconsistently    Cueing Cueing Techniques: Verbal cues, Gestural cues, Tactile cues  Exercises General Exercises -  Lower Extremity Ankle Circles/Pumps: AAROM, Both, 10 reps, Supine, PROM    General Comments        Pertinent Vitals/Pain Pain Assessment Pain Assessment: Faces Faces Pain Scale: Hurts even more Pain Location: LEs with movement, L >R Pain Descriptors / Indicators: Grimacing, Guarding, Moaning Pain Intervention(s): Limited activity within patient's tolerance, Monitored during session, Repositioned     Home Living                          Prior Function            PT Goals (current goals can now be found in the care plan section) Acute Rehab PT Goals PT Goal Formulation: Patient unable to participate in goal setting Time For Goal Achievement: 07/31/23 Potential to Achieve Goals: Good Progress towards PT goals: Progressing toward goals    Frequency    Min 1X/week      PT Plan      Co-evaluation              AM-PAC PT "6 Clicks" Mobility   Outcome Measure  Help needed turning from your back to your side while in a flat bed without using bedrails?: Total Help needed moving from lying on your back to sitting on the side of a flat bed without using bedrails?: Total Help needed moving to and from a bed to a chair (including a wheelchair)?: Total Help needed standing up from a chair using your arms (e.g., wheelchair or bedside chair)?: Total Help needed to walk in hospital room?: Total Help needed climbing 3-5 steps with a railing? : Total 6 Click Score: 6    End of Session   Activity Tolerance: Patient limited by fatigue;Patient limited by pain;Other (comment) (cognition) Patient left: in bed;with call bell/phone within reach;with bed alarm set   PT Visit Diagnosis: Difficulty in walking, not elsewhere classified (R26.2);History of falling (Z91.81)     Time: 2841-3244 PT Time Calculation (min) (ACUTE ONLY): 17 min  Charges:    $Therapeutic Activity: 8-22 mins PT General Charges $$ ACUTE PT VISIT: 1 Visit                     Primitivo Merkey, PT  Acute Rehab Dept [facility]) [phone]  07/21/2023    [facility] 07/21/2023, 1:00 PM

## 2023-07-21 NOTE — Plan of Care (Signed)
   Problem: Coping: Goal: Level of anxiety will decrease Outcome: Progressing   Problem: Pain Managment: Goal: General experience of comfort will improve and/or be controlled Outcome: Progressing   Problem: Safety: Goal: Ability to remain free from injury will improve Outcome: Progressing

## 2023-07-21 NOTE — Progress Notes (Signed)
 PROGRESS NOTE    Cindy Padilla  QMV:[id number] DOB: 09-16-1940 DOA: 07/15/2023 PCP: Valere Dross, FNP    Brief Narrative:   83 year old female, with past medical history significant for dementia, HTN, HLD, CAD, COPD, mood disorder who presented to [facility] ED on 2/18 via EMS from [facility] memory care facility with confusion, progressive decline in activity over the preceding week.  Recently diagnosed with ESBL UTI and influenza.  Now admitted for failure to thrive causing electrolyte imbalance, encephalopathy and Hemoccult stool.  [facility] consulted, planning EGD 2/26.   Assessment & Plan:  Principal Problem:   Community acquired bacterial pneumonia Active Problems:   Sepsis (HCC)    Acute metabolic encephalopathy:  Overall improving.  Likely from multiple underlying ongoing issue including infection from ESBL UTI BS ok, Ammonia is normal.  CT head showed chronic changes. Will order MRI Brain.    Severe sepsis, POA ESBL E. coli UTI Lactic acidosis Sepsis physiology is improving.  Urine cultures from 2/18 reviewed.  Showing ESBL -Completed 5 days of ertapenem 2/18 - 2/22    Anemia, likely acute blood loss anemia Macrocytosis Baseline hemoglobin 11, admission hemoglobin 7.8 with positive Hemoccult stool.  Currently on PPI twice daily and Carafate - EGD 2021 showed esophagitis and Sheria Lang lesion - [facility] following, planning on endoscopy   Bilateral lower extremity cellulitis Left lower extremity necrotic wound -Left shin wounds from wheelchair incident per family as another residents of the facility ran into her leg a few weeks ago. -Due to ongoing concerns of this currently on IV vancomycin and Rocephin -Seen by wound care team   Acute renal failure: Resolved -Resolved -Baseline creatinine 0.6, admission creatinine 1.6   Dysphagia -Continue dysphagia 2 diet   Recent influenza A viral infection Originally diagnosed with influenza A on 07/04/2023;  completed course of Tamiflu at facility.   Hyperkalemia: Resolved        Hypokalemia/hypophosphatemia -As needed repletion   Acute urinary retention Foley catheter was placed in the ED for acute urinary retention. -will attempt foley removal prior to dc.    Pleural effusion Severe aortic stenosis Reviewed TTE from December 2024 with preserved LVEF, grade 1 diastolic dysfunction, large pleural effusion and severe aortic stenosis.  Patient is asymptomatic, no chest pain, no tamponade physiology.  Has outpatient appointment coming up with cardiology, Dr. Odis Hollingshead on 08/11/2023   Essential hypertension -Amlodipine 5 mg p.o. daily. Iv prn   Hyperlipidemia -Atorvastatin 20mg  p.o. daily   CAD Lipitor continue atorvastatin 20 mg p.o. daily -Plavix on hold   COPD:  -Bronchodilators    Dementia Mood disorder Adult failure to thrive   Weakness/deconditioning/gait disturbance/debility: Patient residing at [facility] memory care facility.  Most recent baseline able to ambulate with the use of a walker but since has been utilizing a wheelchair due to acute decline with recent diagnosis of influenza A on 2/7. -PT/OT evaluation     DVT prophylaxis: SCDs Start: 07/15/23 2126    Code Status: Limited: Do not attempt resuscitation (DNR) -DNR-LIMITED -Do Not Intubate/DNI  Family Communication: Called Susan Continue hospital stay for at least next 48 hours  Subjective: When I saw the patient she was mainly nonverbal but attempting to tell me her name.  Nursing staff tells me that her mentation is slightly different compared to the morning.   Examination:  General exam: Appears calm and comfortable, cachectic frail Respiratory system: Clear to auscultation. Respiratory effort normal. Cardiovascular system: S1 & S2 heard, RRR. No JVD, murmurs,  rubs, gallops or clicks. No pedal edema. Gastrointestinal system: Abdomen is nondistended, soft and nontender. No organomegaly or masses felt.  Normal bowel sounds heard. Central nervous system: Alert and oriented. No focal neurological deficits. Extremities: Symmetric 5 x 5 power. Skin: No rashes, lesions or ulcers Psychiatry: Judgement and insight appear poor            Pressure Injury Sacrum Right;Left;Mid Stage 1 -  Intact skin with non-blanchable redness of a localized area usually over a bony prominence. (Active)     Location: Sacrum  Location Orientation: Right;Left;Mid  Staging: Stage 1 -  Intact skin with non-blanchable redness of a localized area usually over a bony prominence.  Wound Description (Comments):   Present on Admission:      Diet Orders (From admission, onward)     Start     Ordered   07/22/23 2359  Diet NPO time specified  Diet effective ____        07/21/23 0949   07/20/23 1247  DIET DYS 2 Room service appropriate? Yes; Fluid consistency: Thin  Diet effective now       Question Answer Comment  Room service appropriate? Yes   Fluid consistency: Thin      07/20/23 1247            Objective: Vitals:   07/20/23 2003 07/21/23 0353 07/21/23 0941 07/21/23 1240  BP: (!) 128/50 (!) 136/57 132/72 (!) 124/57  Pulse: 89 81 89 80  Resp: 14 14 20 18   Temp: 99.1 F (37.3 C) 98.6 F (37 C) 98.5 F (36.9 C) 98 F (36.7 C)  TempSrc: Oral Oral  Axillary  SpO2: 98% 99% 99% 100%  Weight:      Height:        Intake/Output Summary (Last 24 hours) at 07/21/2023 1311 Last data filed at 07/21/2023 0548 Gross per 24 hour  Intake 470 ml  Output 830 ml  Net -360 ml   Filed Weights   07/16/23 0000 07/16/23 0330  Weight: 70.3 kg 63.3 kg    Scheduled Meds:  amLODipine  5 mg Oral Daily   atorvastatin  20 mg Oral QPM   Chlorhexidine Gluconate Cloth  6 each Topical Daily   donepezil  10 mg Oral QHS   fluticasone furoate-vilanterol  1 puff Inhalation Daily   lamoTRIgine  200 mg Oral BID   montelukast  10 mg Oral QHS   pantoprazole (PROTONIX) IV  40 mg Intravenous Q12H   QUEtiapine  100 mg  Oral QHS   QUEtiapine  50 mg Oral Daily   sucralfate  1 g Oral Q6H   umeclidinium bromide  1 puff Inhalation Daily   Continuous Infusions:  cefTRIAXone (ROCEPHIN)  IV 2 g (07/21/23 1212)   vancomycin 750 mg (07/21/23 0958)    Nutritional status     Body mass index is 27.25 kg/m.  Data Reviewed:   CBC: Recent Labs  Lab 07/15/23 1523 07/16/23 0733 07/18/23 0331 07/19/23 1330 07/19/23 1901 07/20/23 0143 07/20/23 0721  WBC 11.1* 8.3 5.8  --   --   --   --   NEUTROABS 9.1*  --   --   --   --   --   --   HGB 11.1* 9.4* 7.8* 7.3* 7.7* 7.2* 7.9*  HCT 35.9* 31.4* 26.0* 24.9* 25.4* 23.9* 26.5*  MCV 96.0 102.3* 102.0*  --   --   --   --   PLT 463* 339 294  --   --   --   --  Basic Metabolic Panel: Recent Labs  Lab 07/16/23 0733 07/17/23 0642 07/18/23 0331 07/18/23 1537 07/19/23 0343 07/20/23 0143  NA 135  --  140 140 139 142  K 4.5  --  2.4* 3.9 4.0 3.8  CL 105  --  105 105 108 113*  CO2 21*  --  23 26 22 25   GLUCOSE 70  --  61* 106* 79 89  BUN 30*  --  9 10 11 11   CREATININE 0.79 0.46 0.39* 0.54 0.57 0.64  CALCIUM 7.9*  --  7.9* 8.0* 7.6* 7.7*  MG  --   --  2.1  2.1  --  2.0 2.1  PHOS  --   --  1.7* 3.3 2.8 2.5   GFR: Estimated Creatinine Clearance: 44.2 mL/min (by C-G formula based on SCr of 0.64 mg/dL). Liver Function Tests: Recent Labs  Lab 07/15/23 1523 07/18/23 1537 07/19/23 0343 07/20/23 0143  AST 37  --   --   --   ALT 23  --   --   --   ALKPHOS 165*  --   --   --   BILITOT 1.1  --   --   --   PROT 7.1  --   --   --   ALBUMIN 2.9* 2.1* 1.6* 1.5*   No results for input(s): "LIPASE", "AMYLASE" in the last 168 hours. Recent Labs  Lab 07/21/23 1050  AMMONIA 22   Coagulation Profile: No results for input(s): "INR", "PROTIME" in the last 168 hours. Cardiac Enzymes: No results for input(s): "CKTOTAL", "CKMB", "CKMBINDEX", "TROPONINI" in the last 168 hours. BNP (last 3 results) No results for input(s): "PROBNP" in the last 8760  hours. HbA1C: No results for input(s): "HGBA1C" in the last 72 hours. CBG: Recent Labs  Lab 07/20/23 2006 07/20/23 2334 07/21/23 0355 07/21/23 0759 07/21/23 1141  GLUCAP 80 83 77 75 102*   Lipid Profile: No results for input(s): "CHOL", "HDL", "LDLCALC", "TRIG", "CHOLHDL", "LDLDIRECT" in the last 72 hours. Thyroid Function Tests: No results for input(s): "TSH", "T4TOTAL", "FREET4", "T3FREE", "THYROIDAB" in the last 72 hours. Anemia Panel: No results for input(s): "VITAMINB12", "FOLATE", "FERRITIN", "TIBC", "IRON", "RETICCTPCT" in the last 72 hours. Sepsis Labs: Recent Labs  Lab 07/15/23 1719 07/15/23 2328 07/16/23 0248 07/18/23 0331  LATICACIDVEN 3.1* >9.0* 3.0* 1.5    Recent Results (from the past 240 hours)  Resp panel by RT-PCR (RSV, Flu A&B, Covid) Anterior Nasal Swab     Status: Abnormal   Collection Time: 07/15/23  1:55 PM   Specimen: Anterior Nasal Swab  Result Value Ref Range Status   SARS Coronavirus 2 by RT PCR NEGATIVE NEGATIVE Final    Comment: (NOTE) SARS-CoV-2 target nucleic acids are NOT DETECTED.  The SARS-CoV-2 RNA is generally detectable in upper respiratory specimens during the acute phase of infection. The lowest concentration of SARS-CoV-2 viral copies this assay can detect is 138 copies/mL. A negative result does not preclude SARS-Cov-2 infection and should not be used as the sole basis for treatment or other patient management decisions. A negative result may occur with  improper specimen collection/handling, submission of specimen other than nasopharyngeal swab, presence of viral mutation(s) within the areas targeted by this assay, and inadequate number of viral copies(<138 copies/mL). A negative result must be combined with clinical observations, patient history, and epidemiological information. The expected result is Negative.  Fact Sheet for Patients:  BloggerCourse.com  Fact Sheet for Healthcare Providers:   SeriousBroker.it  This test is no t yet approved  or cleared by the [place] and  has been authorized for detection and/or diagnosis of SARS-CoV-2 by FDA under an Emergency Use Authorization (EUA). This EUA will remain  in effect (meaning this test can be used) for the duration of the COVID-19 declaration under Section 564(b)(1) of the Act, 21 U.S.C.section 360bbb-3(b)(1), unless the authorization is terminated  or revoked sooner.       Influenza A by PCR POSITIVE (A) NEGATIVE Final   Influenza B by PCR NEGATIVE NEGATIVE Final    Comment: (NOTE) The Xpert Xpress SARS-CoV-2/FLU/RSV plus assay is intended as an aid in the diagnosis of influenza from Nasopharyngeal swab specimens and should not be used as a sole basis for treatment. Nasal washings and aspirates are unacceptable for Xpert Xpress SARS-CoV-2/FLU/RSV testing.  Fact Sheet for Patients: BloggerCourse.com  Fact Sheet for Healthcare Providers: SeriousBroker.it  This test is not yet approved or cleared by the [place] FDA and has been authorized for detection and/or diagnosis of SARS-CoV-2 by FDA under an Emergency Use Authorization (EUA). This EUA will remain in effect (meaning this test can be used) for the duration of the COVID-19 declaration under Section 564(b)(1) of the Act, 21 U.S.C. section 360bbb-3(b)(1), unless the authorization is terminated or revoked.     Resp Syncytial Virus by PCR NEGATIVE NEGATIVE Final    Comment: (NOTE) Fact Sheet for Patients: BloggerCourse.com  Fact Sheet for Healthcare Providers: SeriousBroker.it  This test is not yet approved or cleared by the [place] FDA and has been authorized for detection and/or diagnosis of SARS-CoV-2 by FDA under an Emergency Use Authorization (EUA). This EUA will remain in effect (meaning this test can be used)  for the duration of the COVID-19 declaration under Section 564(b)(1) of the Act, 21 U.S.C. section 360bbb-3(b)(1), unless the authorization is terminated or revoked.  Performed at [facility], [address] [place], [place] [address]   Urine Culture     Status: Abnormal   Collection Time: 07/15/23  8:02 PM   Specimen: Urine, Catheterized  Result Value Ref Range Status   Specimen Description   Final    URINE, CATHETERIZED Performed at [facility], [address] [place], [place] [address]    Special Requests   Final    NONE Performed at [facility], [address], [place], [place] [address]    Culture >=100,000 COLONIES/mL ESCHERICHIA COLI (A)  Final   Report Status 07/17/2023 FINAL  Final   Organism ID, Bacteria ESCHERICHIA COLI (A)  Final      Susceptibility   Escherichia coli - MIC*    AMPICILLIN >=32 RESISTANT Resistant     CEFAZOLIN >=64 RESISTANT Resistant     CEFEPIME 2 SENSITIVE Sensitive     CEFTRIAXONE 32 RESISTANT Resistant     CIPROFLOXACIN >=4 RESISTANT Resistant     GENTAMICIN <=1 SENSITIVE Sensitive     IMIPENEM <=0.25 SENSITIVE Sensitive     NITROFURANTOIN <=16 SENSITIVE Sensitive     TRIMETH/SULFA >=320 RESISTANT Resistant     AMPICILLIN/SULBACTAM 4 SENSITIVE Sensitive     PIP/TAZO <=4 SENSITIVE Sensitive ug/mL    * >=100,000 COLONIES/mL ESCHERICHIA COLI  MRSA Next Gen by PCR, Nasal     Status: None   Collection Time: 07/16/23  6:26 AM   Specimen: Nasal Mucosa; Nasal Swab  Result Value Ref Range Status   MRSA by PCR Next Gen NOT DETECTED NOT DETECTED Final    Comment: (NOTE) The GeneXpert MRSA Assay (FDA approved  for NASAL specimens only), is one component of a comprehensive MRSA colonization surveillance program. It is not intended to diagnose MRSA infection nor to guide or monitor treatment for MRSA infections. Test performance is not FDA approved in patients less than 42  years old. Performed at [facility], [address] [place], [place] [address]   Culture, blood (Routine X 2) w Reflex to ID Panel     Status: None   Collection Time: 07/16/23  8:53 AM   Specimen: BLOOD LEFT HAND  Result Value Ref Range Status   Specimen Description   Final    BLOOD LEFT HAND Performed at [facility], [address] [place], [place] [address]    Special Requests   Final    BOTTLES DRAWN AEROBIC ONLY Blood Culture results may not be optimal due to an inadequate volume of blood received in culture bottles Performed at [facility], [address], [place], [place] [address]    Culture   Final    NO GROWTH 5 DAYS Performed at [facility], [address], [place], [place] [address]    Report Status 07/21/2023 FINAL  Final  Culture, blood (Routine X 2) w Reflex to ID Panel     Status: None   Collection Time: 07/16/23  8:53 AM   Specimen: BLOOD LEFT FOREARM  Result Value Ref Range Status   Specimen Description   Final    BLOOD LEFT FOREARM Performed at [facility], [address], [place], [place] [address]    Special Requests   Final    BOTTLES DRAWN AEROBIC ONLY Blood Culture results may not be optimal due to an inadequate volume of blood received in culture bottles Performed at [facility], [address], [place], [place] [address]    Culture   Final    NO GROWTH 5 DAYS Performed at [facility], [address], [place], [place] [address]    Report Status 07/21/2023 FINAL  Final         Radiology Studies: No results found.         LOS: 5 days   Time spent= 35 mins    Miguel Rota, MD [facility]  If 7PM-7AM, please contact night-coverage  07/21/2023, 1:11 PM

## 2023-07-21 NOTE — Progress Notes (Addendum)
 Daily Progress Note  DOA: 07/15/2023 Hospital Day: 7   Chief Complaint: Melena with anemia  ASSESSMENT    Brief Narrative:  Cindy Padilla is a 83 y.o. year old female with a history of dementia,  HTN, CAD on plavix, COPD, critical aortic stenosis, large hiatal hernia. Admitted 2/18 with sepsis / CAP / acute encephalopathy, UTI / lower extremity cellulitis. Developed dark stool and decline in hgb. GI saw in consult 2/23  GI bleed, presumably upper with melena On Plavix + Lovenox. 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 Last plavix dose was 2/21 Today :  Spoke with RN. No further melena.   Acute blood loss anemia Hgb 11.1 >> 7 range Today :  Hgb stable at 7.9  Severe sepsis  / acute encephalopathy ( poa) Recent Influenza A, E.coli UTI, and BLE cellulitis. On Rocephin and Invanz.  Today  First time seeing her but has some confusion regarding time, place. Answers some questions appropriately  Severe aortic stenosis  PLAN   --EGD on 2/26 following Plavix washout. Dr. Chales Abrahams previously discussed procedure with daughter Darl Pikes -- ? Need for long term Plavix or can it be switched to ASA.    --Continue BID IV Pantoprazole --Carafate suspension 1 gram QID --Monitor H/H. Keep hgb > 7. Has not required RBC transfusion  Subjective   No complaints today   Objective    Recent Labs    07/19/23 1901 07/20/23 0143 07/20/23 0721  HGB 7.7* 7.2* 7.9*  HCT 25.4* 23.9* 26.5*   BMET Recent Labs    07/18/23 1537 07/19/23 0343 07/20/23 0143  NA 140 139 142  K 3.9 4.0 3.8  CL 105 108 113*  CO2 26 22 25   GLUCOSE 106* 79 89  BUN 10 11 11   CREATININE 0.54 0.57 0.64  CALCIUM 8.0* 7.6* 7.7*   LFT Recent Labs    07/20/23 0143  ALBUMIN 1.5*   PT/INR No results for input(s): "LABPROT", "INR" in the last 72 hours.   Imaging:  VAS [place] ABI WITH/WO TBI  LOWER EXTREMITY DOPPLER STUDY  Patient Name:  Cindy Padilla  Date of Exam:    07/16/2023 Medical Rec #: [id number]              Accession #:    [id number] Date of Birth: 03/09/1941              Patient Gender: F Patient Age:   38 years Exam Location:  [facility] Procedure:      VAS [place] ABI WITH/WO TBI Referring Phys: ERIC [place]  --------------------------------------------------------------------------------   Indications: Ulceration.  High Risk Factors: Hypertension.   Limitations: Today's exam was limited due to patient positioning, patient              intolerant to cuff pressure, an open wound, bandages and              involuntary patient movement.  Comparison Study: No prior studies.  Performing Technologist: Olen Cordial RVT    Examination Guidelines: A complete evaluation includes at minimum, Doppler waveform signals and systolic blood pressure reading at the level of bilateral brachial, anterior tibial, and posterior tibial arteries, when vessel segments are accessible. Bilateral testing is considered an integral part of a complete examination. Photoelectric Plethysmograph (PPG) waveforms and toe systolic pressure readings are included as required and additional duplex testing as needed. Limited examinations for reoccurring indications may be performed as noted.    ABI Findings: +--------+------------------+-----+-----------+--------+ Right  Rt Pressure (mmHg)IndexWaveform   Comment  +--------+------------------+-----+-----------+--------+ [id number]                    triphasic           +--------+------------------+-----+-----------+--------+ PTA                            multiphasic         +--------+------------------+-----+-----------+--------+ DP                             multiphasic         +--------+------------------+-----+-----------+--------+  +----+------------------+-----+-----------+-------+ LeftLt Pressure (mmHg)IndexWaveform    Comment +----+------------------+-----+-----------+-------+ PTA                        multiphasic        +----+------------------+-----+-----------+-------+ DP                         multiphasic        +----+------------------+-----+-----------+-------+    Summary: Right:  Unable to obtain ABI due to patient pain tolerance, positioning, and constant movement. Waveforms detected in the posterior tibial, and dorsalis pedis arteries are noted to be multiphasic. Unable to obtain TBI due to patient constant movement. Left:  Unable to obtain ABI due to patient pain tolerance, positioning, open wound, bandages, and constant movement. Waveforms detected in the posterior tibial, and dorsalis pedis arteries are noted to be multiphasic. Unable to obtain TBI due to patient constant movement, and great toe anatomy. *See table(s) above for measurements and observations.    Electronically signed by Coral Else MD on 07/16/2023 at 8:13:29 PM.      Final       Scheduled inpatient medications:   amLODipine  5 mg Oral Daily   atorvastatin  20 mg Oral QPM   Chlorhexidine Gluconate Cloth  6 each Topical Daily   donepezil  10 mg Oral QHS   fluticasone furoate-vilanterol  1 puff Inhalation Daily   lamoTRIgine  200 mg Oral BID   LORazepam  0.5 mg Oral BID   montelukast  10 mg Oral QHS   pantoprazole (PROTONIX) IV  40 mg Intravenous Q12H   QUEtiapine  100 mg Oral QHS   QUEtiapine  50 mg Oral Daily   sucralfate  1 g Oral Q6H   umeclidinium bromide  1 puff Inhalation Daily   Continuous inpatient infusions:   cefTRIAXone (ROCEPHIN)  IV 2 g (07/20/23 1308)   vancomycin 750 mg (07/20/23 0946)   PRN inpatient medications: acetaminophen **OR** acetaminophen, hydrALAZINE, ipratropium-albuterol, metoprolol tartrate, ondansetron **OR** ondansetron (ZOFRAN) IV, oxyCODONE, senna-docusate, traZODone  Vital signs in last 24 hours: Temp:  [98.1 F (36.7 C)-99.1 F (37.3 C)] 98.6 F (37  C) (02/24 0353) Pulse Rate:  [81-89] 81 (02/24 0353) Resp:  [14-16] 14 (02/24 0353) BP: (128-136)/(50-57) 136/57 (02/24 0353) SpO2:  [98 %-99 %] 99 % (02/24 0353) [name] : 07/20/23  Intake/Output Summary (Last 24 hours) at 07/21/2023 0911 Last data filed at 07/21/2023 0548 Gross per 24 hour  Intake 470 ml  Output 830 ml  Net -360 ml    Intake/Output from previous day: 02/23 0701 - 02/24 0700 In: 470 [P.O.:120; IV Piggyback:350] Out: 830 [Urine:830] Intake/Output this shift: No intake/output data recorded.   Physical Exam:  General: Alert female in NAD Heart:  Regular rate and rhythm.  Pulmonary: Normal respiratory effort Abdomen: Soft, nondistended, nontender. Normal bowel sounds. Psych: Pleasant. Cooperative.      LOS: 5 days   Willette Cluster ,NP 07/21/2023, 9:11 AM  GI ATTENDING  Case discussed with Dr. Chales Abrahams who is her initially.  Interval history and data reviewed.  Agree with interval progress note as outlined above.  Appears to had a transient upper GI bleed in the face of antiplatelet and anticoagulation therapies.  Currently stable.  Previous endoscopic evaluations as noted.  Continue with supportive care off of antiplatelet and anticoagulation therapies, including PPI and sucralfate therapies.  Transfuse as needed.  Plan for elective upper endoscopy when appropriate.  Will follow.  Wilhemina Bonito. Eda Keys., M.D. [facility]

## 2023-07-21 NOTE — Plan of Care (Signed)
  Problem: Coping: Goal: Level of anxiety will decrease Outcome: Progressing   Problem: Pain Managment: Goal: General experience of comfort will improve and/or be controlled Outcome: Progressing   Problem: Safety: Goal: Ability to remain free from injury will improve Outcome: Progressing   Problem: Skin Integrity: Goal: Risk for impaired skin integrity will decrease Outcome: Progressing

## 2023-07-21 NOTE — Hospital Course (Addendum)
 Brief Narrative:   83 year old female, with past medical history significant for dementia, HTN, HLD, CAD, COPD, mood disorder who presented to [facility] ED on 2/18 via EMS from [facility] memory care facility with confusion, progressive decline in activity over the preceding week.  Recently diagnosed with ESBL UTI and influenza.  Now admitted for failure to thrive causing electrolyte imbalance, encephalopathy and Hemoccult stool.  [facility] consulted, planning endoscopy.  EGD 2/28= Nonbleeding ulcer, PPI BID x 2 months thereafter daily.    Assessment & Plan:  Principal Problem:   Community acquired bacterial pneumonia Active Problems:   Sepsis (HCC)    Acute metabolic encephalopathy:  Overall improving.  Likely from multiple underlying ongoing issue including infection from ESBL UTI BS ok, Ammonia is normal.  CT head showed chronic changes.  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     Severe sepsis due to ESBL UTI, POA Sepsis physiology is improving.  Urine cultures from 2/18 reviewed.  Showing ESBL -Completed 5 days of ertapenem 2/18 - 2/22  Acute hypoxia, improved - As needed bronchodilators.  Now on room air   Anemia, likely acute blood loss anemia Macrocytosis Baseline hemoglobin 11, admission hemoglobin 7.8 with positive Hemoccult stool.  Currently on PPI twice daily and Carafate - EGD 2021 showed esophagitis and Sheria Lang lesion - EGD 2/28= Nonbleeding ulcer, PPI BID x 2 months thereafter daily.    Bilateral lower extremity cellulitis Left lower extremity necrotic wound -Left shin wounds from wheelchair incident per family as another residents of the facility ran into her leg a few weeks ago. -Due to ongoing concerns of this currently on IV Linezolid and Rocephin  (total 14 days; EOT 3/4) -Seen by wound care team   Acute renal failure: Resolved -Baseline creatinine 0.6, admission creatinine 1.6    Dysphagia -Continue dysphagia 2 diet   Recent influenza A viral infection Originally diagnosed with influenza A on 07/04/2023; completed course of Tamiflu at facility.   Hyperkalemia: Resolved        Hypokalemia/hypophosphatemia -As needed repletion   Acute urinary retention Foley removed on 3/1 but due to persistent retention, placed again on 3/2 Will need outptn urology follow up   Pleural effusion Severe aortic stenosis Reviewed TTE from December 2024 with preserved LVEF, grade 1 diastolic dysfunction, large pleural effusion and severe aortic stenosis.  Patient is asymptomatic, no chest pain, no tamponade physiology.  Has outpatient appointment coming up with cardiology, Dr. Odis Hollingshead on 08/11/2023   Essential hypertension -Amlodipine 5 mg p.o. daily. Iv prn   Hyperlipidemia -Atorvastatin 20mg  p.o. daily   CAD Lipitor continue atorvastatin 20 mg p.o. daily -Plavix on hold   COPD:  -Bronchodilators   Dementia Mood disorder Adult failure to thrive -Cont bedtime Seroquel, dc am Seroquel. Lamictal reduced 150mg  bid (used to be on 200mg )= bid)   Weakness/deconditioning/gait disturbance/debility: Patient residing at [facility] memory care facility.  Most recent baseline able to ambulate with the use of a walker but since has been utilizing a wheelchair due to acute decline with recent diagnosis of influenza A on 2/7. -PT/OT evaluation  PICC placed on 2/26 due to lack of IV Access. Will be removed prior to dc.      DVT prophylaxis: SCDs Start: 07/15/23 2126  DNR/DNI Family Communication: Clide Cliff Dc On Monday per TOC.  Subjective: No complaints feeling better Foley has to reinserted  Examination:  General exam: Appears calm and comfortable, cachectic frail Respiratory  system: Clear to auscultation. Respiratory effort normal. Cardiovascular system: S1 & S2 heard, RRR. No JVD, murmurs, rubs, gallops or clicks. No pedal edema. Gastrointestinal system: Abdomen  is nondistended, soft and nontender. No organomegaly or masses felt. Normal bowel sounds heard. Central nervous system: Alert and oriented. No focal neurological deficits. Extremities: Symmetric 4 x 5 power. Skin: No rashes, lesions or ulcers Psychiatry: Judgement and insight appear poor foley

## 2023-07-21 NOTE — Progress Notes (Signed)
 1240 pm: Pt noted to be lethargic and nonverbal during the round. Vitals collected BP 124/57, pulse 80, resp 18, temp 98.0, O2 100%. MD assessed the pt in the room. MD made aware of poor appetite as well. New orders given. Fluid started as ordered. Blood sugar stable.   1400: Pt more alert now and started to communicate with the staffs. Encouraged fluid intake. Drank orange juice with few spoons of apple sauce. Wound dressing changed to LLE and sacral. Call bell in reach. Will continue to monitor. Bed low and locked.

## 2023-07-22 DIAGNOSIS — Z7902 Long term (current) use of antithrombotics/antiplatelets: Secondary | ICD-10-CM | POA: Diagnosis not present

## 2023-07-22 DIAGNOSIS — J159 Unspecified bacterial pneumonia: Secondary | ICD-10-CM | POA: Diagnosis not present

## 2023-07-22 DIAGNOSIS — K921 Melena: Secondary | ICD-10-CM | POA: Diagnosis not present

## 2023-07-22 DIAGNOSIS — D62 Acute posthemorrhagic anemia: Secondary | ICD-10-CM | POA: Diagnosis not present

## 2023-07-22 LAB — BASIC METABOLIC PANEL
Anion gap: 6 (ref 5–15)
BUN: 12 mg/dL (ref 8–23)
CO2: 23 mmol/L (ref 22–32)
Calcium: 7.9 mg/dL — ABNORMAL LOW (ref 8.9–10.3)
Chloride: 113 mmol/L — ABNORMAL HIGH (ref 98–111)
Creatinine, Ser: 0.69 mg/dL (ref 0.44–1.00)
GFR, Estimated: >60 mL/min (ref >60)
Glucose, Bld: 122 mg/dL — ABNORMAL HIGH (ref 70–99)
Potassium: 3.6 mmol/L (ref 3.5–5.1)
Sodium: 142 mmol/L (ref 135–145)

## 2023-07-22 LAB — CBC
HCT: 26.9 % — ABNORMAL LOW (ref 36.0–46.0)
Hemoglobin: 7.9 g/dL — ABNORMAL LOW (ref 12.0–15.0)
MCH: 29.9 pg (ref 26.0–34.0)
MCHC: 29.4 g/dL — ABNORMAL LOW (ref 30.0–36.0)
MCV: 101.9 fL — ABNORMAL HIGH (ref 80.0–100.0)
Platelets: 222 10*3/uL (ref 150–400)
RBC: 2.64 MIL/uL — ABNORMAL LOW (ref 3.87–5.11)
RDW: 20.1 % — ABNORMAL HIGH (ref 11.5–15.5)
WBC: 4.2 10*3/uL (ref 4.0–10.5)
nRBC: 0 % (ref 0.0–0.2)

## 2023-07-22 LAB — MAGNESIUM: Magnesium: 2.1 mg/dL (ref 1.7–2.4)

## 2023-07-22 LAB — GLUCOSE, CAPILLARY
Glucose-Capillary: 107 mg/dL — ABNORMAL HIGH (ref 70–99)
Glucose-Capillary: 118 mg/dL — ABNORMAL HIGH (ref 70–99)
Glucose-Capillary: 118 mg/dL — ABNORMAL HIGH (ref 70–99)
Glucose-Capillary: 124 mg/dL — ABNORMAL HIGH (ref 70–99)
Glucose-Capillary: 130 mg/dL — ABNORMAL HIGH (ref 70–99)
Glucose-Capillary: 97 mg/dL (ref 70–99)

## 2023-07-22 LAB — PHOSPHORUS: Phosphorus: 2.3 mg/dL — ABNORMAL LOW (ref 2.5–4.6)

## 2023-07-22 MED ORDER — POTASSIUM PHOSPHATES 15 MMOLE/5ML IV SOLN
30.0000 mmol | Freq: Once | INTRAVENOUS | Status: AC
  Administered 2023-07-22: 30 mmol via INTRAVENOUS
  Filled 2023-07-22: qty 10

## 2023-07-22 MED ORDER — CEFADROXIL 500 MG PO CAPS
500.0000 mg | ORAL_CAPSULE | Freq: Two times a day (BID) | ORAL | Status: DC
  Filled 2023-07-22: qty 1

## 2023-07-22 MED ORDER — SODIUM CHLORIDE 0.9 % IV SOLN
2.0000 g | INTRAVENOUS | Status: DC
  Administered 2023-07-22 – 2023-07-25 (×3): 2 g via INTRAVENOUS
  Filled 2023-07-22 (×2): qty 20

## 2023-07-22 MED ORDER — LINEZOLID 600 MG PO TABS: 600.0000 mg | ORAL_TABLET | Freq: Two times a day (BID) | ORAL | Status: DC

## 2023-07-22 MED ORDER — LINEZOLID 600 MG/300ML IV SOLN
600.0000 mg | Freq: Two times a day (BID) | INTRAVENOUS | Status: DC
  Administered 2023-07-23 – 2023-07-27 (×8): 600 mg via INTRAVENOUS
  Filled 2023-07-22 (×9): qty 300

## 2023-07-22 NOTE — Plan of Care (Signed)

## 2023-07-22 NOTE — Progress Notes (Addendum)
 PROGRESS NOTE    Cindy Padilla  QIO:[id number] DOB: 1940-09-27 DOA: 07/15/2023 PCP: Valere Dross, FNP    Brief Narrative:   83 year old female, with past medical history significant for dementia, HTN, HLD, CAD, COPD, mood disorder who presented to [facility] ED on 2/18 via EMS from [facility] memory care facility with confusion, progressive decline in activity over the preceding week.  Recently diagnosed with ESBL UTI and influenza.  Now admitted for failure to thrive causing electrolyte imbalance, encephalopathy and Hemoccult stool.  [facility] consulted, planning EGD 2/26.   Assessment & Plan:  Principal Problem:   Community acquired bacterial pneumonia Active Problems:   Sepsis (HCC)    Acute metabolic encephalopathy:  Overall improving.  Likely from multiple underlying ongoing issue including infection from ESBL UTI BS ok, Ammonia is normal.  CT head showed chronic changes.  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  Mentation improved   Severe sepsis, POA ESBL E. coli UTI Lactic acidosis, resolved Sepsis physiology is improving.  Urine cultures from 2/18 reviewed.  Showing ESBL -Completed 5 days of ertapenem 2/18 - 2/22    Anemia, likely acute blood loss anemia Macrocytosis Baseline hemoglobin 11, admission hemoglobin 7.8 with positive Hemoccult stool.  Currently on PPI twice daily and Carafate - EGD 2021 showed esophagitis and Sheria Lang lesion - [facility] following, planning on endoscopy   Bilateral lower extremity cellulitis Left lower extremity necrotic wound -Left shin wounds from wheelchair incident per family as another residents of the facility ran into her leg a few weeks ago. -Due to ongoing concerns of this currently on IV vancomycin and Rocephin > Duricef and Zyvox (total 14 days) -Seen by wound care team   Acute renal failure: Resolved -Resolved -Baseline creatinine 0.6, admission creatinine  1.6   Dysphagia -Continue dysphagia 2 diet   Recent influenza A viral infection Originally diagnosed with influenza A on 07/04/2023; completed course of Tamiflu at facility.   Hyperkalemia: Resolved        Hypokalemia/hypophosphatemia -As needed repletion   Acute urinary retention Foley catheter was placed in the ED for acute urinary retention. -will attempt foley removal prior to dc.    Pleural effusion Severe aortic stenosis Reviewed TTE from December 2024 with preserved LVEF, grade 1 diastolic dysfunction, large pleural effusion and severe aortic stenosis.  Patient is asymptomatic, no chest pain, no tamponade physiology.  Has outpatient appointment coming up with cardiology, Dr. Odis Hollingshead on 08/11/2023   Essential hypertension -Amlodipine 5 mg p.o. daily. Iv prn   Hyperlipidemia -Atorvastatin 20mg  p.o. daily   CAD Lipitor continue atorvastatin 20 mg p.o. daily -Plavix on hold   COPD:  -Bronchodilators    Dementia Mood disorder Adult failure to thrive   Weakness/deconditioning/gait disturbance/debility: Patient residing at [facility] memory care facility.  Most recent baseline able to ambulate with the use of a walker but since has been utilizing a wheelchair due to acute decline with recent diagnosis of influenza A on 2/7. -PT/OT evaluation     DVT prophylaxis: SCDs Start: 07/15/23 2126    Code Status: Limited: Do not attempt resuscitation (DNR) -DNR-LIMITED -Do Not Intubate/DNI  Family Communication: Called Susan Continue hospital stay for at least next 48 hours  Subjective: Doing ok  No complaints.    Examination:  General exam: Appears calm and comfortable, cachectic frail Respiratory system: Clear to auscultation. Respiratory effort normal. Cardiovascular system: S1 & S2 heard, RRR. No JVD, murmurs, rubs, gallops  or clicks. No pedal edema. Gastrointestinal system: Abdomen is nondistended, soft and nontender. No organomegaly or masses felt. Normal bowel  sounds heard. Central nervous system: Alert and oriented. No focal neurological deficits. Extremities: Symmetric 4 x 5 power. Skin: No rashes, lesions or ulcers Psychiatry: Judgement and insight appear poor            Pressure Injury Sacrum Right;Left;Mid Stage 1 -  Intact skin with non-blanchable redness of a localized area usually over a bony prominence. (Active)     Location: Sacrum  Location Orientation: Right;Left;Mid  Staging: Stage 1 -  Intact skin with non-blanchable redness of a localized area usually over a bony prominence.  Wound Description (Comments):   Present on Admission:      Diet Orders (From admission, onward)     Start     Ordered   07/22/23 2359  Diet NPO time specified  Diet effective ____        07/21/23 0949   07/20/23 1247  DIET DYS 2 Room service appropriate? Yes; Fluid consistency: Thin  Diet effective now       Question Answer Comment  Room service appropriate? Yes   Fluid consistency: Thin      07/20/23 1247            Objective: Vitals:   07/21/23 1700 07/21/23 2123 07/22/23 0553 07/22/23 0935  BP: (!) 124/54 (!) 128/59 (!) 127/53   Pulse: 80 88 72   Resp: 18 16 18    Temp: 98.2 F (36.8 C) 98.6 F (37 C) 99 F (37.2 C)   TempSrc: Oral Oral    SpO2: 100% 96% 100% 97%  Weight:      Height:        Intake/Output Summary (Last 24 hours) at 07/22/2023 1132 Last data filed at 07/22/2023 0600 Gross per 24 hour  Intake 1439.2 ml  Output 300 ml  Net 1139.2 ml   Filed Weights   07/16/23 0000 07/16/23 0330  Weight: 70.3 kg 63.3 kg    Scheduled Meds:  amLODipine  5 mg Oral Daily   atorvastatin  20 mg Oral QPM   cefadroxil  500 mg Oral BID   Chlorhexidine Gluconate Cloth  6 each Topical Daily   donepezil  10 mg Oral QHS   fluticasone furoate-vilanterol  1 puff Inhalation Daily   lamoTRIgine  200 mg Oral BID   linezolid  600 mg Oral Q12H   montelukast  10 mg Oral QHS   pantoprazole (PROTONIX) IV  40 mg Intravenous Q12H    QUEtiapine  100 mg Oral QHS   QUEtiapine  50 mg Oral Daily   sucralfate  1 g Oral Q6H   umeclidinium bromide  1 puff Inhalation Daily   Continuous Infusions:  dextrose 5 % and 0.45 % NaCl 75 mL/hr at 07/22/23 0051   potassium PHOSPHATE IVPB (in mmol)      Nutritional status     Body mass index is 27.25 kg/m.  Data Reviewed:   CBC: Recent Labs  Lab 07/15/23 1523 07/16/23 0733 07/18/23 0331 07/19/23 1330 07/19/23 1901 07/20/23 0143 07/20/23 0721 07/22/23 0317  WBC 11.1* 8.3 5.8  --   --   --   --  4.2  NEUTROABS 9.1*  --   --   --   --   --   --   --   HGB 11.1* 9.4* 7.8* 7.3* 7.7* 7.2* 7.9* 7.9*  HCT 35.9* 31.4* 26.0* 24.9* 25.4* 23.9* 26.5* 26.9*  MCV 96.0 102.3* 102.0*  --   --   --   --  101.9*  PLT 463* 339 294  --   --   --   --  222   Basic Metabolic Panel: Recent Labs  Lab 07/18/23 0331 07/18/23 1537 07/19/23 0343 07/20/23 0143 07/22/23 0317  NA 140 140 139 142 142  K 2.4* 3.9 4.0 3.8 3.6  CL 105 105 108 113* 113*  CO2 23 26 22 25 23   GLUCOSE 61* 106* 79 89 122*  BUN 9 10 11 11 12   CREATININE 0.39* 0.54 0.57 0.64 0.69  CALCIUM 7.9* 8.0* 7.6* 7.7* 7.9*  MG 2.1  2.1  --  2.0 2.1 2.1  PHOS 1.7* 3.3 2.8 2.5 2.3*   GFR: Estimated Creatinine Clearance: 44.2 mL/min (by C-G formula based on SCr of 0.69 mg/dL). Liver Function Tests: Recent Labs  Lab 07/15/23 1523 07/18/23 1537 07/19/23 0343 07/20/23 0143  AST 37  --   --   --   ALT 23  --   --   --   ALKPHOS 165*  --   --   --   BILITOT 1.1  --   --   --   PROT 7.1  --   --   --   ALBUMIN 2.9* 2.1* 1.6* 1.5*   No results for input(s): "LIPASE", "AMYLASE" in the last 168 hours. Recent Labs  Lab 07/21/23 1050  AMMONIA 22   Coagulation Profile: No results for input(s): "INR", "PROTIME" in the last 168 hours. Cardiac Enzymes: No results for input(s): "CKTOTAL", "CKMB", "CKMBINDEX", "TROPONINI" in the last 168 hours. BNP (last 3 results) No results for input(s): "PROBNP" in the last 8760  hours. HbA1C: No results for input(s): "HGBA1C" in the last 72 hours. CBG: Recent Labs  Lab 07/21/23 1614 07/21/23 2013 07/22/23 0015 07/22/23 0414 07/22/23 0805  GLUCAP 111* 118* 107* 124* 130*   Lipid Profile: No results for input(s): "CHOL", "HDL", "LDLCALC", "TRIG", "CHOLHDL", "LDLDIRECT" in the last 72 hours. Thyroid Function Tests: No results for input(s): "TSH", "T4TOTAL", "FREET4", "T3FREE", "THYROIDAB" in the last 72 hours. Anemia Panel: No results for input(s): "VITAMINB12", "FOLATE", "FERRITIN", "TIBC", "IRON", "RETICCTPCT" in the last 72 hours. Sepsis Labs: Recent Labs  Lab 07/15/23 1719 07/15/23 2328 07/16/23 0248 07/18/23 0331  LATICACIDVEN 3.1* >9.0* 3.0* 1.5    Recent Results (from the past 240 hours)  Resp panel by RT-PCR (RSV, Flu A&B, Covid) Anterior Nasal Swab     Status: Abnormal   Collection Time: 07/15/23  1:55 PM   Specimen: Anterior Nasal Swab  Result Value Ref Range Status   SARS Coronavirus 2 by RT PCR NEGATIVE NEGATIVE Final    Comment: (NOTE) SARS-CoV-2 target nucleic acids are NOT DETECTED.  The SARS-CoV-2 RNA is generally detectable in upper respiratory specimens during the acute phase of infection. The lowest concentration of SARS-CoV-2 viral copies this assay can detect is 138 copies/mL. A negative result does not preclude SARS-Cov-2 infection and should not be used as the sole basis for treatment or other patient management decisions. A negative result may occur with  improper specimen collection/handling, submission of specimen other than nasopharyngeal swab, presence of viral mutation(s) within the areas targeted by this assay, and inadequate number of viral copies(<138 copies/mL). A negative result must be combined with clinical observations, patient history, and epidemiological information. The expected result is Negative.  Fact Sheet for Patients:  BloggerCourse.com  Fact Sheet for Healthcare  Providers:  SeriousBroker.it  This test is no t yet approved or cleared by the [place] and  has been authorized for  detection and/or diagnosis of SARS-CoV-2 by FDA under an Emergency Use Authorization (EUA). This EUA will remain  in effect (meaning this test can be used) for the duration of the COVID-19 declaration under Section 564(b)(1) of the Act, 21 U.S.C.section 360bbb-3(b)(1), unless the authorization is terminated  or revoked sooner.       Influenza A by PCR POSITIVE (A) NEGATIVE Final   Influenza B by PCR NEGATIVE NEGATIVE Final    Comment: (NOTE) The Xpert Xpress SARS-CoV-2/FLU/RSV plus assay is intended as an aid in the diagnosis of influenza from Nasopharyngeal swab specimens and should not be used as a sole basis for treatment. Nasal washings and aspirates are unacceptable for Xpert Xpress SARS-CoV-2/FLU/RSV testing.  Fact Sheet for Patients: BloggerCourse.com  Fact Sheet for Healthcare Providers: SeriousBroker.it  This test is not yet approved or cleared by the [place] FDA and has been authorized for detection and/or diagnosis of SARS-CoV-2 by FDA under an Emergency Use Authorization (EUA). This EUA will remain in effect (meaning this test can be used) for the duration of the COVID-19 declaration under Section 564(b)(1) of the Act, 21 U.S.C. section 360bbb-3(b)(1), unless the authorization is terminated or revoked.     Resp Syncytial Virus by PCR NEGATIVE NEGATIVE Final    Comment: (NOTE) Fact Sheet for Patients: BloggerCourse.com  Fact Sheet for Healthcare Providers: SeriousBroker.it  This test is not yet approved or cleared by the [place] FDA and has been authorized for detection and/or diagnosis of SARS-CoV-2 by FDA under an Emergency Use Authorization (EUA). This EUA will remain in effect (meaning this test  can be used) for the duration of the COVID-19 declaration under Section 564(b)(1) of the Act, 21 U.S.C. section 360bbb-3(b)(1), unless the authorization is terminated or revoked.  Performed at [facility], [address] [place], [place] [address]   Urine Culture     Status: Abnormal   Collection Time: 07/15/23  8:02 PM   Specimen: Urine, Catheterized  Result Value Ref Range Status   Specimen Description   Final    URINE, CATHETERIZED Performed at [facility], [address], [place], [place] [address]    Special Requests   Final    NONE Performed at [facility], [address] [place], [place] [address]    Culture >=100,000 COLONIES/mL ESCHERICHIA COLI (A)  Final   Report Status 07/17/2023 FINAL  Final   Organism ID, Bacteria ESCHERICHIA COLI (A)  Final      Susceptibility   Escherichia coli - MIC*    AMPICILLIN >=32 RESISTANT Resistant     CEFAZOLIN >=64 RESISTANT Resistant     CEFEPIME 2 SENSITIVE Sensitive     CEFTRIAXONE 32 RESISTANT Resistant     CIPROFLOXACIN >=4 RESISTANT Resistant     GENTAMICIN <=1 SENSITIVE Sensitive     IMIPENEM <=0.25 SENSITIVE Sensitive     NITROFURANTOIN <=16 SENSITIVE Sensitive     TRIMETH/SULFA >=320 RESISTANT Resistant     AMPICILLIN/SULBACTAM 4 SENSITIVE Sensitive     PIP/TAZO <=4 SENSITIVE Sensitive ug/mL    * >=100,000 COLONIES/mL ESCHERICHIA COLI  MRSA Next Gen by PCR, Nasal     Status: None   Collection Time: 07/16/23  6:26 AM   Specimen: Nasal Mucosa; Nasal Swab  Result Value Ref Range Status   MRSA by PCR Next Gen NOT DETECTED NOT DETECTED Final    Comment: (NOTE) The GeneXpert MRSA Assay (FDA approved for NASAL specimens only), is one component of a comprehensive MRSA colonization surveillance  program. It is not intended to diagnose MRSA infection nor to guide or monitor treatment for MRSA infections. Test performance is not FDA approved in patients less than 4  years old. Performed at [facility], [address], [place], [place] [address]   Culture, blood (Routine X 2) w Reflex to ID Panel     Status: None   Collection Time: 07/16/23  8:53 AM   Specimen: BLOOD LEFT HAND  Result Value Ref Range Status   Specimen Description   Final    BLOOD LEFT HAND Performed at [facility], [address] [place], [place] [address]    Special Requests   Final    BOTTLES DRAWN AEROBIC ONLY Blood Culture results may not be optimal due to an inadequate volume of blood received in culture bottles Performed at [facility], [address], [place], [place] [address]    Culture   Final    NO GROWTH 5 DAYS Performed at [facility], [address], [place], [place] [address]    Report Status 07/21/2023 FINAL  Final  Culture, blood (Routine X 2) w Reflex to ID Panel     Status: None   Collection Time: 07/16/23  8:53 AM   Specimen: BLOOD LEFT FOREARM  Result Value Ref Range Status   Specimen Description   Final    BLOOD LEFT FOREARM Performed at [facility], [address] [place], [place] [address]    Special Requests   Final    BOTTLES DRAWN AEROBIC ONLY Blood Culture results may not be optimal due to an inadequate volume of blood received in culture bottles Performed at [facility], [address] [place], [place] [address]    Culture   Final    NO GROWTH 5 DAYS Performed at [facility], [address] [place], [place] [address]    Report Status 07/21/2023 FINAL  Final         Radiology Studies: No results found.         LOS: 6 days   Time spent= 35 mins    Miguel Rota, MD [facility]  If 7PM-7AM, please contact night-coverage  07/22/2023, 11:32 AM

## 2023-07-22 NOTE — Progress Notes (Signed)
 Occupational Therapy Treatment Patient Details Name: Cindy Padilla MRN: [id number] DOB: [birth year] Today's Date: 07/22/2023   History of present illness Patient is a 83 year old female who presented from memory care unit  with weakness, CAP on 07/15/23. Patient was noted to have been diagnosed with influenza and UTI 07/04/23 .patient was admitted with acute encephalopathy, severe sepsis secondary to UTI and AKI.   PMH: bipolar, dementia, DM, GERD, IBS, HTN, COPD.   OT comments  Patient was noted to have limited ROM of neck with and BUE in bed with patient repeatedly asking for police. Patient needed TD for positioning in bed with pillows and towels to promote neutral positioning of neck. Patient was noted to have continued coughing with small sip of tea with increased coughing even with patient upright in chair position in bed. Patient noted to need increased A for engagement in ADLs. Patient's discharge plan remains appropriate at this time. OT will continue to follow acutely.       If plan is discharge home, recommend the following:  Two people to help with walking and/or transfers;A lot of help with bathing/dressing/bathroom;Assistance with cooking/housework;Direct supervision/assist for medications management;Assist for transportation;Help with stairs or ramp for entrance;Direct supervision/assist for financial management;Assistance with feeding;Supervision due to cognitive status   Equipment Recommendations  None recommended by OT       Precautions / Restrictions Precautions Precautions: Fall Recall of Precautions/Restrictions: Impaired Restrictions Weight Bearing Restrictions Per Provider Order: No Other Position/Activity Restrictions: wound on LLE/cellulitis/painful              ADL either performed or assessed with clinical judgement   ADL Overall ADL's : Needs assistance/impaired Eating/Feeding: Maximal assistance Eating/Feeding Details (indicate cue type and reason):  patient not engaging hands to participate in feeding at all. patient not moving hands to hold cup. patient was noted to have cough after intial sip of tea with patient coughing up significant ammount of sputum with nurse called into room to start suction. nurse in room at end of session.             General ADL Comments: patient was noted to have head positioned with chin down and to L side. patient was repositioned in bed with +2 with pillows added to the L side to engage gravity in patients positioning of neck in bed. two towels were used on either side of neck to support neck in neutral. patient's daughter and nurse in room educated as well.    Extremity/Trunk Assessment Upper Extremity Assessment Upper Extremity Assessment:  (noted to have edema in BUE with patient not engaged in movement)             Cognition Arousal: Alert Behavior During Therapy: Flat affect Cognition: Cognition impaired             OT - Cognition Comments: patients daughter was present in room. patient repeatdly asking for police and to call them. patient was less engaged in session today compared to eval.                 Following commands: Impaired Following commands impaired: Follows one step commands inconsistently                    Pertinent Vitals/ Pain       Pain Assessment Pain Assessment: Faces Faces Pain Scale: Hurts even more Pain Location: grimancing with adjustments in bed. Pain Descriptors / Indicators: Grimacing Pain Intervention(s): Limited activity within patient's tolerance, Monitored during session  Frequency  Min 1X/week        Progress Toward Goals  OT Goals(current goals can now be found in the care plan section)  Progress towards OT goals: Not progressing toward goals - comment (change in status since evaluation more A for all tasks.)     Plan         AM-PAC OT "6 Clicks" Daily Activity     Outcome Measure   Help from another person  eating meals?: A Lot Help from another person taking care of personal grooming?: Total Help from another person toileting, which includes using toliet, bedpan, or urinal?: Total Help from another person bathing (including washing, rinsing, drying)?: Total Help from another person to put on and taking off regular upper body clothing?: Total Help from another person to put on and taking off regular lower body clothing?: Total 6 Click Score: 7    End of Session    OT Visit Diagnosis: Unsteadiness on feet (R26.81);Pain;Muscle weakness (generalized) (M62.81);Other symptoms and signs involving cognitive function   Activity Tolerance Patient limited by pain   Patient Left in bed;with call bell/phone within reach;with bed alarm set   Nurse Communication Mobility status        Time: 8295-6213 OT Time Calculation (min): 16 min  Charges: OT General Charges $OT Visit: 1 Visit OT Treatments $Self Care/Home Management : 8-22 mins  Rosalio Loud, MS Acute Rehabilitation Department Office# [phone]   Selinda Flavin 07/22/2023, 4:00 PM

## 2023-07-22 NOTE — Progress Notes (Addendum)
 Daily Progress Note  DOA: 07/15/2023 Hospital Day: 8   Chief Complaint: GI bleed with melena and anemia  ASSESSMENT    Brief Narrative:  Cindy Padilla is a 83 y.o. year old female with a history of  dementia, HTN, CAD on plavix, COPD, critical aortic stenosis, large hiatal hernia. Admitted 2/18 with sepsis / CAP / acute encephalopathy, UTI / lower extremity cellulitis. Developed dark stool and decline in hgb. GI saw in consult 2/23    GI bleed, presumably upper with melena On Plavix + Lovenox. 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 Last plavix dose was 2/21 Today:  No BMs / bleeding today. Per Nursing she had a small black BM last evening:   Acute blood loss anemia Hgb 11.1 >> 7.2 range this admission Today:  Hgb remains stable at 7.9  Severe sepsis  ( CAP, UTI, cellulitis) and / acute encephalopathy ( poa) Recent Influenza A, E.coli UTI, CAP / BLE cellulitis. On Rocephin and Invanz.  Today:  WBC normal. Afebrile. On PO antibiotics now. On 2 L 02 and saturation is good but coughing and there are diminished breath sounds bilaterally. Being treated for CAP. Separately, she doesn't seems to be eating or drinking much. Hasmaintenance IVF going. Unclear why she isn't eating but probably multifactorial ( illness, dementia). Denies dysphagia / nausea or vomiting but not likely answers not reliable   Dementia.  Baseline unknown but not oriented today  Severe aortic stenosis   PLAN   --EGD on 2/26. Dr. Chales Abrahams previously discussed procedure with daughter Darl Pikes and RN getting phone consent today -- ? Need for long term Plavix or can it be switched to ASA.    --Continue BID IV Pantoprazole --Continue Carafate suspension 1 gram QID --Monitor H/H.   Subjective   No complaints. Not eating and drinking very little per nursing staff. Small black BM last night   Objective     Recent Labs    07/20/23 0143 07/20/23 0721 07/22/23 0317  WBC  --   --   4.2  HGB 7.2* 7.9* 7.9*  HCT 23.9* 26.5* 26.9*  PLT  --   --  222   BMET Recent Labs    07/20/23 0143 07/22/23 0317  NA 142 142  K 3.8 3.6  CL 113* 113*  CO2 25 23  GLUCOSE 89 122*  BUN 11 12  CREATININE 0.64 0.69  CALCIUM 7.7* 7.9*   LFT Recent Labs    07/20/23 0143  ALBUMIN 1.5*   PT/INR No results for input(s): "LABPROT", "INR" in the last 72 hours.   Imaging:  VAS [place] ABI WITH/WO TBI  LOWER EXTREMITY DOPPLER STUDY  Patient Name:  Cindy Padilla  Date of Exam:   07/16/2023 Medical Rec #: [id number]              Accession #:    [id number] Date of Birth: [birth year]              Patient Gender: F Patient Age:   48 years Exam Location:  [facility] Procedure:      VAS [place] ABI WITH/WO TBI Referring Phys: ERIC [place]  --------------------------------------------------------------------------------   Indications: Ulceration.  High Risk Factors: Hypertension.   Limitations: Today's exam was limited due to patient positioning, patient              intolerant to cuff pressure, an open wound, bandages and  involuntary patient movement.  Comparison Study: No prior studies.  Performing Technologist: Olen Cordial RVT    Examination Guidelines: A complete evaluation includes at minimum, Doppler waveform signals and systolic blood pressure reading at the level of bilateral brachial, anterior tibial, and posterior tibial arteries, when vessel segments are accessible. Bilateral testing is considered an integral part of a complete examination. Photoelectric Plethysmograph (PPG) waveforms and toe systolic pressure readings are included as required and additional duplex testing as needed. Limited examinations for reoccurring indications may be performed as noted.    ABI Findings: +--------+------------------+-----+-----------+--------+ Right   Rt Pressure (mmHg)IndexWaveform   Comment   +--------+------------------+-----+-----------+--------+ [id number]                    triphasic           +--------+------------------+-----+-----------+--------+ PTA                            multiphasic         +--------+------------------+-----+-----------+--------+ DP                             multiphasic         +--------+------------------+-----+-----------+--------+  +----+------------------+-----+-----------+-------+ LeftLt Pressure (mmHg)IndexWaveform   Comment +----+------------------+-----+-----------+-------+ PTA                        multiphasic        +----+------------------+-----+-----------+-------+ DP                         multiphasic        +----+------------------+-----+-----------+-------+    Summary: Right:  Unable to obtain ABI due to patient pain tolerance, positioning, and constant movement. Waveforms detected in the posterior tibial, and dorsalis pedis arteries are noted to be multiphasic. Unable to obtain TBI due to patient constant movement. Left:  Unable to obtain ABI due to patient pain tolerance, positioning, open wound, bandages, and constant movement. Waveforms detected in the posterior tibial, and dorsalis pedis arteries are noted to be multiphasic. Unable to obtain TBI due to patient constant movement, and great toe anatomy. *See table(s) above for measurements and observations.    Electronically signed by Coral Else MD on 07/16/2023 at 8:13:29 PM.      Final       Scheduled inpatient medications:   amLODipine  5 mg Oral Daily   atorvastatin  20 mg Oral QPM   cefadroxil  500 mg Oral BID   Chlorhexidine Gluconate Cloth  6 each Topical Daily   donepezil  10 mg Oral QHS   fluticasone furoate-vilanterol  1 puff Inhalation Daily   lamoTRIgine  200 mg Oral BID   linezolid  600 mg Oral Q12H   montelukast  10 mg Oral QHS   pantoprazole (PROTONIX) IV  40 mg Intravenous Q12H   QUEtiapine  100 mg  Oral QHS   QUEtiapine  50 mg Oral Daily   sucralfate  1 g Oral Q6H   umeclidinium bromide  1 puff Inhalation Daily   Continuous inpatient infusions:   dextrose 5 % and 0.45 % NaCl 75 mL/hr at 07/22/23 0051   potassium PHOSPHATE IVPB (in mmol)     PRN inpatient medications: acetaminophen **OR** acetaminophen, hydrALAZINE, ipratropium-albuterol, LORazepam, metoprolol tartrate, ondansetron **OR** ondansetron (ZOFRAN) IV, oxyCODONE, senna-docusate, traZODone  Vital signs in last 24 hours: Temp:  [98 F (36.7 C)-99  F (37.2 C)] 99 F (37.2 C) (02/25 0553) Pulse Rate:  [72-88] 72 (02/25 0553) Resp:  [16-18] 18 (02/25 0553) BP: (124-128)/(53-59) 127/53 (02/25 0553) SpO2:  [96 %-100 %] 97 % (02/25 0935) [name] : 07/21/23  Intake/Output Summary (Last 24 hours) at 07/22/2023 1127 Last data filed at 07/22/2023 0600 Gross per 24 hour  Intake 1439.2 ml  Output 300 ml  Net 1139.2 ml    Intake/Output from previous day: 02/24 0701 - 02/25 0700 In: 1559.2 [P.O.:270; I.V.:939.2; IV Piggyback:350] Out: 300 [Urine:300] Intake/Output this shift: No intake/output data recorded.   Physical Exam:  General: Alert female in NAD Heart:  Regular rate and rhythm.  Pulmonary: Normal respiratory effort Abdomen: Soft, nondistended, nontender. Normal bowel sounds. Extremities: No lower extremity edema  Neurologic: Alert and oriented Psych: Pleasant. Cooperative. Insight appears normal.      LOS: 6 days   Willette Cluster ,NP 07/22/2023, 11:27 AM  GI ATTENDING  Interval history and data reviewed.  Patient seen and examined.  Agree with several progress note.  Patient remains stable from a GI standpoint without evidence of bleeding.  No bowel movements.  Stable hemoglobin.  Continue with treatment of acute medical problems.  Continue PPI and Carafate.  Plans for EGD sometime this week after complete Plavix washout.  Will follow.  Wilhemina Bonito. Eda Keys., M.D. [facility] Division of  Gastroenterology

## 2023-07-23 ENCOUNTER — Other Ambulatory Visit: Payer: Self-pay

## 2023-07-23 ENCOUNTER — Encounter (HOSPITAL_COMMUNITY)
Admission: EM | Disposition: A | Discharge status: Nursing Home (Includes ICF) | Payer: Self-pay | Source: Home / Self Care | Attending: Internal Medicine

## 2023-07-23 ENCOUNTER — Inpatient Hospital Stay (HOSPITAL_COMMUNITY): Payer: Medicare Other

## 2023-07-23 DIAGNOSIS — J159 Unspecified bacterial pneumonia: Secondary | ICD-10-CM | POA: Diagnosis not present

## 2023-07-23 DIAGNOSIS — Z7902 Long term (current) use of antithrombotics/antiplatelets: Secondary | ICD-10-CM | POA: Diagnosis not present

## 2023-07-23 DIAGNOSIS — D62 Acute posthemorrhagic anemia: Secondary | ICD-10-CM | POA: Diagnosis not present

## 2023-07-23 LAB — BASIC METABOLIC PANEL
Anion gap: 7 (ref 5–15)
BUN: 13 mg/dL (ref 8–23)
CO2: 22 mmol/L (ref 22–32)
Calcium: 7.6 mg/dL — ABNORMAL LOW (ref 8.9–10.3)
Chloride: 114 mmol/L — ABNORMAL HIGH (ref 98–111)
Creatinine, Ser: 0.69 mg/dL (ref 0.44–1.00)
GFR, Estimated: >60 mL/min (ref >60)
Glucose, Bld: 89 mg/dL (ref 70–99)
Potassium: 4 mmol/L (ref 3.5–5.1)
Sodium: 143 mmol/L (ref 135–145)

## 2023-07-23 LAB — CBC
HCT: 25.1 % — ABNORMAL LOW (ref 36.0–46.0)
Hemoglobin: 7.6 g/dL — ABNORMAL LOW (ref 12.0–15.0)
MCH: 30.4 pg (ref 26.0–34.0)
MCHC: 30.3 g/dL (ref 30.0–36.0)
MCV: 100.4 fL — ABNORMAL HIGH (ref 80.0–100.0)
Platelets: 213 10*3/uL (ref 150–400)
RBC: 2.5 MIL/uL — ABNORMAL LOW (ref 3.87–5.11)
RDW: 19.9 % — ABNORMAL HIGH (ref 11.5–15.5)
WBC: 5.3 10*3/uL (ref 4.0–10.5)
nRBC: 0 % (ref 0.0–0.2)

## 2023-07-23 LAB — MAGNESIUM: Magnesium: 2.1 mg/dL (ref 1.7–2.4)

## 2023-07-23 LAB — BRAIN NATRIURETIC PEPTIDE: B Natriuretic Peptide: 282.2 pg/mL — ABNORMAL HIGH (ref 0.0–100.0)

## 2023-07-23 LAB — GLUCOSE, CAPILLARY
Glucose-Capillary: 104 mg/dL — ABNORMAL HIGH (ref 70–99)
Glucose-Capillary: 113 mg/dL — ABNORMAL HIGH (ref 70–99)
Glucose-Capillary: 81 mg/dL (ref 70–99)
Glucose-Capillary: 86 mg/dL (ref 70–99)
Glucose-Capillary: 87 mg/dL (ref 70–99)
Glucose-Capillary: 99 mg/dL (ref 70–99)
Glucose-Capillary: 99 mg/dL (ref 70–99)

## 2023-07-23 SURGERY — ESOPHAGOGASTRODUODENOSCOPY (EGD) WITH PROPOFOL
Anesthesia: Monitor Anesthesia Care

## 2023-07-23 MED ORDER — LAMOTRIGINE 25 MG PO TABS
150.0000 mg | ORAL_TABLET | Freq: Two times a day (BID) | ORAL | Status: DC
  Administered 2023-07-23: 150 mg via ORAL
  Filled 2023-07-23 (×2): qty 2

## 2023-07-23 MED ORDER — SODIUM CHLORIDE 0.9% FLUSH: 10.0000 mL | INTRAVENOUS | Status: DC | PRN

## 2023-07-23 MED ORDER — SODIUM CHLORIDE 0.9% FLUSH
10.0000 mL | Freq: Two times a day (BID) | INTRAVENOUS | Status: DC
  Administered 2023-07-25 – 2023-07-28 (×2): 10 mL

## 2023-07-23 MED ORDER — FUROSEMIDE 10 MG/ML IJ SOLN: 20.0000 mg | Freq: Once | INTRAMUSCULAR | Status: DC

## 2023-07-23 MED ORDER — DEXTROSE-SODIUM CHLORIDE 5-0.45 % IV SOLN: INTRAVENOUS | Status: AC

## 2023-07-23 NOTE — Progress Notes (Signed)
 Patient is not following verbal commands when attempting to feed her. Noticed that when she does open her mouth to eat that she is pocketing food in cheeks rather than swallowing. Brushed teeth and suctioned mouth.

## 2023-07-23 NOTE — Progress Notes (Addendum)
 Daily Progress Note  DOA: 07/15/2023 Hospital Day: 9   Chief Complaint:   ASSESSMENT    Brief Narrative:  Cindy Padilla is a 83 y.o. year old female with a history of    dementia, HTN, CAD on plavix, COPD, critical aortic stenosis, large hiatal hernia. Admitted 2/18 with sepsis / CAP / acute encephalopathy, UTI / lower extremity cellulitis. Developed dark stool and decline in hgb. GI saw in consult 2/23   GI bleed, presumably upper with melena. Resolved Plavix + Lovenox still on hold.  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 Last plavix dose was 2/21 Today:  Spoke with RN. Patient had a small black BM this am.     Acute blood loss anemia Hgb 11.1 >> 7.2 range this admission Today:  Hgb remains overall stable, 7.6 today  Severe sepsis  ( CAP, UTI, cellulitis) and / acute encephalopathy ( poa) Recent Influenza A, E.coli UTI, CAP / BLE cellulitis. On Rocephin and Zyvox  Today:  WBC normal. Tmax 99. She is less responsive compared to yesterday. Mumbles when asked questions. Chest sounds congested but sats 97 % on 2 L 02. Has some left sided anasarca.  Based on I+0 she is positive almost 5 liters. Not having much urine output.  I don't think she is ready for an EGD right now. May need CXR. Overall doesn't seems to be making a lot of progress.  Additionally it appears her PO intake is poor  Dementia.  Baseline unknown   Severe aortic stenosis   Principal Problem:   Community acquired bacterial pneumonia Active Problems:   Sepsis (HCC)   Acute blood loss anemia   Platelet inhibition due to Plavix   PLAN   --Not ready for an EGD today. --Need CXR? Will discuss with TRH.  --Continue to monitor H/H --Continue BID PPI and Carafate   Subjective   No complaints but not very alert   Objective    Recent Labs    07/22/23 0317 07/23/23 0323  WBC 4.2 5.3  HGB 7.9* 7.6*  HCT 26.9* 25.1*  PLT 222 213   BMET Recent Labs    07/22/23 0317  07/23/23 0323  NA 142 143  K 3.6 4.0  CL 113* 114*  CO2 23 22  GLUCOSE 122* 89  BUN 12 13  CREATININE 0.69 0.69  CALCIUM 7.9* 7.6*   LFT No results for input(s): "PROT", "ALBUMIN", "AST", "ALT", "ALKPHOS", "BILITOT", "BILIDIR", "IBILI" in the last 72 hours. PT/INR No results for input(s): "LABPROT", "INR" in the last 72 hours.   Imaging:  VAS [place] ABI WITH/WO TBI  LOWER EXTREMITY DOPPLER STUDY  Patient Name:  Cindy Padilla  Date of Exam:   07/16/2023 Medical Rec #: [id number]              Accession #:    [id number] Date of Birth: May 19, 1941              Patient Gender: F Patient Age:   24 years Exam Location:  [facility] Procedure:      VAS [place] ABI WITH/WO TBI Referring Phys: ERIC [place]  --------------------------------------------------------------------------------   Indications: Ulceration.  High Risk Factors: Hypertension.   Limitations: Today's exam was limited due to patient positioning, patient              intolerant to cuff pressure, an open wound, bandages and              involuntary patient movement.  Comparison Study: No prior studies.  Performing Technologist: Olen Cordial RVT    Examination Guidelines: A complete evaluation includes at minimum, Doppler waveform signals and systolic blood pressure reading at the level of bilateral brachial, anterior tibial, and posterior tibial arteries, when vessel segments are accessible. Bilateral testing is considered an integral part of a complete examination. Photoelectric Plethysmograph (PPG) waveforms and toe systolic pressure readings are included as required and additional duplex testing as needed. Limited examinations for reoccurring indications may be performed as noted.    ABI Findings: +--------+------------------+-----+-----------+--------+ Right   Rt Pressure (mmHg)IndexWaveform   Comment  +--------+------------------+-----+-----------+--------+ [id number]                     triphasic           +--------+------------------+-----+-----------+--------+ PTA                            multiphasic         +--------+------------------+-----+-----------+--------+ DP                             multiphasic         +--------+------------------+-----+-----------+--------+  +----+------------------+-----+-----------+-------+ LeftLt Pressure (mmHg)IndexWaveform   Comment +----+------------------+-----+-----------+-------+ PTA                        multiphasic        +----+------------------+-----+-----------+-------+ DP                         multiphasic        +----+------------------+-----+-----------+-------+    Summary: Right:  Unable to obtain ABI due to patient pain tolerance, positioning, and constant movement. Waveforms detected in the posterior tibial, and dorsalis pedis arteries are noted to be multiphasic. Unable to obtain TBI due to patient constant movement. Left:  Unable to obtain ABI due to patient pain tolerance, positioning, open wound, bandages, and constant movement. Waveforms detected in the posterior tibial, and dorsalis pedis arteries are noted to be multiphasic. Unable to obtain TBI due to patient constant movement, and great toe anatomy. *See table(s) above for measurements and observations.    Electronically signed by Coral Else MD on 07/16/2023 at 8:13:29 PM.      Final       Scheduled inpatient medications:   amLODipine  5 mg Oral Daily   atorvastatin  20 mg Oral QPM   Chlorhexidine Gluconate Cloth  6 each Topical Daily   donepezil  10 mg Oral QHS   fluticasone furoate-vilanterol  1 puff Inhalation Daily   lamoTRIgine  200 mg Oral BID   montelukast  10 mg Oral QHS   pantoprazole (PROTONIX) IV  40 mg Intravenous Q12H   QUEtiapine  100 mg Oral QHS   QUEtiapine  50 mg Oral Daily   sucralfate  1 g Oral Q6H   umeclidinium bromide  1 puff Inhalation Daily   Continuous inpatient infusions:    cefTRIAXone (ROCEPHIN)  IV 2 g (07/22/23 1440)   dextrose 5 % and 0.45 % NaCl 50 mL/hr at 07/23/23 0943   linezolid (ZYVOX) IV 600 mg (07/23/23 0944)   PRN inpatient medications: acetaminophen **OR** acetaminophen, hydrALAZINE, ipratropium-albuterol, LORazepam, metoprolol tartrate, ondansetron **OR** ondansetron (ZOFRAN) IV, oxyCODONE, senna-docusate, traZODone  Vital signs in last 24 hours: Temp:  [98.5 F (36.9 C)-99 F (37.2 C)] 99 F (37.2 C) (02/26 0445) Pulse  Rate:  [73-87] 87 (02/26 0445) Resp:  [16-18] 16 (02/26 0445) BP: (129-137)/(59-68) 129/59 (02/26 0445) SpO2:  [97 %-100 %] 97 % (02/26 0839) [name] : 07/23/23  Intake/Output Summary (Last 24 hours) at 07/23/2023 1038 Last data filed at 07/23/2023 0800 Gross per 24 hour  Intake 1204.67 ml  Output 275 ml  Net 929.67 ml    Intake/Output from previous day: 02/25 0701 - 02/26 0700 In: 1204.7 [P.O.:240; I.V.:412.5; IV Piggyback:552.2] Out: 275 [Urine:275] Intake/Output this shift: No intake/output data recorded.   Physical Exam:  General: Sleepy, confused female Heart:  Regular rate  Pulmonary: Normal respiratory effort on 02 per Leslie. Chest congested. No wheezing. No one in room to help lean forward for auscultation Abdomen: Soft, nondistended, nontender. Normal bowel sounds.    LOS: 7 days   Willette Cluster ,NP 07/23/2023, 10:38 AM  GI ATTENDING  Interval history data reviewed.  Agree with interval progress note as outlined above.  Patient stable from GI perspective without evidence of GI bleeding.  Stable hemoglobin.  Agree that she is not ready for endoscopy as outlined above.  Continue other supportive measures.  We will reassess tomorrow  Wilhemina Bonito. Eda Keys., M.D. [facility] Division of Gastroenterology
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
## 2023-07-23 NOTE — Plan of Care (Signed)
   Problem: Clinical Measurements: Goal: Ability to maintain clinical measurements within normal limits will improve Outcome: Progressing Goal: Will remain free from infection Outcome: Progressing

## 2023-07-23 NOTE — Progress Notes (Signed)
 Peripherally Inserted Central Catheter Placement  The IV Nurse has discussed with the patient and/or persons authorized to consent for the patient, the purpose of this procedure and the potential benefits and risks involved with this procedure.  The benefits include less needle sticks, lab draws from the catheter, and the patient may be discharged home with the catheter. Risks include, but not limited to, infection, bleeding, blood clot (thrombus formation), and puncture of an artery; nerve damage and irregular heartbeat and possibility to perform a PICC exchange if needed/ordered by physician.  Alternatives to this procedure were also discussed.  Bard Power PICC patient education guide, fact sheet on infection prevention and patient information card has been provided to patient /or left at bedside.    Consent obtained via phone with daughter  PICC Placement Documentation  PICC Double Lumen 07/23/23 Right Brachial 32 cm 0 cm (Active)  Indication for Insertion or Continuance of Line Poor Vasculature-patient has had multiple peripheral attempts or PIVs lasting less than 24 hours 07/23/23 1800  Exposed Catheter (cm) 0 cm 07/23/23 1800  Site Assessment Clean, Dry, Intact 07/23/23 1800  Lumen #1 Status Flushed;Saline locked;Blood return noted 07/23/23 1800  Lumen #2 Status Flushed;Saline locked;Blood return noted 07/23/23 1800  Dressing Type Transparent;Securing device 07/23/23 1800  Dressing Status Antimicrobial disc/dressing in place;Clean, Dry, Intact 07/23/23 1800  Line Care Connections checked and tightened 07/23/23 1800  Line Adjustment (NICU/IV Team Only) No 07/23/23 1800  Dressing Intervention New dressing 07/23/23 1800  Dressing Change Due 07/30/23 07/23/23 1800       Timmothy Sours 07/23/2023, 6:44 PM

## 2023-07-23 NOTE — Progress Notes (Signed)
 Patient continues to be drowsy.Due to patients fatigue and inability to follow commands I was unable to feed her and give PO Carafate.

## 2023-07-23 NOTE — Progress Notes (Signed)
 Consulted for PIV; assessed both arms with ultrasound ;no suitable veins for PIV/midline found, very poor veins ; both arms edematous.Primary rn aware.

## 2023-07-24 DIAGNOSIS — J159 Unspecified bacterial pneumonia: Secondary | ICD-10-CM | POA: Diagnosis not present

## 2023-07-24 DIAGNOSIS — Z7902 Long term (current) use of antithrombotics/antiplatelets: Secondary | ICD-10-CM | POA: Diagnosis not present

## 2023-07-24 DIAGNOSIS — D62 Acute posthemorrhagic anemia: Secondary | ICD-10-CM | POA: Diagnosis not present

## 2023-07-24 LAB — GLUCOSE, CAPILLARY
Glucose-Capillary: 95 mg/dL (ref 70–99)
Glucose-Capillary: 82 mg/dL (ref 70–99)
Glucose-Capillary: 109 mg/dL — ABNORMAL HIGH (ref 70–99)
Glucose-Capillary: 101 mg/dL — ABNORMAL HIGH (ref 70–99)
Glucose-Capillary: 112 mg/dL — ABNORMAL HIGH (ref 70–99)

## 2023-07-24 LAB — BASIC METABOLIC PANEL
Anion gap: 6 (ref 5–15)
BUN: 14 mg/dL (ref 8–23)
CO2: 21 mmol/L — ABNORMAL LOW (ref 22–32)
Calcium: 7.4 mg/dL — ABNORMAL LOW (ref 8.9–10.3)
Chloride: 110 mmol/L (ref 98–111)
Creatinine, Ser: 0.89 mg/dL (ref 0.44–1.00)
GFR, Estimated: >60 mL/min (ref >60)
Glucose, Bld: 109 mg/dL — ABNORMAL HIGH (ref 70–99)
Potassium: 3.4 mmol/L — ABNORMAL LOW (ref 3.5–5.1)
Sodium: 137 mmol/L (ref 135–145)

## 2023-07-24 LAB — CBC
HCT: 24.7 % — ABNORMAL LOW (ref 36.0–46.0)
Hemoglobin: 7.4 g/dL — ABNORMAL LOW (ref 12.0–15.0)
MCH: 30.3 pg (ref 26.0–34.0)
MCHC: 30 g/dL (ref 30.0–36.0)
MCV: 101.2 fL — ABNORMAL HIGH (ref 80.0–100.0)
Platelets: 188 10*3/uL (ref 150–400)
RBC: 2.44 MIL/uL — ABNORMAL LOW (ref 3.87–5.11)
RDW: 19.5 % — ABNORMAL HIGH (ref 11.5–15.5)
WBC: 4.3 10*3/uL (ref 4.0–10.5)
nRBC: 0 % (ref 0.0–0.2)

## 2023-07-24 LAB — MAGNESIUM: Magnesium: 2.1 mg/dL (ref 1.7–2.4)

## 2023-07-24 MED ORDER — POTASSIUM CHLORIDE 20 MEQ PO PACK
40.0000 meq | PACK | Freq: Once | ORAL | Status: DC
  Filled 2023-07-24: qty 2

## 2023-07-24 MED ORDER — DEXTROSE-SODIUM CHLORIDE 5-0.45 % IV SOLN: INTRAVENOUS | Status: AC

## 2023-07-24 MED ORDER — LAMOTRIGINE 100 MG PO TABS
100.0000 mg | ORAL_TABLET | Freq: Two times a day (BID) | ORAL | Status: DC
  Administered 2023-07-24 – 2023-07-28 (×9): 100 mg via ORAL
  Filled 2023-07-24 (×10): qty 1

## 2023-07-24 MED ORDER — QUETIAPINE FUMARATE 50 MG PO TABS
50.0000 mg | ORAL_TABLET | Freq: Every day | ORAL | Status: DC
  Administered 2023-07-24 – 2023-07-28 (×5): 50 mg via ORAL
  Filled 2023-07-24 (×5): qty 1

## 2023-07-24 NOTE — Progress Notes (Signed)
 PROGRESS NOTE    Cindy Padilla  WJX:[id number] DOB: [birth year] DOA: 07/15/2023 PCP: Valere Dross, FNP    Brief Narrative:   83 year old female, with past medical history significant for dementia, HTN, HLD, CAD, COPD, mood disorder who presented to [facility] ED on 2/18 via EMS from [facility] memory care facility with confusion, progressive decline in activity over the preceding week.  Recently diagnosed with ESBL UTI and influenza.  Now admitted for failure to thrive causing electrolyte imbalance, encephalopathy and Hemoccult stool.  [facility] consulted, planning EGD 2/26.   Assessment & Plan:  Principal Problem:   Community acquired bacterial pneumonia Active Problems:   Sepsis (HCC)    Acute metabolic encephalopathy:  Overall improving.  Likely from multiple underlying ongoing issue including infection from ESBL UTI BS ok, Ammonia is normal.  CT head showed chronic changes.  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     Severe sepsis due to ESBL UTI, POA Sepsis physiology is improving.  Urine cultures from 2/18 reviewed.  Showing ESBL -Completed 5 days of ertapenem 2/18 - 2/22  Acute hypoxia; 2-3 L [place] - BNP slightly elevated, received Lasix.  Chest x-ray improved.  Breathing has improved as well, will attempt to wean this off.   Anemia, likely acute blood loss anemia Macrocytosis Baseline hemoglobin 11, admission hemoglobin 7.8 with positive Hemoccult stool.  Currently on PPI twice daily and Carafate - EGD 2021 showed esophagitis and Sheria Lang lesion - [facility] planning EGD (last dose of Plavix 2/21)   Bilateral lower extremity cellulitis Left lower extremity necrotic wound -Left shin wounds from wheelchair incident per family as another residents of the facility ran into her leg a few weeks ago. -Due to ongoing concerns of this currently on IV Linezolid and Rocephin  (total 14 days) -Seen by wound care  team   Acute renal failure: Resolved -Baseline creatinine 0.6, admission creatinine 1.6   Dysphagia -Continue dysphagia 2 diet   Recent influenza A viral infection Originally diagnosed with influenza A on 07/04/2023; completed course of Tamiflu at facility.   Hyperkalemia: Resolved        Hypokalemia/hypophosphatemia -As needed repletion   Acute urinary retention Foley catheter was placed in the ED for acute urinary retention. -will attempt foley removal prior to dc.    Pleural effusion Severe aortic stenosis Reviewed TTE from December 2024 with preserved LVEF, grade 1 diastolic dysfunction, large pleural effusion and severe aortic stenosis.  Patient is asymptomatic, no chest pain, no tamponade physiology.  Has outpatient appointment coming up with cardiology, Dr. Odis Hollingshead on 08/11/2023   Essential hypertension -Amlodipine 5 mg p.o. daily. Iv prn   Hyperlipidemia -Atorvastatin 20mg  p.o. daily   CAD Lipitor continue atorvastatin 20 mg p.o. daily -Plavix on hold   COPD:  -Bronchodilators   Dementia Mood disorder Adult failure to thrive -Cont bedtime Seroquel, dc am Seroquel. Lamictal reduced 150mg  bid (used to be on 200mg )= bid)   Weakness/deconditioning/gait disturbance/debility: Patient residing at [facility] memory care facility.  Most recent baseline able to ambulate with the use of a walker but since has been utilizing a wheelchair due to acute decline with recent diagnosis of influenza A on 2/7. -PT/OT evaluation  PICC placed on 2/26 due to lack of IV Access.      DVT prophylaxis: SCDs Start: 07/15/23 2126  DNR/DNI Family Communication: Clide Cliff Continue hospital stay for at least next 48 hours. Still pending EGD eval per  GI.   Subjective: Tarry stool Confused.  Very poor oral intake.  Examination:  General exam: Appears calm and comfortable, cachectic frail Respiratory system: Clear to auscultation. Respiratory effort normal. Cardiovascular  system: S1 & S2 heard, RRR. No JVD, murmurs, rubs, gallops or clicks. No pedal edema. Gastrointestinal system: Abdomen is nondistended, soft and nontender. No organomegaly or masses felt. Normal bowel sounds heard. Central nervous system: Alert and oriented. No focal neurological deficits. Extremities: Symmetric 4 x 5 power. Skin: No rashes, lesions or ulcers Psychiatry: Judgement and insight appear poor foley           Pressure Injury Sacrum Right;Left;Mid Stage 1 -  Intact skin with non-blanchable redness of a localized area usually over a bony prominence. (Active)     Location: Sacrum  Location Orientation: Right;Left;Mid  Staging: Stage 1 -  Intact skin with non-blanchable redness of a localized area usually over a bony prominence.  Wound Description (Comments):   Present on Admission:      Diet Orders (From admission, onward)     Start     Ordered   07/23/23 0917  DIET DYS 2 Room service appropriate? Yes; Fluid consistency: Thin  Diet effective now       Question Answer Comment  Room service appropriate? Yes   Fluid consistency: Thin      07/23/23 0916            Objective: Vitals:   07/23/23 1352 07/23/23 2118 07/24/23 0510 07/24/23 0812  BP: 127/62 (!) 120/51 121/73 128/83  Pulse: 77 76 79 70  Resp: 17 17 17 17   Temp: 97.7 F (36.5 C) 98.5 F (36.9 C) 98.3 F (36.8 C) 97.8 F (36.6 C)  TempSrc:  Oral Oral   SpO2: 100% 100% 100% 100%  Weight:      Height:        Intake/Output Summary (Last 24 hours) at 07/24/2023 1143 Last data filed at 07/24/2023 1100 Gross per 24 hour  Intake 1680.17 ml  Output 475 ml  Net 1205.17 ml   Filed Weights   07/16/23 0000 07/16/23 0330  Weight: 70.3 kg 63.3 kg    Scheduled Meds:  amLODipine  5 mg Oral Daily   atorvastatin  20 mg Oral QPM   Chlorhexidine Gluconate Cloth  6 each Topical Daily   donepezil  10 mg Oral QHS   fluticasone furoate-vilanterol  1 puff Inhalation Daily   furosemide  20 mg Intravenous  Once   lamoTRIgine  100 mg Oral BID   montelukast  10 mg Oral QHS   pantoprazole (PROTONIX) IV  40 mg Intravenous Q12H   potassium chloride  40 mEq Oral Once   QUEtiapine  50 mg Oral QHS   sodium chloride flush  10-40 mL Intracatheter Q12H   sucralfate  1 g Oral Q6H   umeclidinium bromide  1 puff Inhalation Daily   Continuous Infusions:  cefTRIAXone (ROCEPHIN)  IV 2 g (07/22/23 1440)   linezolid (ZYVOX) IV 600 mg (07/24/23 0859)    Nutritional status     Body mass index is 27.25 kg/m.  Data Reviewed:   CBC: Recent Labs  Lab 07/18/23 0331 07/19/23 1330 07/20/23 0143 07/20/23 0721 07/22/23 0317 07/23/23 0323 07/24/23 0329  WBC 5.8  --   --   --  4.2 5.3 4.3  HGB 7.8*   < > 7.2* 7.9* 7.9* 7.6* 7.4*  HCT 26.0*   < > 23.9* 26.5* 26.9* 25.1* 24.7*  MCV 102.0*  --   --   --  101.9* 100.4* 101.2*  PLT 294  --   --   --  222 213 188   < > = values in this interval not displayed.   Basic Metabolic Panel: Recent Labs  Lab 07/18/23 0331 07/18/23 1537 07/19/23 0343 07/20/23 0143 07/22/23 0317 07/23/23 0323 07/24/23 0329  NA 140 140 139 142 142 143 137  K 2.4* 3.9 4.0 3.8 3.6 4.0 3.4*  CL 105 105 108 113* 113* 114* 110  CO2 23 26 22 25 23 22  21*  GLUCOSE 61* 106* 79 89 122* 89 109*  BUN 9 10 11 11 12 13 14   CREATININE 0.39* 0.54 0.57 0.64 0.69 0.69 0.89  CALCIUM 7.9* 8.0* 7.6* 7.7* 7.9* 7.6* 7.4*  MG 2.1  2.1  --  2.0 2.1 2.1 2.1 2.1  PHOS 1.7* 3.3 2.8 2.5 2.3*  --   --    GFR: Estimated Creatinine Clearance: 39.8 mL/min (by C-G formula based on SCr of 0.89 mg/dL). Liver Function Tests: Recent Labs  Lab 07/18/23 1537 07/19/23 0343 07/20/23 0143  ALBUMIN 2.1* 1.6* 1.5*   No results for input(s): "LIPASE", "AMYLASE" in the last 168 hours. Recent Labs  Lab 07/21/23 1050  AMMONIA 22   Coagulation Profile: No results for input(s): "INR", "PROTIME" in the last 168 hours. Cardiac Enzymes: No results for input(s): "CKTOTAL", "CKMB", "CKMBINDEX", "TROPONINI"  in the last 168 hours. BNP (last 3 results) No results for input(s): "PROBNP" in the last 8760 hours. HbA1C: No results for input(s): "HGBA1C" in the last 72 hours. CBG: Recent Labs  Lab 07/23/23 1639 07/23/23 2105 07/23/23 2348 07/24/23 0441 07/24/23 0807  GLUCAP 104* 86 81 95 82   Lipid Profile: No results for input(s): "CHOL", "HDL", "LDLCALC", "TRIG", "CHOLHDL", "LDLDIRECT" in the last 72 hours. Thyroid Function Tests: No results for input(s): "TSH", "T4TOTAL", "FREET4", "T3FREE", "THYROIDAB" in the last 72 hours. Anemia Panel: No results for input(s): "VITAMINB12", "FOLATE", "FERRITIN", "TIBC", "IRON", "RETICCTPCT" in the last 72 hours. Sepsis Labs: Recent Labs  Lab 07/18/23 0331  LATICACIDVEN 1.5    Recent Results (from the past 240 hours)  Resp panel by RT-PCR (RSV, Flu A&B, Covid) Anterior Nasal Swab     Status: Abnormal   Collection Time: 07/15/23  1:55 PM   Specimen: Anterior Nasal Swab  Result Value Ref Range Status   SARS Coronavirus 2 by RT PCR NEGATIVE NEGATIVE Final    Comment: (NOTE) SARS-CoV-2 target nucleic acids are NOT DETECTED.  The SARS-CoV-2 RNA is generally detectable in upper respiratory specimens during the acute phase of infection. The lowest concentration of SARS-CoV-2 viral copies this assay can detect is 138 copies/mL. A negative result does not preclude SARS-Cov-2 infection and should not be used as the sole basis for treatment or other patient management decisions. A negative result may occur with  improper specimen collection/handling, submission of specimen other than nasopharyngeal swab, presence of viral mutation(s) within the areas targeted by this assay, and inadequate number of viral copies(<138 copies/mL). A negative result must be combined with clinical observations, patient history, and epidemiological information. The expected result is Negative.  Fact Sheet for Patients:  BloggerCourse.com  Fact  Sheet for Healthcare Providers:  SeriousBroker.it  This test is no t yet approved or cleared by the [place] FDA and  has been authorized for detection and/or diagnosis of SARS-CoV-2 by FDA under an Emergency Use Authorization (EUA). This EUA will remain  in effect (meaning this test can be used) for the duration of the COVID-19 declaration under  Section 564(b)(1) of the Act, 21 U.S.C.section 360bbb-3(b)(1), unless the authorization is terminated  or revoked sooner.       Influenza A by PCR POSITIVE (A) NEGATIVE Final   Influenza B by PCR NEGATIVE NEGATIVE Final    Comment: (NOTE) The Xpert Xpress SARS-CoV-2/FLU/RSV plus assay is intended as an aid in the diagnosis of influenza from Nasopharyngeal swab specimens and should not be used as a sole basis for treatment. Nasal washings and aspirates are unacceptable for Xpert Xpress SARS-CoV-2/FLU/RSV testing.  Fact Sheet for Patients: BloggerCourse.com  Fact Sheet for Healthcare Providers: SeriousBroker.it  This test is not yet approved or cleared by the [place] FDA and has been authorized for detection and/or diagnosis of SARS-CoV-2 by FDA under an Emergency Use Authorization (EUA). This EUA will remain in effect (meaning this test can be used) for the duration of the COVID-19 declaration under Section 564(b)(1) of the Act, 21 U.S.C. section 360bbb-3(b)(1), unless the authorization is terminated or revoked.     Resp Syncytial Virus by PCR NEGATIVE NEGATIVE Final    Comment: (NOTE) Fact Sheet for Patients: BloggerCourse.com  Fact Sheet for Healthcare Providers: SeriousBroker.it  This test is not yet approved or cleared by the [place] FDA and has been authorized for detection and/or diagnosis of SARS-CoV-2 by FDA under an Emergency Use Authorization (EUA). This EUA will remain in effect  (meaning this test can be used) for the duration of the COVID-19 declaration under Section 564(b)(1) of the Act, 21 U.S.C. section 360bbb-3(b)(1), unless the authorization is terminated or revoked.  Performed at [facility], [address] [place], [place] [address]   Urine Culture     Status: Abnormal   Collection Time: 07/15/23  8:02 PM   Specimen: Urine, Catheterized  Result Value Ref Range Status   Specimen Description   Final    URINE, CATHETERIZED Performed at [facility], [address] [place], [place] [address]    Special Requests   Final    NONE Performed at [facility], [address] [place], [place] [address]    Culture >=100,000 COLONIES/mL ESCHERICHIA COLI (A)  Final   Report Status 07/17/2023 FINAL  Final   Organism ID, Bacteria ESCHERICHIA COLI (A)  Final      Susceptibility   Escherichia coli - MIC*    AMPICILLIN >=32 RESISTANT Resistant     CEFAZOLIN >=64 RESISTANT Resistant     CEFEPIME 2 SENSITIVE Sensitive     CEFTRIAXONE 32 RESISTANT Resistant     CIPROFLOXACIN >=4 RESISTANT Resistant     GENTAMICIN <=1 SENSITIVE Sensitive     IMIPENEM <=0.25 SENSITIVE Sensitive     NITROFURANTOIN <=16 SENSITIVE Sensitive     TRIMETH/SULFA >=320 RESISTANT Resistant     AMPICILLIN/SULBACTAM 4 SENSITIVE Sensitive     PIP/TAZO <=4 SENSITIVE Sensitive ug/mL    * >=100,000 COLONIES/mL ESCHERICHIA COLI  MRSA Next Gen by PCR, Nasal     Status: None   Collection Time: 07/16/23  6:26 AM   Specimen: Nasal Mucosa; Nasal Swab  Result Value Ref Range Status   MRSA by PCR Next Gen NOT DETECTED NOT DETECTED Final    Comment: (NOTE) The GeneXpert MRSA Assay (FDA approved for NASAL specimens only), is one component of a comprehensive MRSA colonization surveillance program. It is not intended to diagnose MRSA infection nor to guide or monitor treatment for MRSA infections. Test performance is not FDA approved in  patients less than 84 years old. Performed at  [facility], [address] [place], [place] [address]   Culture, blood (Routine X 2) w Reflex to ID Panel     Status: None   Collection Time: 07/16/23  8:53 AM   Specimen: BLOOD LEFT HAND  Result Value Ref Range Status   Specimen Description   Final    BLOOD LEFT HAND Performed at [facility], [address], [place], [place] [address]    Special Requests   Final    BOTTLES DRAWN AEROBIC ONLY Blood Culture results may not be optimal due to an inadequate volume of blood received in culture bottles Performed at [facility], [address], [place], [place] [address]    Culture   Final    NO GROWTH 5 DAYS Performed at [facility], [address], [place], [place] [address]    Report Status 07/21/2023 FINAL  Final  Culture, blood (Routine X 2) w Reflex to ID Panel     Status: None   Collection Time: 07/16/23  8:53 AM   Specimen: BLOOD LEFT FOREARM  Result Value Ref Range Status   Specimen Description   Final    BLOOD LEFT FOREARM Performed at [facility], [address] [place], [place] [address]    Special Requests   Final    BOTTLES DRAWN AEROBIC ONLY Blood Culture results may not be optimal due to an inadequate volume of blood received in culture bottles Performed at [facility], [address], [place], [place] [address]    Culture   Final    NO GROWTH 5 DAYS Performed at [facility], [address], [place], [place] [address]    Report Status 07/21/2023 FINAL  Final         Radiology Studies: DG Chest Port 1 View Result Date: 07/23/2023 CLINICAL DATA:  Dyspnea EXAM: PORTABLE CHEST 1 VIEW COMPARISON:  Chest x-ray 07/15/2023 FINDINGS: Generator overlies the left chest, unchanged. The heart size and mediastinal contours are within normal limits. Both lungs are clear. The visualized skeletal structures are unremarkable. IMPRESSION: No  active disease. Electronically Signed   By: Darliss Cheney M.D.   On: 07/23/2023 18:12   [place] EKG SITE RITE Result Date: 07/23/2023 If [facility] image not attached, placement could not be confirmed due to current cardiac rhythm.          LOS: 8 days   Time spent= 35 mins    Miguel Rota, MD [facility]  If 7PM-7AM, please contact night-coverage  07/24/2023, 11:43 AM

## 2023-07-24 NOTE — H&P (View-Only) (Signed)
 Daily Progress Note  DOA: 07/15/2023 Hospital Day: 10   Chief Complaint:  melena  ASSESSMENT    Brief Narrative:  Cindy Padilla is a 83 y.o. year old female with a history of  dementia, HTN, CAD on plavix, COPD, critical aortic stenosis, large hiatal hernia. Admitted 2/18 with sepsis / CAP / acute encephalopathy, UTI / lower extremity cellulitis. Developed dark stool and decline in hgb. GI saw in consult 2/23   GI bleed, presumably upper with melena.  Plavix + Lovenox still on hold.  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 Last plavix dose was 2/21 Today:  Not actively bleeding.   Acute blood loss anemia / macrocytosis Hgb low but overall stable 7.2 to 7.9 over last few days.    Severe sepsis  ( CAP, UTI, cellulitis) and / acute encephalopathy ( poa) Recent Influenza A, E.coli UTI, CAP / BLE cellulitis. On Rocephin and Zyvox  Today:  Much more alert and responsive today, possibly due to discontinuation of seroquel and lamictal yesterday. Afebrile, normal WBC. Negative CXR yesterday.    Dementia.  Baseline unknown. Oriented to place but confused otherwise    Severe aortic stenosis  Principal Problem:   Community acquired bacterial pneumonia Active Problems:   Sepsis (HCC)   Acute blood loss anemia   Platelet inhibition due to Plavix   PLAN   --Eventual EGD, timing to be announced. Dr. Chales Abrahams previously discussed with daughter.  --Continue BID Pantoprazole  Subjective   Says she cannot urinate - has foley   Objective    Recent Labs    07/22/23 0317 07/23/23 0323 07/24/23 0329  WBC 4.2 5.3 4.3  HGB 7.9* 7.6* 7.4*  HCT 26.9* 25.1* 24.7*  PLT 222 213 188   BMET Recent Labs    07/22/23 0317 07/23/23 0323 07/24/23 0329  NA 142 143 137  K 3.6 4.0 3.4*  CL 113* 114* 110  CO2 23 22 21*  GLUCOSE 122* 89 109*  BUN 12 13 14   CREATININE 0.69 0.69 0.89  CALCIUM 7.9* 7.6* 7.4*   LFT No results for input(s): "PROT", "ALBUMIN",  "AST", "ALT", "ALKPHOS", "BILITOT", "BILIDIR", "IBILI" in the last 72 hours. PT/INR No results for input(s): "LABPROT", "INR" in the last 72 hours.   Imaging:  DG Chest Port 1 View CLINICAL DATA:  Dyspnea  EXAM: PORTABLE CHEST 1 VIEW  COMPARISON:  Chest x-ray 07/15/2023  FINDINGS: Generator overlies the left chest, unchanged. The heart size and mediastinal contours are within normal limits. Both lungs are clear. The visualized skeletal structures are unremarkable.  IMPRESSION: No active disease.  Electronically Signed   By: Darliss Cheney M.D.   On: 07/23/2023 18:12 [place] EKG SITE RITE If Site Rite image not attached, placement could not be confirmed due to  current cardiac rhythm.     Scheduled inpatient medications:   amLODipine  5 mg Oral Daily   atorvastatin  20 mg Oral QPM   Chlorhexidine Gluconate Cloth  6 each Topical Daily   donepezil  10 mg Oral QHS   fluticasone furoate-vilanterol  1 puff Inhalation Daily   lamoTRIgine  100 mg Oral BID   montelukast  10 mg Oral QHS   pantoprazole (PROTONIX) IV  40 mg Intravenous Q12H   potassium chloride  40 mEq Oral Once   QUEtiapine  50 mg Oral QHS   sodium chloride flush  10-40 mL Intracatheter Q12H   sucralfate  1 g Oral Q6H   umeclidinium bromide  1  puff Inhalation Daily   Continuous inpatient infusions:   cefTRIAXone (ROCEPHIN)  IV 2 g (07/22/23 1440)   dextrose 5 % and 0.45 % NaCl 40 mL/hr at 07/24/23 1218   linezolid (ZYVOX) IV 600 mg (07/24/23 0859)   PRN inpatient medications: acetaminophen **OR** acetaminophen, hydrALAZINE, ipratropium-albuterol, metoprolol tartrate, ondansetron **OR** ondansetron (ZOFRAN) IV, oxyCODONE, senna-docusate, sodium chloride flush, traZODone  Vital signs in last 24 hours: Temp:  [97.7 F (36.5 C)-98.5 F (36.9 C)] 97.8 F (36.6 C) (02/27 0812) Pulse Rate:  [70-79] 70 (02/27 0812) Resp:  [17] 17 (02/27 0812) BP: (120-128)/(51-83) 128/83 (02/27 0812) SpO2:  [100 %] 100 % (02/27  0812) [name] : 07/23/23  Intake/Output Summary (Last 24 hours) at 07/24/2023 1247 Last data filed at 07/24/2023 1100 Gross per 24 hour  Intake 1680.17 ml  Output 475 ml  Net 1205.17 ml    Intake/Output from previous day: 02/26 0701 - 02/27 0700 In: 361 [P.O.:60; I.V.:1; IV Piggyback:300] Out: 575 [Urine:575] Intake/Output this shift: Total I/O In: 1319.1 [I.V.:719.1; IV Piggyback:600] Out: -    Physical Exam:  General: Alert female in NAD Heart:  Regular rate and rhythm.  Pulmonary: Normal respiratory effort Abdomen: Soft, nondistended, nontender. Normal bowel sounds. Psych:  Cooperative.     LOS: 8 days   Willette Cluster ,NP 07/24/2023, 12:47 PM  GI ATTENDING  Interval history and data reviewed.  Patient seen and examined.  Agree with interval progress note.  Patient looks a bit better today.  Still, fortunately, stable from GI perspective without evidence of bleeding in several days.  Hemoglobin stable.  Continue current measures.  We are hoping to have EGD tomorrow, but this is dependent on anesthesia availability.  Stay posted.  Wilhemina Bonito. Eda Keys., M.D. [facility] Division of Gastroenterology

## 2023-07-24 NOTE — Progress Notes (Signed)
 Nurse tech attempted to feed patient her lunch, Patient moved her head away and refused to open her mouth. Patient fails to follow simple commands making oral med admin increasingly difficult. Patient will take oral medication at times with a large amount of coaching. However, the majority of the time patient will not open her mouth or swallow anything PO.

## 2023-07-24 NOTE — Progress Notes (Addendum)
 Daily Progress Note  DOA: 07/15/2023 Hospital Day: 10   Chief Complaint:  melena  ASSESSMENT    Brief Narrative:  Cindy Padilla is a 83 y.o. year old female with a history of  dementia, HTN, CAD on plavix, COPD, critical aortic stenosis, large hiatal hernia. Admitted 2/18 with sepsis / CAP / acute encephalopathy, UTI / lower extremity cellulitis. Developed dark stool and decline in hgb. GI saw in consult 2/23   GI bleed, presumably upper with melena.  Plavix + Lovenox still on hold.  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 Last plavix dose was 2/21 Today:  Not actively bleeding.   Acute blood loss anemia / macrocytosis Hgb low but overall stable 7.2 to 7.9 over last few days.    Severe sepsis  ( CAP, UTI, cellulitis) and / acute encephalopathy ( poa) Recent Influenza A, E.coli UTI, CAP / BLE cellulitis. On Rocephin and Zyvox  Today:  Much more alert and responsive today, possibly due to discontinuation of seroquel and lamictal yesterday. Afebrile, normal WBC. Negative CXR yesterday.    Dementia.  Baseline unknown. Oriented to place but confused otherwise    Severe aortic stenosis  Principal Problem:   Community acquired bacterial pneumonia Active Problems:   Sepsis (HCC)   Acute blood loss anemia   Platelet inhibition due to Plavix   PLAN   --Eventual EGD, timing to be announced. Dr. Chales Abrahams previously discussed with daughter.  --Continue BID Pantoprazole  Subjective   Says she cannot urinate - has foley   Objective    Recent Labs    07/22/23 0317 07/23/23 0323 07/24/23 0329  WBC 4.2 5.3 4.3  HGB 7.9* 7.6* 7.4*  HCT 26.9* 25.1* 24.7*  PLT 222 213 188   BMET Recent Labs    07/22/23 0317 07/23/23 0323 07/24/23 0329  NA 142 143 137  K 3.6 4.0 3.4*  CL 113* 114* 110  CO2 23 22 21*  GLUCOSE 122* 89 109*  BUN 12 13 14   CREATININE 0.69 0.69 0.89  CALCIUM 7.9* 7.6* 7.4*   LFT No results for input(s): "PROT", "ALBUMIN",  "AST", "ALT", "ALKPHOS", "BILITOT", "BILIDIR", "IBILI" in the last 72 hours. PT/INR No results for input(s): "LABPROT", "INR" in the last 72 hours.   Imaging:  DG Chest Port 1 View CLINICAL DATA:  Dyspnea  EXAM: PORTABLE CHEST 1 VIEW  COMPARISON:  Chest x-ray 07/15/2023  FINDINGS: Generator overlies the left chest, unchanged. The heart size and mediastinal contours are within normal limits. Both lungs are clear. The visualized skeletal structures are unremarkable.  IMPRESSION: No active disease.  Electronically Signed   By: Darliss Cheney M.D.   On: 07/23/2023 18:12 [place] EKG SITE RITE If Site Rite image not attached, placement could not be confirmed due to  current cardiac rhythm.     Scheduled inpatient medications:   amLODipine  5 mg Oral Daily   atorvastatin  20 mg Oral QPM   Chlorhexidine Gluconate Cloth  6 each Topical Daily   donepezil  10 mg Oral QHS   fluticasone furoate-vilanterol  1 puff Inhalation Daily   lamoTRIgine  100 mg Oral BID   montelukast  10 mg Oral QHS   pantoprazole (PROTONIX) IV  40 mg Intravenous Q12H   potassium chloride  40 mEq Oral Once   QUEtiapine  50 mg Oral QHS   sodium chloride flush  10-40 mL Intracatheter Q12H   sucralfate  1 g Oral Q6H   umeclidinium bromide  1  puff Inhalation Daily   Continuous inpatient infusions:   cefTRIAXone (ROCEPHIN)  IV 2 g (07/22/23 1440)   dextrose 5 % and 0.45 % NaCl 40 mL/hr at 07/24/23 1218   linezolid (ZYVOX) IV 600 mg (07/24/23 0859)   PRN inpatient medications: acetaminophen **OR** acetaminophen, hydrALAZINE, ipratropium-albuterol, metoprolol tartrate, ondansetron **OR** ondansetron (ZOFRAN) IV, oxyCODONE, senna-docusate, sodium chloride flush, traZODone  Vital signs in last 24 hours: Temp:  [97.7 F (36.5 C)-98.5 F (36.9 C)] 97.8 F (36.6 C) (02/27 0812) Pulse Rate:  [70-79] 70 (02/27 0812) Resp:  [17] 17 (02/27 0812) BP: (120-128)/(51-83) 128/83 (02/27 0812) SpO2:  [100 %] 100 % (02/27  0812) [name] : 07/23/23  Intake/Output Summary (Last 24 hours) at 07/24/2023 1247 Last data filed at 07/24/2023 1100 Gross per 24 hour  Intake 1680.17 ml  Output 475 ml  Net 1205.17 ml    Intake/Output from previous day: 02/26 0701 - 02/27 0700 In: 361 [P.O.:60; I.V.:1; IV Piggyback:300] Out: 575 [Urine:575] Intake/Output this shift: Total I/O In: 1319.1 [I.V.:719.1; IV Piggyback:600] Out: -    Physical Exam:  General: Alert female in NAD Heart:  Regular rate and rhythm.  Pulmonary: Normal respiratory effort Abdomen: Soft, nondistended, nontender. Normal bowel sounds. Psych:  Cooperative.     LOS: 8 days   Willette Cluster ,NP 07/24/2023, 12:47 PM  GI ATTENDING  Interval history and data reviewed.  Patient seen and examined.  Agree with interval progress note.  Patient looks a bit better today.  Still, fortunately, stable from GI perspective without evidence of bleeding in several days.  Hemoglobin stable.  Continue current measures.  We are hoping to have EGD tomorrow, but this is dependent on anesthesia availability.  Stay posted.  Wilhemina Bonito. Eda Keys., M.D. [facility] Division of Gastroenterology

## 2023-07-24 NOTE — Progress Notes (Signed)
 Physical Therapy Treatment Patient Details Name: Cindy Padilla MRN: [id number] DOB: 1940-07-31 Today's Date: 07/24/2023   History of Present Illness Patient is a 83 year old female who presented from memory care unit  with weakness, CAP on 07/15/23. Patient was noted to have been diagnosed with influenza and UTI 07/04/23 .patient was admitted with acute encephalopathy, severe sepsis secondary to UTI and AKI.   PMH: bipolar, dementia, DM, GERD, IBS, HTN, COPD.    PT Comments  Pt appears to agree to sitting EOB. Pt requires total assist of 2 for supine<>sit, unable to self assist, unable to follow commands; requires max assist to sit EOB. maintains bil UEs in flexion, grimaces with PROM to UEs/LEs, head in fwd flexion and is coughing  excessively throughout PT session, seems to be unable to manage her own secretions-wash cloth and [place] used to clear saliva.  Pt is not progressing with PT, continues to demonstrate functional decline and is unable to participate with PT. PT signing off at this time; May benefit from Palliative Care consult.     If plan is discharge home, recommend the following: Two people to help with walking and/or transfers;Assistance with cooking/housework;Two people to help with bathing/dressing/bathroom;Direct supervision/assist for medications management;Assist for transportation;Help with stairs or ramp for entrance;Direct supervision/assist for financial management;Supervision due to cognitive status   Can travel by private vehicle     No  Equipment Recommendations  None recommended by PT    Recommendations for Other Services       Precautions / Restrictions Precautions Precautions: Fall Recall of Precautions/Restrictions: Impaired Restrictions Weight Bearing Restrictions Per Provider Order: No Other Position/Activity Restrictions: wound on LLE/cellulitis/painful     Mobility  Bed Mobility Overal bed mobility: Needs Assistance Bed Mobility: Sit to  Supine, Supine to Sit     Supine to sit: +2 for physical assistance, Total assist, +2 for safety/equipment Sit to supine: Total assist, +2 for physical assistance, +2 for safety/equipment   General bed mobility comments: +2 assist to elevate/control descent of trunk and progress LEs off and on to bed . total assist to scoot up in supine with bed in trendelenberg    Transfers                   General transfer comment: NT/unable    Ambulation/Gait               General Gait Details: unable   Stairs             Wheelchair Mobility     Tilt Bed    Modified Rankin (Stroke Patients Only)       Balance Overall balance assessment: Needs assistance, History of Falls Sitting-balance support: No upper extremity supported, Feet supported Sitting balance-Leahy Scale: Poor Sitting balance - Comments: poor to zero; unable to correct posterior lean with multi-modal cues Postural control: Posterior lean                                  Communication Communication Communication: Impaired Factors Affecting Communication: Reduced clarity of speech;Difficulty expressing self  Cognition Arousal: Alert Behavior During Therapy: Flat affect   PT - Cognitive impairments: No family/caregiver present to determine baseline, Memory, Orientation, Awareness, Problem solving, Attention, Initiation                       PT - Cognition Comments: unable to assess; pt verbalizes very little, humming  at times; states umhmm, uh uh seemingly as yes/no appropriately at times; Umhmm to siting and then  to lying down when asked Following commands: Impaired Following commands impaired: Follows one step commands inconsistently    Cueing Cueing Techniques: Verbal cues, Gestural cues, Tactile cues  Exercises General Exercises - Lower Extremity Ankle Circles/Pumps: Both, 10 reps, Supine, PROM Other Exercises Other Exercises: PROM bil UEs x5, pt grimaces with   movement    General Comments        Pertinent Vitals/Pain Pain Assessment Pain Assessment: Faces Faces Pain Scale: Hurts even more Pain Location: LEs and UEs with movement Pain Descriptors / Indicators: Grimacing, Guarding, Moaning Pain Intervention(s): Limited activity within patient's tolerance, Monitored during session, Repositioned    Home Living                          Prior Function            PT Goals (current goals can now be found in the care plan section) Acute Rehab PT Goals PT Goal Formulation: Patient unable to participate in goal setting Time For Goal Achievement: 07/31/23 Potential to Achieve Goals: Good Progress towards PT goals: Not progressing toward goals - comment (medical issues, unable to follow commands)    Frequency    Min 1X/week      PT Plan      Co-evaluation              AM-PAC PT "6 Clicks" Mobility   Outcome Measure  Help needed turning from your back to your side while in a flat bed without using bedrails?: Total Help needed moving from lying on your back to sitting on the side of a flat bed without using bedrails?: Total Help needed moving to and from a bed to a chair (including a wheelchair)?: Total Help needed standing up from a chair using your arms (e.g., wheelchair or bedside chair)?: Total Help needed to walk in hospital room?: Total Help needed climbing 3-5 steps with a railing? : Total 6 Click Score: 6    End of Session   Activity Tolerance: Patient limited by fatigue;Patient limited by pain;Other (comment) (cognition) Patient left: in bed;with call bell/phone within reach;with bed alarm set Nurse Communication: Mobility status;Other (comment) (excessive coughing) PT Visit Diagnosis: Difficulty in walking, not elsewhere classified (R26.2);History of falling (Z91.81)     Time: 9604-5409 PT Time Calculation (min) (ACUTE ONLY): 13 min  Charges:    $Therapeutic Activity: 8-22 mins PT General  Charges $$ ACUTE PT VISIT: 1 Visit                     Lexee Brashears, PT  Acute Rehab Dept [facility]) [phone]  07/24/2023    [facility] 07/24/2023, 11:36 AM

## 2023-07-24 NOTE — Progress Notes (Signed)
 Patient had a medium sized black, tarry stool/ Patient remains confused.

## 2023-07-25 ENCOUNTER — Inpatient Hospital Stay (HOSPITAL_COMMUNITY): Payer: Medicare Other | Admitting: Certified Registered"

## 2023-07-25 ENCOUNTER — Other Ambulatory Visit: Payer: Self-pay

## 2023-07-25 ENCOUNTER — Encounter (HOSPITAL_COMMUNITY)
Admission: EM | Disposition: A | Discharge status: Nursing Home (Includes ICF) | Payer: Self-pay | Source: Home / Self Care | Attending: Internal Medicine

## 2023-07-25 ENCOUNTER — Inpatient Hospital Stay (HOSPITAL_COMMUNITY): Payer: Medicare Other

## 2023-07-25 ENCOUNTER — Encounter (HOSPITAL_COMMUNITY): Payer: Self-pay | Admitting: Internal Medicine

## 2023-07-25 DIAGNOSIS — K269 Duodenal ulcer, unspecified as acute or chronic, without hemorrhage or perforation: Secondary | ICD-10-CM | POA: Diagnosis not present

## 2023-07-25 DIAGNOSIS — J159 Unspecified bacterial pneumonia: Secondary | ICD-10-CM | POA: Diagnosis not present

## 2023-07-25 DIAGNOSIS — D62 Acute posthemorrhagic anemia: Secondary | ICD-10-CM | POA: Diagnosis not present

## 2023-07-25 DIAGNOSIS — K3189 Other diseases of stomach and duodenum: Secondary | ICD-10-CM

## 2023-07-25 DIAGNOSIS — K921 Melena: Secondary | ICD-10-CM | POA: Diagnosis not present

## 2023-07-25 HISTORY — PX: BIOPSY: SHX5522

## 2023-07-25 HISTORY — PX: ESOPHAGOGASTRODUODENOSCOPY (EGD) WITH PROPOFOL: SHX5813

## 2023-07-25 LAB — GLUCOSE, CAPILLARY
Glucose-Capillary: 105 mg/dL — ABNORMAL HIGH (ref 70–99)
Glucose-Capillary: 113 mg/dL — ABNORMAL HIGH (ref 70–99)
Glucose-Capillary: 73 mg/dL (ref 70–99)
Glucose-Capillary: 74 mg/dL (ref 70–99)
Glucose-Capillary: 89 mg/dL (ref 70–99)
Glucose-Capillary: 99 mg/dL (ref 70–99)

## 2023-07-25 LAB — CBC
HCT: 26.9 % — ABNORMAL LOW (ref 36.0–46.0)
Hemoglobin: 8.1 g/dL — ABNORMAL LOW (ref 12.0–15.0)
MCH: 29.9 pg (ref 26.0–34.0)
MCHC: 30.1 g/dL (ref 30.0–36.0)
MCV: 99.3 fL (ref 80.0–100.0)
Platelets: 199 10*3/uL (ref 150–400)
RBC: 2.71 MIL/uL — ABNORMAL LOW (ref 3.87–5.11)
RDW: 19 % — ABNORMAL HIGH (ref 11.5–15.5)
WBC: 5.7 10*3/uL (ref 4.0–10.5)
nRBC: 0 % (ref 0.0–0.2)

## 2023-07-25 LAB — BASIC METABOLIC PANEL
Anion gap: 6 (ref 5–15)
BUN: 14 mg/dL (ref 8–23)
CO2: 23 mmol/L (ref 22–32)
Calcium: 7.7 mg/dL — ABNORMAL LOW (ref 8.9–10.3)
Chloride: 108 mmol/L (ref 98–111)
Creatinine, Ser: 0.92 mg/dL (ref 0.44–1.00)
GFR, Estimated: >60 mL/min (ref >60)
Glucose, Bld: 111 mg/dL — ABNORMAL HIGH (ref 70–99)
Potassium: 3.6 mmol/L (ref 3.5–5.1)
Sodium: 137 mmol/L (ref 135–145)

## 2023-07-25 LAB — MAGNESIUM: Magnesium: 2.1 mg/dL (ref 1.7–2.4)

## 2023-07-25 SURGERY — ESOPHAGOGASTRODUODENOSCOPY (EGD) WITH PROPOFOL
Anesthesia: Monitor Anesthesia Care

## 2023-07-25 MED ORDER — PROPOFOL 500 MG/50ML IV EMUL
INTRAVENOUS | Status: DC | PRN
  Administered 2023-07-25: 125 ug/kg/min via INTRAVENOUS

## 2023-07-25 MED ORDER — PHENYLEPHRINE HCL-NACL 20-0.9 MG/250ML-% IV SOLN
INTRAVENOUS | Status: DC | PRN
Start: 2023-07-25 — End: 2023-07-25
  Administered 2023-07-25: 30 ug/min via INTRAVENOUS

## 2023-07-25 MED ORDER — SUCRALFATE 1 GM/10ML PO SUSP
1.0000 g | Freq: Four times a day (QID) | ORAL | Status: AC
  Administered 2023-07-25: 1 g via ORAL
  Filled 2023-07-25 (×5): qty 10

## 2023-07-25 MED ORDER — POTASSIUM CHLORIDE 10 MEQ/100ML IV SOLN
10.0000 meq | INTRAVENOUS | Status: AC
  Administered 2023-07-25 – 2023-07-26 (×3): 10 meq via INTRAVENOUS
  Filled 2023-07-25 (×3): qty 100

## 2023-07-25 MED ORDER — POTASSIUM CHLORIDE 20 MEQ PO PACK
40.0000 meq | PACK | Freq: Once | ORAL | Status: DC
  Filled 2023-07-25: qty 2

## 2023-07-25 MED ORDER — LIDOCAINE 2% (20 MG/ML) 5 ML SYRINGE
INTRAMUSCULAR | Status: DC | PRN
Start: 2023-07-25 — End: 2023-07-25
  Administered 2023-07-25: 60 mg via INTRAVENOUS

## 2023-07-25 MED ORDER — PHENYLEPHRINE 80 MCG/ML (10ML) SYRINGE FOR IV PUSH (FOR BLOOD PRESSURE SUPPORT)
PREFILLED_SYRINGE | INTRAVENOUS | Status: DC | PRN
  Administered 2023-07-25: 160 ug via INTRAVENOUS

## 2023-07-25 MED ORDER — PROPOFOL 500 MG/50ML IV EMUL
INTRAVENOUS | Status: AC
  Filled 2023-07-25: qty 50

## 2023-07-25 MED ORDER — SODIUM CHLORIDE 0.9 % IV SOLN: INTRAVENOUS | Status: DC | PRN

## 2023-07-25 MED ORDER — PROPOFOL 10 MG/ML IV BOLUS
INTRAVENOUS | Status: DC | PRN
  Administered 2023-07-25: 20 mg via INTRAVENOUS

## 2023-07-25 MED ORDER — SODIUM CHLORIDE 0.9 % IV SOLN
2.0000 g | INTRAVENOUS | Status: DC
  Administered 2023-07-26 – 2023-07-28 (×2): 2 g via INTRAVENOUS
  Filled 2023-07-25 (×2): qty 20

## 2023-07-25 MED ORDER — PANTOPRAZOLE SODIUM 40 MG PO TBEC
40.0000 mg | DELAYED_RELEASE_TABLET | Freq: Two times a day (BID) | ORAL | Status: DC
  Administered 2023-07-25 – 2023-07-28 (×6): 40 mg via ORAL
  Filled 2023-07-25 (×9): qty 1

## 2023-07-25 MED ORDER — FUROSEMIDE 10 MG/ML IJ SOLN
20.0000 mg | Freq: Once | INTRAMUSCULAR | Status: AC
  Administered 2023-07-25: 20 mg via INTRAVENOUS
  Filled 2023-07-25: qty 2

## 2023-07-25 SURGICAL SUPPLY — 14 items

## 2023-07-25 NOTE — Progress Notes (Addendum)
 PROGRESS NOTE    Cindy Padilla  WUJ:[id number] DOB: June 23, 1940 DOA: 07/15/2023 PCP: Valere Dross, FNP    Brief Narrative:   83 year old female, with past medical history significant for dementia, HTN, HLD, CAD, COPD, mood disorder who presented to [facility] ED on 2/18 via EMS from [facility] memory care facility with confusion, progressive decline in activity over the preceding week.  Recently diagnosed with ESBL UTI and influenza.  Now admitted for failure to thrive causing electrolyte imbalance, encephalopathy and Hemoccult stool.  [facility] consulted, planning endoscopy.  EGD 2/28= Nonbleeding ulcer, PPI BID x 2 months thereafter daily.    Assessment & Plan:  Principal Problem:   Community acquired bacterial pneumonia Active Problems:   Sepsis (HCC)    Acute metabolic encephalopathy:  Overall improving.  Likely from multiple underlying ongoing issue including infection from ESBL UTI BS ok, Ammonia is normal.  CT head showed chronic changes.  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     Severe sepsis due to ESBL UTI, POA Sepsis physiology is improving.  Urine cultures from 2/18 reviewed.  Showing ESBL -Completed 5 days of ertapenem 2/18 - 2/22  Acute hypoxia - After receiving Lasix, we were able to wean down her oxygen.  Continue bronchodilators.  Will give her additional Lasix 20 mg IV   Anemia, likely acute blood loss anemia Macrocytosis Baseline hemoglobin 11, admission hemoglobin 7.8 with positive Hemoccult stool.  Currently on PPI twice daily and Carafate - EGD 2021 showed esophagitis and Sheria Lang lesion - EGD 2/28= Nonbleeding ulcer, PPI BID x 2 months thereafter daily.    Bilateral lower extremity cellulitis Left lower extremity necrotic wound -Left shin wounds from wheelchair incident per family as another residents of the facility ran into her leg a few weeks ago. -Due to ongoing concerns of  this currently on IV Linezolid and Rocephin  (total 14 days) -Seen by wound care team   Acute renal failure: Resolved -Baseline creatinine 0.6, admission creatinine 1.6   Dysphagia -Continue dysphagia 2 diet   Recent influenza A viral infection Originally diagnosed with influenza A on 07/04/2023; completed course of Tamiflu at facility.   Hyperkalemia: Resolved        Hypokalemia/hypophosphatemia -As needed repletion   Acute urinary retention Foley catheter was placed in the ED for acute urinary retention. -will attempt foley removal prior to dc.    Pleural effusion Severe aortic stenosis Reviewed TTE from December 2024 with preserved LVEF, grade 1 diastolic dysfunction, large pleural effusion and severe aortic stenosis.  Patient is asymptomatic, no chest pain, no tamponade physiology.  Has outpatient appointment coming up with cardiology, Dr. Odis Hollingshead on 08/11/2023   Essential hypertension -Amlodipine 5 mg p.o. daily. Iv prn   Hyperlipidemia -Atorvastatin 20mg  p.o. daily   CAD Lipitor continue atorvastatin 20 mg p.o. daily -Plavix on hold   COPD:  -Bronchodilators   Dementia Mood disorder Adult failure to thrive -Cont bedtime Seroquel, dc am Seroquel. Lamictal reduced 150mg  bid (used to be on 200mg )= bid)   Weakness/deconditioning/gait disturbance/debility: Patient residing at [facility] memory care facility.  Most recent baseline able to ambulate with the use of a walker but since has been utilizing a wheelchair due to acute decline with recent diagnosis of influenza A on 2/7. -PT/OT evaluation  PICC placed on 2/26 due to lack of IV Access.      DVT prophylaxis: SCDs Start: 07/15/23 2126  DNR/DNI Family Communication: Clide Cliff  Dc tomorrow  Subjective: Seen at bedside after egd No complaints, looks better.   Examination:  General exam: Appears calm and comfortable, cachectic frail Respiratory system: Clear to auscultation. Respiratory effort  normal. Cardiovascular system: S1 & S2 heard, RRR. No JVD, murmurs, rubs, gallops or clicks. No pedal edema. Gastrointestinal system: Abdomen is nondistended, soft and nontender. No organomegaly or masses felt. Normal bowel sounds heard. Central nervous system: Alert and oriented. No focal neurological deficits. Extremities: Symmetric 4 x 5 power. Skin: No rashes, lesions or ulcers Psychiatry: Judgement and insight appear poor foley           Pressure Injury Sacrum Right;Left;Mid Stage 1 -  Intact skin with non-blanchable redness of a localized area usually over a bony prominence. (Active)     Location: Sacrum  Location Orientation: Right;Left;Mid  Staging: Stage 1 -  Intact skin with non-blanchable redness of a localized area usually over a bony prominence.  Wound Description (Comments):   Present on Admission:      Diet Orders (From admission, onward)     Start     Ordered   07/25/23 1201  DIET DYS 2 Room service appropriate? Yes; Fluid consistency: Thin  Diet effective now       Question Answer Comment  Room service appropriate? Yes   Fluid consistency: Thin      07/25/23 1200            Objective: Vitals:   07/25/23 1120 07/25/23 1124 07/25/23 1130 07/25/23 1201  BP: (!) 155/48  (!) 146/47 (!) 133/59  Pulse: 62  (!) 56 67  Resp: 12  12 15   Temp:  97.9 F (36.6 C)  97.8 F (36.6 C)  TempSrc:  Temporal  Oral  SpO2: 100%  100% 99%  Weight:      Height:        Intake/Output Summary (Last 24 hours) at 07/25/2023 1306 Last data filed at 07/25/2023 1120 Gross per 24 hour  Intake 612.61 ml  Output 0 ml  Net 612.61 ml   Filed Weights   07/16/23 0000 07/16/23 0330  Weight: 70.3 kg 63.3 kg    Scheduled Meds:  amLODipine  5 mg Oral Daily   atorvastatin  20 mg Oral QPM   Chlorhexidine Gluconate Cloth  6 each Topical Daily   donepezil  10 mg Oral QHS   fluticasone furoate-vilanterol  1 puff Inhalation Daily   furosemide  20 mg Intravenous Once    lamoTRIgine  100 mg Oral BID   montelukast  10 mg Oral QHS   pantoprazole  40 mg Oral BID AC   potassium chloride  40 mEq Oral Once   potassium chloride  40 mEq Oral Once   QUEtiapine  50 mg Oral QHS   sodium chloride flush  10-40 mL Intracatheter Q12H   sucralfate  1 g Oral Q6H   umeclidinium bromide  1 puff Inhalation Daily   Continuous Infusions:  cefTRIAXone (ROCEPHIN)  IV 2 g (07/24/23 1323)   linezolid (ZYVOX) IV 600 mg (07/24/23 2154)    Nutritional status     Body mass index is 27.25 kg/m.  Data Reviewed:   CBC: Recent Labs  Lab 07/20/23 0721 07/22/23 0317 07/23/23 0323 07/24/23 0329 07/25/23 0213  WBC  --  4.2 5.3 4.3 5.7  HGB 7.9* 7.9* 7.6* 7.4* 8.1*  HCT 26.5* 26.9* 25.1* 24.7* 26.9*  MCV  --  101.9* 100.4* 101.2* 99.3  PLT  --  222 213 188 199   Basic Metabolic  Panel: Recent Labs  Lab 07/18/23 1537 07/18/23 1537 07/19/23 0343 07/20/23 0143 07/22/23 0317 07/23/23 0323 07/24/23 0329 07/25/23 0213  NA 140  --  139 142 142 143 137 137  K 3.9  --  4.0 3.8 3.6 4.0 3.4* 3.6  CL 105  --  108 113* 113* 114* 110 108  CO2 26  --  22 25 23 22  21* 23  GLUCOSE 106*  --  79 89 122* 89 109* 111*  BUN 10  --  11 11 12 13 14 14   CREATININE 0.54  --  0.57 0.64 0.69 0.69 0.89 0.92  CALCIUM 8.0*  --  7.6* 7.7* 7.9* 7.6* 7.4* 7.7*  MG  --    < > 2.0 2.1 2.1 2.1 2.1 2.1  PHOS 3.3  --  2.8 2.5 2.3*  --   --   --    < > = values in this interval not displayed.   GFR: Estimated Creatinine Clearance: 38.5 mL/min (by C-G formula based on SCr of 0.92 mg/dL). Liver Function Tests: Recent Labs  Lab 07/18/23 1537 07/19/23 0343 07/20/23 0143  ALBUMIN 2.1* 1.6* 1.5*   No results for input(s): "LIPASE", "AMYLASE" in the last 168 hours. Recent Labs  Lab 07/21/23 1050  AMMONIA 22   Coagulation Profile: No results for input(s): "INR", "PROTIME" in the last 168 hours. Cardiac Enzymes: No results for input(s): "CKTOTAL", "CKMB", "CKMBINDEX", "TROPONINI" in the last  168 hours. BNP (last 3 results) No results for input(s): "PROBNP" in the last 8760 hours. HbA1C: No results for input(s): "HGBA1C" in the last 72 hours. CBG: Recent Labs  Lab 07/24/23 2041 07/25/23 0046 07/25/23 0505 07/25/23 0822 07/25/23 1154  GLUCAP 112* 105* 113* 89 73   Lipid Profile: No results for input(s): "CHOL", "HDL", "LDLCALC", "TRIG", "CHOLHDL", "LDLDIRECT" in the last 72 hours. Thyroid Function Tests: No results for input(s): "TSH", "T4TOTAL", "FREET4", "T3FREE", "THYROIDAB" in the last 72 hours. Anemia Panel: No results for input(s): "VITAMINB12", "FOLATE", "FERRITIN", "TIBC", "IRON", "RETICCTPCT" in the last 72 hours. Sepsis Labs: No results for input(s): "PROCALCITON", "LATICACIDVEN" in the last 168 hours.  Recent Results (from the past 240 hours)  Resp panel by RT-PCR (RSV, Flu A&B, Covid) Anterior Nasal Swab     Status: Abnormal   Collection Time: 07/15/23  1:55 PM   Specimen: Anterior Nasal Swab  Result Value Ref Range Status   SARS Coronavirus 2 by RT PCR NEGATIVE NEGATIVE Final    Comment: (NOTE) SARS-CoV-2 target nucleic acids are NOT DETECTED.  The SARS-CoV-2 RNA is generally detectable in upper respiratory specimens during the acute phase of infection. The lowest concentration of SARS-CoV-2 viral copies this assay can detect is 138 copies/mL. A negative result does not preclude SARS-Cov-2 infection and should not be used as the sole basis for treatment or other patient management decisions. A negative result may occur with  improper specimen collection/handling, submission of specimen other than nasopharyngeal swab, presence of viral mutation(s) within the areas targeted by this assay, and inadequate number of viral copies(<138 copies/mL). A negative result must be combined with clinical observations, patient history, and epidemiological information. The expected result is Negative.  Fact Sheet for Patients:   BloggerCourse.com  Fact Sheet for Healthcare Providers:  SeriousBroker.it  This test is no t yet approved or cleared by the [place] FDA and  has been authorized for detection and/or diagnosis of SARS-CoV-2 by FDA under an Emergency Use Authorization (EUA). This EUA will remain  in effect (meaning this test  can be used) for the duration of the COVID-19 declaration under Section 564(b)(1) of the Act, 21 U.S.C.section 360bbb-3(b)(1), unless the authorization is terminated  or revoked sooner.       Influenza A by PCR POSITIVE (A) NEGATIVE Final   Influenza B by PCR NEGATIVE NEGATIVE Final    Comment: (NOTE) The Xpert Xpress SARS-CoV-2/FLU/RSV plus assay is intended as an aid in the diagnosis of influenza from Nasopharyngeal swab specimens and should not be used as a sole basis for treatment. Nasal washings and aspirates are unacceptable for Xpert Xpress SARS-CoV-2/FLU/RSV testing.  Fact Sheet for Patients: BloggerCourse.com  Fact Sheet for Healthcare Providers: SeriousBroker.it  This test is not yet approved or cleared by the [place] FDA and has been authorized for detection and/or diagnosis of SARS-CoV-2 by FDA under an Emergency Use Authorization (EUA). This EUA will remain in effect (meaning this test can be used) for the duration of the COVID-19 declaration under Section 564(b)(1) of the Act, 21 U.S.C. section 360bbb-3(b)(1), unless the authorization is terminated or revoked.     Resp Syncytial Virus by PCR NEGATIVE NEGATIVE Final    Comment: (NOTE) Fact Sheet for Patients: BloggerCourse.com  Fact Sheet for Healthcare Providers: SeriousBroker.it  This test is not yet approved or cleared by the [place] FDA and has been authorized for detection and/or diagnosis of SARS-CoV-2 by FDA under an Emergency Use  Authorization (EUA). This EUA will remain in effect (meaning this test can be used) for the duration of the COVID-19 declaration under Section 564(b)(1) of the Act, 21 U.S.C. section 360bbb-3(b)(1), unless the authorization is terminated or revoked.  Performed at [facility], [address] [place], [place] [address]   Urine Culture     Status: Abnormal   Collection Time: 07/15/23  8:02 PM   Specimen: Urine, Catheterized  Result Value Ref Range Status   Specimen Description   Final    URINE, CATHETERIZED Performed at [facility], [address], [place], [place] [address]    Special Requests   Final    NONE Performed at [facility], [address], [place], [place] [address]    Culture >=100,000 COLONIES/mL ESCHERICHIA COLI (A)  Final   Report Status 07/17/2023 FINAL  Final   Organism ID, Bacteria ESCHERICHIA COLI (A)  Final      Susceptibility   Escherichia coli - MIC*    AMPICILLIN >=32 RESISTANT Resistant     CEFAZOLIN >=64 RESISTANT Resistant     CEFEPIME 2 SENSITIVE Sensitive     CEFTRIAXONE 32 RESISTANT Resistant     CIPROFLOXACIN >=4 RESISTANT Resistant     GENTAMICIN <=1 SENSITIVE Sensitive     IMIPENEM <=0.25 SENSITIVE Sensitive     NITROFURANTOIN <=16 SENSITIVE Sensitive     TRIMETH/SULFA >=320 RESISTANT Resistant     AMPICILLIN/SULBACTAM 4 SENSITIVE Sensitive     PIP/TAZO <=4 SENSITIVE Sensitive ug/mL    * >=100,000 COLONIES/mL ESCHERICHIA COLI  MRSA Next Gen by PCR, Nasal     Status: None   Collection Time: 07/16/23  6:26 AM   Specimen: Nasal Mucosa; Nasal Swab  Result Value Ref Range Status   MRSA by PCR Next Gen NOT DETECTED NOT DETECTED Final    Comment: (NOTE) The GeneXpert MRSA Assay (FDA approved for NASAL specimens only), is one component of a comprehensive MRSA colonization surveillance program. It is not intended to diagnose MRSA infection nor to guide or monitor treatment for MRSA  infections. Test performance is not  FDA approved in patients less than 12 years old. Performed at [facility], [address], [place], [place] [address]   Culture, blood (Routine X 2) w Reflex to ID Panel     Status: None   Collection Time: 07/16/23  8:53 AM   Specimen: BLOOD LEFT HAND  Result Value Ref Range Status   Specimen Description   Final    BLOOD LEFT HAND Performed at [facility], [address] [place], [place] [address]    Special Requests   Final    BOTTLES DRAWN AEROBIC ONLY Blood Culture results may not be optimal due to an inadequate volume of blood received in culture bottles Performed at [facility], [address], [place], [place] [address]    Culture   Final    NO GROWTH 5 DAYS Performed at [facility], [address] [place], [place] [address]    Report Status 07/21/2023 FINAL  Final  Culture, blood (Routine X 2) w Reflex to ID Panel     Status: None   Collection Time: 07/16/23  8:53 AM   Specimen: BLOOD LEFT FOREARM  Result Value Ref Range Status   Specimen Description   Final    BLOOD LEFT FOREARM Performed at [facility], [address] [place], [place] [address]    Special Requests   Final    BOTTLES DRAWN AEROBIC ONLY Blood Culture results may not be optimal due to an inadequate volume of blood received in culture bottles Performed at [facility], [address] [place], [place] [address]    Culture   Final    NO GROWTH 5 DAYS Performed at [facility], [address] [place], [place] [address]    Report Status 07/21/2023 FINAL  Final         Radiology Studies: DG Chest Port 1 View Result Date: 07/23/2023 CLINICAL DATA:  Dyspnea EXAM: PORTABLE CHEST 1 VIEW COMPARISON:  Chest x-ray 07/15/2023 FINDINGS: Generator overlies the left chest, unchanged. The heart size and mediastinal contours are within normal limits. Both lungs are clear. The visualized  skeletal structures are unremarkable. IMPRESSION: No active disease. Electronically Signed   By: Darliss Cheney M.D.   On: 07/23/2023 18:12           LOS: 9 days   Time spent= 35 mins    Miguel Rota, MD [facility]  If 7PM-7AM, please contact night-coverage  07/25/2023, 1:06 PM

## 2023-07-25 NOTE — Transfer of Care (Signed)
 Immediate Anesthesia Transfer of Care Note  Patient: Cindy Padilla  Procedure(s) Performed: ESOPHAGOGASTRODUODENOSCOPY (EGD) WITH PROPOFOL BIOPSY  Patient Location: PACU  Anesthesia Type:MAC  Level of Consciousness: awake, alert , and oriented  Airway & Oxygen Therapy: Patient Spontanous Breathing and Patient connected to face mask oxygen  Post-op Assessment: Report given to RN and Post -op Vital signs reviewed and stable  Post vital signs: Reviewed and stable  Last Vitals:  Vitals Value Taken Time  BP 145/41 07/25/23 1118  Temp    Pulse 62 07/25/23 1120  Resp 12 07/25/23 1120  SpO2 100 % 07/25/23 1120  Vitals shown include unfiled device data.  Last Pain:  Vitals:   07/25/23 0509  TempSrc: Oral  PainSc:       Patients Stated Pain Goal: 0 (07/21/23 1218)  Complications: No notable events documented.

## 2023-07-25 NOTE — Progress Notes (Signed)
 Assessed bilateral arms for potential PIV/midline. No identifiable vessels for cannulation. Extremely poor vasculature. Primary nurse aware.

## 2023-07-25 NOTE — TOC Progression Note (Signed)
 Transition of Care [facility]) - Progression Note    Patient Details  Name: Cindy Padilla MRN: [id number] Date of Birth: 04-23-1941  [facility] CM/SW Contact  Amada Jupiter, LCSW Phone Number: 07/25/2023, 2:20 PM  Clinical Narrative:     Alerted by MD that pt may be medically ready for dc as early as tomorrow.  Have spoken with [facility] (facility bed offer accepted several days ago) and they, unfortunately, do not have an open bed until possibly Monday.  Discussed this with pt's daughter, Darl Pikes, who is agreed for CSW to restart SNF bed search to facilities.  Will ask weekend TOC coverage to review bed offers with daughter if pt is medically cleared for dc.    Expected Discharge Plan: Skilled Nursing Facility Barriers to Discharge: Continued Medical Work up, SNF Pending bed offer  Expected Discharge Plan and Services In-house Referral: Clinical Social Work   Post Acute Care Choice: Skilled Nursing Facility Living arrangements for the past 2 months:  (Memory Care at [facility])                 DME Arranged: N/A DME Agency: NA                   Social Determinants of Health (SDOH) Interventions SDOH Screenings   Food Insecurity: No Food Insecurity (05/24/2023)  Housing: Low Risk  (05/24/2023)  Transportation Needs: No Transportation Needs (05/24/2023)  Utilities: Not At Risk (05/24/2023)  Social Connections: Moderately Integrated (05/27/2023)  Tobacco Use: Medium Risk (07/25/2023)    Readmission Risk Interventions    07/17/2023    2:56 PM  Readmission Risk Prevention Plan  Transportation Screening Complete  PCP or Specialist Appt within 3-5 Days Complete  HRI or Home Care Consult Complete  Social Work Consult for Recovery Care Planning/Counseling Complete

## 2023-07-25 NOTE — Progress Notes (Signed)
 Peripherally Inserted Central Catheter Placement  The IV Nurse has discussed with the patient and/or persons authorized to consent for the patient, the purpose of this procedure and the potential benefits and risks involved with this procedure.  The benefits include less needle sticks, lab draws from the catheter, and the patient may be discharged home with the catheter. Risks include, but not limited to, infection, bleeding, blood clot (thrombus formation), and puncture of an artery; nerve damage and irregular heartbeat and possibility to perform a PICC exchange if needed/ordered by physician.  Alternatives to this procedure were also discussed.  Bard Power PICC patient education guide, fact sheet on infection prevention and patient information card has been provided to patient /or left at bedside.  PICC exchanged due to malposition of previously placed PICC.  PICC Placement Documentation  PICC Double Lumen 07/25/23 Right Brachial 32 cm 0 cm (Active)  Indication for Insertion or Continuance of Line Poor Vasculature-patient has had multiple peripheral attempts or PIVs lasting less than 24 hours 07/25/23 1600  Exposed Catheter (cm) 0 cm 07/25/23 1600  Site Assessment Clean, Dry, Intact 07/25/23 1600  Lumen #1 Status Flushed;Saline locked;Blood return noted 07/25/23 1600  Lumen #2 Status Flushed;Saline locked;Blood return noted 07/25/23 1600  Dressing Type Transparent;Securing device 07/25/23 1600  Dressing Status Antimicrobial disc/dressing in place;Clean, Dry, Intact 07/25/23 1600  Line Care Connections checked and tightened 07/25/23 1600  Line Adjustment (NICU/IV Team Only) No 07/25/23 1600  Dressing Intervention New dressing 07/25/23 1600  Dressing Change Due 08/01/23 07/25/23 1600       Morrissa Shein, Lajean Manes 07/25/2023, 5:40 PM

## 2023-07-25 NOTE — Anesthesia Preprocedure Evaluation (Addendum)
 Anesthesia Evaluation  Patient identified by MRN, date of birth, ID band Patient confused    Reviewed: Allergy & Precautions, NPO status , Patient's Chart, lab work & pertinent test results, Unable to perform ROS - Chart review only  History of Anesthesia Complications Negative for: history of anesthetic complications  Airway Mallampati: III  TM Distance: >3 FB Neck ROM: Full    Dental   Pulmonary asthma , pneumonia, COPD, Recent URI  (flu)   Pulmonary exam normal breath sounds clear to auscultation       Cardiovascular hypertension, Pt. on medications and Pt. on home beta blockers +CHF  + Valvular Problems/Murmurs (mild MS, severe AS)  Rhythm:Regular Rate:Normal  HLD  TTE 05/25/2023: IMPRESSIONS     1. Left ventricular ejection fraction, by estimation, is 65 to 70%. The  left ventricle has normal function. The left ventricle has no regional  wall motion abnormalities. There is mild left ventricular hypertrophy.  Left ventricular diastolic parameters  are consistent with Grade I diastolic dysfunction (impaired relaxation).   2. Right ventricular systolic function is normal. The right ventricular  size is normal. The estimated right ventricular systolic pressure is 25.3  mmHg.   3. Large pleural effusion.   4. The mitral valve is degenerative. Trivial mitral valve regurgitation.  Mild mitral stenosis. The mean mitral valve gradient is 7.0 mmHg with  average heart rate of 85 bpm.   5. The aortic valve is tricuspid. Aortic valve regurgitation is not  visualized. Severe aortic valve stenosis. Aortic valve area, by VTI  measures 0.74 cm. Aortic valve mean gradient measures 47.0 mmHg. Aortic  valve Vmax measures 3.89 m/s. Peak gradient  60.6 mmHg, DI 0.33.   6. The inferior vena cava is normal in size with <50% respiratory  variability, suggesting right atrial pressure of 8 mmHg.     Neuro/Psych  PSYCHIATRIC DISORDERS    Bipolar Disorder  Dementia Encephalopathy, improving. Chronic back pain. VNS.    GI/Hepatic hiatal hernia,GERD  ,,  Endo/Other  diabetes, Type 2    Renal/GU ARFRenal disease (resolved)     Musculoskeletal  (+) Arthritis ,    Abdominal   Peds  Hematology  (+) Blood dyscrasia, anemia Lab Results      Component                Value               Date                      WBC                      5.7                 07/25/2023                HGB                      8.1 (L)             07/25/2023                HCT                      26.9 (L)            07/25/2023                MCV  99.3                07/25/2023                PLT                      199                 07/25/2023              Anesthesia Other Findings 83 y.o. year old female with a history of  dementia, HTN, CAD on plavix, COPD, critical aortic stenosis, large hiatal hernia. Admitted 2/18 with sepsis / CAP / acute encephalopathy, UTI / lower extremity cellulitis. Developed dark stool and decline in hgb.  DNR  On Plavix  Reproductive/Obstetrics                             Anesthesia Physical Anesthesia Plan  ASA: 4  Anesthesia Plan: MAC   Post-op Pain Management:    Induction: Intravenous  PONV Risk Score and Plan: 2  Airway Management Planned: Natural Airway and Simple Face Mask  Additional Equipment: ClearSight  Intra-op Plan:   Post-operative Plan:   Informed Consent: I have reviewed the patients History and Physical, chart, labs and discussed the procedure including the risks, benefits and alternatives for the proposed anesthesia with the patient or authorized representative who has indicated his/her understanding and acceptance.   Patient has DNR.  Discussed DNR with power of attorney and Suspend DNR.     Plan Discussed with: CRNA and Anesthesiologist  Anesthesia Plan Comments: (Discussed with patient risks of MAC including, but not  limited to, minor pain or discomfort, hearing people in the room, and possible need for backup general anesthesia. Risks for general anesthesia also discussed including, but not limited to, sore throat, hoarse voice, chipped/damaged teeth, injury to vocal cords, nausea and vomiting, allergic reactions, lung infection, heart attack, stroke, and death. All questions answered.  Consent obtained from patient's daughter, Leonides Sake, via phone at [phone]. )        Anesthesia Quick Evaluation

## 2023-07-25 NOTE — Anesthesia Postprocedure Evaluation (Signed)
 Anesthesia Post Note  Patient: Cindy Padilla  Procedure(s) Performed: ESOPHAGOGASTRODUODENOSCOPY (EGD) WITH PROPOFOL BIOPSY     Patient location during evaluation: PACU Anesthesia Type: MAC Level of consciousness: awake Pain management: pain level controlled Vital Signs Assessment: post-procedure vital signs reviewed and stable Respiratory status: spontaneous breathing, nonlabored ventilation and respiratory function stable Cardiovascular status: stable and blood pressure returned to baseline Postop Assessment: no apparent nausea or vomiting Anesthetic complications: no   No notable events documented.  Last Vitals:  Vitals:   07/25/23 1124 07/25/23 1130  BP:  (!) 146/47  Pulse:  (!) 56  Resp:  12  Temp: 36.6 C   SpO2:  100%    Last Pain:  Vitals:   07/25/23 1133  TempSrc:   PainSc: 0-No pain                 Linton Rump

## 2023-07-25 NOTE — Op Note (Signed)
 [facility] Patient Name: Cindy Padilla Procedure Date: 07/25/2023 MRN: [id number] Attending MD: Wilhemina Bonito. Marina Goodell , MD, [id number] Date of Birth: 1941/03/04 CSN: [id number] Age: 83 Admit Type: Inpatient Procedure:                Upper GI endoscopy with biopsies Indications:              Acute post hemorrhagic anemia, Melena Providers:                Wilhemina Bonito. Marina Goodell, MD, Jacquelyn "Jaci" Cesar Chavez, RN,                            Salley Scarlet, Technician, [facility],                            CRNA Referring MD:             [facility] Medicines:                Monitored Anesthesia Care Complications:            No immediate complications. Estimated Blood Loss:     Estimated blood loss: none. Procedure:                Pre-Anesthesia Assessment:                           - Prior to the procedure, a History and Physical                            was performed, and patient medications and                            allergies were reviewed. The patient's tolerance of                            previous anesthesia was also reviewed. The risks                            and benefits of the procedure and the sedation                            options and risks were discussed with the patient.                            All questions were answered, and informed consent                            was obtained. Prior Anticoagulants: The patient has                            taken Plavix (clopidogrel), last dose was 7 days                            prior to procedure. ASA Grade Assessment: III - A  patient with severe systemic disease. After                            reviewing the risks and benefits, the patient was                            deemed in satisfactory condition to undergo the                            procedure.                           After obtaining informed consent, the endoscope was                            passed  under direct vision. Throughout the                            procedure, the patient's blood pressure, pulse, and                            oxygen saturations were monitored continuously. The                            [id number]-H190 ([id number]) Olympus endoscope was introduced                            through the mouth, and advanced to the duodenal                            bulb. The upper GI endoscopy was accomplished                            without difficulty. The patient tolerated the                            procedure well. Scope In: Scope Out: Findings:      The esophagus was normal.      The stomach was somewhat deformed with slight angulation and distal       narrowing, probably secondary to ventral hernia. A few erosions but no       other significant abnormalities. Biopsies were taken with a cold forceps       for histology.      One non-bleeding cratered duodenal ulcer with no stigmata of bleeding       was found in the duodenal bulb. The lesion was 10 mm in largest       dimension.      The examined duodenum was otherwise normal.      The cardia and gastric fundus were normal on retroflexion. Impression:               - Normal esophagus.                           - Normal but deformed stomach with a few erosions.  Biopsied.                           - Non-bleeding duodenal ulcer with no stigmata of                            bleeding. This was the cause of recent GI bleed.                           - Normal examined duodenum, otherwise. Moderate Sedation:      none Recommendation:           1. Diet of choice                           2. Change pantoprazole to 40 mg p.o. twice daily.                            She should take this twice daily for 2 months then                            once daily indefinitely.                           3. Continue Carafate slurry for another 48 hours,                            then discontinue.                            4. Avoid NSAIDs                           5. If Plavix is important in this patient's care,                            then okay to resume in 48 hours.                           I reviewed this information with the patient's                            daughter Leonides Sake. GI will sign off Procedure Code(s):        --- Professional ---                           [phone], Esophagogastroduodenoscopy, flexible,                            transoral; with biopsy, single or multiple Diagnosis Code(s):        --- Professional ---                           K26.9, Duodenal ulcer, unspecified as acute or  chronic, without hemorrhage or perforation                           D62, Acute posthemorrhagic anemia                           K92.1, Melena (includes Hematochezia) CPT copyright 2022 American Medical Association. All rights reserved. The codes documented in this report are preliminary and upon coder review may  be revised to meet current compliance requirements. Wilhemina Bonito. Marina Goodell, MD 07/25/2023 11:26:08 AM This report has been signed electronically. Number of Addenda: 0

## 2023-07-25 NOTE — Interval H&P Note (Signed)
 History and Physical Interval Note:  07/25/2023 10:26 AM  Cindy Padilla  has presented today for surgery, with the diagnosis of melena with anemia.  The various methods of treatment have been discussed with the patient and family. After consideration of risks, benefits and other options for treatment, the patient has consented to  Procedure(s): ESOPHAGOGASTRODUODENOSCOPY (EGD) WITH PROPOFOL (N/A) as a surgical intervention.  The patient's history has been reviewed, patient examined, no change in status, stable for surgery.  I have reviewed the patient's chart and labs.  Questions were answered to the patient's satisfaction.     [name]

## 2023-07-25 NOTE — Plan of Care (Signed)
 ?  Problem: Clinical Measurements: ?Goal: Ability to maintain clinical measurements within normal limits will improve ?Outcome: Progressing ?Goal: Will remain free from infection ?Outcome: Progressing ?Goal: Diagnostic test results will improve ?Outcome: Progressing ?  ?

## 2023-07-25 NOTE — Progress Notes (Signed)
 At bedside for PICC assessment. Unable to flush both lumens, despite repositioning. CXR ordered at this time per Dr. Nelson Chimes.

## 2023-07-26 DIAGNOSIS — J159 Unspecified bacterial pneumonia: Secondary | ICD-10-CM | POA: Diagnosis not present

## 2023-07-26 LAB — GLUCOSE, CAPILLARY
Glucose-Capillary: 86 mg/dL (ref 70–99)
Glucose-Capillary: 100 mg/dL — ABNORMAL HIGH (ref 70–99)
Glucose-Capillary: 93 mg/dL (ref 70–99)
Glucose-Capillary: 100 mg/dL — ABNORMAL HIGH (ref 70–99)

## 2023-07-26 LAB — CBC
HCT: 26.6 % — ABNORMAL LOW (ref 36.0–46.0)
Hemoglobin: 8.2 g/dL — ABNORMAL LOW (ref 12.0–15.0)
MCH: 29.7 pg (ref 26.0–34.0)
MCHC: 30.8 g/dL (ref 30.0–36.0)
MCV: 96.4 fL (ref 80.0–100.0)
Platelets: 218 10*3/uL (ref 150–400)
RBC: 2.76 MIL/uL — ABNORMAL LOW (ref 3.87–5.11)
RDW: 18.6 % — ABNORMAL HIGH (ref 11.5–15.5)
WBC: 6 10*3/uL (ref 4.0–10.5)
nRBC: 0 % (ref 0.0–0.2)

## 2023-07-26 LAB — BASIC METABOLIC PANEL
Anion gap: 8 (ref 5–15)
BUN: 16 mg/dL (ref 8–23)
CO2: 21 mmol/L — ABNORMAL LOW (ref 22–32)
Calcium: 7.6 mg/dL — ABNORMAL LOW (ref 8.9–10.3)
Chloride: 106 mmol/L (ref 98–111)
Creatinine, Ser: 0.86 mg/dL (ref 0.44–1.00)
GFR, Estimated: >60 mL/min (ref >60)
Glucose, Bld: 102 mg/dL — ABNORMAL HIGH (ref 70–99)
Potassium: 3.7 mmol/L (ref 3.5–5.1)
Sodium: 135 mmol/L (ref 135–145)

## 2023-07-26 LAB — MAGNESIUM: Magnesium: 1.9 mg/dL (ref 1.7–2.4)

## 2023-07-26 MED ORDER — LAMOTRIGINE 100 MG PO TABS: 100.0000 mg | ORAL_TABLET | Freq: Two times a day (BID) | ORAL | Status: AC

## 2023-07-26 MED ORDER — PANTOPRAZOLE SODIUM 40 MG PO TBEC: 40.0000 mg | DELAYED_RELEASE_TABLET | Freq: Two times a day (BID) | ORAL | Status: AC

## 2023-07-26 MED ORDER — QUETIAPINE FUMARATE 50 MG PO TABS: 50.0000 mg | ORAL_TABLET | Freq: Every day | ORAL | Status: AC

## 2023-07-26 NOTE — Progress Notes (Signed)
 PROGRESS NOTE    Cindy Padilla  WUJ:[id number] DOB: May 19, 1941 DOA: 07/15/2023 PCP: Valere Dross, FNP    Brief Narrative:   83 year old female, with past medical history significant for dementia, HTN, HLD, CAD, COPD, mood disorder who presented to [facility] ED on 2/18 via EMS from [facility] memory care facility with confusion, progressive decline in activity over the preceding week.  Recently diagnosed with ESBL UTI and influenza.  Now admitted for failure to thrive causing electrolyte imbalance, encephalopathy and Hemoccult stool.  [facility] consulted, planning endoscopy.  EGD 2/28= Nonbleeding ulcer, PPI BID x 2 months thereafter daily.    Assessment & Plan:  Principal Problem:   Community acquired bacterial pneumonia Active Problems:   Sepsis (HCC)    Acute metabolic encephalopathy:  Overall improving.  Likely from multiple underlying ongoing issue including infection from ESBL UTI BS ok, Ammonia is normal.  CT head showed chronic changes.  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     Severe sepsis due to ESBL UTI, POA Sepsis physiology is improving.  Urine cultures from 2/18 reviewed.  Showing ESBL -Completed 5 days of ertapenem 2/18 - 2/22  Acute hypoxia, improved - As needed bronchodilators.  Now on room air   Anemia, likely acute blood loss anemia Macrocytosis Baseline hemoglobin 11, admission hemoglobin 7.8 with positive Hemoccult stool.  Currently on PPI twice daily and Carafate - EGD 2021 showed esophagitis and Sheria Lang lesion - EGD 2/28= Nonbleeding ulcer, PPI BID x 2 months thereafter daily.    Bilateral lower extremity cellulitis Left lower extremity necrotic wound -Left shin wounds from wheelchair incident per family as another residents of the facility ran into her leg a few weeks ago. -Due to ongoing concerns of this currently on IV Linezolid and Rocephin  (total 14 days; EOT 3/4) -Seen  by wound care team   Acute renal failure: Resolved -Baseline creatinine 0.6, admission creatinine 1.6   Dysphagia -Continue dysphagia 2 diet   Recent influenza A viral infection Originally diagnosed with influenza A on 07/04/2023; completed course of Tamiflu at facility.   Hyperkalemia: Resolved        Hypokalemia/hypophosphatemia -As needed repletion   Acute urinary retention Foley catheter was placed in the ED for acute urinary retention. -will attempt foley removal prior to dc; if not able, will replace it and have outptn Urology follow up.     Pleural effusion Severe aortic stenosis Reviewed TTE from December 2024 with preserved LVEF, grade 1 diastolic dysfunction, large pleural effusion and severe aortic stenosis.  Patient is asymptomatic, no chest pain, no tamponade physiology.  Has outpatient appointment coming up with cardiology, Dr. Odis Hollingshead on 08/11/2023   Essential hypertension -Amlodipine 5 mg p.o. daily. Iv prn   Hyperlipidemia -Atorvastatin 20mg  p.o. daily   CAD Lipitor continue atorvastatin 20 mg p.o. daily -Plavix on hold   COPD:  -Bronchodilators   Dementia Mood disorder Adult failure to thrive -Cont bedtime Seroquel, dc am Seroquel. Lamictal reduced 150mg  bid (used to be on 200mg )= bid)   Weakness/deconditioning/gait disturbance/debility: Patient residing at [facility] memory care facility.  Most recent baseline able to ambulate with the use of a walker but since has been utilizing a wheelchair due to acute decline with recent diagnosis of influenza A on 2/7. -PT/OT evaluation  PICC placed on 2/26 due to lack of IV Access. Will be removed prior to dc.      DVT prophylaxis: SCDs Start: 07/15/23  2126  DNR/DNI Family Communication: Clide Cliff Dc On Monday per TOC.  Subjective: No complaints feeling better  Examination:  General exam: Appears calm and comfortable, cachectic frail Respiratory system: Clear to auscultation. Respiratory effort  normal. Cardiovascular system: S1 & S2 heard, RRR. No JVD, murmurs, rubs, gallops or clicks. No pedal edema. Gastrointestinal system: Abdomen is nondistended, soft and nontender. No organomegaly or masses felt. Normal bowel sounds heard. Central nervous system: Alert and oriented. No focal neurological deficits. Extremities: Symmetric 4 x 5 power. Skin: No rashes, lesions or ulcers Psychiatry: Judgement and insight appear poor foley           Pressure Injury Sacrum Right;Left;Mid Stage 1 -  Intact skin with non-blanchable redness of a localized area usually over a bony prominence. (Active)     Location: Sacrum  Location Orientation: Right;Left;Mid  Staging: Stage 1 -  Intact skin with non-blanchable redness of a localized area usually over a bony prominence.  Wound Description (Comments):   Present on Admission:      Diet Orders (From admission, onward)     Start     Ordered   07/25/23 1201  DIET DYS 2 Room service appropriate? Yes; Fluid consistency: Thin  Diet effective now       Question Answer Comment  Room service appropriate? Yes   Fluid consistency: Thin      07/25/23 1200            Objective: Vitals:   07/25/23 2230 07/26/23 0408 07/26/23 0921 07/26/23 1024  BP: (!) 144/57 (!) 155/56    Pulse: 76 78    Resp: 16 18    Temp: 98.2 F (36.8 C) 98.2 F (36.8 C)    TempSrc: Oral Oral    SpO2: 100% 100% 100% 100%  Weight:      Height:        Intake/Output Summary (Last 24 hours) at 07/26/2023 1144 Last data filed at 07/26/2023 0848 Gross per 24 hour  Intake 1182.65 ml  Output 1750 ml  Net -567.35 ml   Filed Weights   07/16/23 0000 07/16/23 0330  Weight: 70.3 kg 63.3 kg    Scheduled Meds:  amLODipine  5 mg Oral Daily   atorvastatin  20 mg Oral QPM   Chlorhexidine Gluconate Cloth  6 each Topical Daily   donepezil  10 mg Oral QHS   fluticasone furoate-vilanterol  1 puff Inhalation Daily   lamoTRIgine  100 mg Oral BID   montelukast  10 mg Oral  QHS   pantoprazole  40 mg Oral BID AC   potassium chloride  40 mEq Oral Once   QUEtiapine  50 mg Oral QHS   sodium chloride flush  10-40 mL Intracatheter Q12H   sucralfate  1 g Oral Q6H   umeclidinium bromide  1 puff Inhalation Daily   Continuous Infusions:  cefTRIAXone (ROCEPHIN)  IV     linezolid (ZYVOX) IV 600 mg (07/26/23 0834)    Nutritional status     Body mass index is 27.25 kg/m.  Data Reviewed:   CBC: Recent Labs  Lab 07/22/23 0317 07/23/23 0323 07/24/23 0329 07/25/23 0213 07/26/23 0424  WBC 4.2 5.3 4.3 5.7 6.0  HGB 7.9* 7.6* 7.4* 8.1* 8.2*  HCT 26.9* 25.1* 24.7* 26.9* 26.6*  MCV 101.9* 100.4* 101.2* 99.3 96.4  PLT 222 213 188 199 218   Basic Metabolic Panel: Recent Labs  Lab 07/20/23 0143 07/22/23 0317 07/23/23 0323 07/24/23 0329 07/25/23 0213 07/26/23 0424  NA 142 142 143 137  137 135  K 3.8 3.6 4.0 3.4* 3.6 3.7  CL 113* 113* 114* 110 108 106  CO2 25 23 22  21* 23 21*  GLUCOSE 89 122* 89 109* 111* 102*  BUN 11 12 13 14 14 16   CREATININE 0.64 0.69 0.69 0.89 0.92 0.86  CALCIUM 7.7* 7.9* 7.6* 7.4* 7.7* 7.6*  MG 2.1 2.1 2.1 2.1 2.1 1.9  PHOS 2.5 2.3*  --   --   --   --    GFR: Estimated Creatinine Clearance: 41.2 mL/min (by C-G formula based on SCr of 0.86 mg/dL). Liver Function Tests: Recent Labs  Lab 07/20/23 0143  ALBUMIN 1.5*   No results for input(s): "LIPASE", "AMYLASE" in the last 168 hours. Recent Labs  Lab 07/21/23 1050  AMMONIA 22   Coagulation Profile: No results for input(s): "INR", "PROTIME" in the last 168 hours. Cardiac Enzymes: No results for input(s): "CKTOTAL", "CKMB", "CKMBINDEX", "TROPONINI" in the last 168 hours. BNP (last 3 results) No results for input(s): "PROBNP" in the last 8760 hours. HbA1C: No results for input(s): "HGBA1C" in the last 72 hours. CBG: Recent Labs  Lab 07/25/23 0822 07/25/23 1154 07/25/23 1605 07/25/23 2250 07/26/23 0754  GLUCAP 89 73 74 99 86   Lipid Profile: No results for  input(s): "CHOL", "HDL", "LDLCALC", "TRIG", "CHOLHDL", "LDLDIRECT" in the last 72 hours. Thyroid Function Tests: No results for input(s): "TSH", "T4TOTAL", "FREET4", "T3FREE", "THYROIDAB" in the last 72 hours. Anemia Panel: No results for input(s): "VITAMINB12", "FOLATE", "FERRITIN", "TIBC", "IRON", "RETICCTPCT" in the last 72 hours. Sepsis Labs: No results for input(s): "PROCALCITON", "LATICACIDVEN" in the last 168 hours.  No results found for this or any previous visit (from the past 240 hours).       Radiology Studies: [place] EKG SITE RITE Result Date: 07/25/2023 If [facility] image not attached, placement could not be confirmed due to current cardiac rhythm.  DG CHEST PORT 1 VIEW Result Date: 07/25/2023 CLINICAL DATA:  PICC placement. EXAM: PORTABLE CHEST 1 VIEW COMPARISON:  Chest radiograph dated 07/23/2023. FINDINGS: Right-sided PICC with tip over the right clavicular head. Recommend further advancing for optimal positioning. Shallow inspiration with bibasilar atelectasis. No focal consolidation, pleural effusion, or pneumothorax. The cardiac silhouette is within normal limits. Atherosclerotic calcification of the aorta. Hypoglossal stimulator or the left chest. IMPRESSION: 1. Right-sided PICC with tip over the right clavicular head. Recommend further advancing for optimal positioning. 2. Shallow inspiration with bibasilar atelectasis. Electronically Signed   By: Elgie Collard M.D.   On: 07/25/2023 14:10           LOS: 10 days   Time spent= 35 mins    Miguel Rota, MD [facility]  If 7PM-7AM, please contact night-coverage  07/26/2023, 11:44 AM

## 2023-07-26 NOTE — Plan of Care (Signed)

## 2023-07-26 NOTE — Plan of Care (Signed)
   Problem: Coping: Goal: Level of anxiety will decrease Outcome: Progressing   Problem: Pain Managment: Goal: General experience of comfort will improve and/or be controlled Outcome: Progressing   Problem: Safety: Goal: Ability to remain free from injury will improve Outcome: Progressing

## 2023-07-27 DIAGNOSIS — J159 Unspecified bacterial pneumonia: Secondary | ICD-10-CM | POA: Diagnosis not present

## 2023-07-27 LAB — BASIC METABOLIC PANEL
Anion gap: 5 (ref 5–15)
BUN: 19 mg/dL (ref 8–23)
CO2: 22 mmol/L (ref 22–32)
Calcium: 7.6 mg/dL — ABNORMAL LOW (ref 8.9–10.3)
Chloride: 107 mmol/L (ref 98–111)
Creatinine, Ser: 1.05 mg/dL — ABNORMAL HIGH (ref 0.44–1.00)
GFR, Estimated: 53 mL/min — ABNORMAL LOW (ref >60)
Glucose, Bld: 91 mg/dL (ref 70–99)
Potassium: 3.5 mmol/L (ref 3.5–5.1)
Sodium: 134 mmol/L — ABNORMAL LOW (ref 135–145)

## 2023-07-27 LAB — GLUCOSE, CAPILLARY
Glucose-Capillary: 74 mg/dL (ref 70–99)
Glucose-Capillary: 92 mg/dL (ref 70–99)
Glucose-Capillary: 83 mg/dL (ref 70–99)
Glucose-Capillary: 90 mg/dL (ref 70–99)

## 2023-07-27 LAB — CBC
HCT: 26 % — ABNORMAL LOW (ref 36.0–46.0)
Hemoglobin: 8 g/dL — ABNORMAL LOW (ref 12.0–15.0)
MCH: 29.9 pg (ref 26.0–34.0)
MCHC: 30.8 g/dL (ref 30.0–36.0)
MCV: 97 fL (ref 80.0–100.0)
Platelets: 210 10*3/uL (ref 150–400)
RBC: 2.68 MIL/uL — ABNORMAL LOW (ref 3.87–5.11)
RDW: 18.6 % — ABNORMAL HIGH (ref 11.5–15.5)
WBC: 5.5 10*3/uL (ref 4.0–10.5)
nRBC: 0 % (ref 0.0–0.2)

## 2023-07-27 LAB — MAGNESIUM: Magnesium: 2 mg/dL (ref 1.7–2.4)

## 2023-07-27 MED ORDER — LINEZOLID 600 MG PO TABS
600.0000 mg | ORAL_TABLET | Freq: Two times a day (BID) | ORAL | Status: DC
  Administered 2023-07-27 – 2023-07-28 (×3): 600 mg via ORAL
  Filled 2023-07-27 (×5): qty 1

## 2023-07-27 NOTE — Plan of Care (Signed)

## 2023-07-27 NOTE — Plan of Care (Signed)
   Problem: Coping: Goal: Level of anxiety will decrease Outcome: Progressing   Problem: Pain Managment: Goal: General experience of comfort will improve and/or be controlled Outcome: Progressing   Problem: Safety: Goal: Ability to remain free from injury will improve Outcome: Progressing

## 2023-07-27 NOTE — Progress Notes (Signed)
 During routine central line care it was noted that this patient's right arm with PICC was slightly edematous.  PICC was functioning properly with purple lumen infusing and red lumen flushing easily and with good blood return.  Pt was leaning on that side, so I repositioned her and elevated right arm on a pillow.  Within 30 minutes, purple lumen was occluded and red lumen would flush with resistance and had sluggish blood return.  Provider and primary RN were notified.  After discussion via secure chat Dr Nelson Chimes placed an order to discontinue the PICC line.    The line was removed with no complications.  Pt was in supine position for removal and to remain for 30 minutes after removal.  Pressure held to achieve hemostasis.  Vaseline/gauze/tegaderm applied.

## 2023-07-27 NOTE — TOC Progression Note (Signed)
 Transition of Care [facility]) - Progression Note    Patient Details  Name: Cindy Padilla MRN: [id number] Date of Birth: 1940-05-30  Transition of Care [facility]) CM/SW Contact  Howell Rucks, RN Phone Number: 07/27/2023, 11:53 AM  Clinical Narrative:   NCM called to pt's dtr Cindy Padilla, to review additional bed offers for short term rehab/SNF [facility], [facility], [facility], [facility], [facility], [facility] ), Cindy Padilla request email list to her at : [email or web address]. TOC will continue to follow.     Expected Discharge Plan: Skilled Nursing Facility Barriers to Discharge: Continued Medical Work up, SNF Pending bed offer  Expected Discharge Plan and Services In-house Referral: Clinical Social Work   Post Acute Care Choice: Skilled Nursing Facility Living arrangements for the past 2 months:  (Memory Care at [facility])                 DME Arranged: N/A DME Agency: NA                   Social Determinants of Health (SDOH) Interventions SDOH Screenings   Food Insecurity: No Food Insecurity (05/24/2023)  Housing: Low Risk  (05/24/2023)  Transportation Needs: No Transportation Needs (05/24/2023)  Utilities: Not At Risk (05/24/2023)  Social Connections: Moderately Integrated (05/27/2023)  Tobacco Use: Medium Risk (07/25/2023)    Readmission Risk Interventions    07/17/2023    2:56 PM  Readmission Risk Prevention Plan  Transportation Screening Complete  PCP or Specialist Appt within 3-5 Days Complete  HRI or Home Care Consult Complete  Social Work Consult for Recovery Care Planning/Counseling Complete

## 2023-07-27 NOTE — Progress Notes (Signed)
 PROGRESS NOTE    Cindy Padilla  ZOX:[id number] DOB: 1941-02-23 DOA: 07/15/2023 PCP: Valere Dross, FNP    Brief Narrative:   83 year old female, with past medical history significant for dementia, HTN, HLD, CAD, COPD, mood disorder who presented to [facility] ED on 2/18 via EMS from [facility] memory care facility with confusion, progressive decline in activity over the preceding week.  Recently diagnosed with ESBL UTI and influenza.  Now admitted for failure to thrive causing electrolyte imbalance, encephalopathy and Hemoccult stool.  [facility] consulted, planning endoscopy.  EGD 2/28= Nonbleeding ulcer, PPI BID x 2 months thereafter daily.    Assessment & Plan:  Principal Problem:   Community acquired bacterial pneumonia Active Problems:   Sepsis (HCC)    Acute metabolic encephalopathy:  Overall improving.  Likely from multiple underlying ongoing issue including infection from ESBL UTI BS ok, Ammonia is normal.  CT head showed chronic changes.  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     Severe sepsis due to ESBL UTI, POA Sepsis physiology is improving.  Urine cultures from 2/18 reviewed.  Showing ESBL -Completed 5 days of ertapenem 2/18 - 2/22  Acute hypoxia, improved - As needed bronchodilators.  Now on room air   Anemia, likely acute blood loss anemia Macrocytosis Baseline hemoglobin 11, admission hemoglobin 7.8 with positive Hemoccult stool.  Currently on PPI twice daily and Carafate - EGD 2021 showed esophagitis and Sheria Lang lesion - EGD 2/28= Nonbleeding ulcer, PPI BID x 2 months thereafter daily.    Bilateral lower extremity cellulitis Left lower extremity necrotic wound -Left shin wounds from wheelchair incident per family as another residents of the facility ran into her leg a few weeks ago. -Due to ongoing concerns of this currently on IV Linezolid and Rocephin  (total 14 days; EOT 3/4) -Seen  by wound care team   Acute renal failure: Resolved -Baseline creatinine 0.6, admission creatinine 1.6   Dysphagia -Continue dysphagia 2 diet   Recent influenza A viral infection Originally diagnosed with influenza A on 07/04/2023; completed course of Tamiflu at facility.   Hyperkalemia: Resolved        Hypokalemia/hypophosphatemia -As needed repletion   Acute urinary retention Foley removed on 3/1 but due to persistent retention, placed again on 3/2 Will need outptn urology follow up   Pleural effusion Severe aortic stenosis Reviewed TTE from December 2024 with preserved LVEF, grade 1 diastolic dysfunction, large pleural effusion and severe aortic stenosis.  Patient is asymptomatic, no chest pain, no tamponade physiology.  Has outpatient appointment coming up with cardiology, Dr. Odis Hollingshead on 08/11/2023   Essential hypertension -Amlodipine 5 mg p.o. daily. Iv prn   Hyperlipidemia -Atorvastatin 20mg  p.o. daily   CAD Lipitor continue atorvastatin 20 mg p.o. daily -Plavix on hold   COPD:  -Bronchodilators   Dementia Mood disorder Adult failure to thrive -Cont bedtime Seroquel, dc am Seroquel. Lamictal reduced 150mg  bid (used to be on 200mg )= bid)   Weakness/deconditioning/gait disturbance/debility: Patient residing at [facility] memory care facility.  Most recent baseline able to ambulate with the use of a walker but since has been utilizing a wheelchair due to acute decline with recent diagnosis of influenza A on 2/7. -PT/OT evaluation  PICC placed on 2/26 due to lack of IV Access. Will be removed prior to dc.      DVT prophylaxis: SCDs Start: 07/15/23 2126  DNR/DNI Family Communication: Clide Cliff Dc On Monday per TOC.  Subjective: No complaints feeling better Foley has to reinserted  Examination:  General exam: Appears calm and comfortable, cachectic frail Respiratory system: Clear to auscultation. Respiratory effort normal. Cardiovascular system: S1 &  S2 heard, RRR. No JVD, murmurs, rubs, gallops or clicks. No pedal edema. Gastrointestinal system: Abdomen is nondistended, soft and nontender. No organomegaly or masses felt. Normal bowel sounds heard. Central nervous system: Alert and oriented. No focal neurological deficits. Extremities: Symmetric 4 x 5 power. Skin: No rashes, lesions or ulcers Psychiatry: Judgement and insight appear poor foley           Pressure Injury Sacrum Right;Left;Mid Stage 1 -  Intact skin with non-blanchable redness of a localized area usually over a bony prominence. (Active)     Location: Sacrum  Location Orientation: Right;Left;Mid  Staging: Stage 1 -  Intact skin with non-blanchable redness of a localized area usually over a bony prominence.  Wound Description (Comments):   Present on Admission:      Diet Orders (From admission, onward)     Start     Ordered   07/25/23 1201  DIET DYS 2 Room service appropriate? Yes; Fluid consistency: Thin  Diet effective now       Question Answer Comment  Room service appropriate? Yes   Fluid consistency: Thin      07/25/23 1200            Objective: Vitals:   07/26/23 1024 07/26/23 1322 07/26/23 2345 07/27/23 0533  BP:  (!) 140/81 (!) 147/62 (!) 153/62  Pulse:  82 83 73  Resp:  16 17 18   Temp:  98.1 F (36.7 C) 98 F (36.7 C) 97.6 F (36.4 C)  TempSrc:  Oral    SpO2: 100% 97% 96% 96%  Weight:      Height:        Intake/Output Summary (Last 24 hours) at 07/27/2023 1009 Last data filed at 07/27/2023 1004 Gross per 24 hour  Intake 1239.87 ml  Output 0 ml  Net 1239.87 ml   Filed Weights   07/16/23 0000 07/16/23 0330  Weight: 70.3 kg 63.3 kg    Scheduled Meds:  amLODipine  5 mg Oral Daily   atorvastatin  20 mg Oral QPM   Chlorhexidine Gluconate Cloth  6 each Topical Daily   donepezil  10 mg Oral QHS   fluticasone furoate-vilanterol  1 puff Inhalation Daily   lamoTRIgine  100 mg Oral BID   linezolid  600 mg Oral Q12H   montelukast   10 mg Oral QHS   pantoprazole  40 mg Oral BID AC   potassium chloride  40 mEq Oral Once   QUEtiapine  50 mg Oral QHS   sodium chloride flush  10-40 mL Intracatheter Q12H   sucralfate  1 g Oral Q6H   umeclidinium bromide  1 puff Inhalation Daily   Continuous Infusions:  cefTRIAXone (ROCEPHIN)  IV 2 g (07/26/23 1713)    Nutritional status     Body mass index is 27.25 kg/m.  Data Reviewed:   CBC: Recent Labs  Lab 07/23/23 0323 07/24/23 0329 07/25/23 0213 07/26/23 0424 07/27/23 0520  WBC 5.3 4.3 5.7 6.0 5.5  HGB 7.6* 7.4* 8.1* 8.2* 8.0*  HCT 25.1* 24.7* 26.9* 26.6* 26.0*  MCV 100.4* 101.2* 99.3 96.4 97.0  PLT 213 188 199 218 210   Basic Metabolic Panel: Recent Labs  Lab 07/22/23 0317 07/23/23 0323 07/24/23 0329 07/25/23 0213 07/26/23 0424 07/27/23 0520  NA 142 143 137 137 135 134*  K  3.6 4.0 3.4* 3.6 3.7 3.5  CL 113* 114* 110 108 106 107  CO2 23 22 21* 23 21* 22  GLUCOSE 122* 89 109* 111* 102* 91  BUN 12 13 14 14 16 19   CREATININE 0.69 0.69 0.89 0.92 0.86 1.05*  CALCIUM 7.9* 7.6* 7.4* 7.7* 7.6* 7.6*  MG 2.1 2.1 2.1 2.1 1.9 2.0  PHOS 2.3*  --   --   --   --   --    GFR: Estimated Creatinine Clearance: 33.7 mL/min (A) (by C-G formula based on SCr of 1.05 mg/dL (H)). Liver Function Tests: No results for input(s): "AST", "ALT", "ALKPHOS", "BILITOT", "PROT", "ALBUMIN" in the last 168 hours. No results for input(s): "LIPASE", "AMYLASE" in the last 168 hours. Recent Labs  Lab 07/21/23 1050  AMMONIA 22   Coagulation Profile: No results for input(s): "INR", "PROTIME" in the last 168 hours. Cardiac Enzymes: No results for input(s): "CKTOTAL", "CKMB", "CKMBINDEX", "TROPONINI" in the last 168 hours. BNP (last 3 results) No results for input(s): "PROBNP" in the last 8760 hours. HbA1C: No results for input(s): "HGBA1C" in the last 72 hours. CBG: Recent Labs  Lab 07/26/23 0754 07/26/23 1208 07/26/23 1624 07/26/23 2158 07/27/23 0719  GLUCAP 86 100* 93 100*  74   Lipid Profile: No results for input(s): "CHOL", "HDL", "LDLCALC", "TRIG", "CHOLHDL", "LDLDIRECT" in the last 72 hours. Thyroid Function Tests: No results for input(s): "TSH", "T4TOTAL", "FREET4", "T3FREE", "THYROIDAB" in the last 72 hours. Anemia Panel: No results for input(s): "VITAMINB12", "FOLATE", "FERRITIN", "TIBC", "IRON", "RETICCTPCT" in the last 72 hours. Sepsis Labs: No results for input(s): "PROCALCITON", "LATICACIDVEN" in the last 168 hours.  No results found for this or any previous visit (from the past 240 hours).       Radiology Studies: [place] EKG SITE RITE Result Date: 07/25/2023 If [facility] image not attached, placement could not be confirmed due to current cardiac rhythm.  DG CHEST PORT 1 VIEW Result Date: 07/25/2023 CLINICAL DATA:  PICC placement. EXAM: PORTABLE CHEST 1 VIEW COMPARISON:  Chest radiograph dated 07/23/2023. FINDINGS: Right-sided PICC with tip over the right clavicular head. Recommend further advancing for optimal positioning. Shallow inspiration with bibasilar atelectasis. No focal consolidation, pleural effusion, or pneumothorax. The cardiac silhouette is within normal limits. Atherosclerotic calcification of the aorta. Hypoglossal stimulator or the left chest. IMPRESSION: 1. Right-sided PICC with tip over the right clavicular head. Recommend further advancing for optimal positioning. 2. Shallow inspiration with bibasilar atelectasis. Electronically Signed   By: Elgie Collard M.D.   On: 07/25/2023 14:10           LOS: 11 days   Time spent= 35 mins    Miguel Rota, MD [facility]  If 7PM-7AM, please contact night-coverage  07/27/2023, 10:09 AM

## 2023-07-28 ENCOUNTER — Encounter (HOSPITAL_COMMUNITY): Payer: Self-pay | Admitting: Internal Medicine

## 2023-07-28 DIAGNOSIS — J159 Unspecified bacterial pneumonia: Secondary | ICD-10-CM | POA: Diagnosis not present

## 2023-07-28 LAB — BASIC METABOLIC PANEL
Anion gap: 8 (ref 5–15)
BUN: 21 mg/dL (ref 8–23)
CO2: 19 mmol/L — ABNORMAL LOW (ref 22–32)
Calcium: 7.6 mg/dL — ABNORMAL LOW (ref 8.9–10.3)
Chloride: 108 mmol/L (ref 98–111)
Creatinine, Ser: 1.18 mg/dL — ABNORMAL HIGH (ref 0.44–1.00)
GFR, Estimated: 46 mL/min — ABNORMAL LOW (ref >60)
Glucose, Bld: 77 mg/dL (ref 70–99)
Potassium: 3.8 mmol/L (ref 3.5–5.1)
Sodium: 135 mmol/L (ref 135–145)

## 2023-07-28 LAB — GLUCOSE, CAPILLARY
Glucose-Capillary: 77 mg/dL (ref 70–99)
Glucose-Capillary: 75 mg/dL (ref 70–99)
Glucose-Capillary: 68 mg/dL — ABNORMAL LOW (ref 70–99)
Glucose-Capillary: 73 mg/dL (ref 70–99)
Glucose-Capillary: 87 mg/dL (ref 70–99)
Glucose-Capillary: 92 mg/dL (ref 70–99)
Glucose-Capillary: 83 mg/dL (ref 70–99)

## 2023-07-28 LAB — CBC
HCT: 26.8 % — ABNORMAL LOW (ref 36.0–46.0)
Hemoglobin: 8.2 g/dL — ABNORMAL LOW (ref 12.0–15.0)
MCH: 30.3 pg (ref 26.0–34.0)
MCHC: 30.6 g/dL (ref 30.0–36.0)
MCV: 98.9 fL (ref 80.0–100.0)
Platelets: 176 10*3/uL (ref 150–400)
RBC: 2.71 MIL/uL — ABNORMAL LOW (ref 3.87–5.11)
RDW: 18.5 % — ABNORMAL HIGH (ref 11.5–15.5)
WBC: 6.4 10*3/uL (ref 4.0–10.5)
nRBC: 0 % (ref 0.0–0.2)

## 2023-07-28 LAB — MAGNESIUM: Magnesium: 2.1 mg/dL (ref 1.7–2.4)

## 2023-07-28 LAB — SURGICAL PATHOLOGY

## 2023-07-28 MED ORDER — CEFADROXIL 500 MG PO CAPS: 500.0000 mg | ORAL_CAPSULE | Freq: Two times a day (BID) | ORAL | Status: AC

## 2023-07-28 MED ORDER — DEXTROSE 50 % IV SOLN: 1.0000 | Freq: Once | INTRAVENOUS | Status: DC

## 2023-07-28 MED ORDER — CLOPIDOGREL BISULFATE 75 MG PO TABS
75.0000 mg | ORAL_TABLET | Freq: Every day | ORAL | Status: DC
  Administered 2023-07-28: 75 mg via ORAL
  Filled 2023-07-28 (×2): qty 1

## 2023-07-28 MED ORDER — SODIUM CHLORIDE 0.9 % IV BOLUS
500.0000 mL | Freq: Once | INTRAVENOUS | Status: AC
  Administered 2023-07-28: 500 mL via INTRAVENOUS

## 2023-07-28 MED ORDER — TORSEMIDE 20 MG PO TABS: 60.0000 mg | ORAL_TABLET | Freq: Every day | ORAL | Status: DC | PRN

## 2023-07-28 MED ORDER — LINEZOLID 600 MG PO TABS: 600.0000 mg | ORAL_TABLET | Freq: Two times a day (BID) | ORAL | Status: AC

## 2023-07-28 NOTE — Progress Notes (Signed)

## 2023-07-28 NOTE — Plan of Care (Signed)
  Problem: Safety: Goal: Ability to remain free from injury will improve Outcome: Progressing   Problem: Pain Managment: Goal: General experience of comfort will improve and/or be controlled Outcome: Progressing   Problem: Nutrition: Goal: Adequate nutrition will be maintained Outcome: Progressing

## 2023-07-28 NOTE — Progress Notes (Signed)
 Occupational Therapy Treatment Patient Details Name: Cindy Padilla MRN: [id number] DOB: 1940-07-21 Today's Date: 07/28/2023   History of present illness Patient is a 83 year old female who presented from memory care unit  with weakness, CAP on 07/15/23. Patient was noted to have been diagnosed with influenza and UTI 07/04/23 .patient was admitted with acute encephalopathy, severe sepsis secondary to UTI and AKI.   PMH: bipolar, dementia, DM, GERD, IBS, HTN, COPD.   OT comments  OT to sign off at this time. Patietn has had a significant decline in ability to engage in Adls since initial OT evaluation with continuing to decline with each session. Patient has no rehab potential with limited ability to follow directions. Patient is resistive to movements of BUE with limited communication. Patient is unable to manage secretions with suction needed during session with secretions in mouth after cough. Patient needs LTC. Patient and family would benefit from GOC discussion at this time. OT to sign off.       If plan is discharge home, recommend the following:  Two people to help with walking and/or transfers;Assistance with cooking/housework;Direct supervision/assist for medications management;Assist for transportation;Help with stairs or ramp for entrance;Direct supervision/assist for financial management;Assistance with feeding;Supervision due to cognitive status;Two people to help with bathing/dressing/bathroom   Equipment Recommendations  None recommended by OT    Recommendations for Other Services      Precautions / Restrictions Precautions Precautions: Fall Recall of Precautions/Restrictions: Impaired Restrictions Weight Bearing Restrictions Per Provider Order: No Other Position/Activity Restrictions: wound on LLE/cellulitis/painful       Mobility Bed Mobility               General bed mobility comments: TD for positioning in bed in chair position. resistive to movements.              ADL either performed or assessed with clinical judgement   ADL Overall ADL's : Needs assistance/impaired       General ADL Comments: patient moved to chair position in bed with patietn resistive to engagement in movements of UE. patient keeping LUE in flexion at elbow with hand resting on clavicle on R side. on L side patient resistive to movement with noted edema in proximal forearm and upper arm. patient not engaged in attempts to sit upright in bed or engage in trunk control activity. patient unable to report her name during session.      Cognition Arousal: Alert Behavior During Therapy: Flat affect Cognition: Cognition impaired   Orientation impairments: Place, Time, Situation Awareness: Intellectual awareness impaired, Online awareness impaired Memory impairment (select all impairments): Short-term memory, Working [employer] fast streamer, Conservation officer, historic buildings, Non-declarative long-term memory Attention impairment (select first level of impairment): Focused attention Executive functioning impairment (select all impairments): Initiation, Organization, Sequencing, Reasoning, Problem solving OT - Cognition Comments: patient responded with a few yes nos but unable to engage in meaningufl conversations. resistive to movements.                 Following commands: Impaired                      Pertinent Vitals/ Pain       Pain Assessment Pain Assessment: Faces Faces Pain Scale: Hurts even more Pain Location: LEs and UEs with movement Pain Descriptors / Indicators: Grimacing, Guarding, Moaning Pain Intervention(s): Limited activity within patient's tolerance, Monitored during session, Repositioned         Frequency  Progress Toward Goals  OT Goals(current goals can now be found in the care plan section)  Progress towards OT goals: Not progressing toward goals - comment (OT to sign off today)     Plan         AM-PAC OT "6 Clicks" Daily  Activity     Outcome Measure   Help from another person eating meals?: Total Help from another person taking care of personal grooming?: Total Help from another person toileting, which includes using toliet, bedpan, or urinal?: Total Help from another person bathing (including washing, rinsing, drying)?: Total Help from another person to put on and taking off regular upper body clothing?: Total Help from another person to put on and taking off regular lower body clothing?: Total 6 Click Score: 6    End of Session    OT Visit Diagnosis: Unsteadiness on feet (R26.81);Pain;Muscle weakness (generalized) (M62.81);Other symptoms and signs involving cognitive function   Activity Tolerance Patient limited by pain   Patient Left in bed;with call bell/phone within reach;with bed alarm set   Nurse Communication Other (comment) (concerns over secretions and decline in engagement in tasks.)        Time: 1207-1218 OT Time Calculation (min): 11 min  Charges: OT General Charges $OT Visit: 1 Visit OT Treatments $Self Care/Home Management : 8-22 mins  Rosalio Loud, MS Acute Rehabilitation Department Office# [phone]   Selinda Flavin 07/28/2023, 12:27 PM

## 2023-07-28 NOTE — Progress Notes (Signed)
 30 Day PASRR Note   Patient Details  Name: Cindy Padilla Date of Birth: [birth year]   Transition of Care [facility]) CM/SW Contact:    Amada Jupiter, LCSW Phone Number: 07/28/2023, 12:30 PM  To Whom It May Concern:  Please be advised that this patient will require a short-term nursing home stay - anticipated 30 days or less for rehabilitation and strengthening.   The plan is for return home.

## 2023-07-28 NOTE — Discharge Summary (Signed)
 Physician Discharge Summary  Cindy Padilla:[id number] DOB: [birth year] DOA: 07/15/2023  PCP: Valere Dross, FNP  Admit date: 07/15/2023 Discharge date: 07/29/23  Admitted From: SNF Disposition: LTC  Recommendations for Outpatient Follow-up:  Follow up with PCP in 1-2 weeks Please obtain BMP/CBC in one week your next doctors visit.  Completed course of antibiotics in the hospital. PPI twice daily for 2 months followed by daily indefinitely Okay to resume Plavix per GI. Discontinue morning Seroquel and reduced Lamictal 100 mg twice daily.  Please continue to assess her mentation and taper off as appropriate Change torsemide to as needed.  Very poor oral intake.  If her oral intake improves, this can be transition to daily as needed Foley cath inserted due to recurrent retention.  Voiding trial in 1 week and follow-up outpatient urology   Discharge Condition: Stable CODE STATUS: DNR Diet recommendation: Heart healthy  Brief/Interim Summary: Brief Narrative:   83 year old female, with past medical history significant for dementia, HTN, HLD, CAD, COPD, mood disorder who presented to [facility] ED on 2/18 via EMS from [facility] memory care facility with confusion, progressive decline in activity over the preceding week.  Recently diagnosed with ESBL UTI and influenza.  Now admitted for failure to thrive causing electrolyte imbalance, encephalopathy and Hemoccult stool.  Eventually endoscopy showed nonbleeding ulcer therefore recommended PPI twice daily for 2 months followed by daily.  During the hospitalization due to her drowsiness, morning Seroquel was discontinued, Lamictal dose was reduced.  Her mentation slowly improved.  Hemoglobin remained stable.  Daughter updated throughout the hospital stay.  Foley catheter placed due to acute urinary retention, will need to follow-up outpatient urology.  Medically stable for discharge today.  Unfortunately eventually  SNF was declined by her insurance on 3/3 therefore arrangements for LTC were made for 07/29/2023.     Assessment & Plan:  Principal Problem:   Community acquired bacterial pneumonia Active Problems:   Sepsis (HCC)    Acute metabolic encephalopathy:  Overall improving.  Likely from multiple underlying ongoing issue including infection from ESBL UTI BS ok, Ammonia is normal.  CT head showed chronic changes.  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     Severe sepsis due to ESBL UTI, POA Sepsis physiology is improving.  Urine cultures from 2/18 reviewed.  Showing ESBL -Completed 5 days of ertapenem 2/18 - 2/22  Acute hypoxia, improved - As needed bronchodilators.  Now on room air   Anemia, likely acute blood loss anemia Macrocytosis Baseline hemoglobin 11, admission hemoglobin 7.8 with positive Hemoccult stool.  Currently on PPI twice daily and Carafate - EGD 2021 showed esophagitis and Sheria Lang lesion - EGD 2/28= Nonbleeding ulcer, PPI BID x 2 months thereafter daily.    Bilateral lower extremity cellulitis Left lower extremity necrotic wound -Left shin wounds from wheelchair incident per family as another residents of the facility ran into her leg a few weeks ago. -Due to ongoing concerns of this currently on IV Linezolid and Rocephin  (total 14 days; EOT 3/4) -Seen by wound care team   Acute renal failure: Resolved - Baseline creatinine around 0.8.   Dysphagia -Continue dysphagia 2 diet   Recent influenza A viral infection Originally diagnosed with influenza A on 07/04/2023; completed course of Tamiflu at facility.   Hyperkalemia: Resolved        Hypokalemia/hypophosphatemia -As needed repletion   Acute urinary retention Foley removed on 3/1 but due to persistent retention,  placed again on 3/2 Will need outptn urology follow up   Pleural effusion Severe aortic stenosis Reviewed TTE from December 2024 with preserved  LVEF, grade 1 diastolic dysfunction, large pleural effusion and severe aortic stenosis.  Patient is asymptomatic, no chest pain, no tamponade physiology.  Has outpatient appointment coming up with cardiology, Dr. Odis Hollingshead on 08/11/2023   Essential hypertension -Amlodipine 5 mg p.o. daily. Iv prn   Hyperlipidemia -Atorvastatin 20mg  p.o. daily   CAD Lipitor continue atorvastatin 20 mg p.o. daily    COPD:  -Bronchodilators   Dementia Mood disorder Adult failure to thrive -Cont bedtime Seroquel, dc am Seroquel. Lamictal reduced 150mg  bid (used to be on 200mg )= bid)   Weakness/deconditioning/gait disturbance/debility: Patient residing at [facility] memory care facility.  Most recent baseline able to ambulate with the use of a walker but since has been utilizing a wheelchair due to acute decline with recent diagnosis of influenza A on 2/7. -PT/OT evaluation  PICC line removed due to malfunction.  Midline placed   DVT prophylaxis: SCDs Start: 07/15/23 2126  DNR/DNI Family Communication: Clide Cliff Dc On Monday per TOC.  Subjective: Feeling okay no complaints today.  Examination:  General exam: Appears calm and comfortable, cachectic frail Respiratory system: Clear to auscultation. Respiratory effort normal. Cardiovascular system: S1 & S2 heard, RRR. No JVD, murmurs, rubs, gallops or clicks. No pedal edema. Gastrointestinal system: Abdomen is nondistended, soft and nontender. No organomegaly or masses felt. Normal bowel sounds heard. Central nervous system: Alert and oriented. No focal neurological deficits. Extremities: Symmetric 4 x 5 power. Skin: No rashes, lesions or ulcers Psychiatry: Judgement and insight appear poor Foley in place   Discharge Diagnoses:  Principal Problem:   Community acquired bacterial pneumonia Active Problems:   Sepsis (HCC)   Acute blood loss anemia   Platelet inhibition due to Plavix   Duodenal ulcer      Discharge Exam: Vitals:    07/28/23 2357 07/29/23 0438  BP: (!) 103/53 127/65  Pulse: 79 76  Resp: 17 16  Temp: 98 F (36.7 C) 97.8 F (36.6 C)  SpO2:     Vitals:   07/28/23 0508 07/28/23 0905 07/28/23 2357 07/29/23 0438  BP: (!) 122/59 134/74 (!) 103/53 127/65  Pulse: 72 71 79 76  Resp: 18 15 17 16   Temp: 97.9 F (36.6 C) (!) 97.5 F (36.4 C) 98 F (36.7 C) 97.8 F (36.6 C)  TempSrc: Oral Oral Axillary Axillary  SpO2: 99% 100%    Weight:      Height:          Discharge Instructions   Allergies as of 07/29/2023       Reactions   Sulfa Antibiotics Other (See Comments)   Unknown reaction Listed as an allergy on MAR Other Reaction(s): Other (See Comments), Unknown    Unknown reaction Listed as an allergy on MAR   Iodine Other (See Comments)   Unknown reaction Listed as an allergy on MAR   Lactose Intolerance (gi)    Ms Contin [morphine] Other (See Comments)   Unknown reaction Listed as an allergy on MAR   Aminoglycosides Other (See Comments)   Unknown reaction  Listed as an allergy on MAR   Aspirin Other (See Comments)   Unknown reaction  Listed as an allergy on MAR   Codeine Other (See Comments)   Unknown reaction Listed as an allergy on MAR   Ivp Dye [iodinated Contrast Media] Other (See Comments)   Unknown reaction Listed as an allergy  on MAR   Neomycin Other (See Comments)   Unknown reaction Listed as an allergy on MAR   Penicillins Rash, Other (See Comments)   Not listed on MAR Has patient had a PCN reaction causing immediate rash, facial/tongue/throat swelling, SOB or lightheadedness with hypotension: No Has patient had a PCN reaction causing severe rash involving mucus membranes or skin necrosis: No Has patient had a PCN reaction that required hospitalization No Has patient had a PCN reaction occurring within the last 10 years: No If all of the above answers are "NO", then may proceed with Cephalosporin use.   Phenothiazines Other (See Comments)   Unknown reaction Listed  as an allergy on MAR   Promethazine Other (See Comments)   Unknown reaction Not listed on MAR   Tetracyclines & Related Other (See Comments)   Unknown reaction Listed as an allergy on MAR        Medication List     STOP taking these medications    cephALEXin 500 MG capsule Commonly known as: KEFLEX   LORazepam 0.5 MG tablet Commonly known as: ATIVAN   oseltamivir 75 MG capsule Commonly known as: TAMIFLU       TAKE these medications    acetaminophen 325 MG tablet Commonly known as: TYLENOL Take 650 mg by mouth 3 (three) times daily.   albuterol 108 (90 Base) MCG/ACT inhaler Commonly known as: VENTOLIN HFA Inhale 2 puffs into the lungs every 4 (four) hours as needed for wheezing or shortness of breath.   amLODipine 5 MG tablet Commonly known as: NORVASC Take 5 mg by mouth daily.   antiseptic oral rinse Liqd 10 mLs by Mouth Rinse route at bedtime.   atorvastatin 20 MG tablet Commonly known as: LIPITOR Take 20 mg by mouth every evening.   benzonatate 100 MG capsule Commonly known as: TESSALON Take 1 capsule (100 mg total) by mouth 3 (three) times daily as needed for cough.   Breo Ellipta 200-25 MCG/ACT Aepb Generic drug: fluticasone furoate-vilanterol Inhale 1 puff into the lungs daily.   cefadroxil 500 MG capsule Commonly known as: DURICEF Take 1 capsule (500 mg total) by mouth 2 (two) times daily for 1 day.   clopidogrel 75 MG tablet Commonly known as: PLAVIX Take 1 tablet (75 mg total) by mouth daily.   cyanocobalamin 500 MCG tablet Commonly known as: VITAMIN B12 Take 1 tablet (500 mcg total) by mouth daily.   diclofenac Sodium 1 % Gel Commonly known as: VOLTAREN Apply 2 g topically 4 (four) times daily as needed (Pain).   donepezil 10 MG tablet Commonly known as: ARICEPT Take 10 mg by mouth at bedtime.   ferrous sulfate 325 (65 FE) MG tablet Take 1 tablet (325 mg total) by mouth 2 (two) times daily with a meal. What changed: when to take  this   fluticasone 50 MCG/ACT nasal spray Commonly known as: FLONASE Place 2 sprays into both nostrils daily.   guaiFENesin 600 MG 12 hr tablet Commonly known as: MUCINEX Take 2 tablets (1,200 mg total) by mouth 2 (two) times daily.   hydrocortisone 2.5 % cream Apply 1 Application topically daily as needed (Irritation).   Incruse Ellipta 62.5 MCG/INH Aepb Generic drug: umeclidinium bromide Inhale 1 puff into the lungs daily.   ipratropium-albuterol 0.5-2.5 (3) MG/3ML Soln Commonly known as: DUONEB Take 3 mLs by nebulization 3 (three) times daily.   lamoTRIgine 100 MG tablet Commonly known as: LAMICTAL Take 1 tablet (100 mg total) by mouth 2 (two) times daily. What  changed:  medication strength how much to take   linezolid 600 MG tablet Commonly known as: ZYVOX Take 1 tablet (600 mg total) by mouth every 12 (twelve) hours for 1 day.   magnesium oxide 400 (241.3 Mg) MG tablet Commonly known as: MAG-OX Take 400 mg by mouth daily.   montelukast 10 MG tablet Commonly known as: SINGULAIR Take 10 mg by mouth at bedtime.   Nyamyc powder Generic drug: nystatin Apply 1 Application topically 2 (two) times daily as needed (Rash).   pantoprazole 40 MG tablet Commonly known as: PROTONIX Take 1 tablet (40 mg total) by mouth 2 (two) times daily before a meal.   polyethylene glycol 17 g packet Commonly known as: MIRALAX / GLYCOLAX Take 17 g by mouth 2 (two) times daily as needed for moderate constipation.   potassium chloride SA 20 MEQ tablet Commonly known as: KLOR-CON M Take 20 mEq by mouth daily.   QUEtiapine 50 MG tablet Commonly known as: SEROQUEL Take 1 tablet (50 mg total) by mouth at bedtime. What changed:  how much to take when to take this additional instructions   senna-docusate 8.6-50 MG tablet Commonly known as: Senokot-S Take 1 tablet by mouth 2 (two) times daily.   torsemide 20 MG tablet Commonly known as: DEMADEX Take 3 tablets (60 mg total) by  mouth daily as needed (edema and fluid overload). What changed:  when to take this reasons to take this   Vitamin D 50 MCG (2000 UT) tablet Take 2,000 Units by mouth daily.        Follow-up Information     Valere Dross, FNP Follow up.   Specialty: Family Medicine Contact information: [address] [place] [address] [phone]         Valere Dross, FNP .   Specialty: Family Medicine Contact information: [address] [place] [address] [phone]                Allergies  Allergen Reactions   Sulfa Antibiotics Other (See Comments)    Unknown reaction  Listed as an allergy on MAR  Other Reaction(s): Other (See Comments), Unknown    Unknown reaction Listed as an allergy on MAR   Iodine Other (See Comments)    Unknown reaction Listed as an allergy on MAR   Lactose Intolerance (Gi)    Ms Contin [Morphine] Other (See Comments)    Unknown reaction Listed as an allergy on MAR   Aminoglycosides Other (See Comments)    Unknown reaction  Listed as an allergy on MAR   Aspirin Other (See Comments)    Unknown reaction  Listed as an allergy on MAR   Codeine Other (See Comments)    Unknown reaction Listed as an allergy on MAR   Ivp Dye [Iodinated Contrast Media] Other (See Comments)    Unknown reaction Listed as an allergy on MAR   Neomycin Other (See Comments)    Unknown reaction Listed as an allergy on MAR   Penicillins Rash and Other (See Comments)    Not listed on MAR  Has patient had a PCN reaction causing immediate rash, facial/tongue/throat swelling, SOB or lightheadedness with hypotension: No Has patient had a PCN reaction causing severe rash involving mucus membranes or skin necrosis: No Has patient had a PCN reaction that required hospitalization No Has patient had a PCN reaction occurring within the last 10 years: No If all of the above answers are "NO", then may proceed with Cephalosporin use.   Phenothiazines  Other (See  Comments)    Unknown reaction Listed as an allergy on MAR   Promethazine Other (See Comments)    Unknown reaction Not listed on MAR   Tetracyclines & Related Other (See Comments)    Unknown reaction Listed as an allergy on MAR    You were cared for by a hospitalist during your hospital stay. If you have any questions about your discharge medications or the care you received while you were in the hospital after you are discharged, you can call the unit and asked to speak with the hospitalist on call if the hospitalist that took care of you is not available. Once you are discharged, your primary care physician will handle any further medical issues. Please note that no refills for any discharge medications will be authorized once you are discharged, as it is imperative that you return to your primary care physician (or establish a relationship with a primary care physician if you do not have one) for your aftercare needs so that they can reassess your need for medications and monitor your lab values.  You were cared for by a hospitalist during your hospital stay. If you have any questions about your discharge medications or the care you received while you were in the hospital after you are discharged, you can call the unit and asked to speak with the hospitalist on call if the hospitalist that took care of you is not available. Once you are discharged, your primary care physician will handle any further medical issues. Please note that NO REFILLS for any discharge medications will be authorized once you are discharged, as it is imperative that you return to your primary care physician (or establish a relationship with a primary care physician if you do not have one) for your aftercare needs so that they can reassess your need for medications and monitor your lab values.  Please request your Prim.MD to go over all Hospital Tests and Procedure/Radiological results at the follow up, please get all Hospital  records sent to your Prim MD by signing hospital release before you go home.  Get CBC, CMP, 2 view Chest X ray checked  by Primary MD during your next visit or SNF MD in 5-7 days ( we routinely change or add medications that can affect your baseline labs and fluid status, therefore we recommend that you get the mentioned basic workup next visit with your PCP, your PCP may decide not to get them or add new tests based on their clinical decision)  On your next visit with your primary care physician please Get Medicines reviewed and adjusted.  If you experience worsening of your admission symptoms, develop shortness of breath, life threatening emergency, suicidal or homicidal thoughts you must seek medical attention immediately by calling 911 or calling your MD immediately  if symptoms less severe.  You Must read complete instructions/literature along with all the possible adverse reactions/side effects for all the Medicines you take and that have been prescribed to you. Take any new Medicines after you have completely understood and accpet all the possible adverse reactions/side effects.   Do not drive, operate heavy machinery, perform activities at heights, swimming or participation in water activities or provide baby sitting services if your were admitted for syncope or siezures until you have seen by Primary MD or a Neurologist and advised to do so again.  Do not drive when taking Pain medications.   Procedures/Studies: [place] EKG SITE RITE Result Date: 07/25/2023 If [employer]  not attached, placement could not be confirmed due to current cardiac rhythm.  DG CHEST PORT 1 VIEW Result Date: 07/25/2023 CLINICAL DATA:  PICC placement. EXAM: PORTABLE CHEST 1 VIEW COMPARISON:  Chest radiograph dated 07/23/2023. FINDINGS: Right-sided PICC with tip over the right clavicular head. Recommend further advancing for optimal positioning. Shallow inspiration with bibasilar atelectasis. No focal consolidation,  pleural effusion, or pneumothorax. The cardiac silhouette is within normal limits. Atherosclerotic calcification of the aorta. Hypoglossal stimulator or the left chest. IMPRESSION: 1. Right-sided PICC with tip over the right clavicular head. Recommend further advancing for optimal positioning. 2. Shallow inspiration with bibasilar atelectasis. Electronically Signed   By: Elgie Collard M.D.   On: 07/25/2023 14:10   DG Chest Port 1 View Result Date: 07/23/2023 CLINICAL DATA:  Dyspnea EXAM: PORTABLE CHEST 1 VIEW COMPARISON:  Chest x-ray 07/15/2023 FINDINGS: Generator overlies the left chest, unchanged. The heart size and mediastinal contours are within normal limits. Both lungs are clear. The visualized skeletal structures are unremarkable. IMPRESSION: No active disease. Electronically Signed   By: Darliss Cheney M.D.   On: 07/23/2023 18:12   [place] EKG SITE RITE Result Date: 07/23/2023 If [facility] image not attached, placement could not be confirmed due to current cardiac rhythm.  VAS [place] ABI WITH/WO TBI Result Date: 07/16/2023  LOWER EXTREMITY DOPPLER STUDY Patient Name:  Cindy Padilla  Date of Exam:   07/16/2023 Medical Rec #: [id number]              Accession #:    [id number] Date of Birth: 06/21/1940              Patient Gender: F Patient Age:   83 years Exam Location:  [facility] Procedure:      VAS [place] ABI WITH/WO TBI Referring Phys: ERIC [place] --------------------------------------------------------------------------------  Indications: Ulceration. High Risk Factors: Hypertension.  Limitations: Today's exam was limited due to patient positioning, patient              intolerant to cuff pressure, an open wound, bandages and              involuntary patient movement. Comparison Study: No prior studies. Performing Technologist: Olen Cordial RVT  Examination Guidelines: A complete evaluation includes at minimum, Doppler waveform signals and systolic blood pressure reading at the level of  bilateral brachial, anterior tibial, and posterior tibial arteries, when vessel segments are accessible. Bilateral testing is considered an integral part of a complete examination. Photoelectric Plethysmograph (PPG) waveforms and toe systolic pressure readings are included as required and additional duplex testing as needed. Limited examinations for reoccurring indications may be performed as noted.  ABI Findings: +--------+------------------+-----+-----------+--------+ Right   Rt Pressure (mmHg)IndexWaveform   Comment  +--------+------------------+-----+-----------+--------+ [id number]                    triphasic           +--------+------------------+-----+-----------+--------+ PTA                            multiphasic         +--------+------------------+-----+-----------+--------+ DP                             multiphasic         +--------+------------------+-----+-----------+--------+ +----+------------------+-----+-----------+-------+ LeftLt Pressure (mmHg)IndexWaveform   Comment +----+------------------+-----+-----------+-------+ PTA  multiphasic        +----+------------------+-----+-----------+-------+ DP                         multiphasic        +----+------------------+-----+-----------+-------+  Summary: Right: Unable to obtain ABI due to patient pain tolerance, positioning, and constant movement. Waveforms detected in the posterior tibial, and dorsalis pedis arteries are noted to be multiphasic. Unable to obtain TBI due to patient constant movement. Left: Unable to obtain ABI due to patient pain tolerance, positioning, open wound, bandages, and constant movement. Waveforms detected in the posterior tibial, and dorsalis pedis arteries are noted to be multiphasic. Unable to obtain TBI due to patient constant movement, and great toe anatomy. *See table(s) above for measurements and observations.  Electronically signed by Coral Else MD  on 07/16/2023 at 8:13:29 PM.    Final    DG Chest 2 View Result Date: 07/15/2023 CLINICAL DATA:  Altered mental status. EXAM: CHEST - 2 VIEW COMPARISON:  Radiograph 07/04/2023, CT 02/22/2022 FINDINGS: Chronic elevation of right hemidiaphragm. Stable heart size and mediastinal contours. Stimulator with lead coursing into the neck, unchanged. No acute airspace disease, pleural effusion, or pneumothorax. Stable osseous structures. IMPRESSION: No acute findings. Electronically Signed   By: Narda Rutherford M.D.   On: 07/15/2023 16:46   CT Head Wo Contrast Result Date: 07/15/2023 CLINICAL DATA:  Provided history: Mental status change, unknown cause. Lethargy. Headache. History of dementia. Recent UTI. EXAM: CT HEAD WITHOUT CONTRAST TECHNIQUE: Contiguous axial images were obtained from the base of the skull through the vertex without intravenous contrast. RADIATION DOSE REDUCTION: This exam was performed according to the departmental dose-optimization program which includes automated exposure control, adjustment of the mA and/or kV according to patient size and/or use of iterative reconstruction technique. COMPARISON:  Prior head CT examinations 07/04/2023 and earlier. FINDINGS: Brain: Generalized cerebral and cerebellar atrophy. Chronic lacunar infarcts again demonstrated within/about the bilateral basal ganglia. Background moderate patchy and ill-defined hypoattenuation within the cerebral white matter, nonspecific but compatible chronic small vessel ischemic disease. There is no acute intracranial hemorrhage. No demarcated cortical infarct. No extra-axial fluid collection. No evidence of an intracranial mass. No midline shift. Vascular: No hyperdense vessel.  Atherosclerotic calcifications. Skull: No calvarial fracture or aggressive osseous lesion. Sinuses/Orbits: No mass or acute finding within the imaged orbits. No significant paranasal sinus disease at the imaged levels. IMPRESSION: 1. No evidence of an acute  intracranial abnormality. 2. 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 3. Background moderate cerebral white matter chronic small vessel ischemic disease. 4. Generalized parenchymal atrophy. Electronically Signed   By: Jackey Loge D.O.   On: 07/15/2023 15:32   VAS [place] LOWER EXTREMITY VENOUS (DVT) (ONLY [facility] & [facility]) Result Date: 07/05/2023  Lower Venous DVT Study Patient Name:  SHANTERICA BIEHLER  Date of Exam:   07/04/2023 Medical Rec #: [id number]              Accession #:    [id number] Date of Birth: April 20, 1941              Patient Gender: F Patient Age:   3 years Exam Location:  [facility] Procedure:      VAS [place] LOWER EXTREMITY VENOUS (DVT) Referring Phys: Molly Maduro PATERSON --------------------------------------------------------------------------------  Indications: Swelling, and Edema.  Comparison Study: Previous study on 10.24.2014. Performing Technologist: Fernande Bras  Examination Guidelines: A complete evaluation includes B-mode imaging, spectral Doppler, color Doppler, and power Doppler as  needed of all accessible portions of each vessel. Bilateral testing is considered an integral part of a complete examination. Limited examinations for reoccurring indications may be performed as noted. The reflux portion of the exam is performed with the patient in reverse Trendelenburg.  +-----+---------------+---------+-----------+----------+--------------+ RIGHTCompressibilityPhasicitySpontaneityPropertiesThrombus Aging +-----+---------------+---------+-----------+----------+--------------+ CFV  Full           Yes      Yes                                 +-----+---------------+---------+-----------+----------+--------------+ SFJ  Full           Yes      Yes                                 +-----+---------------+---------+-----------+----------+--------------+   +---------+---------------+---------+-----------+----------+--------------+ LEFT      CompressibilityPhasicitySpontaneityPropertiesThrombus Aging +---------+---------------+---------+-----------+----------+--------------+ CFV      Full           Yes      Yes                                 +---------+---------------+---------+-----------+----------+--------------+ SFJ      Full           Yes      Yes                                 +---------+---------------+---------+-----------+----------+--------------+ FV Prox  Full                                                        +---------+---------------+---------+-----------+----------+--------------+ FV Mid   Full                                                        +---------+---------------+---------+-----------+----------+--------------+ FV DistalFull                                                        +---------+---------------+---------+-----------+----------+--------------+ PFV      Full                                                        +---------+---------------+---------+-----------+----------+--------------+ POP      Full           Yes      Yes                                 +---------+---------------+---------+-----------+----------+--------------+ PTV      Full                                                        +---------+---------------+---------+-----------+----------+--------------+  PERO     Full                                                        +---------+---------------+---------+-----------+----------+--------------+    Summary: RIGHT: - No evidence of common femoral vein obstruction.   LEFT: - There is no evidence of deep vein thrombosis in the lower extremity.  - No cystic structure found in the popliteal fossa.  *See table(s) above for measurements and observations. Electronically signed by Gerarda Fraction on 07/05/2023 at 8:19:55 AM.    Final    DG Elbow Complete Left Result Date: 07/04/2023 CLINICAL DATA:  Fall. EXAM: LEFT ELBOW - COMPLETE 3+  VIEW COMPARISON:  None Available. FINDINGS: There is no evidence of fracture, dislocation, or joint effusion. There is no evidence of arthropathy or other focal bone abnormality. Soft tissues are unremarkable. IMPRESSION: Negative. Electronically Signed   By: Charlett Nose M.D.   On: 07/04/2023 18:07   DG Tibia/Fibula Left Result Date: 07/04/2023 CLINICAL DATA:  Fall. EXAM: LEFT TIBIA AND FIBULA - 2 VIEW COMPARISON:  None Available. FINDINGS: Soft tissue swelling noted along the anterior mid calf. No acute bony abnormality. Specifically, no fracture, subluxation, or dislocation. IMPRESSION: No acute bony abnormality. Electronically Signed   By: Charlett Nose M.D.   On: 07/04/2023 18:06   DG Pelvis Portable Result Date: 07/04/2023 CLINICAL DATA:  Unwitnessed fall. EXAM: PORTABLE PELVIS 1-2 VIEWS COMPARISON:  None Available. FINDINGS: Hip joints and SI joints symmetric. No acute bony abnormality. Specifically, no fracture, subluxation, or dislocation. IMPRESSION: No acute bony abnormality. Electronically Signed   By: Charlett Nose M.D.   On: 07/04/2023 18:06   DG Chest 2 View Result Date: 07/04/2023 CLINICAL DATA:  Fall EXAM: CHEST - 2 VIEW COMPARISON:  05/26/2023 FINDINGS: Stable mild elevation of the right hemidiaphragm. Heart and mediastinal contours are within normal limits. No focal opacities or effusions. No acute bony abnormality. Left chest wall battery pack again noted, unchanged. IMPRESSION: No active cardiopulmonary disease. Electronically Signed   By: Charlett Nose M.D.   On: 07/04/2023 18:05   CT Cervical Spine Wo Contrast Result Date: 07/04/2023 CLINICAL DATA:  Neck trauma.  Unwitnessed fall. EXAM: CT CERVICAL SPINE WITHOUT CONTRAST TECHNIQUE: Multidetector CT imaging of the cervical spine was performed without intravenous contrast. Multiplanar CT image reconstructions were also generated. RADIATION DOSE REDUCTION: This exam was performed according to the departmental dose-optimization program which  includes automated exposure control, adjustment of the mA and/or kV according to patient size and/or use of iterative reconstruction technique. COMPARISON:  CT of the cervical spine 05/22/2023 FINDINGS: Alignment: Grade 1 degenerative anterolisthesis at C3-4 is stable. No other significant listhesis is present. Straightening of the normal cervical lordosis is stable. Skull base and vertebrae: The craniocervical junction is normal. Vertebral body heights are normal. No acute or healing fractures are present. Soft tissues and spinal canal: No prevertebral fluid or swelling. No visible canal hematoma. Atherosclerotic calcifications are present within the carotid bifurcations bilaterally without definite stenosis. Stimulator wires are noted in the left neck. Disc levels: Multilevel degenerative changes in the cervical spine are stable. Upper chest: The lung apices are clear. The thoracic inlet is within normal limits. IMPRESSION: 1. No acute or healing fractures of the cervical spine. 2. Stable multilevel degenerative changes in the cervical spine. 3. Atherosclerotic calcifications within  the carotid bifurcations bilaterally without definite stenosis. Electronically Signed   By: Marin Roberts M.D.   On: 07/04/2023 17:29   CT Head Wo Contrast Result Date: 07/04/2023 CLINICAL DATA:  Unwitnessed fall. Contusion to the left lower extremity. Dementia. EXAM: CT HEAD WITHOUT CONTRAST TECHNIQUE: Contiguous axial images were obtained from the base of the skull through the vertex without intravenous contrast. RADIATION DOSE REDUCTION: This exam was performed according to the departmental dose-optimization program which includes automated exposure control, adjustment of the mA and/or kV according to patient size and/or use of iterative reconstruction technique. COMPARISON:  None Available. FINDINGS: Brain: Moderate atrophy and white matter changes are present bilaterally. Deep brain nuclei are within normal limits. The  ventricles are of normal size. No significant extraaxial fluid collection is present. The brainstem and cerebellum are within normal limits. Midline structures are within normal limits. Vascular: Atherosclerotic calcifications are present within the cavernous internal carotid arteries bilaterally. No hyperdense vessel is present. Skull: Calvarium is intact. No focal lytic or blastic lesions are present. No significant extracranial soft tissue lesion is present. Sinuses/Orbits: The paranasal sinuses and mastoid air cells are clear. The globes and orbits are within normal limits. IMPRESSION: 1. No acute intracranial abnormality or significant traumatic injury. 2. Moderate atrophy and white matter disease likely reflects the sequela of chronic microvascular ischemia. Electronically Signed   By: Marin Roberts M.D.   On: 07/04/2023 17:26     The results of significant diagnostics from this hospitalization (including imaging, microbiology, ancillary and laboratory) are listed below for reference.     Microbiology: No results found for this or any previous visit (from the past 240 hours).   Labs: BNP (last 3 results) Recent Labs    05/24/23 1114 07/15/23 1523 07/23/23 1116  BNP 129.8* 163.9* 282.2*   Basic Metabolic Panel: Recent Labs  Lab 07/24/23 0329 07/25/23 0213 07/26/23 0424 07/27/23 0520 07/28/23 0402  NA 137 137 135 134* 135  K 3.4* 3.6 3.7 3.5 3.8  CL 110 108 106 107 108  CO2 21* 23 21* 22 19*  GLUCOSE 109* 111* 102* 91 77  BUN 14 14 16 19 21   CREATININE 0.89 0.92 0.86 1.05* 1.18*  CALCIUM 7.4* 7.7* 7.6* 7.6* 7.6*  MG 2.1 2.1 1.9 2.0 2.1   Liver Function Tests: No results for input(s): "AST", "ALT", "ALKPHOS", "BILITOT", "PROT", "ALBUMIN" in the last 168 hours. No results for input(s): "LIPASE", "AMYLASE" in the last 168 hours. No results for input(s): "AMMONIA" in the last 168 hours.  CBC: Recent Labs  Lab 07/24/23 0329 07/25/23 0213 07/26/23 0424  07/27/23 0520 07/28/23 0402  WBC 4.3 5.7 6.0 5.5 6.4  HGB 7.4* 8.1* 8.2* 8.0* 8.2*  HCT 24.7* 26.9* 26.6* 26.0* 26.8*  MCV 101.2* 99.3 96.4 97.0 98.9  PLT 188 199 218 210 176   Cardiac Enzymes: No results for input(s): "CKTOTAL", "CKMB", "CKMBINDEX", "TROPONINI" in the last 168 hours. BNP: Invalid input(s): "POCBNP" CBG: Recent Labs  Lab 07/28/23 0841 07/28/23 1137 07/28/23 1636 07/28/23 2325 07/29/23 0512  GLUCAP 73 87 92 83 84   D-Dimer No results for input(s): "DDIMER" in the last 72 hours. Hgb A1c No results for input(s): "HGBA1C" in the last 72 hours. Lipid Profile No results for input(s): "CHOL", "HDL", "LDLCALC", "TRIG", "CHOLHDL", "LDLDIRECT" in the last 72 hours. Thyroid function studies No results for input(s): "TSH", "T4TOTAL", "T3FREE", "THYROIDAB" in the last 72 hours.  Invalid input(s): "FREET3" Anemia work up No results for input(s): "VITAMINB12", "FOLATE", "FERRITIN", "TIBC", "IRON", "  RETICCTPCT" in the last 72 hours. Urinalysis    Component Value Date/Time   COLORURINE AMBER (A) 07/15/2023 1914   APPEARANCEUR CLOUDY (A) 07/15/2023 1914   LABSPEC 1.016 07/15/2023 1914   PHURINE 6.0 07/15/2023 1914   GLUCOSEU NEGATIVE 07/15/2023 1914   HGBUR NEGATIVE 07/15/2023 1914   BILIRUBINUR NEGATIVE 07/15/2023 1914   KETONESUR 5 (A) 07/15/2023 1914   PROTEINUR NEGATIVE 07/15/2023 1914   UROBILINOGEN 0.2 09/19/2014 1633   NITRITE NEGATIVE 07/15/2023 1914   LEUKOCYTESUR LARGE (A) 07/15/2023 1914   Sepsis Labs Recent Labs  Lab 07/25/23 0213 07/26/23 0424 07/27/23 0520 07/28/23 0402  WBC 5.7 6.0 5.5 6.4   Microbiology No results found for this or any previous visit (from the past 240 hours).   Time coordinating discharge:  I have spent 35 minutes face to face with the patient and on the ward discussing the patients care, assessment, plan and disposition with other care givers. >50% of the time was devoted counseling the patient about the risks and  benefits of treatment/Discharge disposition and coordinating care.   SIGNED:   Miguel Rota, MD  [facility] 07/29/2023, 7:40 AM   If 7PM-7AM, please contact night-coverage

## 2023-07-28 NOTE — Progress Notes (Signed)
 Hypoglycemic Event  CBG: 68  Treatment: 4 oz juice/soda  Symptoms: None  Follow-up CBG: Time:  0840 CBG Result:73  Possible Reasons for Event: Inadequate meal intake  Comments/MD notified: Hermine Messick, Ceclia Koker L

## 2023-07-28 NOTE — Progress Notes (Signed)
 Marland Kitchen

## 2023-07-28 NOTE — TOC Progression Note (Addendum)
 Transition of Care [facility]) - Progression Note    Patient Details  Name: Cindy Padilla MRN: [id number] Date of Birth: [birth year]  [facility] CM/SW Contact  Amada Jupiter, LCSW Phone Number: 07/28/2023, 2:34 PM  Clinical Narrative:     Have spoken with pt's daughter today after review of pt's care needs with therapy and MD.  Pt has really not been able to engage with therapy for several days and not showing rehab "potential" at this time AND has been declined by SNFs who had originally offered beds many days ago.  Daughter is aware and understands that more appropriate plan appears to be looking for a LTC bed under pt's Medicaid coverage.  Daughter agreeable with plan and have alerted MD.  Referral resubmitted to all area nursing facilities.  Continue to follow.  ADDENDUM: [facility] able to offer LTC bed and admit patient tomorrow.  Pt/daughter have accepted.  MD aware.  Expected Discharge Plan: Skilled Nursing Facility Barriers to Discharge: Continued Medical Work up, SNF Pending bed offer  Expected Discharge Plan and Services In-house Referral: Clinical Social Work   Post Acute Care Choice: Skilled Nursing Facility Living arrangements for the past 2 months:  (Memory Care at [facility])                 DME Arranged: N/A DME Agency: NA                   Social Determinants of Health (SDOH) Interventions SDOH Screenings   Food Insecurity: No Food Insecurity (05/24/2023)  Housing: Low Risk  (05/24/2023)  Transportation Needs: No Transportation Needs (05/24/2023)  Utilities: Not At Risk (05/24/2023)  Social Connections: Moderately Integrated (05/27/2023)  Tobacco Use: Medium Risk (07/25/2023)    Readmission Risk Interventions    07/17/2023    2:56 PM  Readmission Risk Prevention Plan  Transportation Screening Complete  PCP or Specialist Appt within 3-5 Days Complete  HRI or Home Care Consult Complete  Social Work Consult for Recovery Care  Planning/Counseling Complete

## 2023-07-28 NOTE — NC FL2 (Addendum)
 [place] MEDICAID FL2 LEVEL OF CARE FORM     IDENTIFICATION  Patient Name: Cindy Padilla Birthdate: August 13, 1940 Sex: female Admission Date (Current Location): 07/15/2023  [facility] and [place] Number:  Producer, television/film/video and Address:  [facility],  [address] [place], [place] [address]      Provider Number: [id number]  Attending Physician Name and Address:  Miguel Rota, MD  Relative Name and Phone Number:  daughter, Leonides Sake @ [phone]    Current Level of Care: SNF Recommended Level of Care: Skilled Nursing Facility Prior Approval Number:    Date Approved/Denied:   PASRR Number: [id number]  Discharge Plan: SNF    Current Diagnoses: Patient Active Problem List   Diagnosis Date Noted   Duodenal ulcer 07/25/2023   Acute blood loss anemia 07/21/2023   Platelet inhibition due to Plavix 07/21/2023   Sepsis (HCC) 07/16/2023   Community acquired bacterial pneumonia 07/15/2023   Acute metabolic encephalopathy 05/28/2023   RSV bronchitis 05/24/2023   Melena    Heme positive stool    Acute GI bleeding 04/13/2020   Near syncope 10/15/2019   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   BRBPR (bright red blood per rectum) 04/28/2019   Acute exacerbation of chronic obstructive pulmonary disease (COPD) (HCC) 02/26/2017   COPD with acute exacerbation (HCC) 02/24/2017   Bipolar 1 disorder (HCC) 02/24/2017   Normocytic anemia 02/24/2017   Morbid obesity (HCC) 04/14/2015   Upper airway cough syndrome 04/14/2015   Ventral hernia 04/24/2014   Constipation 04/24/2014   Hemorrhoids 04/22/2014   Abnormality of gait 09/30/2013   Dyskinesia, 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   Chronic diastolic CHF (congestive heart failure) (HCC) 07/27/2013   Dementia with behavioral disturbance (HCC) 07/27/2013   Weakness 02/09/2013   Hyponatremia 02/09/2013   Shortness of breath 02/09/2013   HTN (hypertension) 02/09/2013   Other and  unspecified hyperlipidemia 02/09/2013    Orientation RESPIRATION BLADDER Height & Weight     Self  Normal Incontinent Weight: 139 lb 8.8 oz (63.3 kg) Height:  5' (152.4 cm)  BEHAVIORAL SYMPTOMS/MOOD NEUROLOGICAL BOWEL NUTRITION STATUS      Incontinent Diet (Dys 2, thin liquids)  AMBULATORY STATUS COMMUNICATION OF NEEDS Skin   Extensive Assist Verbally Other (Comment) (1.  Pressure Injury Sacrum Right;Left;Mid Stage 1 -  Intact skin with non-blanchable redness of a localized area usually over a bony prominence.  2. Avulsion Pretibial Left;Anterior;Lower Large avulsion with skin flap replaced with dressing)                       Personal Care Assistance Level of Assistance  Bathing, Feeding, Dressing, Total care Bathing Assistance: Limited assistance Feeding assistance: Limited assistance Dressing Assistance: Limited assistance Total Care Assistance: Maximum assistance   Functional Limitations Info  Sight, Hearing, Speech Sight Info: Adequate Hearing Info: Adequate Speech Info: Adequate    SPECIAL CARE FACTORS FREQUENCY  PT (By licensed PT), OT (By licensed OT)     PT Frequency: 3x/wk OT Frequency: 3x/wk            Contractures Contractures Info: Not present    Additional Factors Info  Code Status, Allergies, Psychotropic Code Status Info: DNR Allergies Info: Sulfa Antibiotics, Iodine, Ms Contin (Morphine), Aminoglycosides, Aspirin, Codeine, Ivp Dye (Iodinated Contrast Media), Neomycin, Penicillins, Phenothiazines, Promethazine, Tetracyclines & Related Psychotropic Info: see MAR         Current Medications (07/28/2023):  This is the current hospital active medication list Current  Facility-Administered Medications  Medication Dose Route Frequency Provider Last Rate Last Admin   acetaminophen (TYLENOL) tablet 650 mg  650 mg Oral Q6H PRN Alan Mulder, MD   650 mg at 07/24/23 2155   Or   acetaminophen (TYLENOL) suppository 650 mg  650 mg Rectal Q6H PRN Alan Mulder, MD       amLODipine (NORVASC) tablet 5 mg  5 mg Oral Daily Dorrell, Robert, MD   5 mg at 07/28/23 0908   atorvastatin (LIPITOR) tablet 20 mg  20 mg Oral QPM Alan Mulder, MD   20 mg at 07/26/23 1713   cefTRIAXone (ROCEPHIN) 2 g in sodium chloride 0.9 % 100 mL IVPB  2 g Intravenous Q24H Amin, Ankit C, MD 200 mL/hr at 07/26/23 1713 2 g at 07/26/23 1713   Chlorhexidine Gluconate Cloth 2 % PADS 6 each  6 each Topical Daily Alan Mulder, MD   6 each at 07/28/23 1033   clopidogrel (PLAVIX) tablet 75 mg  75 mg Oral Daily Amin, Ankit C, MD       dextrose 50 % solution 50 mL  1 ampule Intravenous Once Amin, Ankit C, MD       donepezil (ARICEPT) tablet 10 mg  10 mg Oral QHS Alan Mulder, MD   10 mg at 07/27/23 2148   fluticasone furoate-vilanterol (BREO ELLIPTA) 200-25 MCG/ACT 1 puff  1 puff Inhalation Daily [place], Alvira Philips, DO   1 puff at 07/28/23 0945   hydrALAZINE (APRESOLINE) injection 10 mg  10 mg Intravenous Q4H PRN Amin, Ankit C, MD       ipratropium-albuterol (DUONEB) 0.5-2.5 (3) MG/3ML nebulizer solution 3 mL  3 mL Nebulization Q4H PRN Amin, Ankit C, MD       lamoTRIgine (LAMICTAL) tablet 100 mg  100 mg Oral BID Amin, Ankit C, MD   100 mg at 07/28/23 0908   linezolid (ZYVOX) tablet 600 mg  600 mg Oral Q12H Hessie Knows, RPH   600 mg at 07/28/23 1610   metoprolol tartrate (LOPRESSOR) injection 5 mg  5 mg Intravenous Q4H PRN Amin, Ankit C, MD       montelukast (SINGULAIR) tablet 10 mg  10 mg Oral QHS [place], Alvira Philips, DO   10 mg at 07/27/23 2148   ondansetron (ZOFRAN) tablet 4 mg  4 mg Oral Q6H PRN Alan Mulder, MD       Or   ondansetron (ZOFRAN) injection 4 mg  4 mg Intravenous Q6H PRN Alan Mulder, MD   4 mg at 07/26/23 0245   oxyCODONE (Oxy IR/ROXICODONE) immediate release tablet 5 mg  5 mg Oral Q4H PRN Alan Mulder, MD   5 mg at 07/15/23 2213   pantoprazole (PROTONIX) EC tablet 40 mg  40 mg Oral BID AC Amin, Ankit C, MD   40 mg at 07/28/23 0908   potassium chloride  (KLOR-CON) packet 40 mEq  40 mEq Oral Once Amin, Ankit C, MD       QUEtiapine (SEROQUEL) tablet 50 mg  50 mg Oral QHS Amin, Ankit C, MD   50 mg at 07/27/23 2148   senna-docusate (Senokot-S) tablet 1 tablet  1 tablet Oral QHS PRN Amin, Ankit C, MD       sodium chloride flush (NS) 0.9 % injection 10-40 mL  10-40 mL Intracatheter Q12H Amin, Ankit C, MD   10 mL at 07/28/23 1033   sodium chloride flush (NS) 0.9 % injection 10-40 mL  10-40 mL Intracatheter PRN Miguel Rota, MD  traZODone (DESYREL) tablet 50 mg  50 mg Oral QHS PRN Amin, Ankit C, MD       umeclidinium bromide (INCRUSE ELLIPTA) 62.5 MCG/ACT 1 puff  1 puff Inhalation Daily [place], Alvira Philips, DO   1 puff at 07/28/23 0945     Discharge Medications: Please see discharge summary for a list of discharge medications.  Relevant Imaging Results:  Relevant Lab Results:   Additional Information SSN [id number]  Amada Jupiter, LCSW

## 2023-07-29 LAB — GLUCOSE, CAPILLARY
Glucose-Capillary: 84 mg/dL (ref 70–99)
Glucose-Capillary: 84 mg/dL (ref 70–99)

## 2023-07-29 NOTE — Progress Notes (Signed)
 Patient discharged yesterday but due to disposition issue, she will be discharged to long-term care facility today No acute overnight events. Will discontinue midline catheter prior to her discharge. Called daughter Darl Pikes, no answer.  Discharge summary completed 3/3 and updated today 3/4.  Stephania Fragmin MD [facility]

## 2023-07-29 NOTE — Plan of Care (Signed)

## 2023-07-29 NOTE — TOC Transition Note (Signed)
 Transition of Care [facility]) - Discharge Note   Patient Details  Name: Cindy Padilla MRN: [id number] Date of Birth: 27-Apr-1941  [facility] CM/SW Contact:  Amada Jupiter, LCSW Phone Number: 07/29/2023, 1:24 PM   Clinical Narrative:    Pt is medically cleared for dc today to LTC bed at [facility]  Have spoken with daughter about possible referral to community palliative program to follow at facility and she is agreeable - choosing [facility].  Referral placed.  PTAR called at 1:20pm.  RN to call report to [phone].  No further TOC needs.   Final next level of care: Long Term Nursing Home Barriers to Discharge: Barriers Resolved   Patient Goals and CMS Choice Patient states their goals for this hospitalization and ongoing recovery are:: LTC          Discharge Placement PASRR number recieved: 07/28/23            Patient chooses bed at: [facility] Patient to be transferred to facility by: PTAR Name of family member notified: daughter, Darl Pikes Patient and family notified of of transfer: 07/29/23  Discharge Plan and Services Additional resources added to the After Visit Summary for   In-house Referral: Clinical Social Work   Post Acute Care Choice: Skilled Nursing Facility          DME Arranged: N/A DME Agency: NA                  Social Drivers of Health (SDOH) Interventions SDOH Screenings   Food Insecurity: No Food Insecurity (05/24/2023)  Housing: Low Risk  (05/24/2023)  Transportation Needs: No Transportation Needs (05/24/2023)  Utilities: Not At Risk (05/24/2023)  Social Connections: Moderately Integrated (05/27/2023)  Tobacco Use: Medium Risk (07/25/2023)     Readmission Risk Interventions    07/17/2023    2:56 PM  Readmission Risk Prevention Plan  Transportation Screening Complete  PCP or Specialist Appt within 3-5 Days Complete  HRI or Home Care Consult Complete  Social Work Consult for  Recovery Care Planning/Counseling Complete

## 2023-07-29 NOTE — Progress Notes (Signed)
 [facility] Liaison note:       Notified by [facility] of patient/family request for [employer] [facility]iative services at [facility] after discharge.              Hospital liaison will follow patient for discharge disposition.       Please call with any hospice or outpatient palliative care related questions.         Thank you for the opportunity to participate in this patient's care. Thea Gist, BSN, RN Hospice hospital liaison [phone]

## 2023-08-11 ENCOUNTER — Ambulatory Visit: Payer: Medicare Other | Attending: Cardiology | Admitting: Cardiology

## 2023-08-11 ENCOUNTER — Encounter: Payer: Self-pay | Admitting: Cardiology

## 2023-08-11 VITALS — BP 120/58 | HR 80 | Resp 16 | Ht 60.0 in | Wt 142.0 lb

## 2023-08-11 DIAGNOSIS — I5031 Acute diastolic (congestive) heart failure: Secondary | ICD-10-CM

## 2023-08-11 DIAGNOSIS — F0390 Unspecified dementia without behavioral disturbance: Secondary | ICD-10-CM | POA: Diagnosis present

## 2023-08-11 DIAGNOSIS — I10 Essential (primary) hypertension: Secondary | ICD-10-CM

## 2023-08-11 DIAGNOSIS — I350 Nonrheumatic aortic (valve) stenosis: Secondary | ICD-10-CM

## 2023-08-11 DIAGNOSIS — E782 Mixed hyperlipidemia: Secondary | ICD-10-CM | POA: Diagnosis not present

## 2023-08-11 MED ORDER — TORSEMIDE 20 MG PO TABS: 20.0000 mg | ORAL_TABLET | Freq: Every day | ORAL | 3 refills | Status: DC

## 2023-08-11 NOTE — Progress Notes (Signed)
 Cardiology Office Note:    Date:  08/11/2023  NAME:  Cindy Padilla    MRN: [id number] DOB:  30-Jun-1940   PCP:  Valere Dross, FNP  Former Cardiology Providers: N/A Primary Cardiologist:  Tessa Lerner, DO, [facility] (established care 08/11/2023) Electrophysiologist:  None   Referring MD: [name], MD  Reason of Consult: Aortic stenosis  Chief Complaint  Patient presents with   Severe aortic stenosis   New Patient (Initial Visit)    History of Present Illness:    Cindy Padilla is a 83 y.o. Caucasian female whose past medical history and cardiovascular risk factors includes: Dementia, hypertension, hyperlipidemia,  COPD, mood disorder, aortic stenosis. She is being seen today for the evaluation of aortic stenosis at the request of Regalado, Prentiss Bells, MD.  Patient was discharged from the hospital on 07/28/2023 and based on the discharge summary she had presented to the ED on 2/18 via EMS from [facility] memory care facility with confusion and progressive decline in activity over the preceding week.  She was diagnosed recently with ESBL UTI and influenza and was hospitalized for failure to thrive/electrolyte normalities/encephalopathy/Hemoccult positive stool.    Patient underwent endoscopy and showed nonbleeding ulcers and PPI was recommended.  Prior echocardiogram from December 2024 reported severe aortic stenosis-Peak velocity 3.89 m/s, peak gradient 60.6 mmHg, mean gradient 47 mmHg, AVA per VTI 0.74 cm.  This is in comparison to her prior study from May 2021 which reported moderate aortic stenosis.  Clinically patient is asymptomatic will refer to cardiology for further guidance.  Patient is accompanied by her daughter Leonides Sake informs that she is also the POA.  Due to her dementia and recent hospitalization she is currently at [facility] for rehab purposes and duration of stay is unknown at this time.  Patient's daughter states that they were asked to  follow-up with cardiology due to an abnormal echocardiogram.  Patient is a poor historian as she states the current year is 2022, does not know her location, is unaware of the [place], is alert to herself and date of birth.  History of present illness is predominantly obtained by her daughter during today's encounter.  Patient is predominantly wheelchair-bound so unable to ascertain if she has any exertional symptoms.  She does have bilateral lower extremity swelling.  Medication list in the computer states that she is on torsemide but the medication list that they bring from the facility does not include any diuretic.  She is also on amlodipine 5 mg p.o. daily.  Current Medications: Current Meds  Medication Sig   acetaminophen (TYLENOL) 325 MG tablet Take 650 mg by mouth 3 (three) times daily.   albuterol (VENTOLIN HFA) 108 (90 Base) MCG/ACT inhaler Inhale 2 puffs into the lungs every 4 (four) hours as needed for wheezing or shortness of breath.   antiseptic oral rinse (BIOTENE) LIQD 10 mLs by Mouth Rinse route at bedtime.   atorvastatin (LIPITOR) 20 MG tablet Take 20 mg by mouth every evening.   benzonatate (TESSALON) 100 MG capsule Take 1 capsule (100 mg total) by mouth 3 (three) times daily as needed for cough.   BREO ELLIPTA 200-25 MCG/ACT AEPB Inhale 1 puff into the lungs daily.   Cholecalciferol (VITAMIN D) 50 MCG (2000 UT) tablet Take 2,000 Units by mouth daily.    clopidogrel (PLAVIX) 75 MG tablet Take 1 tablet (75 mg total) by mouth daily.   cyanocobalamin (VITAMIN B12) 500 MCG tablet Take 1 tablet (500 mcg  total) by mouth daily.   diclofenac Sodium (VOLTAREN) 1 % GEL Apply 2 g topically 4 (four) times daily as needed (Pain).   donepezil (ARICEPT) 10 MG tablet Take 10 mg by mouth at bedtime.   ferrous sulfate 325 (65 FE) MG tablet Take 1 tablet (325 mg total) by mouth 2 (two) times daily with a meal. (Patient taking differently: Take 325 mg by mouth daily.)    fluticasone (FLONASE) 50 MCG/ACT nasal spray Place 2 sprays into both nostrils daily.    guaiFENesin (MUCINEX) 600 MG 12 hr tablet Take 2 tablets (1,200 mg total) by mouth 2 (two) times daily.   hydrocortisone 2.5 % cream Apply 1 Application topically daily as needed (Irritation).   INCRUSE ELLIPTA 62.5 MCG/INH AEPB Inhale 1 puff into the lungs daily.    ipratropium-albuterol (DUONEB) 0.5-2.5 (3) MG/3ML SOLN Take 3 mLs by nebulization 3 (three) times daily.   lamoTRIgine (LAMICTAL) 100 MG tablet Take 1 tablet (100 mg total) by mouth 2 (two) times daily.   magnesium oxide (MAG-OX) 400 (241.3 Mg) MG tablet Take 400 mg by mouth daily.   montelukast (SINGULAIR) 10 MG tablet Take 10 mg by mouth at bedtime.   NYAMYC powder Apply 1 Application topically 2 (two) times daily as needed (Rash).   pantoprazole (PROTONIX) 40 MG tablet Take 1 tablet (40 mg total) by mouth 2 (two) times daily before a meal.   polyethylene glycol (MIRALAX / GLYCOLAX) 17 g packet Take 17 g by mouth 2 (two) times daily as needed for moderate constipation.   potassium chloride SA (KLOR-CON M) 20 MEQ tablet Take 20 mEq by mouth daily.   QUEtiapine (SEROQUEL) 50 MG tablet Take 1 tablet (50 mg total) by mouth at bedtime.   senna-docusate (SENOKOT-S) 8.6-50 MG per tablet Take 1 tablet by mouth 2 (two) times daily.   [DISCONTINUED] amLODipine (NORVASC) 5 MG tablet Take 5 mg by mouth daily.     Allergies:    Sulfa antibiotics, Iodine, Lactose intolerance (gi), Ms contin [morphine], Aminoglycosides, Aspirin, Codeine, Ivp dye [iodinated contrast media], Neomycin, Penicillins, Phenothiazines, Promethazine, and Tetracyclines & related   Past Medical History: Past Medical History:  Diagnosis Date   Arthritis    Bipolar 1 disorder (HCC)    Bronchitis    Chronic back pain    Constipation    COPD (chronic obstructive pulmonary disease) (HCC)    Dementia (HCC)    Diabetes mellitus    GERD (gastroesophageal reflux disease)    Hiatal  hernia    Hypertension    IBS (irritable bowel syndrome)    S/P insertion of spinal cord stimulator     Past Surgical History: Past Surgical History:  Procedure Laterality Date   BIOPSY  07/25/2023   Procedure: BIOPSY;  Surgeon: Hilarie Fredrickson, MD;  Location: [facility];  Service: Gastroenterology;;   ESOPHAGOGASTRODUODENOSCOPY (EGD) WITH PROPOFOL N/A 04/14/2020   Procedure: ESOPHAGOGASTRODUODENOSCOPY (EGD) WITH PROPOFOL;  Surgeon: Napoleon Form, MD;  Location: [facility];  Service: Endoscopy;  Laterality: N/A;   ESOPHAGOGASTRODUODENOSCOPY (EGD) WITH PROPOFOL N/A 07/25/2023   Procedure: ESOPHAGOGASTRODUODENOSCOPY (EGD) WITH PROPOFOL;  Surgeon: Hilarie Fredrickson, MD;  Location: [facility];  Service: Gastroenterology;  Laterality: N/A;   gallbladder     MANDIBLE SURGERY     PARTIAL HYSTERECTOMY      Social History: Social History   Tobacco Use   Smoking status: Never   Smokeless tobacco: Former    Types: Snuff    Quit date: 05/28/1959   Tobacco comments:  Pt used snuff for 33 years  Vaping Use   Vaping status: Never Used  Substance Use Topics   Alcohol use: No   Drug use: No    Family History: Family History  Problem Relation Age of Onset   Emphysema Mother    Cancer Other     ROS:   Review of Systems  Cardiovascular:  Negative for chest pain, claudication, irregular heartbeat, leg swelling, near-syncope, orthopnea, palpitations, paroxysmal nocturnal dyspnea and syncope.  Respiratory:  Negative for shortness of breath.   Hematologic/Lymphatic: Negative for bleeding problem.    EKGs/Labs/Other Studies Reviewed:   EKG: EKG Interpretation Date/Time:  Monday August 11 2023 11:23:29 EDT Ventricular Rate:  80 PR Interval:  172 QRS Duration:  82 QT Interval:  374 QTC Calculation: 431 R Axis:   -19  Text Interpretation: Normal sinus rhythm with sinus arrhythmia Possible Anterior infarct , age undetermined Nonspecific T wave abnormality When compared with ECG of  15-Jul-2023 14:52, No significant change since last tracing Confirmed by Tessa Lerner [phone]) on 08/11/2023 11:30:37 AM  Echocardiogram: 05/25/2023  1. Left ventricular ejection fraction, by estimation, is 65 to 70%. The  left ventricle has normal function. The left ventricle has no regional  wall motion abnormalities. There is mild left ventricular hypertrophy.  Left ventricular diastolic parameters  are consistent with Grade I diastolic dysfunction (impaired relaxation).   2. Right ventricular systolic function is normal. The right ventricular  size is normal. The estimated right ventricular systolic pressure is 25.3  mmHg.   3. Large pleural effusion.   4. The mitral valve is degenerative. Trivial mitral valve regurgitation.  Mild mitral stenosis. The mean mitral valve gradient is 7.0 mmHg with  average heart rate of 85 bpm.   5. The aortic valve is tricuspid. Aortic valve regurgitation is not  visualized. Severe aortic valve stenosis. Aortic valve area, by VTI measures 0.74 cm. Aortic valve mean gradient measures 47.0 mmHg. Aortic valve Vmax measures 3.89 m/s. Peak gradient 60.6 mmHg, DI 0.33.   6. The inferior vena cava is normal in size with <50% respiratory  variability, suggesting right atrial pressure of 8 mmHg.   Comparison(s): Changes from prior study are noted. 10/16/2019: LVEF 65-70%,  moderate AS- MG 35 mmHg, DI 0.30.    Labs:    Latest Ref Rng & Units 07/28/2023    4:02 AM 07/27/2023    5:20 AM 07/26/2023    4:24 AM  CBC  WBC 4.0 - 10.5 K/uL 6.4  5.5  6.0   Hemoglobin 12.0 - 15.0 g/dL 8.2  8.0  8.2   Hematocrit 36.0 - 46.0 % 26.8  26.0  26.6   Platelets 150 - 400 K/uL 176  210  218        Latest Ref Rng & Units 07/28/2023    4:02 AM 07/27/2023    5:20 AM 07/26/2023    4:24 AM  BMP  Glucose 70 - 99 mg/dL 77  91  027   BUN 8 - 23 mg/dL 21  19  16    Creatinine 0.44 - 1.00 mg/dL 2.53  6.64  4.03   Sodium 135 - 145 mmol/L 135  134  135   Potassium 3.5 - 5.1 mmol/L 3.8   3.5  3.7   Chloride 98 - 111 mmol/L 108  107  106   CO2 22 - 32 mmol/L 19  22  21    Calcium 8.9 - 10.3 mg/dL 7.6  7.6  7.6  Latest Ref Rng & Units 07/28/2023    4:02 AM 07/27/2023    5:20 AM 07/26/2023    4:24 AM  CMP  Glucose 70 - 99 mg/dL 77  91  409   BUN 8 - 23 mg/dL 21  19  16    Creatinine 0.44 - 1.00 mg/dL 8.11  9.14  7.82   Sodium 135 - 145 mmol/L 135  134  135   Potassium 3.5 - 5.1 mmol/L 3.8  3.5  3.7   Chloride 98 - 111 mmol/L 108  107  106   CO2 22 - 32 mmol/L 19  22  21    Calcium 8.9 - 10.3 mg/dL 7.6  7.6  7.6     No results found for: "CHOL", "HDL", "LDLCALC", "LDLDIRECT", "TRIG", "CHOLHDL" No results for input(s): "LIPOA" in the last 8760 hours. No components found for: "NTPROBNP" No results for input(s): "PROBNP" in the last 8760 hours. No results for input(s): "TSH" in the last 8760 hours.  Physical Exam:    Today's Vitals   08/11/23 1120  BP: (!) 120/58  Pulse: 80  Resp: 16  SpO2: (!) 89%  Weight: 142 lb (64.4 kg)  Height: 5' (1.524 m)   Body mass index is 27.73 kg/m. Wt Readings from Last 3 Encounters:  08/11/23 142 lb (64.4 kg)  07/16/23 139 lb 8.8 oz (63.3 kg)  05/28/23 154 lb 15.7 oz (70.3 kg)    Physical Exam  Constitutional: No distress. She appears chronically ill.  hemodynamically stable, in wheelchair.   Neck: JVD present.  Cardiovascular: Normal rate, regular rhythm, S1 normal and S2 normal. Exam reveals no gallop, no S3 and no S4.  No murmur heard. Pulmonary/Chest: Effort normal. No stridor. She has no wheezes. She has bibasilar rales.  Abdominal:  Foley catheter present.  Musculoskeletal:        General: Edema (Bilaterally up to the knees.) present.     Cervical back: Neck supple.     Comments: Wound over the left lower extremity.  Neurological:  Alert to herself and date of birth  Skin: Skin is warm and moist.   Impression & Recommendation(s):  Impression:   ICD-10-CM   1. Acute heart failure with preserved ejection  fraction (HFpEF) (HCC)  I50.31 Basic metabolic panel    Pro b natriuretic peptide (BNP)    Pro b natriuretic peptide (BNP)    Basic metabolic panel    torsemide (DEMADEX) 20 MG tablet    2. Severe aortic stenosis  I35.0 EKG 12-Lead    3. Benign hypertension  I10 Basic metabolic panel    Pro b natriuretic peptide (BNP)    Pro b natriuretic peptide (BNP)    Basic metabolic panel    4. Mixed hyperlipidemia  E78.2     5. Dementia  F03.90        Recommendation(s):  Acute heart failure with preserved ejection fraction (HFpEF) (HCC) Based on clinical symptoms and physical examination findings concerned that she has acute exacerbation of HFpEF. Check BMP, NT proBNP as baseline. Echocardiogram results reviewed. Discontinue amlodipine 5 mg p.o. daily. Start torsemide 20 mg p.o. daily Strict I's and O's, daily weights, low-salt diet Will focus on diuresis for now and then up titration of medical therapy to optimize her long-term. Exacerbated by underlying valvular heart disease, recent ESBL UTI/sepsis, other infectious etiologies as mentioned below, and acute blood loss anemia requiring endoscopy.  Severe aortic stenosis Referred to cardiology for to discuss management of her aortic stenosis. Based on her echocardiogram  from December 2024 she has severe aortic stenosis based on aortic valve area and mean gradient. Patient is not the best historian and I had a long discussion with her daughter who was present at today's office visit and who is the next of kin and states that she is Ms. Rosenau's POA. Prior to considering aortic valve intervention she needs to be optimized from a medical standpoint with uptitration of GDMT and diuresis as she is volume overloaded on physical examination. However long-term we need to make a decision with regards to considering aortic valve intervention vs. medical management.  From a general cardiology standpoint at the current time she is not an ideal  candidate for aortic valve replacement as my concerns stem from the fact that she has underlying dementia, frailty/weakness (not sure if symptomatically she would feel any better s/p valve intervention), recently treated for anemia status post endoscopy, and has had multiple infections which include ESBL UTI/sepsis, influenza A, community-acquired pneumonia, and her open wounds and the Foley in place predisposes her to recurrent infections/sepsis and potential nidus for bacteremia / endocarditis.  I would favor medical management for now for the reasons mentioned above.  I also informed her daughter that not addressing her aortic stenosis can potential lead to recurrent heart failure, chest pain, syncope, cardiomyopathy all of which can lead to worsening morbidities and mortality including death.  However, if the patient's daughter would like to proceed forward with aortic valve intervention I would first referring her to structural heart team for second opinion prior to subjecting the patient to invasive testing and CT scans. Patient's daughter states that she wants some time to think about this and also will discuss with the patient. This is quite reasonable and in the meantime would like to take the opportunity to diurese her and optimize her from a cardiovascular standpoint. Patient's daughter understands that this is not an easy decision given her underlying dementia, multiple infections which predisposes her to the possibility of endocarditis post valve replacement, and keeping in mind the procedural complications as well. Further recommendations to follow.  Benign hypertension Office blood pressures are acceptable. Discontinue amlodipine. Start torsemide as discussed above. Recommend strict I's and O's and daily weights, blood pressure checks, low-salt diet. Check blood work today and a week later to reevaluate therapy  Mixed hyperlipidemia Currently on Lipitor 20 mg p.o.  daily.  Dementia Chronic. Currently resides in [facility] and prior to that she was residing at [facility] care facility.   Orders Placed:  Orders Placed This Encounter  Procedures   Basic metabolic panel    Standing Status:   Future    Number of Occurrences:   1    Expected Date:   08/11/2023    Expiration Date:   08/10/2024   Pro b natriuretic peptide (BNP)    Standing Status:   Future    Number of Occurrences:   1    Expected Date:   08/11/2023    Expiration Date:   08/10/2024   Basic metabolic panel    Standing Status:   Future    Number of Occurrences:   1    Expected Date:   08/18/2023    Expiration Date:   08/10/2024   Pro b natriuretic peptide (BNP)    Standing Status:   Future    Number of Occurrences:   1    Expected Date:   08/18/2023    Expiration Date:   08/10/2024   EKG 12-Lead   Total time  spent: 65 minutes. Reviewed prior discharge summary from 07/28/2023, echo report from December 2025, discussed management of acute decompensated heart failure, medication adjustments, ordering additional labs, and given her dementia majority of HPI obtained by her daughter Darl Pikes, complex medical decision making given the clinical scenario and underlying aortic stenosis management.  Final Medication List:    Meds ordered this encounter  Medications   torsemide (DEMADEX) 20 MG tablet    Sig: Take 1 tablet (20 mg total) by mouth daily.    Dispense:  30 tablet    Refill:  3    Medications Discontinued During This Encounter  Medication Reason   torsemide (DEMADEX) 20 MG tablet Reorder   amLODipine (NORVASC) 5 MG tablet Discontinued by provider     Current Outpatient Medications:    acetaminophen (TYLENOL) 325 MG tablet, Take 650 mg by mouth 3 (three) times daily., Disp: , Rfl:    albuterol (VENTOLIN HFA) 108 (90 Base) MCG/ACT inhaler, Inhale 2 puffs into the lungs every 4 (four) hours as needed for wheezing or shortness of breath., Disp: , Rfl:    antiseptic oral rinse (BIOTENE)  LIQD, 10 mLs by Mouth Rinse route at bedtime., Disp: , Rfl:    atorvastatin (LIPITOR) 20 MG tablet, Take 20 mg by mouth every evening., Disp: , Rfl:    benzonatate (TESSALON) 100 MG capsule, Take 1 capsule (100 mg total) by mouth 3 (three) times daily as needed for cough., Disp: 20 capsule, Rfl: 0   BREO ELLIPTA 200-25 MCG/ACT AEPB, Inhale 1 puff into the lungs daily., Disp: , Rfl:    Cholecalciferol (VITAMIN D) 50 MCG (2000 UT) tablet, Take 2,000 Units by mouth daily. , Disp: , Rfl:    clopidogrel (PLAVIX) 75 MG tablet, Take 1 tablet (75 mg total) by mouth daily., Disp: , Rfl:    cyanocobalamin (VITAMIN B12) 500 MCG tablet, Take 1 tablet (500 mcg total) by mouth daily., Disp: 30 tablet, Rfl: 0   diclofenac Sodium (VOLTAREN) 1 % GEL, Apply 2 g topically 4 (four) times daily as needed (Pain)., Disp: , Rfl:    donepezil (ARICEPT) 10 MG tablet, Take 10 mg by mouth at bedtime., Disp: , Rfl:    ferrous sulfate 325 (65 FE) MG tablet, Take 1 tablet (325 mg total) by mouth 2 (two) times daily with a meal. (Patient taking differently: Take 325 mg by mouth daily.), Disp: 180 tablet, Rfl: 1   fluticasone (FLONASE) 50 MCG/ACT nasal spray, Place 2 sprays into both nostrils daily. , Disp: , Rfl:    guaiFENesin (MUCINEX) 600 MG 12 hr tablet, Take 2 tablets (1,200 mg total) by mouth 2 (two) times daily., Disp: 120 tablet, Rfl: 0   hydrocortisone 2.5 % cream, Apply 1 Application topically daily as needed (Irritation)., Disp: , Rfl:    INCRUSE ELLIPTA 62.5 MCG/INH AEPB, Inhale 1 puff into the lungs daily. , Disp: , Rfl:    ipratropium-albuterol (DUONEB) 0.5-2.5 (3) MG/3ML SOLN, Take 3 mLs by nebulization 3 (three) times daily., Disp: 270 mL, Rfl: 0   lamoTRIgine (LAMICTAL) 100 MG tablet, Take 1 tablet (100 mg total) by mouth 2 (two) times daily., Disp: , Rfl:    magnesium oxide (MAG-OX) 400 (241.3 Mg) MG tablet, Take 400 mg by mouth daily., Disp: , Rfl:    montelukast (SINGULAIR) 10 MG tablet, Take 10 mg by mouth at  bedtime., Disp: , Rfl:    NYAMYC powder, Apply 1 Application topically 2 (two) times daily as needed (Rash)., Disp: , Rfl:  pantoprazole (PROTONIX) 40 MG tablet, Take 1 tablet (40 mg total) by mouth 2 (two) times daily before a meal., Disp: , Rfl:    polyethylene glycol (MIRALAX / GLYCOLAX) 17 g packet, Take 17 g by mouth 2 (two) times daily as needed for moderate constipation., Disp: , Rfl:    potassium chloride SA (KLOR-CON M) 20 MEQ tablet, Take 20 mEq by mouth daily., Disp: , Rfl:    QUEtiapine (SEROQUEL) 50 MG tablet, Take 1 tablet (50 mg total) by mouth at bedtime., Disp: , Rfl:    senna-docusate (SENOKOT-S) 8.6-50 MG per tablet, Take 1 tablet by mouth 2 (two) times daily., Disp: , Rfl:    torsemide (DEMADEX) 20 MG tablet, Take 1 tablet (20 mg total) by mouth daily., Disp: 30 tablet, Rfl: 3  Consent:   NA  Disposition:   4-week follow-up with APP to reevaluate volume status and up titration of GDMT to optimize her fluid levels.  Follow-up with myself in 3 months sooner if needed  Her questions and concerns were addressed to her satisfaction. She voices understanding of the recommendations provided during this encounter.    Signed, Tessa Lerner, DO, [facility]  [facility]  [address] [place], [place] [address] 08/11/2023 7:46 PM

## 2023-08-11 NOTE — Patient Instructions (Signed)
 Medication Instructions:  Your physician has recommended you make the following change in your medication:   STOP Amlodipine (Norvasc)  START Torsemide 20 mg once daily    *If you need a refill on your cardiac medications before your next appointment, please call your pharmacy*  Lab Work: To be completed today: BMP and Pro-BNP  To be completed in 1 week: BMP and Pro-BNP  If you have labs (blood work) drawn today and your tests are completely normal, you will receive your results only by: MyChart Message (if you have MyChart) OR A paper copy in the mail If you have any lab test that is abnormal or we need to change your treatment, we will call you to review the results.  Testing/Procedures: None ordered today.  Follow-Up: At [facility], you and your health needs are our priority.  As part of our continuing mission to provide you with exceptional heart care, we have created designated Provider Care Teams.  These Care Teams include your primary Cardiologist (physician) and Advanced Practice Providers (APPs -  Physician Assistants and Nurse Practitioners) who all work together to provide you with the care you need, when you need it.  We recommend signing up for the patient portal called "MyChart".  Sign up information is provided on this After Visit Summary.  MyChart is used to connect with patients for Virtual Visits (Telemedicine).  Patients are able to view lab/test results, encounter notes, upcoming appointments, etc.  Non-urgent messages can be sent to your provider as well.   To learn more about what you can do with MyChart, go to [email or web address].    Your next appointment:   4 week(s)  The format for your next appointment:   In Person  Provider:   Jari Favre, PA-C, Ronie Spies, PA-C, Robin Searing, NP, Jacolyn Reedy, PA-C, Eligha Bridegroom, NP, Tereso Newcomer, PA-C, or Perlie Gold, PA-C. Then, [employer], DO will plan to see you again in 3 month(s).{  Other  Instructions Please try to stick to a low-salt diet.   Make sure to keep a strict log of intake and output measurements.  Be sure to take daily weights.

## 2023-08-18 LAB — LAB REPORT - SCANNED: EGFR: 88

## 2023-08-19 ENCOUNTER — Telehealth: Payer: Self-pay | Admitting: Cardiology

## 2023-08-19 NOTE — Telephone Encounter (Signed)
 Pt's daughter Darl Pikes is requesting a callback regarding her mother being in [facility] and about the labs requesting. She'd like to discuss further with a nurse. Please advise

## 2023-08-19 NOTE — Telephone Encounter (Signed)
 Camille called back and orders given to [facility] their nurse. They will draw tomorrow 08/20/23 and fax results to Dr Odis Hollingshead.

## 2023-08-19 NOTE — Telephone Encounter (Signed)
 Pts daughter asked that I call [facility] to advise [place] of the labs needed by Dr Odis Hollingshead... he has ordered a BMET and PRO BNP...Marland Kitchen pt has OV 09/15/23.   I left a message for Daphane Shepherd at [phone].

## 2023-09-08 ENCOUNTER — Other Ambulatory Visit: Payer: Self-pay

## 2023-09-08 DIAGNOSIS — I5031 Acute diastolic (congestive) heart failure: Secondary | ICD-10-CM

## 2023-09-08 MED ORDER — TORSEMIDE 20 MG PO TABS: 20.0000 mg | ORAL_TABLET | Freq: Two times a day (BID) | ORAL | 3 refills | Status: AC

## 2023-09-15 ENCOUNTER — Ambulatory Visit: Attending: Cardiology | Admitting: Cardiology

## 2023-09-25 ENCOUNTER — Telehealth: Payer: Self-pay

## 2023-09-25 NOTE — Telephone Encounter (Signed)
 Spoke with front desk staff to be transferred to pt's nurse to check on pt and remind facility that updated labs were needed and once completed needed to be faxed to our office. Pt was seen once by Dr. Albert Huff for care for HFpEF, severe aortic stenosis, HTN, and HLD. Was told by staff that the pt passed away at their facility on 21-Sep-2023. Will forward this message to medical records so they are aware.

## 2023-09-25 DEATH — deceased
